# Patient Record
Sex: Female | Born: 1975 | Race: Black or African American | Hispanic: No | Marital: Single | Smoking: Former smoker
Health system: Southern US, Community
[De-identification: ages and names within clinical notes are randomized; demographics above are authoritative.]

## PROBLEM LIST (undated history)

## (undated) ENCOUNTER — Ambulatory Visit

## (undated) DIAGNOSIS — E119 Type 2 diabetes mellitus without complications: Secondary | ICD-10-CM

## (undated) DIAGNOSIS — F3112 Bipolar disorder, current episode manic without psychotic features, moderate: Secondary | ICD-10-CM

## (undated) DIAGNOSIS — E109 Type 1 diabetes mellitus without complications: Secondary | ICD-10-CM

## (undated) DIAGNOSIS — K589 Irritable bowel syndrome without diarrhea: Secondary | ICD-10-CM

## (undated) DIAGNOSIS — F319 Bipolar disorder, unspecified: Secondary | ICD-10-CM

## (undated) DIAGNOSIS — D219 Benign neoplasm of connective and other soft tissue, unspecified: Secondary | ICD-10-CM

## (undated) DIAGNOSIS — IMO0002 Reserved for concepts with insufficient information to code with codable children: Secondary | ICD-10-CM

## (undated) DIAGNOSIS — R87619 Unspecified abnormal cytological findings in specimens from cervix uteri: Secondary | ICD-10-CM

## (undated) DIAGNOSIS — I10 Essential (primary) hypertension: Secondary | ICD-10-CM

## (undated) HISTORY — PX: ESSURE TUBAL LIGATION: SUR464

## (undated) HISTORY — PX: CERVICAL BIOPSY: SHX590

## (undated) HISTORY — PX: CHOLECYSTECTOMY: SHX55

## (undated) HISTORY — PX: HIATAL HERNIA REPAIR: SHX195

## (undated) HISTORY — PX: TUBAL LIGATION: SHX77

---

## 1999-03-17 ENCOUNTER — Emergency Department (HOSPITAL_COMMUNITY): Admission: EM | Admit: 1999-03-17 | Discharge: 1999-03-17 | Payer: Self-pay | Admitting: Emergency Medicine

## 1999-03-25 ENCOUNTER — Emergency Department (HOSPITAL_COMMUNITY): Admission: EM | Admit: 1999-03-25 | Discharge: 1999-03-25 | Payer: Self-pay | Admitting: Emergency Medicine

## 1999-10-31 ENCOUNTER — Encounter: Admission: RE | Admit: 1999-10-31 | Discharge: 1999-10-31 | Payer: Self-pay

## 2000-10-06 ENCOUNTER — Emergency Department (HOSPITAL_COMMUNITY): Admission: EM | Admit: 2000-10-06 | Discharge: 2000-10-07 | Payer: Self-pay | Admitting: Emergency Medicine

## 2002-04-21 ENCOUNTER — Emergency Department (HOSPITAL_COMMUNITY): Admission: EM | Admit: 2002-04-21 | Discharge: 2002-04-21 | Payer: Self-pay | Admitting: *Deleted

## 2002-04-21 ENCOUNTER — Encounter: Payer: Self-pay | Admitting: Emergency Medicine

## 2002-04-27 ENCOUNTER — Inpatient Hospital Stay (HOSPITAL_COMMUNITY): Admission: AD | Admit: 2002-04-27 | Discharge: 2002-04-27 | Payer: Self-pay | Admitting: Obstetrics and Gynecology

## 2002-04-28 ENCOUNTER — Encounter: Payer: Self-pay | Admitting: Obstetrics and Gynecology

## 2003-01-06 ENCOUNTER — Other Ambulatory Visit: Admission: RE | Admit: 2003-01-06 | Discharge: 2003-01-06 | Payer: Self-pay | Admitting: Obstetrics and Gynecology

## 2003-01-07 ENCOUNTER — Other Ambulatory Visit: Admission: RE | Admit: 2003-01-07 | Discharge: 2003-01-07 | Payer: Self-pay | Admitting: Obstetrics & Gynecology

## 2003-07-26 ENCOUNTER — Emergency Department (HOSPITAL_COMMUNITY): Admission: AD | Admit: 2003-07-26 | Discharge: 2003-07-26 | Payer: Self-pay | Admitting: Emergency Medicine

## 2003-08-09 ENCOUNTER — Inpatient Hospital Stay (HOSPITAL_COMMUNITY): Admission: AD | Admit: 2003-08-09 | Discharge: 2003-08-11 | Payer: Self-pay | Admitting: Obstetrics and Gynecology

## 2004-09-27 ENCOUNTER — Inpatient Hospital Stay (HOSPITAL_COMMUNITY): Admission: RE | Admit: 2004-09-27 | Discharge: 2004-10-03 | Payer: Self-pay | Admitting: Psychiatry

## 2004-09-27 ENCOUNTER — Ambulatory Visit: Payer: Self-pay | Admitting: Psychiatry

## 2004-09-28 ENCOUNTER — Encounter: Payer: Self-pay | Admitting: Psychiatry

## 2005-03-09 ENCOUNTER — Emergency Department (HOSPITAL_COMMUNITY): Admission: EM | Admit: 2005-03-09 | Discharge: 2005-03-10 | Payer: Self-pay | Admitting: Emergency Medicine

## 2005-03-11 ENCOUNTER — Emergency Department (HOSPITAL_COMMUNITY): Admission: EM | Admit: 2005-03-11 | Discharge: 2005-03-12 | Payer: Self-pay | Admitting: Emergency Medicine

## 2005-03-13 ENCOUNTER — Other Ambulatory Visit: Admission: RE | Admit: 2005-03-13 | Discharge: 2005-03-13 | Payer: Self-pay | Admitting: Obstetrics and Gynecology

## 2007-01-17 ENCOUNTER — Emergency Department (HOSPITAL_COMMUNITY): Admission: EM | Admit: 2007-01-17 | Discharge: 2007-01-17 | Payer: Self-pay | Admitting: Emergency Medicine

## 2007-01-21 ENCOUNTER — Ambulatory Visit (HOSPITAL_COMMUNITY): Admission: RE | Admit: 2007-01-21 | Discharge: 2007-01-21 | Payer: Self-pay | Admitting: Obstetrics

## 2007-02-04 ENCOUNTER — Ambulatory Visit (HOSPITAL_COMMUNITY): Admission: RE | Admit: 2007-02-04 | Discharge: 2007-02-04 | Payer: Self-pay | Admitting: Obstetrics

## 2007-04-26 ENCOUNTER — Inpatient Hospital Stay (HOSPITAL_COMMUNITY): Admission: AD | Admit: 2007-04-26 | Discharge: 2007-04-26 | Payer: Self-pay | Admitting: Obstetrics

## 2007-06-16 ENCOUNTER — Inpatient Hospital Stay (HOSPITAL_COMMUNITY): Admission: AD | Admit: 2007-06-16 | Discharge: 2007-06-18 | Payer: Self-pay | Admitting: Obstetrics

## 2007-06-16 ENCOUNTER — Encounter: Payer: Self-pay | Admitting: Obstetrics

## 2007-12-31 ENCOUNTER — Emergency Department (HOSPITAL_COMMUNITY): Admission: EM | Admit: 2007-12-31 | Discharge: 2007-12-31 | Payer: Self-pay | Admitting: Family Medicine

## 2008-01-17 ENCOUNTER — Ambulatory Visit: Payer: Self-pay | Admitting: Nurse Practitioner

## 2008-01-17 DIAGNOSIS — J029 Acute pharyngitis, unspecified: Secondary | ICD-10-CM | POA: Insufficient documentation

## 2008-01-17 DIAGNOSIS — M542 Cervicalgia: Secondary | ICD-10-CM | POA: Insufficient documentation

## 2008-01-17 DIAGNOSIS — H6120 Impacted cerumen, unspecified ear: Secondary | ICD-10-CM | POA: Insufficient documentation

## 2008-01-17 LAB — CONVERTED CEMR LAB: Rapid Strep: NEGATIVE

## 2008-02-23 ENCOUNTER — Encounter: Payer: Self-pay | Admitting: Emergency Medicine

## 2008-02-24 ENCOUNTER — Ambulatory Visit: Payer: Self-pay | Admitting: Psychiatry

## 2008-02-24 ENCOUNTER — Inpatient Hospital Stay (HOSPITAL_COMMUNITY): Admission: EM | Admit: 2008-02-24 | Discharge: 2008-03-01 | Payer: Self-pay | Admitting: Psychiatry

## 2008-05-10 ENCOUNTER — Emergency Department (HOSPITAL_COMMUNITY): Admission: EM | Admit: 2008-05-10 | Discharge: 2008-05-11 | Payer: Self-pay | Admitting: Emergency Medicine

## 2008-06-10 ENCOUNTER — Ambulatory Visit: Payer: Self-pay | Admitting: Nurse Practitioner

## 2008-06-10 DIAGNOSIS — K8020 Calculus of gallbladder without cholecystitis without obstruction: Secondary | ICD-10-CM | POA: Insufficient documentation

## 2008-06-11 ENCOUNTER — Ambulatory Visit (HOSPITAL_COMMUNITY): Admission: RE | Admit: 2008-06-11 | Discharge: 2008-06-11 | Payer: Self-pay | Admitting: Internal Medicine

## 2008-06-12 DIAGNOSIS — K819 Cholecystitis, unspecified: Secondary | ICD-10-CM | POA: Insufficient documentation

## 2008-08-14 ENCOUNTER — Encounter (INDEPENDENT_AMBULATORY_CARE_PROVIDER_SITE_OTHER): Payer: Self-pay | Admitting: General Surgery

## 2008-08-14 ENCOUNTER — Ambulatory Visit (HOSPITAL_COMMUNITY): Admission: RE | Admit: 2008-08-14 | Discharge: 2008-08-15 | Payer: Self-pay | Admitting: General Surgery

## 2009-10-19 ENCOUNTER — Encounter (INDEPENDENT_AMBULATORY_CARE_PROVIDER_SITE_OTHER): Payer: Self-pay | Admitting: Family Medicine

## 2010-08-02 NOTE — Letter (Signed)
Summary: PATIENT INFORMATION & HISTORY SHEET  PATIENT INFORMATION & HISTORY SHEET   Imported By: Arta Bruce 01/20/2008 09:36:40  _____________________________________________________________________  External Attachment:    Type:   Image     Comment:   External Document

## 2010-08-02 NOTE — Assessment & Plan Note (Signed)
Summary: NEW - Pharyngitis   Vital Signs:  Patient Profile:   35 Years Old Female LMP:     12/18/2007 Height:     70.25 inches Weight:      279 pounds BMI:     39.89 BSA:     2.41 Temp:     98.2 degrees F oral Pulse rate:   96 / minute Pulse rhythm:   regular Resp:     20 per minute BP sitting:   110 / 60  (right arm) Cuff size:   regular  Pt. in pain?   yes    Location:   neck    Intensity:   4  Vitals Entered By: Levon Hedger (January 17, 2008 4:00 PM)  Menstrual History: LMP (date): 12/18/2007 LMP - Character: normal              Is Patient Diabetic? No  Does patient need assistance? Ambulation Normal Comments pt states she went to chiropractor today.   Last PAP Date 2/08   Chief Complaint:  new establish sore throat painful.  History of Present Illness:  Pt into the office to establish care. Pt has been seeing Dr. Clearance Coots - GYN.   She gave birth in 12/08. PAP up to date. Done at GYN 2 children. s/p tubal ligation  Neck pain - pt was seen in the chiropractor today. pt states that she pulled a muscle in her neck.  Heat therapy and massage done. No trauma.   Pt referred herself.  Sore Throat - itchy, cough, sore at times.  Previous hx of strep throat. -fever +ear ache - drainage  -nasal congestion symptoms recurrent for past 5-6 months. She mainly noticed in the winter months. Recent urgent care visit for similar complaints     Updated Prior Medication List: No Medications Current Allergies (reviewed today): No known allergies   Past Surgical History:    Tubal ligation   Family History:    Family History Diabetes 1st degree relative - paternal grandmother    Family History Thyroid disease - sister    Family History of Prostate CA 1st degree relative <50 - father  Social History:    2 children - natural    married    Current Smoker (5 cigs/wk)    Alcohol use-yes (social)    Drug use-no   Risk Factors:  Tobacco use:  current  Counseled to quit/cut down tobacco use:  yes Drug use:  no Caffeine use:  0 drinks per day Alcohol use:  yes Exercise:  no Seatbelt use:  100 % Sun Exposure:  occasionally  Family History Risk Factors:    Family History of MI in females < 75 years old:  no    Family History of MI in males < 15 years old:  no   Review of Systems  ENT      Complains of decreased hearing, ear discharge, earache, and sore throat.      Denies hoarseness and nasal congestion.  CV      Denies chest pain or discomfort.  Resp      Denies cough.   Physical Exam  General:     alert and overweight-appearing.   Head:     normocephalic.   Eyes:     pupils equal and pupils round.   Ears:     bil cerumen impaction prior to procedure able to visualize TM after irrigation Mouth:     tonsil enlargment +1 pharynx pink and moist.   Lungs:  normal breath sounds.   Heart:     normal rate and regular rhythm.   Abdomen:     soft, non-tender, and normal bowel sounds.   Msk:     up to exam table Neurologic:     alert & oriented X3.   Psych:     Oriented X3.      Impression & Recommendations:  Problem # 1:  PHARYNGITIS, VIRAL (ICD-462) rapid strep negative handout given Her updated medication list for this problem includes:    Ibuprofen 600 Mg Tabs (Ibuprofen) .Marland Kitchen... 1 tablet by mouth two times a day as needed  Orders: Rapid Strep (28413)   Problem # 2:  CERUMEN IMPACTION, BILATERAL (ICD-380.4) bil ears irrigated. tolerated procedure well Orders: Cerumen Impaction Removal (24401)   Complete Medication List: 1)  Ibuprofen 600 Mg Tabs (Ibuprofen) .Marland Kitchen.. 1 tablet by mouth two times a day as needed 2)  Loratadine 10 Mg Tabs (Loratadine) .Marland Kitchen.. 1 tablet by mouth daily as needed for allergies   Patient Instructions: 1)  Follow up as needed   Prescriptions: LORATADINE 10 MG  TABS (LORATADINE) 1 tablet by mouth daily as needed for allergies  #30 x 1   Entered and Authorized by:    Lehman Prom FNP   Signed by:   Lehman Prom FNP on 01/17/2008   Method used:   Print then Give to Patient   RxID:   [id number] IBUPROFEN 600 MG  TABS (IBUPROFEN) 1 tablet by mouth two times a day as needed  #30 x 0   Entered and Authorized by:   Lehman Prom FNP   Signed by:   Lehman Prom FNP on 01/17/2008   Method used:   Print then Give to Patient   RxID:   [id number]  ] Laboratory Results  Date/Time Received: January 17, 2008 5:26 PM   Other Tests  Rapid Strep: negative

## 2010-08-02 NOTE — Letter (Signed)
Summary: [facility]  [facility]   Imported By: Arta Bruce 12/31/2009 14:52:46  _____________________________________________________________________  External Attachment:    Type:   Image     Comment:   External Document

## 2010-10-18 LAB — DIFFERENTIAL
Basophils Absolute: 0 10*3/uL (ref 0.0–0.1)
Basophils Relative: 1 % (ref 0–1)
Eosinophils Absolute: 0.1 10*3/uL (ref 0.0–0.7)
Eosinophils Relative: 2 % (ref 0–5)
Lymphocytes Relative: 33 % (ref 12–46)
Lymphs Abs: 1.5 10*3/uL (ref 0.7–4.0)
Monocytes Absolute: 0.3 10*3/uL (ref 0.1–1.0)
Monocytes Relative: 7 % (ref 3–12)
Neutro Abs: 2.6 10*3/uL (ref 1.7–7.7)
Neutrophils Relative %: 58 % (ref 43–77)

## 2010-10-18 LAB — COMPREHENSIVE METABOLIC PANEL
ALT: 21 U/L (ref 0–35)
AST: 18 U/L (ref 0–37)
Albumin: 3.8 g/dL (ref 3.5–5.2)
Alkaline Phosphatase: 65 U/L (ref 39–117)
BUN: 5 mg/dL — ABNORMAL LOW (ref 6–23)
CO2: 27 mEq/L (ref 19–32)
Calcium: 9.9 mg/dL (ref 8.4–10.5)
Chloride: 107 mEq/L (ref 96–112)
Creatinine, Ser: 0.76 mg/dL (ref 0.4–1.2)
GFR calc non Af Amer: >60 mL/min (ref >60)
Glucose, Bld: 105 mg/dL — ABNORMAL HIGH (ref 70–99)
Potassium: 4.8 mEq/L (ref 3.5–5.1)
Sodium: 141 mEq/L (ref 135–145)
Total Bilirubin: 1.2 mg/dL (ref 0.3–1.2)
Total Protein: 6.5 g/dL (ref 6.0–8.3)

## 2010-10-18 LAB — CBC
HCT: 36.8 % (ref 36.0–46.0)
Hemoglobin: 12.3 g/dL (ref 12.0–15.0)
MCHC: 33.3 g/dL (ref 30.0–36.0)
MCV: 82.3 fL (ref 78.0–100.0)
Platelets: 280 10*3/uL (ref 150–400)
RBC: 4.47 MIL/uL (ref 3.87–5.11)
RDW: 13.4 % (ref 11.5–15.5)
WBC: 4.5 10*3/uL (ref 4.0–10.5)

## 2010-10-18 LAB — URINALYSIS, ROUTINE W REFLEX MICROSCOPIC
Bilirubin Urine: NEGATIVE
Glucose, UA: NEGATIVE mg/dL
Hgb urine dipstick: NEGATIVE
Ketones, ur: 15 mg/dL — AB
Nitrite: NEGATIVE
Protein, ur: NEGATIVE mg/dL
Specific Gravity, Urine: 1.026 (ref 1.005–1.030)
Urobilinogen, UA: 1 mg/dL (ref 0.0–1.0)
pH: 6 (ref 5.0–8.0)

## 2010-10-18 LAB — URINE MICROSCOPIC-ADD ON

## 2010-11-15 NOTE — Op Note (Signed)
NAME:  [name], [name]                  ACCOUNT NO.:  [id number]   MEDICAL RECORD NO.:  [id number]          PATIENT TYPE:  INP   LOCATION:  9148                          FACILITY:  [facility]   PHYSICIAN:  Charles A. Clearance Coots, M.D.DATE OF BIRTH:  04-20-1976   DATE OF PROCEDURE:  06/16/2007  DATE OF DISCHARGE:                               OPERATIVE REPORT   PREOPERATIVE DIAGNOSIS:  Desires sterilization.   POSTOPERATIVE DIAGNOSIS:  Desires sterilization.   PROCEDURE:  Bilateral partial salpingectomy.   SURGEON:  Coral Ceo, M.D.   ANESTHESIA:  Spinal.   ESTIMATED BLOOD LOSS:  Negligible.   COMPLICATIONS:  None.   SPECIMEN:  Approximately 2 cm segments of right and left fallopian tube.   DISPOSITION OF SPECIMEN:  Pathology.   OPERATION:  The patient was brought to operating room and after  satisfactory spinal anesthesia, the abdomen was prepped and draped in  the usual sterile fashion.  A small inferior incision was made with a  scalpel that was deepened down to the fascia with curved Mayo scissors  bluntly.  The fascia was grasped in the midline with Kocher forceps and  was cut transversely with curved Mayo scissors.  The fascial  incision  was extended to left and to right with the curved Mayo scissors.  The  peritoneum was entered bluntly with the right-angle retractors.  The  right fallopian tube was identified and was grasped with Babcock clamp  in the isthmic portion of the tube was followed from the cornual and to  the fimbrial end and grasped with Babcock clamp.  Knuckle of tube  beneath the Babcock clamp in the isthmic portion of tube was suture  ligated through the mesosalpinx with 0 plain catgut.  The section of  tube above the knot was excised with Metzenbaum scissors and submitted  to pathology for evaluation.  There is no active bleeding from the tubal  stumps and they were therefore placed back in the normal anatomic  position.  Same procedure was performed on the  opposite side without  complications.  The abdomen was then closed as follows.  The fascia and  peritoneum was closed as one with a continuous suture of 2-0 Vicryl.  The skin was closed with a  continuous subcuticular suture of 3-0 Monocryl.  Sterile bandage was  applied to the incision closure.  Surgical technician indicated that all  needle, sponge and instrument counts were correct.  The patient  tolerated the procedure well and was transported to recovery in  satisfactory condition.      Charles A. Clearance Coots, M.D.  Electronically Signed     CAH/MEDQ  D:  06/16/2007  T:  06/17/2007  Job:  [id number]

## 2010-11-15 NOTE — Op Note (Signed)
NAME:  [name], [name] NO.:  [id number]   MEDICAL RECORD NO.:  [id number]          PATIENT TYPE:  OIB   LOCATION:  5121                         FACILITY:  [facility]   PHYSICIAN:  Angelia Mould. Derrell Lolling, M.D.DATE OF BIRTH:  07-Jun-1976   DATE OF PROCEDURE:  DATE OF DISCHARGE:                               OPERATIVE REPORT   PREOPERATIVE DIAGNOSES:  Chronic cholecystitis with cholelithiasis.   POSTOPERATIVE DIAGNOSIS:  Chronic cholecystitis with cholelithiasis.   OPERATION PERFORMED:  Laparoscopic cholecystectomy with intraoperative  cholangiogram.   SURGEON:  Angelia Mould. Derrell Lolling, MD   FIRST ASSISTANT:  Ollen Gross. Vernell Morgans, MD   OPERATIVE INDICATIONS:  This is a 35 year old black female who has a 3-  year history of intermittent episodes of postprandial right upper  quadrant pain and fatty food intolerance.  This has been getting worse.  She sometimes has nausea and vomiting.  She was recently evaluated at  the [facility] where a gallbladder ultrasound showed a  small contracting gallbladder filled with stones, but her lab work was  all normal.  She went to [facility] and then was sent to me  in my office.  I counseled her as an outpatient.  Cholecystectomy was  scheduled and she was brought to the operating room electively.   OPERATIVE FINDINGS:  The gallbladder was chronically inflamed, had  moderate adhesions to it.  These adhesions were soft.  The gallbladder  had numerous large and small stones in it.  The wall of the gallbladder  was thin.  The cholangiogram was normal showing normal intrahepatic and  extrahepatic bile ducts, no filling defects, normal to slightly small-  caliber bile ducts, and no obstruction with good flow of contrast into  the duodenum.  She had some filmy chronic adhesions from the dome of the  right lobe of the liver to the diaphragm which were taken down.  She had  some adhesions in the left upper quadrant as well.  The  pelvis looked  normal.  The small bowel and large bowel looked normal.   OPERATIVE TECHNIQUE:  Following the induction of general endotracheal  anesthesia, the patient's abdomen was prepped and draped in a sterile  fashion.  Intravenous antibiotics were given.  The patient was  identified as the correct patient, correct procedure, and a surgical  time-out was held.  Marcaine 0.5% with epinephrine was used as a local  infiltration anesthetic.  A curved transverse incision was made at the  superior rim of the umbilicus.  The fascia was incised in the midline  and the abdominal cavity entered under direct vision.  A 10-mm Hasson  trocar was inserted and secured with a pursestring suture of 0 Vicryl.  Pneumoperitoneum was created.  Video camera was inserted with  visualization and findings as described above.  An 11-mm trocar was  placed in the subxiphoid region and two 5-mm trocars were placed in the  right upper quadrant.  The adhesions between the dome of the liver in  the right hemidiaphragm were taken down sharply with scissors.  The  gallbladder fundus  was identified and elevated.  We identified a very  prominent cystic artery going up onto the wall of the gallbladder and  associated with lymph node.  This was isolated, clamped, divided, and  ligated with multiple metal clips.  We then dissected out the peritoneum  away from the right and left lower parts of the gallbladder until we  identified a posterior branch of the cystic artery.  This was also  isolated, secured with multiple metal clips, and divided.  We then  isolated the cystic duct which was quite long.  We inserted a  cholangiogram catheter into the cystic duct and performed a  cholangiogram using C-arm.  The cholangiogram was normal as described  above.  We removed the cholangiogram catheter, secured the cystic duct  with multiple metal clips and divided it.  We then dissected the  gallbladder from its bed with  electrocautery, placed it in the specimen  bag, and removed it through the umbilical port.  The operative field and  the subphrenic space were then copiously irrigated with saline.  There  was no bleeding.  There was no bile leak whatsoever.  The irrigation  fluid was completely clear.  The trocars were removed under direct  vision.  There was no bleeding from the trocar sites.  The  pneumoperitoneum was released.  The fascia at the umbilicus was closed  with 0 Vicryl sutures.  The skin incisions were closed with subcuticular  sutures of 4-0 Monocryl and Steri-Strips.  Clean bandages were placed,  and the patient taken to recovery room in stable condition.  Estimated  blood loss was about 15-20 mL.  Complications none.  Sponge, needle, and  instrument counts were correct.      Angelia Mould. Derrell Lolling, M.D.  Electronically Signed     HMI/MEDQ  D:  08/14/2008  T:  08/15/2008  Job:  [id number]   cc:   [facility]

## 2010-11-18 NOTE — Discharge Summary (Signed)
NAMESAHALIE, BETH NO.:  [id number]   MEDICAL RECORD NO.:  [id number]          PATIENT TYPE:  IPS   LOCATION:  0405                          FACILITY:  [facility]   PHYSICIAN:  Geoffery Lyons, M.D.      DATE OF BIRTH:  [birth year]   DATE OF ADMISSION:  09/27/2004  DATE OF DISCHARGE:  10/03/2004                                 DISCHARGE SUMMARY   CHIEF COMPLAINT AND PRESENT ILLNESS:  This was the first admission to [facility] for this 35 year old married African-American female  voluntarily admitted. Referred by the patient's primary care practitioner,  the patient's gynecologist, after she presented at his office for routine  followup appointment for elective termination of pregnancy. She was grossly  disorganized, unable to state her name at the time of registration, barely  responsive although fully conscious, looking around, speech disorganized and  not making sense. She admitted to smoking marijuana the night before. The  family stated that they had never seen her like this, and she was referred  for admission.   PAST PSYCHIATRIC HISTORY:  First time at [facility]. No previous  psychiatric care.   ALCOHOL AND DRUG HISTORY:  Admitted to the use of marijuana recently before  this episode.   PAST MEDICAL HISTORY:  Status post elective termination of pregnancy.   MEDICATIONS:  None.   PHYSICAL EXAMINATION:  Physical exam performed, did not show any acute  findings.   LABORATORY DATA:  CBC within normal limits. Blood chemistry:  Potassium 3.4,  glucose 150, SGOT 25, SGPT 37, total bilirubin 1.6, TSH 0.485. Urine  pregnancy test negative. Drug screen positive for marijuana.   MENTAL STATUS EXAM:  Reveals a female who was cooperative and directable but  had a perplexed affect. Speech was __________ , normal tone and pace. Some  response latency.  Appeared to be searching for word, decreasing amount.  Mood was anxious. Thought process was  disorganized. Seemed to be internally  distracted, unable to state her name, the date, and the day or the location.  Very unreliable. Unable to answer all of the questions. She drove herself to  the hospital, so she seemed to have been able to coordinate some actions.   ADMISSION DIAGNOSES:   AXIS I:  1.  Psychotic disorder, not otherwise specified.  2.  Marijuana abuse.   AXIS II:  No diagnosis.   AXIS III:  No diagnosis.   AXIS IV:  Moderate.   AXIS V:  Global Assessment of Functioning upon admission 20/25, highest  Global Assessment of Functioning in the last year 65/70.   COURSE IN THE HOSPITAL:  She was admitted and started in individual and  group psychotherapy. She was given Ativan as needed for anxiety. She was  also given Zyprexa Zydis 5 every 6 hours as needed. Zyprexa was  discontinued, and she was placed on Geodon 40 twice a day. Initially, she  was pretty disorganized, confused. She was placed one on one for safety. She  was very vague in terms of the history, some inconsistencies. She was  somewhat reserved,  somewhat guarded. Long pauses, seemed to be responding to  internal stimuli. By March 31, she was more aware, more cognizant of what  was going on. Endorsed that she was under a lot of stress. Endorsed that she  was trying to get the tax information together; however, overwhelmed as she  was very disorganized. She endorsed that she has not been herself lately the  last few weeks. No underlying paranoia. Endorsed that she felt there was  some people behind this.  Anxious when talking about the taxes. She was able  to disclose her history of moodiness, stability, some promiscuous behaviors.  Seemed to be more stable. There was a session with the mother and sister.  Endorsed feeling tired, worrying. They did show a lot of support. She seemed  to be improving. Affect became brighter, broader. She was more on target,  more in focus. She was able to talk about some of  the issues. So, overall,  her mood improved. Her affect was brighter. There was no evidence of the  thought disorganization that she was experiencing before. We went ahead and  discharged her to outpatient therapy.   DISCHARGE DIAGNOSES:   AXIS I:  Psychotic disorder, not otherwise specified. Rule out bipolar  disorder with psychosis and marijuana abuse.   AXIS II:  No diagnosis.   AXIS III:  No diagnosis   AXIS IV:  Moderate.   AXIS V:  Global Assessment of Functioning upon discharge 50/55.   DISCHARGE MEDICATIONS:  Geodon 80 mg twice a day.   FOLLOW UP:  __________ [facility].      IL/MEDQ  D:  10/25/2004  T:  10/26/2004  Job:  [id number]

## 2010-11-18 NOTE — H&P (Signed)
[name], [name] NO.:  [id number]   MEDICAL RECORD NO.:  [id number]          PATIENT TYPE:  IPS   LOCATION:  0405                          [facility]   PHYSICIAN:  [name], M.D.      DATE OF BIRTH:  1975/10/06   DATE OF ADMISSION:  09/27/2004  DATE OF DISCHARGE:                         PSYCHIATRIC ADMISSION ASSESSMENT   IDENTIFYING INFORMATION:  This is a 35 year old married African-American  female.  This is a voluntary admission.   HISTORY OF PRESENT ILLNESS:  This patient was referred to the hospital by  the patient's primary care practitioner, [name], M.D. the patient's  gynecologist, after she presented at his office today for a routine followup  appointment after an elective termination of pregnancy within the last  couple of weeks.  At the time she presented she was grossly disorganized.  She was unable to state her name at the time of registration, variably  responsive, although she was fully conscious, looking around, speech  disorganized and not making sense.  They had asked her if she had used any  substances and she admitted to having smoked some marijuana the prior night.  They had never seen her like this and referred her for admission.  At the  time of admission she was fully alert, calm, directable but again completely  disorganized.  She admits to hearing some auditory hallucinations.  When  asked if they are giving her instructions to harm herself, she says yes.  Then went asked to specify the instructions she describes commands to  perform ADL activities such as get dressed, go to the shower and such.  She  admits to drinking several shots of alcohol in the evenings and does endorse  smoking some marijuana.  She appears internally stimulated.   PAST PSYCHIATRIC HISTORY:  This is the first known admission to [facility], although the staff state that they have recognized  her we are unable to locate any  prior history in the records, and Dr. Tenny Craw,  who we have contacted, has no prior history of any psychiatric care.   SOCIAL HISTORY:  Patient endorses being married but is unable to give any  other history.  She has no children that her physician is aware of.  Apparently no legal problems.   FAMILY HISTORY:  Not available.   ALCOHOL OR DRUG HISTORY:  As noted above.   MEDICAL HISTORY:  Patient is followed by Dr. Miguel [name].   MEDICAL PROBLEMS:  Includes status post elective termination of pregnancy.   PAST MEDICAL HISTORY:  Remarkable for history of sexually transmitted  diseases.  She has a history of taking Paxil at one point in the past,  denies that she is currently taking it.  She denies seizures or other  surgeries.  She denies other hospitalizations.   MEDICATIONS:  None known.   DRUG ALLERGIES:  She denies.   POSITIVE PHYSICAL FINDINGS:  Because of her psychosis she tolerates only a  limited physical exam.  LUNGS:  Clear to auscultation.  CARDIOVASCULAR:  S1 and S2 are heard, regular  rate and rhythm for 1 full  minute, apical pulse synchronous with radial pulse.  VITAL SIGNS:  At the time of admission she is 5 feet 11 inches tall, weight  262 lbs, blood pressure 138/96, respirations 20.  She is afebrile with  temperature of 98.3.  NEUROLOGIC:  Reveals cranial nerves II-XII are intact.  Positive facial and  motor symmetry are present.  Grip strengths are equal bilaterally.  Her gait  is normal with normal arm swing, no tremor, flushing or nystagmus.  No signs  of substance withdrawal.  Neuro is nonfocal.   DIAGNOSTIC STUDIES:  Labs are currently pending.   MENTAL STATUS EXAM:  This is a fully alert female who is cooperative,  directable with a perplexed affect.  Speech is irrelevant, normal tone,  normal pace.  She does have considerable response latency.  She appeared to  be searching for words.  Decreased in amount.  Mood is euthymic.  Thought  process disorganized.   She does appear to be internally distracted.  Cognitively, she is unable to state her name, the date, the day or the  location, although there is no evidence of overt suicidal or homicidal  thought.  She is an unreliable historian.  Specifically during conversation,  she is asked what doctor's office she was at this morning.  She responded  after a long pause.  She begins to say numbers as if she is giving an  address.  When asked what day it is, she looks around, stammers and is  unable to answer.  It is noted that she did drive herself over here, so was  able to follow directions.   IMPRESSION:   AXIS I:  1.  Psychosis, not otherwise specified, rule out alcohol abuse and      dependence.  2.  Cannabis abuse.   AXIS II:  No diagnosis.   AXIS III:  No diagnosis.   AXIS IV:  Deferred.   AXIS V:  15-20, past year 65 estimated.   PLAN:  Voluntarily admit the patient with every 15 minutes checks in place.  We have admitted her to our intensive care unit and will place her on one-to-  one observation because of her disabled thought processing.  We will obtain  a CT scan of her brain with and without contrast medium.  We have started  her on 1 mg of Ativan p.o. now then q.4h. p.r.n. for agitation.  We are  placing her on __________ q.6h. while awake, and we will give her Zyprexa  Zydis 5 mg q.h.s. and q.6h. runny nose for hallucinations or agitation not  to exceed 20 mg in 24 hours.  Meanwhile we will complete basic labs and get  an acetaminophen and a salicylate level, basic electrolytes and we ill go  from there, and attempt to contact her family.  Estimated length of stay is  5 days.     MAS/MEDQ  D:  09/28/2004  T:  09/28/2004  Job:  [id number]

## 2010-11-18 NOTE — Discharge Summary (Signed)
NAMELENAY, LOVEJOY NO.:  [id number]   MEDICAL RECORD NO.:  [id number]          PATIENT TYPE:  IPS   LOCATION:  0400                          FACILITY:  [facility]   PHYSICIAN:  Anselm Jungling, MD  DATE OF BIRTH:  03-05-1976   DATE OF ADMISSION:  02/24/2008  DATE OF DISCHARGE:  03/01/2008                               DISCHARGE SUMMARY   IDENTIFYING DATA AND REASON FOR ADMISSION:  The patient is a 35 year old  married African American female admitted with symptoms of psychosis.  Please refer to the admission note for further details pertaining to the  symptoms, circumstances, and history that led to her hospitalization.  She was given an initial Axis I diagnosis of psychosis NOS.   MEDICAL AND LABORATORY:  The patient was medically and physically  assessed by the psychiatric nurse practitioner.  She was in good health  without any active or chronic medical problems.  She was treated with  Protonix for symptoms of GERD.   HOSPITAL COURSE:  The patient was admitted to the adult inpatient  psychiatric service.  She presented as a well-nourished, normally-  developed adult female who was initially highly disorganized, agitated,  and required one-to-one staffing.  She stated correctly that she was at  [facility], and stated I flipped out.  However, she made no  delusional statements.  Her disorganized responses were quite latent as  well.  Her affect was blunted.   We endeavor to learn more information about her history and the  circumstances that led to her hospitalization.  She was treated with a  psychotropic regimen of Zyprexa, with Ambien as needed for sleep.   The patient was generally pleasant and cooperative, and she agreed to  become involved in the therapeutic milieu, attending various therapeutic  groups and activities.   The patient reported both visual and auditory hallucinations.  She did  not present with classic signs and symptoms of  psychosis, and as such,  there was some impression that there may have been some degree of  elaboration of her symptoms.   On the seventh hospital day, there was a family session involving the  patient's husband.  At that time, the patient reported that she was  still feeling somewhat confused and anxious, but better than when she  had been admitted.  Husband reported that the patient's mental state had  become altered about 3 days prior to admission, but he had no sense of  what triggered the change.  The patient also indicated that she was  unsure about this because she had been compliant with her medication for  followup care following her past admission.  However, she stated that  she had stopped taking her medication because she had felt better.  Husband encouraged her strongly in the meeting to remain adherent to her  medication regimen.   Aftercare and safety concerns were discussed at length.  The husband  indicated that he felt comfortable with her discharge.  It appeared that  she had benefited much as possible from the inpatient program, and  appeared sincerely committed to  outpatient followup and aftercare.   AFTERCARE:  The patient was to follow up at the [facility] with an  appointment with their intake worker on March 04, 2008.   DISCHARGE MEDICATIONS:  1. Zyprexa Zydis 10 mg nightly.  2. Ambien 10 mg at bedtime only as needed for sleep.  3. Protonix 40 mg daily.   DISCHARGE DIAGNOSES:  AXIS I:  Psychosis NOS.  AXIS II:  Deferred.  AXIS III:  Gastroesophageal reflux disease.  AXIS IV:  Stressors severe.  AXIS V:  GAF on discharge 55.      Anselm Jungling, MD  Electronically Signed     SPB/MEDQ  D:  03/12/2008  T:  03/12/2008  Job:  [id number]

## 2011-04-04 LAB — DIFFERENTIAL
Basophils Absolute: 0
Basophils Relative: 0
Eosinophils Absolute: 0.1
Eosinophils Relative: 1
Lymphocytes Relative: 17
Lymphs Abs: 1.7
Monocytes Absolute: 0.8
Monocytes Relative: 8
Neutro Abs: 7.7
Neutrophils Relative %: 75

## 2011-04-04 LAB — POCT PREGNANCY, URINE

## 2011-04-04 LAB — CBC
HCT: 38.6
Hemoglobin: 12.5
MCHC: 32.4
MCV: 82.7
Platelets: 291
RBC: 4.67
RDW: 13.3
WBC: 10.3

## 2011-04-04 LAB — URINALYSIS, ROUTINE W REFLEX MICROSCOPIC
Bilirubin Urine: NEGATIVE
Glucose, UA: NEGATIVE
Hgb urine dipstick: NEGATIVE
Ketones, ur: NEGATIVE
Nitrite: NEGATIVE
Protein, ur: NEGATIVE
Specific Gravity, Urine: 1.03
Urobilinogen, UA: 1
pH: 6

## 2011-04-04 LAB — COMPREHENSIVE METABOLIC PANEL
ALT: 15
AST: 16
Albumin: 3.9
Alkaline Phosphatase: 66
BUN: 12
CO2: 24
Calcium: 9.6
Chloride: 107
Creatinine, Ser: 0.8
GFR calc non Af Amer: >60
Glucose, Bld: 118 — ABNORMAL HIGH
Potassium: 4.1
Sodium: 138
Total Bilirubin: 0.8
Total Protein: 7.3

## 2011-04-04 LAB — LIPASE, BLOOD: Lipase: 30

## 2011-04-07 LAB — CBC
HCT: 28.6 — ABNORMAL LOW
Hemoglobin: 9.6 — ABNORMAL LOW
MCHC: 33.5
MCV: 84.6
Platelets: 151
RBC: 3.38 — ABNORMAL LOW
RDW: 13.8
WBC: 9.3

## 2011-04-10 LAB — RPR: RPR Ser Ql: NONREACTIVE

## 2011-04-10 LAB — CBC
HCT: 33.7 — ABNORMAL LOW
Hemoglobin: 11.5 — ABNORMAL LOW
MCHC: 34
MCV: 84
Platelets: 199
RBC: 4.01
RDW: 13.9
WBC: 9.8

## 2011-04-12 LAB — WET PREP, GENITAL
Clue Cells Wet Prep HPF POC: NONE SEEN
Trich, Wet Prep: NONE SEEN
Yeast Wet Prep HPF POC: NONE SEEN

## 2011-04-12 LAB — URINALYSIS, ROUTINE W REFLEX MICROSCOPIC
Bilirubin Urine: NEGATIVE
Glucose, UA: NEGATIVE
Hgb urine dipstick: NEGATIVE
Ketones, ur: 15 — AB
Nitrite: NEGATIVE
Protein, ur: NEGATIVE
Specific Gravity, Urine: 1.025
Urobilinogen, UA: 1
pH: 6

## 2011-04-17 LAB — I-STAT 8, (EC8 V) (CONVERTED LAB)
Acid-base deficit: 2
BUN: 6
Bicarbonate: 24
Chloride: 104
Glucose, Bld: 88
HCT: 36
Hemoglobin: 12.2
Operator id: 146091
Potassium: 4.2
Sodium: 135
TCO2: 25
pCO2, Ven: 43.1 — ABNORMAL LOW
pH, Ven: 7.353 — ABNORMAL HIGH

## 2011-04-17 LAB — POCT CARDIAC MARKERS
CKMB, poc: <1 — ABNORMAL LOW
Myoglobin, poc: 110
Operator id: 272551
Troponin i, poc: <0.05

## 2011-04-17 LAB — POCT I-STAT CREATININE
Creatinine, Ser: 0.8
Operator id: 146091

## 2011-04-20 ENCOUNTER — Encounter (HOSPITAL_COMMUNITY): Payer: Self-pay | Admitting: *Deleted

## 2011-04-20 ENCOUNTER — Inpatient Hospital Stay (HOSPITAL_COMMUNITY): Payer: Self-pay

## 2011-04-20 ENCOUNTER — Inpatient Hospital Stay (HOSPITAL_COMMUNITY)
Admission: AD | Admit: 2011-04-20 | Discharge: 2011-04-20 | Disposition: A | Discharge status: Home / Self Care | Payer: Self-pay | Source: Ambulatory Visit | Attending: Family Medicine | Admitting: Family Medicine

## 2011-04-20 DIAGNOSIS — R1032 Left lower quadrant pain: Secondary | ICD-10-CM | POA: Insufficient documentation

## 2011-04-20 DIAGNOSIS — K589 Irritable bowel syndrome without diarrhea: Secondary | ICD-10-CM

## 2011-04-20 DIAGNOSIS — R102 Pelvic and perineal pain unspecified side: Secondary | ICD-10-CM

## 2011-04-20 DIAGNOSIS — R109 Unspecified abdominal pain: Secondary | ICD-10-CM

## 2011-04-20 DIAGNOSIS — N949 Unspecified condition associated with female genital organs and menstrual cycle: Secondary | ICD-10-CM

## 2011-04-20 HISTORY — DX: Benign neoplasm of connective and other soft tissue, unspecified: D21.9

## 2011-04-20 HISTORY — DX: Unspecified abnormal cytological findings in specimens from cervix uteri: R87.619

## 2011-04-20 HISTORY — DX: Reserved for concepts with insufficient information to code with codable children: IMO0002

## 2011-04-20 HISTORY — DX: Bipolar disorder, unspecified: F31.9

## 2011-04-20 LAB — URINALYSIS, ROUTINE W REFLEX MICROSCOPIC
Bilirubin Urine: NEGATIVE
Glucose, UA: NEGATIVE mg/dL
Ketones, ur: NEGATIVE mg/dL
Nitrite: NEGATIVE
Protein, ur: NEGATIVE mg/dL
Specific Gravity, Urine: 1.025 (ref 1.005–1.030)
Urobilinogen, UA: 0.2 mg/dL (ref 0.0–1.0)
pH: 6 (ref 5.0–8.0)

## 2011-04-20 LAB — COMPREHENSIVE METABOLIC PANEL
ALT: 22 U/L (ref 0–35)
AST: 16 U/L (ref 0–37)
Albumin: 3.7 g/dL (ref 3.5–5.2)
Alkaline Phosphatase: 80 U/L (ref 39–117)
BUN: 9 mg/dL (ref 6–23)
CO2: 30 mEq/L (ref 19–32)
Calcium: 9.9 mg/dL (ref 8.4–10.5)
Chloride: 102 mEq/L (ref 96–112)
Creatinine, Ser: 0.8 mg/dL (ref 0.50–1.10)
GFR calc Af Amer: >90 mL/min (ref >90)
GFR calc non Af Amer: >90 mL/min (ref >90)
Glucose, Bld: 108 mg/dL — ABNORMAL HIGH (ref 70–99)
Potassium: 4.4 mEq/L (ref 3.5–5.1)
Sodium: 138 mEq/L (ref 135–145)
Total Bilirubin: 0.7 mg/dL (ref 0.3–1.2)
Total Protein: 7 g/dL (ref 6.0–8.3)

## 2011-04-20 LAB — WET PREP, GENITAL
Clue Cells Wet Prep HPF POC: NONE SEEN
Trich, Wet Prep: NONE SEEN
Yeast Wet Prep HPF POC: NONE SEEN

## 2011-04-20 LAB — URINE MICROSCOPIC-ADD ON

## 2011-04-20 LAB — CBC
HCT: 36.9 % (ref 36.0–46.0)
Hemoglobin: 12.1 g/dL (ref 12.0–15.0)
MCH: 27.1 pg (ref 26.0–34.0)
MCHC: 32.8 g/dL (ref 30.0–36.0)
MCV: 82.6 fL (ref 78.0–100.0)
Platelets: 266 10*3/uL (ref 150–400)
RBC: 4.47 MIL/uL (ref 3.87–5.11)
RDW: 14.2 % (ref 11.5–15.5)
WBC: 6.4 10*3/uL (ref 4.0–10.5)

## 2011-04-20 LAB — PREGNANCY, URINE: Preg Test, Ur: NEGATIVE

## 2011-04-20 MED ORDER — DICYCLOMINE HCL 20 MG PO TABS: 20.0000 mg | ORAL_TABLET | Freq: Four times a day (QID) | ORAL | Status: DC | PRN

## 2011-04-20 MED ORDER — DICYCLOMINE HCL 20 MG PO TABS
20.0000 mg | ORAL_TABLET | ORAL | Status: DC
  Filled 2011-04-20 (×2): qty 1

## 2011-04-20 MED ORDER — DICYCLOMINE HCL 10 MG PO CAPS
20.0000 mg | ORAL_CAPSULE | Freq: Once | ORAL | Status: AC
  Administered 2011-04-20: 20 mg via ORAL
  Filled 2011-04-20: qty 2

## 2011-04-20 MED ORDER — IBUPROFEN 600 MG PO TABS: 600.0000 mg | ORAL_TABLET | Freq: Four times a day (QID) | ORAL | Status: AC | PRN

## 2011-04-20 MED ORDER — KETOROLAC TROMETHAMINE 60 MG/2ML IM SOLN
60.0000 mg | Freq: Once | INTRAMUSCULAR | Status: AC
  Administered 2011-04-20: 60 mg via INTRAMUSCULAR
  Filled 2011-04-20: qty 2

## 2011-04-20 NOTE — ED Provider Notes (Signed)
Chart reviewed and agree with management and plan.  

## 2011-04-20 NOTE — ED Provider Notes (Addendum)
History     No chief complaint on file.  HPI  Pt is not pregnant and presents with abdominal pain for about 2 months. She has cramping type pain continuously.  She had a tubal ligation in 2008 by Dr. Clearance Coots and has not been seen in their office since that time due to insurance and was referred here.  The pain is day and night.  She also has some constipation and also some diarrhea.  The pain is sometimes worse after eating.  Pt has a history of cholecystectomy in ?2010. She has had some nausea from time to time but no vomiting.  She denies vaginal discharge but has some LLQ pain after intercourse-sharp stinging pain.  The pain is not worse when she walks.  Pt's pain is not worse with walking.  Pt denies pain with urination.    Past Medical History  Diagnosis Date  . Abnormal Pap smear   . Fibroids   . Bipolar 1 disorder     Past Surgical History  Procedure Date  . Tubal ligation   . Cervical biopsy     No family history on file.  History  Substance Use Topics  . Smoking status: Former Smoker -- 0.2 packs/day for 0 years  . Smokeless tobacco: Not on file  . Alcohol Use: No    Allergies: Allergies not on file  No prescriptions prior to admission    Review of Systems  Constitutional: Negative for fever and chills.  Gastrointestinal: Positive for nausea, abdominal pain, diarrhea and constipation. Negative for heartburn and vomiting.  Genitourinary: Negative for dysuria, urgency and frequency.   Physical Exam   Blood pressure 132/74, pulse 66, temperature 98.8 F (37.1 C), temperature source Oral, resp. rate 18, height 5\' 11"  (1.803 m), weight 250 lb (113.399 kg), last menstrual period 04/07/2011.  Physical Exam  Vitals reviewed. Constitutional: She is oriented to person, place, and time. She appears well-developed and well-nourished.  HENT:  Head: Normocephalic.  Eyes: Pupils are equal, round, and reactive to light.  Neck: Normal range of motion. Neck supple.    Cardiovascular: Normal rate.   Respiratory: Effort normal.  GI: Soft. She exhibits no distension. There is no tenderness. There is no rebound and no guarding.  Genitourinary:       BUS negative- mod amount of frothy white discharge in vault; cervix clean; uterus and adnexal without palpable enlargement- diffuse tenderness- exam complicated by habitus  Musculoskeletal: Normal range of motion.  Neurological: She is alert and oriented to person, place, and time.  Skin: Skin is warm and dry.  Psychiatric: She has a normal mood and affect.    MAU Course  Procedures CBC,CMET,urinalysis, pelvic ultrasound, wet prep- all within normal limits UPT-neg GC/Chlamydia- pending Toradol 60mg  IM given Bentyl 20 mg PO given   Assessment and Plan  Abdominal pain Suspicious for IBS Will give pt a prescription for Bentyl 20 mg 1-2 tablets QID prn pain Pt information and diet for IBS Pt wants prescription for Ibuprofen 800 mg as well If symptoms worsen, advised pt to follow up with PCP or [facility] ED  Bethanie Bloxom 04/20/2011, 1:05 PM

## 2011-04-21 LAB — GC/CHLAMYDIA PROBE AMP, GENITAL
Chlamydia, DNA Probe: NEGATIVE
GC Probe Amp, Genital: NEGATIVE

## 2012-09-14 ENCOUNTER — Encounter (HOSPITAL_COMMUNITY): Payer: Self-pay | Admitting: Emergency Medicine

## 2012-09-14 DIAGNOSIS — Z79899 Other long term (current) drug therapy: Secondary | ICD-10-CM | POA: Insufficient documentation

## 2012-09-14 DIAGNOSIS — R5381 Other malaise: Secondary | ICD-10-CM | POA: Insufficient documentation

## 2012-09-14 DIAGNOSIS — Z87891 Personal history of nicotine dependence: Secondary | ICD-10-CM | POA: Insufficient documentation

## 2012-09-14 DIAGNOSIS — Z3202 Encounter for pregnancy test, result negative: Secondary | ICD-10-CM | POA: Insufficient documentation

## 2012-09-14 DIAGNOSIS — Z8719 Personal history of other diseases of the digestive system: Secondary | ICD-10-CM | POA: Insufficient documentation

## 2012-09-14 DIAGNOSIS — R109 Unspecified abdominal pain: Secondary | ICD-10-CM | POA: Insufficient documentation

## 2012-09-14 DIAGNOSIS — Z9889 Other specified postprocedural states: Secondary | ICD-10-CM | POA: Insufficient documentation

## 2012-09-14 DIAGNOSIS — Z9089 Acquired absence of other organs: Secondary | ICD-10-CM | POA: Insufficient documentation

## 2012-09-14 DIAGNOSIS — M549 Dorsalgia, unspecified: Secondary | ICD-10-CM | POA: Insufficient documentation

## 2012-09-14 DIAGNOSIS — F319 Bipolar disorder, unspecified: Secondary | ICD-10-CM | POA: Insufficient documentation

## 2012-09-14 DIAGNOSIS — R197 Diarrhea, unspecified: Secondary | ICD-10-CM | POA: Insufficient documentation

## 2012-09-14 DIAGNOSIS — R112 Nausea with vomiting, unspecified: Secondary | ICD-10-CM | POA: Insufficient documentation

## 2012-09-14 DIAGNOSIS — Z9851 Tubal ligation status: Secondary | ICD-10-CM | POA: Insufficient documentation

## 2012-09-14 DIAGNOSIS — B9789 Other viral agents as the cause of diseases classified elsewhere: Secondary | ICD-10-CM | POA: Insufficient documentation

## 2012-09-14 DIAGNOSIS — R509 Fever, unspecified: Secondary | ICD-10-CM | POA: Insufficient documentation

## 2012-09-14 DIAGNOSIS — Z8742 Personal history of other diseases of the female genital tract: Secondary | ICD-10-CM | POA: Insufficient documentation

## 2012-09-14 LAB — CBC WITH DIFFERENTIAL/PLATELET
Basophils Absolute: 0 10*3/uL (ref 0.0–0.1)
Basophils Relative: 0 % (ref 0–1)
Eosinophils Absolute: 0 10*3/uL (ref 0.0–0.7)
Eosinophils Relative: 0 % (ref 0–5)
HCT: 39.9 % (ref 36.0–46.0)
Hemoglobin: 13.9 g/dL (ref 12.0–15.0)
Lymphocytes Relative: 5 % — ABNORMAL LOW (ref 12–46)
Lymphs Abs: 0.6 10*3/uL — ABNORMAL LOW (ref 0.7–4.0)
MCH: 28.3 pg (ref 26.0–34.0)
MCHC: 34.8 g/dL (ref 30.0–36.0)
MCV: 81.1 fL (ref 78.0–100.0)
Monocytes Absolute: 0.4 10*3/uL (ref 0.1–1.0)
Monocytes Relative: 3 % (ref 3–12)
Neutro Abs: 12.1 10*3/uL — ABNORMAL HIGH (ref 1.7–7.7)
Neutrophils Relative %: 92 % — ABNORMAL HIGH (ref 43–77)
Platelets: 290 10*3/uL (ref 150–400)
RBC: 4.92 MIL/uL (ref 3.87–5.11)
RDW: 13.5 % (ref 11.5–15.5)
WBC: 13.2 10*3/uL — ABNORMAL HIGH (ref 4.0–10.5)

## 2012-09-14 MED ORDER — ONDANSETRON 4 MG PO TBDP
ORAL_TABLET | ORAL | Status: AC
  Filled 2012-09-14: qty 1

## 2012-09-14 MED ORDER — ONDANSETRON 4 MG PO TBDP
4.0000 mg | ORAL_TABLET | Freq: Once | ORAL | Status: AC
  Administered 2012-09-14: 4 mg via ORAL

## 2012-09-14 NOTE — ED Notes (Signed)
Pt to ED with c/o lower abd cramping, nausea, vomiting and diarrhea.  Onset this am

## 2012-09-15 ENCOUNTER — Emergency Department (HOSPITAL_COMMUNITY)
Admission: EM | Admit: 2012-09-15 | Discharge: 2012-09-15 | Disposition: A | Discharge status: Home / Self Care | Payer: Self-pay | Attending: Emergency Medicine | Admitting: Emergency Medicine

## 2012-09-15 DIAGNOSIS — B349 Viral infection, unspecified: Secondary | ICD-10-CM

## 2012-09-15 DIAGNOSIS — R112 Nausea with vomiting, unspecified: Secondary | ICD-10-CM

## 2012-09-15 HISTORY — DX: Irritable bowel syndrome, unspecified: K58.9

## 2012-09-15 LAB — URINALYSIS, ROUTINE W REFLEX MICROSCOPIC
Bilirubin Urine: NEGATIVE
Glucose, UA: NEGATIVE mg/dL
Hgb urine dipstick: NEGATIVE
Ketones, ur: 40 mg/dL — AB
Leukocytes, UA: NEGATIVE
Nitrite: NEGATIVE
Protein, ur: NEGATIVE mg/dL
Specific Gravity, Urine: 1.036 — ABNORMAL HIGH (ref 1.005–1.030)
Urobilinogen, UA: 0.2 mg/dL (ref 0.0–1.0)
pH: 6 (ref 5.0–8.0)

## 2012-09-15 LAB — COMPREHENSIVE METABOLIC PANEL
ALT: 31 U/L (ref 0–35)
AST: 20 U/L (ref 0–37)
Albumin: 4.1 g/dL (ref 3.5–5.2)
Alkaline Phosphatase: 84 U/L (ref 39–117)
BUN: 11 mg/dL (ref 6–23)
CO2: 21 mEq/L (ref 19–32)
Calcium: 9.9 mg/dL (ref 8.4–10.5)
Chloride: 102 mEq/L (ref 96–112)
Creatinine, Ser: 0.77 mg/dL (ref 0.50–1.10)
GFR calc Af Amer: >90 mL/min (ref >90)
GFR calc non Af Amer: >90 mL/min (ref >90)
Glucose, Bld: 201 mg/dL — ABNORMAL HIGH (ref 70–99)
Potassium: 5.4 mEq/L — ABNORMAL HIGH (ref 3.5–5.1)
Sodium: 135 mEq/L (ref 135–145)
Total Bilirubin: 0.9 mg/dL (ref 0.3–1.2)
Total Protein: 8.1 g/dL (ref 6.0–8.3)

## 2012-09-15 LAB — POCT PREGNANCY, URINE: Preg Test, Ur: NEGATIVE

## 2012-09-15 MED ORDER — METOCLOPRAMIDE HCL 5 MG/ML IJ SOLN
10.0000 mg | Freq: Once | INTRAMUSCULAR | Status: AC
  Administered 2012-09-15: 10 mg via INTRAVENOUS
  Filled 2012-09-15: qty 2

## 2012-09-15 MED ORDER — SODIUM CHLORIDE 0.9 % IV BOLUS (SEPSIS)
1000.0000 mL | Freq: Once | INTRAVENOUS | Status: AC
  Administered 2012-09-15: 1000 mL via INTRAVENOUS

## 2012-09-15 MED ORDER — DIPHENHYDRAMINE HCL 50 MG/ML IJ SOLN
25.0000 mg | Freq: Once | INTRAMUSCULAR | Status: AC
  Administered 2012-09-15: 06:00:00 via INTRAVENOUS
  Filled 2012-09-15: qty 1

## 2012-09-15 MED ORDER — PROMETHAZINE HCL 25 MG/ML IJ SOLN
25.0000 mg | Freq: Once | INTRAMUSCULAR | Status: AC
  Administered 2012-09-15: 25 mg via INTRAVENOUS
  Filled 2012-09-15 (×2): qty 1

## 2012-09-15 MED ORDER — ONDANSETRON HCL 4 MG/2ML IJ SOLN
4.0000 mg | Freq: Once | INTRAMUSCULAR | Status: AC
  Administered 2012-09-15: 4 mg via INTRAVENOUS

## 2012-09-15 MED ORDER — PROMETHAZINE HCL 25 MG PO TABS: 25.0000 mg | ORAL_TABLET | Freq: Four times a day (QID) | ORAL | Status: DC | PRN

## 2012-09-15 MED ORDER — GI COCKTAIL ~~LOC~~
30.0000 mL | Freq: Once | ORAL | Status: AC
  Administered 2012-09-15: 30 mL via ORAL
  Filled 2012-09-15 (×2): qty 30

## 2012-09-15 MED ORDER — ONDANSETRON HCL 4 MG/2ML IJ SOLN
4.0000 mg | Freq: Once | INTRAMUSCULAR | Status: AC
  Administered 2012-09-15: 4 mg via INTRAVENOUS
  Filled 2012-09-15: qty 2

## 2012-09-15 NOTE — ED Provider Notes (Signed)
History     CSN: [id number]  Arrival date & time 09/14/12  2256   First MD Initiated Contact with Patient 09/15/12 0047      Chief Complaint  Patient presents with  . Abdominal Pain   HPI  History provided by the patient. Patient is a 37 year old female who presents with complaints of acute onset nausea, vomiting diarrhea symptoms. Symptoms first began with feeling slightly nauseous followed by watery diarrhea around 10 AM. Diarrhea is without blood or mucus. Patient later had episodes of vomiting as well followed by abdominal cramping. Patient continued to feel poorly all day with increased fatigue and some subjective fevers and chills. She has not used any medications for her symptoms. She has been able to sip on some fluids but has not had much of an appetite. She denies any known sick contacts but does work with the public at [employer]. She has not traveled recently. Denies any other aggravating or alleviating factors.     Past Medical History  Diagnosis Date  . Abnormal Pap smear   . Fibroids   . Bipolar 1 disorder   . IBS (irritable bowel syndrome)     Past Surgical History  Procedure Laterality Date  . Tubal ligation    . Cervical biopsy    . Cholecystectomy      No family history on file.  History  Substance Use Topics  . Smoking status: Former Smoker -- 0.25 packs/day for 0 years  . Smokeless tobacco: Not on file  . Alcohol Use: No    OB History   Grav Para Term Preterm Abortions TAB SAB Ect Mult Living   7 2 2  5 4  1  2       Review of Systems  Constitutional: Negative for fever, chills and unexpected weight change.  HENT: Negative for neck pain.   Respiratory: Negative for cough.   Cardiovascular: Negative for chest pain.  Gastrointestinal: Positive for nausea, vomiting, abdominal pain and diarrhea.  Genitourinary: Negative for dysuria, frequency, hematuria and flank pain.  Musculoskeletal: Positive for back pain.  Skin: Negative for rash.   Neurological: Negative for weakness and numbness.  All other systems reviewed and are negative.    Allergies  Review of patient's allergies indicates no known allergies.  Home Medications   Current Outpatient Rx  Name  Route  Sig  Dispense  Refill  . ziprasidone (GEODON) 80 MG capsule   Oral   Take 80 mg by mouth daily.           Marland Kitchen zolpidem (AMBIEN) 10 MG tablet   Oral   Take 10 mg by mouth at bedtime.           BP 123/70  Pulse 59  Temp(Src) 96.6 F (35.9 C) (Oral)  Resp 18  SpO2 100%  LMP 09/04/2012  Physical Exam  Nursing note and vitals reviewed. Constitutional: She is oriented to person, place, and time. She appears well-developed and well-nourished. No distress.  HENT:  Head: Normocephalic.  Cardiovascular: Normal rate and regular rhythm.   Pulmonary/Chest: Effort normal and breath sounds normal. No respiratory distress.  Abdominal: Soft. She exhibits no distension. There is no tenderness. There is no rebound and no guarding.  Musculoskeletal: Normal range of motion.  Neurological: She is alert and oriented to person, place, and time.  Skin: Skin is warm and dry. No rash noted.  Psychiatric: She has a normal mood and affect. Her behavior is normal.    ED Course  Procedures  Results for orders placed during the hospital encounter of 09/15/12  CBC WITH DIFFERENTIAL      Result Value Range   WBC 13.2 (*) 4.0 - 10.5 K/uL   RBC 4.92  3.87 - 5.11 MIL/uL   Hemoglobin 13.9  12.0 - 15.0 g/dL   HCT 40.9  81.1 - 91.4 %   MCV 81.1  78.0 - 100.0 fL   MCH 28.3  26.0 - 34.0 pg   MCHC 34.8  30.0 - 36.0 g/dL   RDW 78.2  95.6 - 21.3 %   Platelets 290  150 - 400 K/uL   Neutrophils Relative 92 (*) 43 - 77 %   Neutro Abs 12.1 (*) 1.7 - 7.7 K/uL   Lymphocytes Relative 5 (*) 12 - 46 %   Lymphs Abs 0.6 (*) 0.7 - 4.0 K/uL   Monocytes Relative 3  3 - 12 %   Monocytes Absolute 0.4  0.1 - 1.0 K/uL   Eosinophils Relative 0  0 - 5 %   Eosinophils Absolute 0.0  0.0 -  0.7 K/uL   Basophils Relative 0  0 - 1 %   Basophils Absolute 0.0  0.0 - 0.1 K/uL  COMPREHENSIVE METABOLIC PANEL      Result Value Range   Sodium 135  135 - 145 mEq/L   Potassium 5.4 (*) 3.5 - 5.1 mEq/L   Chloride 102  96 - 112 mEq/L   CO2 21  19 - 32 mEq/L   Glucose, Bld 201 (*) 70 - 99 mg/dL   BUN 11  6 - 23 mg/dL   Creatinine, Ser 0.86  0.50 - 1.10 mg/dL   Calcium 9.9  8.4 - 57.8 mg/dL   Total Protein 8.1  6.0 - 8.3 g/dL   Albumin 4.1  3.5 - 5.2 g/dL   AST 20  0 - 37 U/L   ALT 31  0 - 35 U/L   Alkaline Phosphatase 84  39 - 117 U/L   Total Bilirubin 0.9  0.3 - 1.2 mg/dL   GFR calc non Af Amer >90  >90 mL/min   GFR calc Af Amer >90  >90 mL/min  URINALYSIS, ROUTINE W REFLEX MICROSCOPIC      Result Value Range   Color, Urine YELLOW  YELLOW   APPearance CLOUDY (*) CLEAR   Specific Gravity, Urine 1.036 (*) 1.005 - 1.030   pH 6.0  5.0 - 8.0   Glucose, UA NEGATIVE  NEGATIVE mg/dL   Hgb urine dipstick NEGATIVE  NEGATIVE   Bilirubin Urine NEGATIVE  NEGATIVE   Ketones, ur 40 (*) NEGATIVE mg/dL   Protein, ur NEGATIVE  NEGATIVE mg/dL   Urobilinogen, UA 0.2  0.0 - 1.0 mg/dL   Nitrite NEGATIVE  NEGATIVE   Leukocytes, UA NEGATIVE  NEGATIVE  POCT PREGNANCY, URINE      Result Value Range   Preg Test, Ur NEGATIVE  NEGATIVE        1. Nausea vomiting and diarrhea   2. Viral syndrome       MDM  12:45AM patient seen and evaluated. Patient appears in some discomfort but otherwise nontoxic and in no acute distress. Symptoms are consistent with a viral GI infection.  Patient feeling better after IV fluids and treatment. She is tolerating by mouth fluids. At this time will discharge with symptomatic treatment.    Angus Seller, PA-C 09/15/12 2131

## 2012-09-30 NOTE — ED Provider Notes (Signed)
Medical screening examination/treatment/procedure(s) were performed by non-physician practitioner and as supervising physician I was immediately available for consultation/collaboration.  Brandt Loosen, MD 09/30/12 [phone]

## 2013-07-24 ENCOUNTER — Emergency Department (HOSPITAL_COMMUNITY)
Admission: EM | Admit: 2013-07-24 | Discharge: 2013-07-24 | Disposition: A | Discharge status: Home / Self Care | Payer: BC Managed Care – PPO | Attending: Emergency Medicine | Admitting: Emergency Medicine

## 2013-07-24 ENCOUNTER — Emergency Department (HOSPITAL_COMMUNITY): Payer: BC Managed Care – PPO

## 2013-07-24 ENCOUNTER — Encounter (HOSPITAL_COMMUNITY): Payer: Self-pay | Admitting: Emergency Medicine

## 2013-07-24 DIAGNOSIS — R05 Cough: Secondary | ICD-10-CM

## 2013-07-24 DIAGNOSIS — R062 Wheezing: Secondary | ICD-10-CM | POA: Insufficient documentation

## 2013-07-24 DIAGNOSIS — R059 Cough, unspecified: Secondary | ICD-10-CM | POA: Insufficient documentation

## 2013-07-24 DIAGNOSIS — J3489 Other specified disorders of nose and nasal sinuses: Secondary | ICD-10-CM | POA: Insufficient documentation

## 2013-07-24 DIAGNOSIS — Z87891 Personal history of nicotine dependence: Secondary | ICD-10-CM | POA: Insufficient documentation

## 2013-07-24 DIAGNOSIS — Z79899 Other long term (current) drug therapy: Secondary | ICD-10-CM | POA: Insufficient documentation

## 2013-07-24 DIAGNOSIS — K1370 Unspecified lesions of oral mucosa: Secondary | ICD-10-CM | POA: Insufficient documentation

## 2013-07-24 DIAGNOSIS — Z8742 Personal history of other diseases of the female genital tract: Secondary | ICD-10-CM | POA: Insufficient documentation

## 2013-07-24 MED ORDER — BENZONATATE 100 MG PO CAPS: 100.0000 mg | ORAL_CAPSULE | Freq: Three times a day (TID) | ORAL | Status: DC

## 2013-07-24 MED ORDER — ALBUTEROL SULFATE HFA 108 (90 BASE) MCG/ACT IN AERS
2.0000 | INHALATION_SPRAY | RESPIRATORY_TRACT | Status: DC | PRN
  Administered 2013-07-24: 2 via RESPIRATORY_TRACT
  Filled 2013-07-24: qty 6.7

## 2013-07-24 NOTE — ED Notes (Signed)
aerochamber/spacer given

## 2013-07-24 NOTE — ED Notes (Signed)
Afebrile, audible congestion in chest-with cough. No wheezing noted. Pt reports productive cough, chills, chest congestion x 1 week. Denies fever, NVD. Treatment with OTC meds

## 2013-07-24 NOTE — ED Provider Notes (Signed)
CSN: [id number]     Arrival date & time 07/24/13  1257 History  This chart was scribed for non-physician practitioner, Alecia Lemming, PA-C working with Ephraim Hamburger, MD by Frederich Balding, ED scribe. This patient was seen in room WTR8/WTR8 and the patient's care was started at 2:14 PM.   Chief Complaint  Patient presents with  . Oral Swelling    "white raised spot" in mouth x 8 months  . Cough    productive, green cough  . Chills    denies fever   The history is provided by the patient. No language interpreter was used.   HPI Comments: [name] is a 38 y.o. female who presents to the Emergency Department complaining of productive cough of green sputum and congestion that started 1 week ago. She states she had a fever, diaphoresis, chills and generalized body aches when the symptoms first started but it has resolved now. Pt has taken sudafed with no relief and mucinex with some relief. She states she sometimes hears herself wheeze. Pt states she also has a mouth lesion that she has had for several months and has been seen for in the past with no relief. She does not chew tobacco. Denies chest pain, emesis, sore throat. Denies history of asthma.   Past Medical History  Diagnosis Date  . Abnormal Pap smear   . Fibroids   . Bipolar 1 disorder   . IBS (irritable bowel syndrome)    Past Surgical History  Procedure Laterality Date  . Tubal ligation    . Cervical biopsy    . Cholecystectomy     No family history on file. History  Substance Use Topics  . Smoking status: Former Smoker -- 0.25 packs/day for 0 years  . Smokeless tobacco: Not on file  . Alcohol Use: No   OB History   Grav Para Term Preterm Abortions TAB SAB Ect Mult Living   7 2 2  5 4  1  2      Review of Systems  Constitutional: Negative for fever, chills and diaphoresis.  HENT: Positive for congestion and mouth sores. Negative for sore throat.   Respiratory: Positive for cough and wheezing.   Cardiovascular:  Negative for chest pain.  Gastrointestinal: Negative for vomiting.  Musculoskeletal: Negative for myalgias.    Allergies  Review of patient's allergies indicates no known allergies.  Home Medications   Current Outpatient Rx  Name  Route  Sig  Dispense  Refill  . promethazine (PHENERGAN) 25 MG tablet   Oral   Take 1 tablet (25 mg total) by mouth every 6 (six) hours as needed for nausea.   20 tablet   0   . ziprasidone (GEODON) 80 MG capsule   Oral   Take 80 mg by mouth daily.           Marland Kitchen zolpidem (AMBIEN) 10 MG tablet   Oral   Take 10 mg by mouth at bedtime.          BP 125/69  Pulse 70  Temp(Src) 97.7 F (36.5 C) (Oral)  Resp 20  Wt 253 lb (114.76 kg)  SpO2 99%  Physical Exam  Nursing note and vitals reviewed. Constitutional: She appears well-developed and well-nourished. No distress.  HENT:  Head: Normocephalic and atraumatic.  Right Ear: Tympanic membrane, external ear and ear canal normal.  Left Ear: Tympanic membrane, external ear and ear canal normal.  Nose: Nose normal. No mucosal edema or rhinorrhea.  Mouth/Throat: Uvula is midline, oropharynx  is clear and moist and mucous membranes are normal. Mucous membranes are not dry. No oral lesions. No trismus in the jaw. No uvula swelling. No oropharyngeal exudate, posterior oropharyngeal edema, posterior oropharyngeal erythema or tonsillar abscesses.  62mm raised firm gray nodule on right buccal mucosa.   Eyes: Conjunctivae and EOM are normal. Right eye exhibits no discharge. Left eye exhibits no discharge.  Neck: Normal range of motion. Neck supple. No tracheal deviation present.  Cardiovascular: Normal rate, regular rhythm and normal heart sounds.   Pulmonary/Chest: Effort normal. No respiratory distress. She has no wheezes. She has rhonchi. She has no rales.  Scattered rhonchi in the lungs. Coughing on exam.   Abdominal: Soft. There is no tenderness.  Musculoskeletal: Normal range of motion.  Lymphadenopathy:     She has no cervical adenopathy.  Neurological: She is alert.  Skin: Skin is warm and dry.  Psychiatric: She has a normal mood and affect. Her behavior is normal.    ED Course  Procedures (including critical care time)  DIAGNOSTIC STUDIES: Oxygen Saturation is 99% on RA, normal by my interpretation.    COORDINATION OF CARE: 2:20 PM-Discussed treatment plan which includes continuing mucinex, a cough medication and an inhaler with pt at bedside and pt agreed to plan. Advised pt to follow up with her PCP.  Labs Review Labs Reviewed - No data to display Imaging Review Dg Chest 2 View  07/24/2013   CLINICAL DATA:  Swelling, cough, chills  EXAM: CHEST  2 VIEW  COMPARISON:  02/23/2008  FINDINGS: Cardiomediastinal silhouette is stable. No acute infiltrate or pleural effusion. No pulmonary edema. Bony thorax is unremarkable.  IMPRESSION: No active cardiopulmonary disease.   Electronically Signed   By: Lahoma Crocker M.D.   On: 07/24/2013 13:37    EKG Interpretation   None      2:39 PM Patient seen and examined. Informed of CXR results.   Vital signs reviewed and are as follows: Filed Vitals:   07/24/13 1312  BP: 125/69  Pulse: 70  Temp: 97.7 F (36.5 C)  Resp: 20   Patient counseled on supportive care for viral URI and s/s to return including worsening symptoms, persistent fever, persistent vomiting, or if they have any other concerns.  Urged to see PCP if symptoms persist for more than 3 days. Patient verbalizes understanding and agrees with plan.     MDM   1. Cough   2. Oral mucosal lesion    Bronchitis: Patient with likely viral bronchitis. Post-viral infection, maybe influenza, last week. CXR neg. No concern for PNA given normal x-ray. Antibiotics not indicated. Conservative therapy indicated. No concern for PE.   Oral lesion: low risk for malignancy, appearance suggests mucocele however I would like her to f/u for definitive diagnosis.    I personally performed the  services described in this documentation, which was scribed in my presence. The recorded information has been reviewed and is accurate.   [name], PA-C 07/24/13 1440

## 2013-07-24 NOTE — Discharge Instructions (Signed)
Please read and follow all provided instructions.  Your diagnoses today include:  1. Cough   2. Oral mucosal lesion     Tests performed today include:  Chest x-ray - does not show any pneumonia  Vital signs. See below for your results today.   Medications prescribed:   Albuterol inhaler - medication that opens up your airway  Use inhaler as follows: 1-2 puffs with spacer every 4 hours as needed for wheezing, cough, or shortness of breath.    Tessalon Perles - cough suppressant medication  Take any prescribed medications only as directed.  Home care instructions:  Follow any educational materials contained in this packet.  Follow-up instructions: Please follow-up with your primary care provider in the next 3 days for further evaluation of your symptoms and a recheck if you are not feeling better. If you do not have a primary care doctor -- see below for referral information.   You will also need to see your doctor regarding the lesion in your mouth. This is likely a mucocele, however this should be confirmed by your primary care doctor.   Return instructions:   Please return to the Emergency Department if you experience worsening symptoms.  Please return with worsening wheezing, shortness of breath, or difficulty breathing.  Return with persistent fever above 101F.   Please return if you have any other emergent concerns.  Additional Information:  Your vital signs today were: BP 125/69   Pulse 70   Temp(Src) 97.7 F (36.5 C) (Oral)   Resp 20   Wt 253 lb (114.76 kg)   SpO2 99%   LMP 07/03/2013 If your blood pressure (BP) was elevated above 135/85 this visit, please have this repeated by your doctor within one month. --------------  Emergency Department Resource Guide 1) Find a Doctor and Pay Out of Pocket Although you won't have to find out who is covered by your insurance plan, it is a good idea to ask around and get recommendations. You will then need to call the  office and see if the doctor you have chosen will accept you as a new patient and what types of options they offer for patients who are self-pay. Some doctors offer discounts or will set up payment plans for their patients who do not have insurance, but you will need to ask so you aren't surprised when you get to your appointment.  2) Contact Your Local Health Department Not all health departments have doctors that can see patients for sick visits, but many do, so it is worth a call to see if yours does. If you don't know where your local health department is, you can check in your phone book. The CDC also has a tool to help you locate your state's health department, and many state websites also have listings of all of their local health departments.  3) Find a [facility] If your illness is not likely to be very severe or complicated, you may want to try a walk in clinic. These are popping up all over the country in pharmacies, drugstores, and shopping centers. They're usually staffed by nurse practitioners or physician assistants that have been trained to treat common illnesses and complaints. They're usually fairly quick and inexpensive. However, if you have serious medical issues or chronic medical problems, these are probably not your best option.  No Primary Care Doctor: - Call Health Connect at  [phone] - they can help you locate a primary care doctor that  accepts your insurance, provides  certain services, etc. - Physician Referral Service- [phone]  Chronic Pain Problems: Organization         Address  Phone   Notes  [facility]  [phone] Patients need to be referred by their primary care doctor.   Medication Assistance: Organization         Address  Phone   Notes  [facility] Medication [facility] [address] [place], [place] [address] [phone] --Must be a resident of [facility] -- Must have NO insurance coverage  whatsoever (no Medicaid/ Medicare, etc.) -- The pt. MUST have a primary care doctor that directs their care regularly and follows them in the community   MedAssist  [phone]   [employer]  [phone]    Agencies that provide inexpensive medical care: Organization         Address  Phone   Notes  [place]  [phone]   [facility]    [phone]   [facility] [place], [place] [address] [phone]   [place] [address], [place] [phone]   Planned Parenthood    ([phone]   [facility]    [phone]   [place] and [place] [address], [place] Phone:  [phone], Fax:  [phone] Hours of Operation:  9 am - 6 pm, M-F.  Also accepts Medicaid/Medicare and self-pay.  [facility] for [place] [address], [address], [place] Phone: [phone], Fax: [phone]. Hours of Operation:  8:30 am - 5:30 pm, M-F.  Also accepts Medicaid and self-pay.  [facility] [address], [place] Phone: [phone]   [place], [place], [place] [phone], Ext. [phone] Mondays & Thursdays: 7-9 AM.  First 15 patients are seen on a first come, first serve basis.    [place] Providers:  Organization         Address  Phone   Notes  [facility] [address], [place] [phone] Also accepts self-pay patients.  [facility] [address], [place]  [phone]   [place], [address], [place] [phone]   [facility] [address] [place] [phone]   Lucianne Lei [address], [place]   [phone] Only accepts [place] Access [place] patients after they have their name applied  to their card.   Self-Pay (no insurance) in [facility]:  Organization         Address  Phone   Notes  Sickle Cell Patients, [facility] [place] [phone]   [facility] Urgent Care [place] [phone]   [facility] [place]  [place], [place], [place] [phone]   [facility]/Dr. Osei-Bonsu  [address], [address] [place] or [address], [place] [phone] Phone number for both [place] and [place] locations is the same.  Urgent Medical [facility] [address], [place] [phone]   [facility] [address], [place] or [address] [phone]   [facility] [address]  [address] [place] [phone], phone; [phone], fax Sees patients 1st and 3rd Saturday of every month.  Must not qualify for public or private insurance (i.e. Medicaid, Medicare, [place] Health Choice, Veterans' Benefits)  Household income should be no more than 200% of the poverty level The clinic cannot treat you if you are pregnant or think you are pregnant  Sexually transmitted diseases are not treated at the clinic.    Dental Care: Organization         Address  Phone  Notes  [facility] Department of [facility] [place] [phone] Accepts children up to age 48 who are enrolled in [place] or [place]; pregnant women with a Medicaid card; and children who have applied for Medicaid or [place] Health Choice, but were declined, whose parents can pay a reduced fee at time of service.  [facility] Department of [facility]  [address], [place] [phone] Accepts children up to age 35 who are enrolled in [place] or [place]; pregnant women with a Medicaid card; and children who have applied for Medicaid or   Health Choice, but were declined, whose parents can pay a reduced fee at time of service.  [place] Adult Dental Access PROGRAM  [place] [phone] Patients are seen by appointment only. Walk-ins are not accepted. [place] will see patients 68 years of age and older. Monday - Tuesday (8am-5pm) Most Wednesdays (8:30-5pm) $30 per visit, cash only  [facility] Adult Dental Access PROGRAM  [address], [facility] [phone] Patients are seen by appointment only. Walk-ins are not accepted. [place] will see patients 15 years of age and older. One Wednesday Evening (Monthly: Volunteer Based).  $30 per visit, cash only  [place]  [phone] for adults; Children under age 41, call Graduate Pediatric Dentistry at [phone]. Children aged 40-14, please call [phone] to request a pediatric application.  Dental services are provided in all areas of dental care including fillings, crowns and bridges, complete and partial dentures, implants, gum treatment, root canals, and extractions. Preventive care is also provided. Treatment is provided to both adults and children. Patients are selected via a lottery and there is often a waiting list.   [facility] [address], [place]  [phone] [email or web address]   [facility] [address] [place], [place] [phone], Ext. [phone] Second and Fourth Thursday of each month, opens at 6:30 AM; Clinic ends at 9 AM.  Patients are seen on a first-come first-served basis, and a limited number are seen during each clinic.   [facility]  [address] [place], [place] [phone]   Eligibility Requirements You must have lived in [place], [place], or [place] counties for at least the last three months.   You cannot be eligible for state or federal sponsored [employer], including [employer], [place], or [employer].    You generally cannot be eligible for healthcare insurance through your employer.    How to apply: Eligibility screenings are held every Tuesday and Wednesday afternoon from 1:00 pm until 4:00 pm. You do not need an appointment for the interview!  [facility] [address], [place], [place]   [place]  [place]  [place]  [phone]  Behavioral Health Resources in the Community: Intensive Outpatient Programs Organization         Address  Phone  Notes  [place]. [address], [place], [place] [phone]   [facility] Outpatient [address] [place], [place]   ADS: Alcohol & Drug Svcs [address], [place], [place]   [place] [address],  [place], [place] or [phone]   Substance Abuse Resources Organization         Address  Phone  Notes  Alcohol and Drug Services  [phone]   [place]  [phone]   [facility] [place]   Chinita Pester  [phone]   Residential & Outpatient Substance Abuse Program  [phone]   Psychological Services Organization         Address  Phone  Notes  [facility] [place]  [place]  [phone]   [place] [address] [place] or [phone]    Mobile Crisis Teams Organization         Address  Phone  Notes  [facility], Mobile Crisis Care Unit  [phone]   [facility]  [address] [place], [place]   [place] [address], [place] [phone]    Self-Help/Support Groups Organization         Address  Phone             Notes  [place] [facility] [place] - variety of support groups  [place] Call  for more information  Narcotics Anonymous (NA), [facility]aring [facility] [address], [employer] [place]  2 meetings at this location   Special educational needs teacher         Address  Phone  Notes  [facility] Residential Treatment [place],    [place]  [phone]   [facility]  [address] [place] [id number], [place], [place]   [place], [place] [phone] Admissions: 8am-3pm M-F  Incentives Substance [place] [address]    [place], [place] [phone]   [facility] [address] [place], [place], [place]   [facility] [address]  [place], [place]   [facility] Programs - Intensive Outpatient [address], [place], [place]   [facility] [place].,  [place], [place] [phone] or [phone]   [facility]idential Treatment Services (RTS) [address], [place], [place] Accepts Medicaid  [facility] [place] [address],  [place] [phone] Substance Abuse/Addiction Treatment   [facility] Organization         Address  Phone  Notes  [facility]  [phone]   Domenic Schwab, PhD [address] Arlis Port[place], [place]   [phone] or [phone]   [place], [place] [phone]   [place] [address] [place], [place] [phone] Insurance/Medicaid/sponsorship through [employer] and Families [address] [place]                                    [place], [place] [phone] [place],  [place] [phone]    Dr. Adele Schilder  [phone]   [facility] [place] Dept. 1) [address], [place] 2) [place] 3)  [address], [place]  [phone]  [phone]   [place] [phone] or [phone] (After Hours)

## 2013-07-25 NOTE — ED Provider Notes (Signed)
Medical screening examination/treatment/procedure(s) were performed by non-physician practitioner and as supervising physician I was immediately available for consultation/collaboration.  EKG Interpretation   None         Smriti Barkow T Mars Scheaffer, MD 07/25/13 0731 

## 2014-05-04 ENCOUNTER — Encounter (HOSPITAL_COMMUNITY): Payer: Self-pay | Admitting: Emergency Medicine

## 2015-08-25 ENCOUNTER — Ambulatory Visit: Payer: Self-pay | Admitting: Obstetrics

## 2015-08-25 ENCOUNTER — Ambulatory Visit: Payer: BLUE CROSS/BLUE SHIELD | Admitting: Obstetrics

## 2015-10-11 ENCOUNTER — Ambulatory Visit (INDEPENDENT_AMBULATORY_CARE_PROVIDER_SITE_OTHER): Payer: BLUE CROSS/BLUE SHIELD | Admitting: Obstetrics

## 2015-10-11 ENCOUNTER — Encounter: Payer: Self-pay | Admitting: Obstetrics

## 2015-10-11 VITALS — BP 148/86 | HR 86 | Temp 98.5°F | Wt 266.0 lb

## 2015-10-11 DIAGNOSIS — F3170 Bipolar disorder, currently in remission, most recent episode unspecified: Secondary | ICD-10-CM

## 2015-10-11 DIAGNOSIS — B9689 Other specified bacterial agents as the cause of diseases classified elsewhere: Secondary | ICD-10-CM

## 2015-10-11 DIAGNOSIS — N760 Acute vaginitis: Secondary | ICD-10-CM | POA: Diagnosis not present

## 2015-10-11 DIAGNOSIS — Z8719 Personal history of other diseases of the digestive system: Secondary | ICD-10-CM

## 2015-10-11 DIAGNOSIS — E669 Obesity, unspecified: Secondary | ICD-10-CM

## 2015-10-11 DIAGNOSIS — Z01419 Encounter for gynecological examination (general) (routine) without abnormal findings: Secondary | ICD-10-CM

## 2015-10-11 DIAGNOSIS — F17200 Nicotine dependence, unspecified, uncomplicated: Secondary | ICD-10-CM

## 2015-10-11 DIAGNOSIS — Z0000 Encounter for general adult medical examination without abnormal findings: Secondary | ICD-10-CM

## 2015-10-11 LAB — POCT URINALYSIS DIPSTICK
Bilirubin, UA: NEGATIVE
Blood, UA: NEGATIVE
Glucose, UA: NEGATIVE
Ketones, UA: NEGATIVE
Leukocytes, UA: NEGATIVE
Nitrite, UA: NEGATIVE
Spec Grav, UA: 1.025
Urobilinogen, UA: NEGATIVE
pH, UA: 5

## 2015-10-11 MED ORDER — METRONIDAZOLE 500 MG PO TABS: 500.0000 mg | ORAL_TABLET | Freq: Two times a day (BID) | ORAL | Status: DC

## 2015-10-11 NOTE — Progress Notes (Signed)
Subjective:        [name] is a 40 y.o. female here for a routine exam.  Current complaints: malodorous vaginal discharge and 2 periods over past 2 months.    Personal health questionnaire:  Is patient Ashkenazi Jewish, have a family history of breast and/or ovarian cancer: no Is there a family history of uterine cancer diagnosed at age < 59, gastrointestinal cancer, urinary tract cancer, family member who is a Field seismologist syndrome-associated carrier: no Is the patient overweight and hypertensive, family history of diabetes, personal history of gestational diabetes, preeclampsia or PCOS: no Is patient over 27, have PCOS,  family history of premature CHD under age 33, diabetes, smoke, have hypertension or peripheral artery disease:  no At any time, has a partner hit, kicked or otherwise hurt or frightened you?: no Over the past 2 weeks, have you felt down, depressed or hopeless?: no Over the past 2 weeks, have you felt little interest or pleasure in doing things?:no   Gynecologic History No LMP recorded. Contraception: tubal ligation Last Pap: 2008. Results were: normal Last mammogram: n/a. Results were: n/a  Obstetric History OB History  Gravida Para Term Preterm AB SAB TAB Ectopic Multiple Living  7 2 2  5  4 1  2     # Outcome Date GA Lbr Len/2nd Weight Sex Delivery Anes PTL Lv  7 TAB           6 TAB           5 TAB           4 TAB           3 Ectopic           2 Term           1 Term               Past Medical History  Diagnosis Date  . Abnormal Pap smear   . Fibroids   . Bipolar 1 disorder ([place])   . IBS (irritable bowel syndrome)     Past Surgical History  Procedure Laterality Date  . Tubal ligation    . Cervical biopsy    . Cholecystectomy       Current outpatient prescriptions:  .  benztropine (COGENTIN) 0.5 MG tablet, Take 0.5 mg by mouth at bedtime., Disp: , Rfl:  .  Dextromethorphan-Guaifenesin (MUCINEX DM PO), Take 30 mLs by mouth every 4 (four)  hours. Reported on 10/11/2015, Disp: , Rfl:  .  pseudoephedrine (SUDAFED) 30 MG tablet, Take 60 mg by mouth every 4 (four) hours as needed for congestion. Reported on 10/11/2015, Disp: , Rfl:  .  ziprasidone (GEODON) 80 MG capsule, Take 80 mg by mouth at bedtime. Reported on 10/11/2015, Disp: , Rfl:  .  zolpidem (AMBIEN) 10 MG tablet, Take 10 mg by mouth at bedtime. Reported on 10/11/2015, Disp: , Rfl:  No Known Allergies  Social History  Substance Use Topics  . Smoking status: Former Smoker -- 0.25 packs/day for 0 years  . Smokeless tobacco: Not on file  . Alcohol Use: No    History reviewed. No pertinent family history.    Review of Systems  Constitutional: negative for fatigue and weight loss Respiratory: negative for cough and wheezing Cardiovascular: negative for chest pain, fatigue and palpitations Gastrointestinal: positive for occasional abdominal pain and change in bowel habits Musculoskeletal:negative for myalgias Neurological: negative for gait problems and tremors Behavioral/Psych: positive for Bipolar Disorder Endocrine: negative for temperature intolerance  Genitourinary:positive for abnormal menstrual period and malodorous vaginal discharge over past 2 months Integument/breast: negative for breast lump, breast tenderness, nipple discharge and skin lesion(s)    Objective:       BP 148/86 mmHg  Pulse 86  Temp(Src) 98.5 F (36.9 C)  Wt 266 lb (120.657 kg) General:   alert and no distress  Skin:   no rash or abnormalities  Lungs:   clear to auscultation bilaterally  Heart:   regular rate and rhythm, S1, S2 normal, no murmur, click, rub or gallop  Breasts:   normal without suspicious masses, skin or nipple changes or axillary nodes  Abdomen:  normal findings: no organomegaly, soft, non-tender and no hernia  Pelvis:  External genitalia: normal general appearance Urinary system: urethral meatus normal and bladder without fullness, nontender Vaginal: normal without  tenderness, induration or masses Cervix: normal appearance Adnexa: normal bimanual exam Uterus: anteverted and non-tender, normal size   Lab Review Urine pregnancy test Labs reviewed yes Radiologic studies reviewed no    Assessment:    Healthy female exam.    BV  H/O IBS  H/O Bipolar Disorder   Plan:   Metronidazole Rx for BV Referred to IM for routine health maintenance and management of multiple medical problems.   Education reviewed: calcium supplements, depression evaluation, low fat, low cholesterol diet, safe sex/STD prevention, self breast exams, smoking cessation and weight bearing exercise. Follow up in: 1 year.   No orders of the defined types were placed in this encounter.   Orders Placed This Encounter  Procedures  . POCT urinalysis dipstick

## 2015-10-13 ENCOUNTER — Telehealth: Payer: Self-pay

## 2015-10-13 NOTE — Telephone Encounter (Signed)
tried to call patient regarding pcp appt - [facility] denied and the one Dr, requested did not have appts until Nov. will sch with [place] if ok with patient - phone would not go through

## 2015-10-14 ENCOUNTER — Other Ambulatory Visit: Payer: Self-pay | Admitting: Obstetrics

## 2015-10-14 LAB — NUSWAB VAGINITIS PLUS (VG+)
Atopobium vaginae: HIGH Score — AB
BVAB 2: HIGH Score — AB
Candida albicans, NAA: NEGATIVE
Candida glabrata, NAA: NEGATIVE
Chlamydia trachomatis, NAA: NEGATIVE
Megasphaera 1: HIGH Score — AB
Neisseria gonorrhoeae, NAA: NEGATIVE
Trich vag by NAA: NEGATIVE

## 2015-10-15 LAB — PAP IG AND HPV HIGH-RISK
HPV, high-risk: NEGATIVE
PAP Smear Comment: 0

## 2015-11-16 ENCOUNTER — Ambulatory Visit: Payer: Self-pay | Admitting: Family Medicine

## 2015-12-17 ENCOUNTER — Encounter (HOSPITAL_COMMUNITY): Payer: Self-pay | Admitting: Emergency Medicine

## 2015-12-17 ENCOUNTER — Emergency Department (HOSPITAL_COMMUNITY)
Admission: EM | Admit: 2015-12-17 | Discharge: 2015-12-18 | Disposition: A | Discharge status: Home / Self Care | Payer: BC Managed Care – PPO | Attending: Emergency Medicine | Admitting: Emergency Medicine

## 2015-12-17 ENCOUNTER — Ambulatory Visit (HOSPITAL_COMMUNITY)
Admission: RE | Admit: 2015-12-17 | Discharge: 2015-12-17 | Disposition: A | Discharge status: Home / Self Care | Payer: BC Managed Care – PPO | Attending: Psychiatry | Admitting: Psychiatry

## 2015-12-17 DIAGNOSIS — F312 Bipolar disorder, current episode manic severe with psychotic features: Secondary | ICD-10-CM | POA: Diagnosis not present

## 2015-12-17 DIAGNOSIS — R443 Hallucinations, unspecified: Secondary | ICD-10-CM | POA: Insufficient documentation

## 2015-12-17 DIAGNOSIS — F319 Bipolar disorder, unspecified: Secondary | ICD-10-CM | POA: Insufficient documentation

## 2015-12-17 DIAGNOSIS — Z87891 Personal history of nicotine dependence: Secondary | ICD-10-CM | POA: Insufficient documentation

## 2015-12-17 LAB — COMPREHENSIVE METABOLIC PANEL
ALT: 27 U/L (ref 14–54)
AST: 36 U/L (ref 15–41)
Albumin: 4.6 g/dL (ref 3.5–5.0)
Alkaline Phosphatase: 71 U/L (ref 38–126)
Anion gap: 9 (ref 5–15)
BUN: 11 mg/dL (ref 6–20)
CO2: 23 mmol/L (ref 22–32)
Calcium: 9.5 mg/dL (ref 8.9–10.3)
Chloride: 104 mmol/L (ref 101–111)
Creatinine, Ser: 0.87 mg/dL (ref 0.44–1.00)
GFR calc Af Amer: >60 mL/min (ref >60)
GFR calc non Af Amer: >60 mL/min (ref >60)
Glucose, Bld: 131 mg/dL — ABNORMAL HIGH (ref 65–99)
Potassium: 3.4 mmol/L — ABNORMAL LOW (ref 3.5–5.1)
Sodium: 136 mmol/L (ref 135–145)
Total Bilirubin: 1 mg/dL (ref 0.3–1.2)
Total Protein: 8.3 g/dL — ABNORMAL HIGH (ref 6.5–8.1)

## 2015-12-17 LAB — CBC
HCT: 36.1 % (ref 36.0–46.0)
Hemoglobin: 12 g/dL (ref 12.0–15.0)
MCH: 25.8 pg — ABNORMAL LOW (ref 26.0–34.0)
MCHC: 33.2 g/dL (ref 30.0–36.0)
MCV: 77.6 fL — ABNORMAL LOW (ref 78.0–100.0)
Platelets: 376 10*3/uL (ref 150–400)
RBC: 4.65 MIL/uL (ref 3.87–5.11)
RDW: 16.8 % — ABNORMAL HIGH (ref 11.5–15.5)
WBC: 13.7 10*3/uL — ABNORMAL HIGH (ref 4.0–10.5)

## 2015-12-17 LAB — SALICYLATE LEVEL: Salicylate Lvl: <4 mg/dL (ref 2.8–30.0)

## 2015-12-17 LAB — ETHANOL: Alcohol, Ethyl (B): <5 mg/dL (ref <5)

## 2015-12-17 LAB — ACETAMINOPHEN LEVEL: Acetaminophen (Tylenol), Serum: <10 ug/mL — ABNORMAL LOW (ref 10–30)

## 2015-12-17 MED ORDER — ZIPRASIDONE HCL 20 MG PO CAPS
60.0000 mg | ORAL_CAPSULE | Freq: Every day | ORAL | Status: DC
  Administered 2015-12-17: 60 mg via ORAL
  Filled 2015-12-17: qty 3

## 2015-12-17 MED ORDER — BUSPIRONE HCL 10 MG PO TABS
10.0000 mg | ORAL_TABLET | Freq: Every day | ORAL | Status: DC
  Administered 2015-12-18: 10 mg via ORAL
  Filled 2015-12-17 (×2): qty 1

## 2015-12-17 MED ORDER — BENZTROPINE MESYLATE 1 MG PO TABS
0.5000 mg | ORAL_TABLET | Freq: Every day | ORAL | Status: DC
  Administered 2015-12-17: 0.5 mg via ORAL
  Filled 2015-12-17: qty 1
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
## 2015-12-17 NOTE — ED Notes (Signed)
When pt was dropped off by [facility] employee he stated that her purse was at [facility] and that he would bring it to [facility]. This RN just called to follow up. Mechele Claude [facility] stated that she would follow up and call back

## 2015-12-17 NOTE — ED Notes (Addendum)
Pt from [facility] via [facility] for medical clearance. Pt is to go to [facility] following med clearance. Pt states she woke up this morning and has been having AVH today. Pt states that she does not remember what she did yesterday and pt states she did not take her geodone yesterday. Pt is tearful at time of assessment and has flight of ideas. Pt states she "is worried she is going to be the fall man for her family" and is focused on how "she let the kids down" but is unable to explain either. Pt is cooperative. Pt denies SI, HI. Pt is voluntary at this time

## 2015-12-18 DIAGNOSIS — F312 Bipolar disorder, current episode manic severe with psychotic features: Secondary | ICD-10-CM

## 2015-12-18 LAB — RAPID URINE DRUG SCREEN, HOSP PERFORMED
Amphetamines: NOT DETECTED
Barbiturates: NOT DETECTED
Benzodiazepines: NOT DETECTED
Cocaine: NOT DETECTED
Opiates: NOT DETECTED
Tetrahydrocannabinol: POSITIVE — AB

## 2015-12-18 MED ORDER — ZIPRASIDONE HCL 80 MG PO CAPS: 80.0000 mg | ORAL_CAPSULE | Freq: Every day | ORAL | Status: DC

## 2015-12-18 MED ORDER — ZIPRASIDONE HCL 20 MG PO CAPS
80.0000 mg | ORAL_CAPSULE | Freq: Every day | ORAL | Status: DC
  Filled 2015-12-18: qty 4

## 2015-12-18 MED ORDER — TRIHEXYPHENIDYL HCL 5 MG PO TABS: 5.0000 mg | ORAL_TABLET | Freq: Two times a day (BID) | ORAL | Status: DC

## 2015-12-18 MED ORDER — TRIHEXYPHENIDYL HCL 5 MG PO TABS
5.0000 mg | ORAL_TABLET | Freq: Two times a day (BID) | ORAL | Status: DC
  Administered 2015-12-18: 5 mg via ORAL
  Filled 2015-12-18 (×3): qty 1

## 2015-12-18 MED ORDER — BUSPIRONE HCL 10 MG PO TABS: 10.0000 mg | ORAL_TABLET | Freq: Two times a day (BID) | ORAL | Status: DC

## 2015-12-18 NOTE — ED Notes (Addendum)
Patient stated "she does not feel safe at home and is not ready to be discharged.  She has hallucinations about [facility] and the show is about her.  She states the triggers is stress from unpredictable situations.  Patient didn't leave house because she thought someone was outside looking for her and chasing her.  She was driving to work and could not find her work place".    Patient states if "she has to go come home; she will not be able to tolerate the stress.  She feels like her medications are not stable.  If she goes home, she will start driving and get confused and become  paranoid".    Patient states "she is not at risk for harming herself but she states she feels like someone will come to her apartment today and harm her".   Informed (TSS) at 11:56 am Shanon Brow of the situation, he stated he would talk to Dr. Darleene Cleaver (11:57)

## 2015-12-18 NOTE — ED Notes (Signed)
Pt belongings placed in locker #28.  

## 2015-12-18 NOTE — ED Notes (Signed)
Pt resting on stretcher with eyes closed, pt was easily arousable to verbal stimuli. 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 Pt also states her sister took her cell phone so she is unable to contact anyone. Advised pt she will be able to use the phone this am after 9.

## 2015-12-18 NOTE — ED Notes (Signed)
Patient was A& O x4.  Left ER with her sister.

## 2015-12-18 NOTE — ED Notes (Signed)
Pt transferred to TCU. Report given to Magda Paganini, [place].

## 2015-12-18 NOTE — ED Provider Notes (Signed)
CSN: [id number]     Arrival date & time 12/17/15  2113 History   First MD Initiated Contact with Patient 12/17/15 2158     Chief Complaint  Patient presents with  . Hallucinations  . Medical Clearance     HPI Patient presents for evaluation of hallucinations. She has a history of bipolar disorder. Has had episodes of psychosis in the past. Meds include Cogentin, BuSpar, Geodon. States that she didn't take them last night, however she's been compliant. States she works at [employer]. States that she had trouble finding her way to work yesterday. She felt confused. States she's had outbursts of being crying and tearful. Other times is been "screaming and hollering". Patient is pleasant cooperative here. States she's been hearing voices today telling her that if she "goes out that things are going to happen to her".   Past Medical History  Diagnosis Date  . Abnormal Pap smear   . Fibroids   . Bipolar 1 disorder ([place])   . IBS (irritable bowel syndrome)    Past Surgical History  Procedure Laterality Date  . Tubal ligation    . Cervical biopsy    . Cholecystectomy     No family history on file. Social History  Substance Use Topics  . Smoking status: Former Smoker -- 0.25 packs/day for 0 years  . Smokeless tobacco: None  . Alcohol Use: No   OB History    Gravida Para Term Preterm AB TAB SAB Ectopic Multiple Living   7 2 2  5 4  1  2      Review of Systems  Constitutional: Negative for fever, chills, diaphoresis, appetite change and fatigue.  HENT: Negative for mouth sores, sore throat and trouble swallowing.   Eyes: Negative for visual disturbance.  Respiratory: Negative for cough, chest tightness, shortness of breath and wheezing.   Cardiovascular: Negative for chest pain.  Gastrointestinal: Negative for nausea, vomiting, abdominal pain, diarrhea and abdominal distention.  Endocrine: Negative for polydipsia, polyphagia and polyuria.  Genitourinary: Negative for dysuria,  frequency and hematuria.  Musculoskeletal: Negative for gait problem.  Skin: Negative for color change, pallor and rash.  Neurological: Negative for dizziness, syncope, light-headedness and headaches.  Hematological: Does not bruise/bleed easily.  Psychiatric/Behavioral: Positive for hallucinations and dysphoric mood. Negative for behavioral problems and confusion. The patient is nervous/anxious.       Allergies  Review of patient's allergies indicates no known allergies.  Home Medications   Prior to Admission medications   Medication Sig Start Date End Date Taking? Authorizing Provider  benztropine (COGENTIN) 0.5 MG tablet Take 0.5 mg by mouth at bedtime.   Yes Historical Provider, MD  busPIRone (BUSPAR) 10 MG tablet Take 10 mg by mouth daily.   Yes Historical Provider, MD  ziprasidone (GEODON) 80 MG capsule Take 80 mg by mouth at bedtime. Reported on 10/11/2015   Yes Historical Provider, MD  metroNIDAZOLE (FLAGYL) 500 MG tablet Take 1 tablet (500 mg total) by mouth 2 (two) times daily. Patient not taking: Reported on 12/17/2015 10/11/15   Shelly Bombard, MD   BP 137/94 mmHg  Pulse 99  Temp(Src) 98 F (36.7 C) (Oral)  Resp 18  Ht 5\' 11"  (1.803 m)  Wt 266 lb (120.657 kg)  BMI 37.12 kg/m2  SpO2 98% Physical Exam  Constitutional: She is oriented to person, place, and time. She appears well-developed and well-nourished. No distress.  HENT:  Head: Normocephalic.  Eyes: Conjunctivae are normal. Pupils are equal, round, and reactive to  light. No scleral icterus.  Neck: Normal range of motion. Neck supple. No thyromegaly present.  Cardiovascular: Normal rate and regular rhythm.  Exam reveals no gallop and no friction rub.   No murmur heard. Pulmonary/Chest: Effort normal and breath sounds normal. No respiratory distress. She has no wheezes. She has no rales.  Abdominal: Soft. Bowel sounds are normal. She exhibits no distension. There is no tenderness. There is no rebound.   Musculoskeletal: Normal range of motion.  Neurological: She is alert and oriented to person, place, and time.  Skin: Skin is warm and dry. No rash noted.  Psychiatric: She has a normal mood and affect. Her behavior is normal.    ED Course  Procedures (including critical care time) Labs Review Labs Reviewed  COMPREHENSIVE METABOLIC PANEL - Abnormal; Notable for the following:    Potassium 3.4 (*)    Glucose, Bld 131 (*)    Total Protein 8.3 (*)    All other components within normal limits  ACETAMINOPHEN LEVEL - Abnormal; Notable for the following:    Acetaminophen (Tylenol), Serum <10 (*)    All other components within normal limits  CBC - Abnormal; Notable for the following:    WBC 13.7 (*)    MCV 77.6 (*)    MCH 25.8 (*)    RDW 16.8 (*)    All other components within normal limits  ETHANOL  SALICYLATE LEVEL  URINE RAPID DRUG SCREEN, HOSP PERFORMED  POC URINE PREG, ED    Imaging Review No results found. I have personally reviewed and evaluated these images and lab results as part of my medical decision-making.   EKG Interpretation None      MDM   Final diagnoses:  Hallucinations    Await TTS evaluation.  Medically clear    Tanna Furry, MD 12/18/15 [phone]

## 2015-12-18 NOTE — BHH Suicide Risk Assessment (Signed)
Suicide Risk Assessment  Discharge Assessment   [facility] Discharge Suicide Risk Assessment   Principal Problem: Bipolar affective disorder, current episode manic with psychotic symptoms [facility]) Discharge Diagnoses:  Patient Active Problem List   Diagnosis Date Noted  . Bipolar affective disorder, current episode manic with psychotic symptoms ([place]) [F31.2] 12/18/2015    Priority: High  . CHOLECYSTITIS, UNSPECIFIED [K81.9] 06/12/2008  . BILIARY COLIC [id number]  . CERUMEN IMPACTION, BILATERAL [H61.20] 01/17/2008  . PHARYNGITIS, VIRAL [J02.9] 01/17/2008  . NECK PAIN [M54.2] 01/17/2008    Total Time spent with patient: 45 minutes   Musculoskeletal: Strength & Muscle Tone: within normal limits Gait & Station: normal Patient leans: N/A  Psychiatric Specialty Exam: Physical Exam  Constitutional: She is oriented to person, place, and time. She appears well-developed and well-nourished.  HENT:  Head: Normocephalic.  Neck: Normal range of motion.  Respiratory: Effort normal.  GI: Soft.  Musculoskeletal: Normal range of motion.  Neurological: She is alert and oriented to person, place, and time.  Skin: Skin is warm and dry.  Psychiatric: Her speech is normal and behavior is normal. Judgment and thought content normal. Her mood appears anxious.    Review of Systems  Constitutional: Negative.   HENT: Negative.   Eyes: Negative.   Respiratory: Negative.   Cardiovascular: Negative.   Gastrointestinal: Negative.   Genitourinary: Negative.   Musculoskeletal: Negative.   Skin: Negative.   Neurological: Negative.   Endo/Heme/Allergies: Negative.   Psychiatric/Behavioral: The patient is nervous/anxious.     Blood pressure 127/74, pulse 85, temperature 98.2 F (36.8 C), temperature source Oral, resp. rate 16, height 5\' 11"  (1.803 m), weight 120.657 kg (266 lb), SpO2 100 %.Body mass index is 37.12 kg/(m^2).  General Appearance: Casual  Eye Contact:  Good  Speech:  Normal Rate   Volume:  Normal  Mood:  Anxious  Affect:  Congruent  Thought Process:  Coherent and Descriptions of Associations: Intact  Orientation:  Full (Time, Place, and Person)  Thought Content:  Hallucinations: Auditory  Suicidal Thoughts:  No  Homicidal Thoughts:  No  Memory:  Immediate;   Good Recent;   Good Remote;   Good  Judgement:  Good  Insight:  Good  Psychomotor Activity:  Normal  Concentration:  Concentration: Good and Attention Span: Good  Recall:  Good  Fund of Knowledge:  Good  Language:  Good  Akathisia:  No  Handed:  Right  AIMS (if indicated):     Assets:  Communication Skills Desire for Improvement Housing Leisure Time Physical Health Resilience Social Support Talents/Skills Transportation Vocational/Educational  ADL's:  Intact  Cognition:  WNL  Sleep:       Mental Status Per Nursing Assessment::   On Admission:   hallucinations, anxiety  Demographic Factors:  NA  Loss Factors: NA  Historical Factors: NA  Risk Reduction Factors:   Responsible for children under 27 years of age, Sense of responsibility to family, Employed, Living with another person, especially a relative, Positive social support and Positive therapeutic relationship  Continued Clinical Symptoms:  Anxiety, minimal auditory hallucinations  Cognitive Features That Contribute To Risk:  None    Suicide Risk:  Minimal: No identifiable suicidal ideation.  Patients presenting with no risk factors but with morbid ruminations; may be classified as minimal risk based on the severity of the depressive symptoms    Plan Of Care/Follow-up recommendations:  Activity:  as tolerated Diet:  heart healthy diet  Reta Norgren, NP 12/18/2015, 11:55 AM

## 2015-12-18 NOTE — ED Notes (Signed)
Asked to monitor patient for 2 hours after administering Artane

## 2015-12-18 NOTE — ED Notes (Signed)
Morning tray given to patient

## 2015-12-18 NOTE — ED Notes (Signed)
Patient eating lunch and contacting ride for home

## 2015-12-18 NOTE — ED Notes (Signed)
Bed: WA28 Expected date:  Expected time:  Means of arrival:  Comments: 

## 2015-12-18 NOTE — Consult Note (Signed)
[facility] Consult   Reason for Consult:  Auditory hallucinations and anxiety Referring Physician:  EDP Patient Identification: [name] MRN:  [id number] Principal Diagnosis: Bipolar affective disorder, current episode manic with psychotic symptoms ([place]) Diagnosis:   Patient Active Problem List   Diagnosis Date Noted  . Bipolar affective disorder, current episode manic with psychotic symptoms ([place]) [F31.2] 12/18/2015    Priority: High  . CHOLECYSTITIS, UNSPECIFIED [K81.9] 06/12/2008  . BILIARY COLIC [A41.66] 12/31/1599  . CERUMEN IMPACTION, BILATERAL [H61.20] 01/17/2008  . PHARYNGITIS, VIRAL [J02.9] 01/17/2008  . NECK PAIN [M54.2] 01/17/2008    Total Time spent with patient: 45 minutes  Subjective:   [name] is a 40 y.o. female patient does not warrant admission.  HPI:  40 yo female who presented to the ED with auditory hallucinations and an increase in auditory hallucinations.  She reports she had her Geodon reduced from 80 mg to 60 mg recently and her symptoms returned, hallucinations with anxiety and paranoia.  [name] was having difficulty getting in touch with her regular psychiatrist, Pauline Good.  Patient is clear and coherent and requests medication changed back to 80 mg and discharge.   Denies suicidal/homicidal ideations, minimal auditory hallucinations (non-command), and alcohol/drug abuse.  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  Stable for discharge with Rx, lives with her 50 and 33 yo children.  Past Psychiatric History: bipolar disorder  Risk to Self: Is patient at risk for suicide?: No, but patient needs Medical Clearance Risk to Others:  no Prior Inpatient Therapy:   no Prior Outpatient Therapy:  yes  Past Medical History:  Past Medical History  Diagnosis Date  . Abnormal Pap smear   . Fibroids   . Bipolar 1 disorder ([place])   . IBS (irritable bowel syndrome)     Past Surgical History  Procedure Laterality Date  . Tubal  ligation    . Cervical biopsy    . Cholecystectomy     Family History: No family history on file. Family Psychiatric  History: none Social History:  History  Alcohol Use No     History  Drug Use No    Social History   Social History  . Marital Status: Married    Spouse Name: N/A  . Number of Children: N/A  . Years of Education: N/A   Social History Main Topics  . Smoking status: Former Smoker -- 0.25 packs/day for 0 years  . Smokeless tobacco: None  . Alcohol Use: No  . Drug Use: No  . Sexual Activity: Not Asked   Other Topics Concern  . None   Social History Narrative   Additional Social History:    Allergies:  No Known Allergies  Labs:  Results for orders placed or performed during the hospital encounter of 12/17/15 (from the past 48 hour(s))  Ethanol     Status: None   Collection Time: 12/17/15  9:57 PM  Result Value Ref Range   Alcohol, Ethyl (B) <5 <5 mg/dL    Comment:        LOWEST DETECTABLE LIMIT FOR SERUM ALCOHOL IS 5 mg/dL FOR MEDICAL PURPOSES ONLY   Salicylate level     Status: None   Collection Time: 12/17/15  9:57 PM  Result Value Ref Range   Salicylate Lvl <0.9 2.8 - 30.0 mg/dL  Acetaminophen level     Status: Abnormal   Collection Time: 12/17/15  9:57 PM  Result Value Ref Range   Acetaminophen (Tylenol), Serum <10 (L) 10 -  30 ug/mL    Comment:        THERAPEUTIC CONCENTRATIONS VARY SIGNIFICANTLY. A RANGE OF 10-30 ug/mL MAY BE AN EFFECTIVE CONCENTRATION FOR MANY PATIENTS. HOWEVER, SOME ARE BEST TREATED AT CONCENTRATIONS OUTSIDE THIS RANGE. ACETAMINOPHEN CONCENTRATIONS >150 ug/mL AT 4 HOURS AFTER INGESTION AND >50 ug/mL AT 12 HOURS AFTER INGESTION ARE OFTEN ASSOCIATED WITH TOXIC REACTIONS.   Comprehensive metabolic panel     Status: Abnormal   Collection Time: 12/17/15  9:58 PM  Result Value Ref Range   Sodium 136 135 - 145 mmol/L   Potassium 3.4 (L) 3.5 - 5.1 mmol/L   Chloride 104 101 - 111 mmol/L   CO2 23 22 - 32 mmol/L    Glucose, Bld 131 (H) 65 - 99 mg/dL   BUN 11 6 - 20 mg/dL   Creatinine, Ser 0.87 0.44 - 1.00 mg/dL   Calcium 9.5 8.9 - 10.3 mg/dL   Total Protein 8.3 (H) 6.5 - 8.1 g/dL   Albumin 4.6 3.5 - 5.0 g/dL   AST 36 15 - 41 U/L   ALT 27 14 - 54 U/L   Alkaline Phosphatase 71 38 - 126 U/L   Total Bilirubin 1.0 0.3 - 1.2 mg/dL   GFR calc non Af Amer >60 >60 mL/min   GFR calc Af Amer >60 >60 mL/min    Comment: (NOTE) The eGFR has been calculated using the CKD EPI equation. This calculation has not been validated in all clinical situations. eGFR's persistently <60 mL/min signify possible Chronic Kidney Disease.    Anion gap 9 5 - 15  cbc     Status: Abnormal   Collection Time: 12/17/15  9:58 PM  Result Value Ref Range   WBC 13.7 (H) 4.0 - 10.5 K/uL   RBC 4.65 3.87 - 5.11 MIL/uL   Hemoglobin 12.0 12.0 - 15.0 g/dL   HCT 36.1 36.0 - 46.0 %   MCV 77.6 (L) 78.0 - 100.0 fL   MCH 25.8 (L) 26.0 - 34.0 pg   MCHC 33.2 30.0 - 36.0 g/dL   RDW 16.8 (H) 11.5 - 15.5 %   Platelets 376 150 - 400 K/uL  Rapid urine drug screen (hospital performed)     Status: Abnormal   Collection Time: 12/18/15  1:09 AM  Result Value Ref Range   Opiates NONE DETECTED NONE DETECTED   Cocaine NONE DETECTED NONE DETECTED   Benzodiazepines NONE DETECTED NONE DETECTED   Amphetamines NONE DETECTED NONE DETECTED   Tetrahydrocannabinol POSITIVE (A) NONE DETECTED   Barbiturates NONE DETECTED NONE DETECTED    Comment:        DRUG SCREEN FOR MEDICAL PURPOSES ONLY.  IF CONFIRMATION IS NEEDED FOR ANY PURPOSE, NOTIFY LAB WITHIN 5 DAYS.        LOWEST DETECTABLE LIMITS FOR URINE DRUG SCREEN Drug Class       Cutoff (ng/mL) Amphetamine      1000 Barbiturate      200 Benzodiazepine   132 Tricyclics       440 Opiates          300 Cocaine          300 THC              50     Current Facility-Administered Medications  Medication Dose Route Frequency Provider Last Rate Last Dose  . busPIRone (BUSPAR) tablet 10 mg  10 mg Oral  Daily Tanna Furry, MD   10 mg at 12/18/15 0921  . trihexyphenidyl (ARTANE) tablet 5 mg  5 mg Oral BID WC Andora Krull, MD   5 mg at 12/18/15 1045  . ziprasidone (GEODON) capsule 80 mg  80 mg Oral QHS Corena Pilgrim, MD       Current Outpatient Prescriptions  Medication Sig Dispense Refill  . benztropine (COGENTIN) 0.5 MG tablet Take 0.5 mg by mouth at bedtime.    . busPIRone (BUSPAR) 10 MG tablet Take 10 mg by mouth daily.    . ziprasidone (GEODON) 80 MG capsule Take 80 mg by mouth at bedtime. Reported on 10/11/2015    . metroNIDAZOLE (FLAGYL) 500 MG tablet Take 1 tablet (500 mg total) by mouth 2 (two) times daily. (Patient not taking: Reported on 12/17/2015) 14 tablet 2    Musculoskeletal: Strength & Muscle Tone: within normal limits Gait & Station: normal Patient leans: N/A  Psychiatric Specialty Exam: Physical Exam  Constitutional: She is oriented to person, place, and time. She appears well-developed and well-nourished.  HENT:  Head: Normocephalic.  Neck: Normal range of motion.  Respiratory: Effort normal.  GI: Soft.  Musculoskeletal: Normal range of motion.  Neurological: She is alert and oriented to person, place, and time.  Skin: Skin is warm and dry.  Psychiatric: Her speech is normal and behavior is normal. Judgment and thought content normal. Her mood appears anxious.    Review of Systems  Constitutional: Negative.   HENT: Negative.   Eyes: Negative.   Respiratory: Negative.   Cardiovascular: Negative.   Gastrointestinal: Negative.   Genitourinary: Negative.   Musculoskeletal: Negative.   Skin: Negative.   Neurological: Negative.   Endo/Heme/Allergies: Negative.   Psychiatric/Behavioral: The patient is nervous/anxious.     Blood pressure 127/74, pulse 85, temperature 98.2 F (36.8 C), temperature source Oral, resp. rate 16, height _0  (1.803 m), weight 120.657 kg (266 lb), SpO2 100 %.Body mass index is 37.12 kg/(m^2).  General Appearance: Casual  Eye  Contact:  Good  Speech:  Normal Rate  Volume:  Normal  Mood:  Anxious  Affect:  Congruent  Thought Process:  Coherent and Descriptions of Associations: Intact  Orientation:  Full (Time, Place, and Person)  Thought Content:  Hallucinations: Auditory  Suicidal Thoughts:  No  Homicidal Thoughts:  No  Memory:  Immediate;   Good Recent;   Good Remote;   Good  Judgement:  Good  Insight:  Good  Psychomotor Activity:  Normal  Concentration:  Concentration: Good and Attention Span: Good  Recall:  Good  Fund of Knowledge:  Good  Language:  Good  Akathisia:  No  Handed:  Right  AIMS (if indicated):     Assets:  Communication Skills Desire for Improvement Housing Leisure Time Physical Health Resilience Social Support Talents/Skills Transportation Vocational/Educational  ADL's:  Intact  Cognition:  WNL  Sleep:        Treatment Plan Summary: Daily contact with patient to assess and evaluate symptoms and progress in treatment, Medication management and Plan bipolar affective disorder, mixed, with psychotic features:  -Crisis stabilization -Medication management:  Buspar 10 mg daily increased to 10 mg BID for anxiety, Geodon increased from 60 mg at bedtime to 80 mg for mood stabilization and hallucinations.  Cogentin 0.5 mg at bedtime for EPS discontinued, Artane 5 mg BID started for EPS -Individual counseling -Rx provided.  Disposition: No evidence of imminent risk to self or others at present.    Waylan Boga, NP 12/18/2015 10:58 AM Patient seen face-to-face for psychiatric evaluation, chart reviewed and case discussed with the physician extender and developed treatment plan.  Reviewed the information documented and agree with the treatment plan. Corena Pilgrim, MD

## 2016-01-09 ENCOUNTER — Ambulatory Visit (HOSPITAL_COMMUNITY)
Admission: AD | Admit: 2016-01-09 | Discharge: 2016-01-09 | Disposition: A | Discharge status: Home / Self Care | Payer: BC Managed Care – PPO | Attending: Psychiatry | Admitting: Psychiatry

## 2016-01-09 ENCOUNTER — Emergency Department (HOSPITAL_COMMUNITY)
Admission: EM | Admit: 2016-01-09 | Discharge: 2016-01-10 | Payer: BC Managed Care – PPO | Attending: Emergency Medicine | Admitting: Emergency Medicine

## 2016-01-09 ENCOUNTER — Encounter (HOSPITAL_COMMUNITY): Payer: Self-pay | Admitting: Emergency Medicine

## 2016-01-09 DIAGNOSIS — F312 Bipolar disorder, current episode manic severe with psychotic features: Secondary | ICD-10-CM | POA: Diagnosis not present

## 2016-01-09 DIAGNOSIS — F3110 Bipolar disorder, current episode manic without psychotic features, unspecified: Secondary | ICD-10-CM | POA: Insufficient documentation

## 2016-01-09 DIAGNOSIS — F200 Paranoid schizophrenia: Secondary | ICD-10-CM | POA: Diagnosis present

## 2016-01-09 DIAGNOSIS — R45851 Suicidal ideations: Secondary | ICD-10-CM | POA: Diagnosis not present

## 2016-01-09 DIAGNOSIS — Z79899 Other long term (current) drug therapy: Secondary | ICD-10-CM | POA: Diagnosis not present

## 2016-01-09 DIAGNOSIS — F1290 Cannabis use, unspecified, uncomplicated: Secondary | ICD-10-CM | POA: Diagnosis not present

## 2016-01-09 DIAGNOSIS — Z046 Encounter for general psychiatric examination, requested by authority: Secondary | ICD-10-CM | POA: Diagnosis present

## 2016-01-09 DIAGNOSIS — F17210 Nicotine dependence, cigarettes, uncomplicated: Secondary | ICD-10-CM | POA: Insufficient documentation

## 2016-01-09 DIAGNOSIS — Z62810 Personal history of physical and sexual abuse in childhood: Secondary | ICD-10-CM | POA: Diagnosis not present

## 2016-01-09 LAB — CBC
HCT: 33.6 % — ABNORMAL LOW (ref 36.0–46.0)
Hemoglobin: 11.4 g/dL — ABNORMAL LOW (ref 12.0–15.0)
MCH: 26.3 pg (ref 26.0–34.0)
MCHC: 33.9 g/dL (ref 30.0–36.0)
MCV: 77.6 fL — ABNORMAL LOW (ref 78.0–100.0)
Platelets: 368 10*3/uL (ref 150–400)
RBC: 4.33 MIL/uL (ref 3.87–5.11)
RDW: 15.9 % — ABNORMAL HIGH (ref 11.5–15.5)
WBC: 11.6 10*3/uL — ABNORMAL HIGH (ref 4.0–10.5)

## 2016-01-09 LAB — COMPREHENSIVE METABOLIC PANEL
ALT: 22 U/L (ref 14–54)
AST: 18 U/L (ref 15–41)
Albumin: 4.2 g/dL (ref 3.5–5.0)
Alkaline Phosphatase: 70 U/L (ref 38–126)
Anion gap: 7 (ref 5–15)
BUN: 8 mg/dL (ref 6–20)
CO2: 23 mmol/L (ref 22–32)
Calcium: 9.4 mg/dL (ref 8.9–10.3)
Chloride: 107 mmol/L (ref 101–111)
Creatinine, Ser: 0.75 mg/dL (ref 0.44–1.00)
GFR calc Af Amer: >60 mL/min (ref >60)
GFR calc non Af Amer: >60 mL/min (ref >60)
Glucose, Bld: 147 mg/dL — ABNORMAL HIGH (ref 65–99)
Potassium: 3.4 mmol/L — ABNORMAL LOW (ref 3.5–5.1)
Sodium: 137 mmol/L (ref 135–145)
Total Bilirubin: 0.8 mg/dL (ref 0.3–1.2)
Total Protein: 7.5 g/dL (ref 6.5–8.1)

## 2016-01-09 LAB — RAPID URINE DRUG SCREEN, HOSP PERFORMED
Amphetamines: NOT DETECTED
Barbiturates: NOT DETECTED
Benzodiazepines: NOT DETECTED
Cocaine: NOT DETECTED
Opiates: NOT DETECTED
Tetrahydrocannabinol: POSITIVE — AB

## 2016-01-09 LAB — ACETAMINOPHEN LEVEL: Acetaminophen (Tylenol), Serum: <10 ug/mL — ABNORMAL LOW (ref 10–30)

## 2016-01-09 LAB — SALICYLATE LEVEL: Salicylate Lvl: <4 mg/dL (ref 2.8–30.0)

## 2016-01-09 LAB — ETHANOL: Alcohol, Ethyl (B): <5 mg/dL (ref <5)

## 2016-01-09 MED ORDER — ONDANSETRON HCL 4 MG PO TABS: 4.0000 mg | ORAL_TABLET | Freq: Three times a day (TID) | ORAL | Status: DC | PRN

## 2016-01-09 MED ORDER — IBUPROFEN 200 MG PO TABS: 600.0000 mg | ORAL_TABLET | Freq: Three times a day (TID) | ORAL | Status: DC | PRN

## 2016-01-09 MED ORDER — ACETAMINOPHEN 325 MG PO TABS: 650.0000 mg | ORAL_TABLET | ORAL | Status: DC | PRN

## 2016-01-09 NOTE — ED Notes (Signed)
Pt brought over by [facility] for medical clearance.

## 2016-01-09 NOTE — ED Provider Notes (Signed)
CSN: [id number]     Arrival date & time 01/09/16  2118 History   First MD Initiated Contact with Patient 01/09/16 2130     Chief Complaint  Patient presents with  . Medical Clearance   HPI   [name] is a 40 y.o. female PMH significant for bipolar I disorder BIB [facility] for medical clearance. She is denying any HI, SI, hallucinations to this provider but states "I just want to apologize to all of my family and friends."    Past Medical History  Diagnosis Date  . Abnormal Pap smear   . Fibroids   . Bipolar 1 disorder ([place])   . IBS (irritable bowel syndrome)    Past Surgical History  Procedure Laterality Date  . Tubal ligation    . Cervical biopsy    . Cholecystectomy     No family history on file. Social History  Substance Use Topics  . Smoking status: Current Every Day Smoker -- 0.25 packs/day for 0 years    Types: Cigarettes  . Smokeless tobacco: None  . Alcohol Use: Yes     Comment: occasional   OB History    Gravida Para Term Preterm AB TAB SAB Ectopic Multiple Living   7 2 2  5 4  1  2      Review of Systems  Ten systems are reviewed and are negative for acute change except as noted in the HPI  Allergies  Review of patient's allergies indicates no known allergies.  Home Medications   Prior to Admission medications   Medication Sig Start Date End Date Taking? Authorizing Provider  busPIRone (BUSPAR) 10 MG tablet Take 1 tablet (10 mg total) by mouth 2 (two) times daily. 12/18/15  Yes Patrecia Pour, NP  dicyclomine (BENTYL) 20 MG tablet Take 20 mg by mouth 2 (two) times daily.   Yes Historical Provider, MD  trihexyphenidyl (ARTANE) 5 MG tablet Take 1 tablet (5 mg total) by mouth 2 (two) times daily with a meal. 12/18/15  Yes Patrecia Pour, NP  ziprasidone (GEODON) 80 MG capsule Take 1 capsule (80 mg total) by mouth at bedtime. Reported on 10/11/2015 12/18/15  Yes Patrecia Pour, NP   BP 158/93 mmHg  Pulse 79  Temp(Src) 98.8 F (37.1 C) (Oral)  Resp 18  SpO2  100% Physical Exam  Constitutional: She is oriented to person, place, and time. She appears well-developed and well-nourished. No distress.  HENT:  Head: Normocephalic and atraumatic.  Eyes: Conjunctivae are normal. Right eye exhibits no discharge. Left eye exhibits no discharge. No scleral icterus.  Neck: Normal range of motion.  Cardiovascular: Normal rate.   Pulmonary/Chest: Effort normal and breath sounds normal. No respiratory distress.  Abdominal: Soft. Bowel sounds are normal. She exhibits no distension.  Musculoskeletal: Normal range of motion. She exhibits no edema.  Neurological: She is alert and oriented to person, place, and time. No cranial nerve deficit. Coordination normal.  Skin: Skin is warm and dry. She is not diaphoretic.  Psychiatric:  Depressed appearing, tearful  Nursing note and vitals reviewed.   ED Course  Procedures  Labs Review Labs Reviewed  COMPREHENSIVE METABOLIC PANEL - Abnormal; Notable for the following:    Potassium 3.4 (*)    Glucose, Bld 147 (*)    All other components within normal limits  ACETAMINOPHEN LEVEL - Abnormal; Notable for the following:    Acetaminophen (Tylenol), Serum <10 (*)    All other components within normal limits  CBC - Abnormal; Notable  for the following:    WBC 11.6 (*)    Hemoglobin 11.4 (*)    HCT 33.6 (*)    MCV 77.6 (*)    RDW 15.9 (*)    All other components within normal limits  URINE RAPID DRUG SCREEN, HOSP PERFORMED - Abnormal; Notable for the following:    Tetrahydrocannabinol POSITIVE (*)    All other components within normal limits  ETHANOL  SALICYLATE LEVEL    Imaging Review No results found. I have personally reviewed and evaluated these images and lab results as part of my medical decision-making.   EKG Interpretation None      MDM   Final diagnoses:  Bipolar affective disorder, current episode manic, current episode severity unspecified ([facility])   CMP, ethanol, salicylate, acetaminophen  unremarkable. Nonspecific leukocytosis of 11.6. Urine drug screen positive for THC. Patient medically cleared at this time. TTS consult ordered.    [place], PA-C 01/23/16 2111  Lacretia Leigh, MD 02/01/16 [phone]

## 2016-01-09 NOTE — ED Notes (Signed)
Pt states her family thinks she needs help because of erratic behavior and smoking.  Pt very slow to answer questions with sporadic topic changes.
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
## 2016-01-10 DIAGNOSIS — R45851 Suicidal ideations: Secondary | ICD-10-CM | POA: Diagnosis not present

## 2016-01-10 DIAGNOSIS — F312 Bipolar disorder, current episode manic severe with psychotic features: Secondary | ICD-10-CM

## 2016-01-10 MED ORDER — DICYCLOMINE HCL 20 MG PO TABS
20.0000 mg | ORAL_TABLET | Freq: Two times a day (BID) | ORAL | Status: DC
  Administered 2016-01-10 (×2): 20 mg via ORAL
  Filled 2016-01-10 (×2): qty 1

## 2016-01-10 MED ORDER — BUSPIRONE HCL 10 MG PO TABS
10.0000 mg | ORAL_TABLET | Freq: Two times a day (BID) | ORAL | Status: DC
  Administered 2016-01-10 (×2): 10 mg via ORAL
  Filled 2016-01-10 (×2): qty 1

## 2016-01-10 MED ORDER — TRIHEXYPHENIDYL HCL 5 MG PO TABS
5.0000 mg | ORAL_TABLET | Freq: Two times a day (BID) | ORAL | Status: DC
  Administered 2016-01-10: 5 mg via ORAL
  Filled 2016-01-10 (×2): qty 1

## 2016-01-10 MED ORDER — BUSPIRONE HCL 10 MG PO TABS: 10.0000 mg | ORAL_TABLET | Freq: Three times a day (TID) | ORAL | Status: DC

## 2016-01-10 MED ORDER — ZIPRASIDONE HCL 20 MG PO CAPS
80.0000 mg | ORAL_CAPSULE | Freq: Every day | ORAL | Status: DC
  Administered 2016-01-10: 80 mg via ORAL
  Filled 2016-01-10: qty 4
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
## 2016-01-10 NOTE — BH Assessment (Addendum)
[place] Assessment Progress Note  Per Corena Pilgrim, MD, this pt requires psychiatric hospitalization at this time.  At 12:38 Roderic Palau calls from [facility] to report that pt has been accepted to their facility by Dr Alonna Minium.  Pt is currently under voluntary status, and this writer has spoken to pt and ascertained that she agrees to transfer.  At this time EDP is not available for staffing this decision.  When the time comes report is to be called to [phone].  Pt is to be transported via Stacey Drain, [place] Triage Specialist [phone]    Addendum:  At 13:42 I spoke to EDP Dr Alfonse Spruce, concurs with decision to transfer pt.  Pt's nurse, Diane, has been notified.  [name], [place] Triage Specialist [phone]

## 2016-01-10 NOTE — ED Notes (Signed)
Bed: [facility] Expected date:  Expected time:  Means of arrival:  Comments: RM 11
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
## 2016-01-10 NOTE — ED Notes (Signed)
Please contact [name] (sister)  once patient is sent to [facility]. [phone]

## 2016-01-10 NOTE — ED Notes (Signed)
Pt given graham crackers and peanut butter and juice.  Pt calm and cooperative at this time.

## 2016-01-10 NOTE — ED Notes (Signed)
Pt preferred to go home, but was cooperative and willing to go to [place]. All belongings returned to pt who signed for same. Transported to [place] by [employer].

## 2016-02-21 ENCOUNTER — Ambulatory Visit: Payer: Self-pay | Admitting: Family Medicine

## 2016-08-03 ENCOUNTER — Encounter (HOSPITAL_COMMUNITY): Payer: Self-pay | Admitting: *Deleted

## 2016-08-03 ENCOUNTER — Emergency Department (HOSPITAL_COMMUNITY)
Admission: EM | Admit: 2016-08-03 | Discharge: 2016-08-04 | Disposition: A | Discharge status: Other Acute Inpatient Hospital | Payer: BC Managed Care – PPO | Attending: Emergency Medicine | Admitting: Emergency Medicine

## 2016-08-03 DIAGNOSIS — R4182 Altered mental status, unspecified: Secondary | ICD-10-CM | POA: Insufficient documentation

## 2016-08-03 DIAGNOSIS — Z79899 Other long term (current) drug therapy: Secondary | ICD-10-CM | POA: Diagnosis not present

## 2016-08-03 DIAGNOSIS — F17210 Nicotine dependence, cigarettes, uncomplicated: Secondary | ICD-10-CM | POA: Diagnosis not present

## 2016-08-03 LAB — CBC
HCT: 34.7 % — ABNORMAL LOW (ref 36.0–46.0)
Hemoglobin: 11.2 g/dL — ABNORMAL LOW (ref 12.0–15.0)
MCH: 25.6 pg — ABNORMAL LOW (ref 26.0–34.0)
MCHC: 32.3 g/dL (ref 30.0–36.0)
MCV: 79.4 fL (ref 78.0–100.0)
Platelets: 365 10*3/uL (ref 150–400)
RBC: 4.37 MIL/uL (ref 3.87–5.11)
RDW: 16.3 % — ABNORMAL HIGH (ref 11.5–15.5)
WBC: 9 10*3/uL (ref 4.0–10.5)

## 2016-08-03 LAB — ACETAMINOPHEN LEVEL: Acetaminophen (Tylenol), Serum: <10 ug/mL — ABNORMAL LOW (ref 10–30)

## 2016-08-03 LAB — URINALYSIS, MICROSCOPIC (REFLEX)

## 2016-08-03 LAB — URINALYSIS, ROUTINE W REFLEX MICROSCOPIC
Glucose, UA: NEGATIVE mg/dL
Ketones, ur: >80 mg/dL — AB
Nitrite: POSITIVE — AB
Protein, ur: >300 mg/dL — AB
Specific Gravity, Urine: >1.03 — ABNORMAL HIGH (ref 1.005–1.030)
pH: 6.5 (ref 5.0–8.0)

## 2016-08-03 LAB — COMPREHENSIVE METABOLIC PANEL
ALT: 36 U/L (ref 14–54)
AST: 39 U/L (ref 15–41)
Albumin: 3.9 g/dL (ref 3.5–5.0)
Alkaline Phosphatase: 75 U/L (ref 38–126)
Anion gap: 13 (ref 5–15)
BUN: 10 mg/dL (ref 6–20)
CO2: 23 mmol/L (ref 22–32)
Calcium: 9.4 mg/dL (ref 8.9–10.3)
Chloride: 102 mmol/L (ref 101–111)
Creatinine, Ser: 0.82 mg/dL (ref 0.44–1.00)
GFR calc Af Amer: >60 mL/min (ref >60)
GFR calc non Af Amer: >60 mL/min (ref >60)
Glucose, Bld: 145 mg/dL — ABNORMAL HIGH (ref 65–99)
Potassium: 3.5 mmol/L (ref 3.5–5.1)
Sodium: 138 mmol/L (ref 135–145)
Total Bilirubin: 1.1 mg/dL (ref 0.3–1.2)
Total Protein: 7.2 g/dL (ref 6.5–8.1)

## 2016-08-03 LAB — CBG MONITORING, ED: Glucose-Capillary: 148 mg/dL — ABNORMAL HIGH (ref 65–99)

## 2016-08-03 LAB — PREGNANCY, URINE: Preg Test, Ur: NEGATIVE

## 2016-08-03 LAB — RAPID URINE DRUG SCREEN, HOSP PERFORMED
Amphetamines: NOT DETECTED
Barbiturates: NOT DETECTED
Benzodiazepines: NOT DETECTED
Cocaine: NOT DETECTED
Opiates: NOT DETECTED
Tetrahydrocannabinol: POSITIVE — AB

## 2016-08-03 LAB — ETHANOL: Alcohol, Ethyl (B): <5 mg/dL (ref <5)

## 2016-08-03 LAB — SALICYLATE LEVEL: Salicylate Lvl: <7 mg/dL (ref 2.8–30.0)

## 2016-08-03 MED ORDER — DEXTROSE 5 % IV SOLN
1.0000 g | Freq: Once | INTRAVENOUS | Status: AC
  Administered 2016-08-03: 1 g via INTRAVENOUS
  Filled 2016-08-03: qty 10

## 2016-08-03 MED ORDER — LORAZEPAM 1 MG PO TABS
2.0000 mg | ORAL_TABLET | Freq: Once | ORAL | Status: AC
  Administered 2016-08-03: 2 mg via ORAL
  Filled 2016-08-03: qty 2

## 2016-08-03 MED ORDER — SODIUM CHLORIDE 0.9 % IV BOLUS (SEPSIS)
1000.0000 mL | Freq: Once | INTRAVENOUS | Status: AC
  Administered 2016-08-03: 1000 mL via INTRAVENOUS

## 2016-08-03 MED ORDER — LORAZEPAM 2 MG/ML IJ SOLN: 2.0000 mg | Freq: Once | INTRAMUSCULAR | Status: AC

## 2016-08-03 MED ORDER — ZIPRASIDONE HCL 80 MG PO CAPS
80.0000 mg | ORAL_CAPSULE | Freq: Every day | ORAL | Status: DC
  Administered 2016-08-03: 80 mg via ORAL
  Filled 2016-08-03: qty 1
  Filled 2016-08-03: qty 4

## 2016-08-03 MED ORDER — BENZTROPINE MESYLATE 1 MG PO TABS
0.5000 mg | ORAL_TABLET | Freq: Every day | ORAL | Status: DC
  Administered 2016-08-03: 0.5 mg via ORAL
  Filled 2016-08-03: qty 1

## 2016-08-03 MED ORDER — BUSPIRONE HCL 10 MG PO TABS
10.0000 mg | ORAL_TABLET | Freq: Every day | ORAL | Status: DC
  Administered 2016-08-04: 10 mg via ORAL
  Filled 2016-08-03: qty 1
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
## 2016-08-03 NOTE — ED Notes (Signed)
Pt able to communicate with this RN. Pt observed talking on the phone upon entering the room. Pt states she only has pain when she walks. Pt asked another RN if she was still having her period.

## 2016-08-03 NOTE — ED Notes (Signed)
Pt ambulatory to the bathroom 

## 2016-08-03 NOTE — Consult Note (Signed)
08-03-2016  Pt was brought into [facility] voluntarily in a catatoni state. Medication recommendations were placed in Pt's chart by this provider.   Ativan 2mg  PO or IM, one time dose for catatonia   Restart home meds as indicated by pt's chart:  Cogentin 0.5mg  PO QHS for drug induced extrapyramidal symptoms Geodon 80mg  PO QHS for Bipolar disorder with psychosis Buspar 10mg  PO QD for anxiety   Ethelene Hal MSN, [place]

## 2016-08-03 NOTE — ED Notes (Signed)
Tele-psych at bedside.

## 2016-08-03 NOTE — ED Notes (Signed)
Pt talking on the phone with her sister

## 2016-08-03 NOTE — ED Notes (Signed)
Pt st's she is ready to go;  Advised pt that she needed to stay until all test were resulted.  Pt st's she understands.  Then pt ask if she could go outside for a few mins.  Again encouraged pt to wait for results.  Pt voices understanding.

## 2016-08-03 NOTE — ED Notes (Signed)
Pt ambulatory to bathroom without any problems 

## 2016-08-03 NOTE — ED Triage Notes (Signed)
Patient presents to ed via [facility] patient was at work and co-workers stated she was altered.states this has been going on for several days. Patient is very slow to react verbally. States she has been under stress lately , " states I have let some people down". She is very tearful,

## 2016-08-03 NOTE — ED Notes (Signed)
Pt refused urine a second time

## 2016-08-03 NOTE — ED Provider Notes (Signed)
Emergency Department Provider Note   I have reviewed the triage vital signs and the nursing notes.   HISTORY  Chief Complaint Altered Mental Status   HPI [name] is a 41 y.o. female with PMH of Bipolar disorder presents to the emergency pertinent for evaluation of altered mental status. He arrived by EMS from work where coworkers became concerned when she seemed confused. They noted her to be slow to react verbally and repetitive with her answers. Patient states that she is tired because she walked 2 miles to work and when she got there she realized it wouldn't be there anymore and tried to leave the building. She denies any pain. She denies any drug or alcohol abuse. She denies any thoughts of harming herself or other people. She denies any auditory or visual hallucinations.  The patient's sister states that she has been hospitalized for bipolar disorder in the past. She was maintained on Geodon until recently when she was switched to [place]. She reports not being compliant with this medication. She notes some recent stress of the anniversary of her friend's death. She is seemed slow to respond and confused for the past 24 hours.   The patient reports feeling like she just keeps answering the same question over and over in her head.    Past Medical History:  Diagnosis Date  . Abnormal Pap smear   . Bipolar 1 disorder ([place])   . Fibroids   . IBS (irritable bowel syndrome)     Patient Active Problem List   Diagnosis Date Noted  . Bipolar affective disorder, current episode manic with psychotic symptoms ([place]) 12/18/2015  . CHOLECYSTITIS, UNSPECIFIED 06/12/2008  . BILIARY COLIC [id number]  . CERUMEN IMPACTION, BILATERAL 01/17/2008  . PHARYNGITIS, VIRAL 01/17/2008  . NECK PAIN 01/17/2008    Past Surgical History:  Procedure Laterality Date  . CERVICAL BIOPSY    . CHOLECYSTECTOMY    . TUBAL LIGATION      Current Outpatient Rx  . Order #: [id number] Class: Normal  .  Order #: [id number] Class: Historical Med  . Order #: [id number] Class: Historical Med  . Order #: [id number] Class: Historical Med  . Order #: [id number] Class: Normal  . Order #: [id number] Class: Normal    Allergies Patient has no known allergies.  History reviewed. No pertinent family history.  Social History Social History  Substance Use Topics  . Smoking status: Current Every Day Smoker    Packs/day: 0.25    Years: 0.00    Types: Cigarettes  . Smokeless tobacco: Never Used  . Alcohol use Yes     Comment: occasional    Review of Systems  Constitutional: No fever/chills Eyes: No visual changes. ENT: No sore throat. Cardiovascular: Denies chest pain. Respiratory: Denies shortness of breath. Gastrointestinal: No abdominal pain.  No nausea, no vomiting.  No diarrhea.  No constipation. Genitourinary: Negative for dysuria. Musculoskeletal: Negative for back pain. Skin: Negative for rash. Neurological: Negative for headaches, focal weakness or numbness. Psychiatric:Repetitive thinking. Denies SI/HI and AH/VH  10-point ROS otherwise negative.  ____________________________________________   PHYSICAL EXAM:  VITAL SIGNS: ED Triage Vitals  Enc Vitals Group     BP 08/03/16 1328 145/83     Pulse Rate 08/03/16 1319 100     Resp 08/03/16 1319 18     Temp 08/03/16 1328 98.1 F (36.7 C)     Temp Source 08/03/16 1319 Oral     SpO2 08/03/16 1319 100 %   Constitutional: Awake but muted and slow to respond. Eyes: Conjunctivae  are normal. PERRL.  Head: Atraumatic. Nose: No congestion/rhinnorhea. Mouth/Throat: Mucous membranes are moist.  Oropharynx non-erythematous. Neck: No stridor.  Cardiovascular: Normal rate, regular rhythm. Good peripheral circulation. Grossly normal heart sounds.   Respiratory: Normal respiratory effort.  No retractions. Lungs CTAB. Gastrointestinal: Soft and nontender. No distention.  Musculoskeletal: No lower extremity tenderness nor edema. No gross  deformities of extremities. Neurologic:  Slow speech and language. No gross focal neurologic deficits are appreciated.  Skin:  Skin is warm, dry and intact. No rash noted. Psychiatric: Flat, muted affect. Seems to possibly be responding to internal stimulus.   ____________________________________________   LABS (all labs ordered are listed, but only abnormal results are displayed)  Labs Reviewed  COMPREHENSIVE METABOLIC PANEL - Abnormal; Notable for the following:       Result Value   Glucose, Bld 145 (*)    All other components within normal limits  CBC - Abnormal; Notable for the following:    Hemoglobin 11.2 (*)    HCT 34.7 (*)    MCH 25.6 (*)    RDW 16.3 (*)    All other components within normal limits  ACETAMINOPHEN LEVEL - Abnormal; Notable for the following:    Acetaminophen (Tylenol), Serum <10 (*)    All other components within normal limits  URINALYSIS, ROUTINE W REFLEX MICROSCOPIC - Abnormal; Notable for the following:    Color, Urine ORANGE (*)    APPearance CLOUDY (*)    Specific Gravity, Urine >1.030 (*)    Hgb urine dipstick LARGE (*)    Bilirubin Urine MODERATE (*)    Ketones, ur >80 (*)    Protein, ur >300 (*)    Nitrite POSITIVE (*)    Leukocytes, UA TRACE (*)    All other components within normal limits  RAPID URINE DRUG SCREEN, HOSP PERFORMED - Abnormal; Notable for the following:    Tetrahydrocannabinol POSITIVE (*)    All other components within normal limits  URINALYSIS, MICROSCOPIC (REFLEX) - Abnormal; Notable for the following:    Bacteria, UA MANY (*)    Squamous Epithelial / LPF 6-30 (*)    All other components within normal limits  CBG MONITORING, ED - Abnormal; Notable for the following:    Glucose-Capillary 148 (*)    All other components within normal limits  ETHANOL  SALICYLATE LEVEL  PREGNANCY, URINE   ____________________________________________   PROCEDURES  Procedure(s) performed:    Procedures  None ____________________________________________   INITIAL IMPRESSION / ASSESSMENT AND PLAN / ED COURSE  Pertinent labs & imaging results that were available during my care of the patient were reviewed by me and considered in my medical decision making (see chart for details).  Patient resents to the emergency department for evaluation of confusion. She has a history of bipolar disorder and has not been compliant with her medications. The patient's sister at bedside states that this is similar to prior "psychiatric breaks." She has required hospitalization in the past. No evidence of head trauma. Patient does seem to be responding to some internal stimulus but denies any auditory or visual elucidation's. She denies any homicidal or suicidal ideation. Given the patient's mental status do not feel she is safe to be discharged at this time. She is willing to stay for medical clearing lab work and psychiatry evaluation if no other medical reason for her behavior is found. Patient had a very similar psychiatry presentation in 01/2016 which led to psychiatric hospitalization at that time.    ____________________________________________  FINAL CLINICAL IMPRESSION(S) /  ED DIAGNOSES  Final diagnoses:  Altered mental status, unspecified altered mental status type     MEDICATIONS GIVEN DURING THIS VISIT:  Medications  benztropine (COGENTIN) tablet 0.5 mg (0.5 mg Oral Given 08/03/16 2314)  ziprasidone (GEODON) capsule 80 mg (80 mg Oral Given 08/03/16 2314)  busPIRone (BUSPAR) tablet 10 mg (10 mg Oral Given 08/04/16 0906)  LORazepam (ATIVAN) tablet 2 mg (2 mg Oral Given 08/03/16 1824)    Or  LORazepam (ATIVAN) injection 2 mg ( Intramuscular See Alternative 08/03/16 1824)  sodium chloride 0.9 % bolus 1,000 mL (0 mLs Intravenous Stopped 08/03/16 2030)  cefTRIAXone (ROCEPHIN) 1 g in dextrose 5 % 50 mL IVPB (0 g Intravenous Stopped 08/03/16 1958)  LORazepam (ATIVAN) tablet 2 mg (2 mg Oral Given  08/04/16 0916)     NEW OUTPATIENT MEDICATIONS STARTED DURING THIS VISIT:  None   Note:  This document was prepared using Dragon voice recognition software and may include unintentional dictation errors.  Nanda Quinton, MD Emergency Medicine   Margette Fast, MD 08/04/16 [phone]

## 2016-08-03 NOTE — ED Notes (Signed)
Pt observed talking coherently on the phone, but did not acknowledge me.

## 2016-08-03 NOTE — ED Notes (Signed)
Pt wanded by security and personal belongings removed.

## 2016-08-03 NOTE — ED Notes (Signed)
Upon returning from the restroom to get urinalysis, patient stated that she "forgot to put the pee in the cup." Will try again later.

## 2016-08-04 ENCOUNTER — Encounter (HOSPITAL_COMMUNITY): Payer: Self-pay

## 2016-08-04 ENCOUNTER — Inpatient Hospital Stay (HOSPITAL_COMMUNITY)
Admission: AD | Admit: 2016-08-04 | Discharge: 2016-08-14 | DRG: 885 | Disposition: A | Discharge status: Home / Self Care | Payer: BC Managed Care – PPO | Attending: Psychiatry | Admitting: Psychiatry

## 2016-08-04 DIAGNOSIS — F319 Bipolar disorder, unspecified: Secondary | ICD-10-CM | POA: Diagnosis present

## 2016-08-04 DIAGNOSIS — F312 Bipolar disorder, current episode manic severe with psychotic features: Secondary | ICD-10-CM | POA: Diagnosis not present

## 2016-08-04 DIAGNOSIS — G4700 Insomnia, unspecified: Secondary | ICD-10-CM | POA: Diagnosis not present

## 2016-08-04 DIAGNOSIS — F1220 Cannabis dependence, uncomplicated: Secondary | ICD-10-CM | POA: Diagnosis present

## 2016-08-04 DIAGNOSIS — Z9851 Tubal ligation status: Secondary | ICD-10-CM | POA: Diagnosis not present

## 2016-08-04 DIAGNOSIS — Z7984 Long term (current) use of oral hypoglycemic drugs: Secondary | ICD-10-CM | POA: Diagnosis not present

## 2016-08-04 DIAGNOSIS — F3164 Bipolar disorder, current episode mixed, severe, with psychotic features: Principal | ICD-10-CM | POA: Diagnosis present

## 2016-08-04 DIAGNOSIS — R443 Hallucinations, unspecified: Secondary | ICD-10-CM | POA: Diagnosis not present

## 2016-08-04 DIAGNOSIS — Z833 Family history of diabetes mellitus: Secondary | ICD-10-CM

## 2016-08-04 DIAGNOSIS — E119 Type 2 diabetes mellitus without complications: Secondary | ICD-10-CM | POA: Diagnosis present

## 2016-08-04 DIAGNOSIS — F17210 Nicotine dependence, cigarettes, uncomplicated: Secondary | ICD-10-CM | POA: Diagnosis present

## 2016-08-04 DIAGNOSIS — F1990 Other psychoactive substance use, unspecified, uncomplicated: Secondary | ICD-10-CM | POA: Diagnosis not present

## 2016-08-04 DIAGNOSIS — Z9049 Acquired absence of other specified parts of digestive tract: Secondary | ICD-10-CM | POA: Diagnosis not present

## 2016-08-04 DIAGNOSIS — Z794 Long term (current) use of insulin: Secondary | ICD-10-CM | POA: Diagnosis not present

## 2016-08-04 DIAGNOSIS — Z79899 Other long term (current) drug therapy: Secondary | ICD-10-CM | POA: Diagnosis not present

## 2016-08-04 DIAGNOSIS — R4182 Altered mental status, unspecified: Secondary | ICD-10-CM | POA: Diagnosis not present

## 2016-08-04 DIAGNOSIS — Z9889 Other specified postprocedural states: Secondary | ICD-10-CM | POA: Diagnosis not present

## 2016-08-04 DIAGNOSIS — F1099 Alcohol use, unspecified with unspecified alcohol-induced disorder: Secondary | ICD-10-CM | POA: Diagnosis not present

## 2016-08-04 DIAGNOSIS — Z818 Family history of other mental and behavioral disorders: Secondary | ICD-10-CM | POA: Diagnosis not present

## 2016-08-04 MED ORDER — BENZTROPINE MESYLATE 0.5 MG PO TABS
0.5000 mg | ORAL_TABLET | Freq: Every day | ORAL | Status: DC
  Administered 2016-08-04: 0.5 mg via ORAL
  Filled 2016-08-04 (×4): qty 1

## 2016-08-04 MED ORDER — TRAZODONE HCL 50 MG PO TABS
50.0000 mg | ORAL_TABLET | Freq: Every evening | ORAL | Status: DC | PRN
  Administered 2016-08-04 – 2016-08-06 (×3): 50 mg via ORAL
  Filled 2016-08-04 (×3): qty 1

## 2016-08-04 MED ORDER — ZIPRASIDONE HCL 80 MG PO CAPS
80.0000 mg | ORAL_CAPSULE | Freq: Every day | ORAL | Status: DC
  Administered 2016-08-04: 80 mg via ORAL
  Filled 2016-08-04 (×2): qty 1
  Filled 2016-08-04: qty 2
  Filled 2016-08-04: qty 1

## 2016-08-04 MED ORDER — ACETAMINOPHEN 325 MG PO TABS: 650.0000 mg | ORAL_TABLET | Freq: Four times a day (QID) | ORAL | Status: DC | PRN

## 2016-08-04 MED ORDER — LORAZEPAM 1 MG PO TABS
2.0000 mg | ORAL_TABLET | Freq: Once | ORAL | Status: AC
  Administered 2016-08-04: 2 mg via ORAL
  Filled 2016-08-04: qty 2

## 2016-08-04 MED ORDER — BUSPIRONE HCL 10 MG PO TABS
10.0000 mg | ORAL_TABLET | Freq: Every day | ORAL | Status: DC
  Administered 2016-08-05: 10 mg via ORAL
  Filled 2016-08-04 (×3): qty 1
  Filled 2016-08-04: qty 2

## 2016-08-04 MED ORDER — MAGNESIUM HYDROXIDE 400 MG/5ML PO SUSP: 30.0000 mL | Freq: Every day | ORAL | Status: DC | PRN

## 2016-08-04 MED ORDER — HYDROXYZINE HCL 25 MG PO TABS
25.0000 mg | ORAL_TABLET | Freq: Four times a day (QID) | ORAL | Status: DC | PRN
  Administered 2016-08-05 (×2): 25 mg via ORAL
  Filled 2016-08-04 (×2): qty 1

## 2016-08-04 MED ORDER — ALUM & MAG HYDROXIDE-SIMETH 200-200-20 MG/5ML PO SUSP: 30.0000 mL | ORAL | Status: DC | PRN

## 2016-08-04 NOTE — ED Notes (Signed)
Belongings inventoried. One envelope taken to security and one bag locked in lower cabinet of pt's room. Signature obtained from pt.

## 2016-08-04 NOTE — ED Notes (Signed)
Pt sister called and was very agitated rt this RN not giving pt information over the phone. Family member informed the pt could tell her the plan of care if she chooses. Family member hung up on this RN. Pt called mother and told her the plan of care, states she didn't want to tell her sister.

## 2016-08-04 NOTE — ED Notes (Addendum)
Patient sister called to speak to her, but she was sleeping so her Sister left her name and Number and ask if, Haila could call her back before she leaves or call her when she get to the other place she's going, because she lives out of town, Her sister name is Avon  F.  [phone].

## 2016-08-04 NOTE — ED Notes (Signed)
Called [facility] to transport pt to [facility].  

## 2016-08-04 NOTE — Progress Notes (Signed)
[name] is a 41 year old female being admitted involuntarily to [facility] from [facility].  She presented to the ED via EMS called by her coworkers.  Her coworkers were concerned because she wasn't acting like herself.  She was confused and just walked 2 miles to work because her car had broken down.  Upon arrival to the ED, she was confused and exhibiting thought blocking.  Sister provided information to [facility] assessment stating she had a medication change June 2017 after two family friends had passed away unexpectantly.   She denies SI/HI or A/V hallucinations.  During [facility] admission, she reported that she hears conversations in her head and "I talk back to them when I am alone."  She was very confused, scared and tearful.  She was concerned that she has trouble understanding what people are trying to ask her and stated "why can't I understand?" She reported, since the medication change, she hasn't been taking her Latuda on a regular basis and said that "I was taking it when I felt like I needed it and I was feeling pretty good."  She denies any physical issues and appears to be in no physical distress.  Oriented her to the unit.  Admission paperwork completed and signed.  Belongings searched and secured in locker # 50.  Skin assessment completed and no skin issues noted.  Q 15 minute checks initiated for safety.  We will monitor the progress towards her goals.

## 2016-08-04 NOTE — Tx Team (Signed)
Initial Treatment Plan 08/04/2016 9:02 PM [name] [id number]    PATIENT STRESSORS: Medication change or noncompliance Occupational concerns   PATIENT STRENGTHS: Financial means General fund of knowledge Physical Health Supportive family/friends   PATIENT IDENTIFIED PROBLEMS: Anxiety   Psychosis  "I would like to learn how to be a professional without gossiping about my personal life"  "Stop thinking that I need more things to be happy, I should be happy where I'm at."               DISCHARGE CRITERIA:  Improved stabilization in mood, thinking, and/or behavior Verbal commitment to aftercare and medication compliance  PRELIMINARY DISCHARGE PLAN: Outpatient therapy Medication management  PATIENT/FAMILY INVOLVEMENT: This treatment plan has been presented to and reviewed with the patient, [name].  The patient and family have been given the opportunity to ask questions and make suggestions.,  Windell Moment, RN 08/04/2016, 9:02 PM

## 2016-08-04 NOTE — ED Notes (Signed)
Pt out to nurses station requesting to make a phone call on her cell phone, advised patient she can use our phone at the desk. Pt up to phone and dialing random numbers on telephone, states she is not sure who she is trying to call or what their number is. Pt back to room.

## 2016-08-04 NOTE — ED Notes (Addendum)
Pt out of her room, addressing one of the sitters about something in a very quiet tone. Pt seems to be agitated/somewhat disturbed by noise of people talking at the nurse's station. Pt also asking again about if she would be "going home tomorrow". Seems preoccupied with being able to coordinate what's going on with her separated husband and two children.   Open the blinds on the door and closed room door to help reduce the noise. Also encouraged sitters and other staff currently present to lower voices to allow for better rest.

## 2016-08-04 NOTE — ED Notes (Signed)
Pt pacing the halls, very fidgety and restless. MD [facility] made aware, given order for po ativan.

## 2016-08-04 NOTE — Progress Notes (Signed)
Patient ID: [name], female   DOB: 1975/10/02, 41 y.o.   MRN: [id number]  D: Patient appears anxious on approach tonight. Has some thought blocking when asking her questions. Sometimes has some difficulty trying to give an answer. Does endorse recent depression and she expresses guilt type feeling over some recent incidents saying that she may be the cause of some of the negative things that have happened. She did not divulge what she meant by this comment. She does appear to have some paranoia. Has also been ruminating about rules and needing frequent reassurance here. Tearful one time and she went to room. Had difficulty explaining why she was crying.  A: Staff will monitor on q 15 minute checks, follow treatment plan, and give medications as ordered R: Cooperative on the unit and took PO medication tonight.

## 2016-08-04 NOTE — ED Notes (Signed)
Lunch has been ordered  

## 2016-08-04 NOTE — ED Notes (Signed)
Pt is in stable condition upon d/c and ambulates to police car in custody. Pt to be transferred to [facility].

## 2016-08-04 NOTE — ED Notes (Signed)
Regular breakfast tray ordered.  

## 2016-08-05 ENCOUNTER — Encounter (HOSPITAL_COMMUNITY): Payer: Self-pay

## 2016-08-05 MED ORDER — HALOPERIDOL 5 MG PO TABS
5.0000 mg | ORAL_TABLET | Freq: Four times a day (QID) | ORAL | Status: DC | PRN
  Administered 2016-08-06: 5 mg via ORAL
  Filled 2016-08-05: qty 1

## 2016-08-05 MED ORDER — ARIPIPRAZOLE 10 MG PO TABS
20.0000 mg | ORAL_TABLET | Freq: Every day | ORAL | Status: DC
  Administered 2016-08-06 – 2016-08-07 (×2): 20 mg via ORAL
  Filled 2016-08-05 (×4): qty 2

## 2016-08-05 MED ORDER — BENZTROPINE MESYLATE 0.5 MG PO TABS
0.5000 mg | ORAL_TABLET | Freq: Two times a day (BID) | ORAL | Status: DC | PRN
  Administered 2016-08-06 – 2016-08-07 (×2): 0.5 mg via ORAL
  Filled 2016-08-05 (×2): qty 1

## 2016-08-05 MED ORDER — LORAZEPAM 1 MG PO TABS
1.0000 mg | ORAL_TABLET | Freq: Two times a day (BID) | ORAL | Status: DC
  Administered 2016-08-05: 1 mg via ORAL
  Filled 2016-08-05: qty 1

## 2016-08-05 MED ORDER — ARIPIPRAZOLE 10 MG PO TABS
10.0000 mg | ORAL_TABLET | Freq: Once | ORAL | Status: AC
Start: 2016-08-05 — End: 2016-08-05
  Administered 2016-08-05: 10 mg via ORAL
  Filled 2016-08-05 (×2): qty 1

## 2016-08-05 MED ORDER — TRAZODONE HCL 50 MG PO TABS
50.0000 mg | ORAL_TABLET | Freq: Once | ORAL | Status: AC
  Administered 2016-08-05: 50 mg via ORAL
  Filled 2016-08-05 (×2): qty 1

## 2016-08-05 NOTE — Progress Notes (Signed)
Writer spoke with patient 1:1 and she reported that she has been trying to keep up with everything. When writer explained to her that shift change had taken place and writer would be her nurse for the night she stared at Probation officer as if she was attempting to process what Probation officer was saying. She reports that she needs to write things down so that she can keep up. Writer offered her a journal but she reported that she ha one in the dayroom. Writer informed her of medications available if needed. She reports not sleeping well last night. Writer informed her that she has trazadone available. Support given and safety maintained on unit with 15 min checks.

## 2016-08-05 NOTE — [facility] Group Notes (Addendum)
[facility] Group Notes:  (Clinical Social Work)  08/05/2016  11:15-12:00PM  Summary of Progress/Problems:   Today's process group involved patients discussing their feelings related to being hospitalized, both positive and negative.  These were itemized on the whiteboard, and then the group brainstormed specific ways in which they could seek to either promote the positive feelings or cope with the negative ones when they occur after discharge. The patient expressed herself with less hesitation and confusion than during earlier contacts in the day.  Type of Therapy:  Group Therapy - Process  Participation Level:  Active  Participation Quality:  Attentive  Affect:  Anxious  Cognitive:  Disorganized and Confused  Insight:  Developing/Improving  Engagement in Therapy:  Engaged  Modes of Intervention:  Exploration, Discussion  Selmer Dominion, LCSW 08/05/2016, 12:54 PM

## 2016-08-05 NOTE — Progress Notes (Signed)
[facility] Group Notes:  (Nursing/MHT/Case Management/Adjunct)  Date:  08/05/2016  Time:  9:03 PM  Type of Therapy:  Psychoeducational Skills  Participation Level:  Minimal  Participation Quality:  Attentive  Affect:  Flat  Cognitive:  Appropriate  Insight:  Good  Engagement in Group:  Limited  Modes of Intervention:  Education  Summary of Progress/Problems: The patient was initially slow to respond, but with time she verbalized that she had an "adventurous" day. She did not respond when asked to mention something about her day. She did state that she is presently working on focusing more on herself and that she is "sorting things out". In terms of the theme for the day, her coping skill will be to manage her time better and live "drug free".   Archie Balboa S 08/05/2016, 9:03 PM

## 2016-08-05 NOTE — Progress Notes (Signed)
Patient denies SI, and AVH patient reports feeling confused and anxious.  Patient was noted to be tremulous and slow to respond when asked questions.   Assess patient for safety, continue to monitor as prescribed, engage patient in 1:1 staff talks.   Patient able to contract for safety, Continue to monitor as prescribed

## 2016-08-05 NOTE — [facility] Counselor (Signed)
Clinical Social Work Note  Psychosocial Assessment was attempted with patient, who was too confused and paranoid at that time to provide meaningful information.  CSW assisted her in making a phone to her sister [name], see Suicide Risk Prevention note.  PSA to be tried again 08/06/16.  Selmer Dominion, LCSW 08/05/2016, 1:12 PM

## 2016-08-05 NOTE — [facility] Suicide Risk Assessment (Signed)
[facility] Admission Suicide Risk Assessment   Nursing information obtained from:  Patient Demographic factors:  Living alone Current Mental Status:  NA Loss Factors:  NA Historical Factors:  NA Risk Reduction Factors:  Responsible for children under 39 years of age, Employed  Total Time spent with patient: 30 minutes Principal Problem: Bipolar affective disorder, current episode manic with psychotic symptoms ([place]) Diagnosis:   Patient Active Problem List   Diagnosis Date Noted  . Affective psychosis, bipolar ([place]) [F31.9] 08/04/2016  . Bipolar affective disorder, current episode manic with psychotic symptoms ([place]) [F31.2] 12/18/2015  . CHOLECYSTITIS, UNSPECIFIED [K81.9] 06/12/2008  . BILIARY COLIC [id number]  . CERUMEN IMPACTION, BILATERAL [H61.20] 01/17/2008  . PHARYNGITIS, VIRAL [J02.9] 01/17/2008  . NECK PAIN [M54.2] 01/17/2008   Subjective Data: 41 yo AAF with background history of schizophrenia. She was brought in involuntarily by her family. She has been very disorganized and muddled up. Her medications were recently adjusted. Significant labs shows evidence of UTI. She was positive for THC. At interview, patient is very perplexed and internally distracted. She looks scared and not able to express how she really feels. She agreed to use of Abilify and Ativan.   Continued Clinical Symptoms:  Alcohol Use Disorder Identification Test Final Score (AUDIT): 1 The "Alcohol Use Disorders Identification Test", Guidelines for Use in Primary Care, Second Edition.  World Pharmacologist [facility]). Score between 0-7:  no or low risk or alcohol related problems. Score between 8-15:  moderate risk of alcohol related problems. Score between 16-19:  high risk of alcohol related problems. Score 20 or above:  warrants further diagnostic evaluation for alcohol dependence and treatment.   CLINICAL FACTORS:   Florid psychosis   Musculoskeletal: Strength & Muscle Tone: within normal  limits Gait & Station: odd and slow Patient leans: N/A  Psychiatric Specialty Exam: Physical Exam Unable to assess  ROS unable to assess  Blood pressure (!) 160/81, pulse (!) 106, temperature 97.8 F (36.6 C), resp. rate 18, height 5\' 11"  (1.803 m), weight 132.5 kg (292 lb), last menstrual period 08/03/2016, SpO2 100 %.Body mass index is 40.73 kg/m.  General Appearance: Poor self care, blinking excessively, blank gaze and seems internally distracted  Eye Contact:  Poor  Speech:  Delayed initiation  Volume:  Decreased  Mood:  overwhelmed by her subjective experience  Affect: odd and has a psychotic quality   Thought Process:  Poverty of thought  Orientation:  Unable to assess  Thought Content:  Unable to assess  Suicidal Thoughts:  Unable to assess  Homicidal Thoughts:  Unable to assess  Memory:  Unable to assess  Judgement:  Impaired  Insight:  Fair  Psychomotor Activity:  Psychomotor Retardation  Concentration:  distractible  Recall:  Unable to assess  Fund of Knowledge:  Unable to assess  Language:  Unable to assess  Akathisia:  No  Handed:    AIMS (if indicated):     Assets:  Unable to assess  ADL's:  Impaired  Cognition:  Impaired,  Moderate  Sleep:  Number of Hours: 4      COGNITIVE FEATURES THAT CONTRIBUTE TO RISK:  Loss of executive function    SUICIDE RISK:   Moderate:  Frequent suicidal ideation with limited intensity, and duration, some specificity in terms of plans, no associated intent, good self-control, limited dysphoria/symptomatology, some risk factors present, and identifiable protective factors, including available and accessible social support.  PLAN OF CARE:  Patient is floridly psychotic. Seems to have not responded to [place] and  [place]. Would try her on Abilify. Would hopefully transition to [employer] formulary if she responds.   I certify that inpatient services furnished can reasonably be expected to improve the patient's condition.   [place], MD 08/05/2016, 4:52 PM

## 2016-08-05 NOTE — H&P (Signed)
Psychiatric Admission Assessment Adult  Patient Identification: [name]  MRN:  [id number]  Date of Evaluation:  08/05/2016  Chief Complaint:  bipolar disorder by history most recent mixed with psychotic features  Principal Diagnosis: Bipolar affective disorder, current episode manic with psychotic symptoms ([place])  Diagnosis:   Patient Active Problem List   Diagnosis Date Noted  . Affective psychosis, bipolar ([place]) [F31.9] 08/04/2016  . Bipolar affective disorder, current episode manic with psychotic symptoms ([place]) [F31.2] 12/18/2015  . CHOLECYSTITIS, UNSPECIFIED [K81.9] 06/12/2008  . BILIARY COLIC [R44.31] 54/00/8676  . CERUMEN IMPACTION, BILATERAL [H61.20] 01/17/2008  . PHARYNGITIS, VIRAL [J02.9] 01/17/2008  . NECK PAIN [M54.2] 01/17/2008   History of Present Illness: This is an admission assessment for this 41 year old African-American female. Admitted to the [facility] adult unit from the [facility] with complaints of altered mental status. Patient at this time is a poor historian. Her memory is in bits & pieces. She is trying very hard to process information. She is tearful, slow to respond, stares at people, presents with thought blocking, guilt & appears to be responding to some internal stimuli".  During this assessment, [name] reports, "I went to the [facility] yesterday. The ambulance took me. I went to get some help because I needed help when I get disturbed. A lady from here reviewed my medicines. She told me to wait here. I take Latuda, Geodon. When I'm in a facility, I was doing too much. I was not taking my medicines or eating properly because I was trying to care for my kids. I was not able to keep up with the deadline. Then, I get criticized. I'm here because, I need to change or I have not changed. I need to follow through with everything that needed to be done. I'm here just to do that. I have been in a facility before, I think it is this one. I have not made any  calls at all. I feel like I have lost something. I need to call my husband, sister or mom. My sister's name is [name]. If I call my sister, it could be, [name], [name] & still is [name]".  Associated Signs/Symptoms:  Depression Symptoms: Although, patient denies any symptoms of depression, she presents with a constricted affect, she is tearful as well as confused to some degree.  (Hypo) Manic Symptoms:  Distractibility, Slow to respond to assessment questions,   Anxiety Symptoms:  Excessive worry, seem anxious, uneasy posture  Psychotic Symptoms:  Admits auditory hallucinations, appears to be responding to some internal stimuli.  PTSD Symptoms: None reported  Total Time spent with patient: 1 hour  Past Psychiatric History: Bipolar disorder  Is the patient at risk to self? No.  Has the patient been a risk to self in the past 6 months? No.  Has the patient been a risk to self within the distant past? No.  Is the patient a risk to others? No.  Has the patient been a risk to others in the past 6 months? No.  Has the patient been a risk to others within the distant past? No.   Prior Inpatient Therapy: Yes Prior Outpatient Therapy: Yes, ([facility]).  Alcohol Screening: 1. How often do you have a drink containing alcohol?: Monthly or less 2. How many drinks containing alcohol do you have on a typical day when you are drinking?: 1 or 2 3. How often do you have six or more drinks on one occasion?: Never Preliminary Score: 0 9. Have you  or someone else been injured as a result of your drinking?: No 10. Has a relative or friend or a doctor or another health worker been concerned about your drinking or suggested you cut down?: No Alcohol Use Disorder Identification Test Final Score (AUDIT): 1 Brief Intervention: AUDIT score less than 7 or less-screening does not suggest unhealthy drinking-brief intervention not indicated  Substance Abuse History in the last 12 months:  Yes.  (UDS positive  for THC)  Consequences of Substance Abuse: Medical Consequences:  Liver damage, Possible death by overdose Legal Consequences:  Arrests, jail time, Loss of driving privilege. Family Consequences:  Family discord, divorce and or separation.  Previous Psychotropic Medications: Yes Anette Guarneri, Geodon)  Psychological Evaluations: Yes   Past Medical History:  Past Medical History:  Diagnosis Date  . Abnormal Pap smear   . Bipolar 1 disorder ([place])   . Fibroids   . IBS (irritable bowel syndrome)     Past Surgical History:  Procedure Laterality Date  . CERVICAL BIOPSY    . CHOLECYSTECTOMY    . TUBAL LIGATION     Family History: History reviewed. No pertinent family history.  Family Psychiatric  History: Patient unable to provide this information due to altered mental state.  Tobacco Screening: Have you used any form of tobacco in the last 30 days? (Cigarettes, Smokeless Tobacco, Cigars, and/or Pipes): Yes Tobacco use, Select all that apply: 4 or less cigarettes per day Are you interested in Tobacco Cessation Medications?: No, patient refused Counseled patient on smoking cessation including recognizing danger situations, developing coping skills and basic information about quitting provided: Refused/Declined practical counseling  Social History:  History  Alcohol Use  . Yes    Comment: occasional     History  Drug Use    Comment: CBD oil    Additional Social History: Pain Medications: denies Prescriptions: See mar Over the Counter: none History of alcohol / drug use?: No history of alcohol / drug abuse  Allergies:  No Known Allergies  Lab Results:  Results for orders placed or performed during the hospital encounter of 08/03/16 (from the past 48 hour(s))  Pregnancy, urine     Status: None   Collection Time: 08/03/16  1:47 PM  Result Value Ref Range   Preg Test, Ur NEGATIVE NEGATIVE    Comment:        THE SENSITIVITY OF THIS METHODOLOGY IS >20 mIU/mL.   Urinalysis,  Routine w reflex microscopic     Status: Abnormal   Collection Time: 08/03/16  1:47 PM  Result Value Ref Range   Color, Urine ORANGE (A) YELLOW    Comment: BIOCHEMICALS MAY BE AFFECTED BY COLOR   APPearance CLOUDY (A) CLEAR   Specific Gravity, Urine >1.030 (H) 1.005 - 1.030   pH 6.5 5.0 - 8.0   Glucose, UA NEGATIVE NEGATIVE mg/dL   Hgb urine dipstick LARGE (A) NEGATIVE   Bilirubin Urine MODERATE (A) NEGATIVE   Ketones, ur >80 (A) NEGATIVE mg/dL   Protein, ur >300 (A) NEGATIVE mg/dL   Nitrite POSITIVE (A) NEGATIVE   Leukocytes, UA TRACE (A) NEGATIVE  Urine rapid drug screen (hosp performed)     Status: Abnormal   Collection Time: 08/03/16  1:47 PM  Result Value Ref Range   Opiates NONE DETECTED NONE DETECTED   Cocaine NONE DETECTED NONE DETECTED   Benzodiazepines NONE DETECTED NONE DETECTED   Amphetamines NONE DETECTED NONE DETECTED   Tetrahydrocannabinol POSITIVE (A) NONE DETECTED   Barbiturates NONE DETECTED NONE DETECTED  Comment:        DRUG SCREEN FOR MEDICAL PURPOSES ONLY.  IF CONFIRMATION IS NEEDED FOR ANY PURPOSE, NOTIFY LAB WITHIN 5 DAYS.        LOWEST DETECTABLE LIMITS FOR URINE DRUG SCREEN Drug Class       Cutoff (ng/mL) Amphetamine      1000 Barbiturate      200 Benzodiazepine   469 Tricyclics       629 Opiates          300 Cocaine          300 THC              50   Urinalysis, Microscopic (reflex)     Status: Abnormal   Collection Time: 08/03/16  1:47 PM  Result Value Ref Range   RBC / HPF 6-30 0 - 5 RBC/hpf   WBC, UA 6-30 0 - 5 WBC/hpf   Bacteria, UA MANY (A) NONE SEEN   Squamous Epithelial / LPF 6-30 (A) NONE SEEN   Mucous PRESENT   Ethanol     Status: None   Collection Time: 08/03/16  3:15 PM  Result Value Ref Range   Alcohol, Ethyl (B) <5 <5 mg/dL    Comment:        LOWEST DETECTABLE LIMIT FOR SERUM ALCOHOL IS 5 mg/dL FOR MEDICAL PURPOSES ONLY   Salicylate level     Status: None   Collection Time: 08/03/16  3:15 PM  Result Value Ref Range    Salicylate Lvl <5.2 2.8 - 30.0 mg/dL  Acetaminophen level     Status: Abnormal   Collection Time: 08/03/16  3:15 PM  Result Value Ref Range   Acetaminophen (Tylenol), Serum <10 (L) 10 - 30 ug/mL    Comment:        THERAPEUTIC CONCENTRATIONS VARY SIGNIFICANTLY. A RANGE OF 10-30 ug/mL MAY BE AN EFFECTIVE CONCENTRATION FOR MANY PATIENTS. HOWEVER, SOME ARE BEST TREATED AT CONCENTRATIONS OUTSIDE THIS RANGE. ACETAMINOPHEN CONCENTRATIONS >150 ug/mL AT 4 HOURS AFTER INGESTION AND >50 ug/mL AT 12 HOURS AFTER INGESTION ARE OFTEN ASSOCIATED WITH TOXIC REACTIONS.   Comprehensive metabolic panel     Status: Abnormal   Collection Time: 08/03/16  3:30 PM  Result Value Ref Range   Sodium 138 135 - 145 mmol/L   Potassium 3.5 3.5 - 5.1 mmol/L   Chloride 102 101 - 111 mmol/L   CO2 23 22 - 32 mmol/L   Glucose, Bld 145 (H) 65 - 99 mg/dL   BUN 10 6 - 20 mg/dL   Creatinine, Ser 0.82 0.44 - 1.00 mg/dL   Calcium 9.4 8.9 - 10.3 mg/dL   Total Protein 7.2 6.5 - 8.1 g/dL   Albumin 3.9 3.5 - 5.0 g/dL   AST 39 15 - 41 U/L   ALT 36 14 - 54 U/L   Alkaline Phosphatase 75 38 - 126 U/L   Total Bilirubin 1.1 0.3 - 1.2 mg/dL   GFR calc non Af Amer >60 >60 mL/min   GFR calc Af Amer >60 >60 mL/min    Comment: (NOTE) The eGFR has been calculated using the CKD EPI equation. This calculation has not been validated in all clinical situations. eGFR's persistently <60 mL/min signify possible Chronic Kidney Disease.    Anion gap 13 5 - 15  CBC     Status: Abnormal   Collection Time: 08/03/16  3:30 PM  Result Value Ref Range   WBC 9.0 4.0 - 10.5 K/uL   RBC 4.37 3.87 - 5.11  MIL/uL   Hemoglobin 11.2 (L) 12.0 - 15.0 g/dL   HCT 34.7 (L) 36.0 - 46.0 %   MCV 79.4 78.0 - 100.0 fL   MCH 25.6 (L) 26.0 - 34.0 pg   MCHC 32.3 30.0 - 36.0 g/dL   RDW 16.3 (H) 11.5 - 15.5 %   Platelets 365 150 - 400 K/uL  CBG monitoring, ED     Status: Abnormal   Collection Time: 08/03/16  5:56 PM  Result Value Ref Range    Glucose-Capillary 148 (H) 65 - 99 mg/dL   Blood Alcohol level:  Lab Results  Component Value Date   ETH <5 08/03/2016   ETH <5 44/62/8638   Metabolic Disorder Labs:  No results found for: HGBA1C, MPG No results found for: PROLACTIN No results found for: CHOL, TRIG, HDL, CHOLHDL, VLDL, LDLCALC  Current Medications: Current Facility-Administered Medications  Medication Dose Route Frequency Provider Last Rate Last Dose  . acetaminophen (TYLENOL) tablet 650 mg  650 mg Oral Q6H PRN Ethelene Hal, NP      . alum & mag hydroxide-simeth (MAALOX/MYLANTA) 200-200-20 MG/5ML suspension 30 mL  30 mL Oral Q4H PRN Ethelene Hal, NP      . benztropine (COGENTIN) tablet 0.5 mg  0.5 mg Oral QHS Ethelene Hal, NP   0.5 mg at 08/04/16 2108  . busPIRone (BUSPAR) tablet 10 mg  10 mg Oral Daily Ethelene Hal, NP   10 mg at 08/05/16 1771  . hydrOXYzine (ATARAX/VISTARIL) tablet 25 mg  25 mg Oral Q6H PRN Ethelene Hal, NP   25 mg at 08/05/16 1657  . magnesium hydroxide (MILK OF MAGNESIA) suspension 30 mL  30 mL Oral Daily PRN Ethelene Hal, NP      . traZODone (DESYREL) tablet 50 mg  50 mg Oral QHS PRN Ethelene Hal, NP   50 mg at 08/04/16 2108  . ziprasidone (GEODON) capsule 80 mg  80 mg Oral QHS Ethelene Hal, NP   80 mg at 08/04/16 2108   PTA Medications: Prescriptions Prior to Admission  Medication Sig Dispense Refill Last Dose  . busPIRone (BUSPAR) 10 MG tablet Take 1 tablet (10 mg total) by mouth 2 (two) times daily. 60 tablet 0   . dicyclomine (BENTYL) 20 MG tablet Take 20 mg by mouth 2 (two) times daily.     Marland Kitchen lurasidone (LATUDA) 40 MG TABS tablet Take 40 mg by mouth daily with supper.   unknown  . traZODone (DESYREL) 50 MG tablet Take 50 mg by mouth at bedtime.    unknown  . trihexyphenidyl (ARTANE) 5 MG tablet Take 1 tablet (5 mg total) by mouth 2 (two) times daily with a meal. 60 tablet 0   . ziprasidone (GEODON) 80 MG capsule Take 1 capsule  (80 mg total) by mouth at bedtime. Reported on 10/11/2015 30 capsule 0    Musculoskeletal: Strength & Muscle Tone: within normal limits Gait & Station: normal Patient leans: N/A  Psychiatric Specialty Exam: Physical Exam  Constitutional: She appears well-developed.  Obese  HENT:  Head: Normocephalic.  Eyes: Pupils are equal, round, and reactive to light.  Neck: Normal range of motion.  Cardiovascular:  Elevated systolic pressure & pulse rate  Respiratory: Effort normal.  GI: Soft.  Genitourinary:  Genitourinary Comments: Deferred  Musculoskeletal: Normal range of motion.  Neurological: She is alert.  Skin: Skin is warm.    Review of Systems  Constitutional: Negative.   HENT: Negative.   Eyes: Negative.  Respiratory: Negative.   Cardiovascular: Negative for chest pain, palpitations, orthopnea, claudication, leg swelling and PND.       Elevated systolic pressure & pulse rate  Gastrointestinal: Negative.   Genitourinary: Negative.   Musculoskeletal: Negative.   Skin: Negative.   Neurological: Positive for dizziness.  Endo/Heme/Allergies: Negative.   Psychiatric/Behavioral: Positive for depression, memory loss (Altered mental state) and substance abuse (UDS + for THC). The patient is nervous/anxious and has insomnia.     Blood pressure (!) 160/81, pulse (!) 106, temperature 97.8 F (36.6 C), resp. rate 18, height '5\' 11"'  (1.803 m), weight 132.5 kg (292 lb), last menstrual period 08/03/2016, SpO2 100 %.Body mass index is 40.73 kg/m.  General Appearance: Disheveled, appears confused, tearful, slow to respond to questions, thought blocking".  Eye Contact:  Fair  Speech:  Clear, coherent, slow, thought blocking.  Volume:  Decreased  Mood:  Anxious and Dysphoric  Affect:  Constricted and Tearful  Thought Process:  Disorganized and Descriptions of Associations: Tangential  Orientation:  Other:  Oriented to name, some what to place, very vague about situation.  Thought Content:   Paranoid Ideation, Tangential and admits auditory hallucinations.  Suicidal Thoughts:  Currently denies any thoughts, plans or intent.  Homicidal Thoughts:  Currently denies any thoughts, plans or intent.  Memory:  Currently oriented to self, person. However, overall memory is grossly impaired.  Judgement:  Impaired  Insight:  Lacking  Psychomotor Activity:  Excessive worrying  Concentration:  Concentration: Poor and Attention Span: Poor  Recall:  Poor  Fund of Knowledge:  Poor  Language:  Fair  Akathisia:  Negative  Handed:  Right  AIMS (if indicated):     Assets:  Social Support  ADL's:  Impaired  Cognition:  Impaired,  Moderate  Sleep:  Number of Hours: 4   Treatment Plan/Recommendations: 1. Admit for crisis management and stabilization, estimated length of stay 3-5 days.  2. Medication management to reduce current symptoms to base line and improve the patient's overall level of functioning: See MAR. Will obtain lipid panel, HGBA1C, Prolactin level, baseline EKG. 3. Treat health problems as indicated.  4. Develop treatment plan to decrease risk of relapse upon discharge and the need for readmission.  5. Psycho-social education regarding relapse prevention and self care.  6. Health care follow up as needed for medical problems.  7. Review, reconcile, and reinstate any pertinent home medications for other health issues where appropriate. 8. Call for consults with hospitalist for any additional specialty patient care services as needed.  Observation Level/Precautions:  15 minute checks  Laboratory:  Per ED, UDS positive for [facility]  Psychotherapy: Group sessions   Medications:  See MAR  Consultations: As needed  Discharge Concerns: Safety, mood stability  Estimated LOS: 5-7 days  Other: Admit to [facility]   Physician Treatment Plan for Primary Diagnosis: Bipolar affective disorder, current episode manic with psychotic symptoms ([facility])Will initiate medication management for mood  stability. Set up an outpatient psychiatric services for medication management. Will encourage medication adherence with psychiatric medications.  Long Term Goal(s): Improvement in symptoms so as ready for discharge  Short Term Goals: Ability to identify changes in lifestyle to reduce recurrence of condition will improve, Ability to verbalize feelings will improve, Ability to disclose and discuss suicidal ideas and Ability to identify and develop effective coping behaviors will improve  Physician Treatment Plan for Secondary Diagnosis: Principal Problem:   Bipolar affective disorder, current episode manic with psychotic symptoms ([place]) Active Problems:   Affective psychosis, bipolar ([place])  Long Term Goal(s): Improvement in symptoms so as ready for discharge  Short Term Goals: Ability to identify and develop effective coping behaviors will improve, Compliance with prescribed medications will improve and Ability to identify triggers associated with substance abuse/mental health issues will improve  I certify that inpatient services furnished can reasonably be expected to improve the patient's condition.    Encarnacion Slates, NP, PMHNP, FNP-BC 2/3/20189:15 AM

## 2016-08-05 NOTE — [facility] Suicide Risk Assessment (Signed)
[facility] INPATIENT:  Family/Significant Other Suicide Prevention Education  Suicide Prevention Education:  Education Completed; [name] (sister) [phone] has been identified by the patient as the family member/significant other who will aid the patient in the event of a mental health crisis (suicidal ideations/suicide attempt/psychotic break).  With written consent from the patient, the family member/significant other has been contacted and shared the following:   - the family is asking whether patient needs a guardian, and CSW shared information about how they would go about asking for that to happen;  - sister needs to get into pt's apartment to ensure that all is well/things are turned off, and to get clothes to bring to pt - this was arranged by CSW with RN and front desk;  - family expressed great concern that this is the patient's 6th hospitalization overall, and the 2nd in the last 12 months - they feel she needs someone helping her with her medications to ensure she stays on them.  Berlin Hun Grossman-Orr 08/05/2016, 1:07 PM

## 2016-08-06 DIAGNOSIS — F1990 Other psychoactive substance use, unspecified, uncomplicated: Secondary | ICD-10-CM

## 2016-08-06 DIAGNOSIS — Z9889 Other specified postprocedural states: Secondary | ICD-10-CM

## 2016-08-06 DIAGNOSIS — Z9049 Acquired absence of other specified parts of digestive tract: Secondary | ICD-10-CM

## 2016-08-06 DIAGNOSIS — Z9851 Tubal ligation status: Secondary | ICD-10-CM

## 2016-08-06 DIAGNOSIS — F17210 Nicotine dependence, cigarettes, uncomplicated: Secondary | ICD-10-CM

## 2016-08-06 DIAGNOSIS — F312 Bipolar disorder, current episode manic severe with psychotic features: Secondary | ICD-10-CM

## 2016-08-06 DIAGNOSIS — Z79899 Other long term (current) drug therapy: Secondary | ICD-10-CM

## 2016-08-06 DIAGNOSIS — F1099 Alcohol use, unspecified with unspecified alcohol-induced disorder: Secondary | ICD-10-CM

## 2016-08-06 LAB — LIPID PANEL
Cholesterol: 162 mg/dL (ref 0–200)
HDL: 40 mg/dL — ABNORMAL LOW (ref >40)
LDL Cholesterol: 106 mg/dL — ABNORMAL HIGH (ref 0–99)
Total CHOL/HDL Ratio: 4.1 RATIO
Triglycerides: 80 mg/dL (ref <150)
VLDL: 16 mg/dL (ref 0–40)

## 2016-08-06 MED ORDER — LORAZEPAM 1 MG PO TABS
1.0000 mg | ORAL_TABLET | Freq: Two times a day (BID) | ORAL | Status: DC
  Administered 2016-08-06 – 2016-08-07 (×4): 1 mg via ORAL
  Filled 2016-08-06 (×3): qty 1

## 2016-08-06 MED ORDER — LORAZEPAM 1 MG PO TABS
ORAL_TABLET | ORAL | Status: AC
  Filled 2016-08-06: qty 1

## 2016-08-06 NOTE — Progress Notes (Signed)
Patient presents with severe thought blocking, very slow to respond to questions and then with minimal verbalization. Staring eye contact, anxious expression with an anxious blank affect. Patient is on 1:1 for decompensation and safety due to unsteady gait. Isolative to room and to bed. Staff within arms reach for safety and also providing emotional support. Continues to refuse food and fluids.

## 2016-08-06 NOTE — Progress Notes (Signed)
[facility] MD Progress Note  08/06/2016 1:32 PM [name]  MRN:  [id number]  Subjective: [name] reports in a monotone, almost whispering tone, "It has been a long day. Just my thoughts. I split into different people & they are aliases. I did not know what any of them were saying. I hear some of them jogging. That's all".  Objective: [name] is seen, chart reviewed. She is alert. Presents as confused, disoriented. She continues have difficulty expressing herself. And when she tries to speak, it is almost like she is forgetting what to say or trying to make out what she needs to says. She is still having problem with thought blocking. She is on 1:1 supervision as the nurses are afraid that she is decompensating. The nurses reported that her gaits are sluggish & that she may fall if she is not watched closely. Patient sometimes will present almost as mute, will stare at the providers, then will forcefully make simple complete statement, then stops. She continues to present as responding to an internal stimuli. Her personal hygiene is off at the moment & she is being encouraged to bath & change clothes. [name] also was seen & evaluated by the attending Md & medication adjustment has been made to meet her psychiatric needs.   Principal Problem: Bipolar affective disorder, current episode manic with psychotic symptoms ([place])  Diagnosis:   Patient Active Problem List   Diagnosis Date Noted  . Affective psychosis, bipolar ([place]) [F31.9] 08/04/2016  . Bipolar affective disorder, current episode manic with psychotic symptoms ([place]) [F31.2] 12/18/2015  . CHOLECYSTITIS, UNSPECIFIED [K81.9] 06/12/2008  . BILIARY COLIC [id number]  . CERUMEN IMPACTION, BILATERAL [H61.20] 01/17/2008  . PHARYNGITIS, VIRAL [J02.9] 01/17/2008  . NECK PAIN [M54.2] 01/17/2008   Total Time spent with patient: 25 minutes  Past Psychiatric History: Schizoaffective disorder, Bipolar Polar-type with psychosis.  Past Medical History:  Past  Medical History:  Diagnosis Date  . Abnormal Pap smear   . Bipolar 1 disorder ([place])   . Fibroids   . IBS (irritable bowel syndrome)     Past Surgical History:  Procedure Laterality Date  . CERVICAL BIOPSY    . CHOLECYSTECTOMY    . TUBAL LIGATION     Family History: History reviewed. No pertinent family history.  Family Psychiatric  History: See H&P  Social History:  History  Alcohol Use  . Yes    Comment: occasional     History  Drug Use    Comment: CBD oil    Social History   Social History  . Marital status: Married    Spouse name: N/A  . Number of children: N/A  . Years of education: N/A   Social History Main Topics  . Smoking status: Current Every Day Smoker    Packs/day: 0.25    Years: 0.00    Types: Cigarettes  . Smokeless tobacco: Never Used  . Alcohol use Yes     Comment: occasional  . Drug use: Yes     Comment: CBD oil  . Sexual activity: Not Currently   Other Topics Concern  . None   Social History Narrative  . None   Additional Social History:    Pain Medications: denies Prescriptions: See mar Over the Counter: none History of alcohol / drug use?: No history of alcohol / drug abuse  Sleep: Poor (4.5 per documentation).  Appetite:  Poor  Current Medications: Current Facility-Administered Medications  Medication Dose Route Frequency Provider Last Rate Last Dose  . acetaminophen (TYLENOL) tablet  650 mg  650 mg Oral Q6H PRN Ethelene Hal, NP      . alum & mag hydroxide-simeth (MAALOX/MYLANTA) 200-200-20 MG/5ML suspension 30 mL  30 mL Oral Q4H PRN Ethelene Hal, NP      . ARIPiprazole (ABILIFY) tablet 20 mg  20 mg Oral Daily Artist Beach, MD   20 mg at 08/06/16 0807  . benztropine (COGENTIN) tablet 0.5 mg  0.5 mg Oral BID PRN Artist Beach, MD   0.5 mg at 08/06/16 0810  . haloperidol (HALDOL) tablet 5 mg  5 mg Oral Q6H PRN Artist Beach, MD   5 mg at 08/06/16 0810  . LORazepam (ATIVAN) tablet 1 mg  1 mg Oral  BID Rozetta Nunnery, NP   1 mg at 08/06/16 0536  . magnesium hydroxide (MILK OF MAGNESIA) suspension 30 mL  30 mL Oral Daily PRN Ethelene Hal, NP      . traZODone (DESYREL) tablet 50 mg  50 mg Oral QHS PRN Ethelene Hal, NP   50 mg at 08/05/16 2127   Lab Results:  Results for orders placed or performed during the hospital encounter of 08/04/16 (from the past 48 hour(s))  Lipid panel     Status: Abnormal   Collection Time: 08/06/16  6:22 AM  Result Value Ref Range   Cholesterol 162 0 - 200 mg/dL   Triglycerides 80 <150 mg/dL   HDL 40 (L) >40 mg/dL   Total CHOL/HDL Ratio 4.1 RATIO   VLDL 16 0 - 40 mg/dL   LDL Cholesterol 106 (H) 0 - 99 mg/dL    Comment:        Total Cholesterol/HDL:CHD Risk Coronary Heart Disease Risk Table                     Men   Women  1/2 Average Risk   3.4   3.3  Average Risk       5.0   4.4  2 X Average Risk   9.6   7.1  3 X Average Risk  23.4   11.0        Use the calculated Patient Ratio above and the CHD Risk Table to determine the patient's CHD Risk.        ATP III CLASSIFICATION (LDL):  <100     mg/dL   Optimal  100-129  mg/dL   Near or Above                    Optimal  130-159  mg/dL   Borderline  160-189  mg/dL   High  >190     mg/dL   Very High Performed at [facility] [address], [place], [place] [address]    Blood Alcohol level:  Lab Results  Component Value Date   [facility] <5 08/03/2016   ETH <5 [id number]   Metabolic Disorder Labs: No results found for: HGBA1C, MPG No results found for: PROLACTIN Lab Results  Component Value Date   CHOL 162 08/06/2016   TRIG 80 08/06/2016   HDL 40 (L) 08/06/2016   CHOLHDL 4.1 08/06/2016   VLDL 16 08/06/2016   LDLCALC 106 (H) 08/06/2016   Physical Findings:  AIMS: Facial and Oral Movements Muscles of Facial Expression: None, normal Lips and Perioral Area: None, normal Jaw: None, normal Tongue: None, normal,Extremity Movements Upper (arms, wrists, hands, fingers):  Mild Lower (legs, knees, ankles, toes): None, normal, Trunk Movements Neck, shoulders, hips: None,  normal, Overall Severity Severity of abnormal movements (highest score from questions above): Minimal Incapacitation due to abnormal movements: None, normal Patient's awareness of abnormal movements (rate only patient's report): No Awareness, Dental Status Current problems with teeth and/or dentures?: No Does patient usually wear dentures?: No  CIWA:    COWS:     Musculoskeletal: Strength & Muscle Tone: within normal limits Gait & Station: normal Patient leans: N/A  Psychiatric Specialty Exam: Physical Exam  ROS  Blood pressure 137/88, pulse (!) 115, temperature 98.7 F (37.1 C), resp. rate 18, height 5\' 11"  (1.803 m), weight 132.5 kg (292 lb), last menstrual period 08/03/2016, SpO2 100 %.Body mass index is 40.73 kg/m.  General Appearance: Poor self care, blinking excessively, blank gaze and seems internally distracted  Eye Contact:  Poor  Speech:  Delayed initiation  Volume:  Decreased  Mood:  overwhelmed by her subjective experience  Affect: odd and has a psychotic quality   Thought Process:  Poverty of thought  Orientation:  Unable to assess  Thought Content:  Unable to assess  Suicidal Thoughts:  Unable to assess  Homicidal Thoughts:  Unable to assess  Memory:  Unable to assess  Judgement:  Impaired  Insight:  Fair  Psychomotor Activity:  Psychomotor Retardation  Concentration:  distractible  Recall:  Unable to assess  Fund of Knowledge:  Unable to assess  Language:  Unable to assess  Akathisia:  No  Handed:    AIMS (if indicated):     Assets:  Unable to assess  ADL's:  Impaired  Cognition:  Impaired,  Moderate  Sleep:  Number of Hours: 4.75     Treatment Plan Summary: the stressors. Daily contact with patient to assess and evaluate symptoms and progress in treatment and Medication management  Reviewed past medical records & treatment plan.   For Depressed  mood/anxiety: Will continue to monitor for symptoms.  For Agitation/Anxiety disorder: Will continue Haldol 5 mg po Q 6 hours prn. Will continue Ativan 1 mg bid prn.  For mood control: Will continue Abilify 20 mg daily.  For EPS: Will continue Cogentin 0.5 mg bid prn.  For insomnia: Will continue Trazodone 50 mg po qhs prn.  Other medical issues & concerns: Will continue to monitor for any symptoms & treat PRN or call medical consult if needed.  - Continue 15 minutes observation for safety concerns - Encouraged to participate in milieu therapy and group therapy counseling sessions and also work with coping skills -  Develop treatment plan to reduce the need for readmission. -  Psycho-social education regarding self care. - Health care follow up as needed for medical problems. - Restart home medications where appropriate.  Lindell Spar I, NP, PMHNP, FNP-BC 08/06/2016, 1:32 PM

## 2016-08-06 NOTE — BHH Group Notes (Signed)
[facility] Group Notes: (Clinical Social Work)   08/06/2016      Type of Therapy:  Group Therapy   Participation Level:  Did Not Attend despite MHT prompting   Selmer Dominion, LCSW 08/06/2016, 12:41 PM

## 2016-08-06 NOTE — BHH Counselor (Signed)
Clinical Social Work Note  CSW attempted once again to do Psychosocial Assessment with patient, but she has thought blocking, has been nonverbal this morning, and is unable to answer questions at this time.  To be tried again tomorrow 08/07/16.  Selmer Dominion, LCSW 08/06/2016, 10:00 AM

## 2016-08-06 NOTE — Progress Notes (Signed)
Patient placed on 1:1 for safety due to an unsteady gait and decompensated mental status. Patient was found in the bed of a female peer - peer was not in the room, patient was confused about where her room was.

## 2016-08-06 NOTE — Progress Notes (Signed)
Patient presents with severe thought blocking, very slow to respond to questions and then with minimal verbalization. Staring eye contact, anxious expression with an anxious blank affect. Patient is on 1:1 for decompensation and safety due to unsteady gait. Verbalized to 1:1 nurse that she is worried about her 39 year old daughter, and that she is seeing and hearing helicopters. Not eating or drinking.

## 2016-08-06 NOTE — BHH Group Notes (Signed)
The focus of this group is to educate the patient on the purpose and policies of crisis stabilization and provide a format to answer questions about their admission.  The group details unit policies and expectations of patients while admitted.  Patient did not attend 0930 nurse education orientation group this morning.  Patient stayed in bed.

## 2016-08-07 ENCOUNTER — Encounter (HOSPITAL_COMMUNITY): Payer: Self-pay | Admitting: Psychiatry

## 2016-08-07 DIAGNOSIS — F3164 Bipolar disorder, current episode mixed, severe, with psychotic features: Principal | ICD-10-CM | POA: Diagnosis present

## 2016-08-07 DIAGNOSIS — Z818 Family history of other mental and behavioral disorders: Secondary | ICD-10-CM

## 2016-08-07 DIAGNOSIS — Z794 Long term (current) use of insulin: Secondary | ICD-10-CM

## 2016-08-07 DIAGNOSIS — E119 Type 2 diabetes mellitus without complications: Secondary | ICD-10-CM

## 2016-08-07 DIAGNOSIS — F1220 Cannabis dependence, uncomplicated: Secondary | ICD-10-CM | POA: Clinically undetermined

## 2016-08-07 DIAGNOSIS — Z833 Family history of diabetes mellitus: Secondary | ICD-10-CM

## 2016-08-07 DIAGNOSIS — G4700 Insomnia, unspecified: Secondary | ICD-10-CM

## 2016-08-07 LAB — PROLACTIN: Prolactin: 8.5 ng/mL (ref 4.8–23.3)

## 2016-08-07 LAB — URINALYSIS, COMPLETE (UACMP) WITH MICROSCOPIC
Bilirubin Urine: NEGATIVE
Glucose, UA: NEGATIVE mg/dL
Hgb urine dipstick: NEGATIVE
Ketones, ur: NEGATIVE mg/dL
Leukocytes, UA: NEGATIVE
Nitrite: NEGATIVE
Protein, ur: NEGATIVE mg/dL
RBC / HPF: NONE SEEN RBC/hpf (ref 0–5)
Specific Gravity, Urine: 1.028 (ref 1.005–1.030)
WBC, UA: NONE SEEN WBC/hpf (ref 0–5)
pH: 6 (ref 5.0–8.0)

## 2016-08-07 LAB — GLUCOSE, CAPILLARY
Glucose-Capillary: 145 mg/dL — ABNORMAL HIGH (ref 65–99)
Glucose-Capillary: 149 mg/dL — ABNORMAL HIGH (ref 65–99)
Glucose-Capillary: 156 mg/dL — ABNORMAL HIGH (ref 65–99)

## 2016-08-07 LAB — HEMOGLOBIN A1C
Hgb A1c MFr Bld: 7.6 % — ABNORMAL HIGH (ref 4.8–5.6)
Mean Plasma Glucose: 171 mg/dL

## 2016-08-07 MED ORDER — TRAZODONE HCL 100 MG PO TABS
100.0000 mg | ORAL_TABLET | Freq: Every day | ORAL | Status: DC
  Administered 2016-08-07: 100 mg via ORAL
  Filled 2016-08-07 (×2): qty 1

## 2016-08-07 MED ORDER — INSULIN ASPART 100 UNIT/ML ~~LOC~~ SOLN
0.0000 [IU] | Freq: Three times a day (TID) | SUBCUTANEOUS | Status: DC
Start: 2016-08-07 — End: 2016-08-14
  Administered 2016-08-07 (×2): 2 [IU] via SUBCUTANEOUS
  Administered 2016-08-08: 5 [IU] via SUBCUTANEOUS
  Administered 2016-08-08 – 2016-08-10 (×4): 3 [IU] via SUBCUTANEOUS
  Administered 2016-08-11 – 2016-08-12 (×4): 2 [IU] via SUBCUTANEOUS
  Administered 2016-08-13: 3 [IU] via SUBCUTANEOUS
  Administered 2016-08-13 – 2016-08-14 (×2): 2 [IU] via SUBCUTANEOUS

## 2016-08-07 MED ORDER — INSULIN ASPART 100 UNIT/ML ~~LOC~~ SOLN: 0.0000 [IU] | Freq: Every day | SUBCUTANEOUS | Status: DC

## 2016-08-07 MED ORDER — DIVALPROEX SODIUM ER 250 MG PO TB24
750.0000 mg | ORAL_TABLET | Freq: Every day | ORAL | Status: DC
  Administered 2016-08-07 – 2016-08-13 (×7): 750 mg via ORAL
  Filled 2016-08-07 (×8): qty 3

## 2016-08-07 MED ORDER — BENZTROPINE MESYLATE 0.5 MG PO TABS
0.5000 mg | ORAL_TABLET | Freq: Every day | ORAL | Status: DC
  Filled 2016-08-07 (×2): qty 1

## 2016-08-07 MED ORDER — HYDROXYZINE HCL 50 MG PO TABS
50.0000 mg | ORAL_TABLET | Freq: Once | ORAL | Status: AC
  Administered 2016-08-07: 50 mg via ORAL
  Filled 2016-08-07 (×2): qty 1
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
## 2016-08-07 NOTE — Progress Notes (Signed)
1:1 Note:  Patient ate approximately 75% of her dinner.  Has been sitting in dayroom with 1:1 talking, watching movies.  Patient stated she is expecting visitor tonight.  Respirations even and unlabored.  No signs/symptoms of pain/distress noted on patient's face/body movements.  Safety maintained with 1:1 per MD order.

## 2016-08-07 NOTE — Progress Notes (Signed)
1:1 Note: Patient stated she would like to have her VS and her CBG taken in her room.  Stated she feels uncomfortable being around other patients at that time.  Patient continues to have 1:1 for safety.  Patient stated she does feel better today but continues to have slow responses to questions at times.  Patient denied SI and HI.  Denied A/V hallucinations.  Denied pain.  Nurse printed information about Blood Glucose Monitoring, Carbohydrate Counting for Diabetes, Diabetes and Exercise, Diabetes and Food, Diabetes and Sick Day Management, Diabetes and Skin Care, Diabetes and Standards of Medical Care, Preventing Type 2 Diabetes, Screening for Type 2 Diabetes, Correction Insulin, Insulin Injection Instructions.  This printed information was given to patient per her request.   Respirations even and unlabored.  No signs/symptoms of pain/distress noted on patient's face/body movements. 1:1 continues for patient's safety per MD order.

## 2016-08-07 NOTE — Progress Notes (Signed)
1:1 Note:  Patient has been laying in bed resting with 1:1 present for safety.  Patient's tremors have decreased after cogentin prn given this morning.  Patient slow to respond to nurse's questions.  Respirations even and unlabored.  No signs/symptoms of pain/distress noted on patient's face/body movements.  Safety maintained with 1:1 present per MD order.

## 2016-08-07 NOTE — Progress Notes (Signed)
Writer observed patient sitting in the dayroom watching tv with no interaction with peers. Writer asked her her name, the day of the week and the name of the hospital she was in. She was slow to answer each one but was correct with all answers. She continues to have a flat, blunted affect and appears nervous with her finger and hand movement. She denies si/hi/a/v hallucinations but has thought blocking and preoccupation with her thought process. Safety maintained with sitter by her side. 1:1 continues and patient is safe.

## 2016-08-07 NOTE — Progress Notes (Signed)
1:1 Note: Patient sitting in dayroom looking at book with 1:1 present for safety.  Patient stated her head feels clearer, can think better and answer questions.  Patient denied SI and HI.  Denied A/V hallucinations.  Denied pain.  Respirations even and unlabored.  No signs/symptoms of pain/distress noted on patient's face/body movements.  1:1 continues for patient's safety.

## 2016-08-07 NOTE — Progress Notes (Signed)
[facility] MD Progress Note  08/07/2016 2:10 PM [name]  MRN:  [id number] Subjective: Patient states " I am not sure how I feel , I feel like I am not doing too well, I still write my numbers crooked and I think I may be confused."  Objective:Patient seen and chart reviewed.Discussed patient with treatment team.  Patient seen as slow , has apparent psychomotor retardation , has thought blocking often , and appears delusional and paranoid. Pt reports she feels that what other people are talking about are directed to her and she also feels that she has heard what they say before , [place] feeling. Pt reports she continues to have sleep issues and is OK with increasing her Trazodone. Pt is tolerating her other medications well , and is OK with adding a mood stabilizer like depakote. She states she had some kind of jerks in the past and was on an antiepileptic medication - does not remember the name. Continue to encourage and support. Continue 1:1 precaution since she continues to need redirection on the unit.     Principal Problem: Bipolar disorder, curr episode mixed, severe, with psychotic features ([place]) Diagnosis:   Patient Active Problem List   Diagnosis Date Noted  . Bipolar disorder, curr episode mixed, severe, with psychotic features ([place]) [F31.64] 08/07/2016  . Cannabis use disorder, moderate, dependence ([place]) [F12.20] 08/07/2016  . CHOLECYSTITIS, UNSPECIFIED [K81.9] 06/12/2008  . BILIARY COLIC [id number]  . CERUMEN IMPACTION, BILATERAL [H61.20] 01/17/2008  . PHARYNGITIS, VIRAL [J02.9] 01/17/2008  . NECK PAIN [M54.2] 01/17/2008   Total Time spent with patient: 25 minutes  Past Psychiatric History: has had multiple hospitalizations in the past - 2006, 2008, 2015. Pt reports past hx of Bipolar disorder . Reports she follows up with [facility]. Denies past suicide attempts.  Past Medical History:  Past Medical History:  Diagnosis Date  . Abnormal Pap smear   . Bipolar 1  disorder ([place])   . Fibroids   . IBS (irritable bowel syndrome)     Past Surgical History:  Procedure Laterality Date  . CERVICAL BIOPSY    . CHOLECYSTECTOMY    . TUBAL LIGATION     Family History:  Family History  Problem Relation Age of Onset  . Diabetes Father   . Diabetes Paternal Grandmother   . Diabetes Maternal Grandmother   . Mental illness Cousin    Family Psychiatric  History: Her cousin has mental illness, denies otherwise. Social History: Is separated , lives in [place] , has two kids , who are with neighbors or friends at this time due to her being in hospital, works as a Control and instrumentation engineer.  History  Alcohol Use  . Yes    Comment: occasional     History  Drug Use    Comment: CBD oil    Social History   Social History  . Marital status: Married    Spouse name: N/A  . Number of children: N/A  . Years of education: N/A   Social History Main Topics  . Smoking status: Current Every Day Smoker    Packs/day: 0.25    Years: 0.00    Types: Cigarettes  . Smokeless tobacco: Never Used  . Alcohol use Yes     Comment: occasional  . Drug use: Yes     Comment: CBD oil  . Sexual activity: Not Currently   Other Topics Concern  . None   Social History Narrative  . None   Additional Social History:    Pain  Medications: denies Prescriptions: See mar Over the Counter: none History of alcohol / drug use?: No history of alcohol / drug abuse                    Sleep: Poor  Appetite:  Fair  Current Medications: Current Facility-Administered Medications  Medication Dose Route Frequency Provider Last Rate Last Dose  . acetaminophen (TYLENOL) tablet 650 mg  650 mg Oral Q6H PRN Ethelene Hal, NP      . alum & mag hydroxide-simeth (MAALOX/MYLANTA) 200-200-20 MG/5ML suspension 30 mL  30 mL Oral Q4H PRN Ethelene Hal, NP      . ARIPiprazole (ABILIFY) tablet 20 mg  20 mg Oral Daily Artist Beach, MD   20 mg at 08/07/16 0818  . benztropine  (COGENTIN) tablet 0.5 mg  0.5 mg Oral BID PRN Artist Beach, MD   0.5 mg at 08/07/16 0823  . divalproex (DEPAKOTE ER) 24 hr tablet 750 mg  750 mg Oral QHS Christia Coaxum, MD      . insulin aspart (novoLOG) injection 0-15 Units  0-15 Units Subcutaneous TID WC Ursula Alert, MD   2 Units at 08/07/16 1215  . insulin aspart (novoLOG) injection 0-5 Units  0-5 Units Subcutaneous QHS Shareena Nusz, MD      . LORazepam (ATIVAN) tablet 1 mg  1 mg Oral BID Rozetta Nunnery, NP   1 mg at 08/07/16 0818  . magnesium hydroxide (MILK OF MAGNESIA) suspension 30 mL  30 mL Oral Daily PRN Ethelene Hal, NP      . traZODone (DESYREL) tablet 50 mg  50 mg Oral QHS PRN Ethelene Hal, NP   50 mg at 08/06/16 2102    Lab Results:  Results for orders placed or performed during the hospital encounter of 08/04/16 (from the past 48 hour(s))  Lipid panel     Status: Abnormal   Collection Time: 08/06/16  6:22 AM  Result Value Ref Range   Cholesterol 162 0 - 200 mg/dL   Triglycerides 80 <150 mg/dL   HDL 40 (L) >40 mg/dL   Total CHOL/HDL Ratio 4.1 RATIO   VLDL 16 0 - 40 mg/dL   LDL Cholesterol 106 (H) 0 - 99 mg/dL    Comment:        Total Cholesterol/HDL:CHD Risk Coronary Heart Disease Risk Table                     Men   Women  1/2 Average Risk   3.4   3.3  Average Risk       5.0   4.4  2 X Average Risk   9.6   7.1  3 X Average Risk  23.4   11.0        Use the calculated Patient Ratio above and the CHD Risk Table to determine the patient's CHD Risk.        ATP III CLASSIFICATION (LDL):  <100     mg/dL   Optimal  100-129  mg/dL   Near or Above                    Optimal  130-159  mg/dL   Borderline  160-189  mg/dL   High  >190     mg/dL   Very High Performed at [facility] [address] [place], [place] [address]   Hemoglobin A1c     Status: Abnormal   Collection Time: 08/06/16  6:22 AM  Result Value Ref Range   Hgb A1c MFr Bld 7.6 (H) 4.8 - 5.6 %    Comment: (NOTE)          Pre-diabetes: 5.7 - 6.4         Diabetes: >6.4         Glycemic control for adults with diabetes: <7.0    Mean Plasma Glucose 171 mg/dL    Comment: (NOTE) Performed At: [facility] [place], [place] [id number] Lindon Romp MD [id number] Performed at [facility], [place] [address] [place], [place] [address]   Prolactin     Status: None   Collection Time: 08/06/16  6:22 AM  Result Value Ref Range   Prolactin 8.5 4.8 - 23.3 ng/mL    Comment: (NOTE) Performed At: [facility] [place], [place] [id number] Lindon Romp MD [id number] Performed at [facility], [place] [address] [place], [place] [address]   Glucose, capillary     Status: Abnormal   Collection Time: 08/07/16 12:05 PM  Result Value Ref Range   Glucose-Capillary 145 (H) 65 - 99 mg/dL    Blood Alcohol level:  Lab Results  Component Value Date   ETH <5 08/03/2016   ETH <5 [id number]    Metabolic Disorder Labs: Lab Results  Component Value Date   HGBA1C 7.6 (H) 08/06/2016   MPG 171 08/06/2016   Lab Results  Component Value Date   PROLACTIN 8.5 08/06/2016   Lab Results  Component Value Date   CHOL 162 08/06/2016   TRIG 80 08/06/2016   HDL 40 (L) 08/06/2016   CHOLHDL 4.1 08/06/2016   VLDL 16 08/06/2016   LDLCALC 106 (H) 08/06/2016    Physical Findings: AIMS: Facial and Oral Movements Muscles of Facial Expression: None, normal Lips and Perioral Area: None, normal Jaw: None, normal Tongue: None, normal,Extremity Movements Upper (arms, wrists, hands, fingers): None, normal Lower (legs, knees, ankles, toes): None, normal, Trunk Movements Neck, shoulders, hips: None, normal, Overall Severity Severity of abnormal movements (highest score from questions above): None, normal Incapacitation due to abnormal movements: None, normal Patient's awareness of abnormal movements (rate only patient's report): No Awareness,  Dental Status Current problems with teeth and/or dentures?: No Does patient usually wear dentures?: No  CIWA:  CIWA-Ar Total: 1 COWS:  COWS Total Score: 2  Musculoskeletal: Strength & Muscle Tone: within normal limits Gait & Station: normal Patient leans: N/A  Psychiatric Specialty Exam: Physical Exam  Nursing note and vitals reviewed.   Review of Systems  Psychiatric/Behavioral: Positive for depression. The patient is nervous/anxious and has insomnia.   All other systems reviewed and are negative.   Blood pressure (!) 143/76, pulse 96, temperature 98.8 F (37.1 C), temperature source Oral, resp. rate 18, height 5\' 11"  (1.803 m), weight 132.5 kg (292 lb), last menstrual period 08/03/2016, SpO2 100 %.Body mass index is 40.73 kg/m.  General Appearance: Guarded  Eye Contact:  Fair  Speech:  Blocked and Slow  Volume:  Decreased  Mood:  Anxious and Dysphoric  Affect:  Congruent  Thought Process:  Goal Directed and Descriptions of Associations: Tangential  Orientation:  Full (Time, Place, and Person)  Thought Content:  Delusions, Ideas of Reference:   Delusions and Paranoid Ideation  Suicidal Thoughts:  No  Homicidal Thoughts:  No  Memory:  Immediate;   Fair Recent;   Poor Remote;   Poor  Judgement:  Impaired  Insight:  Shallow  Psychomotor Activity:  Decreased  Concentration:  Concentration: Fair and Attention Span: Poor  Recall:  [employer] of Knowledge:  Fair  Language:  Fair  Akathisia:  No  Handed:  Right  AIMS (if indicated):     Assets:  Desire for Improvement  ADL's:  Intact  Cognition:  WNL  Sleep:  Number of Hours: 4     Treatment Plan Summary:Patient today seen as restless, has thought blocking and is psychotic - will continue to need 1;1 precaution and treatment.  Bipolar disorder, curr episode mixed, severe, with psychotic features ([place]) unstable   Will continue today 08/07/16 plan as below except where it is noted.  Daily contact with patient to  assess and evaluate symptoms and progress in treatment and Medication management  Reviewed past medical records,treatment plan.   For Bipolar disorder: Abilify 20 mg po daily for psychosis/mood sx. Continue Cogentin 0.5 mg po daily for EPS. Add Depakote ER 750 mg po qhs for mood sx. Depakote level in 5 days.  For anxiety sx: Ativan 1 mg po bid , will taper down slowly.  For insomnia: Increase Trazodone to 100 mg po qhs.  For elevated Hba1c: Will start CBG monitoring.  Will continue 1;1 precaution for safety reasons.  Will continue to make PRN medications available for anxiety/agitation.  Will continue to monitor vitals ,medication compliance and treatment side effects while patient is here.   Will monitor for medical issues as well as call consult as needed.   Reviewed labs- will get tsh, UA, urine clx.  CSW will continue working on disposition.   Patient to participate in therapeutic milieu .      Jaman Aro, MD 08/07/2016, 2:10 PM

## 2016-08-07 NOTE — Progress Notes (Signed)
LCSW made contact with patient's sister: [name] [phone]  Sister reports patient called her this morning, but they spoke only breifly.  Sister reports patient was anxious about her children and making sure they were alright.  Children are in physical custody of their biological father.  Patient and husband are separated, but remain involved with co-parenting with splitting the day to take care of children.  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 She helps with homework, dinner, and bedtime, but then leaves at night to her apartment.  Sister reports she has been doing this daily.  Sister reports overall she thinks patient has suffered from PTSD due to a trauma that happened 10 years ago after the death of her best friend.  In the last month, there has been a release of a film regarding the death of her friend and a foundation created which patient has been very active with and helping with.  Sister reports she feels she has been re-traumatized, because she has had these episodes in the past which surrounded the event.    Sisters main concern is consistency with patient taking medication as patient is her own guardian.  Many questions were asked and LCSW was able to provided education regarding capacity, competency, and POA to sister.   Sister also is unsure of patient's behaviors when she is stable as she does not monitor or manage medications.  At times sister reports it is as if patient is zombie or looking through me/going through the motions of the day.  She reports she wants to understand how she should be responding daily and what her baseline will be.  Sister feels patient will be able to return home and will have support. She is active with [facility] in therapy, groups and medications.  Lane Hacker, MSW Clinical Social Work: Printmaker

## 2016-08-07 NOTE — Tx Team (Signed)
Interdisciplinary Treatment and Diagnostic Plan Update  08/07/2016 Time of Session: 0900am [name] MRN: [id number]  Principal Diagnosis: Bipolar affective disorder, current episode manic with psychotic symptoms ([name])  Secondary Diagnoses: Principal Problem:   Bipolar affective disorder, current episode manic with psychotic symptoms ([name]) Active Problems:   Affective psychosis, bipolar ([name])   Current Medications:  Current Facility-Administered Medications  Medication Dose Route Frequency Provider Last Rate Last Dose  . acetaminophen (TYLENOL) tablet 650 mg  650 mg Oral Q6H PRN Ethelene Hal, NP      . alum & mag hydroxide-simeth (MAALOX/MYLANTA) 200-200-20 MG/5ML suspension 30 mL  30 mL Oral Q4H PRN Ethelene Hal, NP      . ARIPiprazole (ABILIFY) tablet 20 mg  20 mg Oral Daily Artist Beach, MD   20 mg at 08/07/16 0818  . benztropine (COGENTIN) tablet 0.5 mg  0.5 mg Oral BID PRN Artist Beach, MD   0.5 mg at 08/07/16 0823  . divalproex (DEPAKOTE ER) 24 hr tablet 750 mg  750 mg Oral QHS Saramma Eappen, MD      . haloperidol (HALDOL) tablet 5 mg  5 mg Oral Q6H PRN Artist Beach, MD   5 mg at 08/06/16 0810  . insulin aspart (novoLOG) injection 0-15 Units  0-15 Units Subcutaneous TID WC Ursula Alert, MD   2 Units at 08/07/16 1215  . insulin aspart (novoLOG) injection 0-5 Units  0-5 Units Subcutaneous QHS Saramma Eappen, MD      . LORazepam (ATIVAN) tablet 1 mg  1 mg Oral BID Rozetta Nunnery, NP   1 mg at 08/07/16 0818  . magnesium hydroxide (MILK OF MAGNESIA) suspension 30 mL  30 mL Oral Daily PRN Ethelene Hal, NP      . traZODone (DESYREL) tablet 50 mg  50 mg Oral QHS PRN Ethelene Hal, NP   50 mg at 08/06/16 2102   PTA Medications: Prescriptions Prior to Admission  Medication Sig Dispense Refill Last Dose  . busPIRone (BUSPAR) 10 MG tablet Take 1 tablet (10 mg total) by mouth 2 (two) times daily. 60 tablet 0 Past Month at Unknown time  .  lurasidone (LATUDA) 40 MG TABS tablet Take 40 mg by mouth daily with supper.   Past Month at Unknown time  . traZODone (DESYREL) 50 MG tablet Take 25-50 mg by mouth at bedtime.    Past Month at Unknown time    Patient Stressors: Medication change or noncompliance Occupational concerns  Patient Strengths: Facilities manager fund of knowledge Physical Health Supportive family/friends  Treatment Modalities: Medication Management, Group therapy, Case management,  1 to 1 session with clinician, Psychoeducation, Recreational therapy.   Physician Treatment Plan for Primary Diagnosis: Bipolar affective disorder, current episode manic with psychotic symptoms ([name]) Long Term Goal(s): Improvement in symptoms so as ready for discharge Improvement in symptoms so as ready for discharge   Short Term Goals: Ability to identify changes in lifestyle to reduce recurrence of condition will improve Ability to verbalize feelings will improve Ability to disclose and discuss suicidal ideas Ability to identify and develop effective coping behaviors will improve Ability to identify and develop effective coping behaviors will improve Compliance with prescribed medications will improve Ability to identify triggers associated with substance abuse/mental health issues will improve  Medication Management: Evaluate patient's response, side effects, and tolerance of medication regimen.  Therapeutic Interventions: 1 to 1 sessions, Unit Group sessions and Medication administration.  Evaluation of Outcomes: Progressing  Physician Treatment Plan for  Secondary Diagnosis: Principal Problem:   Bipolar affective disorder, current episode manic with psychotic symptoms ([name]) Active Problems:   Affective psychosis, bipolar ([name])  Long Term Goal(s): Improvement in symptoms so as ready for discharge Improvement in symptoms so as ready for discharge   Short Term Goals: Ability to identify changes in lifestyle to  reduce recurrence of condition will improve Ability to verbalize feelings will improve Ability to disclose and discuss suicidal ideas Ability to identify and develop effective coping behaviors will improve Ability to identify and develop effective coping behaviors will improve Compliance with prescribed medications will improve Ability to identify triggers associated with substance abuse/mental health issues will improve     Medication Management: Evaluate patient's response, side effects, and tolerance of medication regimen.  Therapeutic Interventions: 1 to 1 sessions, Unit Group sessions and Medication administration.  Evaluation of Outcomes: Progressing   RN Treatment Plan for Primary Diagnosis: Bipolar affective disorder, current episode manic with psychotic symptoms ([name]) Long Term Goal(s): Knowledge of disease and therapeutic regimen to maintain health will improve  Short Term Goals: Ability to remain free from injury will improve and Ability to participate in decision making will improve  Medication Management: RN will administer medications as ordered by provider, will assess and evaluate patient's response and provide education to patient for prescribed medication. RN will report any adverse and/or side effects to prescribing provider.  Therapeutic Interventions: 1 on 1 counseling sessions, Psychoeducation, Medication administration, Evaluate responses to treatment, Monitor vital signs and CBGs as ordered, Perform/monitor CIWA, COWS, AIMS and Fall Risk screenings as ordered, Perform wound care treatments as ordered.  Evaluation of Outcomes: Progressing   LCSW Treatment Plan for Primary Diagnosis: Bipolar affective disorder, current episode manic with psychotic symptoms ([name]) Long Term Goal(s): Safe transition to appropriate next level of care at discharge, Engage patient in therapeutic group addressing interpersonal concerns.  Short Term Goals: Engage patient in aftercare  planning with referrals and resources and Increase social support  Therapeutic Interventions: Assess for all discharge needs, 1 to 1 time with Social worker, Explore available resources and support systems, Assess for adequacy in community support network, Educate family and significant other(s) on suicide prevention, Complete Psychosocial Assessment, Interpersonal group therapy.  Evaluation of Outcomes: Progressing   Progress in Treatment: Attending groups: Yes. Participating in groups: No. Taking medication as prescribed: Yes. Toleration medication: Yes. Family/Significant other contact made: Yes, individual(s) contacted:  sister Patient understands diagnosis: No. Discussing patient identified problems/goals with staff: No. Medical problems stabilized or resolved: Yes. Denies suicidal/homicidal ideation: Yes. Issues/concerns per patient self-inventory: No. Other: patient remains on a 1:1 due to catatonic behavior  New problem(s) identified: none currently  New Short Term/Long Term Goal(s): NA  Discharge Plan or Barriers:   Patient active with [facility]. Will address next appointment and set up medication appointment. Patient lives alone, will discuss with family if this is safe for patient to return  Reason for Continuation of Hospitalization: Anxiety Depression Hallucinations Medication stabilization  Estimated Length of Stay:3-5 days  Attendees: Patient:  [name] 08/07/2016 12:57 PM  Physician:  Dr. Shea Evans, MD 08/07/2016 12:57 PM  Nursing: Rise Paganini, RN 08/07/2016 12:57 PM  RN Care Manager: Anderson Malta, RN CM 08/07/2016 12:57 PM  Social Worker:  Evaristo Bury 08/07/2016 12:57 PM  Recreational Therapist: Laretta Bolster, RT 08/07/2016 12:57 PM  Other:  08/07/2016 12:57 PM  Other:  08/07/2016 12:57 PM  Other: 08/07/2016 12:57 PM    Scribe for Treatment Team: Lilly Cove, LCSW 08/07/2016 12:57 PM

## 2016-08-07 NOTE — Plan of Care (Signed)
Problem: Education: Goal: Utilization of techniques to improve thought processes will improve Outcome: Progressing Nurse discussed depression/anxiety/coping skills with patient.    

## 2016-08-07 NOTE — Progress Notes (Signed)
Recreation Therapy Notes  Date: 08/07/16 Time: 1000 Location: 500 Hall Dayroom  Group Topic: Anger Management  Goal Area(s) Addresses:  Patient will identify triggers for anger.  Patient will identify physical reaction to anger.   Patient will identify benefit of using coping skills when angry.  Behavioral Response: Engaged  Intervention: Thermometer worksheet  Activity: Anger Thermometer.  Patients were to rank situations that get anger from 1-10 (1 being calm, 10 being the angriest).  Patients were to also give a small description of the situation.  Once the patients identified the situations that get them angry, they were to come up with coping skills to handle those situations.  Education: Anger Management, Discharge Planning   Education Outcome: Acknowledges education/In group clarification offered/Needs additional education.   Clinical Observations/Feedback: Pt stated she sometimes shows anger by yelling or screaming.  Pt explained that unpredictable situations, anxiousness and  conflict with others makes her angry.  Some of the coping skills pt identified are bowling, singing and try to talk to someone.  Pt expressed using her coping skills will help her be positive because "it helps you get through life easier."   Victorino Sparrow, LRT/CTRS       Ria Comment, Shaquita [place] A 08/07/2016 1:32 PM

## 2016-08-07 NOTE — [facility] Group Notes (Addendum)
[facility] LCSW Group Therapy  08/07/2016 3:28 PM  Type of Therapy:  Group Therapy  Participation Level:  None  Participation Quality:  Attentive and Drowsy  Affect:  Flat and Lethargic  Cognitive:  Alert  Insight:  Improving  Engagement in Therapy:  Limited and None  Modes of Intervention:  Discussion, Exploration and Problem-solving  Summary of Progress/Problems:  Group consisted of the topic: Overcoming obstacles.  Patients were engaged in a processing discussion regarding personal obstacles and how one is able to get over obstacles in the past and on a day to day basis.  [name] remained on a 1:1 throughout group and attempted to participate by giving and accurate definition regarding an obstacle.  She remains confused at times and would fall asleep, but when called upon to engage she did attempt and try.  She was able to define one obstacle and her goal to overcome it was to be with her kids and continue to help others.  [name] 08/07/2016, 3:28 PM

## 2016-08-07 NOTE — Progress Notes (Signed)
1:1 Note: UA obtained per MD order.  EKG completed and given to MD for review.  Patient has been on phone several times during morning.  Patient has been pleasant, cooperative but confused and slow to answer questions.  Respirations even and unlabored.  No signs/symptoms of pain/distress noted on patient's face/body movements.  1:1 continues for patient's safety per MD order.

## 2016-08-07 NOTE — Progress Notes (Signed)
1:1 Note: D:  Patient's self inventory sheet, patient has poor sleep, sleep medication not helpful.   Fair appetite, low energy level, poor concentration.  Denied SI.  Denied physical problems.  Has experienced blurred vision in past 24 hrs.   A:  Medications administered per MD orders.   Patient has denied SI and HI while talking to nurse, contracts for safety.  Denied A/V hallucinations.  Denied pain.   R:  Emotional support and encouragement given patient.   Safety maintained with 1:1 per MD order. Patient has been sitting in dayroom with 1:1 present.  Patient ate 100% of her lunch.

## 2016-08-07 NOTE — Progress Notes (Addendum)
1:1 Note: Patient in her room with 1:1 present for safety.  Patient slow to answer questions.  Patient is concerned about another patient and wants to talk to this patient.  Respirations even and unlabored.  No signs/symptoms of pain/distress noted on patient's face/body movements.  1:1 continues for patient safety per MD order.

## 2016-08-08 LAB — GLUCOSE, CAPILLARY
Glucose-Capillary: 169 mg/dL — ABNORMAL HIGH (ref 65–99)
Glucose-Capillary: 168 mg/dL — ABNORMAL HIGH (ref 65–99)
Glucose-Capillary: 176 mg/dL — ABNORMAL HIGH (ref 65–99)

## 2016-08-08 LAB — TSH: TSH: 0.575 u[IU]/mL (ref 0.350–4.500)

## 2016-08-08 MED ORDER — TRAZODONE HCL 100 MG PO TABS: 100.0000 mg | ORAL_TABLET | Freq: Every evening | ORAL | Status: DC | PRN

## 2016-08-08 MED ORDER — ZOLPIDEM TARTRATE 5 MG PO TABS: 5.0000 mg | ORAL_TABLET | Freq: Every day | ORAL | Status: DC

## 2016-08-08 MED ORDER — DOXEPIN HCL 10 MG PO CAPS: 20.0000 mg | ORAL_CAPSULE | Freq: Every day | ORAL | Status: DC

## 2016-08-08 MED ORDER — ZIPRASIDONE MESYLATE 20 MG IM SOLR: 10.0000 mg | Freq: Three times a day (TID) | INTRAMUSCULAR | Status: DC | PRN

## 2016-08-08 MED ORDER — LORAZEPAM 2 MG/ML IJ SOLN: 1.0000 mg | Freq: Three times a day (TID) | INTRAMUSCULAR | Status: DC | PRN

## 2016-08-08 MED ORDER — LORAZEPAM 1 MG PO TABS
1.0000 mg | ORAL_TABLET | Freq: Three times a day (TID) | ORAL | Status: DC | PRN
  Administered 2016-08-08: 1 mg via ORAL
  Filled 2016-08-08: qty 1

## 2016-08-08 MED ORDER — ZOLPIDEM TARTRATE 5 MG PO TABS
5.0000 mg | ORAL_TABLET | Freq: Every day | ORAL | Status: DC
  Administered 2016-08-08 – 2016-08-09 (×2): 5 mg via ORAL
  Filled 2016-08-08 (×2): qty 1

## 2016-08-08 MED ORDER — BENZTROPINE MESYLATE 0.5 MG PO TABS
0.5000 mg | ORAL_TABLET | ORAL | Status: DC
  Administered 2016-08-08 – 2016-08-14 (×12): 0.5 mg via ORAL
  Filled 2016-08-08 (×15): qty 1

## 2016-08-08 MED ORDER — ARIPIPRAZOLE 5 MG PO TABS
5.0000 mg | ORAL_TABLET | Freq: Once | ORAL | Status: AC
  Administered 2016-08-08: 5 mg via ORAL
  Filled 2016-08-08 (×2): qty 1

## 2016-08-08 MED ORDER — LORAZEPAM 0.5 MG PO TABS
0.5000 mg | ORAL_TABLET | Freq: Every day | ORAL | Status: DC
  Administered 2016-08-08 – 2016-08-13 (×6): 0.5 mg via ORAL
  Filled 2016-08-08 (×6): qty 1

## 2016-08-08 MED ORDER — RISPERIDONE 1 MG PO TBDP
1.0000 mg | ORAL_TABLET | Freq: Once | ORAL | Status: AC | PRN
  Administered 2016-08-08: 1 mg via ORAL
  Filled 2016-08-08: qty 1

## 2016-08-08 MED ORDER — ZIPRASIDONE HCL 20 MG PO CAPS
20.0000 mg | ORAL_CAPSULE | Freq: Three times a day (TID) | ORAL | Status: DC | PRN
  Administered 2016-08-08: 20 mg via ORAL
  Filled 2016-08-08: qty 1

## 2016-08-08 MED ORDER — HALOPERIDOL 5 MG PO TABS
5.0000 mg | ORAL_TABLET | ORAL | Status: DC
  Administered 2016-08-08 – 2016-08-14 (×12): 5 mg via ORAL
  Filled 2016-08-08 (×16): qty 1

## 2016-08-08 MED ORDER — ARIPIPRAZOLE 5 MG PO TABS: 5.0000 mg | ORAL_TABLET | Freq: Every day | ORAL | Status: DC

## 2016-08-08 MED ORDER — LORAZEPAM 0.5 MG PO TABS
0.5000 mg | ORAL_TABLET | ORAL | Status: DC
  Administered 2016-08-08 – 2016-08-10 (×5): 0.5 mg via ORAL
  Filled 2016-08-08 (×5): qty 1

## 2016-08-08 NOTE — Progress Notes (Signed)
Patient remains on 1:1 due to paranoia and slow to respond and confusion.  Patient is unable to respond to questions asked by staff.  1:1 in close proximity to patient and patient's environment is secure.

## 2016-08-08 NOTE — Progress Notes (Signed)
Patient ID: [name], female   DOB: 1976/05/17, 41 y.o.   MRN: [id number] D: Client visible on the unit, seen on the phone, but no interactions with peers noticed. Client reports periods of confusion during the day "I thought they was having a party so many people on the hall" "then I thought I was at work" "things just didn't seem real" A: Writer provided emotional support reporting that it was night shift and client would be sleeping tonight and thereby creating a quieter atmosphere. Staff will monitor 1:1 for safety. R: Client is safe on the unit, did not attend group.

## 2016-08-08 NOTE — [facility] Group Notes (Signed)
Pt did not attend wrap up group meeting this evening.  

## 2016-08-08 NOTE — Progress Notes (Signed)
Recreation Therapy Notes  INPATIENT RECREATION THERAPY ASSESSMENT  Patient Details Name: [name] MRN: [id number] DOB: [birth year] Today's Date: 08/08/2016  Patient Stressors: Family, Other (Comment) (Children having trouble in school; car broke down; hold down blended family)  Coping Skills:   Isolate, Arguments, Substance Abuse, Exercise, Art/Dance, Talking, Music  Personal Challenges: Anger, Communication, Concentration, Decision-Making, Problem-Solving, Relationships, Self-Esteem/Confidence, Technical sales engineer  Leisure Interests (2+):  Social - Friends, Medical laboratory scientific officer - Theme park manager Resources:  Yes  Community Resources:  Other (Comment) ([place] alley; skating rink; water park)  Current Use: Yes  Patient Strengths:  Organized ; plan events  Patient Identified Areas of Improvement:  Impatient; addictive attitude (easy to persuade)  Current Recreation Participation:  [phone]  Patient Goal for Hospitalization:  "Get out with a positive outlook"  [place] of Residence:  [place] of Residence:  [place]  Current [place] (including self-harm):  No  Current [place]:  No  Consent to Intern Participation: N/A  Victorino Sparrow, LRT/CTRS  Victorino Sparrow A 08/08/2016, 3:02 PM

## 2016-08-08 NOTE — Progress Notes (Signed)
1:1 Note: Pt at this time is in bed resting with eyes closed. Pt does not look to be in any distress at this time. 1:1 staff is present in room with Pt at this time. 1:1 monitoring continues for Pt's safety. 15-minute safety checks also continues at this time. 

## 2016-08-08 NOTE — Progress Notes (Signed)
1:1 Note   Pt at this time is in bed resting with eyes closed. Pt does not look to be in any distress at this time. Earlier Pt was preoccupied about going home. Pt continues to be confused and paranoid. 1:1 staff is present in room with Pt at this time. 1:1 monitoring continues for Pt's safety. 15-minute safety checks also continues at this time.

## 2016-08-08 NOTE — Progress Notes (Signed)
1:1 Note   Pt at this time is in room. Pt continue to be paranoid and almost in a catatonic state-Pt find it difficult to express herself; find it difficult to get her words out. 1:1 staff is present in room with Pt. 1:1 monitoring continues for Pt's safety. 15-minute safety checks also continues at this time.

## 2016-08-08 NOTE — Progress Notes (Signed)
Recreation Therapy Notes  Date: 08/08/16 Time: 1000 Location: 500 Hall Dayroom  Group Topic: Self-Expression  Goal Area(s) Addresses:  Patient will identify positive ways to express themselves. Patient will verbalize benefit of self-expression.  Intervention: Architect paper, scissors, glue sticks, magazines  Activity: Collage.  Patients were given construction paper, scissors, glue sticks and magazines.  Patients were to find pictures and words to describe things they like and gives a scene of who they are as a person.  Education: Self-Expression, Dentist.   Education Outcome: Acknowledges education/In group clarification offered/Needs additional education  Clinical Observations/Feedback: Pt did not attend group.   Victorino Sparrow, LRT/CTRS     Ria Comment, Jessalynn Mccowan A 08/08/2016 1:20 PM

## 2016-08-08 NOTE — Progress Notes (Signed)
[facility] MD Progress Note  08/08/2016 12:49 PM Shonie Gimenez  MRN:  [id number] Subjective: Patient states " Where, where , where, where.'      Objective:Patient seen and chart reviewed.Discussed patient with treatment team.  Patient seen today as staring in to space, is seen as holding her head with both her hands and stating " I am going crazy." Pt unable to respond appropriately to questions asked, rather seen as having severe thought blocking . Pt also seen as very slow , anxious , has psychomotor retardation often and has ? Catatonic sx. Pt per collateral obtained per CSW from sister ( please see EHR) - this episode was triggered by a trauma that happened 10 yrs ago , her friend was killed and recently a film and foundation was created and patient was involved in this which led to her current decompensation.  Pt per RN did not sleep last night, appears guarded, lost , confused at times. Pt continues to need 1;1 precaution for safety reasons.     Principal Problem: Bipolar disorder, curr episode mixed, severe, with psychotic features ([place]) Diagnosis:   Patient Active Problem List   Diagnosis Date Noted  . Bipolar disorder, curr episode mixed, severe, with psychotic features ([place]) [F31.64] 08/07/2016  . Cannabis use disorder, moderate, dependence ([place]) [F12.20] 08/07/2016  . CHOLECYSTITIS, UNSPECIFIED [K81.9] 06/12/2008  . BILIARY COLIC [id number]  . CERUMEN IMPACTION, BILATERAL [H61.20] 01/17/2008  . PHARYNGITIS, VIRAL [J02.9] 01/17/2008  . NECK PAIN [M54.2] 01/17/2008   Total Time spent with patient: 25 minutes  Past Psychiatric History: has had multiple hospitalizations in the past - 2006, 2008, 2015. Pt reports past hx of Bipolar disorder . Reports she follows up with [facility]. Denies past suicide attempts.  Past Medical History:  Past Medical History:  Diagnosis Date  . Abnormal Pap smear   . Bipolar 1 disorder ([place])   . Fibroids   . IBS (irritable bowel syndrome)     Past Surgical History:  Procedure Laterality Date  . CERVICAL BIOPSY    . CHOLECYSTECTOMY    . TUBAL LIGATION     Family History:  Family History  Problem Relation Age of Onset  . Diabetes Father   . Diabetes Paternal Grandmother   . Diabetes Maternal Grandmother   . Mental illness Cousin    Family Psychiatric  History: Her cousin has mental illness, denies otherwise. Social History: Is separated , lives in [place] , has two kids , who are with neighbors or friends at this time due to her being in hospital, works as a Control and instrumentation engineer.  History  Alcohol Use  . Yes    Comment: occasional     History  Drug Use    Comment: CBD oil    Social History   Social History  . Marital status: Married    Spouse name: N/A  . Number of children: N/A  . Years of education: N/A   Social History Main Topics  . Smoking status: Current Every Day Smoker    Packs/day: 0.25    Years: 0.00    Types: Cigarettes  . Smokeless tobacco: Never Used  . Alcohol use Yes     Comment: occasional  . Drug use: Yes     Comment: CBD oil  . Sexual activity: Not Currently   Other Topics Concern  . None   Social History Narrative  . None   Additional Social History:    Pain Medications: denies Prescriptions: See mar Over the Counter: none History of  alcohol / drug use?: No history of alcohol / drug abuse                    Sleep: Poor  Appetite:  Fair  Current Medications: Current Facility-Administered Medications  Medication Dose Route Frequency Provider Last Rate Last Dose  . acetaminophen (TYLENOL) tablet 650 mg  650 mg Oral Q6H PRN Ethelene Hal, NP      . alum & mag hydroxide-simeth (MAALOX/MYLANTA) 200-200-20 MG/5ML suspension 30 mL  30 mL Oral Q4H PRN Ethelene Hal, NP      . benztropine (COGENTIN) tablet 0.5 mg  0.5 mg Oral BH-qamhs Lyncoln Ledgerwood, MD      . divalproex (DEPAKOTE ER) 24 hr tablet 750 mg  750 mg Oral QHS Albirda Shiel, MD   750 mg at 08/07/16  2129  . haloperidol (HALDOL) tablet 5 mg  5 mg Oral BH-qamhs Aharon Carriere, MD      . insulin aspart (novoLOG) injection 0-15 Units  0-15 Units Subcutaneous TID WC Ursula Alert, MD   3 Units at 08/08/16 0646  . insulin aspart (novoLOG) injection 0-5 Units  0-5 Units Subcutaneous QHS Saleha Kalp, MD      . LORazepam (ATIVAN) tablet 1 mg  1 mg Oral Q8H PRN Ursula Alert, MD   1 mg at 08/08/16 1121   Or  . LORazepam (ATIVAN) injection 1 mg  1 mg Intramuscular Q8H PRN Verita Kuroda, MD      . LORazepam (ATIVAN) tablet 0.5 mg  0.5 mg Oral BH-q8a2p Kalena Mander, MD      . LORazepam (ATIVAN) tablet 0.5 mg  0.5 mg Oral QHS Garrette Caine, MD      . magnesium hydroxide (MILK OF MAGNESIA) suspension 30 mL  30 mL Oral Daily PRN Ethelene Hal, NP      . traZODone (DESYREL) tablet 100 mg  100 mg Oral QHS PRN Ursula Alert, MD      . ziprasidone (GEODON) capsule 20 mg  20 mg Oral TID PRN Ursula Alert, MD   20 mg at 08/08/16 1121   Or  . ziprasidone (GEODON) injection 10 mg  10 mg Intramuscular TID PRN Ursula Alert, MD      . zolpidem (AMBIEN) tablet 5 mg  5 mg Oral QHS Ursula Alert, MD        Lab Results:  Results for orders placed or performed during the hospital encounter of 08/04/16 (from the past 48 hour(s))  Urinalysis, Complete w Microscopic     Status: Abnormal   Collection Time: 08/07/16  6:00 AM  Result Value Ref Range   Color, Urine YELLOW YELLOW   APPearance TURBID (A) CLEAR   Specific Gravity, Urine 1.028 1.005 - 1.030   pH 6.0 5.0 - 8.0   Glucose, UA NEGATIVE NEGATIVE mg/dL   Hgb urine dipstick NEGATIVE NEGATIVE   Bilirubin Urine NEGATIVE NEGATIVE   Ketones, ur NEGATIVE NEGATIVE mg/dL   Protein, ur NEGATIVE NEGATIVE mg/dL   Nitrite NEGATIVE NEGATIVE   Leukocytes, UA NEGATIVE NEGATIVE   RBC / HPF NONE SEEN 0 - 5 RBC/hpf   WBC, UA NONE SEEN 0 - 5 WBC/hpf   Bacteria, UA MANY (A) NONE SEEN   Squamous Epithelial / LPF 0-5 (A) NONE SEEN   Mucous PRESENT      Comment: Performed at [facility], [place] [address] [place], [place] [address]  Glucose, capillary     Status: Abnormal   Collection Time: 08/07/16 12:05 PM  Result  Value Ref Range   Glucose-Capillary 145 (H) 65 - 99 mg/dL  Glucose, capillary     Status: Abnormal   Collection Time: 08/07/16  4:50 PM  Result Value Ref Range   Glucose-Capillary 149 (H) 65 - 99 mg/dL   Comment 1 Notify RN    Comment 2 Document in Chart   Glucose, capillary     Status: Abnormal   Collection Time: 08/07/16  8:40 PM  Result Value Ref Range   Glucose-Capillary 156 (H) 65 - 99 mg/dL  Glucose, capillary     Status: Abnormal   Collection Time: 08/08/16  5:55 AM  Result Value Ref Range   Glucose-Capillary 169 (H) 65 - 99 mg/dL  TSH     Status: None   Collection Time: 08/08/16  6:23 AM  Result Value Ref Range   TSH 0.575 0.350 - 4.500 uIU/mL    Comment: Performed by a 3rd Generation assay with a functional sensitivity of <=0.01 uIU/mL. Performed at [facility], [place] [address] [place], [place] [address]     Blood Alcohol level:  Lab Results  Component Value Date   [facility] <5 08/03/2016   ETH <5 [id number]    Metabolic Disorder Labs: Lab Results  Component Value Date   HGBA1C 7.6 (H) 08/06/2016   MPG 171 08/06/2016   Lab Results  Component Value Date   PROLACTIN 8.5 08/06/2016   Lab Results  Component Value Date   CHOL 162 08/06/2016   TRIG 80 08/06/2016   HDL 40 (L) 08/06/2016   CHOLHDL 4.1 08/06/2016   VLDL 16 08/06/2016   LDLCALC 106 (H) 08/06/2016    Physical Findings: AIMS: Facial and Oral Movements Muscles of Facial Expression: None, normal Lips and Perioral Area: None, normal Jaw: None, normal Tongue: None, normal,Extremity Movements Upper (arms, wrists, hands, fingers): None, normal Lower (legs, knees, ankles, toes): None, normal, Trunk Movements Neck, shoulders, hips: None, normal, Overall Severity Severity of abnormal movements (highest  score from questions above): None, normal Incapacitation due to abnormal movements: None, normal Patient's awareness of abnormal movements (rate only patient's report): No Awareness, Dental Status Current problems with teeth and/or dentures?: No Does patient usually wear dentures?: No  CIWA:  CIWA-Ar Total: 1 COWS:  COWS Total Score: 2  Musculoskeletal: Strength & Muscle Tone: within normal limits Gait & Station: normal Patient leans: N/A  Psychiatric Specialty Exam: Physical Exam  Nursing note and vitals reviewed.   Review of Systems  Unable to perform ROS: Mental acuity    Blood pressure (!) 143/76, pulse 96, temperature 98.8 F (37.1 C), temperature source Oral, resp. rate 18, height 5\' 11"  (1.803 m), weight 132.5 kg (292 lb), last menstrual period 08/03/2016, SpO2 100 %.Body mass index is 40.73 kg/m.  General Appearance: Guarded  Eye Contact:  Poor  Speech:  Blocked and Slow  Volume:  Decreased  Mood:  Anxious and Dysphoric  Affect:  Congruent  Thought Process:  Irrelevant and Descriptions of Associations: Loose  Orientation:  Other:  to self  Thought Content:  Delusions, Ideas of Reference:   Paranoia Delusions and Paranoid Ideation  Suicidal Thoughts:  Did not express any - has difficulty communicating , has thought blocking  Homicidal Thoughts:  did not express any  Memory:  Immediate;   UTA Recent;   UTA Remote;   UTA  Judgement:  Impaired  Insight:  Shallow  Psychomotor Activity:  Decreased  Concentration:  Concentration: Poor and Attention Span: Poor  Recall:  Poor  Fund of Knowledge:  Poor  Language:  Poor  Akathisia:  No  Handed:  Right  AIMS (if indicated):     Assets:  Desire for Improvement  ADL's:  Intact  Cognition:  Impaired,  Mild  Sleep:  Number of Hours: 4.75     Treatment Plan Summary:Patient today seen as anxious , has thought blocking , has sleep issues , unable to participate in evaluation, continues to need 1:1 precaution for safety  reasons.    Bipolar disorder, curr episode mixed, severe, with psychotic features ([place]) unstable   Will continue today 08/08/16 plan as below except where it is noted.  Daily contact with patient to assess and evaluate symptoms and progress in treatment and Medication management  Reviewed past medical records,treatment plan.   For Bipolar disorder: Will discontinue Abilify for lack of efficacy- taper it down to 5 mg today and discontinue. Will add Haldol 5 mg po bid for psychosis. Continue Cogentin 0.5 mg po bid  for EPS. Add Depakote ER 750 mg po qhs for mood sx. Depakote level in 5 days.  For anxiety sx: Will change Ativan to 0.5 mg po tid , will taper down slowly.  For insomnia: Will add Ambien 5 mg po qhs. Trazodone  100 mg po qhs prn.  For elevated Hba1c:  CBG monitoring.  Will continue 1;1 precaution for safety reasons.  Will continue to make PRN medications available for anxiety/agitation.  Will continue to monitor vitals ,medication compliance and treatment side effects while patient is here.   Will monitor for medical issues as well as call consult as needed.   Reviewed labs- tsh- wnl , UA- wnl , pending urine clx.  CSW will continue working on disposition.   Patient to participate in therapeutic milieu .      Jahnya Trindade, MD 08/08/2016, 12:49 PM

## 2016-08-09 LAB — URINE CULTURE
Culture: <10000 — AB
Special Requests: NORMAL

## 2016-08-09 LAB — GLUCOSE, CAPILLARY
Glucose-Capillary: 161 mg/dL — ABNORMAL HIGH (ref 65–99)
Glucose-Capillary: 186 mg/dL — ABNORMAL HIGH (ref 65–99)
Glucose-Capillary: 115 mg/dL — ABNORMAL HIGH (ref 65–99)
Glucose-Capillary: 138 mg/dL — ABNORMAL HIGH (ref 65–99)

## 2016-08-09 MED ORDER — METFORMIN HCL 500 MG PO TABS
500.0000 mg | ORAL_TABLET | Freq: Two times a day (BID) | ORAL | Status: DC
  Administered 2016-08-09 – 2016-08-14 (×10): 500 mg via ORAL
  Filled 2016-08-09 (×13): qty 1

## 2016-08-09 NOTE — [facility] Group Notes (Signed)
[place] LCSW Group Therapy  08/09/2016 2:27 PM   Type of Therapy:  Group Therapy   Participation Level:  Engaged  Participation Quality:  Attentive  Affect:  Appropriate   Cognitive:  Alert   Insight:  Engaged  Engagement in Therapy:  Improving   Modes of Intervention:  Education, Exploration, Socialization   Summary of Progress/Problems: [name] was engaged throughout and stayed entire time.   Shanon Brow from the [place] was here to tell his story of recovery, inform patients about [facility] and play his guitar.   [name] 08/09/2016 2:27 PM

## 2016-08-09 NOTE — Plan of Care (Signed)
Problem: Education: Goal: Ability to state activities that reduce stress will improve Outcome: Progressing Nurse discussed depression/anxiety/coping skills with patient.    

## 2016-08-09 NOTE — Progress Notes (Signed)
1:1 Note   [id number] Patient attended afternoon group and presently talking on phone to friend.  Respirations even and unlabored.  No signs/symptoms of pain/distress noted on patient's face/body movements.  1:1 continues for patient's safety per MD order.

## 2016-08-09 NOTE — Progress Notes (Signed)
[facility] MD Progress Note  08/09/2016 11:01 AM [name]  MRN:  [id number] Subjective: Patient states " I was so confused , first I thought all schools were shut down and I wanted to know where my children where, then I felt that the TV was talking to me. I feel better now.'      Objective:Patient seen and chart reviewed.Discussed patient with treatment team.  Pt today seen as delayed , continues to have thought blocking , is slow to respond, and has psychomotor retardation. Pt today was able to communicate better about what she felt . Pt reports her irrelevant thoughts are better today and she is thinking better than yesterday. Pt continues to have a restricted affect, staring often , but is reactive on and off. Pt has been tolerating Haldol , denies ADRs. Pt has been making use of PRN geodon, ativan for anxiety/agitation. Pt continues to be on 1:1 precaution for safety reasons/confusion.        Principal Problem: Bipolar disorder, curr episode mixed, severe, with psychotic features ([place]) Diagnosis:   Patient Active Problem List   Diagnosis Date Noted  . Bipolar disorder, curr episode mixed, severe, with psychotic features ([place]) [F31.64] 08/07/2016  . Cannabis use disorder, moderate, dependence ([place]) [F12.20] 08/07/2016  . CHOLECYSTITIS, UNSPECIFIED [K81.9] 06/12/2008  . BILIARY COLIC [id number]  . CERUMEN IMPACTION, BILATERAL [H61.20] 01/17/2008  . PHARYNGITIS, VIRAL [J02.9] 01/17/2008  . NECK PAIN [M54.2] 01/17/2008   Total Time spent with patient: 25 minutes  Past Psychiatric History: has had multiple hospitalizations in the past - 2006, 2008, 2015. Pt reports past hx of Bipolar disorder . Reports she follows up with [facility]. Denies past suicide attempts.  Past Medical History:  Past Medical History:  Diagnosis Date  . Abnormal Pap smear   . Bipolar 1 disorder ([place])   . Fibroids   . IBS (irritable bowel syndrome)     Past Surgical History:  Procedure Laterality  Date  . CERVICAL BIOPSY    . CHOLECYSTECTOMY    . TUBAL LIGATION     Family History:  Family History  Problem Relation Age of Onset  . Diabetes Father   . Diabetes Paternal Grandmother   . Diabetes Maternal Grandmother   . Mental illness Cousin    Family Psychiatric  History: Her cousin has mental illness, denies otherwise. Social History: Is separated , lives in [place] , has two kids , who are with neighbors or friends at this time due to her being in hospital, works as a Control and instrumentation engineer.  History  Alcohol Use  . Yes    Comment: occasional     History  Drug Use    Comment: CBD oil    Social History   Social History  . Marital status: Married    Spouse name: N/A  . Number of children: N/A  . Years of education: N/A   Social History Main Topics  . Smoking status: Current Every Day Smoker    Packs/day: 0.25    Years: 0.00    Types: Cigarettes  . Smokeless tobacco: Never Used  . Alcohol use Yes     Comment: occasional  . Drug use: Yes     Comment: CBD oil  . Sexual activity: Not Currently   Other Topics Concern  . None   Social History Narrative  . None   Additional Social History:    Pain Medications: denies Prescriptions: See mar Over the Counter: none History of alcohol / drug use?: No history of  alcohol / drug abuse                    Sleep: Fair  Appetite:  Fair  Current Medications: Current Facility-Administered Medications  Medication Dose Route Frequency Provider Last Rate Last Dose  . acetaminophen (TYLENOL) tablet 650 mg  650 mg Oral Q6H PRN Ethelene Hal, NP      . alum & mag hydroxide-simeth (MAALOX/MYLANTA) 200-200-20 MG/5ML suspension 30 mL  30 mL Oral Q4H PRN Ethelene Hal, NP      . benztropine (COGENTIN) tablet 0.5 mg  0.5 mg Oral BH-qamhs Farheen Pfahler, MD   0.5 mg at 08/09/16 0735  . divalproex (DEPAKOTE ER) 24 hr tablet 750 mg  750 mg Oral QHS Zakkary Thibault, MD   750 mg at 08/08/16 2121  . haloperidol  (HALDOL) tablet 5 mg  5 mg Oral BH-qamhs Flay Ghosh, MD   5 mg at 08/09/16 0736  . insulin aspart (novoLOG) injection 0-15 Units  0-15 Units Subcutaneous TID WC Ursula Alert, MD   3 Units at 08/09/16 0618  . insulin aspart (novoLOG) injection 0-5 Units  0-5 Units Subcutaneous QHS Manaal Mandala, MD      . LORazepam (ATIVAN) tablet 1 mg  1 mg Oral Q8H PRN Ursula Alert, MD   1 mg at 08/08/16 1121   Or  . LORazepam (ATIVAN) injection 1 mg  1 mg Intramuscular Q8H PRN Zeplin Aleshire, MD      . LORazepam (ATIVAN) tablet 0.5 mg  0.5 mg Oral BH-[id number] Ursula Alert, MD   0.5 mg at 08/09/16 0736  . LORazepam (ATIVAN) tablet 0.5 mg  0.5 mg Oral QHS Ursula Alert, MD   0.5 mg at 08/08/16 2122  . magnesium hydroxide (MILK OF MAGNESIA) suspension 30 mL  30 mL Oral Daily PRN Ethelene Hal, NP      . traZODone (DESYREL) tablet 100 mg  100 mg Oral QHS PRN Ursula Alert, MD      . ziprasidone (GEODON) capsule 20 mg  20 mg Oral TID PRN Ursula Alert, MD   20 mg at 08/08/16 1121   Or  . ziprasidone (GEODON) injection 10 mg  10 mg Intramuscular TID PRN Ursula Alert, MD      . zolpidem (AMBIEN) tablet 5 mg  5 mg Oral QHS Ursula Alert, MD   5 mg at 08/08/16 2122    Lab Results:  Results for orders placed or performed during the hospital encounter of 08/04/16 (from the past 48 hour(s))  Glucose, capillary     Status: Abnormal   Collection Time: 08/07/16 12:05 PM  Result Value Ref Range   Glucose-Capillary 145 (H) 65 - 99 mg/dL  Glucose, capillary     Status: Abnormal   Collection Time: 08/07/16  4:50 PM  Result Value Ref Range   Glucose-Capillary 149 (H) 65 - 99 mg/dL   Comment 1 Notify RN    Comment 2 Document in Chart   Glucose, capillary     Status: Abnormal   Collection Time: 08/07/16  8:40 PM  Result Value Ref Range   Glucose-Capillary 156 (H) 65 - 99 mg/dL  Glucose, capillary     Status: Abnormal   Collection Time: 08/08/16  5:55 AM  Result Value Ref Range   Glucose-Capillary  169 (H) 65 - 99 mg/dL  TSH     Status: None   Collection Time: 08/08/16  6:23 AM  Result Value Ref Range   TSH 0.575 0.350 - 4.500 uIU/mL  Comment: Performed by a 3rd Generation assay with a functional sensitivity of <=0.01 uIU/mL. Performed at [facility], [facility] [address], [place], [place] [address]   Glucose, capillary     Status: Abnormal   Collection Time: 08/08/16  5:31 PM  Result Value Ref Range   Glucose-Capillary 168 (H) 65 - 99 mg/dL  Glucose, capillary     Status: Abnormal   Collection Time: 08/08/16  8:30 PM  Result Value Ref Range   Glucose-Capillary 176 (H) 65 - 99 mg/dL   Comment 1 Notify RN   Glucose, capillary     Status: Abnormal   Collection Time: 08/09/16  5:58 AM  Result Value Ref Range   Glucose-Capillary 161 (H) 65 - 99 mg/dL    Blood Alcohol level:  Lab Results  Component Value Date   ETH <5 08/03/2016   ETH <5 [id number]    Metabolic Disorder Labs: Lab Results  Component Value Date   HGBA1C 7.6 (H) 08/06/2016   MPG 171 08/06/2016   Lab Results  Component Value Date   PROLACTIN 8.5 08/06/2016   Lab Results  Component Value Date   CHOL 162 08/06/2016   TRIG 80 08/06/2016   HDL 40 (L) 08/06/2016   CHOLHDL 4.1 08/06/2016   VLDL 16 08/06/2016   LDLCALC 106 (H) 08/06/2016    Physical Findings: AIMS: Facial and Oral Movements Muscles of Facial Expression: None, normal Lips and Perioral Area: None, normal Jaw: None, normal Tongue: None, normal,Extremity Movements Upper (arms, wrists, hands, fingers): None, normal Lower (legs, knees, ankles, toes): None, normal, Trunk Movements Neck, shoulders, hips: None, normal, Overall Severity Severity of abnormal movements (highest score from questions above): None, normal Incapacitation due to abnormal movements: None, normal Patient's awareness of abnormal movements (rate only patient's report): No Awareness, Dental Status Current problems with teeth and/or dentures?: No Does  patient usually wear dentures?: No  CIWA:  CIWA-Ar Total: 1 COWS:  COWS Total Score: 2  Musculoskeletal: Strength & Muscle Tone: within normal limits Gait & Station: normal Patient leans: N/A  Psychiatric Specialty Exam: Physical Exam  Nursing note and vitals reviewed.   Review of Systems  Psychiatric/Behavioral: Positive for hallucinations. The patient is nervous/anxious.   All other systems reviewed and are negative.   Blood pressure 133/83, pulse 97, temperature 98.5 F (36.9 C), temperature source Oral, resp. rate 16, height 5\' 11"  (1.803 m), weight 132.5 kg (292 lb), last menstrual period 08/03/2016, SpO2 100 %.Body mass index is 40.73 kg/m.  General Appearance: Guarded  Eye Contact:  Fair  Speech:  Blocked and Slow  Volume:  Decreased  Mood:  Anxious and Dysphoric  Affect:  Congruent  Thought Process:  Irrelevant and Descriptions of Associations: Circumstantial  Orientation:  Other:  self, time, situation  Thought Content:  Delusions, Ideas of Reference:   Paranoia Delusions and Paranoid Ideation  Suicidal Thoughts:  No  Homicidal Thoughts:  No  Memory:  Immediate;   Fair Recent;   Fair Remote;   Fair  Judgement:  Impaired  Insight:  Shallow  Psychomotor Activity:  Decreased  Concentration:  Concentration: Poor and Attention Span: Poor  Recall:  [employer] of Knowledge:  Fair  Language:  Fair  Akathisia:  No  Handed:  Right  AIMS (if indicated):     Assets:  Desire for Improvement  ADL's:  Intact  Cognition:  WNL  Sleep:  Number of Hours: 5.75     Treatment Plan Summary: Patient today seen as making progress, is  able to better communicate , although has continued thought blocking and delusions. Will continue 1:1 precaution for safety reasons.   Bipolar disorder, curr episode mixed, severe, with psychotic features ([place]) unstable Will continue today 08/09/16 plan as below except where it is noted.  Daily contact with patient to assess and evaluate  symptoms and progress in treatment and Medication management Reviewed past medical records,treatment plan.   For Bipolar disorder: Will continue Haldol 5 mg po bid for psychosis. Continue Cogentin 0.5 mg po bid  for EPS. Add Depakote ER 750 mg po qhs for mood sx. Depakote level on 08/11/16.  For anxiety sx: Will change Ativan to 0.5 mg po tid , will taper down slowly.  For insomnia: Will continue Ambien 5 mg po qhs. Trazodone  100 mg po qhs prn.  For elevated Hba1c:  CBG monitoring.  Will continue 1;1 precaution for safety reasons.  Will continue to make PRN medications available for anxiety/agitation.  Will continue to monitor vitals ,medication compliance and treatment side effects while patient is here.   Will monitor for medical issues as well as call consult as needed.   Reviewed labs- tsh- wnl , UA- wnl ,  urine clx - wnl.  CSW will continue working on disposition.   Patient to participate in therapeutic milieu .      Blimy Napoleon, MD 08/09/2016, 11:01 AM

## 2016-08-09 NOTE — Progress Notes (Signed)
1:1  Note 0800 Patient sitting on side of her bed with 1:1 present for safety.  Patient denied SI.  Stated she called her landlord and was told that her apartment is safe, no problems.  Patient stated she is concerned about her children and work.  That she needs to be discharged to take care of things for herself and her family.  Patient stated she does not have tremors.   Patient slept 5.75 hours last night.  Respirations even and unlabored.  No signs/symptoms of pain/distress noted on patient's face/body movements.  1:1 Note 0915 Patient sitting in dayroom with 1:1 present.  Patient denied SI and HI.  Denied A/V hallucinations.  Patient stated she feels very tired today.  Encouraged patient to rest in her room.   Patient was given diet ginger ale.  Respirations even and unlabored.  No signs/symptoms of pain/distress noted on patient's face/body movements.  1:1 continues per MD order for safety

## 2016-08-09 NOTE — Progress Notes (Signed)
1:1 Note 1720 Patient's CBG was 115, no insulin required per MD order.  Patient did take the metformin 500 mg po that was ordered by MD.  Patient stated "I'm am feeling so much better."  Patient will discuss medications with MD tomorrow.  Respirations even and unlabored.  No signs/symptoms of pain/distress noted on patient's face/body movements.  1:1 continues for safety.

## 2016-08-09 NOTE — Progress Notes (Signed)
1:1 Note: 1900 Patient sitting in her room with visitor and 1:1 present for safety.  Respirations even and unlabored.  No signs/symptoms of pain/distress noted on patient's face/body movements.  Safety maintained with 1:1 per MD order.

## 2016-08-09 NOTE — Progress Notes (Signed)
1:1 Note   [id number] Patient sitting in dayroom with 1:1 present for safety.  Patient denied SI and HI.  Denied A/V hallucinations.  Denied pain.  Patient ate approximately 50% of her lunch.  Respirations even and unlabored.  No signs/symptoms of pain/distress noted on patient's face/body movements.  1:1 continues for safety per MD order.

## 2016-08-09 NOTE — Progress Notes (Addendum)
1:1 Note 1400 Patient attended group with 1:1 present for safety.  Respirations even and unlabored.  No signs/symptoms of pain/distress noted on patient's face/body movements. 1:1 continues for patient's safety.  Patient's self inventory sheet, patient has fair sleep, sleep medication helpful.  Fair appetite, normal energy level, poor concentration.  Denied depression.  Rated hopeless 2, anxiety 5.  Withdrawals, agitation, runny nose, irritability.  Denied SI, then checked sometimes.  Physical problems, lightheaded, blurred vision.  Denied pain.  Goal is return home by learning how to cope with real life and occasional hardships.  Plans to follow directions, make plans to return home.  Plans to get blood sugar under control and make transportation arrangements.

## 2016-08-09 NOTE — Progress Notes (Signed)
Recreation Therapy Notes   Date: 08/09/16 Time: 1000 Location: 500 Hall Dayroom  Group Topic: Wellness  Goal Area(s) Addresses:  Patient will define components of whole wellness. Patient will verbalize benefit of whole wellness.  Behavioral Response: Engaged  Intervention:  [place], chairs  Activity: Seated volleyball.  Patients were broken up into two teams.  Patients were seated across the room from each other.  Patients scored points by getting the ball past the other team.  The first team to reach 20 points was the winner.   Education: Wellness, Dentist.   Education Outcome: Acknowledges education/In group clarification offered/Needs additional education.   Clinical Observations/Feedback:  Pt was flat but engaged during group.  Pt appeared to enjoy the activity.  Pt stated the activity "helped me forget about my problems."    Victorino Sparrow, LRT/CTRS      Victorino Sparrow A 08/09/2016 11:32 AM

## 2016-08-09 NOTE — Progress Notes (Signed)
[facility] Group Notes:  (Nursing/MHT/Case Management/Adjunct)  Date:  08/09/2016  Time:  8:57 PM  Type of Therapy:  Psychoeducational Skills  Participation Level:  Active  Participation Quality:  Attentive  Affect:  Blunted  Cognitive:  Appropriate  Insight:  Good  Engagement in Group:  Improving  Modes of Intervention:  Education  Summary of Progress/Problems: Patient shared with the group that she had a good day overall since she "felt more positive". In addition, she also mentioned that she worked on her coping skills and trying to deal with stress. Finally, she verbalized that she had a good visit with one of her coworkers. She did not expect to have any visitors from work and stated that she was appreciative that this person took the time to see her. The patient's goal for tomorrow is to eat properly so as to improve her blood sugar. She would also like to work on relaxing more often and worrying less.   Archie Balboa S 08/09/2016, 8:57 PM

## 2016-08-10 DIAGNOSIS — E119 Type 2 diabetes mellitus without complications: Secondary | ICD-10-CM

## 2016-08-10 LAB — GLUCOSE, CAPILLARY
Glucose-Capillary: 155 mg/dL — ABNORMAL HIGH (ref 65–99)
Glucose-Capillary: 115 mg/dL — ABNORMAL HIGH (ref 65–99)
Glucose-Capillary: 131 mg/dL — ABNORMAL HIGH (ref 65–99)
Glucose-Capillary: 154 mg/dL — ABNORMAL HIGH (ref 65–99)

## 2016-08-10 MED ORDER — ZOLPIDEM TARTRATE 10 MG PO TABS
10.0000 mg | ORAL_TABLET | Freq: Every day | ORAL | Status: DC
  Administered 2016-08-10 – 2016-08-13 (×4): 10 mg via ORAL
  Filled 2016-08-10 (×4): qty 1

## 2016-08-10 MED ORDER — LORAZEPAM 0.5 MG PO TABS
0.5000 mg | ORAL_TABLET | Freq: Every day | ORAL | Status: DC
  Administered 2016-08-11 – 2016-08-13 (×3): 0.5 mg via ORAL
  Filled 2016-08-10 (×3): qty 1

## 2016-08-10 NOTE — Progress Notes (Signed)
  D: Pt was in the bathroom, but came out while the writer was in the room. Pt asked the writerif she was going to have to continue getting injections, since being started on the "pill".  Writer explained pt is on a sliding scale, that may or may not change after discharge.  Pt has no other questions or concerns.    A:  Support and encouragement was offered. 15 min checks continued for safety.  R: Pt remains safe.

## 2016-08-10 NOTE — [facility] Group Notes (Signed)
Type of Therapy: Process Group Therapy  Participation Level:  Active  Participation Quality:  Appropriate  Affect:  Flat  Cognitive:  Oriented  Insight:  Improving  Engagement in Group:  Limited  Engagement in Therapy:  Limited  Modes of Intervention:  Activity, Clarification, Education, Problem-solving and Support  Summary of Progress/Problems: [name] was engaged throughout and stayed entire time. [name] stated that she was semi balanced today because her emotions were high due to not seeing her children for a couple of days, but she stated that she was alright. [name] stated that when she feels unbalanced she likes to just relax and think about the things that make her feel unbalanced.   Today's group addressed balance in life. Patients participated in the discussion about when their life was in balance and out of balance and how this feels. Patients discussed ways to get back in balance and short term goals they can work on to get where they want to be.

## 2016-08-10 NOTE — Progress Notes (Signed)
Inpatient Diabetes Program Recommendations  AACE/ADA: New Consensus Statement on Inpatient Glycemic Control (2015)  Target Ranges:  Prepandial:   less than 140 mg/dL      Peak postprandial:   less than 180 mg/dL (1-2 hours)      Critically ill patients:  140 - 180 mg/dL   Results for [name], [name] (MRN [id number]) as of 08/10/2016 09:03  Ref. Range 08/09/2016 05:58 08/09/2016 11:37 08/09/2016 17:06 08/09/2016 20:35 08/10/2016 05:51  Glucose-Capillary Latest Ref Range: 65 - 99 mg/dL 161 (H) 186 (H) 115 (H) 138 (H) 155 (H)   Results for [name], [name] (MRN [id number]) as of 08/10/2016 09:03  Ref. Range 08/06/2016 06:22  Hemoglobin A1C Latest Ref Range: 4.8 - 5.6 % 7.6 (H)    Admit with: Bipolar disorder, curr episode mixed, severe, with psychotic features   Current Insulin Orders: Novolog Moderate Correction Scale/ SSI (0-15 units) TID AC + HS      Metformin 500 mg BID      MD- Note patient does not have a previous history of DM.  Does have a strong family history of DM per H&P.  Unsure if any of her home Pyschiatric Medications could be causing issues with elevated glucose?  Note Metformin 500 mg BID was started last evening with supper.  Agree with addition of Metformin.    Would not discharge patient home on insulin.  Metformin will likely be able to well control CBGs at home for now.       --Will follow patient during hospitalization--  Wyn Quaker RN, MSN, CDE Diabetes Coordinator Inpatient Glycemic Control Team Team Pager: [phone] (8a-5p)

## 2016-08-10 NOTE — Progress Notes (Signed)
   D:  Pt stated, "yesterday I couldn't focus because of all the stuff in my mind". Stated, she felt like the bldg was on fire, and that she saw smoke in the halls and room. Stated, "I was waiting for them to evacuate [place], so I couldn't think about what they were asking me", referring to hosp staff. Today was a good day. Stated, she had "swimming and light headedness earlier".  Pt has no other questions or concerns.    A:  Support and encouragement was offered. 15 min checks continued for safety.  R: Pt remains safe.

## 2016-08-10 NOTE — Progress Notes (Signed)
[facility] 1:1 Observation Documentation  For the first (8) hours following discontinuation of 1:1 precautions, a progress note entry by nursing staff should be documented at least every 2 hours, reflecting the patient's behavior, condition, mood, and conversation.  Use the progress notes for additional entries.  Time 1:1 discontinued:  0900   Patient's Behavior:  Patient is calm and resting.  Patient has not required any additional support or redirection    Patient's Condition:  Appropriate to situation   Patient's Conversation:  Patient denies SI, HI and AVH.  Patient is able to contract for safety.   Clarita Crane 08/10/2016, 11:04 AM

## 2016-08-10 NOTE — Progress Notes (Signed)
D : D:Pt laying in bed resting with eyes closed. Respirations even and unlabored. No distress noted. A: Monitor every 1:1  minutes for safety. R: Pt remains safe.

## 2016-08-10 NOTE — Plan of Care (Signed)
Problem: Health Behavior/Discharge Planning: Goal: Compliance with prescribed medication regimen will improve Outcome: Progressing Client compliance with prescribed medication regimen will improve AEB requesting scheduled medications and taking them without incident.

## 2016-08-10 NOTE — Progress Notes (Signed)
Recreation Therapy Notes  Date: 08/10/16 Time: 0950 Location: 500 Hall Dayroom  Group Topic: Coping Skills  Goal Area(s) Addresses:  Pt will be able to identify situations that require using coping skills. Pt will be able to identify the benefits of positive coping skills. Pt will be able to identify the benefits of using coping skills post d/c.  Behavioral Response: Engaged  Intervention: Building surveyor, pencils  Activity: Building surveyor.  Patients were given a worksheet with a blank spider web on it.  Patients were to imagine themselves being in the middle of the web.  Patients were to then write the things and situations within the web that have gotten them "stuck".  Once patients identified their struggles, they were to identify and write coping skills on the outside of the web the could use to deal with the situations they listed.   Education: Radiographer, therapeutic, Dentist.   Education Outcome: Acknowledges understanding/In group clarification offered/Needs additional education.   Clinical Observations/Feedback:  Pt stated coping skills are important because they help to keep you from "getting into trouble/going to jail, keep you from being in bad relationships, won't do drugs, they help you maintain and deal with unpredictable situations."  Pt went on to explain that she was here for not doing the right things, not taking her medications and for her car breaking down.  Pt identified her coping skills as stress management, singing, money management, taking a hot bath and taking medications.  Pt explained using her coping skills will help her keep a job and take care of her kids.  Pt became tearful when discussing the things that brought her to the hospital.   Victorino Sparrow, LRT/CTRS     Victorino Sparrow A 08/10/2016 11:49 AM

## 2016-08-10 NOTE — Progress Notes (Signed)
Patient ID: [name], female   DOB: 10/22/1975, 41 y.o.   MRN: [id number] D: Client is visible on the unit, visits with daughter and best friend. Client also had a friend come to get keys to car so he can get it started, client reports she wants it to be working in time for discharge. Client reports of her day "well I had a crying spell than I got that under control, went to group, ate lunch and made some good choices so my BS won't be elevated, then I called mechanic to see about my car, trying to be proactive" Client thought process seemed clearer today, although still slow. A: Writer provided emotional support, medications reviewed and administered as ordered. Staff will monitor q30min for safety. Client is safe on the unit.

## 2016-08-10 NOTE — Progress Notes (Signed)
[facility] 1:1 Observation Documentation  For the first (8) hours following discontinuation of 1:1 precautions, a progress note entry by nursing staff should be documented at least every 2 hours, reflecting the patient's behavior, condition, mood, and conversation.  Use the progress notes for additional entries.  Time 1:1 discontinued:  0900  Patient's Behavior:  Patient is walking calmly back onto the unit from lunch  Patient's Condition:  Patient is calm and cooperative and reporting no needs at this time  Patient's Conversation:  Patient is talking about what she ate at this time  Raphael Gibney 08/10/2016, 1:24 PM

## 2016-08-10 NOTE — Progress Notes (Signed)
[place] [facility] 1:1 Observation Documentation  For the first (8) hours following discontinuation of 1:1 precautions, a progress note entry by nursing staff should be documented at least every 2 hours, reflecting the patient's behavior, condition, mood, and conversation.  Use the progress notes for additional entries.  Time 1:1 discontinued:  0900  Patient's Behavior:  Patient is sitting in the dayroon  Patient's Condition:  Patient expression is flat and blunted  Patient's Conversation:  Patient is engaging with peers talking about home life and talking about her daughter and how much she misses her  Raphael Gibney 08/10/2016, 6:19 PM

## 2016-08-10 NOTE — Progress Notes (Signed)
[facility] MD Progress Note  08/10/2016 2:37 PM [name]  MRN:  [id number] Subjective: Patient states " I feel like my thoughts are more clearer. I was having a lot of thoughts that did not make sense before.'   Objective:Patient seen and chart reviewed.Discussed patient with treatment team.  Pt continues to have psychomotor retardation , but is making progress. Pt appears to respond to questions better , thought blocking seems to be improving. Pt reports sleep issues last night, will readjust her medications. Pt tolerating haldol , denies ADRs. Pt to be taken off the 1:1 precaution , will continue to monitor.      Principal Problem: Bipolar disorder, curr episode mixed, severe, with psychotic features ([place]) Diagnosis:   Patient Active Problem List   Diagnosis Date Noted  . Diabetes mellitus ([place]) [E11.9] 08/10/2016  . Bipolar disorder, curr episode mixed, severe, with psychotic features ([place]) [F31.64] 08/07/2016  . Cannabis use disorder, moderate, dependence ([place]) [F12.20] 08/07/2016  . CHOLECYSTITIS, UNSPECIFIED [K81.9] 06/12/2008  . BILIARY COLIC [id number]  . CERUMEN IMPACTION, BILATERAL [H61.20] 01/17/2008  . PHARYNGITIS, VIRAL [J02.9] 01/17/2008  . NECK PAIN [M54.2] 01/17/2008   Total Time spent with patient: 25 minutes  Past Psychiatric History: has had multiple hospitalizations in the past - 2006, 2008, 2015. Pt reports past hx of Bipolar disorder . Reports she follows up with [facility]. Denies past suicide attempts.  Past Medical History:  Past Medical History:  Diagnosis Date  . Abnormal Pap smear   . Bipolar 1 disorder ([place])   . Fibroids   . IBS (irritable bowel syndrome)     Past Surgical History:  Procedure Laterality Date  . CERVICAL BIOPSY    . CHOLECYSTECTOMY    . TUBAL LIGATION     Family History:  Family History  Problem Relation Age of Onset  . Diabetes Father   . Diabetes Paternal Grandmother   . Diabetes Maternal Grandmother   . Mental  illness Cousin    Family Psychiatric  History: Her cousin has mental illness, denies otherwise. Social History: Is separated , lives in [place] , has two kids , who are with neighbors or friends at this time due to her being in hospital, works as a Control and instrumentation engineer.  History  Alcohol Use  . Yes    Comment: occasional     History  Drug Use    Comment: CBD oil    Social History   Social History  . Marital status: Married    Spouse name: N/A  . Number of children: N/A  . Years of education: N/A   Social History Main Topics  . Smoking status: Current Every Day Smoker    Packs/day: 0.25    Years: 0.00    Types: Cigarettes  . Smokeless tobacco: Never Used  . Alcohol use Yes     Comment: occasional  . Drug use: Yes     Comment: CBD oil  . Sexual activity: Not Currently   Other Topics Concern  . None   Social History Narrative  . None   Additional Social History:    Pain Medications: denies Prescriptions: See mar Over the Counter: none History of alcohol / drug use?: No history of alcohol / drug abuse                    Sleep: restless  Appetite:  Fair  Current Medications: Current Facility-Administered Medications  Medication Dose Route Frequency Provider Last Rate Last Dose  . acetaminophen (TYLENOL) tablet 650  mg  650 mg Oral Q6H PRN Ethelene Hal, NP      . alum & mag hydroxide-simeth (MAALOX/MYLANTA) 200-200-20 MG/5ML suspension 30 mL  30 mL Oral Q4H PRN Ethelene Hal, NP      . benztropine (COGENTIN) tablet 0.5 mg  0.5 mg Oral BH-qamhs Darly Massi, MD   0.5 mg at 08/10/16 0756  . divalproex (DEPAKOTE ER) 24 hr tablet 750 mg  750 mg Oral QHS Yukie Bergeron, MD   750 mg at 08/09/16 2137  . haloperidol (HALDOL) tablet 5 mg  5 mg Oral BH-qamhs Carmaleta Youngers, MD   5 mg at 08/10/16 0756  . insulin aspart (novoLOG) injection 0-15 Units  0-15 Units Subcutaneous TID WC Ursula Alert, MD   3 Units at 08/10/16 [id number]  . insulin aspart (novoLOG)  injection 0-5 Units  0-5 Units Subcutaneous QHS Royanne Warshaw, MD      . LORazepam (ATIVAN) tablet 1 mg  1 mg Oral Q8H PRN Ursula Alert, MD   1 mg at 08/08/16 1121   Or  . LORazepam (ATIVAN) injection 1 mg  1 mg Intramuscular Q8H PRN Ursula Alert, MD      . LORazepam (ATIVAN) tablet 0.5 mg  0.5 mg Oral QHS Ursula Alert, MD   0.5 mg at 08/09/16 2137  . [START ON 08/11/2016] LORazepam (ATIVAN) tablet 0.5 mg  0.5 mg Oral QPC lunch Jenya Putz, MD      . magnesium hydroxide (MILK OF MAGNESIA) suspension 30 mL  30 mL Oral Daily PRN Ethelene Hal, NP      . metFORMIN (GLUCOPHAGE) tablet 500 mg  500 mg Oral BID WC Kerrie Buffalo, NP   500 mg at 08/10/16 0756  . traZODone (DESYREL) tablet 100 mg  100 mg Oral QHS PRN Ursula Alert, MD      . ziprasidone (GEODON) capsule 20 mg  20 mg Oral TID PRN Ursula Alert, MD   20 mg at 08/08/16 1121   Or  . ziprasidone (GEODON) injection 10 mg  10 mg Intramuscular TID PRN Ursula Alert, MD      . zolpidem (AMBIEN) tablet 10 mg  10 mg Oral QHS Ursula Alert, MD        Lab Results:  Results for orders placed or performed during the hospital encounter of 08/04/16 (from the past 48 hour(s))  Glucose, capillary     Status: Abnormal   Collection Time: 08/08/16  5:31 PM  Result Value Ref Range   Glucose-Capillary 168 (H) 65 - 99 mg/dL  Glucose, capillary     Status: Abnormal   Collection Time: 08/08/16  8:30 PM  Result Value Ref Range   Glucose-Capillary 176 (H) 65 - 99 mg/dL   Comment 1 Notify RN   Glucose, capillary     Status: Abnormal   Collection Time: 08/09/16  5:58 AM  Result Value Ref Range   Glucose-Capillary 161 (H) 65 - 99 mg/dL  Glucose, capillary     Status: Abnormal   Collection Time: 08/09/16 11:37 AM  Result Value Ref Range   Glucose-Capillary 186 (H) 65 - 99 mg/dL  Glucose, capillary     Status: Abnormal   Collection Time: 08/09/16  5:06 PM  Result Value Ref Range   Glucose-Capillary 115 (H) 65 - 99 mg/dL  Glucose,  capillary     Status: Abnormal   Collection Time: 08/09/16  8:35 PM  Result Value Ref Range   Glucose-Capillary 138 (H) 65 - 99 mg/dL  Glucose, capillary  Status: Abnormal   Collection Time: 08/10/16  5:51 AM  Result Value Ref Range   Glucose-Capillary 155 (H) 65 - 99 mg/dL   Comment 1 Notify RN   Glucose, capillary     Status: Abnormal   Collection Time: 08/10/16 12:04 PM  Result Value Ref Range   Glucose-Capillary 115 (H) 65 - 99 mg/dL    Blood Alcohol level:  Lab Results  Component Value Date   ETH <5 08/03/2016   ETH <5 [id number]    Metabolic Disorder Labs: Lab Results  Component Value Date   HGBA1C 7.6 (H) 08/06/2016   MPG 171 08/06/2016   Lab Results  Component Value Date   PROLACTIN 8.5 08/06/2016   Lab Results  Component Value Date   CHOL 162 08/06/2016   TRIG 80 08/06/2016   HDL 40 (L) 08/06/2016   CHOLHDL 4.1 08/06/2016   VLDL 16 08/06/2016   LDLCALC 106 (H) 08/06/2016    Physical Findings: AIMS: Facial and Oral Movements Muscles of Facial Expression: None, normal Lips and Perioral Area: None, normal Jaw: None, normal Tongue: None, normal,Extremity Movements Upper (arms, wrists, hands, fingers): None, normal Lower (legs, knees, ankles, toes): None, normal, Trunk Movements Neck, shoulders, hips: None, normal, Overall Severity Severity of abnormal movements (highest score from questions above): None, normal Incapacitation due to abnormal movements: None, normal Patient's awareness of abnormal movements (rate only patient's report): No Awareness, Dental Status Current problems with teeth and/or dentures?: No Does patient usually wear dentures?: No  CIWA:  CIWA-Ar Total: 1 COWS:  COWS Total Score: 2  Musculoskeletal: Strength & Muscle Tone: within normal limits Gait & Station: normal Patient leans: N/A  Psychiatric Specialty Exam: Physical Exam  Nursing note and vitals reviewed.   Review of Systems  Psychiatric/Behavioral: Positive for  hallucinations. The patient is nervous/anxious.   All other systems reviewed and are negative.   Blood pressure 127/73, pulse 92, temperature 98.4 F (36.9 C), temperature source Oral, resp. rate 16, height 5\' 11"  (1.803 m), weight 132.5 kg (292 lb), last menstrual period 08/03/2016, SpO2 100 %.Body mass index is 40.73 kg/m.  General Appearance: Guarded  Eye Contact:  Fair  Speech:  Blocked and Slow improving  Volume:  Decreased  Mood:  Anxious and Dysphoric, improving  Affect:  Congruent  Thought Process:  Goal Directed and Descriptions of Associations: Circumstantial  Orientation:  Full (Time, Place, and Person)  Thought Content:  Delusions and Paranoid Ideation, improving  Suicidal Thoughts:  No  Homicidal Thoughts:  No  Memory:  Immediate;   Fair Recent;   Fair Remote;   Fair  Judgement:  Fair  Insight:  Present  Psychomotor Activity:  Decreased  Concentration:  Concentration: Fair and Attention Span: Fair  Recall:  [employer] of Knowledge:  Fair  Language:  Fair  Akathisia:  No  Handed:  Right  AIMS (if indicated):     Assets:  Desire for Improvement  ADL's:  Intact  Cognition:  WNL  Sleep:  Number of Hours: 5     Treatment Plan Summary: Patient today is seen as depressed, with thought blocking , however is making progress , does not appear to be confused , sleep is still restless, will continue to make readjustments with medications.   Bipolar disorder, curr episode mixed, severe, with psychotic features ([place]) unstable Will continue today 08/10/16 plan as below except where it is noted.  Daily contact with patient to assess and evaluate symptoms and progress in treatment and Medication management Reviewed past  medical records,treatment plan.   For Bipolar disorder: Will continue Haldol 5 mg po bid for psychosis. Continue Cogentin 0.5 mg po bid  for EPS. Add Depakote ER 750 mg po qhs for mood sx. Depakote level on 08/11/16.  For anxiety sx: Will change Ativan  to 0.5 mg po bid , will taper down slowly.  For insomnia: Will increase Ambien to  10 mg po qhs. Trazodone  100 mg po qhs prn.  For DM ( new diagnosis) Added Metformin 500 mg po bid. Diabetic consult placed - pls see consult notes. Dietician consult for new diagnosis DM.  CBG monitoring. Provided patient with education about her diagnosis and treatment.  Will discontinue 1;1 precautions.  Will continue to make PRN medications available for anxiety/agitation.  Will continue to monitor vitals ,medication compliance and treatment side effects while patient is here.   Will monitor for medical issues as well as call consult as needed.   Reviewed labs- tsh- wnl , UA- wnl ,  urine clx - wnl.  CSW will continue working on disposition.   Patient to participate in therapeutic milieu .      Adyson Vanburen, MD 08/10/2016, 2:37 PM

## 2016-08-10 NOTE — Progress Notes (Signed)
[place] [facility] 1:1 Observation Documentation  For the first (8) hours following discontinuation of 1:1 precautions, a progress note entry by nursing staff should be documented at least every 2 hours, reflecting the patient's behavior, condition, mood, and conversation.  Use the progress notes for additional entries.  Time 1:1 discontinued:  0900  Patient's Behavior:  Patient is calm sitting in the day room.  Patient denies Si, HI and AVH.   Patient's Condition:  Patient is able to contract for safety.   Patient's Conversation:  Patient has no thought blocking and is able to stated that she feels ok just sad and misses her children.   Clarita Crane 08/10/2016, 9:06 AM

## 2016-08-10 NOTE — Progress Notes (Signed)
[place] [facility] 1:1 Observation Documentation  For the first (8) hours following discontinuation of 1:1 precautions, a progress note entry by nursing staff should be documented at least every 2 hours, reflecting the patient's behavior, condition, mood, and conversation.  Use the progress notes for additional entries.  Time 1:1 discontinued:  0900  Patient's Behavior:  Patient is resting in the bed  Patient's Condition:  No acute distress at this time  Patient's Conversation:  Unable to assess because the patient is resting  Marilynne Halsted M 08/10/2016, 3:53 PM

## 2016-08-11 LAB — GLUCOSE, CAPILLARY
Glucose-Capillary: 139 mg/dL — ABNORMAL HIGH (ref 65–99)
Glucose-Capillary: 137 mg/dL — ABNORMAL HIGH (ref 65–99)
Glucose-Capillary: 122 mg/dL — ABNORMAL HIGH (ref 65–99)
Glucose-Capillary: 116 mg/dL — ABNORMAL HIGH (ref 65–99)

## 2016-08-11 LAB — VALPROIC ACID LEVEL: Valproic Acid Lvl: 53 ug/mL (ref 50.0–100.0)

## 2016-08-11 NOTE — Progress Notes (Signed)
Recreation Therapy Notes  Date: 08/11/16 Time: 1000 Location: 500 Hall Dayroom  Group Topic: Communication, Team Building, Problem Solving  Goal Area(s) Addresses:  Patient will effectively work with peer towards shared goal.  Patient will identify skill used to make activity successful.  Patient will identify how skills used during activity can be used to reach post d/c goals.   Behavioral Response: Engaged  Intervention: STEM Activity   Activity: Metallurgist. In teams, patients were asked to build the tallest freestanding tower possible out of 15 pipe cleaners. Systematically resources were removed, for example patient ability to use both hands and patient ability to verbally communicate.    Education:Social Skills, Discharge Planning.   Education Outcome: Acknowledges education/In group clarification offered/Needs additional education.   Clinical Observations/Feedback: Pt stated her group used communication to complete the activity.  Pt expressed using communication with her support system will allow her to "be honest, communicate and take each person's feeling into account."  Pt also stated that using these skills moving forward would give her "better things to do but have to work it out, help keep things in order/scheduled and keep everybody safe."   Victorino Sparrow, LRT/CTRS       Victorino Sparrow A 08/11/2016 12:47 PM

## 2016-08-11 NOTE — Progress Notes (Signed)
Patient ID: [name], female   DOB: Apr 11, 1976, 41 y.o.   MRN: [id number] D: Client visible on the unit, but preoccupied with involuntary status, reports "been flip flopping, when I read this going back and forth about this and I know they explained it to me but I keep looking at it" Client also reports missing her children. Client looked at the date on the involuntary status and wanted to know if she would have to be hospitalized until 2/13. A: Writer provided emotional support, noting that client would be reevaluated and status changed to voluntary before discharge. Client reports she wants family (mom or sister to be a part of discharge planning. Client encouraged to pass this information on to physician and SW. Medications reviewed, administered as ordered. Staff will monitor q70min for safety. R: Client is safe on the unit, attended group.

## 2016-08-11 NOTE — Progress Notes (Signed)
[facility] Psychiatric MD Progress Note  08/11/2016 12:04 PM [name]  MRN:  [id number]    Subjective: Patient states " I am confused, I don't want to go to court." (pointing at [facility] paper). Patient is requesting to be discharged home to her husband.  Objective:[name] is awake, alert and oriented* 3. Seen standing in the hall.  Patient presents with a flat affect and thought blocking throughout this assessment. Patient is appears to be delayed and slow with responses. Patient is ruminative  with discharge and going to court.  She denies suicidal or homicidal ideation during this assessment.  Patient reports she is medication compliant and states she is tolerating medications well. States her depression 2/10.  Reports fair appetite and states she "tossed and turned" patient reports" that's normal for me" resting well.  Support, encouragement and reassurance was provided.   Principal Problem: Bipolar disorder, curr episode mixed, severe, with psychotic features ([place]) Diagnosis:   Patient Active Problem List   Diagnosis Date Noted  . Diabetes mellitus ([place]) [E11.9] 08/10/2016  . Bipolar disorder, curr episode mixed, severe, with psychotic features ([place]) [F31.64] 08/07/2016  . Cannabis use disorder, moderate, dependence ([place]) [F12.20] 08/07/2016  . CHOLECYSTITIS, UNSPECIFIED [K81.9] 06/12/2008  . BILIARY COLIC [id number]  . CERUMEN IMPACTION, BILATERAL [H61.20] 01/17/2008  . PHARYNGITIS, VIRAL [J02.9] 01/17/2008  . NECK PAIN [M54.2] 01/17/2008   Total Time spent with patient: 25 minutes  Past Psychiatric History: has had multiple hospitalizations in the past - 2006, 2008, 2015. Pt reports past hx of Bipolar disorder . Reports she follows up with [facility]. Denies past suicide attempts.  Past Medical History:  Past Medical History:  Diagnosis Date  . Abnormal Pap smear   . Bipolar 1 disorder ([place])   . Fibroids   . IBS (irritable bowel syndrome)     Past Surgical History:  Procedure  Laterality Date  . CERVICAL BIOPSY    . CHOLECYSTECTOMY    . TUBAL LIGATION     Family History:  Family History  Problem Relation Age of Onset  . Diabetes Father   . Diabetes Paternal Grandmother   . Diabetes Maternal Grandmother   . Mental illness Cousin    Family Psychiatric  History: Her cousin has mental illness, denies otherwise. Social History: Is separated , lives in [place] , has two kids , who are with neighbors or friends at this time due to her being in hospital, works as a Control and instrumentation engineer.  History  Alcohol Use  . Yes    Comment: occasional     History  Drug Use    Comment: CBD oil    Social History   Social History  . Marital status: Married    Spouse name: N/A  . Number of children: N/A  . Years of education: N/A   Social History Main Topics  . Smoking status: Current Every Day Smoker    Packs/day: 0.25    Years: 0.00    Types: Cigarettes  . Smokeless tobacco: Never Used  . Alcohol use Yes     Comment: occasional  . Drug use: Yes     Comment: CBD oil  . Sexual activity: Not Currently   Other Topics Concern  . None   Social History Narrative  . None   Additional Social History:    Pain Medications: denies Prescriptions: See mar Over the Counter: none History of alcohol / drug use?: No history of alcohol / drug abuse  Sleep: Fair  Appetite:  Fair  Current Medications: Current Facility-Administered Medications  Medication Dose Route Frequency Provider Last Rate Last Dose  . acetaminophen (TYLENOL) tablet 650 mg  650 mg Oral Q6H PRN Ethelene Hal, NP      . alum & mag hydroxide-simeth (MAALOX/MYLANTA) 200-200-20 MG/5ML suspension 30 mL  30 mL Oral Q4H PRN Ethelene Hal, NP      . benztropine (COGENTIN) tablet 0.5 mg  0.5 mg Oral BH-qamhs Saramma Eappen, MD   0.5 mg at 08/11/16 0756  . divalproex (DEPAKOTE ER) 24 hr tablet 750 mg  750 mg Oral QHS Saramma Eappen, MD   750 mg at 08/10/16 2139  .  haloperidol (HALDOL) tablet 5 mg  5 mg Oral BH-qamhs Saramma Eappen, MD   5 mg at 08/11/16 0756  . insulin aspart (novoLOG) injection 0-15 Units  0-15 Units Subcutaneous TID WC Ursula Alert, MD   2 Units at 08/11/16 [id number]  . insulin aspart (novoLOG) injection 0-5 Units  0-5 Units Subcutaneous QHS Saramma Eappen, MD      . LORazepam (ATIVAN) tablet 1 mg  1 mg Oral Q8H PRN Ursula Alert, MD   1 mg at 08/08/16 1121   Or  . LORazepam (ATIVAN) injection 1 mg  1 mg Intramuscular Q8H PRN Ursula Alert, MD      . LORazepam (ATIVAN) tablet 0.5 mg  0.5 mg Oral QHS Ursula Alert, MD   0.5 mg at 08/10/16 2139  . LORazepam (ATIVAN) tablet 0.5 mg  0.5 mg Oral QPC lunch Saramma Eappen, MD      . magnesium hydroxide (MILK OF MAGNESIA) suspension 30 mL  30 mL Oral Daily PRN Ethelene Hal, NP      . metFORMIN (GLUCOPHAGE) tablet 500 mg  500 mg Oral BID WC Kerrie Buffalo, NP   500 mg at 08/11/16 0756  . traZODone (DESYREL) tablet 100 mg  100 mg Oral QHS PRN Ursula Alert, MD      . ziprasidone (GEODON) capsule 20 mg  20 mg Oral TID PRN Ursula Alert, MD   20 mg at 08/08/16 1121   Or  . ziprasidone (GEODON) injection 10 mg  10 mg Intramuscular TID PRN Ursula Alert, MD      . zolpidem (AMBIEN) tablet 10 mg  10 mg Oral QHS Ursula Alert, MD   10 mg at 08/10/16 2139    Lab Results:  Results for orders placed or performed during the hospital encounter of 08/04/16 (from the past 48 hour(s))  Glucose, capillary     Status: Abnormal   Collection Time: 08/09/16  5:06 PM  Result Value Ref Range   Glucose-Capillary 115 (H) 65 - 99 mg/dL  Glucose, capillary     Status: Abnormal   Collection Time: 08/09/16  8:35 PM  Result Value Ref Range   Glucose-Capillary 138 (H) 65 - 99 mg/dL  Glucose, capillary     Status: Abnormal   Collection Time: 08/10/16  5:51 AM  Result Value Ref Range   Glucose-Capillary 155 (H) 65 - 99 mg/dL   Comment 1 Notify RN   Glucose, capillary     Status: Abnormal   Collection  Time: 08/10/16 12:04 PM  Result Value Ref Range   Glucose-Capillary 115 (H) 65 - 99 mg/dL  Glucose, capillary     Status: Abnormal   Collection Time: 08/10/16  5:10 PM  Result Value Ref Range   Glucose-Capillary 131 (H) 65 - 99 mg/dL   Comment 1 Notify RN  Comment 2 Document in Chart   Glucose, capillary     Status: Abnormal   Collection Time: 08/10/16  8:37 PM  Result Value Ref Range   Glucose-Capillary 154 (H) 65 - 99 mg/dL  Glucose, capillary     Status: Abnormal   Collection Time: 08/11/16  6:07 AM  Result Value Ref Range   Glucose-Capillary 139 (H) 65 - 99 mg/dL  Valproic acid level     Status: None   Collection Time: 08/11/16  6:11 AM  Result Value Ref Range   Valproic Acid Lvl 53 50.0 - 100.0 ug/mL    Comment: Performed at [facility], [place] [address] [place], [place] [address]  Glucose, capillary     Status: Abnormal   Collection Time: 08/11/16 12:03 PM  Result Value Ref Range   Glucose-Capillary 137 (H) 65 - 99 mg/dL   Comment 1 Notify RN    Comment 2 Document in Chart     Blood Alcohol level:  Lab Results  Component Value Date   ETH <5 08/03/2016   ETH <5 [id number]    Metabolic Disorder Labs: Lab Results  Component Value Date   HGBA1C 7.6 (H) 08/06/2016   MPG 171 08/06/2016   Lab Results  Component Value Date   PROLACTIN 8.5 08/06/2016   Lab Results  Component Value Date   CHOL 162 08/06/2016   TRIG 80 08/06/2016   HDL 40 (L) 08/06/2016   CHOLHDL 4.1 08/06/2016   VLDL 16 08/06/2016   LDLCALC 106 (H) 08/06/2016    Physical Findings: AIMS: Facial and Oral Movements Muscles of Facial Expression: None, normal Lips and Perioral Area: None, normal Jaw: None, normal Tongue: None, normal,Extremity Movements Upper (arms, wrists, hands, fingers): None, normal Lower (legs, knees, ankles, toes): None, normal, Trunk Movements Neck, shoulders, hips: None, normal, Overall Severity Severity of abnormal movements (highest score from  questions above): None, normal Incapacitation due to abnormal movements: None, normal Patient's awareness of abnormal movements (rate only patient's report): No Awareness, Dental Status Current problems with teeth and/or dentures?: No Does patient usually wear dentures?: No  CIWA:  CIWA-Ar Total: 1 COWS:  COWS Total Score: 2  Musculoskeletal: Strength & Muscle Tone: within normal limits Gait & Station: normal Patient leans: N/A  Psychiatric Specialty Exam: Physical Exam  Nursing note and vitals reviewed. Constitutional: She is oriented to person, place, and time. She appears well-developed.  Cardiovascular: Normal rate.   Neurological: She is oriented to person, place, and time.  Skin: Skin is warm and dry.    Review of Systems  Psychiatric/Behavioral: Positive for depression (mild) and hallucinations. The patient is nervous/anxious.   All other systems reviewed and are negative.   Blood pressure 139/81, pulse 93, temperature 98.5 F (36.9 C), resp. rate 20, height 5\' 11"  (1.803 m), weight 132.5 kg (292 lb), last menstrual period 08/03/2016, SpO2 100 %.Body mass index is 40.73 kg/m.  General Appearance: Casual and Guarded  Eye Contact:  Fair  Speech:  Blocked and Slow  Volume:  Normal  Mood:  Anxious, Depressed and Dysphoric  Affect:  Congruent  Thought Process:  Irrelevant and Descriptions of Associations: Circumstantial  Orientation:  Full (Time, Place, and Person)  Thought Content:  Delusions, Paranoid Ideation and Rumination  Suicidal Thoughts:  No  Homicidal Thoughts:  No  Memory:  Immediate;   Fair Recent;   Fair Remote;   Fair  Judgement:  Impaired  Insight:  Shallow  Psychomotor Activity:  Decreased- improving per staffing notes  Concentration:  Concentration: Fair and Attention Span: Fair  Recall:  [employer] of Knowledge:  Fair  Language:  Fair  Akathisia:  No  Handed:  Right  AIMS (if indicated):     Assets:  Communication Skills Desire for  Improvement Intimacy Social Support  ADL's:  Intact  Cognition:  WNL  Sleep:  Number of Hours: 6    I agree with current treatment plan on 08/11/2016, Patient seen face-to-face for psychiatric evaluation follow-up, chart reviewed. Reviewed the information documented and agree with the treatment plan.  Treatment Plan Summary:   Bipolar disorder, curr episode mixed, severe, with psychotic features ([place]) unstable  Will continue today 08/11/16 plan as below except where it is noted.  Daily contact with patient to assess and evaluate symptoms and progress in treatment and Medication management  For Bipolar disorder: Will continue Haldol 5 mg po bid for psychosis. Continue Cogentin 0.5 mg po bid  for EPS. Continue  Depakote ER 750 mg po qhs for mood sx.  Depakote level on 08/11/16. Results =53 WNL   For anxiety sx: Will change Ativan to 0.5 mg po tid , will taper down slowly.  For insomnia: Will continue Ambien 5 mg po qhs.  Continue Trazodone  100 mg po qhs prn.  For elevated Hba1c:  CBG monitoring.  Will continue to make PRN medications available for anxiety/agitation. Will continue to monitor vitals ,medication compliance and treatment side effects while patient is here.  Will monitor for medical issues as well as call consult as needed.   Reviewed labs- tsh- wnl , UA- wnl ,  urine clx - wnl.  CSW will continue working on disposition.   Patient to participate in therapeutic milieu .   Derrill Center, NP 08/11/2016, 12:04 PM

## 2016-08-11 NOTE — Progress Notes (Signed)
NUTRITION NOTE  Consult for DM education. Pt with new dx of DM this admission with HgbA1c: 7.5%. From review of notes, pt is appropriate for education at this time but RD unable to travel to [facility] today to provide education. Will ensure that education is provided to pt prior to d/c.    Jarome Matin, MS, RD, LDN, [facility] Inpatient Clinical Dietitian Pager # [phone] After hours/weekend pager # [phone]

## 2016-08-11 NOTE — Progress Notes (Signed)
Adult Psychoeducational Group Note  Date:  08/11/2016 Time:  9:01 PM  Group Topic/Focus:  Wrap-Up Group:   The focus of this group is to help patients review their daily goal of treatment and discuss progress on daily workbooks.  Participation Level:  Active  Participation Quality:  Appropriate  Affect:  Appropriate  Cognitive:  Appropriate  Insight: Appropriate  Engagement in Group:  Engaged  Modes of Intervention:  Discussion  Additional Comments:  The patient expressed that she went to groups.The patient also said that she rates today a 8.  Nash Shearer 08/11/2016, 9:01 PM

## 2016-08-12 DIAGNOSIS — R443 Hallucinations, unspecified: Secondary | ICD-10-CM

## 2016-08-12 LAB — GLUCOSE, CAPILLARY
Glucose-Capillary: 127 mg/dL — ABNORMAL HIGH (ref 65–99)
Glucose-Capillary: 103 mg/dL — ABNORMAL HIGH (ref 65–99)
Glucose-Capillary: 117 mg/dL — ABNORMAL HIGH (ref 65–99)
Glucose-Capillary: 178 mg/dL — ABNORMAL HIGH (ref 65–99)

## 2016-08-12 NOTE — BHH Counselor (Signed)
Adult Psychoeducational Group Note  Date:  08/12/2016 Time:  8:15 PM  Group Topic/Focus:  Wrap-Up Group:   The focus of this group is to help patients review their daily goal of treatment and discuss progress on daily workbooks.  Participation Level:  Active  Participation Quality:  Appropriate  Affect:  Appropriate  Cognitive:  Alert  Insight: Appropriate  Engagement in Group:  Engaged  Modes of Intervention:  Discussion and Education  Additional Comments:  Patient reported working on identifying and using coping skills to remain calm throughout the day.  Leroy Sea N 08/12/2016, 8:56 PM

## 2016-08-12 NOTE — Progress Notes (Signed)
Patient ID: [name], female   DOB: 1976-04-12, 41 y.o.   MRN: [id number]   D: Pt has been very anxious today on the unit, she was very concerned about her discharge. Pt reported that she wanted to speak to a SW regarding her concerns, because she did not want there to be miss communication. Pt did meet with the SW to address her concerns. Pt reported that her deepression was a 1, her hopelessness was a 0, and her anxiety was a 2. Pt reported that her goal for today was to work on Radiographer, therapeutic and organization. Pt reported being negative SI/HI, no AH/VH noted. A: 15 min checks continued for patient safety. R: Pt safety maintained.

## 2016-08-12 NOTE — Progress Notes (Signed)
D: Pt denies SI/HI/AVH. Pt is pleasant and cooperative. Pt goal for today is to work on plans for discharge. A: Pt was offered support and encouragement. Pt was given scheduled medications. Pt was encourage to attend groups. Q 15 minute checks were done for safety.  R:Pt attends groups and interacts well with peers and staff. Pt is taking medication. Pt has no complaints.Pt receptive to treatment and safety maintained on unit.

## 2016-08-12 NOTE — BHH Group Notes (Signed)
[place] Group Notes:  (Nursing/MHT/Case Management/Adjunct)  Date:  08/12/2016  Time:  12:04 PM  Type of Therapy:  Psychoeducational Skills  Participation Level:  Active  Participation Quality:  Appropriate  Affect:  Appropriate  Cognitive:  Appropriate  Insight:  Appropriate  Engagement in Group:  Engaged  Modes of Intervention:  Discussion  Summary of Progress/Problems: Pt did attend self inventory group, pt reported that she was negative SI/HI, no AH/VH noted. Pt rated her depression as a 0, and her helplessness/hopelessness as a 0.     Pt reported no issues or concerns.   Benancio Deeds Shanta 08/12/2016, 12:04 PM

## 2016-08-12 NOTE — Progress Notes (Signed)
[facility] MD Progress Note  08/12/2016 10:46 AM [name]  MRN:  [id number] Subjective:  Sometime is still confused.  But am sleeping better.  Objective; Patient seen and chart reviewed.  Patient is slowly improving.  However she still at times confused and difficult to organize her thinking.  She sleeping better.  She denies any irritability, anger or any severe mood swing but remains isolated, withdrawn and limited to participate in group. She continued to endorse hallucination and difficulty trusting people. Her appetite is fair.  She has no tremors or shakes.  Principal Problem: Bipolar disorder, curr episode mixed, severe, with psychotic features ([place]) Diagnosis:   Patient Active Problem List   Diagnosis Date Noted  . Diabetes mellitus ([place]) [E11.9] 08/10/2016  . Bipolar disorder, curr episode mixed, severe, with psychotic features ([place]) [F31.64] 08/07/2016  . Cannabis use disorder, moderate, dependence ([place]) [F12.20] 08/07/2016  . CHOLECYSTITIS, UNSPECIFIED [K81.9] 06/12/2008  . BILIARY COLIC [id number]  . CERUMEN IMPACTION, BILATERAL [H61.20] 01/17/2008  . PHARYNGITIS, VIRAL [J02.9] 01/17/2008  . NECK PAIN [M54.2] 01/17/2008   Total Time spent with patient: 20 minutes  Past Psychiatric History: Reviewed  Past Medical History:  Past Medical History:  Diagnosis Date  . Abnormal Pap smear   . Bipolar 1 disorder ([place])   . Fibroids   . IBS (irritable bowel syndrome)     Past Surgical History:  Procedure Laterality Date  . CERVICAL BIOPSY    . CHOLECYSTECTOMY    . TUBAL LIGATION     Family History:  Family History  Problem Relation Age of Onset  . Diabetes Father   . Diabetes Paternal Grandmother   . Diabetes Maternal Grandmother   . Mental illness Cousin    Family Psychiatric  History: .reviewed. Social History:  History  Alcohol Use  . Yes    Comment: occasional     History  Drug Use    Comment: CBD oil    Social History   Social History  .  Marital status: Married    Spouse name: N/A  . Number of children: N/A  . Years of education: N/A   Social History Main Topics  . Smoking status: Current Every Day Smoker    Packs/day: 0.25    Years: 0.00    Types: Cigarettes  . Smokeless tobacco: Never Used  . Alcohol use Yes     Comment: occasional  . Drug use: Yes     Comment: CBD oil  . Sexual activity: Not Currently   Other Topics Concern  . None   Social History Narrative  . None   Additional Social History:    Pain Medications: denies Prescriptions: See mar Over the Counter: none History of alcohol / drug use?: No history of alcohol / drug abuse                    Sleep: Fair  Appetite:  Fair  Current Medications: Current Facility-Administered Medications  Medication Dose Route Frequency Provider Last Rate Last Dose  . acetaminophen (TYLENOL) tablet 650 mg  650 mg Oral Q6H PRN Ethelene Hal, NP      . alum & mag hydroxide-simeth (MAALOX/MYLANTA) 200-200-20 MG/5ML suspension 30 mL  30 mL Oral Q4H PRN Ethelene Hal, NP      . benztropine (COGENTIN) tablet 0.5 mg  0.5 mg Oral BH-qamhs Saramma Eappen, MD   0.5 mg at 08/12/16 0754  . divalproex (DEPAKOTE ER) 24 hr tablet 750 mg  750 mg Oral QHS Saramma  Eappen, MD   750 mg at 08/11/16 2117  . haloperidol (HALDOL) tablet 5 mg  5 mg Oral BH-qamhs Saramma Eappen, MD   5 mg at 08/12/16 0754  . insulin aspart (novoLOG) injection 0-15 Units  0-15 Units Subcutaneous TID WC Ursula Alert, MD   2 Units at 08/12/16 0618  . insulin aspart (novoLOG) injection 0-5 Units  0-5 Units Subcutaneous QHS Saramma Eappen, MD      . LORazepam (ATIVAN) tablet 1 mg  1 mg Oral Q8H PRN Ursula Alert, MD   1 mg at 08/08/16 1121   Or  . LORazepam (ATIVAN) injection 1 mg  1 mg Intramuscular Q8H PRN Ursula Alert, MD      . LORazepam (ATIVAN) tablet 0.5 mg  0.5 mg Oral QHS Saramma Eappen, MD   0.5 mg at 08/11/16 2117  . LORazepam (ATIVAN) tablet 0.5 mg  0.5 mg Oral QPC  lunch Saramma Eappen, MD   0.5 mg at 08/11/16 1425  . magnesium hydroxide (MILK OF MAGNESIA) suspension 30 mL  30 mL Oral Daily PRN Ethelene Hal, NP      . metFORMIN (GLUCOPHAGE) tablet 500 mg  500 mg Oral BID WC Kerrie Buffalo, NP   500 mg at 08/12/16 0754  . traZODone (DESYREL) tablet 100 mg  100 mg Oral QHS PRN Ursula Alert, MD      . ziprasidone (GEODON) capsule 20 mg  20 mg Oral TID PRN Ursula Alert, MD   20 mg at 08/08/16 1121   Or  . ziprasidone (GEODON) injection 10 mg  10 mg Intramuscular TID PRN Ursula Alert, MD      . zolpidem (AMBIEN) tablet 10 mg  10 mg Oral QHS Ursula Alert, MD   10 mg at 08/11/16 2117    Lab Results:  Results for orders placed or performed during the hospital encounter of 08/04/16 (from the past 48 hour(s))  Glucose, capillary     Status: Abnormal   Collection Time: 08/10/16 12:04 PM  Result Value Ref Range   Glucose-Capillary 115 (H) 65 - 99 mg/dL  Glucose, capillary     Status: Abnormal   Collection Time: 08/10/16  5:10 PM  Result Value Ref Range   Glucose-Capillary 131 (H) 65 - 99 mg/dL   Comment 1 Notify RN    Comment 2 Document in Chart   Glucose, capillary     Status: Abnormal   Collection Time: 08/10/16  8:37 PM  Result Value Ref Range   Glucose-Capillary 154 (H) 65 - 99 mg/dL  Glucose, capillary     Status: Abnormal   Collection Time: 08/11/16  6:07 AM  Result Value Ref Range   Glucose-Capillary 139 (H) 65 - 99 mg/dL  Valproic acid level     Status: None   Collection Time: 08/11/16  6:11 AM  Result Value Ref Range   Valproic Acid Lvl 53 50.0 - 100.0 ug/mL    Comment: Performed at [facility], [place] [address] [place], [place] [address]  Glucose, capillary     Status: Abnormal   Collection Time: 08/11/16 12:03 PM  Result Value Ref Range   Glucose-Capillary 137 (H) 65 - 99 mg/dL   Comment 1 Notify RN    Comment 2 Document in Chart   Glucose, capillary     Status: Abnormal   Collection Time: 08/11/16   4:52 PM  Result Value Ref Range   Glucose-Capillary 122 (H) 65 - 99 mg/dL   Comment 1 Notify RN    Comment  2 Document in Chart   Glucose, capillary     Status: Abnormal   Collection Time: 08/11/16  8:51 PM  Result Value Ref Range   Glucose-Capillary 116 (H) 65 - 99 mg/dL  Glucose, capillary     Status: Abnormal   Collection Time: 08/12/16  5:42 AM  Result Value Ref Range   Glucose-Capillary 127 (H) 65 - 99 mg/dL    Blood Alcohol level:  Lab Results  Component Value Date   ETH <5 08/03/2016   ETH <5 [id number]    Metabolic Disorder Labs: Lab Results  Component Value Date   HGBA1C 7.6 (H) 08/06/2016   MPG 171 08/06/2016   Lab Results  Component Value Date   PROLACTIN 8.5 08/06/2016   Lab Results  Component Value Date   CHOL 162 08/06/2016   TRIG 80 08/06/2016   HDL 40 (L) 08/06/2016   CHOLHDL 4.1 08/06/2016   VLDL 16 08/06/2016   LDLCALC 106 (H) 08/06/2016    Physical Findings: AIMS: Facial and Oral Movements Muscles of Facial Expression: None, normal Lips and Perioral Area: None, normal Jaw: None, normal Tongue: None, normal,Extremity Movements Upper (arms, wrists, hands, fingers): None, normal Lower (legs, knees, ankles, toes): None, normal, Trunk Movements Neck, shoulders, hips: None, normal, Overall Severity Severity of abnormal movements (highest score from questions above): None, normal Incapacitation due to abnormal movements: None, normal Patient's awareness of abnormal movements (rate only patient's report): No Awareness, Dental Status Current problems with teeth and/or dentures?: No Does patient usually wear dentures?: No  CIWA:  CIWA-Ar Total: 1 COWS:  COWS Total Score: 2  Musculoskeletal: Strength & Muscle Tone: within normal limits Gait & Station: normal Patient leans: N/A  Psychiatric Specialty Exam: Physical Exam  Review of Systems  Psychiatric/Behavioral: Positive for hallucinations.    Blood pressure 125/73, pulse 88, temperature  97.8 F (36.6 C), resp. rate 20, height 5\' 11"  (1.803 m), weight 132.5 kg (292 lb), last menstrual period 08/03/2016, SpO2 100 %.Body mass index is 40.73 kg/m.  General Appearance: Guarded  Eye Contact:  Fair  Speech:  Slow  Volume:  Decreased  Mood:  Anxious and Depressed  Affect:  Flat  Thought Process:  Descriptions of Associations: Circumstantial  Orientation:  Full (Time, Place, and Person)  Thought Content:  Hallucinations: Auditory, Paranoid Ideation and Rumination  Suicidal Thoughts:  No  Homicidal Thoughts:  No  Memory:  Immediate;   Fair Recent;   Fair Remote;   Fair  Judgement:  Fair  Insight:  Fair  Psychomotor Activity:  Decreased  Concentration:  Concentration: Fair and Attention Span: Fair  Recall:  [employer] of Knowledge:  Fair  Language:  Good  Akathisia:  No  Handed:  Right  AIMS (if indicated):     Assets:  Desire for Improvement Housing  ADL's:  Intact  Cognition:  WNL  Sleep:  Number of Hours: 6     Treatment Plan Summary: patient continued to exhibit symptoms of paranoia and having hallucination but slowly and gradually improving.  Continue Haldol 5 mg twice a day to help psychosis, Continue Cogentin 0.5 mg twice a day to help EPS, Continue Depakote 750 mg at bedtime.  Last Depakote level 53. Patient has no tremors or shakes.  Continue Ativan 0.5 mg and trazodone and Ambien for insomnia. Encouraged to parts of it in group milieu therapy. Social worker to continue on disposition.   Dominico Rod T., MD 08/12/2016, 10:46 AM

## 2016-08-12 NOTE — [facility] Group Notes (Signed)
Adult Therapy Group Note (Clinical Social Work)  Date:  08/12/2016 Time: 11:15AM-12:00PM  Group Topic/Focus: Healthy Coping Techniques  The focus of this group was to help patients identify healthy coping techniques they use in their lives at home, as well as healthy coping skills they can use during this admission.  Each person explained their techniques to the rest of the group and told how it helps them.  At the end of the group, a 5-minute guided meditation was done, and patient reactions were overwhelming positive, as they stated they felt "relaxed," "like a natural high," "wonderful."  They expressed an interest in using meditation at discharge.  Participation Level:  Active  Participation Quality:  Appropriate, Attentive and Sharing  Affect:  Blunted  Cognitive:  Appropriate  Insight: Improving  Engagement in Group:  Engaged  Modes of Intervention:  Activity, Discussion and Exploration  Additional Comments:  Pt stated she likes to study various topics such as internet marketing, black history, any type of history and likes to write in order to handle issues in her life.  She also uses music.  She reacted particularly well to the meditation.  Berlin Hun Grossman-Orr 08/12/2016, 12:24 PM

## 2016-08-13 LAB — GLUCOSE, CAPILLARY
Glucose-Capillary: 157 mg/dL — ABNORMAL HIGH (ref 65–99)
Glucose-Capillary: 100 mg/dL — ABNORMAL HIGH (ref 65–99)
Glucose-Capillary: 150 mg/dL — ABNORMAL HIGH (ref 65–99)
Glucose-Capillary: 120 mg/dL — ABNORMAL HIGH (ref 65–99)

## 2016-08-13 NOTE — [facility] Group Notes (Signed)
[facility] Group Notes:  (Clinical Social Work)  08/13/2016  11:00AM-12:00PM  Summary of Progress/Problems:  The main focus of today's process group was to listen to a variety of genres of music and to identify that different types of music provoke different responses.  The patient then was able to identify personally what was soothing for them, as well as energizing, as well as how patient can personally use this knowledge in sleep habits, with depression, and with other symptoms.  The patient expressed at the beginning of group the overall feeling of low anxiety about going home, and at the end of group said her anxiety was gone.  Type of Therapy:  Music Therapy   Participation Level:  Active  Participation Quality:  Attentive and Sharing  Affect:  Blunted  Cognitive:  Oriented  Insight:  Improving  Engagement in Therapy:  Engaged  Modes of Intervention:   Activity, Exploration  Selmer Dominion, LCSW 08/13/2016

## 2016-08-13 NOTE — Progress Notes (Signed)
D: Pt denies SI/HI/AVH. Pt is pleasant and cooperative. Pt continues to have hard time processing information, but appears to be a lot faster than when she came in.   A: Pt was offered support and encouragement. Pt was given scheduled medications. Pt was encourage to attend groups. Q 15 minute checks were done for safety.   R:Pt attends groups and interacts well with peers and staff. Pt is taking medication. Pt has no complaints.Pt receptive to treatment and safety maintained on unit.

## 2016-08-13 NOTE — Progress Notes (Signed)
[facility] MD Progress Note  08/13/2016 1:31 PM [name]  MRN:  [id number] Subjective:  I'm feeling better today.  I slept last night well.  Objective; Patient seen and chart reviewed.  Patient continues to improve slowly and gradually.  She admitted her thinking is getting more clear and organized.  Though sometimes she still have thought blocking and confused but overall she seemed much improvement.  She is going to groups and try to participate .  Sometimes she still feels isolated, withdrawn and very anxious.  She admitted missing HER-2 children who are 8 and 65 years old.  Patient denies any tremors shakes or any EPS.  She admitted having hallucination but they're less intense and less frequent.  Principal Problem: Bipolar disorder, curr episode mixed, severe, with psychotic features ([place]) Diagnosis:   Patient Active Problem List   Diagnosis Date Noted  . Diabetes mellitus ([place]) [E11.9] 08/10/2016  . Bipolar disorder, curr episode mixed, severe, with psychotic features ([place]) [F31.64] 08/07/2016  . Cannabis use disorder, moderate, dependence ([place]) [F12.20] 08/07/2016  . CHOLECYSTITIS, UNSPECIFIED [K81.9] 06/12/2008  . BILIARY COLIC [J24.26] 83/41/9622  . CERUMEN IMPACTION, BILATERAL [H61.20] 01/17/2008  . PHARYNGITIS, VIRAL [J02.9] 01/17/2008  . NECK PAIN [M54.2] 01/17/2008   Total Time spent with patient: 20 minutes  Past Psychiatric History: Reviewed  Past Medical History:  Past Medical History:  Diagnosis Date  . Abnormal Pap smear   . Bipolar 1 disorder ([place])   . Fibroids   . IBS (irritable bowel syndrome)     Past Surgical History:  Procedure Laterality Date  . CERVICAL BIOPSY    . CHOLECYSTECTOMY    . TUBAL LIGATION     Family History:  Family History  Problem Relation Age of Onset  . Diabetes Father   . Diabetes Paternal Grandmother   . Diabetes Maternal Grandmother   . Mental illness Cousin    Family Psychiatric  History: .reviewed. Social History:  History   Alcohol Use  . Yes    Comment: occasional     History  Drug Use    Comment: CBD oil    Social History   Social History  . Marital status: Married    Spouse name: N/A  . Number of children: N/A  . Years of education: N/A   Social History Main Topics  . Smoking status: Current Every Day Smoker    Packs/day: 0.25    Years: 0.00    Types: Cigarettes  . Smokeless tobacco: Never Used  . Alcohol use Yes     Comment: occasional  . Drug use: Yes     Comment: CBD oil  . Sexual activity: Not Currently   Other Topics Concern  . None   Social History Narrative  . None   Additional Social History:    Pain Medications: denies Prescriptions: See mar Over the Counter: none History of alcohol / drug use?: No history of alcohol / drug abuse                    Sleep: Fair  Appetite:  Fair  Current Medications: Current Facility-Administered Medications  Medication Dose Route Frequency Provider Last Rate Last Dose  . acetaminophen (TYLENOL) tablet 650 mg  650 mg Oral Q6H PRN Ethelene Hal, NP      . alum & mag hydroxide-simeth (MAALOX/MYLANTA) 200-200-20 MG/5ML suspension 30 mL  30 mL Oral Q4H PRN Ethelene Hal, NP      . benztropine (COGENTIN) tablet 0.5 mg  0.5 mg  Oral BH-qamhs Ursula Alert, MD   0.5 mg at 08/13/16 0830  . divalproex (DEPAKOTE ER) 24 hr tablet 750 mg  750 mg Oral QHS Saramma Eappen, MD   750 mg at 08/12/16 2112  . haloperidol (HALDOL) tablet 5 mg  5 mg Oral BH-qamhs Saramma Eappen, MD   5 mg at 08/13/16 0830  . insulin aspart (novoLOG) injection 0-15 Units  0-15 Units Subcutaneous TID WC Ursula Alert, MD   3 Units at 08/13/16 0717  . insulin aspart (novoLOG) injection 0-5 Units  0-5 Units Subcutaneous QHS Saramma Eappen, MD      . LORazepam (ATIVAN) tablet 1 mg  1 mg Oral Q8H PRN Ursula Alert, MD   1 mg at 08/08/16 1121   Or  . LORazepam (ATIVAN) injection 1 mg  1 mg Intramuscular Q8H PRN Ursula Alert, MD      . LORazepam  (ATIVAN) tablet 0.5 mg  0.5 mg Oral QHS Saramma Eappen, MD   0.5 mg at 08/12/16 2112  . LORazepam (ATIVAN) tablet 0.5 mg  0.5 mg Oral QPC lunch Ursula Alert, MD   0.5 mg at 08/13/16 1302  . magnesium hydroxide (MILK OF MAGNESIA) suspension 30 mL  30 mL Oral Daily PRN Ethelene Hal, NP      . metFORMIN (GLUCOPHAGE) tablet 500 mg  500 mg Oral BID WC Kerrie Buffalo, NP   500 mg at 08/13/16 0830  . traZODone (DESYREL) tablet 100 mg  100 mg Oral QHS PRN Ursula Alert, MD      . ziprasidone (GEODON) capsule 20 mg  20 mg Oral TID PRN Ursula Alert, MD   20 mg at 08/08/16 1121   Or  . ziprasidone (GEODON) injection 10 mg  10 mg Intramuscular TID PRN Ursula Alert, MD      . zolpidem (AMBIEN) tablet 10 mg  10 mg Oral QHS Ursula Alert, MD   10 mg at 08/12/16 2112    Lab Results:  Results for orders placed or performed during the hospital encounter of 08/04/16 (from the past 48 hour(s))  Glucose, capillary     Status: Abnormal   Collection Time: 08/11/16  4:52 PM  Result Value Ref Range   Glucose-Capillary 122 (H) 65 - 99 mg/dL   Comment 1 Notify RN    Comment 2 Document in Chart   Glucose, capillary     Status: Abnormal   Collection Time: 08/11/16  8:51 PM  Result Value Ref Range   Glucose-Capillary 116 (H) 65 - 99 mg/dL  Glucose, capillary     Status: Abnormal   Collection Time: 08/12/16  5:42 AM  Result Value Ref Range   Glucose-Capillary 127 (H) 65 - 99 mg/dL  Glucose, capillary     Status: Abnormal   Collection Time: 08/12/16 11:50 AM  Result Value Ref Range   Glucose-Capillary 103 (H) 65 - 99 mg/dL   Comment 1 Notify RN    Comment 2 Document in Chart   Glucose, capillary     Status: Abnormal   Collection Time: 08/12/16  5:03 PM  Result Value Ref Range   Glucose-Capillary 117 (H) 65 - 99 mg/dL  Glucose, capillary     Status: Abnormal   Collection Time: 08/12/16  8:34 PM  Result Value Ref Range   Glucose-Capillary 178 (H) 65 - 99 mg/dL  Glucose, capillary     Status:  Abnormal   Collection Time: 08/13/16  6:30 AM  Result Value Ref Range   Glucose-Capillary 157 (H) 65 - 99 mg/dL  Glucose, capillary     Status: Abnormal   Collection Time: 08/13/16 11:34 AM  Result Value Ref Range   Glucose-Capillary 100 (H) 65 - 99 mg/dL    Blood Alcohol level:  Lab Results  Component Value Date   ETH <5 08/03/2016   ETH <5 11/94/1740    Metabolic Disorder Labs: Lab Results  Component Value Date   HGBA1C 7.6 (H) 08/06/2016   MPG 171 08/06/2016   Lab Results  Component Value Date   PROLACTIN 8.5 08/06/2016   Lab Results  Component Value Date   CHOL 162 08/06/2016   TRIG 80 08/06/2016   HDL 40 (L) 08/06/2016   CHOLHDL 4.1 08/06/2016   VLDL 16 08/06/2016   LDLCALC 106 (H) 08/06/2016    Physical Findings: AIMS: Facial and Oral Movements Muscles of Facial Expression: None, normal Lips and Perioral Area: None, normal Jaw: None, normal Tongue: None, normal,Extremity Movements Upper (arms, wrists, hands, fingers): None, normal Lower (legs, knees, ankles, toes): None, normal, Trunk Movements Neck, shoulders, hips: None, normal, Overall Severity Severity of abnormal movements (highest score from questions above): None, normal Incapacitation due to abnormal movements: None, normal Patient's awareness of abnormal movements (rate only patient's report): No Awareness, Dental Status Current problems with teeth and/or dentures?: No Does patient usually wear dentures?: No  CIWA:  CIWA-Ar Total: 1 COWS:  COWS Total Score: 2  Musculoskeletal: Strength & Muscle Tone: within normal limits Gait & Station: normal Patient leans: N/A  Psychiatric Specialty Exam: Physical Exam  Review of Systems  Constitutional: Negative.   Skin: Negative.   Neurological: Negative for tremors.  Psychiatric/Behavioral: Positive for hallucinations. The patient is nervous/anxious.     Blood pressure 129/77, pulse 94, temperature 98.2 F (36.8 C), temperature source Oral, resp.  rate 20, height _0  (1.803 m), weight 132.5 kg (292 lb), last menstrual period 08/03/2016, SpO2 100 %.Body mass index is 40.73 kg/m.  General Appearance: Casual  Eye Contact:  Fair  Speech:  Slow  Volume:  Decreased  Mood:  Anxious  Affect:  Constricted  Thought Process:  Linear  Orientation:  Full (Time, Place, and Person)  Thought Content:  Hallucinations: Auditory, Paranoid Ideation and Rumination at times thought blocking   Suicidal Thoughts:  No  Homicidal Thoughts:  No  Memory:  Immediate;   Fair Recent;   Fair Remote;   Fair  Judgement:  Fair  Insight:  Fair  Psychomotor Activity:  Decreased  Concentration:  Concentration: Fair and Attention Span: Fair  Recall:  [employer] of Knowledge:  Fair  Language:  Good  Akathisia:  No  Handed:  Right  AIMS (if indicated):     Assets:  Desire for Improvement Housing  ADL's:  Intact  Cognition:  WNL  Sleep:  Number of Hours: 6.75     Treatment Plan Summary: patient continued to exhibit symptoms of paranoia and having hallucination but slowly and gradually improving.  Patient shows improvement from the past.  She has no side effects.  We will continue Haldol 5 mg twice a day to help psychosis, Continue Cogentin 0.5 mg twice a day to help EPS, Continue Depakote 750 mg at bedtime.  Last Depakote level 53. Patient has no tremors or shakes.  Continue Ativan 0.5 mg and trazodone and Ambien for insomnia. Encouraged to parts of it in group milieu therapy. Social worker to continue on disposition.   Kristopher Attwood T., MD 08/13/2016, 1:31 PM

## 2016-08-13 NOTE — Progress Notes (Signed)
DAR NOTE: Pt present with flat affect and depressed mood in the unit. Pt has been isolating herself and has been bed most of the time. Pt denies physical pain, took all her meds as scheduled. As per self inventory, pt had a good night sleep, good appetite, normal energy, and good concentration. Pt rate depression at 0, hopeless ness at 0, and anxiety at 1. Pt's safety ensured with 15 minute and environmental checks. Pt currently denies SI/HI and A/V hallucinations. Pt verbally agrees to seek staff if SI/HI or A/VH occurs and to consult with staff before acting on these thoughts. Will continue POC.

## 2016-08-14 LAB — GLUCOSE, CAPILLARY: Glucose-Capillary: 135 mg/dL — ABNORMAL HIGH (ref 65–99)

## 2016-08-14 MED ORDER — HALOPERIDOL DECANOATE 100 MG/ML IM SOLN: 100.0000 mg | INTRAMUSCULAR | 0 refills | Status: DC

## 2016-08-14 MED ORDER — HALOPERIDOL 5 MG PO TABS: 5.0000 mg | ORAL_TABLET | ORAL | 0 refills | Status: DC

## 2016-08-14 MED ORDER — ZOLPIDEM TARTRATE 10 MG PO TABS: 10.0000 mg | ORAL_TABLET | Freq: Every day | ORAL | 0 refills | Status: DC

## 2016-08-14 MED ORDER — HALOPERIDOL DECANOATE 100 MG/ML IM SOLN
100.0000 mg | INTRAMUSCULAR | Status: DC
  Administered 2016-08-14: 100 mg via INTRAMUSCULAR
  Filled 2016-08-14: qty 1

## 2016-08-14 MED ORDER — DIVALPROEX SODIUM ER 250 MG PO TB24: 750.0000 mg | ORAL_TABLET | Freq: Every day | ORAL | 0 refills | Status: DC

## 2016-08-14 MED ORDER — TRAZODONE HCL 100 MG PO TABS: 100.0000 mg | ORAL_TABLET | Freq: Every evening | ORAL | 0 refills | Status: DC | PRN

## 2016-08-14 MED ORDER — BENZTROPINE MESYLATE 0.5 MG PO TABS: 0.5000 mg | ORAL_TABLET | ORAL | 0 refills | Status: DC

## 2016-08-14 MED ORDER — METFORMIN HCL 500 MG PO TABS: 500.0000 mg | ORAL_TABLET | Freq: Two times a day (BID) | ORAL | 0 refills | Status: DC

## 2016-08-14 NOTE — [facility] Suicide Risk Assessment (Signed)
[facility] Discharge Suicide Risk Assessment   Principal Problem: Bipolar disorder, curr episode mixed, severe, with psychotic features [facility]) Discharge Diagnoses:  Patient Active Problem List   Diagnosis Date Noted  . Diabetes mellitus ([place]) [E11.9] 08/10/2016  . Bipolar disorder, curr episode mixed, severe, with psychotic features ([name]) [F31.64] 08/07/2016  . Cannabis use disorder, moderate, dependence ([place]) [F12.20] 08/07/2016  . CHOLECYSTITIS, UNSPECIFIED [K81.9] 06/12/2008  . BILIARY COLIC [id number]  . CERUMEN IMPACTION, BILATERAL [H61.20] 01/17/2008  . PHARYNGITIS, VIRAL [J02.9] 01/17/2008  . NECK PAIN [M54.2] 01/17/2008    Total Time spent with patient: 30 minutes  Musculoskeletal: Strength & Muscle Tone: within normal limits Gait & Station: normal Patient leans: N/A  Psychiatric Specialty Exam: Review of Systems  Psychiatric/Behavioral: Positive for substance abuse. Negative for depression. The patient is not nervous/anxious.   All other systems reviewed and are negative.   Blood pressure 119/63, pulse 94, temperature 97.8 F (36.6 C), temperature source Oral, resp. rate 16, height 5\' 11"  (1.803 m), weight 132.5 kg (292 lb), last menstrual period 08/03/2016, SpO2 100 %.Body mass index is 40.73 kg/m.  General Appearance: Casual  Eye Contact::  Fair  Speech:  Clear and Coherent409  Volume:  Normal  Mood:  Euthymic  Affect:  Appropriate  Thought Process:  Goal Directed and Descriptions of Associations: Intact  Orientation:  Full (Time, Place, and Person)  Thought Content:  Logical  Suicidal Thoughts:  No  Homicidal Thoughts:  No  Memory:  Immediate;   Fair Recent;   Fair Remote;   Fair  Judgement:  Fair  Insight:  Fair  Psychomotor Activity:  Normal  Concentration:  Fair  Recall:  [employer] of Knowledge:Fair  Language: Fair  Akathisia:  No  Handed:  Right  AIMS (if indicated):   0  Assets:  Desire for Improvement  Sleep:  Number of Hours: 6.75   Cognition: WNL  ADL's:  Intact   Mental Status Per Nursing Assessment::   On Admission:  NA  Demographic Factors:  NA  Loss Factors: NA  Historical Factors: Impulsivity  Risk Reduction Factors:   Positive social support and Positive therapeutic relationship  Continued Clinical Symptoms:  Previous Psychiatric Diagnoses and Treatments Medical Diagnoses and Treatments/Surgeries  Cognitive Features That Contribute To Risk:  None    Suicide Risk:  Minimal: No identifiable suicidal ideation.  Patients presenting with no risk factors but with morbid ruminations; may be classified as minimal risk based on the severity of the depressive symptoms  Follow-up Information    [facility]. Go to.   Specialty:  Behavioral Health Why:  Appointment is on August 16, 2016 at 11:40am with Dr. Clinton Sawyer. Contact information: [place] [address] [phone]           Plan Of Care/Follow-up recommendations:  Activity:  no restrictions Diet:  carb modified, diabetic diet Tests:  Follow up on your Hba1c Other:  Haldol decanoate 100 mg IM - first dose - 08/14/16 - repeat q30 days  Rosalva Neary, MD 08/14/2016, 9:22 AM

## 2016-08-14 NOTE — Plan of Care (Signed)
Problem: [facility] Participation in Recreation Therapeutic Interventions Goal: STG-Patient will identify at least five coping skills for ** STG: Coping Skills - Patient will be able to identify at least 5 coping skills for stress by conclusion of recreation therapy tx  Outcome: Completed/Met Date Met: 08/14/16 Pt was able to identify coping skills at the completion of coping skills recreation therapy session.  Victorino Sparrow, LRT/CTRS

## 2016-08-14 NOTE — Discharge Summary (Signed)
Physician Discharge Summary Note  Patient:  [name] is an 41 y.o., female MRN:  [id number] DOB:  October 27, 1975 Patient phone:  [phone] (home)  Patient address:   [address] [place] [address],  Total Time spent with patient: 30 minutes  Date of Admission:  08/04/2016 Date of Discharge: 08/14/2016  Reason for Admission:    Principal Problem: Bipolar disorder, curr episode mixed, severe, with psychotic features [facility]) Discharge Diagnoses: Patient Active Problem List   Diagnosis Date Noted  . Diabetes mellitus ([place]) [E11.9] 08/10/2016  . Bipolar disorder, curr episode mixed, severe, with psychotic features ([place]) [F31.64] 08/07/2016  . Cannabis use disorder, moderate, dependence ([place]) [F12.20] 08/07/2016  . CHOLECYSTITIS, UNSPECIFIED [K81.9] 06/12/2008  . BILIARY COLIC [id number]  . CERUMEN IMPACTION, BILATERAL [H61.20] 01/17/2008  . PHARYNGITIS, VIRAL [J02.9] 01/17/2008  . NECK PAIN [M54.2] 01/17/2008    Past Psychiatric History: see HPI  Past Medical History:  Past Medical History:  Diagnosis Date  . Abnormal Pap smear   . Bipolar 1 disorder ([place])   . Fibroids   . IBS (irritable bowel syndrome)     Past Surgical History:  Procedure Laterality Date  . CERVICAL BIOPSY    . CHOLECYSTECTOMY    . TUBAL LIGATION     Family History:  Family History  Problem Relation Age of Onset  . Diabetes Father   . Diabetes Paternal Grandmother   . Diabetes Maternal Grandmother   . Mental illness Cousin    Family Psychiatric  History: see HPI Social History:  History  Alcohol Use  . Yes    Comment: occasional     History  Drug Use    Comment: CBD oil    Social History   Social History  . Marital status: Married    Spouse name: N/A  . Number of children: N/A  . Years of education: N/A   Social History Main Topics  . Smoking status: Current Every Day Smoker    Packs/day: 0.25    Years: 0.00    Types: Cigarettes  . Smokeless tobacco: Never  Used  . Alcohol use Yes     Comment: occasional  . Drug use: Yes     Comment: CBD oil  . Sexual activity: Not Currently   Other Topics Concern  . None   Social History Narrative  . None    Hospital Course:  [name], 41 year old African-American female.  Admitted to the [facility] adult unit from the [facility] with complaints of altered mental status. Patient  is a poor historian. Her memory is in bits & pieces.  She is tearful, slow to respond, stares at people, presents with thought blocking, guilt & appears to be responding to some internal stimuli."    [name] was admitted for Bipolar disorder, curr episode mixed, severe, with psychotic features ([place]) and crisis management.  Patient was treated with medications with their indications listed below in detail under Medication List.  Medical problems were identified and treated as needed.  Home medications were restarted as appropriate.  Improvement was monitored by observation and Alvis Lemmings [name] daily report of symptom reduction.  Emotional and mental status was monitored by daily self inventory reports completed by [name] and clinical staff.  Patient reported continued improvement, denied any new concerns.  Patient had been compliant on medications and denied side effects.  Support and encouragement was provided.    Patient encouraged to attend groups to help with recognizing triggers of emotional crises  and de-stabilizations.  Patient encouraged to attend group to help identify the positive things in life that would help in dealing with feelings of loss, depression and unhealthy or abusive tendencies.         [name] was evaluated by the treatment team for stability and plans for continued recovery upon discharge.  Patient was offered further treatment options upon discharge including Residential, Intensive Outpatient and Outpatient treatment. Patient will follow up with agency listed below for medication management and  counseling.  Encouraged patient to maintain satisfactory support network and home environment.  Advised to adhere to medication compliance and outpatient treatment follow up.  Prescriptions provided.       [name] motivation was an integral factor for scheduling further treatment.  Employment, transportation, bed availability, health status, family support, and any pending legal issues were also considered during patient's hospital stay.  Upon completion of this admission the patient was both mentally and medically stable for discharge denying suicidal/homicidal ideation, auditory/visual/tactile hallucinations, delusional thoughts and paranoia.      Physical Findings: AIMS: Facial and Oral Movements Muscles of Facial Expression: None, normal Lips and Perioral Area: None, normal Jaw: None, normal Tongue: None, normal,Extremity Movements Upper (arms, wrists, hands, fingers): None, normal Lower (legs, knees, ankles, toes): None, normal, Trunk Movements Neck, shoulders, hips: None, normal, Overall Severity Severity of abnormal movements (highest score from questions above): None, normal Incapacitation due to abnormal movements: None, normal Patient's awareness of abnormal movements (rate only patient's report): No Awareness, Dental Status Current problems with teeth and/or dentures?: No Does patient usually wear dentures?: No  CIWA:  CIWA-Ar Total: 1 COWS:  COWS Total Score: 2  Musculoskeletal: Strength & Muscle Tone: within normal limits Gait & Station: normal Patient leans: N/A  Psychiatric Specialty Exam:  See MD SRA Physical Exam  ROS  Blood pressure 119/63, pulse 94, temperature 97.8 F (36.6 C), temperature source Oral, resp. rate 16, height 5\' 11"  (1.803 m), weight 132.5 kg (292 lb), last menstrual period 08/03/2016, SpO2 100 %.Body mass index is 40.73 kg/m.   Have you used any form of tobacco in the last 30 days? (Cigarettes, Smokeless Tobacco, Cigars, and/or Pipes): Yes   Has this patient used any form of tobacco in the last 30 days? (Cigarettes, Smokeless Tobacco, Cigars, and/or Pipes) Yes, N/A  Blood Alcohol level:  Lab Results  Component Value Date   ETH <5 08/03/2016   ETH <5 [id number]    Metabolic Disorder Labs:  Lab Results  Component Value Date   HGBA1C 7.6 (H) 08/06/2016   MPG 171 08/06/2016   Lab Results  Component Value Date   PROLACTIN 8.5 08/06/2016   Lab Results  Component Value Date   CHOL 162 08/06/2016   TRIG 80 08/06/2016   HDL 40 (L) 08/06/2016   CHOLHDL 4.1 08/06/2016   VLDL 16 08/06/2016   LDLCALC 106 (H) 08/06/2016    See Psychiatric Specialty Exam and Suicide Risk Assessment completed by Attending Physician prior to discharge.  Discharge destination:  Home  Is patient on multiple antipsychotic therapies at discharge:  No   Has Patient had three or more failed trials of antipsychotic monotherapy by history:  No  Recommended Plan for Multiple Antipsychotic Therapies: NA   Allergies as of 08/14/2016   No Known Allergies     Medication List    STOP taking these medications   busPIRone 10 MG tablet Commonly known as:  BUSPAR   lurasidone 40 MG Tabs tablet Commonly known as:  LATUDA  TAKE these medications     Indication  benztropine 0.5 MG tablet Commonly known as:  COGENTIN Take 1 tablet (0.5 mg total) by mouth 2 (two) times daily in the am and at bedtime..  Indication:  Extrapyramidal Reaction caused by Medications   divalproex 250 MG 24 hr tablet Commonly known as:  DEPAKOTE ER Take 3 tablets (750 mg total) by mouth at bedtime.  Indication:  mood stabilization   haloperidol 5 MG tablet Commonly known as:  HALDOL Take 1 tablet (5 mg total) by mouth 2 (two) times daily in the am and at bedtime..  Indication:  mood stabilization   haloperidol decanoate 100 MG/ML injection Commonly known as:  HALDOL DECANOATE Inject 1 mL (100 mg total) into the muscle every 30 (thirty) days. Next dose due  09/11/2016  Indication:  mood stabilization   metFORMIN 500 MG tablet Commonly known as:  GLUCOPHAGE Take 1 tablet (500 mg total) by mouth 2 (two) times daily with a meal.  Indication:  Type 2 Diabetes   traZODone 100 MG tablet Commonly known as:  DESYREL Take 1 tablet (100 mg total) by mouth at bedtime as needed for sleep. What changed:  medication strength  how much to take  when to take this  reasons to take this  Indication:  Major Depressive Disorder   zolpidem 10 MG tablet Commonly known as:  AMBIEN Take 1 tablet (10 mg total) by mouth at bedtime.  Indication:  Trouble Sleeping      Follow-up Information    [facility]. Go to.   Specialty:  Behavioral Health Why:  Appointment is on August 16, 2016 at 11:40am with Dr. Clinton Sawyer. Contact information: [place] [address] [phone]           Follow-up recommendations:  Activity:  as tola Diet:  s tol  Comments:  1.  Take all your medications as prescribed.   2.  Report any adverse side effects to outpatient provider. 3.  Patient instructed to not use alcohol or illegal drugs while on prescription medicines. 4.  In the event of worsening symptoms, instructed patient to call 911, the crisis hotline or go to nearest emergency room for evaluation of symptoms.  Signed: Janett Labella, NP [facility] 08/14/2016, 2:20 PM

## 2016-08-14 NOTE — Progress Notes (Signed)
  [facility] Adult Case Management Discharge Plan :  Will you be returning to the same living situation after discharge:  Yes,  Pt returning home with family At discharge, do you have transportation home?: Yes,  Pt family to pick up Do you have the ability to pay for your medications: Yes,  Pt provided with prescriptions  Release of information consent forms completed and in the chart;  Patient's signature needed at discharge.  Patient to Follow up at: Follow-up Information    [facility]. Go to.   Specialty:  Behavioral Health Why:  Appointment is on August 16, 2016 at 11:40am with Dr. Clinton Sawyer. Contact information: [place] [address] [phone]           Next level of care provider has access to [place] and Suicide Prevention discussed: Yes,  with family; see SPE note  Have you used any form of tobacco in the last 30 days? (Cigarettes, Smokeless Tobacco, Cigars, and/or Pipes): Yes  Has patient been referred to the Quitline?: Patient refused referral  Patient has been referred for addiction treatment: Yes  Gladstone Lighter 08/14/2016, 5:12 PM

## 2016-08-14 NOTE — Progress Notes (Signed)
Patient discharged to lobby. Patient was stable and appreciative at that time. All papers and prescriptions were given and valuables returned. Verbal understanding expressed. Denies SI/HI and A/VH. Patient given opportunity to express concerns and ask questions.  

## 2016-08-14 NOTE — Progress Notes (Signed)
Recreation Therapy Notes  Date: 08/14/16 Time: 1000 Location: 500 Hall Dayroom  Group Topic: Self-Esteem  Goal Area(s) Addresses:  Patient will identify positive ways to increase self-esteem. Patient will verbalize benefit of increased self-esteem.  Behavioral Response: Engaged  Intervention: Empty picture frame worksheet, markers, colored pencils  Activity: Mirror, Mirror.  Patients were given a worksheet with an empty picture frame on it.  Patients were to draw a picture and/or [place] words to describe what they think of themselves.  Education:  Self-Esteem, Dentist.   Education Outcome: Acknowledges education/In group clarification offered/Needs additional education  Clinical Observations/Feedback: Pt drew a picture of a girl and stated she was family oriented, kind, sweet, fun, approachable and conversationalist.  Pt stated one unique thing about her was her ability to do power point presentations.  Pt expressed that she is not always confident in herself and she deals with it by practicing and succeeding at things.  Pt also stated "positive thinking gives you energy."   Victorino Sparrow, LRT/CTRS       Victorino Sparrow A 08/14/2016 11:34 AM

## 2017-02-19 ENCOUNTER — Ambulatory Visit: Payer: Self-pay | Admitting: Obstetrics

## 2017-02-22 ENCOUNTER — Emergency Department (HOSPITAL_COMMUNITY)
Admission: EM | Admit: 2017-02-22 | Discharge: 2017-02-22 | Disposition: A | Discharge status: Home / Self Care | Payer: BC Managed Care – PPO | Attending: Emergency Medicine | Admitting: Emergency Medicine

## 2017-02-22 DIAGNOSIS — B9689 Other specified bacterial agents as the cause of diseases classified elsewhere: Secondary | ICD-10-CM | POA: Diagnosis not present

## 2017-02-22 DIAGNOSIS — E119 Type 2 diabetes mellitus without complications: Secondary | ICD-10-CM | POA: Insufficient documentation

## 2017-02-22 DIAGNOSIS — N760 Acute vaginitis: Secondary | ICD-10-CM | POA: Insufficient documentation

## 2017-02-22 DIAGNOSIS — F17210 Nicotine dependence, cigarettes, uncomplicated: Secondary | ICD-10-CM | POA: Diagnosis not present

## 2017-02-22 DIAGNOSIS — R1030 Lower abdominal pain, unspecified: Secondary | ICD-10-CM | POA: Diagnosis present

## 2017-02-22 DIAGNOSIS — Z7984 Long term (current) use of oral hypoglycemic drugs: Secondary | ICD-10-CM | POA: Diagnosis not present

## 2017-02-22 DIAGNOSIS — Z79899 Other long term (current) drug therapy: Secondary | ICD-10-CM | POA: Diagnosis not present

## 2017-02-22 LAB — URINALYSIS, ROUTINE W REFLEX MICROSCOPIC
Bilirubin Urine: NEGATIVE
Glucose, UA: NEGATIVE mg/dL
Hgb urine dipstick: NEGATIVE
Ketones, ur: 20 mg/dL — AB
Nitrite: NEGATIVE
Protein, ur: 100 mg/dL — AB
Specific Gravity, Urine: 1.027 (ref 1.005–1.030)
pH: 5 (ref 5.0–8.0)

## 2017-02-22 LAB — CBC
HCT: 34.4 % — ABNORMAL LOW (ref 36.0–46.0)
Hemoglobin: 11.3 g/dL — ABNORMAL LOW (ref 12.0–15.0)
MCH: 25.9 pg — ABNORMAL LOW (ref 26.0–34.0)
MCHC: 32.8 g/dL (ref 30.0–36.0)
MCV: 78.9 fL (ref 78.0–100.0)
Platelets: 340 10*3/uL (ref 150–400)
RBC: 4.36 MIL/uL (ref 3.87–5.11)
RDW: 16.7 % — ABNORMAL HIGH (ref 11.5–15.5)
WBC: 10.5 10*3/uL (ref 4.0–10.5)

## 2017-02-22 LAB — COMPREHENSIVE METABOLIC PANEL
ALT: 32 U/L (ref 14–54)
AST: 27 U/L (ref 15–41)
Albumin: 4.2 g/dL (ref 3.5–5.0)
Alkaline Phosphatase: 77 U/L (ref 38–126)
Anion gap: 9 (ref 5–15)
BUN: 8 mg/dL (ref 6–20)
CO2: 24 mmol/L (ref 22–32)
Calcium: 9.7 mg/dL (ref 8.9–10.3)
Chloride: 107 mmol/L (ref 101–111)
Creatinine, Ser: 0.69 mg/dL (ref 0.44–1.00)
GFR calc Af Amer: >60 mL/min (ref >60)
GFR calc non Af Amer: >60 mL/min (ref >60)
Glucose, Bld: 117 mg/dL — ABNORMAL HIGH (ref 65–99)
Potassium: 3.9 mmol/L (ref 3.5–5.1)
Sodium: 140 mmol/L (ref 135–145)
Total Bilirubin: 0.5 mg/dL (ref 0.3–1.2)
Total Protein: 8.2 g/dL — ABNORMAL HIGH (ref 6.5–8.1)

## 2017-02-22 LAB — WET PREP, GENITAL
Sperm: NONE SEEN
Trich, Wet Prep: NONE SEEN
Yeast Wet Prep HPF POC: NONE SEEN

## 2017-02-22 LAB — AMMONIA: Ammonia: <9 umol/L — ABNORMAL LOW (ref 9–35)

## 2017-02-22 LAB — TSH: TSH: 0.586 u[IU]/mL (ref 0.350–4.500)

## 2017-02-22 LAB — I-STAT BETA HCG BLOOD, ED (MC, WL, AP ONLY): I-stat hCG, quantitative: <5 m[IU]/mL (ref <5)

## 2017-02-22 LAB — VALPROIC ACID LEVEL: Valproic Acid Lvl: 50 ug/mL (ref 50.0–100.0)

## 2017-02-22 LAB — PREGNANCY, URINE: Preg Test, Ur: NEGATIVE

## 2017-02-22 LAB — LIPASE, BLOOD: Lipase: 26 U/L (ref 11–51)

## 2017-02-22 MED ORDER — METRONIDAZOLE 500 MG PO TABS: 500.0000 mg | ORAL_TABLET | Freq: Two times a day (BID) | ORAL | 0 refills | Status: DC

## 2017-02-22 MED ORDER — SODIUM CHLORIDE 0.9 % IV BOLUS (SEPSIS)
1000.0000 mL | Freq: Once | INTRAVENOUS | Status: AC
  Administered 2017-02-22: 1000 mL via INTRAVENOUS

## 2017-02-22 NOTE — Discharge Instructions (Signed)
Please read attached information regarding your condition. Take Flagyl twice daily for 7 days. Continue home medications as previously prescribed. Follow up with PCP and [facility] for medication adjustments. Return to ED for worsening pain, trouble breathing, trouble swallowing, loss of consciousness, head injuries.

## 2017-02-22 NOTE — ED Triage Notes (Signed)
Pt states that she has had abdominal pain x 2 months that she has been treating at home w/o relief. Alert and oriented

## 2017-02-22 NOTE — ED Provider Notes (Signed)
[facility] DEPT Provider Note   CSN: [id number] Arrival date & time: 02/22/17  1058     History   Chief Complaint Chief Complaint  Patient presents with  . Abdominal Pain  . Altered Mental Status    HPI [name] is a 41 y.o. female.  HPI  Patient, with a past medical history of IBS, bipolar 1 disorder, presents to ED for evaluation of 2 week history of lower abdominal pain and altered mental status. She states that the abdominal pain is associated with diarrhea and vaginal discharge. Diarrhea has not improved with the use of antidiarrheals. She denies any urinary symptoms. She also reports increased anxiety and jitteriness for the past few days. She also states that she has accidentally taken 3 tablets of 250 mg Depakote this morning. She is scheduled to take 3 tablets every night but accidentally took 3 extra once this morning.    Past Medical History:  Diagnosis Date  . Abnormal Pap smear   . Bipolar 1 disorder ([place])   . Fibroids   . IBS (irritable bowel syndrome)     Patient Active Problem List   Diagnosis Date Noted  . Diabetes mellitus ([place]) 08/10/2016  . Bipolar disorder, curr episode mixed, severe, with psychotic features ([place]) 08/07/2016  . Cannabis use disorder, moderate, dependence ([place]) 08/07/2016  . CHOLECYSTITIS, UNSPECIFIED 06/12/2008  . BILIARY COLIC 61/44/3154  . CERUMEN IMPACTION, BILATERAL 01/17/2008  . PHARYNGITIS, VIRAL 01/17/2008  . NECK PAIN 01/17/2008    Past Surgical History:  Procedure Laterality Date  . CERVICAL BIOPSY    . CHOLECYSTECTOMY    . TUBAL LIGATION      OB History    Gravida Para Term Preterm AB Living   7 2 2   5 2    SAB TAB Ectopic Multiple Live Births     4 1           Home Medications    Prior to Admission medications   Medication Sig Start Date End Date Taking? Authorizing Provider  benztropine (COGENTIN) 0.5 MG tablet Take 1 tablet (0.5 mg total) by mouth 2 (two) times daily in the am and at bedtime..  08/14/16  Yes Kerrie Buffalo, NP  divalproex (DEPAKOTE ER) 250 MG 24 hr tablet Take 3 tablets (750 mg total) by mouth at bedtime. 08/14/16  Yes Kerrie Buffalo, NP  haloperidol (HALDOL) 5 MG tablet Take 1 tablet (5 mg total) by mouth 2 (two) times daily in the am and at bedtime.. 08/14/16  Yes Kerrie Buffalo, NP  ibuprofen (ADVIL,MOTRIN) 200 MG tablet Take 400 mg by mouth every 6 (six) hours as needed for mild pain.   Yes [provider]  loperamide (IMODIUM A-D) 2 MG tablet Take 2 mg by mouth 4 (four) times daily as needed for diarrhea or loose stools.   Yes [provider]  metFORMIN (GLUCOPHAGE) 500 MG tablet Take 1 tablet (500 mg total) by mouth 2 (two) times daily with a meal. 08/14/16  Yes Kerrie Buffalo, NP  traZODone (DESYREL) 100 MG tablet Take 1 tablet (100 mg total) by mouth at bedtime as needed for sleep. 08/14/16  Yes Kerrie Buffalo, NP  haloperidol decanoate (HALDOL DECANOATE) 100 MG/ML injection Inject 1 mL (100 mg total) into the muscle every 30 (thirty) days. Next dose due 09/11/2016 Patient not taking: Reported on 02/22/2017 08/14/16   Kerrie Buffalo, NP  metroNIDAZOLE (FLAGYL) 500 MG tablet Take 1 tablet (500 mg total) by mouth 2 (two) times daily. 02/22/17   Delia Heady,  PA-C    Family History Family History  Problem Relation Age of Onset  . Diabetes Father   . Diabetes Paternal Grandmother   . Diabetes Maternal Grandmother   . Mental illness Cousin     Social History Social History  Substance Use Topics  . Smoking status: Current Every Day Smoker    Packs/day: 0.25    Years: 0.00    Types: Cigarettes  . Smokeless tobacco: Never Used  . Alcohol use Yes     Comment: occasional     Allergies   Patient has no known allergies.   Review of Systems Review of Systems  Constitutional: Negative for appetite change, chills and fever.  HENT: Positive for rhinorrhea. Negative for ear pain, sneezing and sore throat.   Eyes: Negative for photophobia and  visual disturbance.  Respiratory: Positive for cough. Negative for chest tightness, shortness of breath and wheezing.   Cardiovascular: Negative for chest pain and palpitations.  Gastrointestinal: Positive for abdominal pain, diarrhea and nausea. Negative for blood in stool, constipation and vomiting.  Genitourinary: Positive for vaginal discharge. Negative for dysuria, hematuria and urgency.  Musculoskeletal: Negative for myalgias.  Skin: Negative for rash.  Neurological: Positive for dizziness and tremors. Negative for weakness and light-headedness.  Psychiatric/Behavioral: The patient is nervous/anxious.      Physical Exam Updated Vital Signs BP (!) 144/86 (BP Location: Left Arm)   Pulse 85   Temp 98.1 F (36.7 C) (Oral)   Resp 18   LMP  (LMP Unknown)   SpO2 100%   Physical Exam  Constitutional: She is oriented to person, place, and time. She appears well-developed and well-nourished. No distress.  Nontoxic appearing and in no acute distress. Able to answer questions but seems delayed.  HENT:  Head: Normocephalic and atraumatic.  Nose: Nose normal.  Eyes: Pupils are equal, round, and reactive to light. Conjunctivae and EOM are normal. Right eye exhibits no discharge. Left eye exhibits no discharge. No scleral icterus.  Neck: Normal range of motion. Neck supple.  Cardiovascular: Normal rate, regular rhythm, normal heart sounds and intact distal pulses.  Exam reveals no gallop and no friction rub.   No murmur heard. Pulmonary/Chest: Effort normal and breath sounds normal. No respiratory distress.  Abdominal: Soft. Bowel sounds are normal. She exhibits no distension. There is tenderness (Lower abdominal and suprapubic). There is no guarding.  Genitourinary: Cervix exhibits no motion tenderness and no friability. Right adnexum displays no tenderness. Left adnexum displays no tenderness. Vaginal discharge found.  Genitourinary Comments: Chaperone present throughout entirety of pelvic  exam.  Pelvic exam: normal external genitalia without evidence of trauma. VULVA: normal appearing vulva with no masses, tenderness or lesion. VAGINA: white vaginal discharge; normal appearing vagina with normal color, no lesions. CERVIX: normal appearing cervix without lesions, cervical motion tenderness absent, cervical os closed with out purulent discharge; No vaginal discharge. Wet prep and DNA probe for chlamydia and GC obtained.   ADNEXA: normal adnexa in size, nontender and no masses UTERUS: uterus is normal size, shape, consistency and nontender.    Musculoskeletal: Normal range of motion. She exhibits no edema.  Neurological: She is alert and oriented to person, place, and time. No cranial nerve deficit or sensory deficit. She exhibits normal muscle tone. Coordination normal.  Alert and oriented to person, place, time and situation. Pupils reactive. No facial asymmetry noted. Cranial nerves appear grossly intact. Sensation intact to light touch on face, BUE and BLE. Strength 5/5 in BUE and BLE. Normal patellar reflexes bilaterally.  Skin: Skin is warm and dry. No rash noted.  Psychiatric: She has a normal mood and affect.  Nursing note and vitals reviewed.    ED Treatments / Results  Labs (all labs ordered are listed, but only abnormal results are displayed) Labs Reviewed  WET PREP, GENITAL - Abnormal; Notable for the following:       Result Value   Clue Cells Wet Prep HPF POC PRESENT (*)    WBC, Wet Prep HPF POC MANY (*)    All other components within normal limits  COMPREHENSIVE METABOLIC PANEL - Abnormal; Notable for the following:    Glucose, Bld 117 (*)    Total Protein 8.2 (*)    All other components within normal limits  CBC - Abnormal; Notable for the following:    Hemoglobin 11.3 (*)    HCT 34.4 (*)    MCH 25.9 (*)    RDW 16.7 (*)    All other components within normal limits  URINALYSIS, ROUTINE W REFLEX MICROSCOPIC - Abnormal; Notable for the following:     APPearance HAZY (*)    Ketones, ur 20 (*)    Protein, ur 100 (*)    Leukocytes, UA SMALL (*)    Bacteria, UA FEW (*)    Squamous Epithelial / LPF 6-30 (*)    All other components within normal limits  AMMONIA - Abnormal; Notable for the following:    Ammonia <9 (*)    All other components within normal limits  LIPASE, BLOOD  PREGNANCY, URINE  VALPROIC ACID LEVEL  TSH  HIV ANTIBODY (ROUTINE TESTING)  RPR  CBG MONITORING, ED  I-STAT BETA HCG BLOOD, ED ([facility], [facility], [facility] ONLY)  GC/CHLAMYDIA PROBE AMP ([place]) NOT AT [facility]    EKG  EKG Interpretation  Date/Time:  Thursday February 22 2017 14:47:15 EDT Ventricular Rate:  79 PR Interval:    QRS Duration: 87 QT Interval:  384 QTC Calculation: 441 R Axis:   1 Text Interpretation:  Sinus rhythm No STEMI.  Confirmed by Nanda Quinton [phone]) on 02/22/2017 3:20:51 PM       Radiology No results found.  Procedures Procedures (including critical care time)  Medications Ordered in ED Medications  sodium chloride 0.9 % bolus 1,000 mL (1,000 mLs Intravenous New Bag/Given 02/22/17 1516)     Initial Impression / Assessment and Plan / ED Course  I have reviewed the triage vital signs and the nursing notes.  Pertinent labs & imaging results that were available during my care of the patient were reviewed by me and considered in my medical decision making (see chart for details).     Patient presents to ED for evaluation of abdominal pain and altered mental status. My initial evaluation patient states that she accidentally took 3 extra tablets of her Depakote this morning, which would make her at 1500 milligrams in 12 hours. However when being evaluated by Dr. Laverta Baltimore she states that she has been noncompliant with her medications for the past few weeks. She reports lower abdominal pain but no significant tenderness to palpation on my exam. There are no focal deficits on neurological examination she is alert and oriented 4. However she does have  some delay in responding. Vital signs are stable. She does complain of vaginal discharge.  Contacted poison control who states that we obtain a CMP, Depakote level. CMP unremarkable. Depakote level is therapeutic at 50. HCG negative. CBC unremarkable. Lipase unremarkable. Wet prep showed clue cells. Ammonia level normal. I have low suspicion for  surgical abdomen being the cause of her abdominal pain considering her history of IBS. No need for further imaging of head at this time considering no focal deficits and altered mental status more likely to be psychiatric related due to noncompliance with medication. Encouraged her to follow up with monitor for further evaluation medication changes. We will give her Flagyl for BV. Patient appears stable for discharge at this time. Strict return precautions given.     Final diagnoses:  Bacterial vaginosis    New Prescriptions New Prescriptions   METRONIDAZOLE (FLAGYL) 500 MG TABLET    Take 1 tablet (500 mg total) by mouth 2 (two) times daily.     Delia Heady, PA-C 02/22/17 1635    Margette Fast, MD 02/22/17 [phone]

## 2017-02-23 LAB — RPR: RPR Ser Ql: NONREACTIVE

## 2017-02-23 LAB — GC/CHLAMYDIA PROBE AMP (~~LOC~~) NOT AT ARMC
Chlamydia: NEGATIVE
Neisseria Gonorrhea: NEGATIVE

## 2017-02-23 LAB — HIV ANTIBODY (ROUTINE TESTING W REFLEX): HIV Screen 4th Generation wRfx: NONREACTIVE

## 2017-02-24 ENCOUNTER — Encounter (HOSPITAL_COMMUNITY): Payer: Self-pay | Admitting: Emergency Medicine

## 2017-02-24 ENCOUNTER — Emergency Department (HOSPITAL_COMMUNITY)
Admission: EM | Admit: 2017-02-24 | Discharge: 2017-02-25 | Disposition: A | Discharge status: Other Acute Inpatient Hospital | Payer: BC Managed Care – PPO | Attending: Emergency Medicine | Admitting: Emergency Medicine

## 2017-02-24 ENCOUNTER — Ambulatory Visit (HOSPITAL_COMMUNITY)
Admission: RE | Admit: 2017-02-24 | Discharge: 2017-02-24 | Disposition: A | Discharge status: Home / Self Care | Payer: BC Managed Care – PPO | Source: Home / Self Care | Attending: Psychiatry | Admitting: Psychiatry

## 2017-02-24 DIAGNOSIS — Z008 Encounter for other general examination: Secondary | ICD-10-CM

## 2017-02-24 DIAGNOSIS — Z79899 Other long term (current) drug therapy: Secondary | ICD-10-CM | POA: Insufficient documentation

## 2017-02-24 DIAGNOSIS — Z7984 Long term (current) use of oral hypoglycemic drugs: Secondary | ICD-10-CM | POA: Insufficient documentation

## 2017-02-24 DIAGNOSIS — E119 Type 2 diabetes mellitus without complications: Secondary | ICD-10-CM | POA: Insufficient documentation

## 2017-02-24 DIAGNOSIS — F17210 Nicotine dependence, cigarettes, uncomplicated: Secondary | ICD-10-CM | POA: Insufficient documentation

## 2017-02-24 DIAGNOSIS — F315 Bipolar disorder, current episode depressed, severe, with psychotic features: Secondary | ICD-10-CM

## 2017-02-24 DIAGNOSIS — F3164 Bipolar disorder, current episode mixed, severe, with psychotic features: Secondary | ICD-10-CM | POA: Diagnosis present

## 2017-02-24 DIAGNOSIS — Z048 Encounter for examination and observation for other specified reasons: Secondary | ICD-10-CM | POA: Diagnosis present

## 2017-02-24 NOTE — H&P (Signed)
Behavioral Health Medical Screening Exam  [name] is an 41 y.o. female.  Total Time spent with patient: 15 minutes  Psychiatric Specialty Exam: Physical Exam  Constitutional: She appears well-developed and well-nourished. No distress.  HENT:  Head: Normocephalic and atraumatic.  Right Ear: External ear normal.  Left Ear: External ear normal.  Eyes: Conjunctivae are normal. Right eye exhibits no discharge. Left eye exhibits no discharge. No scleral icterus.  Cardiovascular: Normal rate.   Respiratory: Effort normal. No respiratory distress.  Musculoskeletal: Normal range of motion.  Neurological: She is alert. She is disoriented.  Skin: Skin is warm and dry. She is not diaphoretic.  Psychiatric: Her mood appears anxious. Her affect is blunt. Her speech is delayed. She expresses impulsivity and inappropriate judgment. She exhibits a depressed mood. She expresses no homicidal and no suicidal ideation.    Review of Systems  Constitutional: Negative.   Respiratory: Negative for cough, shortness of breath and wheezing.   Cardiovascular: Negative for chest pain.  Gastrointestinal: Positive for abdominal pain and diarrhea.  Neurological: Negative for dizziness, loss of consciousness and headaches.  Psychiatric/Behavioral: Positive for depression and hallucinations. Negative for memory loss, substance abuse and suicidal ideas. The patient is nervous/anxious and has insomnia.   All other systems reviewed and are negative.   Blood pressure (!) 152/95, pulse 79, temperature 98.4 F (36.9 C), temperature source Oral, resp. rate 18, SpO2 99 %.There is no height or weight on file to calculate BMI.  General Appearance: Casual and Fairly Groomed  Eye Contact:  Fair  Speech:  Blocked and Slow  Volume:  Normal  Mood:  Anxious, Depressed, Dysphoric, Hopeless, Irritable and Worthless  Affect:  Blunt and Tearful  Thought Process:  Disorganized  Orientation:  Other:  Disoriented to time and  situation  Thought Content:  Hallucinations: Auditory Visual  Suicidal Thoughts:  No  Homicidal Thoughts:  No  Memory:  Immediate;   Fair Recent;   Fair Remote;   Poor  Judgement:  Impaired  Insight:  Lacking  Psychomotor Activity:  Decreased  Concentration: Concentration: Poor and Attention Span: Poor  Recall:  Poor  Fund of Knowledge:Poor  Language: Fair  Akathisia:  No  Handed:  Right  AIMS (if indicated):     Assets:  Communication Skills Desire for Improvement Financial Resources/Insurance Housing Leisure Time Physical Health Social Support Transportation  Sleep:       Musculoskeletal: Strength & Muscle Tone: within normal limits Gait & Station: normal   Blood pressure (!) 152/95, pulse 79, temperature 98.4 F (36.9 C), temperature source Oral, resp. rate 18, SpO2 99 %.  Recommendations:  Based on my evaluation the patient appears to have an emergency medical condition for which I recommend the patient be transferred to the emergency department for further evaluation.  Rozetta Nunnery, NP 02/24/2017, 11:19 PM
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
## 2017-02-24 NOTE — ED Triage Notes (Signed)
Pt transferred from [facility]. Per [facility] they have no available bed. Pt is flat affect and unable to answer question.

## 2017-02-25 ENCOUNTER — Inpatient Hospital Stay (HOSPITAL_COMMUNITY)
Admission: AD | Admit: 2017-02-25 | Discharge: 2017-03-05 | DRG: 885 | Disposition: A | Discharge status: Home / Self Care | Payer: BC Managed Care – PPO | Source: Intra-hospital | Attending: Psychiatry | Admitting: Psychiatry

## 2017-02-25 ENCOUNTER — Encounter (HOSPITAL_COMMUNITY): Payer: Self-pay | Admitting: *Deleted

## 2017-02-25 DIAGNOSIS — D259 Leiomyoma of uterus, unspecified: Secondary | ICD-10-CM | POA: Diagnosis present

## 2017-02-25 DIAGNOSIS — K589 Irritable bowel syndrome without diarrhea: Secondary | ICD-10-CM | POA: Diagnosis present

## 2017-02-25 DIAGNOSIS — Z791 Long term (current) use of non-steroidal anti-inflammatories (NSAID): Secondary | ICD-10-CM

## 2017-02-25 DIAGNOSIS — Z833 Family history of diabetes mellitus: Secondary | ICD-10-CM | POA: Diagnosis not present

## 2017-02-25 DIAGNOSIS — F1910 Other psychoactive substance abuse, uncomplicated: Secondary | ICD-10-CM | POA: Diagnosis not present

## 2017-02-25 DIAGNOSIS — F4310 Post-traumatic stress disorder, unspecified: Secondary | ICD-10-CM | POA: Diagnosis present

## 2017-02-25 DIAGNOSIS — Z79899 Other long term (current) drug therapy: Secondary | ICD-10-CM | POA: Diagnosis not present

## 2017-02-25 DIAGNOSIS — R413 Other amnesia: Secondary | ICD-10-CM | POA: Diagnosis not present

## 2017-02-25 DIAGNOSIS — G4700 Insomnia, unspecified: Secondary | ICD-10-CM | POA: Diagnosis present

## 2017-02-25 DIAGNOSIS — F314 Bipolar disorder, current episode depressed, severe, without psychotic features: Secondary | ICD-10-CM | POA: Diagnosis not present

## 2017-02-25 DIAGNOSIS — Z6841 Body Mass Index (BMI) 40.0 and over, adult: Secondary | ICD-10-CM | POA: Diagnosis not present

## 2017-02-25 DIAGNOSIS — E876 Hypokalemia: Secondary | ICD-10-CM | POA: Diagnosis present

## 2017-02-25 DIAGNOSIS — Z9851 Tubal ligation status: Secondary | ICD-10-CM

## 2017-02-25 DIAGNOSIS — Z818 Family history of other mental and behavioral disorders: Secondary | ICD-10-CM | POA: Diagnosis not present

## 2017-02-25 DIAGNOSIS — E611 Iron deficiency: Secondary | ICD-10-CM | POA: Diagnosis present

## 2017-02-25 DIAGNOSIS — F17210 Nicotine dependence, cigarettes, uncomplicated: Secondary | ICD-10-CM | POA: Diagnosis present

## 2017-02-25 DIAGNOSIS — E119 Type 2 diabetes mellitus without complications: Secondary | ICD-10-CM | POA: Diagnosis present

## 2017-02-25 DIAGNOSIS — N390 Urinary tract infection, site not specified: Secondary | ICD-10-CM | POA: Diagnosis present

## 2017-02-25 DIAGNOSIS — F1220 Cannabis dependence, uncomplicated: Secondary | ICD-10-CM | POA: Diagnosis present

## 2017-02-25 DIAGNOSIS — F29 Unspecified psychosis not due to a substance or known physiological condition: Secondary | ICD-10-CM | POA: Diagnosis present

## 2017-02-25 DIAGNOSIS — Z630 Problems in relationship with spouse or partner: Secondary | ICD-10-CM | POA: Diagnosis not present

## 2017-02-25 DIAGNOSIS — Z9049 Acquired absence of other specified parts of digestive tract: Secondary | ICD-10-CM

## 2017-02-25 DIAGNOSIS — F3164 Bipolar disorder, current episode mixed, severe, with psychotic features: Secondary | ICD-10-CM | POA: Diagnosis present

## 2017-02-25 DIAGNOSIS — Z7984 Long term (current) use of oral hypoglycemic drugs: Secondary | ICD-10-CM

## 2017-02-25 DIAGNOSIS — F419 Anxiety disorder, unspecified: Secondary | ICD-10-CM | POA: Diagnosis not present

## 2017-02-25 DIAGNOSIS — R4182 Altered mental status, unspecified: Secondary | ICD-10-CM | POA: Diagnosis not present

## 2017-02-25 DIAGNOSIS — F39 Unspecified mood [affective] disorder: Secondary | ICD-10-CM | POA: Diagnosis not present

## 2017-02-25 DIAGNOSIS — R450 Nervousness: Secondary | ICD-10-CM | POA: Diagnosis not present

## 2017-02-25 LAB — COMPREHENSIVE METABOLIC PANEL
ALT: 26 U/L (ref 14–54)
AST: 19 U/L (ref 15–41)
Albumin: 3.7 g/dL (ref 3.5–5.0)
Alkaline Phosphatase: 60 U/L (ref 38–126)
Anion gap: 11 (ref 5–15)
BUN: 9 mg/dL (ref 6–20)
CO2: 23 mmol/L (ref 22–32)
Calcium: 9.1 mg/dL (ref 8.9–10.3)
Chloride: 106 mmol/L (ref 101–111)
Creatinine, Ser: 0.65 mg/dL (ref 0.44–1.00)
GFR calc Af Amer: >60 mL/min (ref >60)
GFR calc non Af Amer: >60 mL/min (ref >60)
Glucose, Bld: 119 mg/dL — ABNORMAL HIGH (ref 65–99)
Potassium: 3.1 mmol/L — ABNORMAL LOW (ref 3.5–5.1)
Sodium: 140 mmol/L (ref 135–145)
Total Bilirubin: 0.4 mg/dL (ref 0.3–1.2)
Total Protein: 7.3 g/dL (ref 6.5–8.1)

## 2017-02-25 LAB — CBC
HCT: 32.8 % — ABNORMAL LOW (ref 36.0–46.0)
Hemoglobin: 10.7 g/dL — ABNORMAL LOW (ref 12.0–15.0)
MCH: 25.9 pg — ABNORMAL LOW (ref 26.0–34.0)
MCHC: 32.6 g/dL (ref 30.0–36.0)
MCV: 79.4 fL (ref 78.0–100.0)
Platelets: 331 10*3/uL (ref 150–400)
RBC: 4.13 MIL/uL (ref 3.87–5.11)
RDW: 16.9 % — ABNORMAL HIGH (ref 11.5–15.5)
WBC: 10.8 10*3/uL — ABNORMAL HIGH (ref 4.0–10.5)

## 2017-02-25 LAB — URINALYSIS, ROUTINE W REFLEX MICROSCOPIC
Bacteria, UA: NONE SEEN
Bilirubin Urine: NEGATIVE
Glucose, UA: NEGATIVE mg/dL
Hgb urine dipstick: NEGATIVE
Ketones, ur: 20 mg/dL — AB
Nitrite: NEGATIVE
Protein, ur: 30 mg/dL — AB
Specific Gravity, Urine: 1.025 (ref 1.005–1.030)
pH: 6 (ref 5.0–8.0)

## 2017-02-25 LAB — RAPID URINE DRUG SCREEN, HOSP PERFORMED
Amphetamines: NOT DETECTED
Barbiturates: NOT DETECTED
Benzodiazepines: NOT DETECTED
Cocaine: NOT DETECTED
Opiates: NOT DETECTED

## 2017-02-25 LAB — ETHANOL: Alcohol, Ethyl (B): <5 mg/dL (ref <5)

## 2017-02-25 LAB — ACETAMINOPHEN LEVEL: Acetaminophen (Tylenol), Serum: <10 ug/mL — ABNORMAL LOW (ref 10–30)

## 2017-02-25 LAB — VALPROIC ACID LEVEL: Valproic Acid Lvl: 80 ug/mL (ref 50.0–100.0)

## 2017-02-25 LAB — SALICYLATE LEVEL: Salicylate Lvl: <7 mg/dL (ref 2.8–30.0)

## 2017-02-25 LAB — AMMONIA: Ammonia: 32 umol/L (ref 9–35)

## 2017-02-25 LAB — CBG MONITORING, ED: Glucose-Capillary: 152 mg/dL — ABNORMAL HIGH (ref 65–99)

## 2017-02-25 MED ORDER — LORAZEPAM 1 MG PO TABS
1.0000 mg | ORAL_TABLET | Freq: Two times a day (BID) | ORAL | Status: DC
  Administered 2017-02-25: 1 mg via ORAL
  Filled 2017-02-25: qty 1

## 2017-02-25 MED ORDER — TRIHEXYPHENIDYL HCL 5 MG PO TABS
5.0000 mg | ORAL_TABLET | Freq: Two times a day (BID) | ORAL | Status: DC
  Administered 2017-02-26 (×2): 5 mg via ORAL
  Filled 2017-02-25 (×5): qty 1

## 2017-02-25 MED ORDER — METRONIDAZOLE 500 MG PO TABS
500.0000 mg | ORAL_TABLET | Freq: Two times a day (BID) | ORAL | Status: DC
  Administered 2017-02-25: 500 mg via ORAL
  Filled 2017-02-25: qty 1

## 2017-02-25 MED ORDER — DIVALPROEX SODIUM ER 500 MG PO TB24
500.0000 mg | ORAL_TABLET | Freq: Two times a day (BID) | ORAL | Status: DC
  Administered 2017-02-25 – 2017-02-27 (×4): 500 mg via ORAL
  Filled 2017-02-25 (×10): qty 1

## 2017-02-25 MED ORDER — METFORMIN HCL 500 MG PO TABS
500.0000 mg | ORAL_TABLET | Freq: Two times a day (BID) | ORAL | Status: DC
  Administered 2017-02-25: 500 mg via ORAL
  Filled 2017-02-25: qty 1

## 2017-02-25 MED ORDER — DIVALPROEX SODIUM ER 500 MG PO TB24: 750.0000 mg | ORAL_TABLET | Freq: Every day | ORAL | Status: DC

## 2017-02-25 MED ORDER — ACETAMINOPHEN 325 MG PO TABS
650.0000 mg | ORAL_TABLET | Freq: Four times a day (QID) | ORAL | Status: DC | PRN
  Administered 2017-02-26: 650 mg via ORAL
  Filled 2017-02-25: qty 2

## 2017-02-25 MED ORDER — ALUM & MAG HYDROXIDE-SIMETH 200-200-20 MG/5ML PO SUSP: 30.0000 mL | ORAL | Status: DC | PRN

## 2017-02-25 MED ORDER — RISPERIDONE 1 MG PO TABS
1.0000 mg | ORAL_TABLET | Freq: Two times a day (BID) | ORAL | Status: DC
  Administered 2017-02-25 – 2017-02-28 (×6): 1 mg via ORAL
  Filled 2017-02-25 (×11): qty 1

## 2017-02-25 MED ORDER — METFORMIN HCL 500 MG PO TABS
500.0000 mg | ORAL_TABLET | Freq: Two times a day (BID) | ORAL | Status: DC
  Administered 2017-02-26 – 2017-03-05 (×15): 500 mg via ORAL
  Filled 2017-02-25 (×17): qty 1

## 2017-02-25 MED ORDER — TRAZODONE HCL 100 MG PO TABS: 100.0000 mg | ORAL_TABLET | Freq: Every evening | ORAL | Status: DC | PRN

## 2017-02-25 MED ORDER — METRONIDAZOLE 500 MG PO TABS: 500.0000 mg | ORAL_TABLET | Freq: Two times a day (BID) | ORAL | Status: DC

## 2017-02-25 MED ORDER — METRONIDAZOLE 500 MG PO TABS
500.0000 mg | ORAL_TABLET | Freq: Two times a day (BID) | ORAL | Status: AC
  Administered 2017-02-25 – 2017-03-01 (×9): 500 mg via ORAL
  Filled 2017-02-25 (×6): qty 1
  Filled 2017-02-25 (×3): qty 2
  Filled 2017-02-25 (×3): qty 1

## 2017-02-25 MED ORDER — LORAZEPAM 1 MG PO TABS
1.0000 mg | ORAL_TABLET | Freq: Two times a day (BID) | ORAL | Status: DC
  Administered 2017-02-25 – 2017-02-26 (×3): 1 mg via ORAL
  Filled 2017-02-25 (×4): qty 1

## 2017-02-25 MED ORDER — TRAZODONE HCL 100 MG PO TABS
100.0000 mg | ORAL_TABLET | Freq: Every evening | ORAL | Status: DC | PRN
  Administered 2017-02-25: 100 mg via ORAL
  Filled 2017-02-25: qty 1

## 2017-02-25 MED ORDER — RISPERIDONE 1 MG PO TABS
1.0000 mg | ORAL_TABLET | Freq: Two times a day (BID) | ORAL | Status: DC
  Administered 2017-02-25: 1 mg via ORAL
  Filled 2017-02-25: qty 1

## 2017-02-25 MED ORDER — MAGNESIUM HYDROXIDE 400 MG/5ML PO SUSP: 30.0000 mL | Freq: Every day | ORAL | Status: DC | PRN

## 2017-02-25 MED ORDER — TRIHEXYPHENIDYL HCL 5 MG PO TABS
5.0000 mg | ORAL_TABLET | Freq: Two times a day (BID) | ORAL | Status: DC
  Administered 2017-02-25: 5 mg via ORAL
  Filled 2017-02-25: qty 1

## 2017-02-25 MED ORDER — DIVALPROEX SODIUM ER 500 MG PO TB24
500.0000 mg | ORAL_TABLET | Freq: Two times a day (BID) | ORAL | Status: DC
  Administered 2017-02-25: 500 mg via ORAL
  Filled 2017-02-25: qty 1
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
## 2017-02-25 NOTE — Consult Note (Addendum)
[facility] Face-to-Face Psychiatry Consult   Reason for Consult:  Suicidal ideations with psychosis Referring Physician:  EDP Patient Identification: [name] MRN:  [id number] Principal Diagnosis: Bipolar disorder, curr episode mixed, severe, with psychotic features [facility]) Diagnosis:   Patient Active Problem List   Diagnosis Date Noted  . Bipolar disorder, curr episode mixed, severe, with psychotic features ([place]) [F31.64] 08/07/2016    Priority: High  . Diabetes mellitus ([place]) [E11.9] 08/10/2016  . Cannabis use disorder, moderate, dependence ([place]) [F12.20] 08/07/2016  . CHOLECYSTITIS, UNSPECIFIED [K81.9] 06/12/2008  . BILIARY COLIC [R48.54] 62/70/3500  . CERUMEN IMPACTION, BILATERAL [H61.20] 01/17/2008  . PHARYNGITIS, VIRAL [J02.9] 01/17/2008  . NECK PAIN [M54.2] 01/17/2008    Total Time spent with patient: 45 minutes  Subjective:   [name] is a 41 y.o. female patient admitted with psychosis.  HPI:  41 yo female who presented to the ED with severe depression with psychosis.  She is going through divorce and stressed with the custody and finances.  Responding to internal stimuli, auditory and visual hallucinations.  She has been taking her medications but with all of the stress, she has decompensated.    Past Psychiatric History: bipolar affective disorder, cannabis use  Risk to Self: Is patient at risk for suicide?: No, but patient needs Medical Clearance Risk to Others:   Prior Inpatient Therapy:   Prior Outpatient Therapy:    Past Medical History:  Past Medical History:  Diagnosis Date  . Abnormal Pap smear   . Bipolar 1 disorder ([place])   . Fibroids   . IBS (irritable bowel syndrome)     Past Surgical History:  Procedure Laterality Date  . CERVICAL BIOPSY    . CHOLECYSTECTOMY    . TUBAL LIGATION     Family History:  Family History  Problem Relation Age of Onset  . Diabetes Father   . Diabetes Paternal Grandmother   . Diabetes Maternal Grandmother   . Mental  illness Cousin    Family Psychiatric  History: unknown Social History:  History  Alcohol Use  . Yes    Comment: occasional     History  Drug Use    Comment: CBD oil    Social History   Social History  . Marital status: Married    Spouse name: N/A  . Number of children: N/A  . Years of education: N/A   Social History Main Topics  . Smoking status: Current Every Day Smoker    Packs/day: 0.25    Years: 0.00    Types: Cigarettes  . Smokeless tobacco: Never Used  . Alcohol use Yes     Comment: occasional  . Drug use: Yes     Comment: CBD oil  . Sexual activity: Not Currently   Other Topics Concern  . None   Social History Narrative  . None   Additional Social History:    Allergies:  No Known Allergies  Labs:  Results for orders placed or performed during the hospital encounter of 02/24/17 (from the past 48 hour(s))  Comprehensive metabolic panel     Status: Abnormal   Collection Time: 02/24/17 11:52 PM  Result Value Ref Range   Sodium 140 135 - 145 mmol/L   Potassium 3.1 (L) 3.5 - 5.1 mmol/L   Chloride 106 101 - 111 mmol/L   CO2 23 22 - 32 mmol/L   Glucose, Bld 119 (H) 65 - 99 mg/dL   BUN 9 6 - 20 mg/dL   Creatinine, Ser 0.65 0.44 - 1.00 mg/dL  Calcium 9.1 8.9 - 10.3 mg/dL   Total Protein 7.3 6.5 - 8.1 g/dL   Albumin 3.7 3.5 - 5.0 g/dL   AST 19 15 - 41 U/L   ALT 26 14 - 54 U/L   Alkaline Phosphatase 60 38 - 126 U/L   Total Bilirubin 0.4 0.3 - 1.2 mg/dL   GFR calc non Af Amer >60 >60 mL/min   GFR calc Af Amer >60 >60 mL/min    Comment: (NOTE) The eGFR has been calculated using the CKD EPI equation. This calculation has not been validated in all clinical situations. eGFR's persistently <60 mL/min signify possible Chronic Kidney Disease.    Anion gap 11 5 - 15  Ethanol     Status: None   Collection Time: 02/24/17 11:52 PM  Result Value Ref Range   Alcohol, Ethyl (B) <5 <5 mg/dL    Comment:        LOWEST DETECTABLE LIMIT FOR SERUM ALCOHOL IS 5  mg/dL FOR MEDICAL PURPOSES ONLY   Salicylate level     Status: None   Collection Time: 02/24/17 11:52 PM  Result Value Ref Range   Salicylate Lvl <9.1 2.8 - 30.0 mg/dL  Acetaminophen level     Status: Abnormal   Collection Time: 02/24/17 11:52 PM  Result Value Ref Range   Acetaminophen (Tylenol), Serum <10 (L) 10 - 30 ug/mL    Comment:        THERAPEUTIC CONCENTRATIONS VARY SIGNIFICANTLY. A RANGE OF 10-30 ug/mL MAY BE AN EFFECTIVE CONCENTRATION FOR MANY PATIENTS. HOWEVER, SOME ARE BEST TREATED AT CONCENTRATIONS OUTSIDE THIS RANGE. ACETAMINOPHEN CONCENTRATIONS >150 ug/mL AT 4 HOURS AFTER INGESTION AND >50 ug/mL AT 12 HOURS AFTER INGESTION ARE OFTEN ASSOCIATED WITH TOXIC REACTIONS.   cbc     Status: Abnormal   Collection Time: 02/24/17 11:52 PM  Result Value Ref Range   WBC 10.8 (H) 4.0 - 10.5 K/uL   RBC 4.13 3.87 - 5.11 MIL/uL   Hemoglobin 10.7 (L) 12.0 - 15.0 g/dL   HCT 32.8 (L) 36.0 - 46.0 %   MCV 79.4 78.0 - 100.0 fL   MCH 25.9 (L) 26.0 - 34.0 pg   MCHC 32.6 30.0 - 36.0 g/dL   RDW 16.9 (H) 11.5 - 15.5 %   Platelets 331 150 - 400 K/uL  Ammonia     Status: None   Collection Time: 02/24/17 11:58 PM  Result Value Ref Range   Ammonia 32 9 - 35 umol/L  Valproic acid level     Status: None   Collection Time: 02/24/17 11:58 PM  Result Value Ref Range   Valproic Acid Lvl 80 50.0 - 100.0 ug/mL  Rapid urine drug screen (hospital performed)     Status: Abnormal   Collection Time: 02/25/17 12:37 AM  Result Value Ref Range   Opiates NONE DETECTED NONE DETECTED   Cocaine NONE DETECTED NONE DETECTED   Benzodiazepines NONE DETECTED NONE DETECTED   Amphetamines NONE DETECTED NONE DETECTED   Tetrahydrocannabinol RESULTS UNAVAILABLE DUE TO INTERFERING SUBSTANCE (A) NONE DETECTED   Barbiturates NONE DETECTED NONE DETECTED    Comment:        DRUG SCREEN FOR MEDICAL PURPOSES ONLY.  IF CONFIRMATION IS NEEDED FOR ANY PURPOSE, NOTIFY LAB WITHIN 5 DAYS.        LOWEST DETECTABLE  LIMITS FOR URINE DRUG SCREEN Drug Class       Cutoff (ng/mL) Amphetamine      1000 Barbiturate      200 Benzodiazepine   478 Tricyclics  300 Opiates          300 Cocaine          300 THC              50   Urinalysis, Routine w reflex microscopic     Status: Abnormal   Collection Time: 02/25/17 12:37 AM  Result Value Ref Range   Color, Urine YELLOW YELLOW   APPearance CLEAR CLEAR   Specific Gravity, Urine 1.025 1.005 - 1.030   pH 6.0 5.0 - 8.0   Glucose, UA NEGATIVE NEGATIVE mg/dL   Hgb urine dipstick NEGATIVE NEGATIVE   Bilirubin Urine NEGATIVE NEGATIVE   Ketones, ur 20 (A) NEGATIVE mg/dL   Protein, ur 30 (A) NEGATIVE mg/dL   Nitrite NEGATIVE NEGATIVE   Leukocytes, UA TRACE (A) NEGATIVE   RBC / HPF 0-5 0 - 5 RBC/hpf   WBC, UA 6-30 0 - 5 WBC/hpf   Bacteria, UA NONE SEEN NONE SEEN   Squamous Epithelial / LPF 6-30 (A) NONE SEEN   Mucus PRESENT     Current Facility-Administered Medications  Medication Dose Route Frequency Provider Last Rate Last Dose  . divalproex (DEPAKOTE ER) 24 hr tablet 500 mg  500 mg Oral BID Darleene Cleaver, Mathilde Mcwherter, MD   500 mg at 02/25/17 1405  . LORazepam (ATIVAN) tablet 1 mg  1 mg Oral BID Darleene Cleaver, Jailani Hogans, MD   1 mg at 02/25/17 1405  . metFORMIN (GLUCOPHAGE) tablet 500 mg  500 mg Oral BID WC Reda Gettis, MD      . metroNIDAZOLE (FLAGYL) tablet 500 mg  500 mg Oral BID Darleene Cleaver, Margan Elias, MD   500 mg at 02/25/17 1405  . risperiDONE (RISPERDAL) tablet 1 mg  1 mg Oral BID Darleene Cleaver, Darrly Loberg, MD   1 mg at 02/25/17 1405  . traZODone (DESYREL) tablet 100 mg  100 mg Oral QHS PRN Deziree Mokry, MD      . trihexyphenidyl (ARTANE) tablet 5 mg  5 mg Oral BID WC Jonita Hirota, MD       Current Outpatient Prescriptions  Medication Sig Dispense Refill  . benztropine (COGENTIN) 0.5 MG tablet Take 1 tablet (0.5 mg total) by mouth 2 (two) times daily in the am and at bedtime.. 60 tablet 0  . divalproex (DEPAKOTE ER) 250 MG 24 hr tablet Take 3 tablets (750 mg  total) by mouth at bedtime. 90 tablet 0  . haloperidol (HALDOL) 5 MG tablet Take 1 tablet (5 mg total) by mouth 2 (two) times daily in the am and at bedtime.. 60 tablet 0  . ibuprofen (ADVIL,MOTRIN) 200 MG tablet Take 400 mg by mouth every 6 (six) hours as needed for mild pain.    Marland Kitchen loperamide (IMODIUM A-D) 2 MG tablet Take 2 mg by mouth 4 (four) times daily as needed for diarrhea or loose stools.    . metFORMIN (GLUCOPHAGE) 500 MG tablet Take 1 tablet (500 mg total) by mouth 2 (two) times daily with a meal. 60 tablet 0  . metroNIDAZOLE (FLAGYL) 500 MG tablet Take 1 tablet (500 mg total) by mouth 2 (two) times daily. 14 tablet 0  . traZODone (DESYREL) 100 MG tablet Take 1 tablet (100 mg total) by mouth at bedtime as needed for sleep. 30 tablet 0  . haloperidol decanoate (HALDOL DECANOATE) 100 MG/ML injection Inject 1 mL (100 mg total) into the muscle every 30 (thirty) days. Next dose due 09/11/2016 (Patient not taking: Reported on 02/22/2017) 1 mL 0    Musculoskeletal: Strength & Muscle Tone:  within normal limits Gait & Station: normal Patient leans: N/A  Psychiatric Specialty Exam: Physical Exam  Constitutional: She is oriented to person, place, and time. She appears well-developed and well-nourished.  HENT:  Head: Normocephalic.  Neck: Normal range of motion.  Respiratory: Effort normal.  Musculoskeletal: Normal range of motion.  Neurological: She is alert and oriented to person, place, and time.  Psychiatric: Her speech is normal. Judgment normal. She is actively hallucinating. Cognition and memory are impaired. She exhibits a depressed mood. She expresses suicidal ideation. She expresses suicidal plans.    Review of Systems  Psychiatric/Behavioral: Positive for depression, hallucinations and suicidal ideas.  All other systems reviewed and are negative.   Blood pressure (!) 155/82, pulse 74, temperature 99 F (37.2 C), temperature source Oral, resp. rate 18, SpO2 97 %.There is no  height or weight on file to calculate BMI.  General Appearance: Casual  Eye Contact:  Fair  Speech:  Normal Rate  Volume:  Normal  Mood:  Depressed  Affect:  Congruent  Thought Process:  Coherent and Descriptions of Associations: Intact  Orientation:  Full (Time, Place, and Person)  Thought Content:  Hallucinations: Auditory Visual and Rumination  Suicidal Thoughts:  Yes.  with intent/plan  Homicidal Thoughts:  No  Memory:  Immediate;   Fair Recent;   Fair Remote;   Fair  Judgement:  Fair  Insight:  Fair  Psychomotor Activity:  Normal  Concentration:  Concentration: Fair and Attention Span: Fair  Recall:  [employer] of Knowledge:  Fair  Language:  Good  Akathisia:  No  Handed:  Right  AIMS (if indicated):     Assets:  Housing Leisure Time Physical Health Resilience Social Support  ADL's:  Intact  Cognition:  Impaired,  Mild  Sleep:        Treatment Plan Summary: Daily contact with patient to assess and evaluate symptoms and progress in treatment, Medication management and Plan bipolar affective disorder, depressed, severe with psychosis:  -Crisis stabilization -Medication management:  Started Depakote 500 mg BID for mood stabilization, Ativan 1 mg BID for anxiety and catatonic state, Rispderal 1 mg BID for psychosis, Trazodone 100 mg at bedtime PRN sleep, and ARtane 5 mg BID with meals for EPS -Individual counseling  Disposition: Recommend psychiatric Inpatient admission when medically cleared.  Waylan Boga, NP 02/25/2017 3:09 PM  Patient seen face-to-face for psychiatric evaluation, chart reviewed and case discussed with the physician extender and developed treatment plan. Reviewed the information documented and agree with the treatment plan. Corena Pilgrim, MD

## 2017-02-25 NOTE — ED Provider Notes (Addendum)
[place] [facility] DEPT Provider Note   CSN: [id number] Arrival date & time: 02/24/17  2324     History   Chief Complaint No chief complaint on file.  Level 5 caveat   HPI [name] is a 41 y.o. female with h/o IBS and bipolar disorder presents to ED from [facility] for medical clearance. Pt is inadequate historian.  She has flat affect, delayed speech and thought but AxO to self, place, time and president.  Denies SI and HI. Cannot respond to AVHs. HPI obtained from chart and conversation with [facility] NP Lindon Romp.   Briefly, pt presented to ED two days ago for abdominal pain, diarrhea, vaginal discharge and altered mental status. At that time ED work up was reassuring but was noted to have delayed speech and medical non compliance with psych medications. She was discharged with f/u with [facility] for evaluation.  She was evaluated 2 hrs PTA by Lindon Romp (psychiatry) and sent to ED for medical clearance. Spoke to [place] NP who stated pt needs medical clearance and likely inpatient psych care.   HPI   Past Medical History:  Diagnosis Date  . Abnormal Pap smear   . Bipolar 1 disorder ([place])   . Fibroids   . IBS (irritable bowel syndrome)     Patient Active Problem List   Diagnosis Date Noted  . Diabetes mellitus ([place]) 08/10/2016  . Bipolar disorder, curr episode mixed, severe, with psychotic features ([place]) 08/07/2016  . Cannabis use disorder, moderate, dependence ([place]) 08/07/2016  . CHOLECYSTITIS, UNSPECIFIED 06/12/2008  . BILIARY COLIC 32/99/2426  . CERUMEN IMPACTION, BILATERAL 01/17/2008  . PHARYNGITIS, VIRAL 01/17/2008  . NECK PAIN 01/17/2008    Past Surgical History:  Procedure Laterality Date  . CERVICAL BIOPSY    . CHOLECYSTECTOMY    . TUBAL LIGATION      OB History    Gravida Para Term Preterm AB Living   7 2 2   5 2    SAB TAB Ectopic Multiple Live Births     4 1           Home Medications    Prior to Admission medications   Medication Sig Start Date End  Date Taking? Authorizing Provider  benztropine (COGENTIN) 0.5 MG tablet Take 1 tablet (0.5 mg total) by mouth 2 (two) times daily in the am and at bedtime.. 08/14/16  Yes Kerrie Buffalo, NP  divalproex (DEPAKOTE ER) 250 MG 24 hr tablet Take 3 tablets (750 mg total) by mouth at bedtime. 08/14/16  Yes Kerrie Buffalo, NP  haloperidol (HALDOL) 5 MG tablet Take 1 tablet (5 mg total) by mouth 2 (two) times daily in the am and at bedtime.. 08/14/16  Yes Kerrie Buffalo, NP  ibuprofen (ADVIL,MOTRIN) 200 MG tablet Take 400 mg by mouth every 6 (six) hours as needed for mild pain.   Yes [provider]  loperamide (IMODIUM A-D) 2 MG tablet Take 2 mg by mouth 4 (four) times daily as needed for diarrhea or loose stools.   Yes [provider]  metFORMIN (GLUCOPHAGE) 500 MG tablet Take 1 tablet (500 mg total) by mouth 2 (two) times daily with a meal. 08/14/16  Yes Kerrie Buffalo, NP  metroNIDAZOLE (FLAGYL) 500 MG tablet Take 1 tablet (500 mg total) by mouth 2 (two) times daily. 02/22/17  Yes Khatri, Hina, PA-C  traZODone (DESYREL) 100 MG tablet Take 1 tablet (100 mg total) by mouth at bedtime as needed for sleep. 08/14/16  Yes Kerrie Buffalo, NP  haloperidol decanoate (  HALDOL DECANOATE) 100 MG/ML injection Inject 1 mL (100 mg total) into the muscle every 30 (thirty) days. Next dose due 09/11/2016 Patient not taking: Reported on 02/22/2017 08/14/16   Kerrie Buffalo, NP    Family History Family History  Problem Relation Age of Onset  . Diabetes Father   . Diabetes Paternal Grandmother   . Diabetes Maternal Grandmother   . Mental illness Cousin     Social History Social History  Substance Use Topics  . Smoking status: Current Every Day Smoker    Packs/day: 0.25    Years: 0.00    Types: Cigarettes  . Smokeless tobacco: Never Used  . Alcohol use Yes     Comment: occasional     Allergies   Patient has no known allergies.   Review of Systems Review of Systems  Unable to perform ROS:  Mental status change     Physical Exam Updated Vital Signs BP 133/75 (BP Location: Right Arm)   Pulse 78   Temp 98 F (36.7 C) (Oral)   Resp 16   LMP  (LMP Unknown)   SpO2 98%   Physical Exam  Constitutional: She is oriented to person, place, and time. She appears well-developed and well-nourished. No distress.  HENT:  Head: Normocephalic and atraumatic.  Nose: Nose normal.  Mouth/Throat: Oropharynx is clear and moist. No oropharyngeal exudate.  No lip smacking   Eyes: Pupils are equal, round, and reactive to light. Conjunctivae and EOM are normal.  PERRL and EOMs intact   Neck: Normal range of motion. Neck supple. No JVD present.  Cardiovascular: Normal rate, regular rhythm, normal heart sounds and intact distal pulses.   No murmur heard. Pulmonary/Chest: Effort normal and breath sounds normal. No respiratory distress. She has no wheezes. She has no rales. She exhibits no tenderness.  Abdominal: Soft. Bowel sounds are normal. She exhibits no distension and no mass. There is no tenderness. There is no rebound and no guarding.  Musculoskeletal: Normal range of motion. She exhibits no deformity.  Slight bilateral hand tremor  Lymphadenopathy:    She has no cervical adenopathy.  Neurological: She is alert and oriented to person, place, and time. No sensory deficit.  No cogwheel rigidity or slowed movements Normal tone to UE and LE  Skin: Skin is warm and dry. Capillary refill takes less than 2 seconds.  Psychiatric: Her affect is blunt. Her speech is delayed. She is withdrawn. Cognition and memory are normal.  Nursing note and vitals reviewed.    ED Treatments / Results  Labs (all labs ordered are listed, but only abnormal results are displayed) Labs Reviewed  COMPREHENSIVE METABOLIC PANEL - Abnormal; Notable for the following:       Result Value   Potassium 3.1 (*)    Glucose, Bld 119 (*)    All other components within normal limits  ACETAMINOPHEN LEVEL - Abnormal;  Notable for the following:    Acetaminophen (Tylenol), Serum <10 (*)    All other components within normal limits  CBC - Abnormal; Notable for the following:    WBC 10.8 (*)    Hemoglobin 10.7 (*)    HCT 32.8 (*)    MCH 25.9 (*)    RDW 16.9 (*)    All other components within normal limits  RAPID URINE DRUG SCREEN, HOSP PERFORMED - Abnormal; Notable for the following:    Tetrahydrocannabinol RESULTS UNAVAILABLE DUE TO INTERFERING SUBSTANCE (*)    All other components within normal limits  URINALYSIS, ROUTINE W REFLEX MICROSCOPIC -  Abnormal; Notable for the following:    Ketones, ur 20 (*)    Protein, ur 30 (*)    Leukocytes, UA TRACE (*)    Squamous Epithelial / LPF 6-30 (*)    All other components within normal limits  ETHANOL  SALICYLATE LEVEL  AMMONIA  VALPROIC ACID LEVEL    EKG  EKG Interpretation None       Radiology No results found.  Procedures Procedures (including critical care time)  Medications Ordered in ED Medications - No data to display   Initial Impression / Assessment and Plan / ED Course  I have reviewed the triage vital signs and the nursing notes.  Pertinent labs & imaging results that were available during my care of the patient were reviewed by me and considered in my medical decision making (see chart for details).    Pt sent from [facility] after being evaluated by Lindon Romp NP who recommends inpatient psych. Screening labs WNL. Valproic acid level within appropriate range. Pt has documented anti psychotics on her med list however it was noted she has been non compliant with psych meds.  She is unable to provide further information on med compliance. She does has slight bilateral hand tremor but no lip smacking, hypertension, hyperthermia, increased muscle tone, pill rolling tremor. Will consult TTS and psych.   Final Clinical Impressions(s) / ED Diagnoses   Final diagnoses:  Medical clearance for psychiatric admission    New  Prescriptions New Prescriptions   No medications on file     Arlean Hopping 02/25/17 [place], April, MD 02/25/17 0429    Kinnie Feil, PA-C 02/25/17 1572    Randal Buba, April, MD 02/25/17 [phone]

## 2017-02-25 NOTE — Progress Notes (Signed)
The patient arrived late for group since she had just been admitted to the hallway. Patient stated that she was pleased with the fact that she ate "banana pudding", but did not elaborate any additional details about her day. She states that she would like to work on "learning to cope" while she is in the hospital.

## 2017-02-25 NOTE — Tx Team (Signed)
Initial Treatment Plan 02/25/2017 7:38 PM Deaven Barron NIO:[id number]    PATIENT STRESSORS: Marital or family conflict Traumatic event   PATIENT STRENGTHS: Ability for insight Average or above average intelligence Capable of independent living Communication skills Motivation for treatment/growth Physical Health Supportive family/friends   PATIENT IDENTIFIED PROBLEMS: Ineffective Coping  "Be more clear on how far I can push my brain"  "Accomplish goals with a clear mind"                 DISCHARGE CRITERIA:  Ability to meet basic life and health needs Improved stabilization in mood, thinking, and/or behavior Motivation to continue treatment in a less acute level of care Need for constant or close observation no longer present  PRELIMINARY DISCHARGE PLAN: Outpatient therapy Return to previous living arrangement Return to previous work or school arrangements  PATIENT/FAMILY INVOLVEMENT: This treatment plan has been presented to and reviewed with the patient, Delories Moumouni.  The patient and family have been given the opportunity to ask questions and make suggestions.  Dustin Flock, RN 02/25/2017, 7:38 PM

## 2017-02-25 NOTE — ED Notes (Signed)
Patient discharged to [facility] via [facility] @1805 .

## 2017-02-25 NOTE — Progress Notes (Signed)
Pt was sleeping at shift change.  Pt wakes up and attends group the last few min with little to offer.  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  Pt sts she feels confused at times and disoriented.  Pt sts she feels better being here.  Pt is flat, depressed and slow.  Pt denies SI HI and AVH.  Pt denies pain or discomfort.  Pt is med compliant and cooperative.   Pt receives snack and returns to her room to sleep. Pt remains safe on unit with 15 min room checks.

## 2017-02-25 NOTE — ED Notes (Signed)
Report called to Costella Hatcher, RN at [facility].  Patient will be transferred to room 506 bed 2 after 6 pm today via [facility]

## 2017-02-26 DIAGNOSIS — F17210 Nicotine dependence, cigarettes, uncomplicated: Secondary | ICD-10-CM

## 2017-02-26 DIAGNOSIS — R413 Other amnesia: Secondary | ICD-10-CM

## 2017-02-26 DIAGNOSIS — F314 Bipolar disorder, current episode depressed, severe, without psychotic features: Secondary | ICD-10-CM

## 2017-02-26 DIAGNOSIS — R4182 Altered mental status, unspecified: Secondary | ICD-10-CM

## 2017-02-26 DIAGNOSIS — G4700 Insomnia, unspecified: Secondary | ICD-10-CM

## 2017-02-26 DIAGNOSIS — F1910 Other psychoactive substance abuse, uncomplicated: Secondary | ICD-10-CM

## 2017-02-26 DIAGNOSIS — Z630 Problems in relationship with spouse or partner: Secondary | ICD-10-CM

## 2017-02-26 MED ORDER — POTASSIUM CHLORIDE CRYS ER 20 MEQ PO TBCR
20.0000 meq | EXTENDED_RELEASE_TABLET | Freq: Two times a day (BID) | ORAL | Status: AC
  Administered 2017-02-26 – 2017-02-27 (×4): 20 meq via ORAL
  Filled 2017-02-26 (×5): qty 1

## 2017-02-26 MED ORDER — LORAZEPAM 0.5 MG PO TABS
0.5000 mg | ORAL_TABLET | Freq: Four times a day (QID) | ORAL | Status: DC | PRN
  Administered 2017-03-01 – 2017-03-05 (×3): 0.5 mg via ORAL
  Filled 2017-02-26 (×4): qty 1

## 2017-02-26 MED ORDER — TRAZODONE HCL 50 MG PO TABS
50.0000 mg | ORAL_TABLET | Freq: Every evening | ORAL | Status: DC | PRN
  Administered 2017-02-26 – 2017-02-27 (×2): 50 mg via ORAL
  Filled 2017-02-26: qty 1

## 2017-02-26 NOTE — H&P (Signed)
Psychiatric Admission Assessment Adult  Patient Identification: [name]  MRN:  [id number]  Date of Evaluation:  02/26/2017  Chief Complaint:  Bipolar with psychotic features  Principal Diagnosis: Bipolar affective disorder, depressed, severe ([place])  Diagnosis:   Patient Active Problem List   Diagnosis Date Noted  . Bipolar affective disorder, depressed, severe ([place]) [F31.4] 02/25/2017  . Diabetes mellitus ([place]) [E11.9] 08/10/2016  . Bipolar disorder, curr episode mixed, severe, with psychotic features ([place]) [F31.64] 08/07/2016  . Cannabis use disorder, moderate, dependence ([place]) [F12.20] 08/07/2016  . CHOLECYSTITIS, UNSPECIFIED [K81.9] 06/12/2008  . BILIARY COLIC [I94.85] 46/27/0350  . CERUMEN IMPACTION, BILATERAL [H61.20] 01/17/2008  . PHARYNGITIS, VIRAL [J02.9] 01/17/2008  . NECK PAIN [M54.2] 01/17/2008   ID:Ritu 41 year old female, separated, 2 children (13 and 9). The children are with her sister ( 41), and one older sister. Her mother is alive and lives in [place], and they have a good relationship. 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 She has 2 degrees a bachelors in business development and a masters in education" I didn't finish that one."    CC:I was driving my cousin from the mall and they were coming to meet him. I was shaking when he got in the car, and he made me come in and get helped. I couldn't drive the car and he had to drive the car because I was driving at 09[facility]. I was trying to do [facility] for outpatient services, and I used to get an injection. I was doing well until I had a trigger. My sister and my daughter were arguing and it triggered me and reminded me of my dad arguing. It was traumatizing to me.    History of Present Illness: [name] is an 41 y.o. separated female who presents to [facility] accompanied by her cousin, who did not participate in assessment. Pt's medical record indicates Pt has a  history of bipolar disorder with psychotic symptoms, altered mental status and thought blocking. Pt states her family encouraged her to come to [facility]. Pt says she is scheduled to receive a "haldol shot" in two days. Pt stares, has difficulty answering questions and is a poor historian. She says she feels "judged" when she is alone in her home. She says "I second guess myself" and acknowledges that she feels confused. She says she cannot concentration because "there are too many distractions." When asked if she was experiencing auditory or visual hallucinations Pt appeared to not understand the question despite it being rephrased in different ways. Pt denies current suicidal ideation. She denies thoughts of wanting to harm others. She states she has not slept well for several days. Pt reports she drinks alcohol occasionally and smokes marijuana 3-4 times per week. She denies other substance use.  Pt says she lives with her two children, ages 77 and 68. She says she is going through a divorce. 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 Pt reports she has been psychiatrically hospitalized at [facility] and [facility] but Pt cannot remember dates.  Pt is dressed in shorts and a t-shirt. She is alert, oriented to person, place and situation but not date. Speech is of slow rate. Pt has normal motor behavior. Eye contact is staring. Pt's mood is depressed and anxious; affect is blunted. Thought process is tangential and Pt appears to be experiencing thought blocking. Pt's insight is limited and judgment is impaired. Pt was generally cooperative throughout assessment.  She states she would prefer outpatient treatment.  Associated Signs/Symptoms:  Depression Symptoms: depressed mood, insomnia, psychomotor retardation, fatigue, feelings of worthlessness/guilt, difficulty concentrating, hopelessness, on the verge of crying all the time. If I see an old student I would cry.   (Hypo) Manic Symptoms:   Distractibility,  Anxiety Symptoms:  Excessive Worry, Panic Symptoms,  Psychotic Symptoms:  Admits to having vivid dreams. 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 Reminded her of her dad when he used to argue with mom.   Total Time spent with patient: 1 hour  Past Psychiatric History: Bipolar disorder,   Is the patient at risk to self? No.  Has the patient been a risk to self in the past 6 months? No.  Has the patient been a risk to self within the distant past? No.  Is the patient a risk to others? No.  Has the patient been a risk to others in the past 6 months? No.  Has the patient been a risk to others within the distant past? No.   Prior Inpatient Therapy: Yes 2 previous admissions at [facility], [place] x1, [facility] in [place] x 1 Prior Outpatient Therapy: Yes was receiving services at [facility], havent been seen in a while. She was seeing Pieter Partridge, NP at [facility]. No therapist.   Alcohol Screening: 1. How often do you have a drink containing alcohol?: 2 to 4 times a month 2. How many drinks containing alcohol do you have on a typical day when you are drinking?: 1 or 2 3. How often do you have six or more drinks on one occasion?: Never Preliminary Score: 0 9. Have you or someone else been injured as a result of your drinking?: No 10. Has a relative or friend or a doctor or another health worker been concerned about your drinking or suggested you cut down?: No Alcohol Use Disorder Identification Test Final Score (AUDIT): 2 Brief Intervention: AUDIT score less than 7 or less-screening does not suggest unhealthy drinking-brief intervention not indicated  Substance Abuse History in the last 12 months:  Yes.  (UDS negative, THC results are unavailable. )  Consequences of Substance Abuse: Medical Consequences:  Liver damage, Possible death by overdose Legal Consequences:   Arrests, jail time, Loss of driving privilege. Family Consequences:  Family discord, divorce and or separation.  Previous Psychotropic Medications: Yes (Latuda, Geodon Haldol, ABilify, Depakote, Trazodone, ZYprexa, Wellbutrin,   Psychological Evaluations: Yes   Past Medical History:  Past Medical History:  Diagnosis Date  . Abnormal Pap smear   . Bipolar 1 disorder ([place])   . Fibroids   . IBS (irritable bowel syndrome)     Past Surgical History:  Procedure Laterality Date  . CERVICAL BIOPSY    . CHOLECYSTECTOMY    . TUBAL LIGATION     Family History:  Family History  Problem Relation Age of Onset  . Diabetes Father   . Diabetes Paternal Grandmother   . Diabetes Maternal Grandmother   . Mental illness Cousin     Family Psychiatric  History: Per patient she denies.   Tobacco Screening: Have you used any form of tobacco in the last 30 days? (Cigarettes, Smokeless Tobacco, Cigars, and/or Pipes): No  Social History:  History  Alcohol Use  . Yes    Comment: occasional     History  Drug Use    Comment: CBD oil  Additional Social History:  reports that she has been smoking Cigarettes.  She has been smoking about 0.25 packs per day for the past 0.00 years. She has never used smokeless tobacco. She reports that she drinks alcohol. She reports that she uses drugs.    Allergies:  No Known Allergies  Lab Results:  Results for orders placed or performed during the hospital encounter of 02/24/17 (from the past 48 hour(s))  Comprehensive metabolic panel     Status: Abnormal   Collection Time: 02/24/17 11:52 PM  Result Value Ref Range   Sodium 140 135 - 145 mmol/L   Potassium 3.1 (L) 3.5 - 5.1 mmol/L   Chloride 106 101 - 111 mmol/L   CO2 23 22 - 32 mmol/L   Glucose, Bld 119 (H) 65 - 99 mg/dL   BUN 9 6 - 20 mg/dL   Creatinine, Ser 0.65 0.44 - 1.00 mg/dL   Calcium 9.1 8.9 - 10.3 mg/dL   Total Protein 7.3 6.5 - 8.1 g/dL   Albumin 3.7 3.5 - 5.0 g/dL   AST 19 15 - 41 U/L    ALT 26 14 - 54 U/L   Alkaline Phosphatase 60 38 - 126 U/L   Total Bilirubin 0.4 0.3 - 1.2 mg/dL   GFR calc non Af Amer >60 >60 mL/min   GFR calc Af Amer >60 >60 mL/min    Comment: (NOTE) The eGFR has been calculated using the CKD EPI equation. This calculation has not been validated in all clinical situations. eGFR's persistently <60 mL/min signify possible Chronic Kidney Disease.    Anion gap 11 5 - 15  Ethanol     Status: None   Collection Time: 02/24/17 11:52 PM  Result Value Ref Range   Alcohol, Ethyl (B) <5 <5 mg/dL    Comment:        LOWEST DETECTABLE LIMIT FOR SERUM ALCOHOL IS 5 mg/dL FOR MEDICAL PURPOSES ONLY   Salicylate level     Status: None   Collection Time: 02/24/17 11:52 PM  Result Value Ref Range   Salicylate Lvl <5.9 2.8 - 30.0 mg/dL  Acetaminophen level     Status: Abnormal   Collection Time: 02/24/17 11:52 PM  Result Value Ref Range   Acetaminophen (Tylenol), Serum <10 (L) 10 - 30 ug/mL    Comment:        THERAPEUTIC CONCENTRATIONS VARY SIGNIFICANTLY. A RANGE OF 10-30 ug/mL MAY BE AN EFFECTIVE CONCENTRATION FOR MANY PATIENTS. HOWEVER, SOME ARE BEST TREATED AT CONCENTRATIONS OUTSIDE THIS RANGE. ACETAMINOPHEN CONCENTRATIONS >150 ug/mL AT 4 HOURS AFTER INGESTION AND >50 ug/mL AT 12 HOURS AFTER INGESTION ARE OFTEN ASSOCIATED WITH TOXIC REACTIONS.   cbc     Status: Abnormal   Collection Time: 02/24/17 11:52 PM  Result Value Ref Range   WBC 10.8 (H) 4.0 - 10.5 K/uL   RBC 4.13 3.87 - 5.11 MIL/uL   Hemoglobin 10.7 (L) 12.0 - 15.0 g/dL   HCT 32.8 (L) 36.0 - 46.0 %   MCV 79.4 78.0 - 100.0 fL   MCH 25.9 (L) 26.0 - 34.0 pg   MCHC 32.6 30.0 - 36.0 g/dL   RDW 16.9 (H) 11.5 - 15.5 %   Platelets 331 150 - 400 K/uL  Ammonia     Status: None   Collection Time: 02/24/17 11:58 PM  Result Value Ref Range   Ammonia 32 9 - 35 umol/L  Valproic acid level     Status: None   Collection Time: 02/24/17 11:58 PM  Result Value Ref  Range   Valproic Acid Lvl 80 50.0  - 100.0 ug/mL  Rapid urine drug screen (hospital performed)     Status: Abnormal   Collection Time: 02/25/17 12:37 AM  Result Value Ref Range   Opiates NONE DETECTED NONE DETECTED   Cocaine NONE DETECTED NONE DETECTED   Benzodiazepines NONE DETECTED NONE DETECTED   Amphetamines NONE DETECTED NONE DETECTED   Tetrahydrocannabinol RESULTS UNAVAILABLE DUE TO INTERFERING SUBSTANCE (A) NONE DETECTED   Barbiturates NONE DETECTED NONE DETECTED    Comment:        DRUG SCREEN FOR MEDICAL PURPOSES ONLY.  IF CONFIRMATION IS NEEDED FOR ANY PURPOSE, NOTIFY LAB WITHIN 5 DAYS.        LOWEST DETECTABLE LIMITS FOR URINE DRUG SCREEN Drug Class       Cutoff (ng/mL) Amphetamine      1000 Barbiturate      200 Benzodiazepine   657 Tricyclics       846 Opiates          300 Cocaine          300 THC              50   Urinalysis, Routine w reflex microscopic     Status: Abnormal   Collection Time: 02/25/17 12:37 AM  Result Value Ref Range   Color, Urine YELLOW YELLOW   APPearance CLEAR CLEAR   Specific Gravity, Urine 1.025 1.005 - 1.030   pH 6.0 5.0 - 8.0   Glucose, UA NEGATIVE NEGATIVE mg/dL   Hgb urine dipstick NEGATIVE NEGATIVE   Bilirubin Urine NEGATIVE NEGATIVE   Ketones, ur 20 (A) NEGATIVE mg/dL   Protein, ur 30 (A) NEGATIVE mg/dL   Nitrite NEGATIVE NEGATIVE   Leukocytes, UA TRACE (A) NEGATIVE   RBC / HPF 0-5 0 - 5 RBC/hpf   WBC, UA 6-30 0 - 5 WBC/hpf   Bacteria, UA NONE SEEN NONE SEEN   Squamous Epithelial / LPF 6-30 (A) NONE SEEN   Mucus PRESENT   CBG monitoring, ED     Status: Abnormal   Collection Time: 02/25/17  5:20 PM  Result Value Ref Range   Glucose-Capillary 152 (H) 65 - 99 mg/dL   Blood Alcohol level:  Lab Results  Component Value Date   ETH <5 02/24/2017   ETH <5 96/29/5284   Metabolic Disorder Labs:  Lab Results  Component Value Date   HGBA1C 7.6 (H) 08/06/2016   MPG 171 08/06/2016   Lab Results  Component Value Date   PROLACTIN 8.5 08/06/2016   Lab Results   Component Value Date   CHOL 162 08/06/2016   TRIG 80 08/06/2016   HDL 40 (L) 08/06/2016   CHOLHDL 4.1 08/06/2016   VLDL 16 08/06/2016   LDLCALC 106 (H) 08/06/2016    Current Medications: Current Facility-Administered Medications  Medication Dose Route Frequency Provider Last Rate Last Dose  . acetaminophen (TYLENOL) tablet 650 mg  650 mg Oral Q6H PRN Patrecia Pour, NP      . alum & mag hydroxide-simeth (MAALOX/MYLANTA) 200-200-20 MG/5ML suspension 30 mL  30 mL Oral Q4H PRN Patrecia Pour, NP      . divalproex (DEPAKOTE ER) 24 hr tablet 500 mg  500 mg Oral BID Patrecia Pour, NP   500 mg at 02/25/17 2024  . LORazepam (ATIVAN) tablet 1 mg  1 mg Oral BID Patrecia Pour, NP   1 mg at 02/25/17 2024  . magnesium hydroxide (MILK OF MAGNESIA) suspension 30 mL  30  mL Oral Daily PRN Patrecia Pour, NP      . metFORMIN (GLUCOPHAGE) tablet 500 mg  500 mg Oral BID WC Patrecia Pour, NP      . metroNIDAZOLE (FLAGYL) tablet 500 mg  500 mg Oral BID Patrecia Pour, NP   500 mg at 02/25/17 2041  . risperiDONE (RISPERDAL) tablet 1 mg  1 mg Oral BID Patrecia Pour, NP   1 mg at 02/25/17 2024  . traZODone (DESYREL) tablet 100 mg  100 mg Oral QHS PRN Patrecia Pour, NP   100 mg at 02/25/17 2142  . trihexyphenidyl (ARTANE) tablet 5 mg  5 mg Oral BID WC Patrecia Pour, NP       PTA Medications: Prescriptions Prior to Admission  Medication Sig Dispense Refill Last Dose  . benztropine (COGENTIN) 0.5 MG tablet Take 1 tablet (0.5 mg total) by mouth 2 (two) times daily in the am and at bedtime.. 60 tablet 0 02/24/2017 at 9am  . divalproex (DEPAKOTE ER) 250 MG 24 hr tablet Take 3 tablets (750 mg total) by mouth at bedtime. 90 tablet 0 02/24/2017 at 9am  . haloperidol (HALDOL) 5 MG tablet Take 1 tablet (5 mg total) by mouth 2 (two) times daily in the am and at bedtime.. 60 tablet 0 02/24/2017 at 9am  . haloperidol decanoate (HALDOL DECANOATE) 100 MG/ML injection Inject 1 mL (100 mg total) into the muscle  every 30 (thirty) days. Next dose due 09/11/2016 (Patient not taking: Reported on 02/22/2017) 1 mL 0 Not Taking at Unknown time  . ibuprofen (ADVIL,MOTRIN) 200 MG tablet Take 400 mg by mouth every 6 (six) hours as needed for mild pain.   02/24/2017 at Unknown time  . loperamide (IMODIUM A-D) 2 MG tablet Take 2 mg by mouth 4 (four) times daily as needed for diarrhea or loose stools.   Past Week at Unknown time  . metFORMIN (GLUCOPHAGE) 500 MG tablet Take 1 tablet (500 mg total) by mouth 2 (two) times daily with a meal. 60 tablet 0 02/24/2017 at 9am  . metroNIDAZOLE (FLAGYL) 500 MG tablet Take 1 tablet (500 mg total) by mouth 2 (two) times daily. 14 tablet 0 02/24/2017 at Unknown time  . traZODone (DESYREL) 100 MG tablet Take 1 tablet (100 mg total) by mouth at bedtime as needed for sleep. 30 tablet 0 02/23/2017 at Unknown time   Musculoskeletal: Strength & Muscle Tone: within normal limits Gait & Station: normal Patient leans: N/A  Psychiatric Specialty Exam: Physical Exam  Nursing note and vitals reviewed. Constitutional: She is oriented to person, place, and time. She appears well-developed.  Obese  HENT:  Head: Normocephalic.  Eyes: Pupils are equal, round, and reactive to light.  Neck: Normal range of motion.  Cardiovascular:  Elevated systolic pressure & pulse rate  Respiratory: Effort normal.  GI: Soft.  Genitourinary:  Genitourinary Comments: Deferred  Musculoskeletal: Normal range of motion.  Neurological: She is alert and oriented to person, place, and time.  Skin: Skin is warm.    Review of Systems  Constitutional: Negative.   HENT: Negative.   Eyes: Negative.   Respiratory: Negative.   Cardiovascular: Negative for chest pain, palpitations, orthopnea, claudication, leg swelling and PND.       Elevated systolic pressure & pulse rate  Gastrointestinal: Negative.   Genitourinary: Negative.   Musculoskeletal: Negative.   Skin: Negative.   Neurological: Positive for dizziness.   Endo/Heme/Allergies: Negative.   Psychiatric/Behavioral: Positive for depression, memory loss (Altered mental state)  and substance abuse (dibble and dabble). The patient is nervous/anxious and has insomnia.     Blood pressure 140/84, pulse 92, temperature 98.4 F (36.9 C), temperature source Oral, resp. rate 18, height _0  (1.803 m), weight 135.2 kg (298 lb).Body mass index is 41.56 kg/m.  General Appearance:  appears confused, tearful, slow to respond to questions, thought blocking".  Eye Contact:  Minimal  Speech:  Clear, coherent, slow, thought blocking.  Volume:  Normal  Mood:  Anxious and Dysphoric  Affect:  Congruent, Constricted and Tearful  Thought Process:  Coherent and Descriptions of Associations: Tangential  Orientation:  Full (Time, Place, and Person)  Thought Content:  Paranoid Ideation, Rumination, Tangential and admits vivid dreams  Suicidal Thoughts:  Currently denies any thoughts, plans or intent.  Homicidal Thoughts:  Currently denies any thoughts, plans or intent.  Memory:  Currently oriented to self, person. However, overall memory is grossly impaired.  Judgement:  Impaired  Insight:  Lacking  Psychomotor Activity:  Excessive worrying  Concentration:  Concentration: Poor and Attention Span: Poor  Recall:  Poor  Fund of Knowledge:  Poor  Language:  Fair  Akathisia:  Negative  Handed:  Right  AIMS (if indicated):     Assets:  Social Support  ADL's:  Impaired  Cognition:  Impaired,  Moderate  Sleep:  Number of Hours: 4.75   Treatment Plan/Recommendations: 1. Admit for crisis management and stabilization, estimated length of stay 3-5 days.  2. Medication management to reduce current symptoms to base line and improve the patient's overall level of functioning: See MAR. Will obtain lipid panel, HGBA1C, Prolactin level, baseline EKG normal (qtc 441). Depakote level obtained on admission is 80 (WNL). Potassium is low at 3.1, will replace K-dur 68mq po BID x 2  days. Repeat BMP.  Started Depakote 500 mg BID for mood stabilization, Ativan 1 mg BID for anxiety and catatonic state, Rispderal 1 mg BID for psychosis, Trazodone 100 mg at bedtime PRN sleep, and ARtane 5 mg BID with meals for EPS. 3. Treat health problems as indicated.  4. Develop treatment plan to decrease risk of relapse upon discharge and the need for readmission.  5. Psycho-social education regarding relapse prevention and self care.  6. Health care follow up as needed for medical problems.  7. Review, reconcile, and reinstate any pertinent home medications for other health issues where appropriate. 8. Call for consults with hospitalist for any additional specialty patient care services as needed.  Observation Level/Precautions:  15 minute checks  Laboratory:  Labs reviewed at the ED have been assessed and reviewed. Will order additional labs.   Psychotherapy: Group sessions   Medications:  See MAR  Consultations: As needed  Discharge Concerns: Safety, mood stability  Estimated LOS: 5-7 days  Other: Admit to [facility]   Physician Treatment Plan for Primary Diagnosis: Bipolar affective disorder, depressed, severe (HCC)Will initiate medication management for mood stability. Set up an outpatient psychiatric services for medication management. Will encourage medication adherence with psychiatric medications.  Long Term Goal(s): Improvement in symptoms so as ready for discharge  Short Term Goals: Ability to identify changes in lifestyle to reduce recurrence of condition will improve, Ability to verbalize feelings will improve, Ability to disclose and discuss suicidal ideas and Ability to identify and develop effective coping behaviors will improve  Physician Treatment Plan for Secondary Diagnosis: Principal Problem:   Bipolar affective disorder, current episode manic with psychotic symptoms ([place] Active Problems:   Affective psychosis, bipolar ([place]  Long Term Goal(s): Improvement  in  symptoms so as ready for discharge  Short Term Goals: Ability to identify and develop effective coping behaviors will improve, Compliance with prescribed medications will improve and Ability to identify triggers associated with substance abuse/mental health issues will improve  I certify that inpatient services furnished can reasonably be expected to improve the patient's condition.    Nanci Pina, FNP, 8/27/20187:48 AM   I have reviewed case with NP and have met with patient Agree with NP Assessment  41 year old female . She is separated , has two children ( 13,9) , she is employed as a Optometrist . She has history of Bipolar Disorder , and chart notes indicate history of psychotic symptoms. She had a prior psychiatric admission to [facility] on February 2018 for similar symptoms as current-at the time was diagnosed with Bipolar Disorder Mixed, with psychotic features. She was discharged on Depakote ER, Haldol PO, Haldol Decanoate IM monthly, Trazodone  She presented to hospital accompanied by cousin due to depression, psychotic symptoms in the context of severe psychosocial stressors, including divorce, custody and financial concerns . Notes indicate that patient presented as poor historian, appearing confused, and responding to internal stimuli. At this time patient is partially improved, and compared with presenting description she is presenting better organized and better able to communicate. She states " I am not really sure what happened, I know I seemed  confused ". States she had a panic attack while driving, which prompted her cousin to bring her to the hospital. She does state she has been feeling depressed recently , and endorses some anhedonia and some passive SI, although denies any plan or intention of suicide. She denies hallucinations, but states she has been having " vivid dreams,so that when I wake up I feel it really happened". She states she has been taking prescribed  medications , except  " shot " ( haldol decanoate)  that she has not been taking for months . She denies any drug or alcohol abuse   Dx- Bipolar Disorder Depressed, with Psychotic Features   Plan- inpatient admission- continue Depakote ER 500 gmrs BID, Risperidone 1 mgr BID, Trazodone 50 mgrs QHS PRN , D/C Artane as no current EPS and to minimize potential side effects , change Ativan to 0.5 mgrs Q 6 hours PRN for anxiety as needed
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
## 2017-02-26 NOTE — Progress Notes (Signed)
Recreation Therapy Notes  Date: 02/26/17 Time: 1015 Location: 500 Hall Dayroom  Group Topic: Life Goals, Goal Setting  Goal Area(s) Addresses:  Patient will be able to identify at least 3 life goals.  Patient will be able to identify benefit of investing in life goals.  Patient will be able to identify benefit of setting life goals.   Behavioral Response:  Engaged  Intervention: Goal sheet, pencils  Activity: Life Goals.  Patients were given a worksheet that identified six areas (family, friends, spirituality, work/school, body and mental health).  Patients were to identify the things they do well, what they needed to improve and set a goal to make the improvement.  Patients would then pick their top three categories to share with the group.  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  As it pertains to work, pt stated she does well with self discipline to follow rules and deadlines, needs to improve being loyal and set a goal of never stop learning.  Pt expressed she does well with talking through things when it comes to mental health, needs to improve setting up systems to remedy problems and set a goal of seeing how long she can live mental illness by following the proper procedures.   Victorino Sparrow, LRT/CTRS         Victorino Sparrow A 02/26/2017 12:18 PM

## 2017-02-26 NOTE — Plan of Care (Signed)
Problem: Safety: Goal: Periods of time without injury will increase Outcome: Progressing Pt denies SI/HI/AVH at this time, remains a low fall risk.

## 2017-02-26 NOTE — BHH Group Notes (Signed)
[facility] Association Group Therapy  02/26/2017 , 2:07 PM    Type of Therapy:  Mental Health Association Presentation  Participation Level:  Active  Participation Quality:  Attentive  Affect:  Blunted  Cognitive:  Oriented  Insight:  Limited  Engagement in Therapy:  Engaged  Modes of Intervention:  Discussion, Education and Socialization  Summary of Progress/Problems:  [name]  from [place] came to present her recovery story, encourage group  members to share something about their story, and present information about the MHA.  Stayed the entire time, engaged throughout.  Shared her coping skill at the end, and thanked the speaker for coming.  [name] 02/26/2017 , 2:07 PM

## 2017-02-26 NOTE — Progress Notes (Signed)
Pt requested staff to go into locker to retreive wallet for family members (daugher and sister) to bring home. Security was present. See belonging sheet for details.

## 2017-02-26 NOTE — Progress Notes (Signed)
Adult Psychoeducational Group Note  Date:  02/26/2017 Time:  9:28 PM  Group Topic/Focus:  Wrap-Up Group:   The focus of this group is to help patients review their daily goal of treatment and discuss progress on daily workbooks.  Participation Level:  Active  Participation Quality:  Appropriate  Affect:  Appropriate  Cognitive:  Appropriate  Insight: Appropriate  Engagement in Group:  Engaged  Modes of Intervention:  Discussion  Additional Comments:  The patient expressed that she  rates today a 10.The patient also said that she atended goals group.  Nash Shearer 02/26/2017, 9:28 PM

## 2017-02-26 NOTE — Progress Notes (Signed)
Patient ID: Charlene Detter, female   DOB: 1975-08-01, 41 y.o.   MRN: [id number] D) Pt has been wide eyed or blank in affect. Pt has been thought blocking and is slow to respond. Pt is cooperative on approach. Positive for unit activities with minimal prompting. Pt has been out in the milieu throughout this shift. Sinclair did c/o feeling "groggy" this a.m. possibly due to taking meds in the morning as she says she takes home meds at [facility]. Pt has been compliant with medications. Appetite good. A) level 3 obs for safety, support and encouragement provided. Med ed reinforced. Prompts as needed. R) Cooperative.
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
## 2017-02-26 NOTE — Progress Notes (Addendum)
  DATA ACTION RESPONSE  Objective- Pt. is visible in the dayroom, seen eating a snack.Presents with an animated/anxious affect and mood. Appropriate with interaction.C/o of insomnia this evening.  Subjective- Denies having any SI/HI/AVH/Pain at this time.Pt. states "I'm doing well, I had a good visit with my family". Is cooperative and remain safe and pleasant on the unit.  1:1 interaction in private to establish rapport. Encouragement, education, & support given from staff.  PRN Trazodone requested and will re-eval accordingly.   Safety maintained with Q 15 checks. Continue with POC.

## 2017-02-27 DIAGNOSIS — E876 Hypokalemia: Secondary | ICD-10-CM

## 2017-02-27 DIAGNOSIS — R450 Nervousness: Secondary | ICD-10-CM

## 2017-02-27 DIAGNOSIS — F419 Anxiety disorder, unspecified: Secondary | ICD-10-CM

## 2017-02-27 DIAGNOSIS — F39 Unspecified mood [affective] disorder: Secondary | ICD-10-CM

## 2017-02-27 DIAGNOSIS — Z818 Family history of other mental and behavioral disorders: Secondary | ICD-10-CM

## 2017-02-27 DIAGNOSIS — F3164 Bipolar disorder, current episode mixed, severe, with psychotic features: Principal | ICD-10-CM

## 2017-02-27 LAB — GLUCOSE, CAPILLARY: Glucose-Capillary: 120 mg/dL — ABNORMAL HIGH (ref 65–99)

## 2017-02-27 LAB — HEMOGLOBIN A1C
Hgb A1c MFr Bld: 7.5 % — ABNORMAL HIGH (ref 4.8–5.6)
Mean Plasma Glucose: 168.55 mg/dL

## 2017-02-27 LAB — TSH: TSH: 1.944 u[IU]/mL (ref 0.350–4.500)

## 2017-02-27 MED ORDER — DIVALPROEX SODIUM 500 MG PO DR TAB
500.0000 mg | DELAYED_RELEASE_TABLET | Freq: Two times a day (BID) | ORAL | Status: DC
  Administered 2017-02-27 – 2017-03-05 (×12): 500 mg via ORAL
  Filled 2017-02-27 (×14): qty 1

## 2017-02-27 MED ORDER — OLANZAPINE 10 MG IM SOLR: 5.0000 mg | Freq: Three times a day (TID) | INTRAMUSCULAR | Status: DC | PRN

## 2017-02-27 MED ORDER — OLANZAPINE 5 MG PO TBDP
5.0000 mg | ORAL_TABLET | Freq: Three times a day (TID) | ORAL | Status: DC | PRN
  Administered 2017-02-27: 5 mg via ORAL
  Filled 2017-02-27: qty 1

## 2017-02-27 NOTE — BHH Group Notes (Signed)
LCSW Group Therapy Note  02/27/2017 1:15pm  Type of Therapy and Topic:  Group Therapy: Avoiding Self-Sabotaging and Enabling Behaviors  Participation Level:  Active   Description of Group:   In this group, patients will learn how to identify obstacles, self-sabotaging and enabling behaviors, as well as: what are they, why do we do them and what needs these behaviors meet. Discuss unhealthy relationships and how to have positive healthy boundaries with those that sabotage and enable. Explore aspects of self-sabotage and enabling in yourself and how to limit these self-destructive behaviors in everyday life.   Therapeutic Goals: 1. Patient will identify one obstacle that relates to self-sabotage and enabling behaviors 2. Patient will identify one personal self-sabotaging or enabling behavior they did prior to admission 3. Patient will state a plan to change the above identified behavior 4. Patient will demonstrate ability to communicate their needs through discussion and/or role play.   Summary of Patient Progress:  "I stopped taking medication due to circumstances beyond my control.  Is that self-sabotoge?"  Admitted that she was doing better on meds than she is now, and that there is a lesson to be learned here. Went on to identify positive self talk as a way of staying positive, as well as finding commonalities with others in chat rooms.  Shades of paranoia continue to be evident in her speech.  Therapeutic Modalities:   Cognitive Behavioral Therapy Person-Centered Therapy Motivational Interviewing   Trish Mage,  02/27/2017 2:37 PM

## 2017-02-27 NOTE — Progress Notes (Signed)
Recreation Therapy Notes  Date: 02/27/17 Time: 1000 Location: 500 Hall Dayroom  Group Topic: Self-Esteem  Goal Area(s) Addresses:  Patient will successfully identify positive attributes about themselves.  Patient will successfully identify benefit of improved self-esteem.   Behavioral Response: Minimal  Intervention: Markers, colored pencils, blank crest  Activity: Crest of Arms.  Patients were given a blank crest divided into four sections.  Patients were to highlight things that were important to them or that had special meaning to them such as biggest accomplishment, proudest moment, favorite activity, something I'm good at, etc.  Education:  Self-Esteem, Dentist.   Education Outcome: Acknowledges education/In group clarification offered/Needs additional education  Clinical Observations/Feedback: Pt sat quietly and filled out her sheet.  Pt left early with doctor and did not return.    Victorino Sparrow, LRT/CTRS         Ria Comment, Javian Nudd A 02/27/2017 11:20 AM

## 2017-02-27 NOTE — Progress Notes (Signed)
[facility] MD Progress Note  02/27/2017 12:36 PM [name]  MRN:  [id number] Subjective:  Patient states " I am anxious and excited."  Objective:Patient seen and chart reviewed.Discussed patient with treatment team. Pt today seen as delayed , has pressured tangential speech , is tangential . Per RN , she is compliant with medications, continues to need support. Pt denies ADRs to medications.      Principal Problem: Bipolar disorder, curr episode mixed, severe, with psychotic features ([place]) Diagnosis:   Patient Active Problem List   Diagnosis Date Noted  . Diabetes mellitus ([place]) [E11.9] 08/10/2016  . Bipolar disorder, curr episode mixed, severe, with psychotic features ([place]) [F31.64] 08/07/2016  . Cannabis use disorder, moderate, dependence ([place]) [F12.20] 08/07/2016  . CHOLECYSTITIS, UNSPECIFIED [K81.9] 06/12/2008  . BILIARY COLIC [M19.62] 22/97/9892  . CERUMEN IMPACTION, BILATERAL [H61.20] 01/17/2008  . PHARYNGITIS, VIRAL [J02.9] 01/17/2008  . NECK PAIN [M54.2] 01/17/2008   Total Time spent with patient: 25 minutes  Past Psychiatric History: Please see H&P.   Past Medical History:  Past Medical History:  Diagnosis Date  . Abnormal Pap smear   . Bipolar 1 disorder ([place])   . Fibroids   . IBS (irritable bowel syndrome)     Past Surgical History:  Procedure Laterality Date  . CERVICAL BIOPSY    . CHOLECYSTECTOMY    . TUBAL LIGATION     Family History:  Family History  Problem Relation Age of Onset  . Diabetes Father   . Diabetes Paternal Grandmother   . Diabetes Maternal Grandmother   . Mental illness Cousin    Family Psychiatric  History: Please see H&P.  Social History: Please see H&P.  History  Alcohol Use  . Yes    Comment: occasional     History  Drug Use    Comment: CBD oil    Social History   Social History  . Marital status: Married    Spouse name: N/A  . Number of children: N/A  . Years of education: N/A   Social History Main Topics  .  Smoking status: Current Every Day Smoker    Packs/day: 0.25    Years: 0.00    Types: Cigarettes  . Smokeless tobacco: Never Used  . Alcohol use Yes     Comment: occasional  . Drug use: Yes     Comment: CBD oil  . Sexual activity: Not Currently   Other Topics Concern  . None   Social History Narrative  . None   Additional Social History:                         Sleep: Fair  Appetite:  Fair  Current Medications: Current Facility-Administered Medications  Medication Dose Route Frequency Provider Last Rate Last Dose  . acetaminophen (TYLENOL) tablet 650 mg  650 mg Oral Q6H PRN Patrecia Pour, NP   650 mg at 02/26/17 0851  . alum & mag hydroxide-simeth (MAALOX/MYLANTA) 200-200-20 MG/5ML suspension 30 mL  30 mL Oral Q4H PRN Patrecia Pour, NP      . divalproex (DEPAKOTE) DR tablet 500 mg  500 mg Oral Q12H Baneza Bartoszek, MD      . LORazepam (ATIVAN) tablet 0.5 mg  0.5 mg Oral Q6H PRN Cobos, Fernando A, MD      . magnesium hydroxide (MILK OF MAGNESIA) suspension 30 mL  30 mL Oral Daily PRN Patrecia Pour, NP      . metFORMIN (GLUCOPHAGE) tablet 500 mg  500 mg Oral BID WC Patrecia Pour, NP   500 mg at 02/27/17 0810  . metroNIDAZOLE (FLAGYL) tablet 500 mg  500 mg Oral BID Patrecia Pour, NP   500 mg at 02/27/17 0810  . OLANZapine zydis (ZYPREXA) disintegrating tablet 5 mg  5 mg Oral TID PRN Ursula Alert, MD       Or  . OLANZapine (ZYPREXA) injection 5 mg  5 mg Intramuscular TID PRN Xander Jutras, MD      . potassium chloride SA (K-DUR,KLOR-CON) CR tablet 20 mEq  20 mEq Oral BID Nanci Pina, FNP   20 mEq at 02/27/17 0810  . risperiDONE (RISPERDAL) tablet 1 mg  1 mg Oral BID Patrecia Pour, NP   1 mg at 02/27/17 0810  . traZODone (DESYREL) tablet 50 mg  50 mg Oral QHS PRN Cobos, Myer Peer, MD   50 mg at 02/26/17 2234    Lab Results:  Results for orders placed or performed during the hospital encounter of 02/25/17 (from the past 48 hour(s))  TSH      Status: None   Collection Time: 02/27/17  6:25 AM  Result Value Ref Range   TSH 1.944 0.350 - 4.500 uIU/mL    Comment: Performed by a 3rd Generation assay with a functional sensitivity of <=0.01 uIU/mL. Performed at [facility], [place] [address], [place], [place] [address]   Hemoglobin A1c     Status: Abnormal   Collection Time: 02/27/17  6:25 AM  Result Value Ref Range   Hgb A1c MFr Bld 7.5 (H) 4.8 - 5.6 %    Comment: (NOTE) Pre diabetes:          5.7%-6.4% Diabetes:              >6.4% Glycemic control for   <7.0% adults with diabetes    Mean Plasma Glucose 168.55 mg/dL    Comment: Performed at [facility] [address] [place], [place] [address]    Blood Alcohol level:  Lab Results  Component Value Date   [facility] <5 02/24/2017   ETH <5 08/67/6195    Metabolic Disorder Labs: Lab Results  Component Value Date   HGBA1C 7.5 (H) 02/27/2017   MPG 168.55 02/27/2017   MPG 171 08/06/2016   Lab Results  Component Value Date   PROLACTIN 8.5 08/06/2016   Lab Results  Component Value Date   CHOL 162 08/06/2016   TRIG 80 08/06/2016   HDL 40 (L) 08/06/2016   CHOLHDL 4.1 08/06/2016   VLDL 16 08/06/2016   LDLCALC 106 (H) 08/06/2016    Physical Findings: AIMS: Facial and Oral Movements Muscles of Facial Expression: None, normal Lips and Perioral Area: None, normal Jaw: None, normal Tongue: None, normal,Extremity Movements Upper (arms, wrists, hands, fingers): None, normal Lower (legs, knees, ankles, toes): None, normal, Trunk Movements Neck, shoulders, hips: None, normal, Overall Severity Severity of abnormal movements (highest score from questions above): None, normal Incapacitation due to abnormal movements: None, normal Patient's awareness of abnormal movements (rate only patient's report): No Awareness, Dental Status Current problems with teeth and/or dentures?: No Does patient usually wear dentures?: No  CIWA:    COWS:      Musculoskeletal: Strength & Muscle Tone: within normal limits Gait & Station: normal Patient leans: N/A  Psychiatric Specialty Exam: Physical Exam  Nursing note and vitals reviewed.   Review of Systems  Psychiatric/Behavioral: Positive for depression. The patient is nervous/anxious.   All other systems reviewed and  are negative.   Blood pressure 136/89, pulse (!) 109, temperature 98.8 F (37.1 C), temperature source Oral, resp. rate 20, height 5\' 11"  (1.803 m), weight 135.2 kg (298 lb).Body mass index is 41.56 kg/m.  General Appearance: Guarded  Eye Contact:  Fair  Speech:  Pressured  Volume:  Decreased  Mood:  Anxious and Euphoric  Affect:  Constricted  Thought Process:  Disorganized, Irrelevant and Descriptions of Associations: Tangential  Orientation:  Full (Time, Place, and Person)  Thought Content:  Delusions, Paranoid Ideation, Rumination and Tangential  Suicidal Thoughts:  No  Homicidal Thoughts:  No  Memory:  Immediate;   Fair Recent;   Fair Remote;   Fair  Judgement:  Impaired  Insight:  Shallow  Psychomotor Activity:  Normal  Concentration:  Concentration: Fair and Attention Span: Fair  Recall:  [employer] of Knowledge:  Fair  Language:  Fair  Akathisia:  No  Handed:  Right  AIMS (if indicated):     Assets:  Desire for Improvement  ADL's:  Intact  Cognition:  WNL  Sleep:  Number of Hours: 6.25     Treatment Plan Summary:Patient with bipolar do , presented as psychotic , mixed features , reports euphoria as well as anxiety , has delayed thought process , and at times pressured speech. Continue medications and observe on the unit. Daily contact with patient to assess and evaluate symptoms and progress in treatment, Medication management and Plan see below  Depakote DR 500 mg po bid for mood sx. Depakote level in 4 days. Risperidone 1 mg po bid for psychosis/mood sx. Trazodone 50 mg po qhs prn for sleep. Kdur 20 meq po bid for hypokalemia. Continue  home medications where indicated. Continue Flagyl as scheduled per [facility] for UTI. Repeat BMP for K+ - low , will also get CBC repeat since Hb- low , could get iron panel, metabolic panel if not already done. Reviewed EKG - qtc - wnl. CSW will continue to work on disposition.   Desyre Calma, MD 02/27/2017, 12:36 PM

## 2017-02-27 NOTE — Progress Notes (Signed)
Patient attended group and said that her day was a 7.  Patient said that she was confused during the first part of the day, but she is doing much better now.

## 2017-02-27 NOTE — Tx Team (Signed)
Interdisciplinary Treatment and Diagnostic Plan Update  02/27/2017 Time of Session: 8:21 AM  Nohemy Koop MRN: [id number]  Principal Diagnosis: Bipolar affective disorder, depressed, severe ([place])  Secondary Diagnoses: Principal Problem:   Bipolar affective disorder, depressed, severe ([place])   Current Medications:  Current Facility-Administered Medications  Medication Dose Route Frequency Provider Last Rate Last Dose  . acetaminophen (TYLENOL) tablet 650 mg  650 mg Oral Q6H PRN Patrecia Pour, NP   650 mg at 02/26/17 0851  . alum & mag hydroxide-simeth (MAALOX/MYLANTA) 200-200-20 MG/5ML suspension 30 mL  30 mL Oral Q4H PRN Patrecia Pour, NP      . divalproex (DEPAKOTE ER) 24 hr tablet 500 mg  500 mg Oral BID Patrecia Pour, NP   500 mg at 02/27/17 0258  . LORazepam (ATIVAN) tablet 0.5 mg  0.5 mg Oral Q6H PRN Cobos, Fernando A, MD      . magnesium hydroxide (MILK OF MAGNESIA) suspension 30 mL  30 mL Oral Daily PRN Patrecia Pour, NP      . metFORMIN (GLUCOPHAGE) tablet 500 mg  500 mg Oral BID WC Patrecia Pour, NP   500 mg at 02/27/17 0810  . metroNIDAZOLE (FLAGYL) tablet 500 mg  500 mg Oral BID Patrecia Pour, NP   500 mg at 02/27/17 0810  . potassium chloride SA (K-DUR,KLOR-CON) CR tablet 20 mEq  20 mEq Oral BID Nanci Pina, FNP   20 mEq at 02/27/17 0810  . risperiDONE (RISPERDAL) tablet 1 mg  1 mg Oral BID Patrecia Pour, NP   1 mg at 02/27/17 0810  . traZODone (DESYREL) tablet 50 mg  50 mg Oral QHS PRN Cobos, Myer Peer, MD   50 mg at 02/26/17 2234    PTA Medications: Prescriptions Prior to Admission  Medication Sig Dispense Refill Last Dose  . benztropine (COGENTIN) 0.5 MG tablet Take 1 tablet (0.5 mg total) by mouth 2 (two) times daily in the am and at bedtime.. 60 tablet 0 02/24/2017 at 9am  . divalproex (DEPAKOTE ER) 250 MG 24 hr tablet Take 3 tablets (750 mg total) by mouth at bedtime. 90 tablet 0 02/24/2017 at 9am  . haloperidol (HALDOL) 5 MG tablet Take 1 tablet (5  mg total) by mouth 2 (two) times daily in the am and at bedtime.. 60 tablet 0 02/24/2017 at 9am  . haloperidol decanoate (HALDOL DECANOATE) 100 MG/ML injection Inject 1 mL (100 mg total) into the muscle every 30 (thirty) days. Next dose due 09/11/2016 (Patient not taking: Reported on 02/22/2017) 1 mL 0 Not Taking at Unknown time  . ibuprofen (ADVIL,MOTRIN) 200 MG tablet Take 400 mg by mouth every 6 (six) hours as needed for mild pain.   02/24/2017 at Unknown time  . loperamide (IMODIUM A-D) 2 MG tablet Take 2 mg by mouth 4 (four) times daily as needed for diarrhea or loose stools.   Past Week at Unknown time  . metFORMIN (GLUCOPHAGE) 500 MG tablet Take 1 tablet (500 mg total) by mouth 2 (two) times daily with a meal. 60 tablet 0 02/24/2017 at 9am  . metroNIDAZOLE (FLAGYL) 500 MG tablet Take 1 tablet (500 mg total) by mouth 2 (two) times daily. 14 tablet 0 02/24/2017 at Unknown time  . traZODone (DESYREL) 100 MG tablet Take 1 tablet (100 mg total) by mouth at bedtime as needed for sleep. 30 tablet 0 02/23/2017 at Unknown time    Patient Stressors: Marital or family conflict Traumatic event  Patient Strengths: Ability for  insight Average or above average intelligence Capable of independent living Communication skills Motivation for treatment/growth Physical Health Supportive family/friends  Treatment Modalities: Medication Management, Group therapy, Case management,  1 to 1 session with clinician, Psychoeducation, Recreational therapy.   Physician Treatment Plan for Primary Diagnosis: Bipolar affective disorder, depressed, severe ([place]) Long Term Goal(s): Improvement in symptoms so as ready for discharge  Short Term Goals: Ability to identify changes in lifestyle to reduce recurrence of condition will improve Ability to verbalize feelings will improve Ability to disclose and discuss suicidal ideas Ability to identify and develop effective coping behaviors will improve Ability to identify and  develop effective coping behaviors will improve Compliance with prescribed medications will improve Ability to identify triggers associated with substance abuse/mental health issues will improve  Medication Management: Evaluate patient's response, side effects, and tolerance of medication regimen.  Therapeutic Interventions: 1 to 1 sessions, Unit Group sessions and Medication administration.  Evaluation of Outcomes: Progressing  Physician Treatment Plan for Secondary Diagnosis: Principal Problem:   Bipolar affective disorder, depressed, severe ([place])   Long Term Goal(s): Improvement in symptoms so as ready for discharge  Short Term Goals: Ability to identify changes in lifestyle to reduce recurrence of condition will improve Ability to verbalize feelings will improve Ability to disclose and discuss suicidal ideas Ability to identify and develop effective coping behaviors will improve Ability to identify and develop effective coping behaviors will improve Compliance with prescribed medications will improve Ability to identify triggers associated with substance abuse/mental health issues will improve  Medication Management: Evaluate patient's response, side effects, and tolerance of medication regimen.  Therapeutic Interventions: 1 to 1 sessions, Unit Group sessions and Medication administration.  Evaluation of Outcomes: Progressing   RN Treatment Plan for Primary Diagnosis: Bipolar affective disorder, depressed, severe ([place]) Long Term Goal(s): Knowledge of disease and therapeutic regimen to maintain health will improve  Short Term Goals: Ability to identify and develop effective coping behaviors will improve and Compliance with prescribed medications will improve  Medication Management: RN will administer medications as ordered by provider, will assess and evaluate patient's response and provide education to patient for prescribed medication. RN will report any adverse and/or side  effects to prescribing provider.  Therapeutic Interventions: 1 on 1 counseling sessions, Psychoeducation, Medication administration, Evaluate responses to treatment, Monitor vital signs and CBGs as ordered, Perform/monitor CIWA, COWS, AIMS and Fall Risk screenings as ordered, Perform wound care treatments as ordered.  Evaluation of Outcomes: Progressing   LCSW Treatment Plan for Primary Diagnosis: Bipolar affective disorder, depressed, severe ([place]) Long Term Goal(s): Safe transition to appropriate next level of care at discharge, Engage patient in therapeutic group addressing interpersonal concerns.  Short Term Goals: Engage patient in aftercare planning with referrals and resources  Therapeutic Interventions: Assess for all discharge needs, 1 to 1 time with Social worker, Explore available resources and support systems, Assess for adequacy in community support network, Educate family and significant other(s) on suicide prevention, Complete Psychosocial Assessment, Interpersonal group therapy.  Evaluation of Outcomes: Met  Return home, follow up outpt clinic   Progress in Treatment: Attending groups: Yes Participating in groups: Yes Taking medication as prescribed: Yes Toleration medication: Yes, no side effects reported at this time Family/Significant other contact made:  Patient understands diagnosis: Yes AEB Discussing patient identified problems/goals with staff: Yes Medical problems stabilized or resolved: Yes Denies suicidal/homicidal ideation: Yes Issues/concerns per patient self-inventory: None Other: N/A  New problem(s) identified: None identified at this time.   New Short Term/Long Term Goal(s):"Self  discipline to take my meds when prescribed. Can I get them all prescribed at bedtime so I can remember more easily?"  Discharge Plan or Barriers:   Reason for Continuation of Hospitalization: Disorganization Thought blocking Medication stabilization   Estimated Length  of Stay: 03/02/17  Attendees: Patient: 02/27/2017  8:21 AM  Physician: Ursula Alert, MD 02/27/2017  8:21 AM  Nursing: Sena Hitch, RN 02/27/2017  8:21 AM  RN Care Manager: Lars Pinks, RN 02/27/2017  8:21 AM  Social Worker: Ripley Fraise 02/27/2017  8:21 AM  Recreational Therapist: Winfield Cunas 02/27/2017  8:21 AM  Other: Norberto Sorenson 02/27/2017  8:21 AM  Other:  02/27/2017  8:21 AM    Scribe for Treatment Team:  Roque Lias LCSW 02/27/2017 8:21 AM

## 2017-02-27 NOTE — Progress Notes (Signed)
DAR NOTE: Patient presents with anxious affect and depressed mood. Pt appeared more confused to day than yesterday, when asked a question pt took her time before answering and appeared not to understand. Denies pain, auditory and visual hallucinations.  Rates depression at 7, hopelessness at 0, and anxiety at 5.  Maintained on routine safety checks.  Medications given as prescribed.  Support and encouragement offered as needed.  Attended group and participated.  States goal for today is " following the routine but don't get discouraged when I mess up."  Will continue to monitor.

## 2017-02-27 NOTE — Progress Notes (Signed)
Recreation Therapy Notes  INPATIENT RECREATION THERAPY ASSESSMENT  Patient Details Name: [name] MRN: [id number] DOB: March 05, 1976 Today's Date: 02/27/2017  Patient Stressors: Family, Other (Comment) ("Me", "It's my fault I'm here")  Pt stated she was here because she was acting anxious and making bad decisions.  Coping Skills:   Arguments, Avoidance, Exercise, Art/Dance, Talking, Music, Sports  Personal Challenges: Anger, Decision-Making, Problem-Solving, Relationships, Self-Esteem/Confidence  Leisure Interests (2+):  Social - Friends, Individual - Phone, Individual - Other (Comment) Lacinda Axon)  Awareness of Community Resources:  Yes  Community Resources:   (Shopping center)  Current Use: Yes  Patient Strengths:  People person, help people  Patient Identified Areas of Improvement:  Stop making bad decisions; getting frustrated when little things go wrong  Current Recreation Participation:  3 x a week  Patient Goal for Hospitalization:  "Get better and be back at work"  [place] of Residence:  [place] of Residence:  [place]  Current SI (including self-harm):  No  Current HI:  No  Consent to Intern Participation: N/A   Victorino Sparrow, LRT/CTRS  Victorino Sparrow A 02/27/2017, 2:57 PM

## 2017-02-28 LAB — PROLACTIN: Prolactin: 112.8 ng/mL — ABNORMAL HIGH (ref 4.8–23.3)

## 2017-02-28 LAB — CBC WITH DIFFERENTIAL/PLATELET
Basophils Absolute: 0 10*3/uL (ref 0.0–0.1)
Basophils Relative: 0 %
Eosinophils Absolute: 0.2 10*3/uL (ref 0.0–0.7)
Eosinophils Relative: 2 %
HCT: 33.8 % — ABNORMAL LOW (ref 36.0–46.0)
Hemoglobin: 11.1 g/dL — ABNORMAL LOW (ref 12.0–15.0)
Lymphocytes Relative: 22 %
Lymphs Abs: 2 10*3/uL (ref 0.7–4.0)
MCH: 25.9 pg — ABNORMAL LOW (ref 26.0–34.0)
MCHC: 32.8 g/dL (ref 30.0–36.0)
MCV: 78.8 fL (ref 78.0–100.0)
Monocytes Absolute: 0.9 10*3/uL (ref 0.1–1.0)
Monocytes Relative: 9 %
Neutro Abs: 6 10*3/uL (ref 1.7–7.7)
Neutrophils Relative %: 67 %
Platelets: 339 10*3/uL (ref 150–400)
RBC: 4.29 MIL/uL (ref 3.87–5.11)
RDW: 16.8 % — ABNORMAL HIGH (ref 11.5–15.5)
WBC: 9.1 10*3/uL (ref 4.0–10.5)

## 2017-02-28 LAB — LIPID PANEL
Cholesterol: 148 mg/dL (ref 0–200)
HDL: 36 mg/dL — ABNORMAL LOW (ref >40)
LDL Cholesterol: 89 mg/dL (ref 0–99)
Total CHOL/HDL Ratio: 4.1 RATIO
Triglycerides: 116 mg/dL (ref <150)
VLDL: 23 mg/dL (ref 0–40)

## 2017-02-28 LAB — BASIC METABOLIC PANEL
Anion gap: 10 (ref 5–15)
BUN: 12 mg/dL (ref 6–20)
CO2: 25 mmol/L (ref 22–32)
Calcium: 9.3 mg/dL (ref 8.9–10.3)
Chloride: 103 mmol/L (ref 101–111)
Creatinine, Ser: 0.74 mg/dL (ref 0.44–1.00)
GFR calc Af Amer: >60 mL/min (ref >60)
GFR calc non Af Amer: >60 mL/min (ref >60)
Glucose, Bld: 148 mg/dL — ABNORMAL HIGH (ref 65–99)
Potassium: 4.4 mmol/L (ref 3.5–5.1)
Sodium: 138 mmol/L (ref 135–145)

## 2017-02-28 LAB — IRON AND TIBC
Iron: 37 ug/dL (ref 28–170)
Saturation Ratios: 8 % — ABNORMAL LOW (ref 10.4–31.8)
TIBC: 441 ug/dL (ref 250–450)
UIBC: 404 ug/dL

## 2017-02-28 LAB — FERRITIN: Ferritin: 8 ng/mL — ABNORMAL LOW (ref 11–307)

## 2017-02-28 LAB — GLUCOSE, CAPILLARY: Glucose-Capillary: 142 mg/dL — ABNORMAL HIGH (ref 65–99)

## 2017-02-28 MED ORDER — RISPERIDONE 1 MG PO TABS
1.0000 mg | ORAL_TABLET | Freq: Every day | ORAL | Status: DC
  Filled 2017-02-28: qty 1

## 2017-02-28 MED ORDER — RISPERIDONE 1 MG PO TABS
1.5000 mg | ORAL_TABLET | Freq: Every day | ORAL | Status: DC
  Filled 2017-02-28: qty 1

## 2017-02-28 MED ORDER — BUPROPION HCL 75 MG PO TABS
75.0000 mg | ORAL_TABLET | Freq: Every morning | ORAL | Status: DC
  Administered 2017-02-28 – 2017-03-02 (×3): 75 mg via ORAL
  Filled 2017-02-28 (×4): qty 1

## 2017-02-28 MED ORDER — ARIPIPRAZOLE 10 MG PO TABS
10.0000 mg | ORAL_TABLET | Freq: Every day | ORAL | Status: DC
  Administered 2017-02-28 – 2017-03-04 (×5): 10 mg via ORAL
  Filled 2017-02-28 (×7): qty 1

## 2017-02-28 MED ORDER — BENZTROPINE MESYLATE 0.5 MG PO TABS
0.5000 mg | ORAL_TABLET | Freq: Every day | ORAL | Status: DC
  Administered 2017-02-28 – 2017-03-04 (×5): 0.5 mg via ORAL
  Filled 2017-02-28 (×6): qty 1

## 2017-02-28 MED ORDER — TRAZODONE HCL 150 MG PO TABS
150.0000 mg | ORAL_TABLET | Freq: Every day | ORAL | Status: DC
Start: 2017-02-28 — End: 2017-03-05
  Administered 2017-02-28 – 2017-03-04 (×5): 150 mg via ORAL
  Filled 2017-02-28 (×6): qty 1

## 2017-02-28 MED ORDER — FERROUS SULFATE 325 (65 FE) MG PO TABS
325.0000 mg | ORAL_TABLET | Freq: Every day | ORAL | Status: DC
  Administered 2017-03-01 – 2017-03-05 (×5): 325 mg via ORAL
  Filled 2017-02-28 (×6): qty 1

## 2017-02-28 NOTE — [facility] Suicide Risk Assessment (Signed)
[name] INPATIENT:  Family/Significant Other Suicide Prevention Education  Suicide Prevention Education:  Education Completed; No one has been identified by the patient as the family member/significant other with whom the patient will be residing, and identified as the person(s) who will aid the patient in the event of a mental health crisis (suicidal ideations/suicide attempt).  With written consent from the patient, the family member/significant other has been provided the following suicide prevention education, prior to the and/or following the discharge of the patient.  The suicide prevention education provided includes the following:  Suicide risk factors  Suicide prevention and interventions  National Suicide Hotline telephone number  [facility] assessment telephone number  [facility] Emergency Assistance [place] and/or Residential Mobile Crisis Unit telephone number  Request made of family/significant other to:  Remove weapons (e.g., guns, rifles, knives), all items previously/currently identified as safety concern.    Remove drugs/medications (over-the-counter, prescriptions, illicit drugs), all items previously/currently identified as a safety concern.  The family member/significant other verbalizes understanding of the suicide prevention education information provided.  The family member/significant other agrees to remove the items of safety concern listed above. The patient did not endorse SI at the time of admission, nor did the patient c/o SI during the stay here.  SPE not required.   [place] 02/28/2017, 4:24 PM

## 2017-02-28 NOTE — Progress Notes (Signed)
[facility] MD Progress Note  02/28/2017 3:04 PM [name]  MRN:  [id number] Subjective: Pt states " My sock was wet and I did not want to spread germs from one window to another .'   Objective:Patient seen and chart reviewed.Discussed patient with treatment team.  Pt today seen as irrelevant, delusional. Pt continues to be depressed , with psychomotor retardation, thought blocking. Pt also with sleep issues , per staff got up and took a shower at 4 am and had difficulty going back to sleep. Pt continues to need a lot of support.      Principal Problem: Bipolar disorder, curr episode mixed, severe, with psychotic features ([place]) Diagnosis:   Patient Active Problem List   Diagnosis Date Noted  . Diabetes mellitus ([place]) [E11.9] 08/10/2016  . Bipolar disorder, curr episode mixed, severe, with psychotic features ([place]) [F31.64] 08/07/2016  . Cannabis use disorder, moderate, dependence ([place]) [F12.20] 08/07/2016  . CHOLECYSTITIS, UNSPECIFIED [K81.9] 06/12/2008  . BILIARY COLIC [W97.98] 92/05/9416  . CERUMEN IMPACTION, BILATERAL [H61.20] 01/17/2008  . PHARYNGITIS, VIRAL [J02.9] 01/17/2008  . NECK PAIN [M54.2] 01/17/2008   Total Time spent with patient: 25 minutes  Past Psychiatric History: Please see H&P.   Past Medical History:  Past Medical History:  Diagnosis Date  . Abnormal Pap smear   . Bipolar 1 disorder ([place])   . Fibroids   . IBS (irritable bowel syndrome)     Past Surgical History:  Procedure Laterality Date  . CERVICAL BIOPSY    . CHOLECYSTECTOMY    . TUBAL LIGATION     Family History:  Family History  Problem Relation Age of Onset  . Diabetes Father   . Diabetes Paternal Grandmother   . Diabetes Maternal Grandmother   . Mental illness Cousin    Family Psychiatric  History: Please see H&P.  Social History: Please see H&P.  History  Alcohol Use  . Yes    Comment: occasional     History  Drug Use    Comment: CBD oil    Social History   Social History   . Marital status: Married    Spouse name: N/A  . Number of children: N/A  . Years of education: N/A   Social History Main Topics  . Smoking status: Current Every Day Smoker    Packs/day: 0.25    Years: 0.00    Types: Cigarettes  . Smokeless tobacco: Never Used  . Alcohol use Yes     Comment: occasional  . Drug use: Yes     Comment: CBD oil  . Sexual activity: Not Currently   Other Topics Concern  . None   Social History Narrative  . None   Additional Social History:                         Sleep: Poor  Appetite:  Fair  Current Medications: Current Facility-Administered Medications  Medication Dose Route Frequency Provider Last Rate Last Dose  . acetaminophen (TYLENOL) tablet 650 mg  650 mg Oral Q6H PRN Patrecia Pour, NP   650 mg at 02/26/17 0851  . alum & mag hydroxide-simeth (MAALOX/MYLANTA) 200-200-20 MG/5ML suspension 30 mL  30 mL Oral Q4H PRN Patrecia Pour, NP      . ARIPiprazole (ABILIFY) tablet 10 mg  10 mg Oral QHS Daffney Greenly, MD      . benztropine (COGENTIN) tablet 0.5 mg  0.5 mg Oral QHS Marcellina Jonsson, MD      . buPROPion (  WELLBUTRIN) tablet 75 mg  75 mg Oral q morning - 10a , , MD      . divalproex (DEPAKOTE) DR tablet 500 mg  500 mg Oral Q12H , , MD   500 mg at 02/28/17 0817  . [START ON 03/01/2017] ferrous sulfate tablet 325 mg  325 mg Oral Q breakfast , , MD      . LORazepam (ATIVAN) tablet 0.5 mg  0.5 mg Oral Q6H PRN Cobos, Fernando A, MD      . magnesium hydroxide (MILK OF MAGNESIA) suspension 30 mL  30 mL Oral Daily PRN Patrecia Pour, NP      . metFORMIN (GLUCOPHAGE) tablet 500 mg  500 mg Oral BID WC Patrecia Pour, NP   500 mg at 02/28/17 0817  . metroNIDAZOLE (FLAGYL) tablet 500 mg  500 mg Oral BID Patrecia Pour, NP   500 mg at 02/28/17 0817  . OLANZapine zydis (ZYPREXA) disintegrating tablet 5 mg  5 mg Oral TID PRN Ursula Alert, MD   5 mg at 02/27/17 1338   Or  . OLANZapine (ZYPREXA)  injection 5 mg  5 mg Intramuscular TID PRN Ursula Alert, MD      . traZODone (DESYREL) tablet 150 mg  150 mg Oral QHS , Ria Clock, MD        Lab Results:  Results for orders placed or performed during the hospital encounter of 02/25/17 (from the past 48 hour(s))  TSH     Status: None   Collection Time: 02/27/17  6:25 AM  Result Value Ref Range   TSH 1.944 0.350 - 4.500 uIU/mL    Comment: Performed by a 3rd Generation assay with a functional sensitivity of <=0.01 uIU/mL. Performed at [facility], [place] [address], [place], [place] [address]   Prolactin     Status: Abnormal   Collection Time: 02/27/17  6:25 AM  Result Value Ref Range   Prolactin 112.8 (H) 4.8 - 23.3 ng/mL    Comment: (NOTE) Performed At: [facility] [place], [place] [id number] Lindon Romp MD RW:[id number] Performed at [facility], [place] [address] [place], [place] [address]   Hemoglobin A1c     Status: Abnormal   Collection Time: 02/27/17  6:25 AM  Result Value Ref Range   Hgb A1c MFr Bld 7.5 (H) 4.8 - 5.6 %    Comment: (NOTE) Pre diabetes:          5.7%-6.4% Diabetes:              >6.4% Glycemic control for   <7.0% adults with diabetes    Mean Plasma Glucose 168.55 mg/dL    Comment: Performed at [place] [address] [place], [place] [address]  Glucose, capillary     Status: Abnormal   Collection Time: 02/27/17  5:04 PM  Result Value Ref Range   Glucose-Capillary 120 (H) 65 - 99 mg/dL   Comment 1 Notify RN    Comment 2 Document in Chart   Glucose, capillary     Status: Abnormal   Collection Time: 02/28/17  6:03 AM  Result Value Ref Range   Glucose-Capillary 142 (H) 65 - 99 mg/dL  CBC with Differential/Platelet     Status: Abnormal   Collection Time: 02/28/17  6:19 AM  Result Value Ref Range   WBC 9.1 4.0 - 10.5 K/uL   RBC 4.29 3.87 - 5.11 MIL/uL   Hemoglobin 11.1 (L) 12.0 - 15.0 g/dL  HCT 33.8 (L) 36.0 - 46.0 %    MCV 78.8 78.0 - 100.0 fL   MCH 25.9 (L) 26.0 - 34.0 pg   MCHC 32.8 30.0 - 36.0 g/dL   RDW 16.8 (H) 11.5 - 15.5 %   Platelets 339 150 - 400 K/uL   Neutrophils Relative % 67 %   Neutro Abs 6.0 1.7 - 7.7 K/uL   Lymphocytes Relative 22 %   Lymphs Abs 2.0 0.7 - 4.0 K/uL   Monocytes Relative 9 %   Monocytes Absolute 0.9 0.1 - 1.0 K/uL   Eosinophils Relative 2 %   Eosinophils Absolute 0.2 0.0 - 0.7 K/uL   Basophils Relative 0 %   Basophils Absolute 0.0 0.0 - 0.1 K/uL    Comment: Performed at [facility], [place] [address], [place], [place] [address]  Lipid panel     Status: Abnormal   Collection Time: 02/28/17  6:19 AM  Result Value Ref Range   Cholesterol 148 0 - 200 mg/dL   Triglycerides 116 <150 mg/dL   HDL 36 (L) >40 mg/dL   Total CHOL/HDL Ratio 4.1 RATIO   VLDL 23 0 - 40 mg/dL   LDL Cholesterol 89 0 - 99 mg/dL    Comment:        Total Cholesterol/HDL:CHD Risk Coronary Heart Disease Risk Table                     Men   Women  1/2 Average Risk   3.4   3.3  Average Risk       5.0   4.4  2 X Average Risk   9.6   7.1  3 X Average Risk  23.4   11.0        Use the calculated Patient Ratio above and the CHD Risk Table to determine the patient's CHD Risk.        ATP III CLASSIFICATION (LDL):  <100     mg/dL   Optimal  100-129  mg/dL   Near or Above                    Optimal  130-159  mg/dL   Borderline  160-189  mg/dL   High  >190     mg/dL   Very High Performed at [place] [address], [place],  [address]   Basic metabolic panel     Status: Abnormal   Collection Time: 02/28/17  6:19 AM  Result Value Ref Range   Sodium 138 135 - 145 mmol/L   Potassium 4.4 3.5 - 5.1 mmol/L   Chloride 103 101 - 111 mmol/L   CO2 25 22 - 32 mmol/L   Glucose, Bld 148 (H) 65 - 99 mg/dL   BUN 12 6 - 20 mg/dL   Creatinine, Ser 0.74 0.44 - 1.00 mg/dL   Calcium 9.3 8.9 - 10.3 mg/dL   GFR calc non Af Amer >60 >60 mL/min   GFR calc Af Amer >60 >60 mL/min     Comment: (NOTE) The eGFR has been calculated using the CKD EPI equation. This calculation has not been validated in all clinical situations. eGFR's persistently <60 mL/min signify possible Chronic Kidney Disease.    Anion gap 10 5 - 15    Comment: Performed at [facility], [place] [address], [place], [place] [address]  Iron and TIBC     Status: Abnormal   Collection Time: 02/28/17  6:19 AM  Result  Value Ref Range   Iron 37 28 - 170 ug/dL   TIBC 441 250 - 450 ug/dL   Saturation Ratios 8 (L) 10.4 - 31.8 %   UIBC 404 ug/dL    Comment: Performed at [facility] [address] [place], [place] [address]  Ferritin     Status: Abnormal   Collection Time: 02/28/17  6:19 AM  Result Value Ref Range   Ferritin 8 (L) 11 - 307 ng/mL    Comment: Performed at [facility], [place] [address] [place], [place] [address]    Blood Alcohol level:  Lab Results  Component Value Date   [facility] <5 02/24/2017   ETH <5 04/08/1218    Metabolic Disorder Labs: Lab Results  Component Value Date   HGBA1C 7.5 (H) 02/27/2017   MPG 168.55 02/27/2017   MPG 171 08/06/2016   Lab Results  Component Value Date   PROLACTIN 112.8 (H) 02/27/2017   PROLACTIN 8.5 08/06/2016   Lab Results  Component Value Date   CHOL 148 02/28/2017   TRIG 116 02/28/2017   HDL 36 (L) 02/28/2017   CHOLHDL 4.1 02/28/2017   VLDL 23 02/28/2017   LDLCALC 89 02/28/2017   LDLCALC 106 (H) 08/06/2016    Physical Findings: AIMS: Facial and Oral Movements Muscles of Facial Expression: None, normal Lips and Perioral Area: None, normal Jaw: None, normal Tongue: None, normal,Extremity Movements Upper (arms, wrists, hands, fingers): None, normal Lower (legs, knees, ankles, toes): None, normal, Trunk Movements Neck, shoulders, hips: None, normal, Overall Severity Severity of abnormal movements (highest score from questions above): None, normal Incapacitation due to abnormal movements: None,  normal Patient's awareness of abnormal movements (rate only patient's report): No Awareness, Dental Status Current problems with teeth and/or dentures?: No Does patient usually wear dentures?: No  CIWA:    COWS:     Musculoskeletal: Strength & Muscle Tone: within normal limits Gait & Station: normal Patient leans: N/A  Psychiatric Specialty Exam: Physical Exam  Nursing note and vitals reviewed.   Review of Systems  Psychiatric/Behavioral: Positive for depression. The patient is nervous/anxious and has insomnia.   All other systems reviewed and are negative.   Blood pressure (!) 157/94, pulse (!) 105, temperature 98.6 F (37 C), temperature source Oral, resp. rate 20, height 5' 11" (1.803 m), weight 135.2 kg (298 lb).Body mass index is 41.56 kg/m.  General Appearance: Guarded  Eye Contact:  Fair  Speech:  Slow  Volume:  Decreased  Mood:  Anxious and Euphoric  Affect:  Depressed  Thought Process:  Disorganized, Irrelevant and Descriptions of Associations: Tangential  Orientation:  Full (Time, Place, and Person)  Thought Content:  Delusions, Paranoid Ideation, Rumination and Tangential  Suicidal Thoughts:  No  Homicidal Thoughts:  No  Memory:  Immediate;   Fair Recent;   Fair Remote;   Fair  Judgement:  Impaired  Insight:  Shallow  Psychomotor Activity:  Normal  Concentration:  Concentration: Fair and Attention Span: Fair  Recall:  [employer] of Knowledge:  Fair  Language:  Fair  Akathisia:  No  Handed:  Right  AIMS (if indicated):     Assets:  Desire for Improvement  ADL's:  Intact  Cognition:  WNL  Sleep:  Number of Hours: 5.75   Will continue today 02/28/17  plan as below except where it is noted.   Treatment Plan Summary:Patient with bipolar do , presented as delusional, has psychomotor retardation, continues to have thought blocking , as well as  has sleep issues.  Will continue to make medication readjustments.  Daily contact with patient to assess and  evaluate symptoms and progress in treatment, Medication management and Plan see below  Resumed Depakote DR 500 mg po bid for mood sx. Depakote level on 03/03/2017 - Saturday Discontinued Risperidone - her PL is elevated. Will start Abilify 10 mg po qhs for augmenting the depakote as well as for mood sx. Increase Trazodone to 150 mg po qhs for sleep. Add Wellbutrin 75 mg po daily for affective sx. Continue home medications where indicated. Continue Flagyl as scheduled per [facility] for UTI. Reviewed Iron panel - will replace with Feso4 325 mg po daily with breakfast. PL level elevated - see above change - will monitor closely. Reviewed EKG - qtc - wnl. CSW will continue to work on disposition.   ,, MD 02/28/2017, 3:04 PM

## 2017-02-28 NOTE — Progress Notes (Addendum)
Recreation Therapy Notes  Date: 2/29/18 Time: 1000 Location: 500 Hall Dayroom  Group Topic: Wellness  Goal Area(s) Addresses:  Patient will define components of whole wellness. Patient will verbalize benefit of whole wellness.  Behavioral Response: Minimal  Intervention:  2 Decks of cards  Activity: Deck of Chance.  LRT had two decks of cards.  From one deck, LRT gave each patient two cards.  From the other deck, LRT would pull a card and whatever the card was the patient with the matching number had to do the exercise that corresponded with that number.  Education: Wellness, Dentist.   Education Outcome: Acknowledges education/In group clarification offered/Needs additional education.   Clinical Observations/Feedback: Pt moved very slow and seemed confused.  Pt did some of the exercises but eventually observed for the remainder of group.  Pt expressed cooking is a part of wellness.   Victorino Sparrow, LRT/CTRS         Ria Comment, Khristina Janota A 02/28/2017 1:00 PM

## 2017-02-28 NOTE — Progress Notes (Signed)
D: During the assessment pt informed the writer that she was "learning a lot". Stated, "if I get back into the swing of things I'll start doing better out side of here". Stated, "I'm grieving for no reason when everything is ok". When asked what she was grieving pt stated, "deaths problems with my ex husband". Stated she needs to think, "how are you doing. What are you doing> Stated, "I need to stop and focus". Feels she may be "self sabotage".  Pt was interactive and engaged in conversation. However, apprx 1 ot 1.5 hrs later, pt approached the writer and was cautious. Pt's eyes were wide and facial expression appeared bewildered. Pt has no questions or concerns.    A:  Support and encouragement was offered. 15 min checks continued for safety.  R: Pt remains safe.

## 2017-02-28 NOTE — Progress Notes (Signed)
Patient did not attend group.

## 2017-02-28 NOTE — Progress Notes (Signed)
Patient has been isolative to her room this shift.  Patient has been noted to be psychomotor retarded and thought blocking.  Patient had minimal conversation with staff but was compliant with medications.  Assess patient for safety, offer medications as prescribed, engage patient in 1:1 staff talks.   Continue to monitor as planned, Patient able to contract for safety.

## 2017-02-28 NOTE — [facility] Group Notes (Signed)
LCSW Group Therapy Note   02/28/2017 1:15pm   Type of Therapy and Topic:  Group Therapy:  Overcoming Obstacles   Participation Level:  None   Description of Group:    In this group patients will be encouraged to explore what they see as obstacles to their own wellness and recovery. They will be guided to discuss their thoughts, feelings, and behaviors related to these obstacles. The group will process together ways to cope with barriers, with attention given to specific choices patients can make. Each patient will be challenged to identify changes they are motivated to make in order to overcome their obstacles. This group will be process-oriented, with patients participating in exploration of their own experiences as well as giving and receiving support and challenge from other group members.   Therapeutic Goals: 1. Patient will identify personal and current obstacles as they relate to admission. 2. Patient will identify barriers that currently interfere with their wellness or overcoming obstacles.  3. Patient will identify feelings, thought process and behaviors related to these barriers. 4. Patient will identify two changes they are willing to make to overcome these obstacles:      Summary of Patient Progress   Thought blocking, disorganized.  Difficulty deciding whether to sit or stand, put up her hand and then took it down again.  Eventually decided to leave, but had a hard time making it out of the room.  Had to call staff to help.   Therapeutic Modalities:   Cognitive Behavioral Therapy Solution Focused Therapy Motivational Interviewing Relapse Prevention Therapy  Trish Mage, LCSW 02/28/2017 3:57 PM

## 2017-03-01 ENCOUNTER — Ambulatory Visit: Payer: Self-pay | Admitting: Obstetrics

## 2017-03-01 LAB — GLUCOSE, CAPILLARY
Glucose-Capillary: 143 mg/dL — ABNORMAL HIGH (ref 65–99)
Glucose-Capillary: 144 mg/dL — ABNORMAL HIGH (ref 65–99)

## 2017-03-01 LAB — PROLACTIN: Prolactin: 105.5 ng/mL — ABNORMAL HIGH (ref 4.8–23.3)

## 2017-03-01 MED ORDER — LOPERAMIDE HCL 2 MG PO CAPS
2.0000 mg | ORAL_CAPSULE | ORAL | Status: AC | PRN
  Administered 2017-03-01: 4 mg via ORAL
  Administered 2017-03-02: 2 mg via ORAL
  Filled 2017-03-01: qty 1
  Filled 2017-03-01: qty 2

## 2017-03-01 NOTE — BHH Group Notes (Signed)
LCSW Group Therapy 03/01/2017 1:15pm  Type of Therapy and Topic:  Group Therapy:  Change and Accountability  Participation Level:  Minimal  Description of Group In this group, patients discussed power and accountability for change.  The group identified the challenges related to accountability and the difficulty of accepting the outcomes of negative behaviors.  Patients were encouraged to openly discuss a challenge/change they could take responsibility for.  Patients discussed the use of "change talk" and positive thinking as ways to support achievement of personal goals.  The group discussed ways to give support and empowerment to peers.  Therapeutic Goals: 1. Patients will state the relationship between personal power and accountability in the change process 2. Patients will identify the positive and negative consequences of a personal choice they have made 3. Patients will identify one challenge/choice they will take responsibility for making 4. Patients will discuss the role of "change talk" and the impact of positive thinking as it supports successful personal change 5. Patients will verbalize support and affirmation of change efforts in peers  Summary of Patient Progress:  Continues to present with thought blocking and psychomotor retardation, but was able to stay the entire time and contribute today.  Identified her mother and grandmother as roles models/supports in her life "who have taught me about faith, and I know are praying for me now."  Also able to identify strengths of intelligence, and the ability to bounce back for as building blocks for going forward.  Therapeutic Modalities Solution Focused Brief Therapy Motivational Interviewing Cognitive Behavioral Therapy  Trish Mage, [place] 03/01/2017 2:44 PM
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
## 2017-03-01 NOTE — Progress Notes (Signed)
Patient ID: [name], female   DOB: 09/29/1975, 41 y.o.   MRN: [id number]  Pt currently presents with a flat affect and appropriate behavior. Her responses  and movements are slow, pt seen responding to internal stimuli.  Pt reports to Probation officer that their goal is to "go to groups and talk more." Pt states "my sister visited tonight, it went really well." Pt reports good sleep with current medication regimen. Pt endorses diarrhea, requests antidiarrheal medication. Pt was started on Metformin recently.   Pt provided with medications per providers orders. Pt's labs and vitals were monitored throughout the night. Pt given a 1:1 about emotional and mental status. Pt supported and encouraged to express concerns and questions. Pt educated on medications. Provider notified of patients concerns, see MAR.   Pt's safety ensured with 15 minute and environmental checks. Pt currently denies SI/HI and A/V hallucinations (see above). Pt verbally agrees to seek staff if SI/HI or A/VH occurs and to consult with staff before acting on any harmful thoughts. Pt experienced relief with antidiarrheal.  Will continue POC.

## 2017-03-01 NOTE — Progress Notes (Signed)
Recreation Therapy Notes  Date: 03/01/17 Time: 1000 Location: 500 Hall Dayroom  Group Topic: Communication, Team Building, Problem Solving  Goal Area(s) Addresses:  Patient will effectively work with peer towards shared goal.  Patient will identify skill used to make activity successful.  Patient will identify how skills used during activity can be used to reach post d/c goals.   Behavioral Response: Minimal  Intervention: STEM Activity   Activity: [employer]. Patients were provided the following materials: 5 drinking straws, 5 rubber bands, 5 paper clips, 2 index cards, 2 drinking cups, and 2 toilet paper rolls. Using the provided materials patients were asked to build a launching mechanisms to launch a ping pong ball approximately 12 feet. Patients were divided into teams of 3-5.   Education: Education officer, community, Dentist.   Education Outcome: Acknowledges education/In group clarification offered/Needs additional education.   Clinical Observations/Feedback: Pt tried to engage with peers and come up with a concept.  Pt seemed to have a hard time understanding the rules and focusing on the activity.  Pt left but eventually came back.  Pt stated they were able to complete the activity by "taking medication".   Victorino Sparrow, LRT/CTRS         Victorino Sparrow A 03/01/2017 12:13 PM

## 2017-03-01 NOTE — Tx Team (Signed)
Interdisciplinary Treatment and Diagnostic Plan Update  03/01/2017 Time of Session: 10:36 AM  [name] MRN: [id number]  Principal Diagnosis: Bipolar disorder, curr episode mixed, severe, with psychotic features ([place])  Secondary Diagnoses: Principal Problem:   Bipolar disorder, curr episode mixed, severe, with psychotic features ([place]) Active Problems:   Cannabis use disorder, moderate, dependence ([place])   Diabetes mellitus ([place])   Current Medications:  Current Facility-Administered Medications  Medication Dose Route Frequency Provider Last Rate Last Dose  . acetaminophen (TYLENOL) tablet 650 mg  650 mg Oral Q6H PRN Patrecia Pour, NP   650 mg at 02/26/17 0851  . alum & mag hydroxide-simeth (MAALOX/MYLANTA) 200-200-20 MG/5ML suspension 30 mL  30 mL Oral Q4H PRN Patrecia Pour, NP      . ARIPiprazole (ABILIFY) tablet 10 mg  10 mg Oral QHS Ursula Alert, MD   10 mg at 02/28/17 2019  . benztropine (COGENTIN) tablet 0.5 mg  0.5 mg Oral QHS Eappen, Saramma, MD   0.5 mg at 02/28/17 2020  . buPROPion [facility]) tablet 75 mg  75 mg Oral q morning - 10a Ursula Alert, MD   75 mg at 03/01/17 0821  . divalproex (DEPAKOTE) DR tablet 500 mg  500 mg Oral Q12H Eappen, Saramma, MD   500 mg at 03/01/17 0819  . ferrous sulfate tablet 325 mg  325 mg Oral Q breakfast Ursula Alert, MD   325 mg at 03/01/17 0819  . LORazepam (ATIVAN) tablet 0.5 mg  0.5 mg Oral Q6H PRN Cobos, Myer Peer, MD   0.5 mg at 03/01/17 0134  . magnesium hydroxide (MILK OF MAGNESIA) suspension 30 mL  30 mL Oral Daily PRN Patrecia Pour, NP      . metFORMIN (GLUCOPHAGE) tablet 500 mg  500 mg Oral BID WC Patrecia Pour, NP   500 mg at 03/01/17 8242  . metroNIDAZOLE (FLAGYL) tablet 500 mg  500 mg Oral BID Patrecia Pour, NP   500 mg at 03/01/17 3536  . OLANZapine zydis (ZYPREXA) disintegrating tablet 5 mg  5 mg Oral TID PRN Ursula Alert, MD   5 mg at 02/27/17 1338   Or  . OLANZapine (ZYPREXA) injection 5 mg  5 mg  Intramuscular TID PRN Ursula Alert, MD      . traZODone (DESYREL) tablet 150 mg  150 mg Oral QHS Ursula Alert, MD   150 mg at 02/28/17 2022    PTA Medications: Prescriptions Prior to Admission  Medication Sig Dispense Refill Last Dose  . benztropine (COGENTIN) 0.5 MG tablet Take 1 tablet (0.5 mg total) by mouth 2 (two) times daily in the am and at bedtime.. 60 tablet 0 02/24/2017 at 9am  . divalproex (DEPAKOTE ER) 250 MG 24 hr tablet Take 3 tablets (750 mg total) by mouth at bedtime. 90 tablet 0 02/24/2017 at 9am  . haloperidol (HALDOL) 5 MG tablet Take 1 tablet (5 mg total) by mouth 2 (two) times daily in the am and at bedtime.. 60 tablet 0 02/24/2017 at 9am  . haloperidol decanoate (HALDOL DECANOATE) 100 MG/ML injection Inject 1 mL (100 mg total) into the muscle every 30 (thirty) days. Next dose due 09/11/2016 (Patient not taking: Reported on 02/22/2017) 1 mL 0 Not Taking at Unknown time  . ibuprofen (ADVIL,MOTRIN) 200 MG tablet Take 400 mg by mouth every 6 (six) hours as needed for mild pain.   02/24/2017 at Unknown time  . loperamide (IMODIUM A-D) 2 MG tablet Take 2 mg by mouth 4 (four) times daily  as needed for diarrhea or loose stools.   Past Week at Unknown time  . metFORMIN (GLUCOPHAGE) 500 MG tablet Take 1 tablet (500 mg total) by mouth 2 (two) times daily with a meal. 60 tablet 0 02/24/2017 at 9am  . metroNIDAZOLE (FLAGYL) 500 MG tablet Take 1 tablet (500 mg total) by mouth 2 (two) times daily. 14 tablet 0 02/24/2017 at Unknown time  . traZODone (DESYREL) 100 MG tablet Take 1 tablet (100 mg total) by mouth at bedtime as needed for sleep. 30 tablet 0 02/23/2017 at Unknown time    Patient Stressors: Marital or family conflict Traumatic event  Patient Strengths: Ability for insight Average or above average intelligence Capable of independent living Communication skills Motivation for treatment/growth Physical Health Supportive family/friends  Treatment Modalities: Medication  Management, Group therapy, Case management,  1 to 1 session with clinician, Psychoeducation, Recreational therapy.   Physician Treatment Plan for Primary Diagnosis: Bipolar disorder, curr episode mixed, severe, with psychotic features ([place]) Long Term Goal(s): Improvement in symptoms so as ready for discharge  Short Term Goals: Ability to identify changes in lifestyle to reduce recurrence of condition will improve Ability to verbalize feelings will improve Ability to disclose and discuss suicidal ideas Ability to identify and develop effective coping behaviors will improve Ability to identify and develop effective coping behaviors will improve Compliance with prescribed medications will improve Ability to identify triggers associated with substance abuse/mental health issues will improve  Medication Management: Evaluate patient's response, side effects, and tolerance of medication regimen.  Therapeutic Interventions: 1 to 1 sessions, Unit Group sessions and Medication administration.  Evaluation of Outcomes: Progressing   8/30: Patient with bipolar do , presented as delusional, has psychomotor retardation, continues to have thought blocking , as well as has sleep issues.  Will continue to make medication readjustments.  Resumed Depakote DR 500 mg po bid for mood sx. Depakote level on 03/03/2017 - Saturday Discontinued Risperidone - her PL is elevated. Will start Abilify 10 mg po qhs for augmenting the depakote as well as for mood sx. Increase Trazodone to 150 mg po qhs for sleep. Add Wellbutrin 75 mg po daily for affective sx.  Physician Treatment Plan for Secondary Diagnosis: Principal Problem:   Bipolar disorder, curr episode mixed, severe, with psychotic features ([place]) Active Problems:   Cannabis use disorder, moderate, dependence ([place])   Diabetes mellitus ([place])   Long Term Goal(s): Improvement in symptoms so as ready for discharge  Short Term Goals: Ability to identify  changes in lifestyle to reduce recurrence of condition will improve Ability to verbalize feelings will improve Ability to disclose and discuss suicidal ideas Ability to identify and develop effective coping behaviors will improve Ability to identify and develop effective coping behaviors will improve Compliance with prescribed medications will improve Ability to identify triggers associated with substance abuse/mental health issues will improve  Medication Management: Evaluate patient's response, side effects, and tolerance of medication regimen.  Therapeutic Interventions: 1 to 1 sessions, Unit Group sessions and Medication administration.  Evaluation of Outcomes: Progressing   RN Treatment Plan for Primary Diagnosis: Bipolar disorder, curr episode mixed, severe, with psychotic features ([place]) Long Term Goal(s): Knowledge of disease and therapeutic regimen to maintain health will improve  Short Term Goals: Ability to identify and develop effective coping behaviors will improve and Compliance with prescribed medications will improve  Medication Management: RN will administer medications as ordered by provider, will assess and evaluate patient's response and provide education to patient for prescribed medication. RN will  report any adverse and/or side effects to prescribing provider.  Therapeutic Interventions: 1 on 1 counseling sessions, Psychoeducation, Medication administration, Evaluate responses to treatment, Monitor vital signs and CBGs as ordered, Perform/monitor CIWA, COWS, AIMS and Fall Risk screenings as ordered, Perform wound care treatments as ordered.  Evaluation of Outcomes: Progressing    Recreational Therapy Treatment Plan for Primary Diagnosis: Bipolar disorder, curr episode mixed, severe, with psychotic features ([place]) Long Term Goal(s): Patient will participate in recreation therapy treatment in at least 2 group sessions without prompting from LRT  Short Term Goals:  Patient will be able to identify at least 5 coping skills for admitting diagnosis by conclusion of recreation therapy treatment  Treatment Modalities: Group and Pet Therapy  Therapeutic Interventions: Psychoeducation  Evaluation of Outcomes: Progressing   LCSW Treatment Plan for Primary Diagnosis: Bipolar disorder, curr episode mixed, severe, with psychotic features ([place]) Long Term Goal(s): Safe transition to appropriate next level of care at discharge, Engage patient in therapeutic group addressing interpersonal concerns.  Short Term Goals: Engage patient in aftercare planning with referrals and resources  Therapeutic Interventions: Assess for all discharge needs, 1 to 1 time with Social worker, Explore available resources and support systems, Assess for adequacy in community support network, Educate family and significant other(s) on suicide prevention, Complete Psychosocial Assessment, Interpersonal group therapy.  Evaluation of Outcomes: Met  Return home, follow up outpt clinic   Progress in Treatment: Attending groups: Yes Participating in groups: Yes Taking medication as prescribed: Yes Toleration medication: Yes, no side effects reported at this time Family/Significant other contact made: No Patient understands diagnosis: No Limited insgiht Discussing patient identified problems/goals with staff: Yes Medical problems stabilized or resolved: Yes Denies suicidal/homicidal ideation: Yes Issues/concerns per patient self-inventory: None Other: N/A  New problem(s) identified: None identified at this time.   New Short Term/Long Term Goal(s):"Self discipline to take my meds when prescribed. Can I get them all prescribed at bedtime so I can remember more easily?"  Discharge Plan or Barriers:   Reason for Continuation of Hospitalization: Disorganization Thought blocking Medication stabilization   Estimated Length of Stay: 9/4  Attendees: Patient: 03/01/2017  10:36 AM   Physician: Ursula Alert, MD 03/01/2017  10:36 AM  Nursing: Sena Hitch, RN 03/01/2017  10:36 AM  RN Care Manager: Lars Pinks, RN 03/01/2017  10:36 AM  Social Worker: Ripley Fraise 03/01/2017  10:36 AM  Recreational Therapist: Victorino Sparrow, LRT/CTRS 03/01/2017  10:36 AM  Other: Norberto Sorenson 03/01/2017  10:36 AM  Other:  03/01/2017  10:36 AM    Scribe for Treatment Team:  Roque Lias LCSW 03/01/2017 10:36 AM

## 2017-03-01 NOTE — Progress Notes (Signed)
D: Pt was less engaging and exhibited more psychomotor retardation than the previous days. It took several minutes of talking to pt before she decided to walk down the hall with the Probation officer. Writer pointed back to her room, and softly said "my family needs me". Writer informed pt that her family was at home, and encouraged to take her meds so she could back to her family. Pt was reluctant to take her meds, but did. Pt has no questions or concerns.    A:  Support and encouragement was offered. 15 min checks continued for safety.  R: Pt remains safe.

## 2017-03-01 NOTE — Progress Notes (Signed)
Patient attended group and said that her day was a 9.  Patient goal for tomorrow is to discuss her discharge plan.

## 2017-03-01 NOTE — Progress Notes (Signed)
Nursing Note: 0700-1900  D:  Pt presents with depressed mood and blank affect, thought blocking is evident, pt is slow to respond to questions.  Noted slight improvement in processing information and speech throughout shift. Pt verbalizes concern whether her family knows that she is here, reassured that they have been here to visit her this week.  A:  Encouraged to verbalize needs and concerns, active listening and support provided.  Continued Q 15 minute safety checks.  Observed active participation in group settings.  R:  Pt. is calm and cooperative, appears confused and lost at times in unit, redirects easily.  Denies A/V hallucinations and is able to verbally contract for safety.

## 2017-03-02 LAB — GLUCOSE, CAPILLARY
Glucose-Capillary: 134 mg/dL — ABNORMAL HIGH (ref 65–99)
Glucose-Capillary: 180 mg/dL — ABNORMAL HIGH (ref 65–99)

## 2017-03-02 MED ORDER — BUPROPION HCL 100 MG PO TABS
100.0000 mg | ORAL_TABLET | Freq: Every day | ORAL | Status: DC
  Administered 2017-03-03 – 2017-03-05 (×3): 100 mg via ORAL
  Filled 2017-03-02 (×4): qty 1

## 2017-03-02 NOTE — Progress Notes (Signed)
Recreation Therapy Notes  Date: 03/02/17 Time: 1000 Location: 500 Hall Dayroom  Group Topic: Leisure Education  Goal Area(s) Addresses:  Patient will identify positive leisure activities.  Patient will identify one positive benefit of participation in leisure activities.   Behavioral Response: Engaged  Intervention: Chairs, small beach ball  Activity: Keep It Chartered certified accountant.  Patients were arranged in a circle.  Patients were to pass the ball back and forth to each other.  Patients could bounce the ball off of the floor but the ball could not come to a complete stop.  LRT would count the number of hits the group was able to get before the ball stopped.  It the ball stopped moving at any point, LRT would start the count over.  Education:  Leisure Education, Dentist  Education Outcome: Acknowledges education/In group clarification offered/Needs additional education  Clinical Observations/Feedback: Pt was much more brighter and was responding quicker.  Pt did smile a few times and was seen talking to her peers on occasion.     Victorino Sparrow, LRT/CTRS         Victorino Sparrow A 03/02/2017 11:37 AM

## 2017-03-02 NOTE — B[facility] Group Notes (Signed)
[place] LCSW Group Therapy  03/02/2017  1:05 PM  Type of Therapy:  Group therapy  Participation Level:  Active  Participation Quality:  Attentive  Affect:  Flat  Cognitive:  Oriented  Insight:  Limited  Engagement in Therapy:  Limited  Modes of Intervention:  Discussion, Socialization  Summary of Progress/Problems:  Chaplain was here to lead a group on themes of hope and courage.  "I reached out to my sister to mend that bridge.  It wasn't easy, but it paid off.  The things I do for myself are eating right, reading my devotional everyday, aroma therapy in the evening, doing what I can do to take care of my kids."  Reluctantly agreed that taking meds is a part of self care.  Roque Lias B 03/02/2017 1:29 PM

## 2017-03-02 NOTE — Progress Notes (Signed)
Nursing Note: 0700-1900  D:  Pt presents with depressed mood and blank/flat affect. She is concerned about leaving, " I don't know if I told anyone, but I would like to go home."  She reports depression 2/10, hopelessness 0/10 and anxiety 3/10.  "My goal is to work on a discharge goal.  Like knowing what I am taking and talking more." Pt is more verbal today and able to respond a little faster.  A:  Encouraged to verbalize needs and concerns, active listening and support provided.  Continued Q 15 minute safety checks.  Observed active participation in group settings.  R:  Pt. denies A/V hallucinations and is able to verbally contract for safety.

## 2017-03-02 NOTE — Progress Notes (Signed)
[facility] MD Progress Note  03/02/2017 12:04 PM [name]  MRN:  [id number]   Subjective: Patient stated that "I'm feeling much better, less depressed and less anxious and they're trying to interact with other people and socialize and feels my medication is working now and I don't want take any changes at this time".  Objective: Patient seen and chart reviewed.Discussed patient with treatment team.  Patient appeared on [place] way, calm, cooperative, decreased psychomotor activity, thought block and slow response. Patient continued to be depressed, decreased socialization and reportedly started feeling better and trying to participate in milieu therapy and group counseling sessions. Patient stated she has been sleeping okay, but keep waking up in the middle of the nightand has no problem with appetite and taking care of herself. Patient has been compliant with her medication without adverse affects. Patient denies current symptoms of mania, hallucinations, delusions and paranoia. Patient denies current suicidal/homicidal ideation and contracts for safety while in the hospital.   Principal Problem: Bipolar disorder, curr episode mixed, severe, with psychotic features ([place]) Diagnosis:   Patient Active Problem List   Diagnosis Date Noted  . Diabetes mellitus ([place]) [E11.9] 08/10/2016  . Bipolar disorder, curr episode mixed, severe, with psychotic features ([place]) [F31.64] 08/07/2016  . Cannabis use disorder, moderate, dependence ([place]) [F12.20] 08/07/2016  . CHOLECYSTITIS, UNSPECIFIED [K81.9] 06/12/2008  . BILIARY COLIC [P50.93] 26/71/2458  . CERUMEN IMPACTION, BILATERAL [H61.20] 01/17/2008  . PHARYNGITIS, VIRAL [J02.9] 01/17/2008  . NECK PAIN [M54.2] 01/17/2008   Total Time spent with patient: 25 minutes  Past Psychiatric History: Please see H&P.   Past Medical History:  Past Medical History:  Diagnosis Date  . Abnormal Pap smear   . Bipolar 1 disorder ([place])   . Fibroids   . IBS (irritable  bowel syndrome)     Past Surgical History:  Procedure Laterality Date  . CERVICAL BIOPSY    . CHOLECYSTECTOMY    . TUBAL LIGATION     Family History:  Family History  Problem Relation Age of Onset  . Diabetes Father   . Diabetes Paternal Grandmother   . Diabetes Maternal Grandmother   . Mental illness Cousin    Family Psychiatric  History: Please see H&P.  Social History: Please see H&P.  History  Alcohol Use  . Yes    Comment: occasional     History  Drug Use    Comment: CBD oil    Social History   Social History  . Marital status: Married    Spouse name: N/A  . Number of children: N/A  . Years of education: N/A   Social History Main Topics  . Smoking status: Current Every Day Smoker    Packs/day: 0.25    Years: 0.00    Types: Cigarettes  . Smokeless tobacco: Never Used  . Alcohol use Yes     Comment: occasional  . Drug use: Yes     Comment: CBD oil  . Sexual activity: Not Currently   Other Topics Concern  . None   Social History Narrative  . None   Additional Social History:    Sleep: Poor  Appetite:  Fair  Current Medications: Current Facility-Administered Medications  Medication Dose Route Frequency Provider Last Rate Last Dose  . acetaminophen (TYLENOL) tablet 650 mg  650 mg Oral Q6H PRN Patrecia Pour, NP   650 mg at 02/26/17 0851  . alum & mag hydroxide-simeth (MAALOX/MYLANTA) 200-200-20 MG/5ML suspension 30 mL  30 mL Oral Q4H PRN Patrecia Pour, NP      .  ARIPiprazole (ABILIFY) tablet 10 mg  10 mg Oral QHS Ursula Alert, MD   10 mg at 03/01/17 2113  . benztropine (COGENTIN) tablet 0.5 mg  0.5 mg Oral QHS Eappen, Saramma, MD   0.5 mg at 03/01/17 2113  . buPROPion [facility]) tablet 75 mg  75 mg Oral q morning - 10a Ursula Alert, MD   75 mg at 03/02/17 0803  . divalproex (DEPAKOTE) DR tablet 500 mg  500 mg Oral Q12H Eappen, Saramma, MD   500 mg at 03/02/17 0803  . ferrous sulfate tablet 325 mg  325 mg Oral Q breakfast Ursula Alert, MD   325 mg at 03/02/17 0803  . loperamide (IMODIUM) capsule 2-4 mg  2-4 mg Oral PRN Lindon Romp A, NP   4 mg at 03/01/17 2113  . LORazepam (ATIVAN) tablet 0.5 mg  0.5 mg Oral Q6H PRN Cobos, Myer Peer, MD   0.5 mg at 03/01/17 0134  . magnesium hydroxide (MILK OF MAGNESIA) suspension 30 mL  30 mL Oral Daily PRN Patrecia Pour, NP      . metFORMIN (GLUCOPHAGE) tablet 500 mg  500 mg Oral BID WC Patrecia Pour, NP   500 mg at 03/02/17 0804  . OLANZapine zydis (ZYPREXA) disintegrating tablet 5 mg  5 mg Oral TID PRN Ursula Alert, MD   5 mg at 02/27/17 1338   Or  . OLANZapine (ZYPREXA) injection 5 mg  5 mg Intramuscular TID PRN Ursula Alert, MD      . traZODone (DESYREL) tablet 150 mg  150 mg Oral QHS Ursula Alert, MD   150 mg at 03/01/17 2113    Lab Results:  Results for orders placed or performed during the hospital encounter of 02/25/17 (from the past 48 hour(s))  Glucose, capillary     Status: Abnormal   Collection Time: 03/01/17  5:29 AM  Result Value Ref Range   Glucose-Capillary 143 (H) 65 - 99 mg/dL  Glucose, capillary     Status: Abnormal   Collection Time: 03/01/17  5:08 PM  Result Value Ref Range   Glucose-Capillary 144 (H) 65 - 99 mg/dL  Glucose, capillary     Status: Abnormal   Collection Time: 03/02/17  6:13 AM  Result Value Ref Range   Glucose-Capillary 134 (H) 65 - 99 mg/dL    Blood Alcohol level:  Lab Results  Component Value Date   ETH <5 02/24/2017   ETH <5 24/40/1027    Metabolic Disorder Labs: Lab Results  Component Value Date   HGBA1C 7.5 (H) 02/27/2017   MPG 168.55 02/27/2017   MPG 171 08/06/2016   Lab Results  Component Value Date   PROLACTIN 105.5 (H) 02/28/2017   PROLACTIN 112.8 (H) 02/27/2017   Lab Results  Component Value Date   CHOL 148 02/28/2017   TRIG 116 02/28/2017   HDL 36 (L) 02/28/2017   CHOLHDL 4.1 02/28/2017   VLDL 23 02/28/2017   LDLCALC 89 02/28/2017   LDLCALC 106 (H) 08/06/2016    Physical  Findings: AIMS: Facial and Oral Movements Muscles of Facial Expression: None, normal Lips and Perioral Area: None, normal Jaw: None, normal Tongue: None, normal,Extremity Movements Upper (arms, wrists, hands, fingers): None, normal Lower (legs, knees, ankles, toes): None, normal, Trunk Movements Neck, shoulders, hips: None, normal, Overall Severity Severity of abnormal movements (highest score from questions above): None, normal Incapacitation due to abnormal movements: None, normal Patient's awareness of abnormal movements (rate only patient's report): No Awareness, Dental Status Current problems with teeth and/or  dentures?: No Does patient usually wear dentures?: No  CIWA:    COWS:     Musculoskeletal: Strength & Muscle Tone: within normal limits Gait & Station: normal Patient leans: N/A  Psychiatric Specialty Exam: Physical Exam  Nursing note and vitals reviewed.   Review of Systems  Psychiatric/Behavioral: Positive for depression. The patient is nervous/anxious and has insomnia.   All other systems reviewed and are negative.   Blood pressure 126/80, pulse (!) 102, temperature 98.8 F (37.1 C), temperature source Oral, resp. rate 16, height 5\' 11"  (1.803 m), weight 135.2 kg (298 lb).Body mass index is 41.56 kg/m.  General Appearance: Guarded, slowly improving  Eye Contact:  Fair  Speech:  Slow, delayed responses  Volume:  Decreased, soft  Mood:  Anxious and Depressed  Affect:  Depressed  Thought Process:  Disorganized, Irrelevant and Descriptions of Associations: Tangential  Orientation:  Full (Time, Place, and Person)  Thought Content:  Delusions, Paranoid Ideation, Rumination, Tangential and thought blocking.  Suicidal Thoughts:  No  Homicidal Thoughts:  No  Memory:  Immediate;   Fair Recent;   Fair Remote;   Fair  Judgement:  Impaired  Insight:  Shallow  Psychomotor Activity:  Normal  Concentration:  Concentration: Fair and Attention Span: Fair  Recall:  [employer] of Knowledge:  Fair  Language:  Fair  Akathisia:  No  Handed:  Right  AIMS (if indicated):     Assets:  Desire for Improvement  ADL's:  Intact  Cognition:  WNL  Sleep:  Number of Hours: 5.75   Will continue today 03/02/17  plan as below except where it is noted.   Treatment Plan Summary:Patient with bipolar do , presented as delusional, has psychomotor retardation, continues to have thought blocking , as well as has sleep issues.  Will continue to make medication readjustments.  Daily contact with patient to assess and evaluate symptoms and progress in treatment, Medication management and Plan see below  Continue Depakote DR 500 mg po bid for mood sx. Depakote level on 03/03/2017 - Saturday Continue  Abilify 10 mg po qhs for augmenting the depakote as well as for mood sx. Continue Trazodone to 150 mg po qhs for sleep. Increase Wellbutrin 100 mg po daily for affective sx.  Continue home medications where indicated. Continue Flagyl as scheduled per [facility] for UTI. Reviewed Iron panel - will replace with Feso4 325 mg po daily with breakfast. PL level elevated - see above change - will monitor closely. Reviewed EKG - qtc - wnl. CSW will continue to work on disposition.   Ambrose Finland, MD 03/02/2017, 12:04 PM

## 2017-03-02 NOTE — Progress Notes (Signed)
[facility] Group Notes:  (Nursing/MHT/Case Management/Adjunct)  Date:  03/02/2017  Time:  9:05 PM  Type of Therapy:  Psychoeducational Skills  Participation Level:  Active  Participation Quality:  Attentive  Affect:  Appropriate  Cognitive:  Appropriate  Insight:  Good  Engagement in Group:  Developing/Improving  Modes of Intervention:  Education  Summary of Progress/Problems: The patient verbalized in group that she went outside for fresh air and that she was able to journal as well. In addition, she mentioned that she had a good conversation on the telephone with a friend of hers. As for the theme of the day, she intends to try to inspire others as a coping skill.   Angelita Harnack S 03/02/2017, 9:05 PM

## 2017-03-02 NOTE — Progress Notes (Signed)
Patient ID: [name], female   DOB: [birth year], 41 y.o.   MRN: [id number]  Pt currently presents with a flat affect that brightens with interaction and cooperative behavior. Pt reports to writer that their goal is to "leave soon." Pt states "I have been writing in my journal about my motivations and goals." Identifies "Be inspired, To inspire" and one of her favorite motivational quotes. Pt reports good sleep with current medication regimen. Pt responses are still slow but improving.   Pt provided with medications per providers orders. Pt's labs and vitals were monitored throughout the night. Pt given a 1:1 about emotional and mental status. Pt supported and encouraged to express concerns and questions. Pt educated on medications and assertiveness techniques.   Pt's safety ensured with 15 minute and environmental checks. Pt currently denies SI/HI and A/V hallucinations. Pt verbally agrees to seek staff if SI/HI or A/VH occurs and to consult with staff before acting on any harmful thoughts. Will continue POC.

## 2017-03-03 DIAGNOSIS — F1220 Cannabis dependence, uncomplicated: Secondary | ICD-10-CM

## 2017-03-03 LAB — GLUCOSE, CAPILLARY
Glucose-Capillary: 144 mg/dL — ABNORMAL HIGH (ref 65–99)
Glucose-Capillary: 157 mg/dL — ABNORMAL HIGH (ref 65–99)

## 2017-03-03 LAB — VALPROIC ACID LEVEL: Valproic Acid Lvl: 73 ug/mL (ref 50.0–100.0)

## 2017-03-03 NOTE — [facility] Group Notes (Signed)
[facility] Group Notes:  (Nursing/MHT/Case Management/Adjunct)  Date:  03/03/2017  Time:  2:05 PM  Type of Therapy:  Nurse Education  Participation Level:  Active  Participation Quality:  Appropriate and Attentive  Affect:  Appropriate  Cognitive:  Alert and Oriented  Insight:  Appropriate  Engagement in Group:  Engaged  Modes of Intervention:  Discussion and Education  Summary of Progress/Problems: Coping Skills Group: Patient stated that her best coping skill is journaling,  and that a positive attribute she has is that she is smart. Royston Cowper Amarrion Pastorino 03/03/2017, 2:05 PM

## 2017-03-03 NOTE — Progress Notes (Signed)
Present in milieu, interactive with peers, staff and nursing students. Good participation in groups, discussed coping skill of journaling and also listed several positive qualities about herself when encouraged to do so. 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

## 2017-03-03 NOTE — Progress Notes (Signed)
D: Pt was in the dayroom upon initial approach.  Pt presents with preoccupied affect and mood.  Describes her day as "pretty good" and reports goal was "to use my coping skills, help with my discharge plan, participate in groups."  Reports positive coping skills of "coloring, reading the bible" and writing in her journal.  Pt denies SI/HI, denies hallucinations, denies pain.  Pt has been visible in milieu interacting with peers and staff appropriately.  Pt attended evening group.    A: Introduced self to pt.  Actively listened to pt and offered support and encouragement. Medications administered per order.  Q15 minute safety checks maintained.  R: Pt is safe on the unit.  Pt is compliant with medications.  Pt verbally contracts for safety.  Will continue to monitor and assess.

## 2017-03-03 NOTE — Progress Notes (Signed)
[facility] MD Progress Note  03/03/2017 4:10 PM [name]  MRN:  [id number]   Subjective: [name] reports, "I'm doing okay. I'm not as positive as I used to be. I feel like I made the choices being here"..  Objective: Patient seen and chart reviewed. Discussed patient with treatment team. Patient appeared on [address], calm, cooperative, decreased psychomotor activity, some improvement on the thought blocking problems, still slow response, able to carry on a reasonable conversation today during this assessment. Patient reports improved depression, improved socialization, she stated feeling better and trying to participate in the milieu therapy and group counseling sessions. Patient states she has been sleeping okay, has no problem with appetite and taking care of herself. Patient has been compliant with her medication without adverse affects. Patient denies current symptoms of mania, hallucinations, delusions and paranoia. Patient denies current suicidal/homicidal ideation and contracts for safety while in the hospital.   Principal Problem: Bipolar disorder, curr episode mixed, severe, with psychotic features ([place]) Diagnosis:   Patient Active Problem List   Diagnosis Date Noted  . Diabetes mellitus ([place]) [E11.9] 08/10/2016  . Bipolar disorder, curr episode mixed, severe, with psychotic features ([place]) [F31.64] 08/07/2016  . Cannabis use disorder, moderate, dependence ([place]) [F12.20] 08/07/2016  . CHOLECYSTITIS, UNSPECIFIED [K81.9] 06/12/2008  . BILIARY COLIC [C16.60] 63/07/6008  . CERUMEN IMPACTION, BILATERAL [H61.20] 01/17/2008  . PHARYNGITIS, VIRAL [J02.9] 01/17/2008  . NECK PAIN [M54.2] 01/17/2008   Total Time spent with patient: 15 minutes  Past Psychiatric History: Please see H&P.  Past Medical History:  Past Medical History:  Diagnosis Date  . Abnormal Pap smear   . Bipolar 1 disorder ([place])   . Fibroids   . IBS (irritable bowel syndrome)     Past Surgical History:  Procedure Laterality  Date  . CERVICAL BIOPSY    . CHOLECYSTECTOMY    . TUBAL LIGATION     Family History:  Family History  Problem Relation Age of Onset  . Diabetes Father   . Diabetes Paternal Grandmother   . Diabetes Maternal Grandmother   . Mental illness Cousin    Family Psychiatric  History: Please see H&P.  Social History: Please see H&P.  History  Alcohol Use  . Yes    Comment: occasional     History  Drug Use    Comment: CBD oil    Social History   Social History  . Marital status: Married    Spouse name: N/A  . Number of children: N/A  . Years of education: N/A   Social History Main Topics  . Smoking status: Current Every Day Smoker    Packs/day: 0.25    Years: 0.00    Types: Cigarettes  . Smokeless tobacco: Never Used  . Alcohol use Yes     Comment: occasional  . Drug use: Yes     Comment: CBD oil  . Sexual activity: Not Currently   Other Topics Concern  . None   Social History Narrative  . None   Additional Social History:    Sleep: Fair  Appetite:  Fair  Current Medications: Current Facility-Administered Medications  Medication Dose Route Frequency Provider Last Rate Last Dose  . acetaminophen (TYLENOL) tablet 650 mg  650 mg Oral Q6H PRN Patrecia Pour, NP   650 mg at 02/26/17 0851  . alum & mag hydroxide-simeth (MAALOX/MYLANTA) 200-200-20 MG/5ML suspension 30 mL  30 mL Oral Q4H PRN Patrecia Pour, NP      . ARIPiprazole (ABILIFY) tablet 10 mg  10 mg Oral QHS Ursula Alert, MD   10 mg at 03/02/17 2113  . benztropine (COGENTIN) tablet 0.5 mg  0.5 mg Oral QHS Eappen, Saramma, MD   0.5 mg at 03/02/17 2113  . buPROPion [facility]) tablet 100 mg  100 mg Oral Q breakfast Ambrose Finland, MD   100 mg at 03/03/17 0800  . divalproex (DEPAKOTE) DR tablet 500 mg  500 mg Oral Q12H Eappen, Saramma, MD   500 mg at 03/03/17 0759  . ferrous sulfate tablet 325 mg  325 mg Oral Q breakfast Ursula Alert, MD   325 mg at 03/03/17 0759  . loperamide (IMODIUM)  capsule 2-4 mg  2-4 mg Oral PRN Lindon Romp A, NP   2 mg at 03/02/17 2006  . LORazepam (ATIVAN) tablet 0.5 mg  0.5 mg Oral Q6H PRN Cobos, Myer Peer, MD   0.5 mg at 03/02/17 2307  . magnesium hydroxide (MILK OF MAGNESIA) suspension 30 mL  30 mL Oral Daily PRN Patrecia Pour, NP      . metFORMIN (GLUCOPHAGE) tablet 500 mg  500 mg Oral BID WC Patrecia Pour, NP   500 mg at 03/03/17 0800  . OLANZapine zydis (ZYPREXA) disintegrating tablet 5 mg  5 mg Oral TID PRN Ursula Alert, MD   5 mg at 02/27/17 1338   Or  . OLANZapine (ZYPREXA) injection 5 mg  5 mg Intramuscular TID PRN Ursula Alert, MD      . traZODone (DESYREL) tablet 150 mg  150 mg Oral QHS Eappen, Ria Clock, MD   150 mg at 03/02/17 2113   Lab Results:  Results for orders placed or performed during the hospital encounter of 02/25/17 (from the past 48 hour(s))  Glucose, capillary     Status: Abnormal   Collection Time: 03/01/17  5:08 PM  Result Value Ref Range   Glucose-Capillary 144 (H) 65 - 99 mg/dL  Glucose, capillary     Status: Abnormal   Collection Time: 03/02/17  6:13 AM  Result Value Ref Range   Glucose-Capillary 134 (H) 65 - 99 mg/dL  Glucose, capillary     Status: Abnormal   Collection Time: 03/02/17  4:57 PM  Result Value Ref Range   Glucose-Capillary 180 (H) 65 - 99 mg/dL  Glucose, capillary     Status: Abnormal   Collection Time: 03/03/17  6:21 AM  Result Value Ref Range   Glucose-Capillary 144 (H) 65 - 99 mg/dL  Valproic acid level     Status: None   Collection Time: 03/03/17  6:40 AM  Result Value Ref Range   Valproic Acid Lvl 73 50.0 - 100.0 ug/mL    Comment: Performed at [facility], [place] [address] [place], [place] [address]   Blood Alcohol level:  Lab Results  Component Value Date   [facility] <5 02/24/2017   ETH <5 95/63/8756   Metabolic Disorder Labs: Lab Results  Component Value Date   HGBA1C 7.5 (H) 02/27/2017   MPG 168.55 02/27/2017   MPG 171 08/06/2016   Lab Results   Component Value Date   PROLACTIN 105.5 (H) 02/28/2017   PROLACTIN 112.8 (H) 02/27/2017   Lab Results  Component Value Date   CHOL 148 02/28/2017   TRIG 116 02/28/2017   HDL 36 (L) 02/28/2017   CHOLHDL 4.1 02/28/2017   VLDL 23 02/28/2017   LDLCALC 89 02/28/2017   LDLCALC 106 (H) 08/06/2016   Physical Findings: AIMS: Facial and Oral Movements Muscles of Facial Expression: None, normal Lips and Perioral  Area: None, normal Jaw: None, normal Tongue: None, normal,Extremity Movements Upper (arms, wrists, hands, fingers): None, normal Lower (legs, knees, ankles, toes): None, normal, Trunk Movements Neck, shoulders, hips: None, normal, Overall Severity Severity of abnormal movements (highest score from questions above): None, normal Incapacitation due to abnormal movements: None, normal Patient's awareness of abnormal movements (rate only patient's report): No Awareness, Dental Status Current problems with teeth and/or dentures?: No Does patient usually wear dentures?: No  CIWA:    COWS:     Musculoskeletal: Strength & Muscle Tone: within normal limits Gait & Station: normal Patient leans: N/A  Psychiatric Specialty Exam: Physical Exam  Nursing note and vitals reviewed.   Review of Systems  Psychiatric/Behavioral: Positive for depression. The patient is nervous/anxious and has insomnia.   All other systems reviewed and are negative.   Blood pressure 128/64, pulse (!) 108, temperature 97.6 F (36.4 C), temperature source Oral, resp. rate 18, height 5\' 11"  (1.803 m), weight 135.2 kg (298 lb).Body mass index is 41.56 kg/m.  General Appearance: Guarded, slowly improving  Eye Contact:  Fair  Speech:  Slow, delayed responses  Volume:  Decreased, soft  Mood:  Anxious and Depressed  Affect:  Depressed  Thought Process:  Disorganized, Irrelevant and Descriptions of Associations: Tangential  Orientation:  Full (Time, Place, and Person)  Thought Content:  Delusions, Paranoid  Ideation, Rumination, Tangential and thought blocking.  Suicidal Thoughts:  No  Homicidal Thoughts:  No  Memory:  Immediate;   Fair Recent;   Fair Remote;   Fair  Judgement:  Impaired  Insight:  Shallow  Psychomotor Activity:  Normal  Concentration:  Concentration: Fair and Attention Span: Fair  Recall:  [employer] of Knowledge:  Fair  Language:  Fair  Akathisia:  No  Handed:  Right  AIMS (if indicated):     Assets:  Desire for Improvement  ADL's:  Intact  Cognition:  WNL  Sleep:  Number of Hours: 6.25   Will continue today 03/03/17  plan as below except where it is noted.  Treatment Plan Summary: Patient with bipolar do, presented as delusional, has psychomotor retardation, continues to have thought blocking, some improvement noted, as well as has sleep issues.  Will continue to make medication readjustments.  Daily contact with patient to assess and evaluate symptoms and progress in treatment, Medication management and Plan see below  -Continue Depakote DR 500 mg po bid for mood sx. Depakote level on 03/03/2017 - Saturday, result reviewed, 73 within therapeutic range. -Continue  Abilify 10 mg po qhs for augmenting the depakote as well as for mood sx. Continue Trazodone 150 mg po qhs for sleep. Continue Wellbutrin 100 mg po daily for affective sx.  Continue home medications where indicated. Continue Flagyl as scheduled per [facility] for UTI. Reviewed Iron panel - continue  Feso4 325 mg po daily with breakfast. PL level elevated - see above change - will monitor closely. Reviewed EKG - qtc - wnl. CSW will continue to work on disposition.  Encarnacion Slates, NP, 40, FNP-BC. 03/03/2017, 4:10 PMPatient ID: [name], female   DOB: December 25, 1975, 41 y.o.   MRN: [id number]

## 2017-03-03 NOTE — [facility] Group Notes (Signed)
Adult Psychoeducational Group Note  Date:  03/03/2017 Time:  8:00pm Group Topic/Focus:  Wrap-Up Group:   The focus of this group is to help patients review their daily goal of treatment and discuss progress on daily workbooks.  Participation Level:  Active  Participation Quality:  Appropriate  Affect:  Appropriate  Cognitive:  Alert and Appropriate  Insight: Appropriate  Engagement in Group:  Engaged  Modes of Intervention:  Discussion  Additional Comments:  Pt was attentive and appropriate during tonights group discussion. Pt stated that he was able to work on coping skills and the [facility] ladies in group.   Theodoro Grist D 03/03/2017, 9:38 PM

## 2017-03-03 NOTE — [facility] Group Notes (Signed)
  [facility] LCSW Group Therapy Note  Date/Time:  03/03/2017 11:15AM-12:00PM  Type of Therapy and Topic:  Group Therapy:  Feelings About Hospitalization  Participation Level:  Active   Description of Group This process group involved patients discussing their feelings related to being hospitalized, as well as the benefits they see to being in the hospital.  These feelings and benefits were itemized.  The group then brainstormed specific ways in which they could seek those same benefits when they discharge and return home.  Therapeutic Goals 1. Patient will identify and describe positive and negative feelings related to hospitalization 2. Patient will verbalize benefits of hospitalization to themselves personally 3. Patients will brainstorm together ways they can obtain similar benefits in the outpatient setting, identify barriers to wellness and possible solutions  Summary of Patient Progress:  The patient expressed her primary feelings about being hospitalized are that she feels positive and uplifted, especially because she really feels she has commonalities with other group members.  She was very interactive and encouraging to others throughout group.  Therapeutic Modalities Cognitive Behavioral Therapy Motivational Interviewing    Selmer Dominion, LCSW 03/03/2017, 1:05 PM  ;l

## 2017-03-03 NOTE — Plan of Care (Signed)
Problem: Activity: Goal: Sleeping patterns will improve Outcome: Progressing Reports fair sleep although the 15 minute checks make it difficult for her  Problem: Education: Goal: Knowledge of [facility] Education information/materials will improve Outcome: Completed/Met Date Met: 03/03/17 Patient knowledgeable about [facility] information and materials.  Problem: Health Behavior/Discharge Planning: Goal: Identification of resources available to assist in meeting health care needs will improve Outcome: Progressing Patient is involved with [facility] as outpatient and intends to return there this week.  Problem: Safety: Goal: Periods of time without injury will increase Outcome: Completed/Met Date Met: 03/03/17 No self injurious behavior or other injury danger present.  Problem: Activity: Goal: Will identify at least one activity in which they can participate Outcome: Progressing Patient identified journaling today as an activity that is a good coping skill for her.  Problem: Coping: Goal: Ability to identify and develop effective coping behavior will improve Outcome: Progressing Patient explored coping skills and behavior for preventing future hospitalization.  Goal: Ability to interact with others will improve Outcome: Progressing Present and interactive with peers and staff, not isolating.  Goal: Participation in decision-making will improve Outcome: Progressing Psychomotor and cognitive slowing improving. 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

## 2017-03-03 NOTE — Plan of Care (Signed)
Problem: Activity: Goal: Sleeping patterns will improve Outcome: Progressing Pt slept 6.25 hours last night according to flowsheet.

## 2017-03-03 NOTE — Plan of Care (Signed)
Problem: Coping: Goal: Ability to identify and develop effective coping behavior will improve Outcome: Progressing Pt journals to express her thoughts and manage her negative thoughts

## 2017-03-04 LAB — GLUCOSE, CAPILLARY
Glucose-Capillary: 134 mg/dL — ABNORMAL HIGH (ref 65–99)
Glucose-Capillary: 158 mg/dL — ABNORMAL HIGH (ref 65–99)

## 2017-03-04 NOTE — Progress Notes (Signed)
D: Pt was in the dayroom upon initial approach.  Pt presents with preoccupied affect and mood.  She continues to have some thought blocking.  Describes her day as "good, a little rocky but I made it through."  Pt reports she has been "reflecting on what I've been doing instead of being so anxious about being here too long."  She reports she "sang, talked to a supportive person, and read the bible."  Pt denies SI/HI, denies hallucinations, denies pain.  Pt has been visible in milieu interacting with peers and staff appropriately.  Pt attended evening group.    A: Introduced self to pt.  Actively listened to pt and offered support and encouragement. Medications administered per order.  Q15 minute safety checks maintained.  R: Pt is safe on the unit.  Pt is compliant with medications.  Pt verbally contracts for safety.  Will continue to monitor and assess.

## 2017-03-04 NOTE — Progress Notes (Signed)
[facility] MD Progress Note  03/04/2017 2:15 PM Zeniyah Peaster  MRN:  [id number]   Subjective: Ceaira reports, "I'm doing fine today. It is just that everybody else is thinking otherwise. I don't want to talk too much but I feel like I'm doing better to start talking about discharge. I talked to a friend of mine yesterday evening. I was able to call & talk to my family. Today is just a regular day. I have taken my medicines, my vital signs were checked, I went to breakfast & I'm attending groups. That's about it so far".  Objective: Patient seen and chart reviewed. Discussed patient with treatment team. Patient is visible on the unit. Presents calm, cooperative, no psychomotor activity today. She is showing more improvement. Her thought blocking episodes are mild today with improved response to questions. She is able to carry on a reasonable conversation today during this assessment. Patient reports improved depression, improved socialization, she stated feeling better and trying to participate in the milieu therapy and group counseling sessions. Patient states she has been sleeping okay, has no problem with appetite and taking care of herself. Patient has been compliant with her medication without adverse affects. Patient denies current symptoms of mania, hallucinations, delusions and paranoia. Patient denies current suicidal/homicidal ideation and contracts for safety while in the hospital. She is now thinking about discussing discharge. Good eye contact. Affect is positively reactive. Her thought process is clear & meaninful.  Principal Problem: Bipolar disorder, curr episode mixed, severe, with psychotic features ([place]) Diagnosis:   Patient Active Problem List   Diagnosis Date Noted  . Diabetes mellitus ([place]) [E11.9] 08/10/2016  . Bipolar disorder, curr episode mixed, severe, with psychotic features ([place]) [F31.64] 08/07/2016  . Cannabis use disorder, moderate, dependence ([place]) [F12.20] 08/07/2016  .  CHOLECYSTITIS, UNSPECIFIED [K81.9] 06/12/2008  . BILIARY COLIC [I09.73] 53/29/9242  . CERUMEN IMPACTION, BILATERAL [H61.20] 01/17/2008  . PHARYNGITIS, VIRAL [J02.9] 01/17/2008  . NECK PAIN [M54.2] 01/17/2008   Total Time spent with patient: 15 minutes  Past Psychiatric History: Please see H&P.  Past Medical History:  Past Medical History:  Diagnosis Date  . Abnormal Pap smear   . Bipolar 1 disorder ([place])   . Fibroids   . IBS (irritable bowel syndrome)     Past Surgical History:  Procedure Laterality Date  . CERVICAL BIOPSY    . CHOLECYSTECTOMY    . TUBAL LIGATION     Family History:  Family History  Problem Relation Age of Onset  . Diabetes Father   . Diabetes Paternal Grandmother   . Diabetes Maternal Grandmother   . Mental illness Cousin    Family Psychiatric  History: Please see H&P.  Social History: Please see H&P.  History  Alcohol Use  . Yes    Comment: occasional     History  Drug Use    Comment: CBD oil    Social History   Social History  . Marital status: Married    Spouse name: N/A  . Number of children: N/A  . Years of education: N/A   Social History Main Topics  . Smoking status: Current Every Day Smoker    Packs/day: 0.25    Years: 0.00    Types: Cigarettes  . Smokeless tobacco: Never Used  . Alcohol use Yes     Comment: occasional  . Drug use: Yes     Comment: CBD oil  . Sexual activity: Not Currently   Other Topics Concern  . None   Social History Narrative  .  None   Additional Social History:    Sleep: Good  Appetite:  Fair  Current Medications: Current Facility-Administered Medications  Medication Dose Route Frequency Provider Last Rate Last Dose  . acetaminophen (TYLENOL) tablet 650 mg  650 mg Oral Q6H PRN Patrecia Pour, NP   650 mg at 02/26/17 0851  . alum & mag hydroxide-simeth (MAALOX/MYLANTA) 200-200-20 MG/5ML suspension 30 mL  30 mL Oral Q4H PRN Patrecia Pour, NP      . ARIPiprazole (ABILIFY) tablet 10 mg  10  mg Oral QHS Ursula Alert, MD   10 mg at 03/03/17 2121  . benztropine (COGENTIN) tablet 0.5 mg  0.5 mg Oral QHS Eappen, Saramma, MD   0.5 mg at 03/03/17 2121  . buPROPion [facility]) tablet 100 mg  100 mg Oral Q breakfast Ambrose Finland, MD   100 mg at 03/04/17 0910  . divalproex (DEPAKOTE) DR tablet 500 mg  500 mg Oral Q12H Eappen, Saramma, MD   500 mg at 03/04/17 0910  . ferrous sulfate tablet 325 mg  325 mg Oral Q breakfast Ursula Alert, MD   325 mg at 03/04/17 0910  . loperamide (IMODIUM) capsule 2-4 mg  2-4 mg Oral PRN Lindon Romp A, NP   2 mg at 03/02/17 2006  . LORazepam (ATIVAN) tablet 0.5 mg  0.5 mg Oral Q6H PRN Cobos, Myer Peer, MD   0.5 mg at 03/02/17 2307  . magnesium hydroxide (MILK OF MAGNESIA) suspension 30 mL  30 mL Oral Daily PRN Patrecia Pour, NP      . metFORMIN (GLUCOPHAGE) tablet 500 mg  500 mg Oral BID WC Patrecia Pour, NP   500 mg at 03/04/17 0909  . OLANZapine zydis (ZYPREXA) disintegrating tablet 5 mg  5 mg Oral TID PRN Ursula Alert, MD   5 mg at 02/27/17 1338   Or  . OLANZapine (ZYPREXA) injection 5 mg  5 mg Intramuscular TID PRN Ursula Alert, MD      . traZODone (DESYREL) tablet 150 mg  150 mg Oral QHS Eappen, Ria Clock, MD   150 mg at 03/03/17 2121   Lab Results:  Results for orders placed or performed during the hospital encounter of 02/25/17 (from the past 48 hour(s))  Glucose, capillary     Status: Abnormal   Collection Time: 03/02/17  4:57 PM  Result Value Ref Range   Glucose-Capillary 180 (H) 65 - 99 mg/dL  Glucose, capillary     Status: Abnormal   Collection Time: 03/03/17  6:21 AM  Result Value Ref Range   Glucose-Capillary 144 (H) 65 - 99 mg/dL  Valproic acid level     Status: None   Collection Time: 03/03/17  6:40 AM  Result Value Ref Range   Valproic Acid Lvl 73 50.0 - 100.0 ug/mL    Comment: Performed at [facility], [place] [address], [place], [place] [address]  Glucose, capillary     Status: Abnormal    Collection Time: 03/03/17  4:17 PM  Result Value Ref Range   Glucose-Capillary 157 (H) 65 - 99 mg/dL  Glucose, capillary     Status: Abnormal   Collection Time: 03/04/17  6:11 AM  Result Value Ref Range   Glucose-Capillary 134 (H) 65 - 99 mg/dL   Blood Alcohol level:  Lab Results  Component Value Date   ETH <5 02/24/2017   ETH <5 13/01/6577   Metabolic Disorder Labs: Lab Results  Component Value Date   HGBA1C 7.5 (H) 02/27/2017   MPG 168.55  02/27/2017   MPG 171 08/06/2016   Lab Results  Component Value Date   PROLACTIN 105.5 (H) 02/28/2017   PROLACTIN 112.8 (H) 02/27/2017   Lab Results  Component Value Date   CHOL 148 02/28/2017   TRIG 116 02/28/2017   HDL 36 (L) 02/28/2017   CHOLHDL 4.1 02/28/2017   VLDL 23 02/28/2017   LDLCALC 89 02/28/2017   LDLCALC 106 (H) 08/06/2016   Physical Findings: AIMS: Facial and Oral Movements Muscles of Facial Expression: None, normal Lips and Perioral Area: None, normal Jaw: None, normal Tongue: None, normal,Extremity Movements Upper (arms, wrists, hands, fingers): None, normal Lower (legs, knees, ankles, toes): None, normal, Trunk Movements Neck, shoulders, hips: None, normal, Overall Severity Severity of abnormal movements (highest score from questions above): None, normal Incapacitation due to abnormal movements: None, normal Patient's awareness of abnormal movements (rate only patient's report): No Awareness, Dental Status Current problems with teeth and/or dentures?: No Does patient usually wear dentures?: No  CIWA:    COWS:     Musculoskeletal: Strength & Muscle Tone: within normal limits Gait & Station: normal Patient leans: N/A  Psychiatric Specialty Exam: Physical Exam  Nursing note and vitals reviewed.   Review of Systems  Psychiatric/Behavioral: Positive for depression. The patient is nervous/anxious and has insomnia.   All other systems reviewed and are negative.   Blood pressure 124/74, pulse 99,  temperature 98.7 F (37.1 C), temperature source Oral, resp. rate 20, height 5\' 11"  (1.803 m), weight 135.2 kg (298 lb).Body mass index is 41.56 kg/m.  General Appearance: Guarded, slowly improving  Eye Contact:  Fair  Speech:  Slow, improved responses  Volume:  Decreased, soft  Mood:  Improving  Affect:  Reactive  Thought Process:  Coherent, Goal Directed, Linear and Descriptions of Associations: Intact  Orientation:  Full (Time, Place, and Person)  Thought Content:  Mild thought blocking, ruminations, denies hallucination, delusions or paranoia  Suicidal Thoughts:  Denies  Homicidal Thoughts:  Denies  Memory:  Immediate;   Good Recent;   Fair Remote;   Fair  Judgement:  Fair  Insight:  Fair  Psychomotor Activity:  Normal  Concentration:  Concentration: Fair and Attention Span: Fair  Recall:  [employer] of Knowledge:  Fair  Language:  Fair  Akathisia:  No  Handed:  Right  AIMS (if indicated):     Assets:  Desire for Improvement  ADL's:  Intact  Cognition:  WNL  Sleep:  Number of Hours: 6.75   Will continue today 03/04/17  plan as below except where it is noted.  Treatment Plan Summary: Patient with bipolar do, presented as delusional, has psychomotor retardation, continues to have thought blocking, some improvement noted, as well as has sleep issues.  Will continue to make medication readjustments.  Daily contact with patient to assess and evaluate symptoms and progress in treatment, Medication management and Plan see below  -Continue Depakote DR 500 mg po bid for mood sx. Depakote level on 03/03/2017 - Saturday, result reviewed, 73 within therapeutic range. -Continue  Abilify 10 mg po qhs for augmenting the depakote as well as for mood sx. Continue Trazodone 150 mg po qhs for sleep. Continue Wellbutrin 100 mg po daily for affective sx.  Continue home medications where indicated. Continue Flagyl as scheduled per [facility] for UTI. Reviewed Iron panel - continue  Feso4 325 mg  po daily with breakfast. PL level elevated - see above change - will monitor closely. Reviewed EKG - qtc - wnl. CSW will continue to  work on disposition. Piscopo wants to discuss discharge, she feels ready to go home)  Encarnacion Slates, NP, PMHNP, FNP-BC. 03/04/2017, 2:15 PMPatient ID: [name], female   DOB: 09-20-1975, 41 y.o.   MRN: [id number]

## 2017-03-04 NOTE — Progress Notes (Signed)
Patient attended group ane said that her day was a 8.  Her coping skills were reading and socializing.

## 2017-03-04 NOTE — Progress Notes (Signed)
D: Patient's self inventory sheet: patient has good sleep, without sleep medication.good  Appetite, normal energy level, good concentration. Rated depression 0/10, hopeless 0/10, anxiety 2/10. SI/HI/AVH: Denies all. Physical complaints are denied. Goal is "discharge plan, coping skills, healthy eating.. Plans to work on "OI will stay on schedule, eat right, take meds".   A: Medications administered, assessed medication knowledge and education given on medication regimen.  Emotional support and encouragement given patient. R: Denies SI and HI , contracts for safety. Safety maintained with 15 minute checks.

## 2017-03-04 NOTE — BHH Group Notes (Signed)
[facility] LCSW Group Therapy Note  Date/Time:  03/04/2017  11:00AM-12:00PM  Type of Therapy and Topic:  Group Therapy:  Music and Mood  Participation Level:  Active   Description of Group: In this process group, members listened to a variety of genres of music and identified that different types of music evoke different responses.  Patients were encouraged to identify music that was soothing for them and music that was energizing for them.  Patients discussed how this knowledge can help with wellness and recovery in various ways including managing depression and anxiety as well as encouraging healthy sleep habits.    Therapeutic Goals: 1. Patients will explore the impact of different varieties of music on mood 2. Patients will verbalize the thoughts they have when listening to different types of music 3. Patients will identify music that is soothing to them as well as music that is energizing to them 4. Patients will discuss how to use this knowledge to assist in maintaining wellness and recovery 5. Patients will explore the use of music as a coping skill  Summary of Patient Progress:  At the beginning of group, patient expressed she felt "fine" and wanted to stay calm.  At the end of group when she sat through all of the music with very little change in affect, she stated she continued to feel calm.  Therapeutic Modalities: Solution Focused Brief Therapy Motivational Interviewing Activity   Selmer Dominion, LCSW 03/04/2017 2:28 PM

## 2017-03-05 LAB — GLUCOSE, CAPILLARY: Glucose-Capillary: 143 mg/dL — ABNORMAL HIGH (ref 65–99)

## 2017-03-05 MED ORDER — DIVALPROEX SODIUM 500 MG PO DR TAB: 500.0000 mg | DELAYED_RELEASE_TABLET | Freq: Two times a day (BID) | ORAL | 0 refills | Status: DC

## 2017-03-05 MED ORDER — BENZTROPINE MESYLATE 0.5 MG PO TABS: 0.5000 mg | ORAL_TABLET | Freq: Every day | ORAL | 0 refills | Status: DC

## 2017-03-05 MED ORDER — METFORMIN HCL 500 MG PO TABS: 500.0000 mg | ORAL_TABLET | Freq: Two times a day (BID) | ORAL | 0 refills | Status: DC

## 2017-03-05 MED ORDER — TRAZODONE HCL 150 MG PO TABS: 150.0000 mg | ORAL_TABLET | Freq: Every day | ORAL | 0 refills | Status: DC

## 2017-03-05 MED ORDER — ARIPIPRAZOLE ER 400 MG IM SRER
400.0000 mg | INTRAMUSCULAR | Status: DC
  Administered 2017-03-05: 400 mg via INTRAMUSCULAR

## 2017-03-05 MED ORDER — BUPROPION HCL 100 MG PO TABS: 100.0000 mg | ORAL_TABLET | Freq: Every day | ORAL | 0 refills | Status: DC

## 2017-03-05 MED ORDER — ARIPIPRAZOLE 10 MG PO TABS: 10.0000 mg | ORAL_TABLET | Freq: Every day | ORAL | 0 refills | Status: DC

## 2017-03-05 MED ORDER — FERROUS SULFATE 325 (65 FE) MG PO TABS: 325.0000 mg | ORAL_TABLET | Freq: Every day | ORAL | 0 refills | Status: DC

## 2017-03-05 MED ORDER — ARIPIPRAZOLE ER 400 MG IM SRER: 400.0000 mg | INTRAMUSCULAR | 0 refills | Status: DC

## 2017-03-05 NOTE — Progress Notes (Signed)
Recreation Therapy Notes  INPATIENT RECREATION TR PLAN  Patient Details Name: [name] MRN: [id number] DOB: [birth year] Today's Date: 03/05/2017  Rec Therapy Plan Is patient appropriate for Therapeutic Recreation?: Yes Treatment times per week: about 3 days Estimated Length of Stay: 5-7 days TR Treatment/Interventions: Group participation (Comment)  Discharge Criteria Pt will be discharged from therapy if:: Discharged Treatment plan/goals/alternatives discussed and agreed upon by:: Patient/family  Discharge Summary Short term goals set: Patient will be able to identify at least 5 coping skills for anxiety by conclusion of recreation therapy tx  Short term goals met: Complete Progress toward goals comments: Groups attended Which groups?: Self-esteem, Coping skills, Leisure education, Goal setting, Communication, Wellness Reason goals not met: None Therapeutic equipment acquired: N/A Reason patient discharged from therapy: Discharge from hospital Pt/family agrees with progress & goals achieved: Yes Date patient discharged from therapy: 03/05/17    Victorino Sparrow, LRT/CTRS   Ria Comment, [place] 03/05/2017, 12:41 PM

## 2017-03-05 NOTE — BHH Suicide Risk Assessment (Addendum)
[facility] Discharge Suicide Risk Assessment   Principal Problem: Bipolar disorder, curr episode mixed, severe, with psychotic features [facility]) Discharge Diagnoses:  Patient Active Problem List   Diagnosis Date Noted  . Diabetes mellitus ([place]) [E11.9] 08/10/2016  . Bipolar disorder, curr episode mixed, severe, with psychotic features ([place]) [F31.64] 08/07/2016  . Cannabis use disorder, moderate, dependence ([place]) [F12.20] 08/07/2016  . CHOLECYSTITIS, UNSPECIFIED [K81.9] 06/12/2008  . BILIARY COLIC [E49.75] 30/10/1100  . CERUMEN IMPACTION, BILATERAL [H61.20] 01/17/2008  . PHARYNGITIS, VIRAL [J02.9] 01/17/2008  . NECK PAIN [M54.2] 01/17/2008    Total Time spent with patient: 30 minutes  Musculoskeletal: Strength & Muscle Tone: within normal limits Gait & Station: normal Patient leans: N/A  Psychiatric Specialty Exam: Review of Systems  Psychiatric/Behavioral: Negative for depression and suicidal ideas.  All other systems reviewed and are negative.   Blood pressure 127/71, pulse 89, temperature 99 F (37.2 C), resp. rate 18, height 5\' 11"  (1.803 m), weight 135.2 kg (298 lb).Body mass index is 41.56 kg/m.  General Appearance: Casual  Eye Contact::  Fair  Speech:  Clear and Coherent409  Volume:  Normal  Mood:  Euthymic  Affect:  Appropriate  Thought Process:  Goal Directed and Descriptions of Associations: Intact  Orientation:  Full (Time, Place, and Person)  Thought Content:  Logical  Suicidal Thoughts:  No  Homicidal Thoughts:  No  Memory:  Immediate;   Fair Recent;   Fair Remote;   Fair  Judgement:  Fair  Insight:  Fair  Psychomotor Activity:  Normal  Concentration:  Fair  Recall:  [employer] of Knowledge:Fair  Language: Fair  Akathisia:  No  Handed:  Right  AIMS (if indicated):   0  Assets:  Communication Skills Desire for Improvement  Sleep:  Number of Hours: 6  Cognition: WNL  ADL's:  Intact   Mental Status Per Nursing Assessment::   On Admission:   NA  Demographic Factors:  NA  Loss Factors: NA  Historical Factors: Impulsivity  Risk Reduction Factors:   Positive social support and Positive therapeutic relationship  Continued Clinical Symptoms:  Previous Psychiatric Diagnoses and Treatments  Cognitive Features That Contribute To Risk:  None    Suicide Risk:  Minimal: No identifiable suicidal ideation.  Patients presenting with no risk factors but with morbid ruminations; may be classified as minimal risk based on the severity of the depressive symptoms  Follow-up Information    [facility] Follow up on 03/08/2017.   Specialty:  Behavioral Health Why:  Thursday at 11:45AM for a noon appointment with Vara Guardian information: [place] [address] [phone]           Plan Of Care/Follow-up recommendations:  Activity:  no restrictions Diet:  carb modified Tests:  as needed Other:  follow up with aftercare  Sequoya Hogsett, MD 03/05/2017, 9:59 AM

## 2017-03-05 NOTE — Progress Notes (Signed)
  [facility] Adult Case Management Discharge Plan :  Will you be returning to the same living situation after discharge:  Yes,  home At discharge, do you have transportation home?: Yes,  family member or bus Do you have the ability to pay for your medications: Yes,  private insurance  Release of information consent forms completed and submitted to medical records by CSW.  Patient to Follow up at: Follow-up Information    [facility] Follow up on 03/08/2017.   Specialty:  Behavioral Health Why:  Thursday at 11:45AM for a noon appointment with Vara Guardian information: [place] [address] [phone]           Next level of care provider has access to [place] and Suicide Prevention discussed: Yes,  SPE completed with pt; pt declined to consent to family contact. SPI pamphlet and mobile Crisis information provided.  Have you used any form of tobacco in the last 30 days? (Cigarettes, Smokeless Tobacco, Cigars, and/or Pipes): No  Has patient been referred to the Quitline?: N/A patient is not a smoker  Patient has been referred for addiction treatment: Yes  [employer], LCSW 03/05/2017, 9:39 AM

## 2017-03-05 NOTE — Progress Notes (Signed)
Recreation Therapy Notes  Date: 03/05/17 Time: 1000 Location: 500 Hall Dayroom  Group Topic: Coping Skills  Goal Area(s) Addresses:  Patient will be able to identify positive coping skills. Patient will be able to identify benefits of using positive coping skills post d/c.  Behavioral Response: Engaged  Intervention: Environmental consultant, pencils  Activity: Building surveyor.  Patients were given a blank Building surveyor.  Patients were to imagine themselves being stuck inside the spider web.  Patients were to then label the things that have kept them "stuck" within the web.  Once patient identified those things, they were to come up with at least three coping skills for the situations they identified.  Education: Radiographer, therapeutic, Dentist.   Education Outcome: Acknowledges understanding/In group clarification offered/Needs additional education.   Clinical Observations/Feedback: Pt stated she has been going through a divorce and hanging around the wrong people. Pt acknowledged she needs to stay positive, deal with things while sober and be accountable for her actions.  Pt stated using coping skills such as praying and meditating will help her "tackle the road blocks and deal with my problems one by one".  Pt also acknowledged she needs to compromise.    Victorino Sparrow, LRT/CTRS        Victorino Sparrow A 03/05/2017 11:24 AM

## 2017-03-05 NOTE — Progress Notes (Signed)
Pt discharged home with her cousin. Pt was ambulatory, stable and appreciative at that time. All papers and prescriptions were given and valuables returned. Verbal understanding expressed. Denies SI/HI and A/VH. Pt given opportunity to express concerns and ask questions.

## 2017-03-05 NOTE — Plan of Care (Signed)
Problem: [facility] Participation in Recreation Therapeutic Interventions Goal: STG-Patient will identify at least five coping skills for ** STG: Coping Skills - Patient will be able to identify at least 5 coping skills for anxiety by conclusion of recreation therapy tx  Outcome: Completed/Met Date Met: 03/05/17 Pt was able to identify coping skills at completion of coping skills and goals recreation therapy sessions.  Victorino Sparrow, LRT/CTRS

## 2017-03-05 NOTE — Discharge Summary (Signed)
Physician Discharge Summary Note  Patient:  [name] is an 41 y.o., female MRN:  [id number] DOB:  1976-01-23 Patient phone:  [phone] (home)  Patient address:   [address] [place] [address],  Total Time spent with patient: 20 minutes  Date of Admission:  02/25/2017 Date of Discharge: 03/05/17   Reason for Admission:  Increasing psychosis  Principal Problem: Bipolar disorder, curr episode mixed, severe, with psychotic features [facility]) Discharge Diagnoses: Patient Active Problem List   Diagnosis Date Noted  . Diabetes mellitus ([place]) [E11.9] 08/10/2016  . Bipolar disorder, curr episode mixed, severe, with psychotic features ([place]) [F31.64] 08/07/2016  . Cannabis use disorder, moderate, dependence ([place]) [F12.20] 08/07/2016  . CHOLECYSTITIS, UNSPECIFIED [K81.9] 06/12/2008  . BILIARY COLIC [V40.08] 67/61/9509  . CERUMEN IMPACTION, BILATERAL [H61.20] 01/17/2008  . PHARYNGITIS, VIRAL [J02.9] 01/17/2008  . NECK PAIN [M54.2] 01/17/2008    Past Psychiatric History: Bipolar disorder  Past Medical History:  Past Medical History:  Diagnosis Date  . Abnormal Pap smear   . Bipolar 1 disorder ([place])   . Fibroids   . IBS (irritable bowel syndrome)     Past Surgical History:  Procedure Laterality Date  . CERVICAL BIOPSY    . CHOLECYSTECTOMY    . TUBAL LIGATION     Family History:  Family History  Problem Relation Age of Onset  . Diabetes Father   . Diabetes Paternal Grandmother   . Diabetes Maternal Grandmother   . Mental illness Cousin    Family Psychiatric  History: Denies Social History:  History  Alcohol Use  . Yes    Comment: occasional     History  Drug Use    Comment: CBD oil    Social History   Social History  . Marital status: Married    Spouse name: N/A  . Number of children: N/A  . Years of education: N/A   Social History Main Topics  . Smoking status: Current Every Day Smoker    Packs/day: 0.25    Years: 0.00    Types:  Cigarettes  . Smokeless tobacco: Never Used  . Alcohol use Yes     Comment: occasional  . Drug use: Yes     Comment: CBD oil  . Sexual activity: Not Currently   Other Topics Concern  . None   Social History Narrative  . None    Hospital Course:   41 y.o.separated femalewho presents to [facility] accompanied by her cousin, who did not participate in assessment. Pt's medical record indicates Pt has a history of bipolar disorder with psychotic symptoms, altered mental status and thought blocking. Pt states her family encouraged her to come to [facility]. Pt says she is scheduled to receive a "haldol shot" in two days. Pt stares, has difficulty answering questions and is a poor historian. She says she feels "judged" when she is alone in her home. She says "I second guess myself" and acknowledges that she feels confused. She says she cannot concentration because "there are too many distractions." When asked if she was experiencing auditory or visual hallucinations Pt appeared to not understand the question despite it being rephrased in different ways. Pt denies current suicidal ideation. She denies thoughts of wanting to harm others. She states she has not slept well for several days. Pt reports she drinks alcohol occasionally and smokes marijuana 3-4 times per week. She denies other substance use. Pt says she lives with her two children, ages 42 and 73. She says she  is going through a divorce. 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 Pt reports she has been psychiatrically hospitalized at [facility] and [place] but Pt cannot remember dates. Pt is dressed in shorts and a t-shirt. She isalert, oriented to person, place and situation but not date. Speech is of slow rate. Pt hasnormal motor behavior. Eye contact is staring. Pt's mood is depressed and anxious;affect is blunted. Thought process is tangential and Pt appears to be experiencing thought blocking. Pt's insight is limited and  judgment is impaired. Pt was generally cooperative throughout assessment. She states she would prefer outpatient treatment. Patient stayed at [facility] for 8 days and was started on Risperidone and continued the  Depakote. The Haldol was stopped. Patient continued to have thought blocking, psychomotor retardation, and paranoid delusions. Patient did not respond appropriately to Risperidone and the patient was started on Abilify 10 mg PO Daily and was given the Abilify Maintena 400 mg IM injection on 03/05/17 before discharge. Patient continued to stabilize with Depakote continued and Wellbutrin started as well. Patient has follow up appointments arranged and was provided medication prescriptions. Patient denies any SI/HI/AVH upon discharge. Patient is scheduled to follow up at [facility].  Physical Findings: AIMS: Facial and Oral Movements Muscles of Facial Expression: None, normal Lips and Perioral Area: None, normal Jaw: None, normal Tongue: None, normal,Extremity Movements Upper (arms, wrists, hands, fingers): None, normal Lower (legs, knees, ankles, toes): None, normal, Trunk Movements Neck, shoulders, hips: None, normal, Overall Severity Severity of abnormal movements (highest score from questions above): None, normal Incapacitation due to abnormal movements: None, normal Patient's awareness of abnormal movements (rate only patient's report): No Awareness, Dental Status Current problems with teeth and/or dentures?: No Does patient usually wear dentures?: No  CIWA:    COWS:     Musculoskeletal: Strength & Muscle Tone: within normal limits Gait & Station: normal Patient leans: N/A  Psychiatric Specialty Exam: Physical Exam  Nursing note and vitals reviewed. Constitutional: She is oriented to person, place, and time. She appears well-developed and well-nourished.  Cardiovascular: Normal rate.   Respiratory: Effort normal.  Musculoskeletal: Normal range of motion.  Neurological: She is alert  and oriented to person, place, and time.  Skin: Skin is warm.    Review of Systems  Constitutional: Negative.   HENT: Negative.   Eyes: Negative.   Respiratory: Negative.   Cardiovascular: Negative.   Gastrointestinal: Negative.   Genitourinary: Negative.   Musculoskeletal: Negative.   Skin: Negative.   Neurological: Negative.   Endo/Heme/Allergies: Negative.     Blood pressure 127/71, pulse 89, temperature 99 F (37.2 C), resp. rate 18, height 5\' 11"  (1.803 m), weight 135.2 kg (298 lb).Body mass index is 41.56 kg/m.  General Appearance: Casual  Eye Contact:  Good  Speech:  Clear and Coherent and Normal Rate  Volume:  Normal  Mood:  Euthymic  Affect:  Appropriate  Thought Process:  Goal Directed and Descriptions of Associations: Intact  Orientation:  Full (Time, Place, and Person)  Thought Content:  WDL  Suicidal Thoughts:  No  Homicidal Thoughts:  No  Memory:  Immediate;   Good Recent;   Good Remote;   Good  Judgement:  Fair  Insight:  Fair  Psychomotor Activity:  Normal  Concentration:  Concentration: Fair and Attention Span: Fair  Recall:  [employer] of Knowledge:  Fair  Language:  Fair  Akathisia:  No  Handed:  Right  AIMS (if indicated):     Assets:  Communication Skills  Desire for Improvement  ADL's:  Intact  Cognition:  WNL  Sleep:  Number of Hours: 6     Have you used any form of tobacco in the last 30 days? (Cigarettes, Smokeless Tobacco, Cigars, and/or Pipes): No  Has this patient used any form of tobacco in the last 30 days? (Cigarettes, Smokeless Tobacco, Cigars, and/or Pipes) Yes, No  Blood Alcohol level:  Lab Results  Component Value Date   ETH <5 02/24/2017   ETH <5 56/38/7564    Metabolic Disorder Labs:  Lab Results  Component Value Date   HGBA1C 7.5 (H) 02/27/2017   MPG 168.55 02/27/2017   MPG 171 08/06/2016   Lab Results  Component Value Date   PROLACTIN 105.5 (H) 02/28/2017   PROLACTIN 112.8 (H) 02/27/2017   Lab Results   Component Value Date   CHOL 148 02/28/2017   TRIG 116 02/28/2017   HDL 36 (L) 02/28/2017   CHOLHDL 4.1 02/28/2017   VLDL 23 02/28/2017   LDLCALC 89 02/28/2017   LDLCALC 106 (H) 08/06/2016    See Psychiatric Specialty Exam and Suicide Risk Assessment completed by Attending Physician prior to discharge.  Discharge destination:  Home  Is patient on multiple antipsychotic therapies at discharge:  No   Has Patient had three or more failed trials of antipsychotic monotherapy by history:  No  Recommended Plan for Multiple Antipsychotic Therapies: NA   Allergies as of 03/05/2017   No Known Allergies     Medication List    STOP taking these medications   divalproex 250 MG 24 hr tablet Commonly known as:  DEPAKOTE ER Replaced by:  divalproex 500 MG DR tablet   haloperidol 5 MG tablet Commonly known as:  HALDOL   haloperidol decanoate 100 MG/ML injection Commonly known as:  HALDOL DECANOATE   ibuprofen 200 MG tablet Commonly known as:  ADVIL,MOTRIN   loperamide 2 MG tablet Commonly known as:  IMODIUM A-D   metroNIDAZOLE 500 MG tablet Commonly known as:  FLAGYL     TAKE these medications     Indication  ARIPiprazole 10 MG tablet Commonly known as:  ABILIFY Take 1 tablet (10 mg total) by mouth at bedtime. For mood control  Indication:  mood stability   ARIPiprazole ER 400 MG Srer Inject 400 mg into the muscle every 30 (thirty) days. For mood control next injection due 04/02/17  Indication:  mood stability   benztropine 0.5 MG tablet Commonly known as:  COGENTIN Take 1 tablet (0.5 mg total) by mouth at bedtime. For side effects to medications What changed:  when to take this  additional instructions  Indication:  Extrapyramidal Reaction caused by Medications   buPROPion 100 MG tablet Commonly known as:  WELLBUTRIN Take 1 tablet (100 mg total) by mouth daily with breakfast. For mood control  Indication:  Major Depressive Disorder   divalproex 500 MG DR  tablet Commonly known as:  DEPAKOTE Take 1 tablet (500 mg total) by mouth every 12 (twelve) hours. For mood control Replaces:  divalproex 250 MG 24 hr tablet  Indication:  mood stability   ferrous sulfate 325 (65 FE) MG tablet Take 1 tablet (325 mg total) by mouth daily with breakfast. For iron level  Indication:  Iron Deficiency   metFORMIN 500 MG tablet Commonly known as:  GLUCOPHAGE Take 1 tablet (500 mg total) by mouth 2 (two) times daily with a meal. For diabetes What changed:  additional instructions  Indication:  Type 2 Diabetes   traZODone 150 MG tablet  Commonly known as:  DESYREL Take 1 tablet (150 mg total) by mouth at bedtime. For mood control and sleep What changed:  medication strength  how much to take  when to take this  reasons to take this  additional instructions  Indication:  [place], Major Depressive Disorder      Follow-up Information    [facility] Follow up on 03/08/2017.   Specialty:  Behavioral Health Why:  Thursday at 11:45AM for a noon appointment with Vara Guardian information: [place] [address] [phone]           Follow-up recommendations:  Continue activity as tolerated. Continue diet as recommended by your PCP. Ensure to keep all appointments with outpatient providers.  Comments:  Patient is instructed prior to discharge to: Take all medications as prescribed by his/her mental healthcare provider. Report any adverse effects and or reactions from the medicines to his/her outpatient provider promptly. Patient has been instructed & cautioned: To not engage in alcohol and or illegal drug use while on prescription medicines. In the event of worsening symptoms, patient is instructed to call the crisis hotline, 911 and or go to the nearest ED for appropriate evaluation and treatment of symptoms. To follow-up with his/her primary care provider for your other medical issues, concerns and or health care needs.     Signed: Lowry Ram Chancie Lampert, FNP 03/05/2017, 10:22 AM

## 2017-03-08 ENCOUNTER — Telehealth: Payer: Self-pay

## 2017-03-08 NOTE — Telephone Encounter (Signed)
Pt wanted lab results from ED visit. Labs reviewed

## 2017-04-05 ENCOUNTER — Telehealth: Payer: Self-pay

## 2017-04-05 NOTE — Telephone Encounter (Signed)
Returned call and patient states that she was taking flagyl for bv and missed a few pills and is still having discharge and now thinks that she has a yeast infection too, pt wants rx sent, routed to provider.

## 2017-04-06 ENCOUNTER — Telehealth: Payer: Self-pay

## 2017-04-06 ENCOUNTER — Other Ambulatory Visit: Payer: Self-pay | Admitting: Obstetrics

## 2017-04-06 DIAGNOSIS — B3731 Acute candidiasis of vulva and vagina: Secondary | ICD-10-CM

## 2017-04-06 DIAGNOSIS — B373 Candidiasis of vulva and vagina: Secondary | ICD-10-CM

## 2017-04-06 MED ORDER — FLUCONAZOLE 150 MG PO TABS: 150.0000 mg | ORAL_TABLET | Freq: Once | ORAL | 0 refills | Status: AC

## 2017-04-06 MED ORDER — FLUCONAZOLE 150 MG PO TABS: 150.0000 mg | ORAL_TABLET | Freq: Once | ORAL | 0 refills | Status: DC

## 2017-04-06 NOTE — Telephone Encounter (Signed)
Called to advise of rx sent by provider, no answer, left vm

## 2017-04-09 ENCOUNTER — Other Ambulatory Visit: Payer: Self-pay

## 2017-04-09 DIAGNOSIS — N760 Acute vaginitis: Principal | ICD-10-CM

## 2017-04-09 DIAGNOSIS — B9689 Other specified bacterial agents as the cause of diseases classified elsewhere: Secondary | ICD-10-CM

## 2017-04-09 MED ORDER — METRONIDAZOLE 500 MG PO TABS: 500.0000 mg | ORAL_TABLET | Freq: Two times a day (BID) | ORAL | 0 refills | Status: AC

## 2017-04-09 NOTE — Progress Notes (Signed)
Pt states that she did not finish that treatment of flagyl because she was admitted to behavioral health. States that she does not think she was given this while she was there. She complains of continuing to have discharge with odor. [facility]

## 2017-12-25 ENCOUNTER — Telehealth: Payer: Self-pay

## 2017-12-25 MED ORDER — FLUCONAZOLE 150 MG PO TABS: 150.0000 mg | ORAL_TABLET | Freq: Once | ORAL | 0 refills | Status: AC

## 2017-12-25 NOTE — Telephone Encounter (Signed)
Patient called requesting rx for yeast. Complains of vaginal itching, burning and thick white discharge. Patient stated she would call back to schedule annual.

## 2017-12-27 ENCOUNTER — Ambulatory Visit (INDEPENDENT_AMBULATORY_CARE_PROVIDER_SITE_OTHER): Payer: BC Managed Care – PPO | Admitting: Certified Nurse Midwife

## 2017-12-27 ENCOUNTER — Encounter: Payer: Self-pay | Admitting: Certified Nurse Midwife

## 2017-12-27 VITALS — BP 147/86 | HR 98 | Ht 71.0 in | Wt 303.6 lb

## 2017-12-27 DIAGNOSIS — B3731 Acute candidiasis of vulva and vagina: Secondary | ICD-10-CM

## 2017-12-27 DIAGNOSIS — B9689 Other specified bacterial agents as the cause of diseases classified elsewhere: Secondary | ICD-10-CM

## 2017-12-27 DIAGNOSIS — B372 Candidiasis of skin and nail: Secondary | ICD-10-CM

## 2017-12-27 DIAGNOSIS — N760 Acute vaginitis: Secondary | ICD-10-CM

## 2017-12-27 DIAGNOSIS — B373 Candidiasis of vulva and vagina: Secondary | ICD-10-CM

## 2017-12-27 MED ORDER — NYSTATIN 100000 UNIT/GM EX POWD: Freq: Two times a day (BID) | CUTANEOUS | 2 refills | Status: DC

## 2017-12-27 MED ORDER — TINIDAZOLE 500 MG PO TABS: 2.0000 g | ORAL_TABLET | Freq: Every day | ORAL | 0 refills | Status: DC

## 2017-12-27 MED ORDER — FLUCONAZOLE 200 MG PO TABS: 200.0000 mg | ORAL_TABLET | Freq: Once | ORAL | 2 refills | Status: AC

## 2017-12-27 MED ORDER — TERCONAZOLE 0.8 % VA CREA: 1.0000 | TOPICAL_CREAM | Freq: Every day | VAGINAL | 0 refills | Status: DC

## 2017-12-27 NOTE — Progress Notes (Signed)
Patient ID: [name], female   DOB: March 23, 1976, 42 y.o.   MRN: [id number]  Chief Complaint  Patient presents with  . Vaginal Discharge    HPI [name] is a 42 y.o. female.  Reports frequent yeast infections with under breast tissue skin irritation and vaginal irritation.  Has not been compliant with DM2 diet.  Is followed for DM2 by her PCP.  10/11/14 was her last annual exam with Dr. Jodi [name].  Diet, exercise and probiotics discussed.  Reports doing a vinegar bath three nights ago to help with the yeast/itching.  Denies any new sexual partners.  Reports being at the beach two weeks ago and not doing well with her diet: drank beer and the yeast started around then.      HPI  Past Medical History:  Diagnosis Date  . Abnormal Pap smear   . Bipolar 1 disorder ([place])   . Fibroids   . IBS (irritable bowel syndrome)     Past Surgical History:  Procedure Laterality Date  . CERVICAL BIOPSY    . CHOLECYSTECTOMY    . TUBAL LIGATION      Family History  Problem Relation Age of Onset  . Diabetes Father   . Diabetes Paternal Grandmother   . Diabetes Maternal Grandmother   . Mental illness Cousin     Social History Social History   Tobacco Use  . Smoking status: Current Every Day Smoker    Packs/day: 0.25    Years: 0.00    Pack years: 0.00    Types: Cigarettes  . Smokeless tobacco: Never Used  Substance Use Topics  . Alcohol use: Yes    Comment: occasional  . Drug use: Yes    Comment: CBD oil    No Known Allergies  Current Outpatient Medications  Medication Sig Dispense Refill  . ARIPiprazole (ABILIFY) 10 MG tablet Take 1 tablet (10 mg total) by mouth at bedtime. For mood control 14 tablet 0  . ARIPiprazole ER 400 MG SRER Inject 400 mg into the muscle every 30 (thirty) days. For mood control next injection due 04/02/17 1 each 0  . benztropine (COGENTIN) 0.5 MG tablet Take 1 tablet (0.5 mg total) by mouth at bedtime. For side effects to medications 30 tablet 0  . buPROPion  (WELLBUTRIN) 100 MG tablet Take 1 tablet (100 mg total) by mouth daily with breakfast. For mood control 30 tablet 0  . divalproex (DEPAKOTE) 500 MG DR tablet Take 1 tablet (500 mg total) by mouth every 12 (twelve) hours. For mood control 60 tablet 0  . ferrous sulfate 325 (65 FE) MG tablet Take 1 tablet (325 mg total) by mouth daily with breakfast. For iron level 30 tablet 0  . fluconazole (DIFLUCAN) 200 MG tablet Take 1 tablet (200 mg total) by mouth once for 1 dose. Repeat dose in 48-72 hours. 3 tablet 2  . metFORMIN (GLUCOPHAGE) 500 MG tablet Take 1 tablet (500 mg total) by mouth 2 (two) times daily with a meal. For diabetes 60 tablet 0  . nystatin (MYCOSTATIN/NYSTOP) powder Apply topically 2 (two) times daily. 60 g 2  . terconazole (TERAZOL 3) 0.8 % vaginal cream Place 1 applicator vaginally at bedtime. 20 g 0  . tinidazole (TINDAMAX) 500 MG tablet Take 4 tablets (2,000 mg total) by mouth daily with breakfast. 12 tablet 0  . traZODone (DESYREL) 150 MG tablet Take 1 tablet (150 mg total) by mouth at bedtime. For mood control and sleep 30 tablet 0   No current facility-administered  medications for this visit.     Review of Systems Review of Systems Constitutional: negative for fatigue and weight loss Respiratory: negative for cough and wheezing Cardiovascular: negative for chest pain, fatigue and palpitations Gastrointestinal: negative for abdominal pain and change in bowel habits Genitourinary:+vaginal diacharge Integument/breast: negative for nipple discharge, + under breast skin irriation Musculoskeletal:negative for myalgias Neurological: negative for gait problems and tremors Behavioral/Psych: negative for abusive relationship, depression Endocrine: negative for temperature intolerance      Blood pressure (!) 147/86, pulse 98, height 5\' 11"  (1.803 m), weight (!) 303 lb 9.6 oz (137.7 kg), last menstrual period 12/14/2017.  Physical Exam Physical Exam General:   alert  Skin:   no  rash or abnormalities  Lungs:   clear to auscultation bilaterally  Heart:   regular rate and rhythm, S1, S2 normal, no murmur, click, rub or gallop  Breasts:   normal without suspicious masses, skin or nipple changes or axillary nodes  Abdomen:  normal findings: no organomegaly, soft, non-tender and no hernia  Pelvis:  External genitalia: normal general appearance Urinary system: urethral meatus normal and bladder without fullness, nontender Vaginal: normal without tenderness, induration or masses, + gray, thick white chunky malodorous discharge noted.  Cervix: deferred  Adnexa: deferred Uterus: deferred    50% of 15 min visit spent on counseling and coordination of care.   Data Reviewed Previous medical records, labs, meds  Assessment     1. Yeast infection of the vagina    - fluconazole (DIFLUCAN) 200 MG tablet; Take 1 tablet (200 mg total) by mouth once for 1 dose. Repeat dose in 48-72 hours.  Dispense: 3 tablet; Refill: 2 - terconazole (TERAZOL 3) 0.8 % vaginal cream; Place 1 applicator vaginally at bedtime.  Dispense: 20 g; Refill: 0  2. Yeast infection of the skin    - nystatin (MYCOSTATIN/NYSTOP) powder; Apply topically 2 (two) times daily.  Dispense: 60 g; Refill: 2  3. BV (bacterial vaginosis)    - tinidazole (TINDAMAX) 500 MG tablet; Take 4 tablets (2,000 mg total) by mouth daily with breakfast.  Dispense: 12 tablet; Refill: 0     Plan    Verbal Rx Called to [facility] for Boric Acid vaginal supp. 600 mg nightly for 7 nights.   Meds ordered this encounter  Medications  . tinidazole (TINDAMAX) 500 MG tablet    Sig: Take 4 tablets (2,000 mg total) by mouth daily with breakfast.    Dispense:  12 tablet    Refill:  0  . nystatin (MYCOSTATIN/NYSTOP) powder    Sig: Apply topically 2 (two) times daily.    Dispense:  60 g    Refill:  2  . fluconazole (DIFLUCAN) 200 MG tablet    Sig: Take 1 tablet (200 mg total) by mouth once for 1 dose. Repeat dose in 48-72  hours.    Dispense:  3 tablet    Refill:  2  . terconazole (TERAZOL 3) 0.8 % vaginal cream    Sig: Place 1 applicator vaginally at bedtime.    Dispense:  20 g    Refill:  0    Possible management options include:OTC probiotics discussed, diet modification Follow up with annual exam.

## 2017-12-27 NOTE — Progress Notes (Signed)
Gyn presents for yellowish, itchy discharge, burning and lower abdominal pain 9/10 x 2 weeks.  She denies fever, chills, NV and odor.

## 2017-12-28 NOTE — Addendum Note (Signed)
Addended by: Maryruth Eve on: 12/28/2017 12:06 PM   Modules accepted: Orders

## 2018-01-01 LAB — CERVICOVAGINAL ANCILLARY ONLY
Bacterial vaginitis: POSITIVE — AB
Candida vaginitis: NEGATIVE

## 2018-01-08 ENCOUNTER — Other Ambulatory Visit: Payer: Self-pay | Admitting: Certified Nurse Midwife

## 2018-02-04 ENCOUNTER — Telehealth: Payer: Self-pay

## 2018-02-04 DIAGNOSIS — B379 Candidiasis, unspecified: Secondary | ICD-10-CM

## 2018-02-04 MED ORDER — FLUCONAZOLE 150 MG PO TABS: 150.0000 mg | ORAL_TABLET | Freq: Once | ORAL | 0 refills | Status: AC

## 2018-02-04 NOTE — Telephone Encounter (Signed)
Pt called requesting rx for diflucan. She states that she is having vaginal itching, and thick white discharge. Rx sent to the pharmacy.

## 2018-05-17 ENCOUNTER — Encounter (HOSPITAL_COMMUNITY): Payer: Self-pay

## 2018-05-17 ENCOUNTER — Other Ambulatory Visit: Payer: Self-pay

## 2018-05-17 ENCOUNTER — Emergency Department (EMERGENCY_DEPARTMENT_HOSPITAL)
Admission: EM | Admit: 2018-05-17 | Discharge: 2018-05-18 | Disposition: A | Discharge status: Home / Self Care | Payer: BC Managed Care – PPO | Source: Home / Self Care | Attending: Emergency Medicine | Admitting: Emergency Medicine

## 2018-05-17 ENCOUNTER — Emergency Department (HOSPITAL_COMMUNITY): Payer: BC Managed Care – PPO

## 2018-05-17 DIAGNOSIS — R4182 Altered mental status, unspecified: Secondary | ICD-10-CM | POA: Insufficient documentation

## 2018-05-17 DIAGNOSIS — Z008 Encounter for other general examination: Secondary | ICD-10-CM | POA: Insufficient documentation

## 2018-05-17 DIAGNOSIS — Z79899 Other long term (current) drug therapy: Secondary | ICD-10-CM

## 2018-05-17 DIAGNOSIS — D259 Leiomyoma of uterus, unspecified: Secondary | ICD-10-CM | POA: Diagnosis present

## 2018-05-17 DIAGNOSIS — Z7984 Long term (current) use of oral hypoglycemic drugs: Secondary | ICD-10-CM

## 2018-05-17 DIAGNOSIS — E119 Type 2 diabetes mellitus without complications: Secondary | ICD-10-CM | POA: Insufficient documentation

## 2018-05-17 DIAGNOSIS — F3164 Bipolar disorder, current episode mixed, severe, with psychotic features: Secondary | ICD-10-CM

## 2018-05-17 DIAGNOSIS — F17210 Nicotine dependence, cigarettes, uncomplicated: Secondary | ICD-10-CM | POA: Diagnosis present

## 2018-05-17 DIAGNOSIS — F250 Schizoaffective disorder, bipolar type: Secondary | ICD-10-CM | POA: Diagnosis not present

## 2018-05-17 DIAGNOSIS — Z9851 Tubal ligation status: Secondary | ICD-10-CM

## 2018-05-17 DIAGNOSIS — Z9049 Acquired absence of other specified parts of digestive tract: Secondary | ICD-10-CM

## 2018-05-17 DIAGNOSIS — Z9114 Patient's other noncompliance with medication regimen: Secondary | ICD-10-CM

## 2018-05-17 DIAGNOSIS — Z833 Family history of diabetes mellitus: Secondary | ICD-10-CM

## 2018-05-17 DIAGNOSIS — K589 Irritable bowel syndrome without diarrhea: Secondary | ICD-10-CM | POA: Diagnosis present

## 2018-05-17 HISTORY — DX: Type 2 diabetes mellitus without complications: E11.9

## 2018-05-17 LAB — BLOOD GAS, VENOUS
Acid-base deficit: 2.5 mmol/L — ABNORMAL HIGH (ref 0.0–2.0)
Bicarbonate: 21.1 mmol/L (ref 20.0–28.0)
O2 Saturation: 56.8 %
Patient temperature: 98.6
pCO2, Ven: 34.1 mmHg — ABNORMAL LOW (ref 44.0–60.0)
pH, Ven: 7.408 (ref 7.250–7.430)
pO2, Ven: 33.4 mmHg (ref 32.0–45.0)

## 2018-05-17 LAB — URINALYSIS, ROUTINE W REFLEX MICROSCOPIC
Bilirubin Urine: NEGATIVE
Glucose, UA: >=500 mg/dL — AB
Hgb urine dipstick: NEGATIVE
Ketones, ur: 80 mg/dL — AB
Leukocytes, UA: NEGATIVE
Nitrite: NEGATIVE
Protein, ur: 30 mg/dL — AB
Specific Gravity, Urine: 1.021 (ref 1.005–1.030)
pH: 6 (ref 5.0–8.0)

## 2018-05-17 LAB — COMPREHENSIVE METABOLIC PANEL
ALT: 41 U/L (ref 0–44)
AST: 29 U/L (ref 15–41)
Albumin: 3.6 g/dL (ref 3.5–5.0)
Alkaline Phosphatase: 76 U/L (ref 38–126)
Anion gap: 14 (ref 5–15)
BUN: 9 mg/dL (ref 6–20)
CO2: 19 mmol/L — ABNORMAL LOW (ref 22–32)
Calcium: 8.7 mg/dL — ABNORMAL LOW (ref 8.9–10.3)
Chloride: 107 mmol/L (ref 98–111)
Creatinine, Ser: 0.76 mg/dL (ref 0.44–1.00)
GFR calc Af Amer: >60 mL/min (ref >60)
GFR calc non Af Amer: >60 mL/min (ref >60)
Glucose, Bld: 300 mg/dL — ABNORMAL HIGH (ref 70–99)
Potassium: 3 mmol/L — ABNORMAL LOW (ref 3.5–5.1)
Sodium: 140 mmol/L (ref 135–145)
Total Bilirubin: 1.5 mg/dL — ABNORMAL HIGH (ref 0.3–1.2)
Total Protein: 6.7 g/dL (ref 6.5–8.1)

## 2018-05-17 LAB — CBC WITH DIFFERENTIAL/PLATELET
Abs Immature Granulocytes: 0.03 10*3/uL (ref 0.00–0.07)
Basophils Absolute: 0.1 10*3/uL (ref 0.0–0.1)
Basophils Relative: 1 %
Eosinophils Absolute: 0.1 10*3/uL (ref 0.0–0.5)
Eosinophils Relative: 1 %
HCT: 36.1 % (ref 36.0–46.0)
Hemoglobin: 11.1 g/dL — ABNORMAL LOW (ref 12.0–15.0)
Immature Granulocytes: 0 %
Lymphocytes Relative: 13 %
Lymphs Abs: 1.3 10*3/uL (ref 0.7–4.0)
MCH: 24.9 pg — ABNORMAL LOW (ref 26.0–34.0)
MCHC: 30.7 g/dL (ref 30.0–36.0)
MCV: 80.9 fL (ref 80.0–100.0)
Monocytes Absolute: 0.5 10*3/uL (ref 0.1–1.0)
Monocytes Relative: 5 %
Neutro Abs: 8 10*3/uL — ABNORMAL HIGH (ref 1.7–7.7)
Neutrophils Relative %: 80 %
Platelets: 266 10*3/uL (ref 150–400)
RBC: 4.46 MIL/uL (ref 3.87–5.11)
RDW: 16.6 % — ABNORMAL HIGH (ref 11.5–15.5)
WBC: 9.9 10*3/uL (ref 4.0–10.5)
nRBC: 0 % (ref 0.0–0.2)

## 2018-05-17 LAB — PREGNANCY, URINE: Preg Test, Ur: NEGATIVE

## 2018-05-17 LAB — RAPID URINE DRUG SCREEN, HOSP PERFORMED
Amphetamines: NOT DETECTED
Barbiturates: NOT DETECTED
Benzodiazepines: NOT DETECTED
Cocaine: NOT DETECTED
Opiates: NOT DETECTED
Tetrahydrocannabinol: POSITIVE — AB

## 2018-05-17 LAB — TSH: TSH: 0.744 u[IU]/mL (ref 0.350–4.500)

## 2018-05-17 LAB — I-STAT CG4 LACTIC ACID, ED
Lactic Acid, Venous: 1.18 mmol/L (ref 0.5–1.9)
Lactic Acid, Venous: 1.06 mmol/L (ref 0.5–1.9)

## 2018-05-17 LAB — SALICYLATE LEVEL: Salicylate Lvl: <7 mg/dL (ref 2.8–30.0)

## 2018-05-17 LAB — ACETAMINOPHEN LEVEL: Acetaminophen (Tylenol), Serum: <10 ug/mL — ABNORMAL LOW (ref 10–30)

## 2018-05-17 LAB — MAGNESIUM: Magnesium: 1.7 mg/dL (ref 1.7–2.4)

## 2018-05-17 LAB — CBG MONITORING, ED
Glucose-Capillary: 318 mg/dL — ABNORMAL HIGH (ref 70–99)
Glucose-Capillary: 226 mg/dL — ABNORMAL HIGH (ref 70–99)
Glucose-Capillary: 180 mg/dL — ABNORMAL HIGH (ref 70–99)

## 2018-05-17 MED ORDER — LORAZEPAM 1 MG PO TABS: 1.0000 mg | ORAL_TABLET | Freq: Four times a day (QID) | ORAL | Status: DC

## 2018-05-17 MED ORDER — INSULIN ASPART 100 UNIT/ML ~~LOC~~ SOLN
8.0000 [IU] | Freq: Once | SUBCUTANEOUS | Status: AC
  Administered 2018-05-17: 8 [IU] via INTRAVENOUS
  Filled 2018-05-17: qty 1

## 2018-05-17 MED ORDER — DIVALPROEX SODIUM 500 MG PO DR TAB
500.0000 mg | DELAYED_RELEASE_TABLET | Freq: Two times a day (BID) | ORAL | Status: DC
  Administered 2018-05-17 – 2018-05-18 (×2): 500 mg via ORAL
  Filled 2018-05-17 (×2): qty 1

## 2018-05-17 MED ORDER — METFORMIN HCL 500 MG PO TABS
500.0000 mg | ORAL_TABLET | Freq: Two times a day (BID) | ORAL | Status: DC
  Administered 2018-05-17 – 2018-05-18 (×2): 500 mg via ORAL
  Filled 2018-05-17 (×2): qty 1

## 2018-05-17 MED ORDER — BUPROPION HCL 100 MG PO TABS
100.0000 mg | ORAL_TABLET | Freq: Every day | ORAL | Status: DC
  Administered 2018-05-17 – 2018-05-18 (×2): 100 mg via ORAL
  Filled 2018-05-17 (×2): qty 1

## 2018-05-17 MED ORDER — POTASSIUM CHLORIDE 10 MEQ/100ML IV SOLN
10.0000 meq | INTRAVENOUS | Status: AC
  Administered 2018-05-17 (×3): 10 meq via INTRAVENOUS
  Filled 2018-05-17 (×3): qty 100

## 2018-05-17 MED ORDER — LORAZEPAM 2 MG/ML IJ SOLN
1.0000 mg | Freq: Four times a day (QID) | INTRAMUSCULAR | Status: DC
  Administered 2018-05-17: 1 mg via INTRAMUSCULAR
  Filled 2018-05-17: qty 1

## 2018-05-17 MED ORDER — ARIPIPRAZOLE 10 MG PO TABS
10.0000 mg | ORAL_TABLET | Freq: Every day | ORAL | Status: DC
  Administered 2018-05-17: 10 mg via ORAL
  Filled 2018-05-17: qty 1

## 2018-05-17 MED ORDER — TRAZODONE HCL 100 MG PO TABS
100.0000 mg | ORAL_TABLET | Freq: Every day | ORAL | Status: DC
  Administered 2018-05-17: 100 mg via ORAL
  Filled 2018-05-17: qty 1

## 2018-05-17 NOTE — ED Notes (Signed)
Bed: TK24 Expected date:  Expected time:  Means of arrival:  Comments: EMS 42yo AMS

## 2018-05-17 NOTE — ED Notes (Signed)
Bed: WA27 Expected date:  Expected time:  Means of arrival:  Comments: 

## 2018-05-17 NOTE — ED Notes (Signed)
Patient transported to CT 

## 2018-05-17 NOTE — ED Triage Notes (Signed)
Her young daughter phoned EMS d/t pt. "real sleepy". She arrives very drowsy in appearance and in no distress. She does react with resistive motions with all extremities when assessed/undressed. Tachypnea with mild htn, otherwise v.s. WDL. CBG here 318.

## 2018-05-17 NOTE — BH Assessment (Signed)
Assessment Note  [name] is an 42 y.o. female that presents this date voluntary from home. Patient's daughter contacted EMS after patient started acting bizarre within the last 24 hours. Patient renders limited information due to current altered mental state. Patient is speaking incoherenty using word salad as this writer attempts to assess. This writer attempts to redirect unsuccessfully. Patient is unable to participate in the assessment. Information to complete assessment was obtained from admission notes and prior history. Per notes,"Patient's daughter called EMS for altered mental state earlier today. Patient seems catatonic on admission. Patient is just staring and will not respond to any questions. Patient not following commands. Unclear when symptoms began. Per history review patient was last seen at [facility] in 2018 presenting at that time with similar symptoms. Per that note, patient has a history of bipolar disorder with psychotic symptoms, altered mental status and thought blocking. Patient had been receiving services per history from [facility] who assist with medication management although patient has not received services from that provider since 2018. Case was staffed with Romilda Garret FNP who recommended a inpatient admission to assist with stabilization. .      Diagnosis: F31.5 Bipolar 1 disorder, Current or most recent episode, With psychotic features  Past Medical History:  Past Medical History:  Diagnosis Date  . Abnormal Pap smear   . Bipolar 1 disorder ([place])   . Diabetes mellitus without complication ([place])   . Fibroids   . IBS (irritable bowel syndrome)     Past Surgical History:  Procedure Laterality Date  . CERVICAL BIOPSY    . CHOLECYSTECTOMY    . TUBAL LIGATION      Family History:  Family History  Problem Relation Age of Onset  . Diabetes Father   . Diabetes Paternal Grandmother   . Diabetes Maternal Grandmother   . Mental illness Cousin     Social History:  reports  that she has been smoking cigarettes. She has been smoking about 0.25 packs per day for the past 0.00 years. She has never used smokeless tobacco. She reports that she drinks alcohol. She reports that she has current or past drug history.  Additional Social History:  Alcohol / Drug Use Pain Medications: See MAR Prescriptions: See MAR Over the Counter: See MAR History of alcohol / drug use?: Yes Longest period of sobriety (when/how long): Unknown Negative Consequences of Use: (UTA) Withdrawal Symptoms: (UTA) Substance #1 Name of Substance 1: Alcohol 1 - Age of First Use: 27 1 - Amount (size/oz): Per hx UTA this date 1 - Frequency: Per hx  1 - Duration: Per hx 1 - Last Use / Amount: Per hx BAL pending  CIWA: CIWA-Ar BP: 135/72 Pulse Rate: 75 COWS:    Allergies: No Known Allergies  Home Medications:  (Not in a hospital admission)  OB/GYN Status:  No LMP recorded.  General Assessment Data Assessment unable to be completed: Yes Reason for not completing assessment: Pt in CT Location of Assessment: [facility] TTS Assessment: In system Is this a Tele or Face-to-Face Assessment?: Face-to-Face Is this an Initial Assessment or a Re-assessment for this encounter?: Initial Assessment Patient Accompanied by:: (NA) Language Other than English: No Living Arrangements: (Child) What gender do you identify as?: Female Marital status: Single Maiden name: Moumouni Pregnancy Status: No Living Arrangements: Children Can pt return to current living arrangement?: Yes Admission Status: Voluntary Is patient capable of signing voluntary admission?: Yes Referral Source: Self/Family/Friend Insurance type: Insurance risk surveyor Exam ([place]) Medical Exam  completed: Yes  Crisis Care Plan Living Arrangements: Children Legal Guardian: (NA) Name of Psychiatrist: [facility](Per hx) Name of Therapist: [facility]  Education Status Is patient currently in school?: No Is the patient employed,  unemployed or receiving disability?: Unemployed  Risk to self with the past 6 months Suicidal Ideation: No Has patient been a risk to self within the past 6 months prior to admission? : No Suicidal Intent: No Has patient had any suicidal intent within the past 6 months prior to admission? : No Is patient at risk for suicide?: No Suicidal Plan?: No Has patient had any suicidal plan within the past 6 months prior to admission? : No Access to Means: No What has been your use of drugs/alcohol within the last 12 months?: NA Previous Attempts/Gestures: No How many times?: 0 Other Self Harm Risks: (NA) Triggers for Past Attempts: Unknown Intentional Self Injurious Behavior: None Family Suicide History: No Recent stressful life event(s): (UNK) Persecutory voices/beliefs?: Pincus Badder) Depression: (UTA) Depression Symptoms: (UTA) Substance abuse history and/or treatment for substance abuse?: (UTA) Suicide prevention information given to non-admitted patients: Not applicable  Risk to Others within the past 6 months Homicidal Ideation: No Does patient have any lifetime risk of violence toward others beyond the six months prior to admission? : No Thoughts of Harm to Others: No Current Homicidal Intent: No Current Homicidal Plan: No Access to Homicidal Means: No Identified Victim: NA History of harm to others?: No Assessment of Violence: None Noted Violent Behavior Description: NA Does patient have access to weapons?: No Criminal Charges Pending?: No Does patient have a court date: No Is patient on probation?: No  Psychosis Hallucinations: None noted Delusions: None noted  Mental Status Report Appearance/Hygiene: In scrubs Eye Contact: Poor Motor Activity: Freedom of movement Speech: Word salad Level of Consciousness: Drowsy Mood: Anxious Affect: Constricted Anxiety Level: Moderate Thought Processes: Unable to Assess Judgement: Unable to Assess Orientation: Unable to  assess Obsessive Compulsive Thoughts/Behaviors: Unable to Assess  Cognitive Functioning Concentration: Unable to Assess Memory: Unable to Assess Is patient IDD: No Insight: Unable to Assess Impulse Control: Unable to Assess Appetite: (UTA) Have you had any weight changes? : (UTA) Sleep: (UTA) Total Hours of Sleep: (UTA) Vegetative Symptoms: None  ADLScreening [facility] Assessment Services) Patient's cognitive ability adequate to safely complete daily activities?: Yes Patient able to express need for assistance with ADLs?: Yes Independently performs ADLs?: Yes (appropriate for developmental age)  Prior Inpatient Therapy Prior Inpatient Therapy: Yes Prior Therapy Dates: 2018 Prior Therapy Facilty/Provider(s): [facility] Reason for Treatment: MH issues  Prior Outpatient Therapy Prior Outpatient Therapy: Yes Prior Therapy Dates: 2018(Per notes) Prior Therapy Facilty/Provider(s): [facility] Reason for Treatment: Med mang Does patient have an ACCT team?: No Does patient have Intensive In-House Services?  : No Does patient have [facility] services? : Yes Does patient have P4CC services?: No  ADL Screening (condition at time of admission) Patient's cognitive ability adequate to safely complete daily activities?: Yes Is the patient deaf or have difficulty hearing?: No Does the patient have difficulty seeing, even when wearing glasses/contacts?: No Does the patient have difficulty concentrating, remembering, or making decisions?: Yes Patient able to express need for assistance with ADLs?: Yes Does the patient have difficulty dressing or bathing?: No Independently performs ADLs?: Yes (appropriate for developmental age) Does the patient have difficulty walking or climbing stairs?: No Weakness of Legs: None Weakness of Arms/Hands: None  Home Assistive Devices/Equipment Home Assistive Devices/Equipment: None  Therapy Consults (therapy consults require a physician order) PT  Evaluation Needed: No OT Evalulation Needed: No SLP Evaluation Needed: No Abuse/Neglect Assessment (Assessment to be complete while patient is alone) Physical Abuse: Denies Verbal Abuse: Denies Sexual Abuse: Denies Exploitation of patient/patient's resources: Denies Self-Neglect: Denies Values / Beliefs Cultural Requests During Hospitalization: None Spiritual Requests During Hospitalization: None Consults Spiritual Care Consult Needed: No Social Work Consult Needed: No Regulatory affairs officer (For Healthcare) Does Patient Have a Medical Advance Directive?: No Would patient like information on creating a medical advance directive?: No - Patient declined          Disposition:  Case was staffed with Romilda Garret FNP who recommended a inpatient admission to assist with stabilization. .      Disposition Initial Assessment Completed for this Encounter: Yes Disposition of Patient: Admit Type of inpatient treatment program: Adult Patient refused recommended treatment: No Mode of transportation if patient is discharged?: Tomasita Crumble)  On Site Evaluation by:   Reviewed with Physician:    Mamie Nick 05/17/2018 4:02 PM

## 2018-05-17 NOTE — BH Assessment (Signed)
[facility] Assessment Progress Note  Case was staffed with Parks FNP who recommended a inpatient admission to assist with stabilization.     

## 2018-05-17 NOTE — ED Notes (Signed)
Phlebotomy called for lab draw for pt. IV would not pull back

## 2018-05-17 NOTE — ED Provider Notes (Signed)
[facility] DEPT Provider Note   CSN: [id number] Arrival date & time: 05/17/18  0903     History   Chief Complaint Chief Complaint  Patient presents with  . Altered Mental Status    HPI [name] is a 42 y.o. female.  HPI   30yF with decreased responsiveness. Apparently her daughter called EMS for this reason earlier today. Pt seems catatonic. She will look at me when I talk to her but just stares. Not answering questions or following commands. Unclear when symptoms began. No reported trauma or ingestion.   Past Medical History:  Diagnosis Date  . Abnormal Pap smear   . Bipolar 1 disorder ([place])   . Diabetes mellitus without complication ([place])   . Fibroids   . IBS (irritable bowel syndrome)     Patient Active Problem List   Diagnosis Date Noted  . Diabetes mellitus ([place]) 08/10/2016  . Bipolar disorder, curr episode mixed, severe, with psychotic features ([place]) 08/07/2016  . Cannabis use disorder, moderate, dependence ([place]) 08/07/2016  . CHOLECYSTITIS, UNSPECIFIED 06/12/2008  . BILIARY COLIC 51/88/4166  . CERUMEN IMPACTION, BILATERAL 01/17/2008  . PHARYNGITIS, VIRAL 01/17/2008  . NECK PAIN 01/17/2008    Past Surgical History:  Procedure Laterality Date  . CERVICAL BIOPSY    . CHOLECYSTECTOMY    . TUBAL LIGATION       OB History    Gravida  7   Para  2   Term  2   Preterm      AB  5   Living  2     SAB      TAB  4   Ectopic  1   Multiple      Live Births               Home Medications    Prior to Admission medications   Medication Sig Start Date End Date Taking? Authorizing Provider  ARIPiprazole (ABILIFY) 10 MG tablet Take 1 tablet (10 mg total) by mouth at bedtime. For mood control 03/05/17   Money, Lowry Ram, FNP  ARIPiprazole ER 400 MG SRER Inject 400 mg into the muscle every 30 (thirty) days. For mood control next injection due 04/02/17 03/05/17   Money, Lowry Ram, FNP  benztropine (COGENTIN) 0.5 MG  tablet Take 1 tablet (0.5 mg total) by mouth at bedtime. For side effects to medications 03/05/17   Money, Lowry Ram, FNP  buPROPion [facility]) 100 MG tablet Take 1 tablet (100 mg total) by mouth daily with breakfast. For mood control 03/06/17   Money, Lowry Ram, FNP  divalproex (DEPAKOTE) 500 MG DR tablet Take 1 tablet (500 mg total) by mouth every 12 (twelve) hours. For mood control 03/05/17   Money, Lowry Ram, FNP  ferrous sulfate 325 (65 FE) MG tablet Take 1 tablet (325 mg total) by mouth daily with breakfast. For iron level 03/06/17   Money, Lowry Ram, FNP  metFORMIN (GLUCOPHAGE) 500 MG tablet Take 1 tablet (500 mg total) by mouth 2 (two) times daily with a meal. For diabetes 03/05/17   Money, Lowry Ram, FNP  nystatin (MYCOSTATIN/NYSTOP) powder Apply topically 2 (two) times daily. 12/27/17   Kandis Cocking A, CNM  terconazole (TERAZOL 3) 0.8 % vaginal cream Place 1 applicator vaginally at bedtime. 12/27/17   Morene Crocker, CNM  tinidazole (TINDAMAX) 500 MG tablet Take 4 tablets (2,000 mg total) by mouth daily with breakfast. 12/27/17   Kandis Cocking A, CNM  traZODone (DESYREL) 150 MG tablet Take  1 tablet (150 mg total) by mouth at bedtime. For mood control and sleep 03/05/17   Money, Lowry Ram, FNP    Family History Family History  Problem Relation Age of Onset  . Diabetes Father   . Diabetes Paternal Grandmother   . Diabetes Maternal Grandmother   . Mental illness Cousin     Social History Social History   Tobacco Use  . Smoking status: Current Every Day Smoker    Packs/day: 0.25    Years: 0.00    Pack years: 0.00    Types: Cigarettes  . Smokeless tobacco: Never Used  Substance Use Topics  . Alcohol use: Yes    Comment: occasional  . Drug use: Yes    Comment: CBD oil     Allergies   Patient has no known allergies.   Review of Systems Review of Systems  Level 5 caveat because pt is nonverbal.   Physical Exam Updated Vital Signs BP (!) 162/79 (BP Location: Right Arm)    Pulse 84   Temp 98.7 F (37.1 C) (Rectal)   Resp (!) 30   SpO2 99%   Physical Exam  Constitutional: She appears well-developed and well-nourished. No distress.  Laying in bed. Appears somewhat tired but not distressed.   HENT:  Head: Normocephalic and atraumatic.  Eyes: Conjunctivae are normal. Right eye exhibits no discharge. Left eye exhibits no discharge.  Pupils 75mm, symmetric, reactive  Neck: Neck supple.  Cardiovascular: Normal rate, regular rhythm and normal heart sounds. Exam reveals no gallop and no friction rub.  No murmur heard. Pulmonary/Chest: Effort normal and breath sounds normal. No respiratory distress.  Abdominal: Soft. She exhibits no distension. There is no tenderness.  Musculoskeletal: She exhibits no edema or tenderness.  Skin: Skin is warm and dry.  Will make eye contact when spoken to. Not responding to my questions or following commands. Does move all extremities. Muscle tone seems normal. Normal reflexes.   Nursing note and vitals reviewed.    ED Treatments / Results  Labs (all labs ordered are listed, but only abnormal results are displayed) Labs Reviewed  CBC WITH DIFFERENTIAL/PLATELET - Abnormal; Notable for the following components:      Result Value   Hemoglobin 11.1 (*)    MCH 24.9 (*)    RDW 16.6 (*)    Neutro Abs 8.0 (*)    All other components within normal limits  COMPREHENSIVE METABOLIC PANEL - Abnormal; Notable for the following components:   Potassium 3.0 (*)    CO2 19 (*)    Glucose, Bld 300 (*)    Calcium 8.7 (*)    Total Bilirubin 1.5 (*)    All other components within normal limits  RAPID URINE DRUG SCREEN, HOSP PERFORMED - Abnormal; Notable for the following components:   Tetrahydrocannabinol POSITIVE (*)    All other components within normal limits  URINALYSIS, ROUTINE W REFLEX MICROSCOPIC - Abnormal; Notable for the following components:   Glucose, UA >=500 (*)    Ketones, ur 80 (*)    Protein, ur 30 (*)    Bacteria, UA  RARE (*)    All other components within normal limits  ACETAMINOPHEN LEVEL - Abnormal; Notable for the following components:   Acetaminophen (Tylenol), Serum <10 (*)    All other components within normal limits  BLOOD GAS, VENOUS - Abnormal; Notable for the following components:   pCO2, Ven 34.1 (*)    Acid-base deficit 2.5 (*)    All other components within normal limits  CBG MONITORING, ED - Abnormal; Notable for the following components:   Glucose-Capillary 318 (*)    All other components within normal limits  CBG MONITORING, ED - Abnormal; Notable for the following components:   Glucose-Capillary 226 (*)    All other components within normal limits  MAGNESIUM  SALICYLATE LEVEL  PREGNANCY, URINE  I-STAT CG4 LACTIC ACID, ED  I-STAT CG4 LACTIC ACID, ED  CBG MONITORING, ED    EKG EKG Interpretation  Date/Time:  Friday May 17 2018 09:18:42 EST Ventricular Rate:  75 PR Interval:    QRS Duration: 93 QT Interval:  390 QTC Calculation: 436 R Axis:   16 Text Interpretation:  Sinus rhythm Confirmed by Virgel Manifold [phone]) on 05/17/2018 10:53:32 AM   Radiology Ct Head Wo Contrast  Result Date: 05/17/2018 CLINICAL DATA:  Contrast-none Her young daughter phoned EMS d/t pt. "real sleepy". She arrives very drowsy in appearance and in no distress. She does react with resistive motions with all extremities when assessed/undressed. Tachypnea with mild hypertension. EXAM: CT HEAD WITHOUT CONTRAST TECHNIQUE: Contiguous axial images were obtained from the base of the skull through the vertex without intravenous contrast. COMPARISON:  02/23/2008 FINDINGS: Brain: No evidence of acute infarction, hemorrhage, hydrocephalus, extra-axial collection or mass lesion/mass effect. Vascular: No hyperdense vessel or unexpected calcification. Skull: Normal. Negative for fracture or focal lesion. Sinuses/Orbits: No acute finding. Other: There is cerumen within the bilateral external auditory canals.  IMPRESSION: No evidence for acute intracranial abnormality. Electronically Signed   By: Nolon Nations M.D.   On: 05/17/2018 11:20    Procedures Procedures (including critical care time)  Medications Ordered in ED Medications - No data to display   Initial Impression / Assessment and Plan / ED Course  I have reviewed the triage vital signs and the nursing notes.  Pertinent labs & imaging results that were available during my care of the patient were reviewed by me and considered in my medical decision making (see chart for details).     42yF with odd behavior. Hyperglycemic with mild decrease in bicarb. Ketones on UA. No increased anion gap though. VBG fine. Given IVF and some insulin with improvement. K supplemented. Pt subsequently speaking but nonsensical. Talking about her son and a bottle? She is afebrile. HD stable. Head CT fine. She has a psychiatric history and I suspect this is the underlying issue. TTs evaluation.    Final Clinical Impressions(s) / ED Diagnoses   Final diagnoses:  Altered mental status, unspecified altered mental status type    ED Discharge Orders    None       Virgel Manifold, MD 05/17/18 [phone]

## 2018-05-18 ENCOUNTER — Other Ambulatory Visit: Payer: Self-pay

## 2018-05-18 ENCOUNTER — Inpatient Hospital Stay (HOSPITAL_COMMUNITY)
Admission: AD | Admit: 2018-05-18 | Discharge: 2018-05-21 | DRG: 885 | Disposition: A | Discharge status: Home / Self Care | Payer: BC Managed Care – PPO | Attending: Psychiatry | Admitting: Psychiatry

## 2018-05-18 ENCOUNTER — Encounter (HOSPITAL_COMMUNITY): Payer: Self-pay

## 2018-05-18 DIAGNOSIS — F3164 Bipolar disorder, current episode mixed, severe, with psychotic features: Secondary | ICD-10-CM

## 2018-05-18 DIAGNOSIS — F250 Schizoaffective disorder, bipolar type: Secondary | ICD-10-CM | POA: Diagnosis present

## 2018-05-18 DIAGNOSIS — F17210 Nicotine dependence, cigarettes, uncomplicated: Secondary | ICD-10-CM | POA: Diagnosis present

## 2018-05-18 DIAGNOSIS — D259 Leiomyoma of uterus, unspecified: Secondary | ICD-10-CM | POA: Diagnosis present

## 2018-05-18 DIAGNOSIS — K589 Irritable bowel syndrome without diarrhea: Secondary | ICD-10-CM | POA: Diagnosis present

## 2018-05-18 DIAGNOSIS — Z9114 Patient's other noncompliance with medication regimen: Secondary | ICD-10-CM | POA: Diagnosis not present

## 2018-05-18 DIAGNOSIS — Z79899 Other long term (current) drug therapy: Secondary | ICD-10-CM | POA: Diagnosis not present

## 2018-05-18 DIAGNOSIS — F319 Bipolar disorder, unspecified: Secondary | ICD-10-CM | POA: Diagnosis present

## 2018-05-18 DIAGNOSIS — Z7984 Long term (current) use of oral hypoglycemic drugs: Secondary | ICD-10-CM | POA: Diagnosis not present

## 2018-05-18 DIAGNOSIS — Z833 Family history of diabetes mellitus: Secondary | ICD-10-CM | POA: Diagnosis not present

## 2018-05-18 DIAGNOSIS — E119 Type 2 diabetes mellitus without complications: Secondary | ICD-10-CM | POA: Diagnosis present

## 2018-05-18 DIAGNOSIS — Z9049 Acquired absence of other specified parts of digestive tract: Secondary | ICD-10-CM | POA: Diagnosis not present

## 2018-05-18 DIAGNOSIS — Z9851 Tubal ligation status: Secondary | ICD-10-CM | POA: Diagnosis not present

## 2018-05-18 LAB — PROLACTIN: Prolactin: 11.7 ng/mL (ref 4.8–23.3)

## 2018-05-18 LAB — CBG MONITORING, ED
Glucose-Capillary: 203 mg/dL — ABNORMAL HIGH (ref 70–99)
Glucose-Capillary: 232 mg/dL — ABNORMAL HIGH (ref 70–99)

## 2018-05-18 MED ORDER — TRAZODONE HCL 100 MG PO TABS
100.0000 mg | ORAL_TABLET | Freq: Every day | ORAL | Status: DC
  Administered 2018-05-18 – 2018-05-20 (×3): 100 mg via ORAL
  Filled 2018-05-18 (×7): qty 1

## 2018-05-18 MED ORDER — LORAZEPAM 1 MG PO TABS: 1.0000 mg | ORAL_TABLET | Freq: Four times a day (QID) | ORAL | Status: DC | PRN

## 2018-05-18 MED ORDER — LORAZEPAM 2 MG/ML IJ SOLN: 1.0000 mg | Freq: Four times a day (QID) | INTRAMUSCULAR | Status: DC | PRN

## 2018-05-18 MED ORDER — ALUM & MAG HYDROXIDE-SIMETH 200-200-20 MG/5ML PO SUSP: 30.0000 mL | ORAL | Status: DC | PRN

## 2018-05-18 MED ORDER — BUPROPION HCL 100 MG PO TABS
100.0000 mg | ORAL_TABLET | Freq: Every day | ORAL | Status: DC
  Administered 2018-05-19 – 2018-05-21 (×3): 100 mg via ORAL
  Filled 2018-05-18 (×8): qty 1

## 2018-05-18 MED ORDER — ARIPIPRAZOLE 10 MG PO TABS
10.0000 mg | ORAL_TABLET | Freq: Every day | ORAL | Status: DC
  Administered 2018-05-18 – 2018-05-20 (×3): 10 mg via ORAL
  Filled 2018-05-18 (×7): qty 1

## 2018-05-18 MED ORDER — HYDROXYZINE HCL 25 MG PO TABS
25.0000 mg | ORAL_TABLET | Freq: Three times a day (TID) | ORAL | Status: DC
  Administered 2018-05-18: 25 mg via ORAL
  Filled 2018-05-18: qty 1

## 2018-05-18 MED ORDER — DIVALPROEX SODIUM 500 MG PO DR TAB
DELAYED_RELEASE_TABLET | ORAL | Status: AC
  Filled 2018-05-18: qty 1

## 2018-05-18 MED ORDER — DIVALPROEX SODIUM 500 MG PO DR TAB
500.0000 mg | DELAYED_RELEASE_TABLET | Freq: Two times a day (BID) | ORAL | Status: DC
  Administered 2018-05-18 – 2018-05-21 (×6): 500 mg via ORAL
  Filled 2018-05-18 (×12): qty 1

## 2018-05-18 MED ORDER — MAGNESIUM HYDROXIDE 400 MG/5ML PO SUSP: 30.0000 mL | Freq: Every day | ORAL | Status: DC | PRN

## 2018-05-18 MED ORDER — ACETAMINOPHEN 325 MG PO TABS: 650.0000 mg | ORAL_TABLET | Freq: Four times a day (QID) | ORAL | Status: DC | PRN

## 2018-05-18 MED ORDER — METFORMIN HCL 500 MG PO TABS
500.0000 mg | ORAL_TABLET | Freq: Two times a day (BID) | ORAL | Status: DC
  Administered 2018-05-19 – 2018-05-21 (×5): 500 mg via ORAL
  Filled 2018-05-18 (×13): qty 1

## 2018-05-18 NOTE — Tx Team (Signed)
Initial Treatment Plan 05/18/2018 5:48 PM [name] OVZ:[id number]    PATIENT STRESSORS: Financial difficulties Legal issue Occupational concerns   PATIENT STRENGTHS: Average or above average intelligence Communication skills Motivation for treatment/growth   PATIENT IDENTIFIED PROBLEMS: "Stop worrying and confuse"  "TO be less stressful"  Anxiety  Depression               DISCHARGE CRITERIA:  Adequate post-discharge living arrangements Motivation to continue treatment in a less acute level of care  PRELIMINARY DISCHARGE PLAN: Outpatient therapy Return to previous living arrangement  PATIENT/FAMILY INVOLVEMENT: This treatment plan has been presented to and reviewed with the patient, [name], and/or family member.  The patient and family have been given the opportunity to ask questions and make suggestions.  Vela Prose, RN 05/18/2018, 5:48 PM

## 2018-05-18 NOTE — ED Notes (Signed)
Bed: [facility]40 Expected date:  Expected time:  Means of arrival:  Comments: 

## 2018-05-18 NOTE — Progress Notes (Signed)
Writer spoke with patient 1:1 and informed her of her medications scheduled. She was compliant with her meds and appears preoccupied. She shows signs of thought blocking but is pleasant when she does respond. Support given and safety maintained on unit with 15 min checks.

## 2018-05-18 NOTE — BH Assessment (Signed)
Admission Note: Patient is a 42 year old female admitted to the unit for symptoms of anxiety, depression and auditory hallucination.  Patient presents with flat affect and depressed mood.  Appears very sad/depressed with thought blocking noted during assessment.  Admission plan of care reviewed and consent signed.  Skin assessment and personal belongings completed.  Skin is dry and intact.  No contraband found.  States she is here to get help for her confusion, anxiety and depression.  Patient is oriented to the unit, staff and room.  Routine safety checks initiated every 15 minutes.  Patient is safe on the unit.

## 2018-05-18 NOTE — Progress Notes (Addendum)
Patient has been offered a bed at [facility].   Bed: 505-2   RN Call for [place] Social Worker [phone]

## 2018-05-18 NOTE — Progress Notes (Addendum)
Pt A & O to self, place and events leading to admission. Pt presents with a flat affect, depressed mood and tearful on assessment. Appears to be thought blocking with delayed response and mild confusion as well.  Denies SI, HI, VH and pain at this time. Endorsed +AH "the voices are telling me that they are going to cut my son up and kill him; I'm always looking out the window when my kids get off the bus because I don't want them to get hurt". Reports medication noncompliance since April 2019 "I took it on and off". Emotional support and encouragement offered to pt. Q 15 minutes safety checks maintained without self harm gestures or outburst to note at this time.

## 2018-05-18 NOTE — Consult Note (Addendum)
[facility] Face-to-Face Psychiatry Consult   Reason for Consult:  Psychosis  Referring Physician:  EDP Patient Identification: [name] MRN:  [id number] Principal Diagnosis: Bipolar disorder, curr episode mixed, severe, with psychotic features [facility]) Diagnosis:   Patient Active Problem List   Diagnosis Date Noted  . Bipolar disorder, curr episode mixed, severe, with psychotic features ([place]) [F31.64] 08/07/2016    Priority: High  . Diabetes mellitus ([place]) [E11.9] 08/10/2016  . Cannabis use disorder, moderate, dependence ([place]) [F12.20] 08/07/2016  . CHOLECYSTITIS, UNSPECIFIED [K81.9] 06/12/2008  . BILIARY COLIC [Q98.26] 41/58/3094  . CERUMEN IMPACTION, BILATERAL [H61.20] 01/17/2008  . PHARYNGITIS, VIRAL [J02.9] 01/17/2008  . NECK PAIN [M54.2] 01/17/2008    Total Time spent with patient: 45 minutes  Subjective:   [name] is a 42 y.o. female patient admitted with psychosis.    HPI:  42 yo female who was IVC'd by her daughter for bizarre behaviors.  On assessment, she reports she stopped taking her medications last April, "took it on and off." She started having altered mental status a couple of days ago which worsened to mild catatonia.  Medications help her and stopping them made her symptoms worse.  Today, she reported stressors with her children and visitation issues with her exhusband that compounded her symptoms.  Thought blocking on assessment with tangential thought process.  Calm and cooperative, denies suicidal/homicidal ideations and substance abuse.    Past Psychiatric History: bipolar disorder  Risk to Self: Suicidal Ideation: No Suicidal Intent: No Is patient at risk for suicide?: No Suicidal Plan?: No Access to Means: No What has been your use of drugs/alcohol within the last 12 months?: NA How many times?: 0 Other Self Harm Risks: (NA) Triggers for Past Attempts: Unknown Intentional Self Injurious Behavior: None Risk to Others: Homicidal Ideation: No Thoughts of  Harm to Others: No Current Homicidal Intent: No Current Homicidal Plan: No Access to Homicidal Means: No Identified Victim: NA History of harm to others?: No Assessment of Violence: None Noted Violent Behavior Description: NA Does patient have access to weapons?: No Criminal Charges Pending?: No Does patient have a court date: No Prior Inpatient Therapy: Prior Inpatient Therapy: Yes Prior Therapy Dates: 2018 Prior Therapy Facilty/Provider(s): [place], [place] Reason for Treatment: MH issues Prior Outpatient Therapy: Prior Outpatient Therapy: Yes Prior Therapy Dates: 2018(Per notes) Prior Therapy Facilty/Provider(s): [facility] Reason for Treatment: Med mang Does patient have an ACCT team?: No Does patient have Intensive In-House Services?  : No Does patient have [facility] services? : Yes Does patient have P4CC services?: No  Past Medical History:  Past Medical History:  Diagnosis Date  . Abnormal Pap smear   . Bipolar 1 disorder ([place])   . Diabetes mellitus without complication ([place])   . Fibroids   . IBS (irritable bowel syndrome)     Past Surgical History:  Procedure Laterality Date  . CERVICAL BIOPSY    . CHOLECYSTECTOMY    . TUBAL LIGATION     Family History:  Family History  Problem Relation Age of Onset  . Diabetes Father   . Diabetes Paternal Grandmother   . Diabetes Maternal Grandmother   . Mental illness Cousin    Family Psychiatric  History: cousin with mental illness Social History:  Social History   Substance and Sexual Activity  Alcohol Use Yes   Comment: occasional     Social History   Substance and Sexual Activity  Drug Use Yes   Comment: CBD oil    Social History   Socioeconomic History  .  Marital status: Married    Spouse name: Not on file  . Number of children: Not on file  . Years of education: Not on file  . Highest education level: Not on file  Occupational History  . Not on file  Social Needs  . Financial resource strain: Not on  file  . Food insecurity:    Worry: Not on file    Inability: Not on file  . Transportation needs:    Medical: Not on file    Non-medical: Not on file  Tobacco Use  . Smoking status: Current Every Day Smoker    Packs/day: 0.25    Years: 0.00    Pack years: 0.00    Types: Cigarettes  . Smokeless tobacco: Never Used  Substance and Sexual Activity  . Alcohol use: Yes    Comment: occasional  . Drug use: Yes    Comment: CBD oil  . Sexual activity: Not Currently  Lifestyle  . Physical activity:    Days per week: Not on file    Minutes per session: Not on file  . Stress: Not on file  Relationships  . Social connections:    Talks on phone: Not on file    Gets together: Not on file    Attends religious service: Not on file    Active member of club or organization: Not on file    Attends meetings of clubs or organizations: Not on file    Relationship status: Not on file  Other Topics Concern  . Not on file  Social History Narrative  . Not on file   Additional Social History:    Allergies:  No Known Allergies  Labs:  Results for orders placed or performed during the hospital encounter of 05/17/18 (from the past 48 hour(s))  CBG monitoring, ED     Status: Abnormal   Collection Time: 05/17/18  9:06 AM  Result Value Ref Range   Glucose-Capillary 318 (H) 70 - 99 mg/dL  CBC with Differential     Status: Abnormal   Collection Time: 05/17/18  9:48 AM  Result Value Ref Range   WBC 9.9 4.0 - 10.5 K/uL   RBC 4.46 3.87 - 5.11 MIL/uL   Hemoglobin 11.1 (L) 12.0 - 15.0 g/dL   HCT 36.1 36.0 - 46.0 %   MCV 80.9 80.0 - 100.0 fL   MCH 24.9 (L) 26.0 - 34.0 pg   MCHC 30.7 30.0 - 36.0 g/dL   RDW 16.6 (H) 11.5 - 15.5 %   Platelets 266 150 - 400 K/uL   nRBC 0.0 0.0 - 0.2 %   Neutrophils Relative % 80 %   Neutro Abs 8.0 (H) 1.7 - 7.7 K/uL   Lymphocytes Relative 13 %   Lymphs Abs 1.3 0.7 - 4.0 K/uL   Monocytes Relative 5 %   Monocytes Absolute 0.5 0.1 - 1.0 K/uL   Eosinophils Relative  1 %   Eosinophils Absolute 0.1 0.0 - 0.5 K/uL   Basophils Relative 1 %   Basophils Absolute 0.1 0.0 - 0.1 K/uL   Immature Granulocytes 0 %   Abs Immature Granulocytes 0.03 0.00 - 0.07 K/uL    Comment: Performed at [facility], [place] [address] [place], [place] [address]  Comprehensive metabolic panel     Status: Abnormal   Collection Time: 05/17/18  9:48 AM  Result Value Ref Range   Sodium 140 135 - 145 mmol/L   Potassium 3.0 (L) 3.5 - 5.1 mmol/L   Chloride 107 98 -  111 mmol/L   CO2 19 (L) 22 - 32 mmol/L   Glucose, Bld 300 (H) 70 - 99 mg/dL   BUN 9 6 - 20 mg/dL   Creatinine, Ser 0.76 0.44 - 1.00 mg/dL   Calcium 8.7 (L) 8.9 - 10.3 mg/dL   Total Protein 6.7 6.5 - 8.1 g/dL   Albumin 3.6 3.5 - 5.0 g/dL   AST 29 15 - 41 U/L   ALT 41 0 - 44 U/L   Alkaline Phosphatase 76 38 - 126 U/L   Total Bilirubin 1.5 (H) 0.3 - 1.2 mg/dL   GFR calc non Af Amer >60 >60 mL/min   GFR calc Af Amer >60 >60 mL/min    Comment: (NOTE) The eGFR has been calculated using the CKD EPI equation. This calculation has not been validated in all clinical situations. eGFR's persistently <60 mL/min signify possible Chronic Kidney Disease.    Anion gap 14 5 - 15    Comment: Performed at [facility], [place] [address] [place], [place] [address]  Magnesium     Status: None   Collection Time: 05/17/18  9:48 AM  Result Value Ref Range   Magnesium 1.7 1.7 - 2.4 mg/dL    Comment: Performed at [facility], [place] [address] [place], [place] [address]  Acetaminophen level     Status: Abnormal   Collection Time: 05/17/18  9:48 AM  Result Value Ref Range   Acetaminophen (Tylenol), Serum <10 (L) 10 - 30 ug/mL    Comment: (NOTE) Therapeutic concentrations vary significantly. A range of 10-30 ug/mL  may be an effective concentration for many patients. However, some  are best treated at concentrations outside of this range. Acetaminophen concentrations >150 ug/mL at 4  hours after ingestion  and >50 ug/mL at 12 hours after ingestion are often associated with  toxic reactions. Performed at [facility], [place] [address] [place], [place] [address]   Salicylate level     Status: None   Collection Time: 05/17/18  9:48 AM  Result Value Ref Range   Salicylate Lvl <7.3 2.8 - 30.0 mg/dL    Comment: Performed at [facility], [place] [address], [place], [place] [address]  I-Stat CG4 Lactic Acid, ED     Status: None   Collection Time: 05/17/18  9:51 AM  Result Value Ref Range   Lactic Acid, Venous 1.18 0.5 - 1.9 mmol/L  Rapid urine drug screen (hospital performed)     Status: Abnormal   Collection Time: 05/17/18 10:22 AM  Result Value Ref Range   Opiates NONE DETECTED NONE DETECTED   Cocaine NONE DETECTED NONE DETECTED   Benzodiazepines NONE DETECTED NONE DETECTED   Amphetamines NONE DETECTED NONE DETECTED   Tetrahydrocannabinol POSITIVE (A) NONE DETECTED   Barbiturates NONE DETECTED NONE DETECTED    Comment: (NOTE) DRUG SCREEN FOR MEDICAL PURPOSES ONLY.  IF CONFIRMATION IS NEEDED FOR ANY PURPOSE, NOTIFY LAB WITHIN 5 DAYS. LOWEST DETECTABLE LIMITS FOR URINE DRUG SCREEN Drug Class                     Cutoff (ng/mL) Amphetamine and metabolites    1000 Barbiturate and metabolites    200 Benzodiazepine                 242 Tricyclics and metabolites     300 Opiates and metabolites        300 Cocaine and metabolites        300 THC  50 Performed at [facility], [place] [address] [place], [place] [address]   Urinalysis, Routine w reflex microscopic     Status: Abnormal   Collection Time: 05/17/18 10:22 AM  Result Value Ref Range   Color, Urine YELLOW YELLOW   APPearance CLEAR CLEAR   Specific Gravity, Urine 1.021 1.005 - 1.030   pH 6.0 5.0 - 8.0   Glucose, UA >=500 (A) NEGATIVE mg/dL   Hgb urine dipstick NEGATIVE NEGATIVE   Bilirubin Urine NEGATIVE NEGATIVE   Ketones, ur  80 (A) NEGATIVE mg/dL   Protein, ur 30 (A) NEGATIVE mg/dL   Nitrite NEGATIVE NEGATIVE   Leukocytes, UA NEGATIVE NEGATIVE   RBC / HPF 0-5 0 - 5 RBC/hpf   WBC, UA 6-10 0 - 5 WBC/hpf   Bacteria, UA RARE (A) NONE SEEN   Squamous Epithelial / LPF 0-5 0 - 5   Mucus PRESENT     Comment: Performed at [facility], [place] [address], [place], [place] [address]  Pregnancy, urine     Status: None   Collection Time: 05/17/18 10:25 AM  Result Value Ref Range   Preg Test, Ur NEGATIVE NEGATIVE    Comment:        THE SENSITIVITY OF THIS METHODOLOGY IS >20 mIU/mL. Performed at [facility], [place] [address], [place], [place] [address]   Blood gas, venous     Status: Abnormal   Collection Time: 05/17/18 11:24 AM  Result Value Ref Range   pH, Ven 7.408 7.250 - 7.430   pCO2, Ven 34.1 (L) 44.0 - 60.0 mmHg   pO2, Ven 33.4 32.0 - 45.0 mmHg   Bicarbonate 21.1 20.0 - 28.0 mmol/L   Acid-base deficit 2.5 (H) 0.0 - 2.0 mmol/L   O2 Saturation 56.8 %   Patient temperature 98.6    Collection site VEIN    Drawn by COLLECTED BY NURSE    Sample type VENOUS     Comment: Performed at [place] [address], [place], [place] [address]  I-Stat CG4 Lactic Acid, ED     Status: None   Collection Time: 05/17/18 11:38 AM  Result Value Ref Range   Lactic Acid, Venous 1.06 0.5 - 1.9 mmol/L  POC CBG, ED     Status: Abnormal   Collection Time: 05/17/18 12:58 PM  Result Value Ref Range   Glucose-Capillary 226 (H) 70 - 99 mg/dL  TSH     Status: None   Collection Time: 05/17/18  5:24 PM  Result Value Ref Range   TSH 0.744 0.350 - 4.500 uIU/mL    Comment: Performed by a 3rd Generation assay with a functional sensitivity of <=0.01 uIU/mL. Performed at [facility], [place] [address], [place], [place] [address]   Prolactin     Status: None   Collection Time: 05/17/18  5:24 PM  Result Value Ref Range   Prolactin 11.7 4.8 - 23.3 ng/mL    Comment:  (NOTE) Performed At: [facility] [address] [place], [place] [id number] Rush Farmer MD IR:[id number]   POC CBG, ED     Status: Abnormal   Collection Time: 05/17/18  5:37 PM  Result Value Ref Range   Glucose-Capillary 180 (H) 70 - 99 mg/dL  POC CBG, ED     Status: Abnormal   Collection Time: 05/18/18  5:44 AM  Result Value Ref Range   Glucose-Capillary 203 (H) 70 - 99 mg/dL  POC CBG, ED     Status:  Abnormal   Collection Time: 05/18/18  8:17 AM  Result Value Ref Range   Glucose-Capillary 232 (H) 70 - 99 mg/dL    Current Facility-Administered Medications  Medication Dose Route Frequency Provider Last Rate Last Dose  . ARIPiprazole (ABILIFY) tablet 10 mg  10 mg Oral QHS Virgel Manifold, MD   10 mg at 05/17/18 2253  . buPROPion [facility]) tablet 100 mg  100 mg Oral Q breakfast Virgel Manifold, MD   100 mg at 05/18/18 0836  . divalproex (DEPAKOTE) DR tablet 500 mg  500 mg Oral Q12H Virgel Manifold, MD   500 mg at 05/18/18 0900  . metFORMIN (GLUCOPHAGE) tablet 500 mg  500 mg Oral BID WC Virgel Manifold, MD   500 mg at 05/18/18 0836  . traZODone (DESYREL) tablet 100 mg  100 mg Oral QHS Ethelene Hal, NP   100 mg at 05/17/18 2254   Current Outpatient Medications  Medication Sig Dispense Refill  . ARIPiprazole (ABILIFY) 10 MG tablet Take 1 tablet (10 mg total) by mouth at bedtime. For mood control (Patient not taking: Reported on 05/18/2018) 14 tablet 0  . ARIPiprazole ER 400 MG SRER Inject 400 mg into the muscle every 30 (thirty) days. For mood control next injection due 04/02/17 (Patient not taking: Reported on 05/18/2018) 1 each 0  . benztropine (COGENTIN) 0.5 MG tablet Take 1 tablet (0.5 mg total) by mouth at bedtime. For side effects to medications (Patient not taking: Reported on 05/18/2018) 30 tablet 0  . buPROPion (WELLBUTRIN) 100 MG tablet Take 1 tablet (100 mg total) by mouth daily with breakfast. For mood control (Patient not taking: Reported on 05/18/2018) 30  tablet 0  . divalproex (DEPAKOTE) 500 MG DR tablet Take 1 tablet (500 mg total) by mouth every 12 (twelve) hours. For mood control (Patient not taking: Reported on 05/18/2018) 60 tablet 0  . ferrous sulfate 325 (65 FE) MG tablet Take 1 tablet (325 mg total) by mouth daily with breakfast. For iron level (Patient not taking: Reported on 05/18/2018) 30 tablet 0  . metFORMIN (GLUCOPHAGE) 500 MG tablet Take 1 tablet (500 mg total) by mouth 2 (two) times daily with a meal. For diabetes (Patient not taking: Reported on 05/18/2018) 60 tablet 0  . nystatin (MYCOSTATIN/NYSTOP) powder Apply topically 2 (two) times daily. (Patient not taking: Reported on 05/18/2018) 60 g 2  . terconazole (TERAZOL 3) 0.8 % vaginal cream Place 1 applicator vaginally at bedtime. (Patient not taking: Reported on 05/18/2018) 20 g 0  . tinidazole (TINDAMAX) 500 MG tablet Take 4 tablets (2,000 mg total) by mouth daily with breakfast. (Patient not taking: Reported on 05/18/2018) 12 tablet 0  . traZODone (DESYREL) 150 MG tablet Take 1 tablet (150 mg total) by mouth at bedtime. For mood control and sleep (Patient not taking: Reported on 05/18/2018) 30 tablet 0    Musculoskeletal: Strength & Muscle Tone: within normal limits Gait & Station: normal Patient leans: N/A  Psychiatric Specialty Exam: Physical Exam  Nursing note and vitals reviewed. Constitutional: She is oriented to person, place, and time. She appears well-developed and well-nourished.  HENT:  Head: Normocephalic.  Neck: Normal range of motion.  Respiratory: Effort normal.  Musculoskeletal: Normal range of motion.  Neurological: She is alert and oriented to person, place, and time.  Psychiatric: Judgment normal. Her affect is blunt. Her speech is tangential. She is slowed. Thought content is paranoid. Cognition and memory are impaired. She exhibits a depressed mood.    Review of Systems  Psychiatric/Behavioral:  Positive for depression.  All other systems reviewed  and are negative.   Blood pressure 124/80, pulse 76, temperature 98.4 F (36.9 C), temperature source Oral, resp. rate 18, SpO2 100 %.There is no height or weight on file to calculate BMI.  General Appearance: Casual  Eye Contact:  Fair  Speech:  Slow  Volume:  Decreased  Mood:  Depressed  Affect:  Blunt  Thought Process:  Descriptions of Associations: Tangential  Orientation:  Full (Time, Place, and Person)  Thought Content:  Paranoid Ideation and Rumination  Suicidal Thoughts:  No  Homicidal Thoughts:  No  Memory:  Immediate;   Fair Recent;   Fair Remote;   Fair  Judgement:  Impaired  Insight:  Fair  Psychomotor Activity:  Decreased  Concentration:  Concentration: Fair and Attention Span: Fair  Recall:  [employer] of Knowledge:  Fair  Language:  Fair  Akathisia:  No  Handed:  Right  AIMS (if indicated):     Assets:  Housing Leisure Time Physical Health Resilience Social Support  ADL's:  Intact  Cognition:  Impaired,  Mild  Sleep:        Treatment Plan Summary: Daily contact with patient to assess and evaluate symptoms and progress in treatment, Medication management and Plan schizoaffective disorder, bipolar type:  -Started Depakote 500 mg BID for mood stabilization -Started Abilify 10 mg daily for mood stabilization -Started Cogentin 0.5 mg daily to prevent EPS  Depression: -Started Wellbutrin 100 mg daily for depression  Insomnia: -Started Trazodone 100 mg at bedtime for sleep  -Seek inpatient hospitalization  Disposition: Recommend psychiatric Inpatient admission when medically cleared.  Waylan Boga, NP 05/18/2018 10:26 AM  Patient seen face-to-face for psychiatric evaluation, chart reviewed and case discussed with the physician extender and developed treatment plan. Reviewed the information documented and agree with the treatment plan. Corena Pilgrim, MD

## 2018-05-18 NOTE — Progress Notes (Signed)
Adult Psychoeducational Group Note  Date:  05/18/2018 Time:  11:31 PM  Group Topic/Focus:  Wrap-Up Group:   The focus of this group is to help patients review their daily goal of treatment and discuss progress on daily workbooks.  Participation Level:  Active  Participation Quality:  Appropriate  Affect:  Appropriate  Cognitive:  Appropriate  Insight: Appropriate  Engagement in Group:  Engaged  Modes of Intervention:  Discussion  Additional Comments:  Pt is a new admission. Pt stated her goal for today was to focus on her treatment plan and interact with her new peers. Pt rated her over all day a 4 out of 10.  Candy Sledge 05/18/2018, 11:31 PM

## 2018-05-19 DIAGNOSIS — F3164 Bipolar disorder, current episode mixed, severe, with psychotic features: Principal | ICD-10-CM

## 2018-05-19 LAB — GLUCOSE, CAPILLARY
Glucose-Capillary: 293 mg/dL — ABNORMAL HIGH (ref 70–99)
Glucose-Capillary: 252 mg/dL — ABNORMAL HIGH (ref 70–99)

## 2018-05-19 MED ORDER — LORAZEPAM 1 MG PO TABS
1.0000 mg | ORAL_TABLET | Freq: Three times a day (TID) | ORAL | Status: AC
  Administered 2018-05-19 – 2018-05-21 (×6): 1 mg via ORAL
  Filled 2018-05-19 (×6): qty 1

## 2018-05-19 NOTE — B[facility] Group Notes (Signed)
[facility] LCSW Group Therapy Note  Date/Time:  05/19/2018  11:00AM-12:00PM  Type of Therapy and Topic:  Group Therapy:  Music and Mood  Participation Level:  Active   Description of Group: In this process group, members listened to a variety of genres of music and identified that different types of music evoke different responses.  Patients were encouraged to identify music that was soothing for them and music that was energizing for them.  Patients discussed how this knowledge can help with wellness and recovery in various ways including managing depression and anxiety as well as encouraging healthy sleep habits.    Therapeutic Goals: 1. Patients will explore the impact of different varieties of music on mood 2. Patients will verbalize the thoughts they have when listening to different types of music 3. Patients will identify music that is soothing to them as well as music that is energizing to them 4. Patients will discuss how to use this knowledge to assist in maintaining wellness and recovery 5. Patients will explore the use of music as a coping skill  Summary of Patient Progress:  At the beginning of group, patient expressed that she felt "uneasy" and she did participate through active listening even though she did not comment much.  At the end of group she said the music "helped to relax me and inspired me."  Therapeutic Modalities: Solution Focused Brief Therapy Activity   Selmer Dominion, LCSW

## 2018-05-19 NOTE — Progress Notes (Addendum)
Patient ID: [name], female   DOB: [birth year], 42 y.o.   MRN: [id number]  D. Pt presents with a flat affect and guarded behavior. Pt still presents as thought blocking. Adheres to medication regimen. Reports increased anxiety today and concerns about taking medications properly at discharge. Seen pacing the hallway often.   A. Labs and vitals monitored. Pt given and educated on medications. Pt supported emotionally and encouraged to express concerns and ask questions. Worked with patient on anxiety relieving techniques, patient was able to write a list of goals for time at the hospital and at discharge. Pt also reports listening to music as a coping skill for anxiety.   R. Pt remains safe with 15 minute checks.Pt currently denies SI/HI and AVH and agrees to contact staff before acting on any harmful thoughts. Pt reports she was able to effectively use coping skills and states "thank you. I feel better now." Will continue POC.

## 2018-05-19 NOTE — Progress Notes (Signed)
Adult Psychoeducational Group Note  Date:  05/19/2018 Time:  9:15 PM  Group Topic/Focus:  Wrap-Up Group:   The focus of this group is to help patients review their daily goal of treatment and discuss progress on daily workbooks.  Participation Level:  Active  Participation Quality:  Appropriate  Affect:  Appropriate  Cognitive:  Appropriate  Insight: Appropriate  Engagement in Group:  Engaged  Modes of Intervention:  Socialization and Support  Additional Comments:  Patient attended and participated in group tonight. She reports that here day was a 8. The morning went slow. She stayed to herself and talked to herself. She spoke to her mother today and her nurse today.  Salley Scarlet [facility] 05/19/2018, 9:15 PM

## 2018-05-19 NOTE — H&P (Signed)
Psychiatric Admission Assessment Adult  Patient Identification: [name] MRN:  [id number] Date of Evaluation:  05/19/2018 Chief Complaint:  Bipolar disorder,current episode mixed with psychotic features Principal Diagnosis: Catatonic features, exacerbation of bipolar disorder Diagnosis: Bipolar mixed with psychosis/catatonia Patient Active Problem List   Diagnosis Date Noted  . Schizoaffective disorder, bipolar type ([place]) [F25.0] 05/18/2018  . Diabetes mellitus ([place]) [E11.9] 08/10/2016  . Bipolar disorder, curr episode mixed, severe, with psychotic features ([place]) [F31.64] 08/07/2016  . Cannabis use disorder, moderate, dependence ([place]) [F12.20] 08/07/2016  . CHOLECYSTITIS, UNSPECIFIED [K81.9] 06/12/2008  . BILIARY COLIC [G25.42] 70/62/3762  . CERUMEN IMPACTION, BILATERAL [H61.20] 01/17/2008  . PHARYNGITIS, VIRAL [J02.9] 01/17/2008  . NECK PAIN [M54.2] 01/17/2008   History of Present Illness:   This is a repeat admission for [name] She is 42 years of age and apparently discontinued her bipolar medications in April, which apparently included aripiprazole and Depakote. The patient was noted to be in a disorganized state and catatonic features were noted in the emergency department. When I interview her she states she saw something on her table that may have been a hallucination describing it as a bag she was unaware had previously been there she states she then passed out.  Therefore she is somewhat of an unreliable historian she is anxious she is not fully oriented she is cooperative though and attempting to answer questions but again is consumed with some degree of anxiety.  She denies current auditory visual loose Nations or thoughts of harming herself or others she states she knows where she is but will not name the facility states she has been here before, states she is not on medication cannot name previous medications.  Cannabis is once again found in her urine drug screen  this is a chronic issue according to the chart Daughter had called EMS due to these mental status changes apparently patient had been somewhat stable off her medications for period of time  Associated Signs/Symptoms: Depression Symptoms:  psychomotor agitation, psychomotor retardation, (Hypo) Manic Symptoms:  labile Anxiety Symptoms:  Generally anxious Psychotic Symptoms:  Hallucinations: Visual PTSD Symptoms: Negative Total Time spent with patient: 30 minutes  Past Psychiatric History: Extensive and gleaned from the chart  Is the patient at risk to self? Yes.    Has the patient been a risk to self in the past 6 months? Yes.    Has the patient been a risk to self within the distant past? Yes.    Is the patient a risk to others? No.  Has the patient been a risk to others in the past 6 months? No.  Has the patient been a risk to others within the distant past? No.   Prior Inpatient Therapy:   Prior Outpatient Therapy:    Alcohol Screening: 1. How often do you have a drink containing alcohol?: Never 2. How many drinks containing alcohol do you have on a typical day when you are drinking?: 1 or 2 3. How often do you have six or more drinks on one occasion?: Never AUDIT-C Score: 0 4. How often during the last year have you found that you were not able to stop drinking once you had started?: Never 5. How often during the last year have you failed to do what was normally expected from you becasue of drinking?: Never 6. How often during the last year have you needed a first drink in the morning to get yourself going after a heavy drinking session?: Never 7. How often during the last  year have you had a feeling of guilt of remorse after drinking?: Never 8. How often during the last year have you been unable to remember what happened the night before because you had been drinking?: Never 9. Have you or someone else been injured as a result of your drinking?: No 10. Has a relative or friend  or a doctor or another health worker been concerned about your drinking or suggested you cut down?: No Alcohol Use Disorder Identification Test Final Score (AUDIT): 0 Substance Abuse History in the last 12 months:  Yes.   Consequences of Substance Abuse: Negative Previous Psychotropic Medications: Yes  Psychological Evaluations: No  Past Medical History:  Past Medical History:  Diagnosis Date  . Abnormal Pap smear   . Bipolar 1 disorder ([place])   . Diabetes mellitus without complication ([place])   . Fibroids   . IBS (irritable bowel syndrome)     Past Surgical History:  Procedure Laterality Date  . CERVICAL BIOPSY    . CHOLECYSTECTOMY    . TUBAL LIGATION     Family History:  Family History  Problem Relation Age of Onset  . Diabetes Father   . Diabetes Paternal Grandmother   . Diabetes Maternal Grandmother   . Mental illness Cousin    Family Psychiatric  History: No new data shared Tobacco Screening:   Social History:  Social History   Substance and Sexual Activity  Alcohol Use Yes   Comment: occasional     Social History   Substance and Sexual Activity  Drug Use Yes   Comment: CBD oil    Allergies:  No Known Allergies Lab Results:  Results for orders placed or performed during the hospital encounter of 05/18/18 (from the past 48 hour(s))  Glucose, capillary     Status: Abnormal   Collection Time: 05/19/18  7:33 AM  Result Value Ref Range   Glucose-Capillary 293 (H) 70 - 99 mg/dL    Blood Alcohol level:  Lab Results  Component Value Date   ETH <5 02/24/2017   ETH <5 02/63/7858    Metabolic Disorder Labs:  Lab Results  Component Value Date   HGBA1C 7.5 (H) 02/27/2017   MPG 168.55 02/27/2017   MPG 171 08/06/2016   Lab Results  Component Value Date   PROLACTIN 11.7 05/17/2018   PROLACTIN 105.5 (H) 02/28/2017   Lab Results  Component Value Date   CHOL 148 02/28/2017   TRIG 116 02/28/2017   HDL 36 (L) 02/28/2017   CHOLHDL 4.1 02/28/2017   VLDL 23  02/28/2017   LDLCALC 89 02/28/2017   LDLCALC 106 (H) 08/06/2016    Current Medications: Current Facility-Administered Medications  Medication Dose Route Frequency Provider Last Rate Last Dose  . acetaminophen (TYLENOL) tablet 650 mg  650 mg Oral Q6H PRN Patrecia Pour, NP      . alum & mag hydroxide-simeth (MAALOX/MYLANTA) 200-200-20 MG/5ML suspension 30 mL  30 mL Oral Q4H PRN Patrecia Pour, NP      . ARIPiprazole (ABILIFY) tablet 10 mg  10 mg Oral QHS Patrecia Pour, NP   10 mg at 05/18/18 2139  . buPROPion [facility]) tablet 100 mg  100 mg Oral Q breakfast Patrecia Pour, NP   100 mg at 05/19/18 0757  . divalproex (DEPAKOTE) DR tablet 500 mg  500 mg Oral Q12H Patrecia Pour, NP   500 mg at 05/19/18 0758  . LORazepam (ATIVAN) tablet 1 mg  1 mg Oral TID Johnn Hai, MD      .  magnesium hydroxide (MILK OF MAGNESIA) suspension 30 mL  30 mL Oral Daily PRN Patrecia Pour, NP      . metFORMIN (GLUCOPHAGE) tablet 500 mg  500 mg Oral BID WC Patrecia Pour, NP   500 mg at 05/19/18 0757  . traZODone (DESYREL) tablet 100 mg  100 mg Oral QHS Patrecia Pour, NP   100 mg at 05/18/18 2139   PTA Medications: Medications Prior to Admission  Medication Sig Dispense Refill Last Dose  . ARIPiprazole (ABILIFY) 10 MG tablet Take 1 tablet (10 mg total) by mouth at bedtime. For mood control (Patient not taking: Reported on 05/18/2018) 14 tablet 0 Not Taking at Unknown time  . ARIPiprazole ER 400 MG SRER Inject 400 mg into the muscle every 30 (thirty) days. For mood control next injection due 04/02/17 (Patient not taking: Reported on 05/18/2018) 1 each 0 Not Taking at Unknown time  . benztropine (COGENTIN) 0.5 MG tablet Take 1 tablet (0.5 mg total) by mouth at bedtime. For side effects to medications (Patient not taking: Reported on 05/18/2018) 30 tablet 0 Not Taking at Unknown time  . buPROPion (WELLBUTRIN) 100 MG tablet Take 1 tablet (100 mg total) by mouth daily with breakfast. For mood control (Patient  not taking: Reported on 05/18/2018) 30 tablet 0 Not Taking at Unknown time  . divalproex (DEPAKOTE) 500 MG DR tablet Take 1 tablet (500 mg total) by mouth every 12 (twelve) hours. For mood control (Patient not taking: Reported on 05/18/2018) 60 tablet 0 Not Taking at Unknown time  . ferrous sulfate 325 (65 FE) MG tablet Take 1 tablet (325 mg total) by mouth daily with breakfast. For iron level (Patient not taking: Reported on 05/18/2018) 30 tablet 0 Not Taking at Unknown time  . metFORMIN (GLUCOPHAGE) 500 MG tablet Take 1 tablet (500 mg total) by mouth 2 (two) times daily with a meal. For diabetes (Patient not taking: Reported on 05/18/2018) 60 tablet 0 Not Taking at Unknown time  . nystatin (MYCOSTATIN/NYSTOP) powder Apply topically 2 (two) times daily. (Patient not taking: Reported on 05/18/2018) 60 g 2 Not Taking at Unknown time  . terconazole (TERAZOL 3) 0.8 % vaginal cream Place 1 applicator vaginally at bedtime. (Patient not taking: Reported on 05/18/2018) 20 g 0 Not Taking at Unknown time  . tinidazole (TINDAMAX) 500 MG tablet Take 4 tablets (2,000 mg total) by mouth daily with breakfast. (Patient not taking: Reported on 05/18/2018) 12 tablet 0 Not Taking at Unknown time  . traZODone (DESYREL) 150 MG tablet Take 1 tablet (150 mg total) by mouth at bedtime. For mood control and sleep (Patient not taking: Reported on 05/18/2018) 30 tablet 0 Not Taking at Unknown time    Musculoskeletal: Strength & Muscle Tone: within normal limits Gait & Station: normal Patient leans: N/A  Psychiatric Specialty Exam: Physical Exam  ROS  Blood pressure 130/86, pulse 87, temperature 98.4 F (36.9 C), temperature source Oral, resp. rate 20, height 5\' 11"  (1.803 m), weight 117.9 kg, SpO2 100 %.Body mass index is 36.26 kg/m.  General Appearance: Casual  Eye Contact:  Fair  Speech:  Pressured  Volume:  Decreased  Mood:  Anxious  Affect:  Constricted  Thought Process:  Disorganized  Orientation:  Full  (Time, Place, and Person)  Thought Content:  Hallucinations: Visual  Suicidal Thoughts:  No  Homicidal Thoughts:  No  Memory:  Immediate;   Poor  Judgement:  Impaired  Insight:  Fair  Psychomotor Activity:  Decreased  Concentration:  Concentration:  Poor  Recall:  Poor  Fund of Knowledge:  Poor  Language:  Fair  Akathisia:  NA  Handed:  Right  AIMS (if indicated):     Assets:  Desire for Improvement  ADL's:  Intact  Cognition:  Impaired,  Moderate  Sleep:  Number of Hours: 6.25    Treatment Plan Summary: Daily contact with patient to assess and evaluate symptoms and progress in treatment and Medication management  Observation Level/Precautions:  15 minute checks  Laboratory:  UDS  Psychotherapy:    Medications:    Consultations:    Discharge Concerns:    Estimated LOS:  Other:     Physician Treatment Plan for Primary Diagnosis: <principal problem not specified> Long Term Goal(s): Improvement in symptoms so as ready for discharge  Short Term Goals: Ability to identify changes in lifestyle to reduce recurrence of condition will improve  Physician Treatment Plan for Secondary Diagnosis: Active Problems:   Schizoaffective disorder, bipolar type ([place])  Long Term Goal(s): Improvement in symptoms so as ready for discharge  Short Term Goals: Ability to identify changes in lifestyle to reduce recurrence of condition will improve   In summary, 42 year old patient noncompliant with medications since April leading to exacerbation and underlying bipolar type condition with psychosis/catatonia/complicated by chronic cannabis abuse versus dependence.  Medication orders are appropriate will add lorazepam as that generally helps with catatonia/decrease if too sedating  I certify that inpatient services furnished can reasonably be expected to improve the patient's condition.    Johnn Hai, MD 11/17/20199:00 AM
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
## 2018-05-20 NOTE — Progress Notes (Signed)
Recreation Therapy Notes  Date: 11.18.19 Time: 1000 Location: 500 Hall Dayroom  Group Topic: Coping Skills  Goal Area(s) Addresses:  Patient will be able to identify positive coping skills. Patient will be able to identify benefits of using coping skills post d/c.  Behavioral Response: Engaged  Intervention: Worksheet  Activity: Mind map.  LRT and patients filled in the first 8 boxes together (anxiety, depression, stress, loneliness, anger, deceitfulness, finances and change).  Patients were to fill in the remaining boxes individually with coping skills for each issue presented before reconvening as a group.  LRT would then write on the coping skills on the board with their respective issue.  Education: Radiographer, therapeutic, Dentist.   Education Outcome: Acknowledges understanding/In group clarification offered/Needs additional education.   Clinical Observations/Feedback:  Pt was quiet but engaged.  Pt identified some of her coping skills as spend time with family/friends, talk to someone, walk away, say less/move away from the person, budget and don't impulsive shop     Tod Abrahamsen Ria Comment, LRT/CTRS     Victorino Sparrow A 05/20/2018 12:27 PM

## 2018-05-20 NOTE — Progress Notes (Addendum)
Writer spoke with patient 1:1 and she reports that she is ready to go home now because the holidays are coming up. 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 She appears much clearer and did not notice any thought blocking. She has been up in the dayroom interacting with peers. Support given and safety maintained on unit with 15 min checks.

## 2018-05-20 NOTE — Progress Notes (Signed)
Patient ID: [name], female   DOB: [birth year], 42 y.o.   MRN: [id number]   Pt currently presents with a blunted affect and cooperative behavior. Reports ongoing anxiety. Pt states goal is to "focus on schedule and medications." Pt reports that she has been having ongoing passive SI today.   Pt provided with medications per providers orders. Pt's labs and vitals were monitored during the shift. Pt given a 1:1 about emotional and mental status. Pt supported and encouraged to express concerns and questions. Pt educated on medications.  Pt's safety ensured with 15 minute and environmental checks. Pt currently denies SI/HI and A/V hallucinations. Pt verbally agrees to seek staff if HI or A/VH occurs and to consult with staff before acting on any harmful thoughts. Will continue POC.

## 2018-05-20 NOTE — [facility] Group Notes (Cosign Needed)
Adult Psychoeducational Group Note  Date:  05/20/2018 Time:  9:05 AM  Group Topic/Focus:  Goals Group:   The focus of this group is to help patients establish daily goals to achieve during treatment and discuss how the patient can incorporate goal setting into their daily lives to aide in recovery.  Participation Level:  Active  Participation Quality:  Appropriate  Affect:  Appropriate  Cognitive:  Appropriate  Insight: Appropriate  Engagement in Group:  Engaged  Modes of Intervention:  Education  Additional Comments:  Pt attended group and talked about the goal she wanted to work on.  Paris Lore Pilar Corrales 05/20/2018, 9:05 AM

## 2018-05-20 NOTE — [facility] Suicide Risk Assessment (Signed)
[facility] INPATIENT:  Family/Significant Other Suicide Prevention Education  Suicide Prevention Education:  Education Completed; Rikki Spearing, Mother, [phone], has been identified by the patient as the family member/significant other with whom the patient will be residing, and identified as the person(s) who will aid the patient in the event of a mental health crisis (suicidal ideations/suicide attempt).  With written consent from the patient, the family member/significant other has been provided the following suicide prevention education, prior to the and/or following the discharge of the patient.  The suicide prevention education provided includes the following:  Suicide risk factors  Suicide prevention and interventions  National Suicide Hotline telephone number  [facility] assessment telephone number  [facility] Emergency Assistance [place] and/or Residential Mobile Crisis Unit telephone number  Request made of family/significant other to:  Remove weapons (e.g., guns, rifles, knives), all items previously/currently identified as safety concern.  [facility] is not aware of any guns that pt has access to.    Remove drugs/medications (over-the-counter, prescriptions, illicit drugs), all items previously/currently identified as a safety concern.  The family member/significant other verbalizes understanding of the suicide prevention education information provided.  The family member/significant other agrees to remove the items of safety concern listed above.  Mother lives in [place], [place], but does talk to pt daily and will continue to do so moving forward.   Joanne Chars 05/20/2018, 3:59 PM

## 2018-05-20 NOTE — Progress Notes (Signed)
Adult Psychoeducational Group Note  Date:  05/20/2018 Time:  8:33 PM  Group Topic/Focus:  Wrap-Up Group:   The focus of this group is to help patients review their daily goal of treatment and discuss progress on daily workbooks.  Participation Level:  Active  Participation Quality:  Appropriate  Affect:  Appropriate  Cognitive:  Appropriate  Insight: Appropriate  Engagement in Group:  Engaged  Modes of Intervention:  Discussion  Additional Comments: The patient expressed that she rates today a 10.The patient also said that she attended groups and reached her goals.  Nash Shearer 05/20/2018, 8:33 PM

## 2018-05-20 NOTE — Progress Notes (Signed)
Patient ID: [name], female   DOB: 02-Aug-1975, 42 y.o.   MRN: [id number]  Pt reports Ativan schedule may be contributing to her lethargy. Pt states "I like the way I feel but I have to be able to function when I go home." Pt requests to take Ativan at 0800, 1700 and bedtime.

## 2018-05-20 NOTE — Progress Notes (Signed)
Pt was observed in the dayroom, seem eating a snack. Pt appears animated in affect and mood. Pt denies SI/HI/AVH/Pain at this time. Pt states she hopes to be discharge early tomorrow due to arrange transportation with family. Pt was cooperative and pleasant in the milieu. No new c/o's. Support and encouragement provided. Will continue with POC.

## 2018-05-20 NOTE — Progress Notes (Signed)
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 Schizoaffective disorder, bipolar type ([place]) [F25.0] 05/18/2018  . Diabetes mellitus ([place]) [E11.9] 08/10/2016  . Bipolar disorder, curr episode mixed, severe, with psychotic features ([place]) [F31.64] 08/07/2016  . Cannabis use disorder, moderate, dependence ([place]) [F12.20] 08/07/2016  . CHOLECYSTITIS, UNSPECIFIED [K81.9] 06/12/2008  . BILIARY COLIC [S97.02] 63/78/5885  . CERUMEN IMPACTION, BILATERAL [H61.20] 01/17/2008  . PHARYNGITIS, VIRAL [J02.9] 01/17/2008  . NECK PAIN [M54.2] 01/17/2008   Total Time spent with patient: 20 minutes  Past Medical History:  Past Medical History:  Diagnosis Date  . Abnormal Pap smear   . Bipolar 1 disorder ([place])   . Diabetes mellitus without complication ([place])   . Fibroids   . IBS (irritable bowel syndrome)     Past Surgical History:  Procedure Laterality Date  . CERVICAL BIOPSY    . CHOLECYSTECTOMY    . TUBAL LIGATION     Family History:  Family History  Problem Relation Age of Onset  . Diabetes Father   . Diabetes Paternal Grandmother   . Diabetes Maternal Grandmother   . Mental illness Cousin    Social History:  Social History   Substance and Sexual Activity  Alcohol Use Yes   Comment: occasional     Social History   Substance and Sexual Activity  Drug Use Yes   Comment: CBD oil    Social History   Socioeconomic History  . Marital status: Married    Spouse  name: Not on file  . Number of children: Not on file  . Years of education: Not on file  . Highest education level: Not on file  Occupational History  . Not on file  Social Needs  . Financial resource strain: Not on file  . Food insecurity:    Worry: Not on file    Inability: Not on file  . Transportation needs:    Medical: Not on file    Non-medical: Not on file  Tobacco Use  . Smoking status: Current Every Day Smoker    Packs/day: 0.25    Years: 0.00    Pack years: 0.00    Types: Cigarettes  . Smokeless tobacco: Never Used  Substance and Sexual Activity  . Alcohol use: Yes    Comment: occasional  . Drug use: Yes    Comment: CBD oil  . Sexual activity: Not Currently  Lifestyle  . Physical activity:    Days per week: Not on file    Minutes per session: Not on file  . Stress: Not on file  Relationships  . Social connections:    Talks on phone: Not on file    Gets together: Not on file    Attends religious service: Not on file    Active member of club or organization: Not on file    Attends meetings of clubs or organizations: Not on file    Relationship status: Not on file  Other Topics Concern  . Not on file  Social History Narrative  . Not on file   Additional  Social History:                         Sleep: Good  Appetite:  Good  Current Medications: Current Facility-Administered Medications  Medication Dose Route Frequency Provider Last Rate Last Dose  . acetaminophen (TYLENOL) tablet 650 mg  650 mg Oral Q6H PRN Patrecia Pour, NP      . alum & mag hydroxide-simeth (MAALOX/MYLANTA) 200-200-20 MG/5ML suspension 30 mL  30 mL Oral Q4H PRN Patrecia Pour, NP      . ARIPiprazole (ABILIFY) tablet 10 mg  10 mg Oral QHS Patrecia Pour, NP   10 mg at 05/19/18 2248  . buPROPion [facility]) tablet 100 mg  100 mg Oral Q breakfast Patrecia Pour, NP   100 mg at 05/20/18 0804  . divalproex (DEPAKOTE) DR tablet 500 mg  500 mg Oral Q12H Patrecia Pour, NP    500 mg at 05/20/18 0804  . LORazepam (ATIVAN) tablet 1 mg  1 mg Oral TID Johnn Hai, MD   1 mg at 05/20/18 0804  . magnesium hydroxide (MILK OF MAGNESIA) suspension 30 mL  30 mL Oral Daily PRN Patrecia Pour, NP      . metFORMIN (GLUCOPHAGE) tablet 500 mg  500 mg Oral BID WC Patrecia Pour, NP   500 mg at 05/20/18 0804  . traZODone (DESYREL) tablet 100 mg  100 mg Oral QHS Patrecia Pour, NP   100 mg at 05/19/18 2248    Lab Results:  Results for orders placed or performed during the hospital encounter of 05/18/18 (from the past 48 hour(s))  Glucose, capillary     Status: Abnormal   Collection Time: 05/19/18  7:33 AM  Result Value Ref Range   Glucose-Capillary 293 (H) 70 - 99 mg/dL  Glucose, capillary     Status: Abnormal   Collection Time: 05/19/18  2:32 PM  Result Value Ref Range   Glucose-Capillary 252 (H) 70 - 99 mg/dL    Blood Alcohol level:  Lab Results  Component Value Date   ETH <5 02/24/2017   ETH <5 02/40/9735    Metabolic Disorder Labs: Lab Results  Component Value Date   HGBA1C 7.5 (H) 02/27/2017   MPG 168.55 02/27/2017   MPG 171 08/06/2016   Lab Results  Component Value Date   PROLACTIN 11.7 05/17/2018   PROLACTIN 105.5 (H) 02/28/2017   Lab Results  Component Value Date   CHOL 148 02/28/2017   TRIG 116 02/28/2017   HDL 36 (L) 02/28/2017   CHOLHDL 4.1 02/28/2017   VLDL 23 02/28/2017   LDLCALC 89 02/28/2017   LDLCALC 106 (H) 08/06/2016    Physical Findings: AIMS:  , ,  ,  ,    CIWA:    COWS:     Musculoskeletal: Strength & Muscle Tone: within normal limits Gait & Station: normal Patient leans: N/A  Psychiatric Specialty Exam: Physical Exam  ROS  Blood pressure 135/84, pulse (!) 102, temperature 98.8 F (37.1 C), temperature source Oral, resp. rate 20, height 5\' 11"  (1.803 m), weight 117.9 kg, SpO2 100 %.Body mass index is 36.26 kg/m.  General Appearance: Casual  Eye Contact:  Good  Speech:  Clear and Coherent  Volume:  Normal  Mood:   Euthymic  Affect:  Appropriate  Thought Process:  Coherent  Orientation:  Full (Time, Place, and Person)  Thought Content:  Logical  Suicidal Thoughts:  No  Homicidal Thoughts:  No  Memory:  Immediate;   Good  Judgement:  Good  Insight:  Good  Psychomotor Activity:  Normal  Concentration:  Concentration: Good  Recall:  Good  Fund of Knowledge:  Good  Language:  Good  Akathisia:  Negative  Handed:  Right  AIMS (if indicated):     Assets:  Communication Skills  ADL's:  Intact  Cognition:  WNL  Sleep:  Number of Hours: 5.75   Probable discharge tomorrow  Treatment Plan Summary: Daily contact with patient to assess and evaluate symptoms and progress in treatment and Medication management  Isahi Godwin, MD 05/20/2018, 9:48 AM

## 2018-05-20 NOTE — Tx Team (Signed)
Interdisciplinary Treatment and Diagnostic Plan Update  05/20/2018 Time of Session: 7628 [name] MRN: [id number]  Principal Diagnosis: <principal problem not specified>  Secondary Diagnoses: Active Problems:   Schizoaffective disorder, bipolar type (HCC)   Current Medications:  Current Facility-Administered Medications  Medication Dose Route Frequency Provider Last Rate Last Dose  . acetaminophen (TYLENOL) tablet 650 mg  650 mg Oral Q6H PRN Patrecia Pour, NP      . alum & mag hydroxide-simeth (MAALOX/MYLANTA) 200-200-20 MG/5ML suspension 30 mL  30 mL Oral Q4H PRN Patrecia Pour, NP      . ARIPiprazole (ABILIFY) tablet 10 mg  10 mg Oral QHS Patrecia Pour, NP   10 mg at 05/19/18 2248  . buPROPion [facility]) tablet 100 mg  100 mg Oral Q breakfast Patrecia Pour, NP   100 mg at 05/20/18 0804  . divalproex (DEPAKOTE) DR tablet 500 mg  500 mg Oral Q12H Patrecia Pour, NP   500 mg at 05/20/18 0804  . LORazepam (ATIVAN) tablet 1 mg  1 mg Oral TID Johnn Hai, MD   1 mg at 05/20/18 1115  . magnesium hydroxide (MILK OF MAGNESIA) suspension 30 mL  30 mL Oral Daily PRN Patrecia Pour, NP      . metFORMIN (GLUCOPHAGE) tablet 500 mg  500 mg Oral BID WC Patrecia Pour, NP   500 mg at 05/20/18 0804  . traZODone (DESYREL) tablet 100 mg  100 mg Oral QHS Patrecia Pour, NP   100 mg at 05/19/18 2248   PTA Medications: Medications Prior to Admission  Medication Sig Dispense Refill Last Dose  . ARIPiprazole (ABILIFY) 10 MG tablet Take 1 tablet (10 mg total) by mouth at bedtime. For mood control (Patient not taking: Reported on 05/18/2018) 14 tablet 0 Not Taking at Unknown time  . ARIPiprazole ER 400 MG SRER Inject 400 mg into the muscle every 30 (thirty) days. For mood control next injection due 04/02/17 (Patient not taking: Reported on 05/18/2018) 1 each 0 Not Taking at Unknown time  . benztropine (COGENTIN) 0.5 MG tablet Take 1 tablet (0.5 mg total) by mouth at bedtime. For side effects to  medications (Patient not taking: Reported on 05/18/2018) 30 tablet 0 Not Taking at Unknown time  . buPROPion (WELLBUTRIN) 100 MG tablet Take 1 tablet (100 mg total) by mouth daily with breakfast. For mood control (Patient not taking: Reported on 05/18/2018) 30 tablet 0 Not Taking at Unknown time  . divalproex (DEPAKOTE) 500 MG DR tablet Take 1 tablet (500 mg total) by mouth every 12 (twelve) hours. For mood control (Patient not taking: Reported on 05/18/2018) 60 tablet 0 Not Taking at Unknown time  . ferrous sulfate 325 (65 FE) MG tablet Take 1 tablet (325 mg total) by mouth daily with breakfast. For iron level (Patient not taking: Reported on 05/18/2018) 30 tablet 0 Not Taking at Unknown time  . metFORMIN (GLUCOPHAGE) 500 MG tablet Take 1 tablet (500 mg total) by mouth 2 (two) times daily with a meal. For diabetes (Patient not taking: Reported on 05/18/2018) 60 tablet 0 Not Taking at Unknown time  . nystatin (MYCOSTATIN/NYSTOP) powder Apply topically 2 (two) times daily. (Patient not taking: Reported on 05/18/2018) 60 g 2 Not Taking at Unknown time  . terconazole (TERAZOL 3) 0.8 % vaginal cream Place 1 applicator vaginally at bedtime. (Patient not taking: Reported on 05/18/2018) 20 g 0 Not Taking at Unknown time  . tinidazole (TINDAMAX) 500 MG tablet Take 4 tablets (2,000  mg total) by mouth daily with breakfast. (Patient not taking: Reported on 05/18/2018) 12 tablet 0 Not Taking at Unknown time  . traZODone (DESYREL) 150 MG tablet Take 1 tablet (150 mg total) by mouth at bedtime. For mood control and sleep (Patient not taking: Reported on 05/18/2018) 30 tablet 0 Not Taking at Unknown time    Patient Stressors: Financial difficulties Legal issue Occupational concerns  Patient Strengths: Average or above average intelligence Communication skills Motivation for treatment/growth  Treatment Modalities: Medication Management, Group therapy, Case management,  1 to 1 session with clinician,  Psychoeducation, Recreational therapy.   Physician Treatment Plan for Primary Diagnosis: <principal problem not specified> Long Term Goal(s): Improvement in symptoms so as ready for discharge Improvement in symptoms so as ready for discharge   Short Term Goals: Ability to identify changes in lifestyle to reduce recurrence of condition will improve Ability to identify changes in lifestyle to reduce recurrence of condition will improve  Medication Management: Evaluate patient's response, side effects, and tolerance of medication regimen.  Therapeutic Interventions: 1 to 1 sessions, Unit Group sessions and Medication administration.  Evaluation of Outcomes: Progressing  Physician Treatment Plan for Secondary Diagnosis: Active Problems:   Schizoaffective disorder, bipolar type ([place])  Long Term Goal(s): Improvement in symptoms so as ready for discharge Improvement in symptoms so as ready for discharge   Short Term Goals: Ability to identify changes in lifestyle to reduce recurrence of condition will improve Ability to identify changes in lifestyle to reduce recurrence of condition will improve     Medication Management: Evaluate patient's response, side effects, and tolerance of medication regimen.  Therapeutic Interventions: 1 to 1 sessions, Unit Group sessions and Medication administration.  Evaluation of Outcomes: Progressing   RN Treatment Plan for Primary Diagnosis: <principal problem not specified> Long Term Goal(s): Knowledge of disease and therapeutic regimen to maintain health will improve  Short Term Goals: Ability to identify and develop effective coping behaviors will improve and Compliance with prescribed medications will improve  Medication Management: RN will administer medications as ordered by provider, will assess and evaluate patient's response and provide education to patient for prescribed medication. RN will report any adverse and/or side effects to prescribing  provider.  Therapeutic Interventions: 1 on 1 counseling sessions, Psychoeducation, Medication administration, Evaluate responses to treatment, Monitor vital signs and CBGs as ordered, Perform/monitor CIWA, COWS, AIMS and Fall Risk screenings as ordered, Perform wound care treatments as ordered.  Evaluation of Outcomes: Progressing   LCSW Treatment Plan for Primary Diagnosis: <principal problem not specified> Long Term Goal(s): Safe transition to appropriate next level of care at discharge, Engage patient in therapeutic group addressing interpersonal concerns.  Short Term Goals: Engage patient in aftercare planning with referrals and resources, Increase social support and Increase skills for wellness and recovery  Therapeutic Interventions: Assess for all discharge needs, 1 to 1 time with Social worker, Explore available resources and support systems, Assess for adequacy in community support network, Educate family and significant other(s) on suicide prevention, Complete Psychosocial Assessment, Interpersonal group therapy.  Evaluation of Outcomes: Progressing   Progress in Treatment: Attending groups: No. Participating in groups: No. Taking medication as prescribed: Yes. Toleration medication: Yes. Family/Significant other contact made: No, will contact:  mother Patient understands diagnosis: Yes. Discussing patient identified problems/goals with staff: Yes. Medical problems stabilized or resolved: Yes. Denies suicidal/homicidal ideation: Yes. Issues/concerns per patient self-inventory: No. Other: none  New problem(s) identified: No, Describe:  none  New Short Term/Long Term Goal(s):  Patient Goals:  Discharge Plan or Barriers:   Reason for Continuation of Hospitalization: Medication stabilization Other; describe bizarre behavior, catatonia  Estimated Length of Stay: 2-4 days.  Attendees: Patient: 05/20/2018   Physician: Dr Jake Samples, MD 05/20/2018   Nursing:  05/20/2018    RN Care Manager: 05/20/2018   Social Worker: Lurline Idol, LCSW 05/20/2018   Recreational Therapist:  05/20/2018   Other:    Other:  05/20/2018   Other: 05/20/2018        Scribe for Treatment Team: Joanne Chars, [place] 05/20/2018 3:04 PM

## 2018-05-20 NOTE — Progress Notes (Signed)
Recreation Therapy Notes  INPATIENT RECREATION THERAPY ASSESSMENT  Patient Details Name: [name] MRN: [id number] DOB: Mar 09, 1976 Today's Date: 05/20/2018       Information Obtained From: Patient  Able to Participate in Assessment/Interview: Yes  Patient Presentation: Alert  Reason for Admission (Per Patient): Other (Comments)(Pt stated she was neglecting herself)  Patient Stressors: Other ([name])(Pt stated trying to provide for her kids and keeping her car running)  Coping Skills:   Isolation, Journal, Sports, Arguments, Music, Exercise, Meditate, Deep Breathing, Substance Abuse, Impulsivity, Art, Prayer, Avoidance, Read, Hot Bath/Shower  Leisure Interests (2+):  Social - Friends, Social - Family, Individual - Other ([name])([place])  Frequency of Recreation/Participation: Other ([name])(Social with family- Daily; Hang with friends - Weekly)  Awareness of Community Resources:  Yes  Community Resources:  Gym, Other ([name])([employer])  Current Use: (Uses [employer] not the gym)  If no, Barriers?:    Expressed Interest in [place]: No  [place] of Residence:  [place]  Patient Main Form of Transportation: Car  Patient Strengths:  Diplomatic Services operational officer  Patient Identified Areas of Improvement:  Chiropodist to make more money; Tend to kids better  Patient Goal for Hospitalization:  "Get back on track with medication and learn to manage medicine"  Current SI (including self-harm):  No  Current HI:  No  Current AVH: No  Staff Intervention Plan: Group Attendance, Collaborate with Interdisciplinary Treatment Team  Consent to Intern Participation: N/[name]    Victorino Sparrow, LRT/CTRS  [name], [name] 05/20/2018, 1:56 PM

## 2018-05-21 LAB — GLUCOSE, CAPILLARY: Glucose-Capillary: 193 mg/dL — ABNORMAL HIGH (ref 70–99)

## 2018-05-21 MED ORDER — DIVALPROEX SODIUM 500 MG PO DR TAB: 500.0000 mg | DELAYED_RELEASE_TABLET | Freq: Two times a day (BID) | ORAL | 3 refills | Status: DC

## 2018-05-21 MED ORDER — BUPROPION HCL 100 MG PO TABS: 100.0000 mg | ORAL_TABLET | Freq: Every day | ORAL | 2 refills | Status: DC

## 2018-05-21 MED ORDER — ARIPIPRAZOLE ER 400 MG IM SRER: 400.0000 mg | INTRAMUSCULAR | 5 refills | Status: DC

## 2018-05-21 MED ORDER — ARIPIPRAZOLE ER 400 MG IM SRER
400.0000 mg | Freq: Once | INTRAMUSCULAR | Status: AC
  Administered 2018-05-21: 400 mg via INTRAMUSCULAR

## 2018-05-21 MED ORDER — ARIPIPRAZOLE 10 MG PO TABS: 10.0000 mg | ORAL_TABLET | Freq: Every day | ORAL | 2 refills | Status: DC

## 2018-05-21 MED ORDER — LORAZEPAM 1 MG PO TABS: 1.0000 mg | ORAL_TABLET | Freq: Two times a day (BID) | ORAL | 0 refills | Status: AC

## 2018-05-21 MED ORDER — BUPROPION HCL ER (XL) 150 MG PO TB24: 150.0000 mg | ORAL_TABLET | Freq: Every day | ORAL | Status: DC

## 2018-05-21 NOTE — Discharge Summary (Signed)
Physician Discharge Summary Note  Patient:  [name] is an 42 y.o., female  MRN:  [id number]  DOB:  03-Dec-1975  Patient phone:  [phone] (home)   Patient address:   [address] [place] [address],   Total Time spent with patient: Greater than 30 minutes  Date of Admission:  05/18/2018  Date of Discharge: 05/21/18  Reason for Admission: Disorganized state & catatonic features    Principal Problem: Bipolar disorder, curr episode mixed, severe, with psychotic features [facility])  Discharge Diagnoses: Patient Active Problem List   Diagnosis Date Noted  . Bipolar disorder, curr episode mixed, severe, with psychotic features ([place]) [F31.64] 08/07/2016    Priority: High  . Schizoaffective disorder, bipolar type ([place]) [F25.0] 05/18/2018  . Diabetes mellitus ([place]) [E11.9] 08/10/2016  . Cannabis use disorder, moderate, dependence ([place]) [F12.20] 08/07/2016  . CHOLECYSTITIS, UNSPECIFIED [K81.9] 06/12/2008  . BILIARY COLIC [J62.83] 15/17/6160  . CERUMEN IMPACTION, BILATERAL [H61.20] 01/17/2008  . PHARYNGITIS, VIRAL [J02.9] 01/17/2008  . NECK PAIN [M54.2] 01/17/2008   Past Psychiatric History: Bipolar disorder  Past Medical History:  Past Medical History:  Diagnosis Date  . Abnormal Pap smear   . Bipolar 1 disorder ([place])   . Diabetes mellitus without complication ([place])   . Fibroids   . IBS (irritable bowel syndrome)     Past Surgical History:  Procedure Laterality Date  . CERVICAL BIOPSY    . CHOLECYSTECTOMY    . TUBAL LIGATION     Family History:  Family History  Problem Relation Age of Onset  . Diabetes Father   . Diabetes Paternal Grandmother   . Diabetes Maternal Grandmother   . Mental illness Cousin    Family Psychiatric  History: See H&P  Social History:  Social History   Substance and Sexual Activity  Alcohol Use Yes   Comment: occasional     Social History   Substance and Sexual Activity  Drug Use Yes   Comment: CBD oil    Social  History   Socioeconomic History  . Marital status: Married    Spouse name: Not on file  . Number of children: Not on file  . Years of education: Not on file  . Highest education level: Not on file  Occupational History  . Not on file  Social Needs  . Financial resource strain: Not on file  . Food insecurity:    Worry: Not on file    Inability: Not on file  . Transportation needs:    Medical: Not on file    Non-medical: Not on file  Tobacco Use  . Smoking status: Current Every Day Smoker    Packs/day: 0.25    Years: 0.00    Pack years: 0.00    Types: Cigarettes  . Smokeless tobacco: Never Used  Substance and Sexual Activity  . Alcohol use: Yes    Comment: occasional  . Drug use: Yes    Comment: CBD oil  . Sexual activity: Not Currently  Lifestyle  . Physical activity:    Days per week: Not on file    Minutes per session: Not on file  . Stress: Not on file  Relationships  . Social connections:    Talks on phone: Not on file    Gets together: Not on file    Attends religious service: Not on file    Active member of club or organization: Not on file    Attends meetings of clubs or organizations: Not on file    Relationship  status: Not on file  Other Topics Concern  . Not on file  Social History Narrative  . Not on file   Hospital Course: (Per Md's admission evaluation): This is a repeat admission for Ms.[name]. She is 42 years of age and apparently discontinued her bipolar medications in April, which apparently included aripiprazole and Depakote. The patient was noted to be in a disorganized state and catatonic features were noted in the emergency department. When I interview her she states she saw something on her table that may have been a hallucination describing it as a bag she was unaware had previously been there she states she then passed out.  Therefore she is somewhat of an unreliable historian she is anxious she is not fully oriented she is cooperative  though and attempting to answer questions but again is consumed with some degree of anxiety.  She denies current auditory visual loose Nations or thoughts of harming herself or others she states she knows where she is but will not name the facility states she has been here before, states she is not on medication cannot name previous medications. Cannabis is once again found in her urine drug screen this is a chronic issue according to the chart Daughter had called EMS due to these mental status changes apparently patient had been somewhat stable off her medications for period of time.  After the above admission assessment, [name] was started on the medication regimen for her presenting symptoms. She received & was discharged on Wellbutrin 100 mg for depression, Ativan 1 mg prn for anxiety, Abilify 10 mg po daily for mood control, Abilify Maintena 400 mg Q 28 days for mood control, Depakote 500 mg for mood stabilization & Trazodone 150 mg for insomnia. She was enrolled & participated in the group counseling sessions being offered & held on this unit. She learned coping skills. She also received/discharged on other medications for the other medical issues presented. She tolerated her treatment regimen without any adverse effects or reactions reported.   [name] is seen today by the attending psychiatrist for discharge. She says she has normal anxiety about going home. She is not overwhelmed by this. She is looking forward to working on her mental health issues. Not expressing any delusions today. No hallucinations. Feels in control of herself. No fantasy about suicide. No suicidal thoughts. Looking forward to getting back to life. No thoughts of violence. Does not feel depressed. No evidence of mania.   The nursing staff reports that patient has been appropriate on the unit. Patient has been interacting well with peers. No behavioral issues. Patient has not voiced any suicidal thoughts. Patient has not been observed  to be internally stimulated or preoccupied. Patient has been adherent with treatment recommendations. Patient has been tolerating her medications well. No reported adverse effects or reactions.    Patient was discussed at the treatment team meeting this morning. The team members feel that patient is back to her baseline level of function. Team agrees with plan to discharge patient today to continue mental health health care on an outpatient basis as noted below. She left [facility] with all personal belongings in no apparent distress.  Physical Findings: AIMS:  , ,  ,  ,    CIWA:    COWS:     Musculoskeletal: Strength & Muscle Tone: within normal limits Gait & Station: normal Patient leans: N/A  Psychiatric Specialty Exam: Physical Exam  Nursing note and vitals reviewed. Constitutional: She is oriented to person, place, and time.  She appears well-developed and well-nourished.  HENT:  Head: Normocephalic.  Eyes: Pupils are equal, round, and reactive to light.  Cardiovascular: Normal rate.  Respiratory: Effort normal.  GI: Soft.  Musculoskeletal: Normal range of motion.  Neurological: She is alert and oriented to person, place, and time.  Skin: Skin is warm.    Review of Systems  Constitutional: Negative.   HENT: Negative.   Eyes: Negative.   Respiratory: Negative.   Cardiovascular: Negative.   Gastrointestinal: Negative.   Genitourinary: Negative.   Musculoskeletal: Negative.   Skin: Negative.   Neurological: Negative.   Endo/Heme/Allergies: Negative.   Psychiatric/Behavioral: Positive for depression (Stable), hallucinations (Hx. psychosis) and substance abuse (Hx, THC use disorder). Negative for memory loss and suicidal ideas. The patient has insomnia (Stable ). The patient is not nervous/anxious (Stable).     Blood pressure (!) 116/93, pulse 94, temperature 98.5 F (36.9 C), temperature source Oral, resp. rate 20, height 5\' 11"  (1.803 m), weight 117.9 kg, SpO2 100 %.Body mass  index is 36.26 kg/m.  See Md's discharge SRA   Has this patient used any form of tobacco in the last 30 days? (Cigarettes, Smokeless Tobacco, Cigars, and/or Pipes): No  Blood Alcohol level:  Lab Results  Component Value Date   ETH <5 02/24/2017   ETH <5 28/76/8115   Metabolic Disorder Labs:  Lab Results  Component Value Date   HGBA1C 7.5 (H) 02/27/2017   MPG 168.55 02/27/2017   MPG 171 08/06/2016   Lab Results  Component Value Date   PROLACTIN 11.7 05/17/2018   PROLACTIN 105.5 (H) 02/28/2017   Lab Results  Component Value Date   CHOL 148 02/28/2017   TRIG 116 02/28/2017   HDL 36 (L) 02/28/2017   CHOLHDL 4.1 02/28/2017   VLDL 23 02/28/2017   LDLCALC 89 02/28/2017   LDLCALC 106 (H) 08/06/2016    See Psychiatric Specialty Exam and Suicide Risk Assessment completed by Attending Physician prior to discharge.  Discharge destination:  Home  Is patient on multiple antipsychotic therapies at discharge:  No   Has Patient had three or more failed trials of antipsychotic monotherapy by history:  No  Recommended Plan for Multiple Antipsychotic Therapies: NA  Allergies as of 05/21/2018   No Known Allergies     Medication List    STOP taking these medications   benztropine 0.5 MG tablet Commonly known as:  COGENTIN   nystatin powder Commonly known as:  MYCOSTATIN/NYSTOP   tinidazole 500 MG tablet Commonly known as:  TINDAMAX     TAKE these medications     Indication  ARIPiprazole 10 MG tablet Commonly known as:  ABILIFY Take 1 tablet (10 mg total) by mouth daily. For mood control What changed:  when to take this  Indication:  mood stability   ARIPiprazole ER 400 MG Srer injection Commonly known as:  ABILIFY MAINTENA Inject 2 mLs (400 mg total) into the muscle every 28 (twenty-eight) days. For mood control next injection due 04/02/17 What changed:  when to take this  Indication:  mood stability   buPROPion 100 MG tablet Commonly known as:  WELLBUTRIN Take  1 tablet (100 mg total) by mouth daily with breakfast. What changed:  additional instructions  Indication:  Major Depressive Disorder   divalproex 500 MG DR tablet Commonly known as:  DEPAKOTE Take 1 tablet (500 mg total) by mouth 2 (two) times daily. For mood control What changed:  when to take this  Indication:  mood stability   ferrous sulfate 325 (  65 FE) MG tablet Take 1 tablet (325 mg total) by mouth daily with breakfast. For iron level  Indication:  Iron Deficiency   LORazepam 1 MG tablet Commonly known as:  ATIVAN Take 1 tablet (1 mg total) by mouth 2 (two) times daily for 10 days.  Indication:  Anxiousness associated with Depression   metFORMIN 500 MG tablet Commonly known as:  GLUCOPHAGE Take 1 tablet (500 mg total) by mouth 2 (two) times daily with a meal. For diabetes  Indication:  Type 2 Diabetes   terconazole 0.8 % vaginal cream Commonly known as:  TERAZOL 3 Place 1 applicator vaginally at bedtime.  Indication:  Vagina and Vulva Infection due to Candida Species Fungus   traZODone 150 MG tablet Commonly known as:  DESYREL Take 1 tablet (150 mg total) by mouth at bedtime. For mood control and sleep  Indication:  Trouble Sleeping, Major Depressive Disorder      Follow-up Information    [facility]. Go on 05/28/2018.   Why:  Pleaase attend your hospital follow up appointment on Tuesday, 05/28/18 at 9:30a.  Contact information: [address] [place] [address] [phone]          Follow-up recommendations: Activity:  As tolerated Diet: As recommended by your primary care doctor. Keep all scheduled follow-up appointments as recommended.   Comments:  Patient is instructed prior to discharge to: Take all medications as prescribed by his/her mental healthcare provider. Report any adverse effects and or reactions from the medicines to his/her outpatient provider promptly. Patient has been instructed & cautioned: To not engage in alcohol and or illegal drug  use while on prescription medicines. In the event of worsening symptoms, patient is instructed to call the crisis hotline, 911 and or go to the nearest ED for appropriate evaluation and treatment of symptoms. To follow-up with his/her primary care provider for your other medical issues, concerns and or health care needs.   Signed: Lindell Spar, NP 05/21/2018, 12:35 PM

## 2018-05-21 NOTE — Progress Notes (Signed)
Recreation Therapy Notes  INPATIENT RECREATION TR PLAN  Patient Details Name: [name] MRN: [id number] DOB: 06/18/1976 Today's Date: 05/21/2018  Rec Therapy Plan Is patient appropriate for Therapeutic Recreation?: Yes Treatment times per week: about 3 days Estimated Length of Stay: 5-7 days TR Treatment/Interventions: Group participation (Comment)  Discharge Criteria Pt will be discharged from therapy if:: Discharged Treatment plan/goals/alternatives discussed and agreed upon by:: Patient/family  Discharge Summary Short term goals set: See patient care plan Short term goals met: Complete Progress toward goals comments: Groups attended Which groups?: Wellness, Coping skills Reason goals not met: None Therapeutic equipment acquired: N/A Reason patient discharged from therapy: Discharge from hospital Pt/family agrees with progress & goals achieved: Yes Date patient discharged from therapy: 05/21/18      , LRT/CTRS  ,  A 05/21/2018, 11:08 AM  

## 2018-05-21 NOTE — Progress Notes (Signed)
  [facility] Adult Case Management Discharge Plan :  Will you be returning to the same living situation after discharge:  Yes,  own home At discharge, do you have transportation home?: Yes,  friend Do you have the ability to pay for your medications: Yes,  BCBS  Release of information consent forms completed and in the chart;  Patient's signature needed at discharge.  Patient to Follow up at: Follow-up Information    [facility]. Go on 05/28/2018.   Why:  Pleaase attend your hospital follow up appointment on Tuesday, 05/28/18 at 9:30a.  Contact information: [address] [place] [address] [phone]           Next level of care provider has access to [place] and Suicide Prevention discussed: Yes,  with mother     Has patient been referred to the Quitline?: Patient refused referral  Patient has been referred for addiction treatment: Yes  Joanne Chars, [place] 05/21/2018, 9:41 AM

## 2018-05-21 NOTE — Progress Notes (Signed)
D: Pt alert and oriented. Pt denies SI/HI, AVH, and pain at this time. Pt reports they will be able to keep them self safe when they return home.   A: Pt received discharge and medication education/information.   R: Pt verbalized understanding of of discharge and medication education/information.   Pt escorted to front lobby where sister was awaiting to pickup pt.  On self inventory pt reports having slept "good" r/t receiving sleep medication that pt found helpful. Pt reported having a "good" appetite, "normal" energy level, and "fair" concentration. Pt rated depression 2/10, hopelessness 0/10, and anxiety 1/10. Pt report pain in stomach and leg rated at 2/10, however did not verbally report pain at the medication window when asked. Pt goal: learn medication routine and make a better life for children. Accomplish by making scheduled appointments to get children what they need.

## 2018-05-21 NOTE — [facility] Suicide Risk Assessment (Signed)
[facility] Discharge Suicide Risk Assessment   Principal Problem: Exacerbation of underlying bipolar type condition Discharge Diagnoses: Active Problems:   Schizoaffective disorder, bipolar type ([place])   Total Time spent with patient: 30 minutes  Musculoskeletal: Strength & Muscle Tone: within normal limits Gait & Station: normal Patient leans: N/A  Psychiatric Specialty Exam: ROS  Blood pressure (!) 116/93, pulse 94, temperature 98.5 F (36.9 C), temperature source Oral, resp. rate 20, height 5\' 11"  (1.803 m), weight 117.9 kg, SpO2 100 %.Body mass index is 36.26 kg/m.  General Appearance: Casual  Eye Contact::  Good  Speech:  Clear and Coherent409  Volume:  Normal  Mood:  Euthymic  Affect:  Congruent  Thought Process:  Coherent  Orientation:  Full (Time, Place, and Person)  Thought Content:  Logical  Suicidal Thoughts:  No  Homicidal Thoughts:  No  Memory:  Immediate;   Good  Judgement:  Good  Insight:  Good  Psychomotor Activity:  Normal  Concentration:  Good  Recall:  Good  Fund of Knowledge:Good  Language: Good  Akathisia:  Negative  Handed:  Right  AIMS (if indicated):     Assets:  Communication Skills  Sleep:  Number of Hours: 6.25  Cognition: WNL  ADL's:  Intact   Mental Status Per Nursing Assessment::   On Admission:  NA  Demographic Factors:  Unemployed  Loss Factors: Decrease in vocational status  Historical Factors: NA  Risk Reduction Factors:   Employed, Positive therapeutic relationship and Positive coping skills or problem solving skills  Continued Clinical Symptoms:  Bipolar Disorder:   Mixed State  Cognitive Features That Contribute To Risk:  None    Suicide Risk:  Minimal: No identifiable suicidal ideation.  Patients presenting with no risk factors but with morbid ruminations; may be classified as minimal risk based on the severity of the depressive symptoms  Follow-up Information    [facility]. Go on 05/28/2018.   Why:  Pleaase attend  your hospital follow up appointment on Tuesday, 05/28/18 at 9:30a.  Contact information: [address] [place] [address] [phone]           Plan Of Care/Follow-up recommendations:  Activity:  full  Oley Lahaie, MD 05/21/2018, 7:23 AM

## 2018-05-21 NOTE — Progress Notes (Signed)
Recreation Therapy Notes  Date: 11.19.19 Time: 1000 Location: [address] Dayroom  Group Topic: Wellness  Goal Area(s) Addresses:  Patient will define components of whole wellness. Patient will verbalize benefit of whole wellness.  Behavioral Response: Engaged  Intervention: Music, Exercise  Activity: Exercise.  LRT lead group in a series of stretches.  Each patient was then given the opportunity to lead group in an exercise of their choice. Group was to complete at least 30 minutes of exercise.  Patients had to the ability to take breaks when they needed to and get water when needed.  Education: Wellness, Dentist.   Education Outcome: Acknowledges education/In group clarification offered/Needs additional education.   Clinical Observations/Feedback: Pt was bright and engaged with peers.  Pt lead group in two exercises.  Pt completed all the exercises that were presented in group.  Pt was pleasant and active throughout activity.     Victorino Sparrow, LRT/CTRS     Ria Comment, Barb Shear A 05/21/2018 11:09 AM

## 2018-05-21 NOTE — Plan of Care (Signed)
Pt was able to identify coping skills for depression at completion of recreation therapy group sessions.    Victorino Sparrow, LRT/CTRS

## 2019-01-09 ENCOUNTER — Emergency Department (HOSPITAL_COMMUNITY)
Admission: EM | Admit: 2019-01-09 | Discharge: 2019-01-09 | Disposition: A | Discharge status: Home / Self Care | Payer: Medicaid Other | Attending: Emergency Medicine | Admitting: Emergency Medicine

## 2019-01-09 ENCOUNTER — Encounter (HOSPITAL_COMMUNITY): Payer: Self-pay

## 2019-01-09 ENCOUNTER — Other Ambulatory Visit: Payer: Self-pay

## 2019-01-09 DIAGNOSIS — Z5321 Procedure and treatment not carried out due to patient leaving prior to being seen by health care provider: Secondary | ICD-10-CM | POA: Insufficient documentation

## 2019-01-09 DIAGNOSIS — K625 Hemorrhage of anus and rectum: Secondary | ICD-10-CM | POA: Insufficient documentation

## 2019-01-09 HISTORY — DX: Essential (primary) hypertension: I10

## 2019-01-09 NOTE — ED Triage Notes (Signed)
Pt arrived stating that she has had bright red bleeding in her stools that started around 3pm this afternoon. Denies any use of blood thinners. Pt also reports having a yeast infection but is not taking any medication for.

## 2019-01-21 ENCOUNTER — Encounter (HOSPITAL_COMMUNITY): Payer: Self-pay | Admitting: *Deleted

## 2019-01-21 ENCOUNTER — Telehealth: Payer: Self-pay

## 2019-01-21 ENCOUNTER — Other Ambulatory Visit: Payer: Self-pay

## 2019-01-21 ENCOUNTER — Emergency Department (HOSPITAL_COMMUNITY)
Admission: EM | Admit: 2019-01-21 | Discharge: 2019-01-21 | Disposition: A | Discharge status: Home / Self Care | Payer: Medicaid Other | Attending: Emergency Medicine | Admitting: Emergency Medicine

## 2019-01-21 DIAGNOSIS — A599 Trichomoniasis, unspecified: Secondary | ICD-10-CM

## 2019-01-21 DIAGNOSIS — Z79899 Other long term (current) drug therapy: Secondary | ICD-10-CM | POA: Diagnosis not present

## 2019-01-21 DIAGNOSIS — F17210 Nicotine dependence, cigarettes, uncomplicated: Secondary | ICD-10-CM | POA: Diagnosis not present

## 2019-01-21 DIAGNOSIS — I10 Essential (primary) hypertension: Secondary | ICD-10-CM | POA: Insufficient documentation

## 2019-01-21 DIAGNOSIS — R739 Hyperglycemia, unspecified: Secondary | ICD-10-CM

## 2019-01-21 DIAGNOSIS — A5901 Trichomonal vulvovaginitis: Secondary | ICD-10-CM | POA: Diagnosis not present

## 2019-01-21 DIAGNOSIS — E1165 Type 2 diabetes mellitus with hyperglycemia: Secondary | ICD-10-CM | POA: Diagnosis not present

## 2019-01-21 DIAGNOSIS — Z7984 Long term (current) use of oral hypoglycemic drugs: Secondary | ICD-10-CM | POA: Diagnosis not present

## 2019-01-21 DIAGNOSIS — N898 Other specified noninflammatory disorders of vagina: Secondary | ICD-10-CM

## 2019-01-21 LAB — URINALYSIS, ROUTINE W REFLEX MICROSCOPIC
Bilirubin Urine: NEGATIVE
Glucose, UA: >=500 mg/dL — AB
Ketones, ur: 20 mg/dL — AB
Nitrite: NEGATIVE
Protein, ur: 30 mg/dL — AB
Specific Gravity, Urine: 1.036 — ABNORMAL HIGH (ref 1.005–1.030)
pH: 6 (ref 5.0–8.0)

## 2019-01-21 LAB — BASIC METABOLIC PANEL
Anion gap: 15 (ref 5–15)
BUN: 6 mg/dL (ref 6–20)
CO2: 20 mmol/L — ABNORMAL LOW (ref 22–32)
Calcium: 9.8 mg/dL (ref 8.9–10.3)
Chloride: 96 mmol/L — ABNORMAL LOW (ref 98–111)
Creatinine, Ser: 0.83 mg/dL (ref 0.44–1.00)
GFR calc Af Amer: >60 mL/min (ref >60)
GFR calc non Af Amer: >60 mL/min (ref >60)
Glucose, Bld: 435 mg/dL — ABNORMAL HIGH (ref 70–99)
Potassium: 3.9 mmol/L (ref 3.5–5.1)
Sodium: 131 mmol/L — ABNORMAL LOW (ref 135–145)

## 2019-01-21 LAB — WET PREP, GENITAL
Clue Cells Wet Prep HPF POC: NONE SEEN
Sperm: NONE SEEN
Yeast Wet Prep HPF POC: NONE SEEN

## 2019-01-21 LAB — PREGNANCY, URINE: Preg Test, Ur: NEGATIVE

## 2019-01-21 MED ORDER — ONDANSETRON 4 MG PO TBDP
4.0000 mg | ORAL_TABLET | Freq: Once | ORAL | Status: AC
  Administered 2019-01-21: 4 mg via ORAL
  Filled 2019-01-21: qty 1

## 2019-01-21 MED ORDER — LIDOCAINE HCL (PF) 1 % IJ SOLN
INTRAMUSCULAR | Status: AC
  Administered 2019-01-21: 5 mL
  Filled 2019-01-21: qty 5

## 2019-01-21 MED ORDER — METFORMIN HCL 500 MG PO TABS: 500.0000 mg | ORAL_TABLET | Freq: Two times a day (BID) | ORAL | 0 refills | Status: DC

## 2019-01-21 MED ORDER — CEFTRIAXONE SODIUM 250 MG IJ SOLR
250.0000 mg | Freq: Once | INTRAMUSCULAR | Status: AC
  Administered 2019-01-21: 250 mg via INTRAMUSCULAR
  Filled 2019-01-21: qty 250

## 2019-01-21 MED ORDER — METRONIDAZOLE 500 MG PO TABS
2000.0000 mg | ORAL_TABLET | Freq: Once | ORAL | Status: AC
  Administered 2019-01-21: 2000 mg via ORAL
  Filled 2019-01-21: qty 4

## 2019-01-21 MED ORDER — AZITHROMYCIN 250 MG PO TABS
1000.0000 mg | ORAL_TABLET | Freq: Once | ORAL | Status: AC
  Administered 2019-01-21: 1000 mg via ORAL
  Filled 2019-01-21: qty 4

## 2019-01-21 MED ORDER — SODIUM CHLORIDE 0.9 % IV BOLUS: 1000.0000 mL | Freq: Once | INTRAVENOUS | Status: DC

## 2019-01-21 NOTE — Discharge Instructions (Signed)
You were treated for gonorrhea, chlamydia, and trichomonas today.  Your partner needs to get tested and treated as well.  Please start your metformin, 500 mg twice a day.  After 1 week, increase to 1000 mg in the morning and 500 mg at night.  By this time, we should hopefully have you and with primary care to be able to titrate this medication further.  Use a condom with every sexual encounter Follow up with your doctor OBGYN in regards to today's visit.   We are scheduling you an appointment for [facility] community health and wellness.  We will try to reach out to you this afternoon for a phone call. Please return to the ER for worsening symptoms, high fevers or persistent vomiting.  You have been tested for HIV, syphilis, chlamydia and gonorrhea. These results will be available in approximately 3 days. You will be notified if they are positive.   It is very important to practice safe sex and use condoms when sexually active. If your results are positive you need to notify all sexual partners so they can be treated as well. The website https://dean.info/ can be used to send anonymous text messages or emails to alert sexual contacts.   SEEK IMMEDIATE MEDICAL CARE IF:  You develop an oral temperature above 101 F  not controlled by medications or lasting more than 2 days.  You develop an increase in pain.  You develop vaginal bleeding and it is not time for your period.  You develop painful intercourse.  Please return for any nausea or vomiting, feeling generally unwell, dizziness or lightheadedness.

## 2019-01-21 NOTE — Progress Notes (Signed)
[facility] ED TOC CM -referral no PCP/no Insurance  Contacted [facility] and appt scheduled for 02/14/2019 at 9:30 am. Left pt a HIPAA compliant message requesting a call back. Jonnie Finner RN CCM Case Mgmt phone [phone]

## 2019-01-21 NOTE — ED Notes (Signed)
Patient verbalizes understanding of discharge instructions . Opportunity for questions and answers were provided . Armband removed by staff ,Pt discharged from ED. W/C  offered at D/C  and Declined W/C at D/C and was escorted to lobby by RN.  

## 2019-01-21 NOTE — Telephone Encounter (Signed)
Returned call, pt stated that she was at the hospital.

## 2019-01-21 NOTE — ED Triage Notes (Signed)
Pt reports vag DC for 4 weeks that is yellow and causes b urning. Pt has used OTC for yeast infection  With out relief.

## 2019-01-21 NOTE — ED Provider Notes (Signed)
[facility] EMERGENCY DEPARTMENT Provider Note   CSN: [id number] Arrival date & time: 01/21/19  1103     History   Chief Complaint Chief Complaint  Patient presents with  . Vaginal Discharge    HPI [name] is a 43 y.o. female.     HPI   Patient is a 43 yo female with a PMH of T2 DM, bipolar 1 disorder, IBS, abnormal pap smear presenting for vaginal irritation and vaginal discharge.  Patient reports that this is been going on for approximately a month.  She reports that she has a history of recurrent yeast infections due to her diabetes.  She reports she thinks that this is a yeast infection.  She denies any pelvic pain or cramping.  Denies nausea, vomiting or abdominal pain.  She denies any dysuria, urgency, or frequency but does report that urine is irritating to the vaginal region.  Denies any vaginal bleeding.  Patient reports that she is sexually active with one female partner, monogamous for the past 6 years.  She denies concerned about STI.  Patient reports that she stopped taking her metformin approximately year ago due to concerns that it was harming her.  She has not had a PCP in approximately this amount of time.  Past Medical History:  Diagnosis Date  . Abnormal Pap smear   . Bipolar 1 disorder ([place])   . Diabetes mellitus without complication ([place])   . Fibroids   . Hypertension   . IBS (irritable bowel syndrome)     Patient Active Problem List   Diagnosis Date Noted  . Schizoaffective disorder, bipolar type ([place]) 05/18/2018  . Diabetes mellitus ([place]) 08/10/2016  . Bipolar disorder, curr episode mixed, severe, with psychotic features ([place]) 08/07/2016  . Cannabis use disorder, moderate, dependence ([place]) 08/07/2016  . CHOLECYSTITIS, UNSPECIFIED 06/12/2008  . BILIARY COLIC 24/23/5361  . CERUMEN IMPACTION, BILATERAL 01/17/2008  . PHARYNGITIS, VIRAL 01/17/2008  . NECK PAIN 01/17/2008    Past Surgical History:  Procedure Laterality Date  .  CERVICAL BIOPSY    . CHOLECYSTECTOMY    . TUBAL LIGATION       OB History    Gravida  7   Para  2   Term  2   Preterm      AB  5   Living  2     SAB      TAB  4   Ectopic  1   Multiple      Live Births               Home Medications    Prior to Admission medications   Medication Sig Start Date End Date Taking? Authorizing Provider  ARIPiprazole (ABILIFY) 10 MG tablet Take 1 tablet (10 mg total) by mouth daily. For mood control 05/21/18   Johnn Hai, MD  ARIPiprazole ER (ABILIFY MAINTENA) 400 MG SRER injection Inject 2 mLs (400 mg total) into the muscle every 28 (twenty-eight) days. For mood control next injection due 04/02/17 05/21/18   Johnn Hai, MD  buPROPion [facility]) 100 MG tablet Take 1 tablet (100 mg total) by mouth daily with breakfast. 05/21/18   Johnn Hai, MD  divalproex (DEPAKOTE) 500 MG DR tablet Take 1 tablet (500 mg total) by mouth 2 (two) times daily. For mood control 05/21/18   Johnn Hai, MD  ferrous sulfate 325 (65 FE) MG tablet Take 1 tablet (325 mg total) by mouth daily with breakfast. For iron level Patient not taking: Reported on  05/18/2018 03/06/17   Money, Lowry Ram, FNP  metFORMIN (GLUCOPHAGE) 500 MG tablet Take 1 tablet (500 mg total) by mouth 2 (two) times daily with a meal. For diabetes Patient not taking: Reported on 05/18/2018 03/05/17   Money, Lowry Ram, FNP  terconazole (TERAZOL 3) 0.8 % vaginal cream Place 1 applicator vaginally at bedtime. Patient not taking: Reported on 05/18/2018 12/27/17   Morene Crocker, CNM  traZODone (DESYREL) 150 MG tablet Take 1 tablet (150 mg total) by mouth at bedtime. For mood control and sleep Patient not taking: Reported on 05/18/2018 03/05/17   Money, Lowry Ram, FNP    Family History Family History  Problem Relation Age of Onset  . Diabetes Father   . Diabetes Paternal Grandmother   . Diabetes Maternal Grandmother   . Mental illness Cousin     Social History Social History   Tobacco  Use  . Smoking status: Current Every Day Smoker    Packs/day: 0.25    Years: 0.00    Pack years: 0.00    Types: Cigarettes  . Smokeless tobacco: Never Used  Substance Use Topics  . Alcohol use: Yes    Comment: occasional  . Drug use: Yes    Comment: CBD oil     Allergies   Patient has no known allergies.   Review of Systems Review of Systems  Constitutional: Negative for chills and fever.  HENT: Negative for congestion and sore throat.   Eyes: Negative for visual disturbance.  Respiratory: Negative for cough, chest tightness and shortness of breath.   Cardiovascular: Negative for chest pain.  Gastrointestinal: Negative for abdominal pain, constipation, diarrhea, nausea and vomiting.  Genitourinary: Positive for vaginal discharge and vaginal pain. Negative for dysuria, flank pain and pelvic pain.  Musculoskeletal: Negative for back pain and myalgias.  Skin: Negative for rash.  Neurological: Negative for dizziness, syncope and light-headedness.     Physical Exam Updated Vital Signs Ht 5\' 11"  (1.803 m)   LMP 12/16/2018   BMI 36.26 kg/m   Physical Exam Vitals signs and nursing note reviewed.  Constitutional:      General: She is not in acute distress.    Appearance: She is well-developed.  HENT:     Head: Normocephalic and atraumatic.     Mouth/Throat:     Mouth: Mucous membranes are moist.  Eyes:     Conjunctiva/sclera: Conjunctivae normal.     Pupils: Pupils are equal, round, and reactive to light.  Neck:     Musculoskeletal: Normal range of motion and neck supple.  Cardiovascular:     Rate and Rhythm: Normal rate and regular rhythm.     Heart sounds: S1 normal and S2 normal. No murmur.  Pulmonary:     Effort: Pulmonary effort is normal.     Breath sounds: Normal breath sounds. No wheezing or rales.  Abdominal:     General: There is no distension.     Palpations: Abdomen is soft.     Tenderness: There is no abdominal tenderness. There is no guarding.   Genitourinary:    Comments: Pelvic examination performed with nurse tech chaperone present.  There is excoriation and erythema of the labia majora and minora.  Vaginal tissue erythematous.  Cervix erythematous but nonfriable.  There is copious amount of yellow and thin discharge within the vaginal vault. Musculoskeletal: Normal range of motion.        General: No deformity.  Skin:    General: Skin is warm and dry.  Findings: No erythema or rash.  Neurological:     Mental Status: She is alert.     Comments: Cranial nerves grossly intact. Patient moves extremities symmetrically and with good coordination.  Psychiatric:        Behavior: Behavior normal.        Thought Content: Thought content normal.        Judgment: Judgment normal.      ED Treatments / Results  Labs (all labs ordered are listed, but only abnormal results are displayed) Labs Reviewed  WET PREP, GENITAL - Abnormal; Notable for the following components:      Result Value   Trich, Wet Prep PRESENT (*)    WBC, Wet Prep HPF POC MANY (*)    All other components within normal limits  URINALYSIS, ROUTINE W REFLEX MICROSCOPIC - Abnormal; Notable for the following components:   APPearance CLOUDY (*)    Specific Gravity, Urine 1.036 (*)    Glucose, UA >=500 (*)    Hgb urine dipstick SMALL (*)    Ketones, ur 20 (*)    Protein, ur 30 (*)    Leukocytes,Ua LARGE (*)    All other components within normal limits  BASIC METABOLIC PANEL - Abnormal; Notable for the following components:   Sodium 131 (*)    Chloride 96 (*)    CO2 20 (*)    Glucose, Bld 435 (*)    All other components within normal limits  PREGNANCY, URINE  RPR  HIV ANTIBODY (ROUTINE TESTING W REFLEX)  GC/CHLAMYDIA PROBE AMP ([place]) NOT AT [facility]    EKG None  Radiology No results found.  Procedures Procedures (including critical care time)  Medications Ordered in ED Medications  sodium chloride 0.9 % bolus 1,000 mL (1,000 mLs Intravenous  Not Given 01/21/19 1430)  cefTRIAXone (ROCEPHIN) injection 250 mg (250 mg Intramuscular Given 01/21/19 1432)  azithromycin (ZITHROMAX) tablet 1,000 mg (1,000 mg Oral Given 01/21/19 1432)  metroNIDAZOLE (FLAGYL) tablet 2,000 mg (2,000 mg Oral Given 01/21/19 1432)  ondansetron (ZOFRAN-ODT) disintegrating tablet 4 mg (4 mg Oral Given 01/21/19 1432)  lidocaine (PF) (XYLOCAINE) 1 % injection (5 mLs  Given 01/21/19 1433)     Initial Impression / Assessment and Plan / ED Course  I have reviewed the triage vital signs and the nursing notes.  Pertinent labs & imaging results that were available during my care of the patient were reviewed by me and considered in my medical decision making (see chart for details).  Clinical Course as of Jan 20 1437  Tue Jan 21, 2019  1340 Will hydrate with IV fluids.   Glucose(!): 435 [AM]  1340 No anion gap.  Anion gap: 15 [AM]  1435 Patient reports she cannot stay any longer for IV fluids.  She reports that she has to leave and take her daughter to work.  She is not in DKA today.  Discussed return precautions for signs and symptoms of significantly elevated blood sugar and DKA.  Will prescribe metformin.   [AM]    Clinical Course User Index [AM] Albesa Seen, PA-C       This is a well-appearing 43 year old female with past medical history of type 2 diabetes mellitus, bipolar disorder presenting for vaginal discharge and vaginal irritation.  Examination consistent with STI versus yeast infection.  We will also check a BMP to restart patient on her metformin given hyperglycemia here in emergency department.  Will also give IV fluids to lower the sugar.  Pregnancy test negative. Wet  prep demonstrating many WBCs and trichomonas.  No yeast.Lab work demonstrating urinalysis with large leukocytes and greater than 500Glucose.  She does have some proteinuria.  BMP showing glucose of 435 and sodium of 131 likely reactive to hyperglycemia.  No anion gap.  Recommended to  patient that she receive IV fluids for hydration and lowering glucose however she reports she cannot stay any longer because she needs to pick up her daughter.  Renal function normal so restarted patient on metformin.  She does not have any signs or symptoms of DKA today however I did give her return precautions for any nausea, vomiting, generalized malaise, polyuria, polydipsia, weakness or lightheadedness suggestive of DKA.  Case management consulted and she obtained an appointment for the patient in follow-up within the next week to get her diabetes further managed.  Patient was treated for gonorrhea, chlamydia, trichomonas here in the emergency department.  She was instructed that she will receive a call for any positive results in 2 to 3 days.  She was instructed that partners need to be notified and tested/treated .Patient is in understanding and agrees with the plan of care.  Final Clinical Impressions(s) / ED Diagnoses   Final diagnoses:  Trichomonas infection  Vaginal discharge  Hyperglycemia    ED Discharge Orders         Ordered    metFORMIN (GLUCOPHAGE) 500 MG tablet  2 times daily with meals     01/21/19 1443           Albesa Seen, PA-C 01/21/19 1643    Lajean Saver, MD 01/21/19 1656

## 2019-01-22 LAB — HIV ANTIBODY (ROUTINE TESTING W REFLEX): HIV Screen 4th Generation wRfx: NONREACTIVE

## 2019-01-22 LAB — GC/CHLAMYDIA PROBE AMP (~~LOC~~) NOT AT ARMC
Chlamydia: NEGATIVE
Neisseria Gonorrhea: NEGATIVE

## 2019-01-22 LAB — RPR: RPR Ser Ql: NONREACTIVE

## 2019-02-14 ENCOUNTER — Inpatient Hospital Stay: Payer: Self-pay | Admitting: Family Medicine

## 2019-02-27 ENCOUNTER — Other Ambulatory Visit: Payer: Self-pay

## 2019-02-27 ENCOUNTER — Encounter (HOSPITAL_COMMUNITY): Payer: Self-pay | Admitting: Emergency Medicine

## 2019-02-27 ENCOUNTER — Emergency Department (HOSPITAL_COMMUNITY)
Admission: EM | Admit: 2019-02-27 | Discharge: 2019-02-27 | Disposition: A | Discharge status: Home / Self Care | Payer: Medicaid Other | Attending: Emergency Medicine | Admitting: Emergency Medicine

## 2019-02-27 ENCOUNTER — Emergency Department (HOSPITAL_COMMUNITY): Payer: Medicaid Other

## 2019-02-27 DIAGNOSIS — R197 Diarrhea, unspecified: Secondary | ICD-10-CM | POA: Insufficient documentation

## 2019-02-27 DIAGNOSIS — Z7984 Long term (current) use of oral hypoglycemic drugs: Secondary | ICD-10-CM | POA: Diagnosis not present

## 2019-02-27 DIAGNOSIS — Z79899 Other long term (current) drug therapy: Secondary | ICD-10-CM | POA: Diagnosis not present

## 2019-02-27 DIAGNOSIS — R1084 Generalized abdominal pain: Secondary | ICD-10-CM | POA: Insufficient documentation

## 2019-02-27 DIAGNOSIS — F17210 Nicotine dependence, cigarettes, uncomplicated: Secondary | ICD-10-CM | POA: Diagnosis not present

## 2019-02-27 DIAGNOSIS — I10 Essential (primary) hypertension: Secondary | ICD-10-CM | POA: Diagnosis not present

## 2019-02-27 DIAGNOSIS — R112 Nausea with vomiting, unspecified: Secondary | ICD-10-CM | POA: Diagnosis not present

## 2019-02-27 DIAGNOSIS — E119 Type 2 diabetes mellitus without complications: Secondary | ICD-10-CM | POA: Insufficient documentation

## 2019-02-27 LAB — COMPREHENSIVE METABOLIC PANEL
ALT: 14 U/L (ref 0–44)
AST: 21 U/L (ref 15–41)
Albumin: 4.1 g/dL (ref 3.5–5.0)
Alkaline Phosphatase: 78 U/L (ref 38–126)
Anion gap: 12 (ref 5–15)
BUN: 9 mg/dL (ref 6–20)
CO2: 21 mmol/L — ABNORMAL LOW (ref 22–32)
Calcium: 9.6 mg/dL (ref 8.9–10.3)
Chloride: 104 mmol/L (ref 98–111)
Creatinine, Ser: 0.79 mg/dL (ref 0.44–1.00)
GFR calc Af Amer: >60 mL/min (ref >60)
GFR calc non Af Amer: >60 mL/min (ref >60)
Glucose, Bld: 154 mg/dL — ABNORMAL HIGH (ref 70–99)
Potassium: 4 mmol/L (ref 3.5–5.1)
Sodium: 137 mmol/L (ref 135–145)
Total Bilirubin: 0.8 mg/dL (ref 0.3–1.2)
Total Protein: 8.5 g/dL — ABNORMAL HIGH (ref 6.5–8.1)

## 2019-02-27 LAB — CBC
HCT: 44.5 % (ref 36.0–46.0)
Hemoglobin: 13.7 g/dL (ref 12.0–15.0)
MCH: 25.6 pg — ABNORMAL LOW (ref 26.0–34.0)
MCHC: 30.8 g/dL (ref 30.0–36.0)
MCV: 83 fL (ref 80.0–100.0)
Platelets: 280 10*3/uL (ref 150–400)
RBC: 5.36 MIL/uL — ABNORMAL HIGH (ref 3.87–5.11)
RDW: 15.5 % (ref 11.5–15.5)
WBC: 11.9 10*3/uL — ABNORMAL HIGH (ref 4.0–10.5)
nRBC: 0 % (ref 0.0–0.2)

## 2019-02-27 LAB — LIPASE, BLOOD: Lipase: 33 U/L (ref 11–51)

## 2019-02-27 LAB — I-STAT BETA HCG BLOOD, ED (MC, WL, AP ONLY): I-stat hCG, quantitative: <5 m[IU]/mL (ref <5)

## 2019-02-27 MED ORDER — ONDANSETRON 4 MG PO TBDP: 4.0000 mg | ORAL_TABLET | Freq: Three times a day (TID) | ORAL | 0 refills | Status: DC | PRN

## 2019-02-27 MED ORDER — ONDANSETRON 4 MG PO TBDP: 4.0000 mg | ORAL_TABLET | Freq: Once | ORAL | Status: DC | PRN

## 2019-02-27 MED ORDER — SODIUM CHLORIDE 0.9 % IV BOLUS
1000.0000 mL | Freq: Once | INTRAVENOUS | Status: AC
  Administered 2019-02-27: 1000 mL via INTRAVENOUS

## 2019-02-27 MED ORDER — SODIUM CHLORIDE (PF) 0.9 % IJ SOLN
INTRAMUSCULAR | Status: AC
  Filled 2019-02-27: qty 50

## 2019-02-27 MED ORDER — IOHEXOL 300 MG/ML  SOLN
100.0000 mL | Freq: Once | INTRAMUSCULAR | Status: AC | PRN
  Administered 2019-02-27: 100 mL via INTRAVENOUS

## 2019-02-27 MED ORDER — SODIUM CHLORIDE 0.9% FLUSH: 3.0000 mL | Freq: Once | INTRAVENOUS | Status: DC

## 2019-02-27 MED ORDER — ONDANSETRON HCL 4 MG/2ML IJ SOLN
4.0000 mg | Freq: Once | INTRAMUSCULAR | Status: AC
  Administered 2019-02-27: 4 mg via INTRAVENOUS
  Filled 2019-02-27: qty 2

## 2019-02-27 MED ORDER — METOCLOPRAMIDE HCL 5 MG/ML IJ SOLN
10.0000 mg | Freq: Once | INTRAMUSCULAR | Status: AC
  Administered 2019-02-27: 10 mg via INTRAVENOUS
  Filled 2019-02-27: qty 2

## 2019-02-27 NOTE — ED Notes (Signed)
Pt d/c home per MD order. Discharge summary reviewed. Pt off unit via WC. Pt does not have shoes.  Sandals and hospital scrubs provided to be able to ride bus.

## 2019-02-27 NOTE — Discharge Instructions (Addendum)
You were evaluated in the Emergency Department and after careful evaluation, we did not find any emergent condition requiring admission or further testing in the hospital.  Your exam/testing today was overall reassuring.  Please use the nausea medicine at home as needed.  Please return to the Emergency Department if you experience any worsening of your condition.  We encourage you to follow up with a primary care provider.  Thank you for allowing [place] to be a part of your care.

## 2019-02-27 NOTE — ED Notes (Signed)
Patient transported to CT 

## 2019-02-27 NOTE — ED Provider Notes (Signed)
  Provider Note MRN:  [id number]  Arrival date & time: 02/27/19    ED Course and Medical Decision Making  Assumed care from Dr. Waverly Ferrari at shift change.  N/V in the setting of ETOH, has been belligerent.  CT is pending.  10 AM update: Work-up is unrevealing, CT scan is without acute findings.  Upon my repeat evaluation patient is ambulating in the room, appears well, normal vital signs, appropriate for discharge with symptomatic management at home.  Final Clinical Impressions(s) / ED Diagnoses     ICD-10-CM   1. Generalized abdominal pain  R10.84   2. Nausea vomiting and diarrhea  R11.2    R19.7     ED Discharge Orders         Ordered    ondansetron (ZOFRAN ODT) 4 MG disintegrating tablet  Every 8 hours PRN     02/27/19 1008            Discharge Instructions     You were evaluated in the Emergency Department and after careful evaluation, we did not find any emergent condition requiring admission or further testing in the hospital.  Your exam/testing today was overall reassuring.  Please use the nausea medicine at home as needed.  Please return to the Emergency Department if you experience any worsening of your condition.  We encourage you to follow up with a primary care provider.  Thank you for allowing [place] to be a part of your care.      Barth Kirks. [place]onia Small, [place] [email or web address]    Maudie Flakes, MD 02/27/19 1010

## 2019-02-27 NOTE — ED Provider Notes (Signed)
[facility] DEPT Provider Note   CSN: [id number] Arrival date & time: 02/27/19  0118     History   Chief Complaint Chief Complaint  Patient presents with   Emesis    HPI [name] is a 43 y.o. female.     Patient presents to the emergency department for evaluation of nausea, vomiting and diarrhea.  Patient reports that symptoms began yesterday.  She is experiencing diffuse abdominal distention and pain.  She has not had any fever or upper respiratory symptoms.     Past Medical History:  Diagnosis Date   Abnormal Pap smear    Bipolar 1 disorder (H[facility])    Diabetes mellitus without complication ([facility])    Fibroids    Hypertension    IBS (irritable bowel syndrome)     Patient Active Problem List   Diagnosis Date Noted   Schizoaffective disorder, bipolar type ([place]) 05/18/2018   Diabetes mellitus ([place]) 08/10/2016   Bipolar disorder, curr episode mixed, severe, with psychotic features ([place]) 08/07/2016   Cannabis use disorder, moderate, dependence ([place]) 08/07/2016   CHOLECYSTITIS, UNSPECIFIED [id number]   BILIARY COLIC [id number]   CERUMEN IMPACTION, BILATERAL 01/17/2008   PHARYNGITIS, VIRAL 01/17/2008   NECK PAIN 01/17/2008    Past Surgical History:  Procedure Laterality Date   CERVICAL BIOPSY     CHOLECYSTECTOMY     TUBAL LIGATION       OB History    Gravida  7   Para  2   Term  2   Preterm      AB  5   Living  2     SAB      TAB  4   Ectopic  1   Multiple      Live Births               Home Medications    Prior to Admission medications   Medication Sig Start Date End Date Taking? Authorizing Provider  ARIPiprazole (ABILIFY) 10 MG tablet Take 1 tablet (10 mg total) by mouth daily. For mood control 05/21/18   Johnn Hai, MD  ARIPiprazole ER (ABILIFY MAINTENA) 400 MG SRER injection Inject 2 mLs (400 mg total) into the muscle every 28 (twenty-eight) days. For mood control next injection  due 04/02/17 05/21/18   Johnn Hai, MD  buPROPion [facility]) 100 MG tablet Take 1 tablet (100 mg total) by mouth daily with breakfast. 05/21/18   Johnn Hai, MD  divalproex (DEPAKOTE) 500 MG DR tablet Take 1 tablet (500 mg total) by mouth 2 (two) times daily. For mood control 05/21/18   Johnn Hai, MD  ferrous sulfate 325 (65 FE) MG tablet Take 1 tablet (325 mg total) by mouth daily with breakfast. For iron level Patient not taking: Reported on 05/18/2018 03/06/17   Money, Lowry Ram, FNP  metFORMIN (GLUCOPHAGE) 500 MG tablet Take 1 tablet (500 mg total) by mouth 2 (two) times daily with a meal. For diabetes 01/21/19   Langston Masker B, PA-C  terconazole (TERAZOL 3) 0.8 % vaginal cream Place 1 applicator vaginally at bedtime. Patient not taking: Reported on 05/18/2018 12/27/17   Morene Crocker, CNM  traZODone (DESYREL) 150 MG tablet Take 1 tablet (150 mg total) by mouth at bedtime. For mood control and sleep Patient not taking: Reported on 05/18/2018 03/05/17   Money, Lowry Ram, FNP    Family History Family History  Problem Relation Age of Onset   Diabetes Father    Diabetes Paternal Grandmother  Diabetes Maternal Grandmother    Mental illness Cousin     Social History Social History   Tobacco Use   Smoking status: Current Every Day Smoker    Packs/day: 0.25    Years: 0.00    Pack years: 0.00    Types: Cigarettes   Smokeless tobacco: Never Used  Substance Use Topics   Alcohol use: Yes    Comment: occasional   Drug use: Yes    Comment: CBD oil     Allergies   Patient has no known allergies.   Review of Systems Review of Systems  Gastrointestinal: Positive for abdominal pain, diarrhea, nausea and vomiting.  All other systems reviewed and are negative.    Physical Exam Updated Vital Signs BP (!) 168/100 (BP Location: Right Arm)    Pulse 66    Temp 98.2 F (36.8 C) (Oral)    Resp 18    Ht 5\' 10"  (1.778 m)    Wt 90.7 kg    SpO2 100%    BMI 28.70 kg/m    Physical Exam Vitals signs and nursing note reviewed.  Constitutional:      General: She is not in acute distress.    Appearance: Normal appearance. She is well-developed.  HENT:     Head: Normocephalic and atraumatic.     Right Ear: Hearing normal.     Left Ear: Hearing normal.     Nose: Nose normal.  Eyes:     Conjunctiva/sclera: Conjunctivae normal.     Pupils: Pupils are equal, round, and reactive to light.  Neck:     Musculoskeletal: Normal range of motion and neck supple.  Cardiovascular:     Rate and Rhythm: Regular rhythm.     Heart sounds: S1 normal and S2 normal. No murmur. No friction rub. No gallop.   Pulmonary:     Effort: Pulmonary effort is normal. No respiratory distress.     Breath sounds: Normal breath sounds.  Chest:     Chest wall: No tenderness.  Abdominal:     General: Bowel sounds are normal. There is distension.     Palpations: Abdomen is soft.     Tenderness: There is abdominal tenderness. There is no guarding or rebound. Negative signs include Murphy's sign and McBurney's sign.     Hernia: No hernia is present.  Musculoskeletal: Normal range of motion.  Skin:    General: Skin is warm and dry.     Findings: No rash.  Neurological:     Mental Status: She is alert and oriented to person, place, and time.     GCS: GCS eye subscore is 4. GCS verbal subscore is 5. GCS motor subscore is 6.     Cranial Nerves: No cranial nerve deficit.     Sensory: No sensory deficit.     Coordination: Coordination normal.  Psychiatric:        Speech: Speech normal.        Behavior: Behavior normal.        Thought Content: Thought content normal.      ED Treatments / Results  Labs (all labs ordered are listed, but only abnormal results are displayed) Labs Reviewed  COMPREHENSIVE METABOLIC PANEL - Abnormal; Notable for the following components:      Result Value   CO2 21 (*)    Glucose, Bld 154 (*)    Total Protein 8.5 (*)    All other components within  normal limits  CBC - Abnormal; Notable for the following components:  WBC 11.9 (*)    RBC 5.36 (*)    MCH 25.6 (*)    All other components within normal limits  LIPASE, BLOOD  URINALYSIS, ROUTINE W REFLEX MICROSCOPIC  I-STAT BETA HCG BLOOD, ED ([facility], [facility], [facility] ONLY)    EKG None  Radiology No results found.  Procedures Procedures (including critical care time)  Medications Ordered in ED Medications  sodium chloride flush (NS) 0.9 % injection 3 mL (has no administration in time range)  ondansetron (ZOFRAN-ODT) disintegrating tablet 4 mg (has no administration in time range)  sodium chloride 0.9 % bolus 1,000 mL (1,000 mLs Intravenous New Bag/Given 02/27/19 0747)  ondansetron (ZOFRAN) injection 4 mg (4 mg Intravenous Given 02/27/19 0748)  metoCLOPramide (REGLAN) injection 10 mg (10 mg Intravenous Given 02/27/19 0748)     Initial Impression / Assessment and Plan / ED Course  I have reviewed the triage vital signs and the nursing notes.  Pertinent labs & imaging results that were available during my care of the patient were reviewed by me and considered in my medical decision making (see chart for details).        Patient presents to the emergency department for evaluation of acute onset nausea, vomiting, diarrhea with abdominal discomfort.  Examination revealed some mild distention and diffuse tenderness.  No obvious signs of acute surgical process on exam and lab work was fairly unrevealing.  Will undergo CT abdomen and pelvis to further evaluate.  Will sign out to oncoming ER physician to follow-up on CT scan.  If negative anticipate discharge.  Final Clinical Impressions(s) / ED Diagnoses   Final diagnoses:  Generalized abdominal pain  Nausea vomiting and diarrhea    ED Discharge Orders    None       Orpah Greek, MD 02/27/19 [phone]

## 2019-02-27 NOTE — ED Notes (Signed)
Pt laying on floor in lobby and urinated on herself

## 2019-02-27 NOTE — ED Triage Notes (Signed)
Patient came in by [facility]. Patient is complaining of nausea and vomiting after drinking wine tonight. Patient is uncooperative.

## 2019-03-21 ENCOUNTER — Ambulatory Visit
Admission: EM | Admit: 2019-03-21 | Discharge: 2019-03-21 | Disposition: A | Discharge status: Home / Self Care | Payer: Medicaid Other | Attending: Physician Assistant | Admitting: Physician Assistant

## 2019-03-21 ENCOUNTER — Other Ambulatory Visit: Payer: Self-pay

## 2019-03-21 DIAGNOSIS — Z760 Encounter for issue of repeat prescription: Secondary | ICD-10-CM

## 2019-03-21 LAB — POCT FASTING CBG KUC MANUAL ENTRY: POCT Glucose (KUC): 536 mg/dL — AB (ref 70–99)

## 2019-03-21 MED ORDER — METFORMIN HCL 500 MG PO TABS: 500.0000 mg | ORAL_TABLET | Freq: Two times a day (BID) | ORAL | 0 refills | Status: DC

## 2019-03-21 NOTE — ED Provider Notes (Signed)
[facility]    CSN: [id number] Arrival date & time: 03/21/19  1252      History   Chief Complaint Chief Complaint  Patient presents with  . Medication Refill    HPI [name] is a 43 y.o. female.   43 year old female comes in for medication refill.  She has a history of diabetes, unsure if it is controlled.  She states she takes metformin 500 mg twice a day, and was prescribed by the ED.  She has been trying to follow-up with primary care, but has not been able to get an appointment.  She ran out of medication last week.  She denies abdominal pain, nausea, vomiting, diarrhea.  Denies urinary frequency, urgency, dysuria.  Denies polydipsia.  Denies weakness, dizziness, syncope.  She states she has glucose monitoring at home, but has not been checking her blood sugar.     Past Medical History:  Diagnosis Date  . Abnormal Pap smear   . Bipolar 1 disorder ([place])   . Diabetes mellitus without complication ([place])   . Fibroids   . Hypertension   . IBS (irritable bowel syndrome)     Patient Active Problem List   Diagnosis Date Noted  . Schizoaffective disorder, bipolar type ([place]) 05/18/2018  . Diabetes mellitus ([place]) 08/10/2016  . Bipolar disorder, curr episode mixed, severe, with psychotic features ([place]) 08/07/2016  . Cannabis use disorder, moderate, dependence ([place]) 08/07/2016  . CHOLECYSTITIS, UNSPECIFIED 06/12/2008  . BILIARY COLIC 99991111  . CERUMEN IMPACTION, BILATERAL 01/17/2008  . PHARYNGITIS, VIRAL 01/17/2008  . NECK PAIN 01/17/2008    Past Surgical History:  Procedure Laterality Date  . CERVICAL BIOPSY    . CHOLECYSTECTOMY    . TUBAL LIGATION      OB History    Gravida  7   Para  2   Term  2   Preterm      AB  5   Living  2     SAB      TAB  4   Ectopic  1   Multiple      Live Births               Home Medications    Prior to Admission medications   Medication Sig Start Date End Date Taking? Authorizing Provider   ARIPiprazole (ABILIFY) 10 MG tablet Take 1 tablet (10 mg total) by mouth daily. For mood control 05/21/18   Johnn Hai, MD  ARIPiprazole ER (ABILIFY MAINTENA) 400 MG SRER injection Inject 2 mLs (400 mg total) into the muscle every 28 (twenty-eight) days. For mood control next injection due 04/02/17 05/21/18   Johnn Hai, MD  divalproex (DEPAKOTE) 500 MG DR tablet Take 1 tablet (500 mg total) by mouth 2 (two) times daily. For mood control 05/21/18   Johnn Hai, MD  ferrous sulfate 325 (65 FE) MG tablet Take 1 tablet (325 mg total) by mouth daily with breakfast. For iron level Patient not taking: Reported on 05/18/2018 03/06/17   Money, Lowry Ram, FNP  metFORMIN (GLUCOPHAGE) 500 MG tablet Take 1 tablet (500 mg total) by mouth 2 (two) times daily with a meal. For diabetes 03/21/19 04/20/19  Ok Edwards, PA-C  ondansetron (ZOFRAN ODT) 4 MG disintegrating tablet Take 1 tablet (4 mg total) by mouth every 8 (eight) hours as needed for nausea or vomiting. 02/27/19   Maudie Flakes, MD  terconazole (TERAZOL 3) 0.8 % vaginal cream Place 1 applicator vaginally at bedtime. Patient not taking: Reported  on 05/18/2018 12/27/17   Morene Crocker, CNM  traZODone (DESYREL) 150 MG tablet Take 1 tablet (150 mg total) by mouth at bedtime. For mood control and sleep Patient not taking: Reported on 05/18/2018 03/05/17   Money, Lowry Ram, FNP    Family History Family History  Problem Relation Age of Onset  . Diabetes Father   . Diabetes Paternal Grandmother   . Diabetes Maternal Grandmother   . Mental illness Cousin     Social History Social History   Tobacco Use  . Smoking status: Current Every Day Smoker    Packs/day: 0.25    Years: 0.00    Pack years: 0.00    Types: Cigarettes  . Smokeless tobacco: Never Used  Substance Use Topics  . Alcohol use: Yes    Comment: occasional  . Drug use: Yes    Comment: CBD oil     Allergies   Patient has no known allergies.   Review of Systems Review of Systems   Reason unable to perform ROS: See HPI as above.     Physical Exam Triage Vital Signs ED Triage Vitals [03/21/19 1318]  Enc Vitals Group     BP 139/90     Pulse Rate 89     Resp 20     Temp 98.7 F (37.1 C)     Temp Source Oral     SpO2 98 %     Weight      Height      Head Circumference      Peak Flow      Pain Score 0     Pain Loc      Pain Edu?      Excl. in [place]?    No data found.  Updated Vital Signs BP 139/90 (BP Location: Left Arm)   Pulse 89   Temp 98.7 F (37.1 C) (Oral)   Resp 20   LMP 02/20/2019   SpO2 98%   Physical Exam Constitutional:      General: She is not in acute distress.    Appearance: Normal appearance. She is well-developed. She is not ill-appearing, toxic-appearing or diaphoretic.  HENT:     Head: Normocephalic and atraumatic.     Mouth/Throat:     Mouth: Mucous membranes are moist.  Eyes:     Conjunctiva/sclera: Conjunctivae normal.     Pupils: Pupils are equal, round, and reactive to light.  Neck:     Musculoskeletal: Normal range of motion and neck supple.  Pulmonary:     Effort: Pulmonary effort is normal. No respiratory distress.  Skin:    General: Skin is warm and dry.     Capillary Refill: Capillary refill takes less than 2 seconds.  Neurological:     Mental Status: She is alert and oriented to person, place, and time.    UC Treatments / Results  Labs (all labs ordered are listed, but only abnormal results are displayed) Labs Reviewed  POCT FASTING CBG [place] - Abnormal; Notable for the following components:      Result Value   POCT Glucose ([facility]) 536 (*)    All other components within normal limits    Lab Results  Component Value Date   HGBA1C 7.5 (H) 02/27/2017     EKG   Radiology No results found.  Procedures Procedures (including critical care time)  Medications Ordered in UC Medications - No data to display  Initial Impression / Assessment and Plan / UC Course  I have  reviewed the triage  vital signs and the nursing notes.  Pertinent labs & imaging results that were available during my care of the patient were reviewed by me and considered in my medical decision making (see chart for details).    Chart review indicates patient had appointment with [place] [facility] on 02/14/2019, however, patient was not aware of this appointment.  Scheduled patient for primary care at [facility] 04/08/2019.  Will refill metformin for 1 month.  Patient hyperglycemic this visit, but asymptomatic.  She had cereal prior to arrival.  Will have patient restart metformin, discussed decreasing carbohydrates, soda intake.  Discussed keeping a log of CBG twice a day.  Return precautions given.  Otherwise follow-up with PCP as scheduled for further evaluation and management needed.   does notFinal Clinical Impressions(s) / UC Diagnoses   Final diagnoses:  Medication refill   ED Prescriptions    Medication Sig Dispense Auth. Provider   metFORMIN (GLUCOPHAGE) 500 MG tablet  (Status: Discontinued) Take 1 tablet (500 mg total) by mouth 2 (two) times daily with a meal. For diabetes 60 tablet Ura Yingling V, PA-C   metFORMIN (GLUCOPHAGE) 500 MG tablet Take 1 tablet (500 mg total) by mouth 2 (two) times daily with a meal. For diabetes 60 tablet Ok Edwards, PA-C     PDMP not reviewed this encounter.   Ok Edwards, PA-C 03/21/19 1359

## 2019-03-21 NOTE — ED Triage Notes (Signed)
Pt requesting a refill on her metformin 500mg  BID. States unable to see her PCP and told to come here

## 2019-03-21 NOTE — Discharge Instructions (Signed)
Restart metformin as directed. Check blood glucose 2 times a day and document for PCP. Follow up as scheduled.

## 2019-04-07 ENCOUNTER — Telehealth: Payer: Self-pay

## 2019-04-07 NOTE — Telephone Encounter (Signed)
Called patient to do their pre-visit COVID screening.  Call went to voicemail. Unable to do prescreening.  

## 2019-04-08 ENCOUNTER — Other Ambulatory Visit: Payer: Self-pay

## 2019-04-08 ENCOUNTER — Encounter: Payer: Self-pay | Admitting: Internal Medicine

## 2019-04-08 ENCOUNTER — Ambulatory Visit (INDEPENDENT_AMBULATORY_CARE_PROVIDER_SITE_OTHER): Payer: Medicaid Other | Admitting: Internal Medicine

## 2019-04-08 VITALS — BP 136/83 | HR 82 | Temp 97.2°F | Resp 17 | Ht 72.0 in | Wt 289.8 lb

## 2019-04-08 DIAGNOSIS — E1142 Type 2 diabetes mellitus with diabetic polyneuropathy: Secondary | ICD-10-CM | POA: Diagnosis not present

## 2019-04-08 DIAGNOSIS — F17210 Nicotine dependence, cigarettes, uncomplicated: Secondary | ICD-10-CM

## 2019-04-08 DIAGNOSIS — Z720 Tobacco use: Secondary | ICD-10-CM | POA: Insufficient documentation

## 2019-04-08 DIAGNOSIS — I10 Essential (primary) hypertension: Secondary | ICD-10-CM | POA: Diagnosis not present

## 2019-04-08 DIAGNOSIS — Z2821 Immunization not carried out because of patient refusal: Secondary | ICD-10-CM

## 2019-04-08 DIAGNOSIS — Z6839 Body mass index (BMI) 39.0-39.9, adult: Secondary | ICD-10-CM

## 2019-04-08 DIAGNOSIS — K1370 Unspecified lesions of oral mucosa: Secondary | ICD-10-CM

## 2019-04-08 DIAGNOSIS — F17200 Nicotine dependence, unspecified, uncomplicated: Secondary | ICD-10-CM

## 2019-04-08 LAB — GLUCOSE, POCT (MANUAL RESULT ENTRY): POC Glucose: 364 mg/dl — AB (ref 70–99)

## 2019-04-08 MED ORDER — LISINOPRIL 5 MG PO TABS: 5.0000 mg | ORAL_TABLET | Freq: Every day | ORAL | 4 refills | Status: DC

## 2019-04-08 MED ORDER — NICOTINE 10 MG IN INHA: 1.0000 | RESPIRATORY_TRACT | 1 refills | Status: DC | PRN

## 2019-04-08 MED ORDER — METFORMIN HCL 1000 MG PO TABS: 1000.0000 mg | ORAL_TABLET | Freq: Two times a day (BID) | ORAL | 4 refills | Status: DC

## 2019-04-08 NOTE — Progress Notes (Signed)
Patient is not fasting. Had coffee with cream. CBG is 364.  Doesn't have a meter to check blood sugars. Denies nausea, vomiting & polydipsia.  Has c/o numbness/tingling in her feet & polyuria.  Declines flu shot.

## 2019-04-08 NOTE — Patient Instructions (Signed)
Increase metformin to 1000 mg twice a day. I have referred you for your diabetic eye exam. I have referred you to see a nutritionist for dietary counseling.  Start lisinopril 5 mg daily for blood pressure.  I have sent prescription to the pharmacy for the Nicotrol inhaler to help with smoking cessation.   Diabetes Mellitus and Standards of Medical Care Managing diabetes (diabetes mellitus) can be complicated. Your diabetes treatment may be managed by a team of health care providers, including:  A physician who specializes in diabetes (endocrinologist).  A nurse practitioner or physician assistant.  Nurses.  A diet and nutrition specialist (registered dietitian).  A certified diabetes educator (CDE).  An exercise specialist.  A pharmacist.  An eye doctor.  A foot specialist (podiatrist).  A dentist.  A primary care provider.  A mental health provider. Your health care providers follow guidelines to help you get the best quality of care. The following schedule is a general guideline for your diabetes management plan. Your health care providers may give you more specific instructions. Physical exams Upon being diagnosed with diabetes mellitus, and each year after that, your health care provider will ask about your medical and family history. He or she will also do a physical exam. Your exam may include:  Measuring your height, weight, and body mass index (BMI).  Checking your blood pressure. This will be done at every routine medical visit. Your target blood pressure may vary depending on your medical conditions, 43, and other factors.  Thyroid gland exam.  Skin exam.  Screening for damage to your nerves (peripheral neuropathy). This may include checking the pulse in your legs and feet and checking the level of sensation in your hands and feet.  A complete foot exam to inspect the structure and skin of your feet, including checking for cuts, bruises, redness,  blisters, sores, or other problems.  Screening for blood vessel (vascular) problems, which may include checking the pulse in your legs and feet and checking your temperature. Blood tests Depending on your treatment plan and your personal needs, you may have the following tests done:  HbA1c (hemoglobin A1c). This test provides information about blood sugar (glucose) control over the previous 2-3 months. It is used to adjust your treatment plan, if needed. This test will be done: ? At least 2 times a year, if you are meeting your treatment goals. ? 4 times a year, if you are not meeting your treatment goals or if treatment goals have changed.  Lipid testing, including total, LDL, and HDL cholesterol and triglyceride levels. ? The goal for LDL is less than 100 mg/dL (5.5 mmol/L). If you are at high risk for complications, the goal is less than 70 mg/dL (3.9 mmol/L). ? The goal for HDL is 40 mg/dL (2.2 mmol/L) or higher for men and 50 mg/dL (2.8 mmol/L) or higher for women. An HDL cholesterol of 60 mg/dL (3.3 mmol/L) or higher gives some protection against heart disease. ? The goal for triglycerides is less than 150 mg/dL (8.3 mmol/L).  Liver function tests.  Kidney function tests.  Thyroid function tests. Dental and eye exams  Visit your dentist two times a year.  If you have type 1 diabetes, your health care provider may recommend an eye exam 3-5 years after you are diagnosed, and then once a year after your first exam. ? For children with type 1 diabetes, a health care provider may recommend an eye exam when your child is age 41 or older  and has had diabetes for 3-5 years. After the first exam, your child should get an eye exam once a year.  If you have type 2 diabetes, your health care provider may recommend an eye exam as soon as you are diagnosed, and then once a year after your first exam. Immunizations   The yearly flu (influenza) vaccine is recommended for everyone 6 months or  older who has diabetes.  The pneumonia (pneumococcal) vaccine is recommended for everyone 41 years or older who has diabetes. If you are 41 or older, you may get the pneumonia vaccine as a series of two separate shots.  The hepatitis B vaccine is recommended for adults shortly after being diagnosed with diabetes.  Adults and children with diabetes should receive all other vaccines according to age-specific recommendations from the Centers for Disease Control and Prevention (CDC). Mental and emotional health Screening for symptoms of eating disorders, anxiety, and depression is recommended at the time of diagnosis and afterward as needed. If your screening shows that you have symptoms (positive screening result), you may need more evaluation and you may work with a mental health care provider. Treatment plan Your treatment plan will be reviewed at every medical visit. You and your health care provider will discuss:  How you are taking your medicines, including insulin.  Any side effects you are experiencing.  Your blood glucose target goals.  The frequency of your blood glucose monitoring.  Lifestyle habits, such as activity level as well as tobacco, alcohol, and substance use. Diabetes self-management education Your health care provider will assess how well you are monitoring your blood glucose levels and whether you are taking your insulin correctly. He or she may refer you to:  A certified diabetes educator to manage your diabetes throughout your life, starting at diagnosis.  A registered dietitian who can create or review your personal nutrition plan.  An exercise specialist who can discuss your activity level and exercise plan. Summary  Managing diabetes (diabetes mellitus) can be complicated. Your diabetes treatment may be managed by a team of health care providers.  Your health care providers follow guidelines in order to help you get the best quality of care.  Standards of  care including having regular physical exams, blood tests, blood pressure monitoring, immunizations, screening tests, and education about how to manage your diabetes.  Your health care providers may also give you more specific instructions based on your individual health. This information is not intended to replace advice given to you by your health care provider. Make sure you discuss any questions you have with your health care provider. Document Released: 04/16/2009 Document Revised: 03/08/2018 Document Reviewed: 03/17/2016 Elsevier Patient Education  2020 [employer].

## 2019-04-08 NOTE — Progress Notes (Signed)
Patient ID: [name], female    DOB: Jul 26, 1975  MRN: [id number]  CC: Establish Care and Diabetes   Subjective: [name] is a 43 y.o. female who presents for establish care at [facility] Her concerns today include:  Patient with history of DM type II, bipolar, schizoaffective disorder, tob dep  Previous PCP was at [facility].  Decided to change because she was not satisfied with her care.    Patient confirms history of bipolar disorder and schizoaffective disorder.  Mental Health at [facility].  Seen once a mth.  Last visit was less than 1 wk ago. On abilify and Hydroxyzine.  Suppose to be on Wellbutrin but not taking.  DIABETES TYPE 2/Obesity Dx 2 yrs ago Last A1C:   Results for orders placed or performed in visit on 04/08/19  Glucose (CBG)  Result Value Ref Range   POC Glucose 364 (A) 70 - 99 mg/dl    Med Adherence:  [x]  Yes - on Metformin   []  No Medication side effects:  []  Yes    [x]  No Home Monitoring?  []  Yes    [x]  No Home glucose results range:  Diet Adherence: []  Yes    [x]  No "I do bad because I eat sugar and carbs."  Never saw nutritionist in past.  Drinks sweet tea and coffee with flavored creamer. Exercise: [x]  Yes    []  No Hypoglycemic episodes?: []  Yes    []  No Numbness of the feet? [x]  Yes    []  No Retinopathy hx? []  Yes    []  No Last eye exam: never had one.  + blurred vision Comments:  +Frequent urination and polydipsia.  No urinary incontinence.  However she feels she needs something to slow down the frequent urination  Tob dep: smoke 1 pk/day for 3 yrs. Quit Oct 2019 for 6 mths by using patches.  Patches causes rash for her.  Restarted smoking during [place] pandemic.    BP noted to be elevated today.  In looking through her chart I see that blood pressure has been elevated on other visits to ED.Marland Kitchen  Never dx  HM:  Last pap was last yr by GYN.  Past medical, social, surgical and family history reviewed and updated  Patient Active Problem List    Diagnosis Date Noted  . Schizoaffective disorder, bipolar type ([place]) 05/18/2018  . Diabetes mellitus ([place]) 08/10/2016  . Bipolar disorder, curr episode mixed, severe, with psychotic features ([place]) 08/07/2016  . Cannabis use disorder, moderate, dependence ([place]) 08/07/2016  . CHOLECYSTITIS, UNSPECIFIED 06/12/2008  . BILIARY COLIC [id number]  . CERUMEN IMPACTION, BILATERAL 01/17/2008  . PHARYNGITIS, VIRAL 01/17/2008  . NECK PAIN 01/17/2008     Current Outpatient Medications on File Prior to Visit  Medication Sig Dispense Refill  . ARIPiprazole ER (ABILIFY MAINTENA) 400 MG SRER injection Inject 400 mg into the muscle every 28 (twenty-eight) days.    . hydrOXYzine (ATARAX/VISTARIL) 10 MG tablet Take 10 mg by mouth at bedtime as needed and may repeat dose one time if needed.     No current facility-administered medications on file prior to visit.     No Known Allergies  Social History   Socioeconomic History  . Marital status: Married    Spouse name: Not on file  . Number of children: 2  . Years of education: Not on file  . Highest education level: Master's degree (e.g., MA, MS, MEng, MEd, MSW, MBA)  Occupational History  . Not on file  Social Needs  .  Financial resource strain: Not on file  . Food insecurity    Worry: Not on file    Inability: Not on file  . Transportation needs    Medical: Not on file    Non-medical: Not on file  Tobacco Use  . Smoking status: Current Every Day Smoker    Packs/day: 1.00    Years: 3.00    Pack years: 3.00    Types: Cigarettes  . Smokeless tobacco: Never Used  Substance and Sexual Activity  . Alcohol use: Yes    Comment: occasional  . Drug use: Yes    Types: Marijuana    Comment: CBD gummies  . Sexual activity: Not Currently  Lifestyle  . Physical activity    Days per week: Not on file    Minutes per session: Not on file  . Stress: Not on file  Relationships  . Social Herbalist on phone: Not on file    Gets  together: Not on file    Attends religious service: Not on file    Active member of club or organization: Not on file    Attends meetings of clubs or organizations: Not on file    Relationship status: Not on file  . Intimate partner violence    Fear of current or ex partner: Not on file    Emotionally abused: Not on file    Physically abused: Not on file    Forced sexual activity: Not on file  Other Topics Concern  . Not on file  Social History Narrative  . Not on file    Family History  Problem Relation Age of Onset  . Diabetes Father   . Diabetes Paternal Grandmother   . Diabetes Maternal Grandmother   . Mental illness Cousin     Past Surgical History:  Procedure Laterality Date  . CERVICAL BIOPSY    . CHOLECYSTECTOMY    . TUBAL LIGATION      ROS: Review of Systems  HENT:       Complains of having a bump on the mucous membrane of the right cheek.  She states she has had it for years.  It has not increased in size she has seen a dentist about it.  He gave her some cream to use but it has not decreased the size of it.  She wants to know if there is a medication that I can give her to take it away   Negative except as stated above  PHYSICAL EXAM: BP 136/83   Pulse 82   Temp (!) 97.2 F (36.2 C) (Temporal)   Resp 17   Ht 6' (1.829 m)   Wt 289 lb 12.8 oz (131.5 kg)   LMP 03/18/2019   SpO2 98%   BMI 39.30 kg/m   Physical Exam  General appearance - alert, well appearing, obese middle-age African-American female and in no distress Mental status - normal mood, behavior, speech, dress, motor activity, and thought processes Eyes - pupils equal and reactive, extraocular eye movements intact Nose - normal and patent, no erythema, discharge or polyps Mouth -raised hornlike lesion noted on the inner cheek right side.  Otherwise throat is clear without erythema or exudates Neck - supple, no significant adenopathy Chest - clear to auscultation, no wheezes, rales or rhonchi,  symmetric air entry Heart - normal rate, regular rhythm, normal S1, S2, no murmurs, rubs, clicks or gallops Extremities - peripheral pulses normal, no pedal edema, no clubbing or cyanosis Diabetic Foot Exam -  Simple   Simple Foot Form Visual Inspection No deformities, no ulcerations, no other skin breakdown bilaterally: Yes Sensation Testing Intact to touch and monofilament testing bilaterally: Yes Pulse Check Posterior Tibialis and Dorsalis pulse intact bilaterally: Yes Comments      CMP Latest Ref Rng & Units 02/27/2019 01/21/2019 05/17/2018  Glucose 70 - 99 mg/dL 154(H) 435(H) 300(H)  BUN 6 - 20 mg/dL 9 6 9   Creatinine 0.44 - 1.00 mg/dL 0.79 0.83 0.76  Sodium 135 - 145 mmol/L 137 131(L) 140  Potassium 3.5 - 5.1 mmol/L 4.0 3.9 3.0(L)  Chloride 98 - 111 mmol/L 104 96(L) 107  CO2 22 - 32 mmol/L 21(L) 20(L) 19(L)  Calcium 8.9 - 10.3 mg/dL 9.6 9.8 8.7(L)  Total Protein 6.5 - 8.1 g/dL 8.5(H) - 6.7  Total Bilirubin 0.3 - 1.2 mg/dL 0.8 - 1.5(H)  Alkaline Phos 38 - 126 U/L 78 - 76  AST 15 - 41 U/L 21 - 29  ALT 0 - 44 U/L 14 - 41   Lipid Panel     Component Value Date/Time   CHOL 148 02/28/2017 0619   TRIG 116 02/28/2017 0619   HDL 36 (L) 02/28/2017 0619   CHOLHDL 4.1 02/28/2017 0619   VLDL 23 02/28/2017 0619   LDLCALC 89 02/28/2017 0619    CBC    Component Value Date/Time   WBC 11.9 (H) 02/27/2019 0129   RBC 5.36 (H) 02/27/2019 0129   HGB 13.7 02/27/2019 0129   HCT 44.5 02/27/2019 0129   PLT 280 02/27/2019 0129   MCV 83.0 02/27/2019 0129   MCH 25.6 (L) 02/27/2019 0129   MCHC 30.8 02/27/2019 0129   RDW 15.5 02/27/2019 0129   LYMPHSABS 1.3 05/17/2018 0948   MONOABS 0.5 05/17/2018 0948   EOSABS 0.1 05/17/2018 0948   BASOSABS 0.1 05/17/2018 0948    ASSESSMENT AND PLAN: 1. Type 2 diabetes mellitus with diabetic polyneuropathy, without long-term current use of insulin (HCC) Blood sugar today is over 300.  Symptoms suggest that her diabetes is not controlled.  I  recommend increasing metformin to 1 g twice a day.  Also recommend starting Victoza which will help with weight loss.  Patient wants to hold off on this for now to see what her A1c looks like first. Healthy eating habits discussed.  Agreeable to seeing a nutritionist. Encourage some form of moderate intensity exercise at least 150 minutes total per week. - Glucose (CBG) - Microalbumin/Creatinine Ratio, Urine - Hemoglobin A1c - CBC - Lipid panel - Ambulatory referral to Ophthalmology - Amb ref to Medical Nutrition Therapy-MNT - Comprehensive metabolic panel - metFORMIN (GLUCOPHAGE) 1000 MG tablet; Take 1 tablet (1,000 mg total) by mouth 2 (two) times daily with a meal. For diabetes  Dispense: 60 tablet; Refill: 4  2. Essential hypertension DASH diet discussed and encouraged.  Start lisinopril - lisinopril (ZESTRIL) 5 MG tablet; Take 1 tablet (5 mg total) by mouth daily.  Dispense: 30 tablet; Refill: 4  3. Class 2 severe obesity due to excess calories with serious comorbidity and body mass index (BMI) of 39.0 to 39.9 in adult [facility]) See #1 above - Amb ref to Medical Nutrition Therapy-MNT  4. Tobacco dependence Patient advised to quit smoking. Discussed health risks associated with smoking including lung and other types of cancers, chronic lung diseases and CV risks.. Pt ready to give trail of quitting.  Discussed methods to help quit including quitting cold [place], use of NRT, Chantix and Bupropion.  She is wanting to try the Nicotrol  inhaler.  Prescription written.  Less than 5 minutes spent on counseling  - nicotine (NICOTROL) 10 MG inhaler; Inhale 1 Cartridge (1 continuous puffing total) into the lungs as needed for smoking cessation.  Dispense: 42 each; Refill: 1  5. Influenza vaccination declined  6. 23-polyvalent pneumococcal polysaccharide vaccine declined   8. Oral mucosal lesion - Ambulatory referral to Oral Maxillofacial Surgery    Patient was given the opportunity to ask  questions.  Patient verbalized understanding of the plan and was able to repeat key elements of the plan.   Orders Placed This Encounter  Procedures  . Microalbumin/Creatinine Ratio, Urine  . Hemoglobin A1c  . CBC  . Lipid panel  . Comprehensive metabolic panel  . Ambulatory referral to Ophthalmology  . Amb ref to Medical Nutrition Therapy-MNT  . Ambulatory referral to Oral Maxillofacial Surgery  . Glucose (CBG)     Requested Prescriptions   Signed Prescriptions Disp Refills  . metFORMIN (GLUCOPHAGE) 1000 MG tablet 60 tablet 4    Sig: Take 1 tablet (1,000 mg total) by mouth 2 (two) times daily with a meal. For diabetes  . lisinopril (ZESTRIL) 5 MG tablet 30 tablet 4    Sig: Take 1 tablet (5 mg total) by mouth daily.  . nicotine (NICOTROL) 10 MG inhaler 42 each 1    Sig: Inhale 1 Cartridge (1 continuous puffing total) into the lungs as needed for smoking cessation.    Return in about 2 months (around 06/08/2019).  Karle Plumber, MD, FACP

## 2019-04-09 ENCOUNTER — Telehealth: Payer: Self-pay | Admitting: Internal Medicine

## 2019-04-09 DIAGNOSIS — E1142 Type 2 diabetes mellitus with diabetic polyneuropathy: Secondary | ICD-10-CM

## 2019-04-09 LAB — COMPREHENSIVE METABOLIC PANEL
ALT: 32 IU/L (ref 0–32)
AST: 18 IU/L (ref 0–40)
Albumin/Globulin Ratio: 1.6 (ref 1.2–2.2)
Albumin: 4.1 g/dL (ref 3.8–4.8)
Alkaline Phosphatase: 123 IU/L — ABNORMAL HIGH (ref 39–117)
BUN/Creatinine Ratio: 11 (ref 9–23)
BUN: 9 mg/dL (ref 6–24)
Bilirubin Total: <0.2 mg/dL (ref 0.0–1.2)
CO2: 22 mmol/L (ref 20–29)
Calcium: 9.9 mg/dL (ref 8.7–10.2)
Chloride: 95 mmol/L — ABNORMAL LOW (ref 96–106)
Creatinine, Ser: 0.81 mg/dL (ref 0.57–1.00)
GFR calc Af Amer: 104 mL/min/{1.73_m2} (ref >59)
GFR calc non Af Amer: 90 mL/min/{1.73_m2} (ref >59)
Globulin, Total: 2.5 g/dL (ref 1.5–4.5)
Glucose: 434 mg/dL — ABNORMAL HIGH (ref 65–99)
Potassium: 4.9 mmol/L (ref 3.5–5.2)
Sodium: 134 mmol/L (ref 134–144)
Total Protein: 6.6 g/dL (ref 6.0–8.5)

## 2019-04-09 LAB — CBC
Hematocrit: 39.1 % (ref 34.0–46.6)
Hemoglobin: 12.1 g/dL (ref 11.1–15.9)
MCH: 25.6 pg — ABNORMAL LOW (ref 26.6–33.0)
MCHC: 30.9 g/dL — ABNORMAL LOW (ref 31.5–35.7)
MCV: 83 fL (ref 79–97)
Platelets: 377 10*3/uL (ref 150–450)
RBC: 4.72 x10E6/uL (ref 3.77–5.28)
RDW: 15.4 % (ref 11.7–15.4)
WBC: 9.2 10*3/uL (ref 3.4–10.8)

## 2019-04-09 LAB — HEMOGLOBIN A1C
Est. average glucose Bld gHb Est-mCnc: 355 mg/dL
Hgb A1c MFr Bld: 14 % — ABNORMAL HIGH (ref 4.8–5.6)

## 2019-04-09 LAB — LIPID PANEL
Chol/HDL Ratio: 5.1 ratio — ABNORMAL HIGH (ref 0.0–4.4)
Cholesterol, Total: 240 mg/dL — ABNORMAL HIGH (ref 100–199)
HDL: 47 mg/dL (ref >39)
LDL Chol Calc (NIH): 160 mg/dL — ABNORMAL HIGH (ref 0–99)
Triglycerides: 178 mg/dL — ABNORMAL HIGH (ref 0–149)
VLDL Cholesterol Cal: 33 mg/dL (ref 5–40)

## 2019-04-09 LAB — MICROALBUMIN / CREATININE URINE RATIO
Creatinine, Urine: 66.9 mg/dL
Microalb/Creat Ratio: 16 mg/g creat (ref 0–29)
Microalbumin, Urine: 10.9 ug/mL

## 2019-04-09 MED ORDER — TRUE METRIX METER W/DEVICE KIT: PACK | 0 refills | Status: DC

## 2019-04-09 MED ORDER — LANTUS SOLOSTAR 100 UNIT/ML ~~LOC~~ SOPN: 14.0000 [IU] | PEN_INJECTOR | Freq: Every day | SUBCUTANEOUS | 99 refills | Status: DC

## 2019-04-09 MED ORDER — ATORVASTATIN CALCIUM 20 MG PO TABS: 20.0000 mg | ORAL_TABLET | Freq: Every day | ORAL | 3 refills | Status: DC

## 2019-04-09 MED ORDER — TRUE METRIX BLOOD GLUCOSE TEST VI STRP: ORAL_STRIP | 12 refills | Status: DC

## 2019-04-09 MED ORDER — TRUEPLUS LANCETS 28G MISC: 4 refills | Status: DC

## 2019-04-09 MED ORDER — PEN NEEDLES 31G X 8 MM MISC: 6 refills | Status: DC

## 2019-04-09 NOTE — Telephone Encounter (Signed)
PC placed to pt today.  VMM left informing her that LDL cholesterol elevated.  I recommend starting med called Atorvastatin to lower.  Rxn sent to her pharmacy.   A1C 14 confirming DM not controlled.  I recommend starting daily evening dose of insulin called Lantus 14 units.  Rxn sent to her pharmacy for the Lantus and DM testing supplies.  Will have my CMA schedule appt for her to see our clinical pharmacist to be taught insulin injection and use of meter.

## 2019-04-11 NOTE — Telephone Encounter (Signed)
Left voice mail to call back 

## 2019-04-14 ENCOUNTER — Ambulatory Visit: Payer: Medicaid Other | Admitting: Pharmacist

## 2019-04-14 NOTE — Telephone Encounter (Signed)
Left voice mail to call back 

## 2019-04-17 ENCOUNTER — Other Ambulatory Visit: Payer: Self-pay | Admitting: Pharmacist

## 2019-04-17 DIAGNOSIS — E1142 Type 2 diabetes mellitus with diabetic polyneuropathy: Secondary | ICD-10-CM

## 2019-04-17 MED ORDER — PEN NEEDLES 31G X 8 MM MISC: 6 refills | Status: DC

## 2019-05-20 ENCOUNTER — Ambulatory Visit: Payer: Medicaid Other | Admitting: Registered"

## 2019-06-10 ENCOUNTER — Ambulatory Visit: Payer: Medicaid Other

## 2019-10-08 ENCOUNTER — Other Ambulatory Visit: Payer: Self-pay | Admitting: Internal Medicine

## 2019-10-08 DIAGNOSIS — E1142 Type 2 diabetes mellitus with diabetic polyneuropathy: Secondary | ICD-10-CM

## 2019-10-13 ENCOUNTER — Telehealth: Payer: Self-pay | Admitting: Family Medicine

## 2019-10-13 DIAGNOSIS — E1142 Type 2 diabetes mellitus with diabetic polyneuropathy: Secondary | ICD-10-CM

## 2019-10-13 MED ORDER — METFORMIN HCL 1000 MG PO TABS: 1000.0000 mg | ORAL_TABLET | Freq: Two times a day (BID) | ORAL | 0 refills | Status: DC

## 2019-10-13 NOTE — Telephone Encounter (Signed)
Short Rx sent to pharmacy. Patient hasn't been seen in 6 months. Needs follow up visit prior to receiving refills.

## 2019-10-13 NOTE — Telephone Encounter (Signed)
1) Medication(s) Requested (by name): metFORMIN (GLUCOPHAGE) 1000 MG tablet [id number]   2) Pharmacy of Choice: [id number] - [place], [address] [place] [address]     Approved medications will be sent to pharmacy, we will reach out to you if there is an issue.  Requests made after 3pm may not be addressed until following business day!

## 2019-12-15 ENCOUNTER — Other Ambulatory Visit: Payer: Self-pay

## 2019-12-15 ENCOUNTER — Ambulatory Visit
Admission: EM | Admit: 2019-12-15 | Discharge: 2019-12-15 | Disposition: A | Discharge status: Home / Self Care | Payer: Medicaid Other | Attending: Emergency Medicine | Admitting: Emergency Medicine

## 2019-12-15 DIAGNOSIS — E1142 Type 2 diabetes mellitus with diabetic polyneuropathy: Secondary | ICD-10-CM

## 2019-12-15 DIAGNOSIS — N764 Abscess of vulva: Secondary | ICD-10-CM

## 2019-12-15 DIAGNOSIS — Z760 Encounter for issue of repeat prescription: Secondary | ICD-10-CM | POA: Diagnosis not present

## 2019-12-15 DIAGNOSIS — E1165 Type 2 diabetes mellitus with hyperglycemia: Secondary | ICD-10-CM | POA: Diagnosis not present

## 2019-12-15 IMAGING — CT CT HEAD W/O CM
3 series · 14 of 47 positions shown, 16 images · non-contrast
Comparison: 02/23/2008

CLINICAL DATA: Contrast-none Her young daughter phoned EMS d/t pt.
"real sleepy". She arrives very drowsy in appearance and in no
distress. She does react with resistive motions with all extremities
when assessed/undressed. Tachypnea with mild hypertension.

EXAM:
CT HEAD WITHOUT CONTRAST
TECHNIQUE: Contiguous axial images were obtained from the base of the skull
through the vertex without intravenous contrast.

[Series 2: head wo · axial · 0.47mm/px · z∈[-200,-65]mm · 8 of 33 slices shown, 10 images]
[im 3/33  brain]
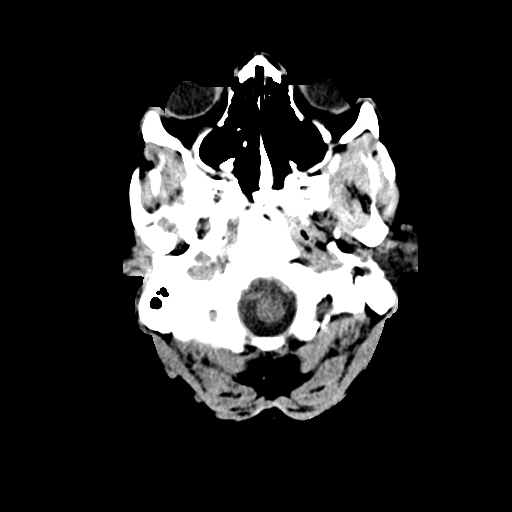
[im 3/33  bone]
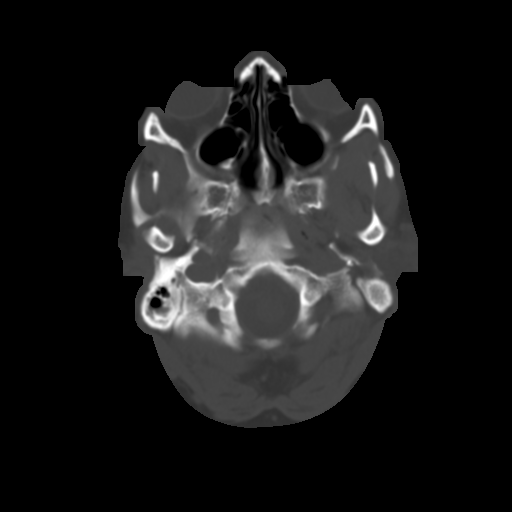
[im 7/33  brain]
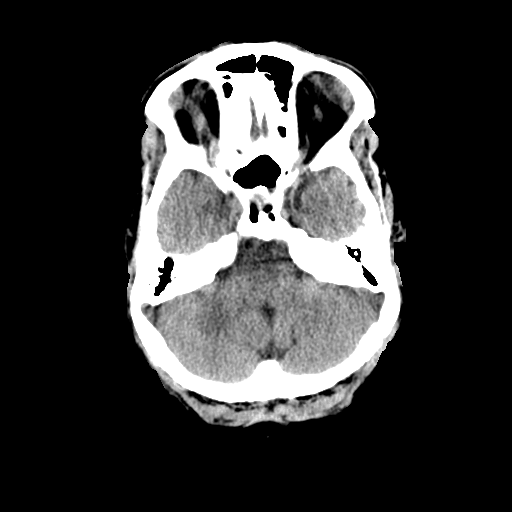
[im 10/33  brain]
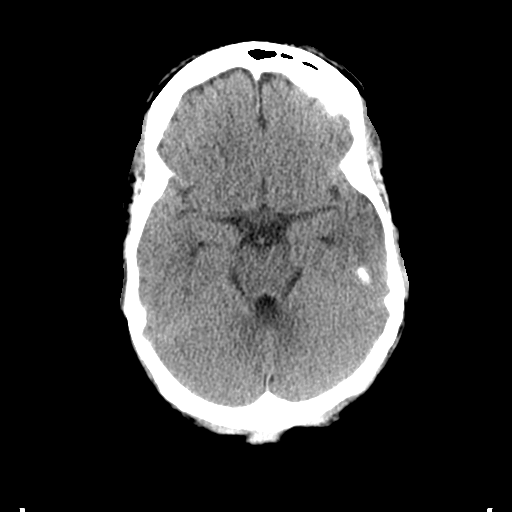
[im 15/33  brain]
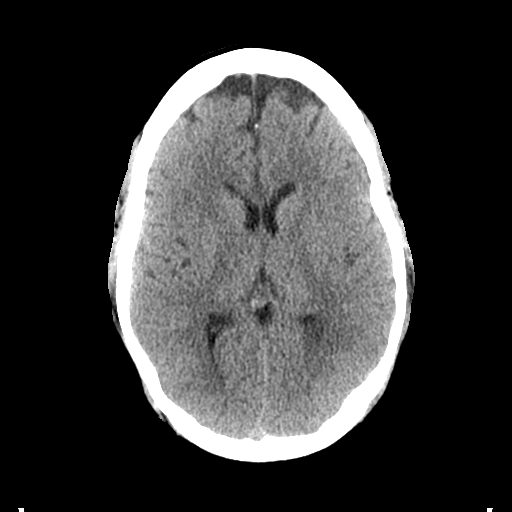
[im 18/33  brain]
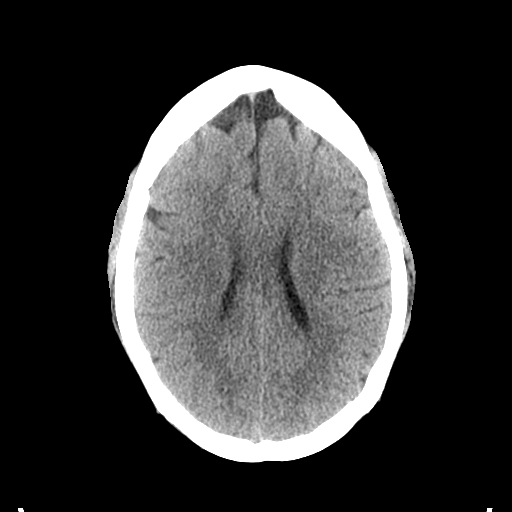
[im 18/33  bone]
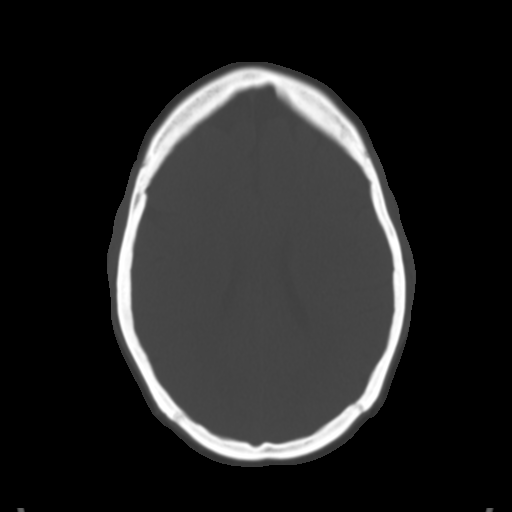
[im 23/33  brain]
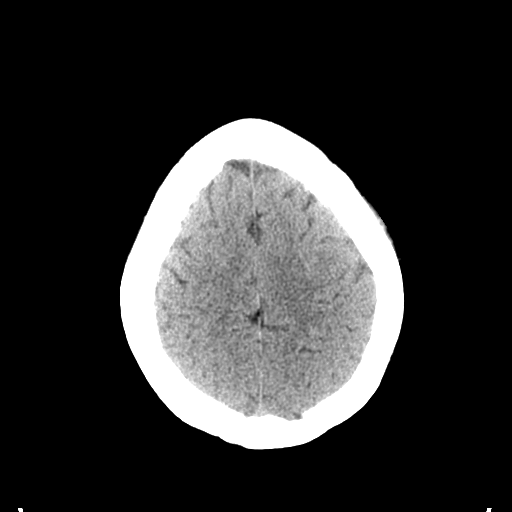
[im 26/33  brain]
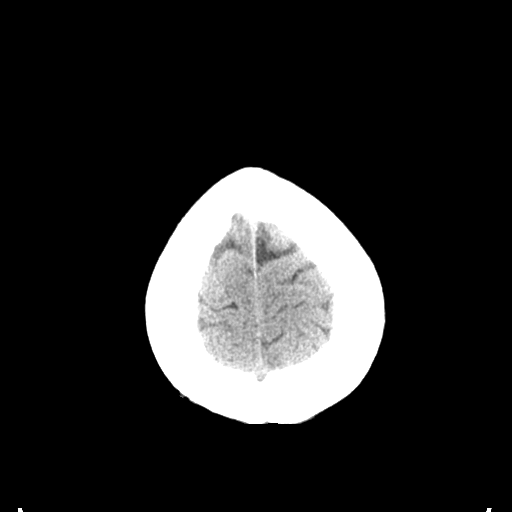
[im 30/33  brain]
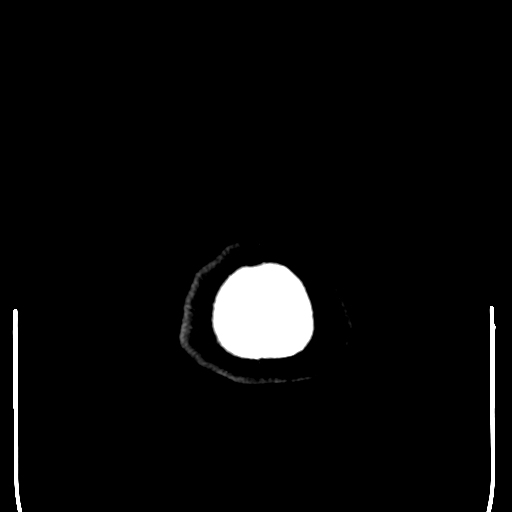

[Series 5: coronal soft tissue · coronal · 0.32mm/px · 3 of 80 slices shown]
[im 27/80  brain]
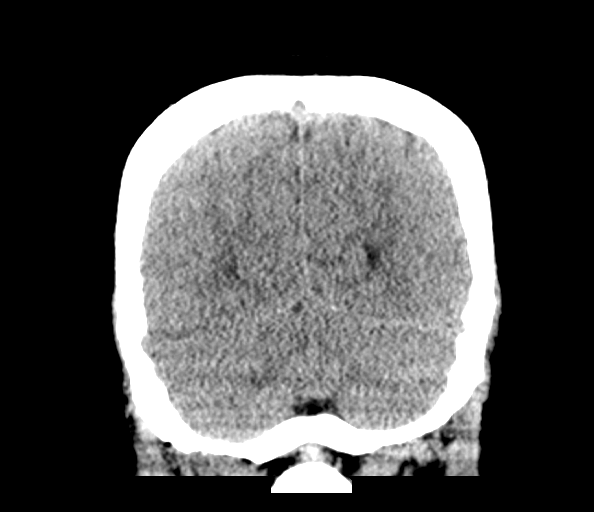
[im 36/80  brain]
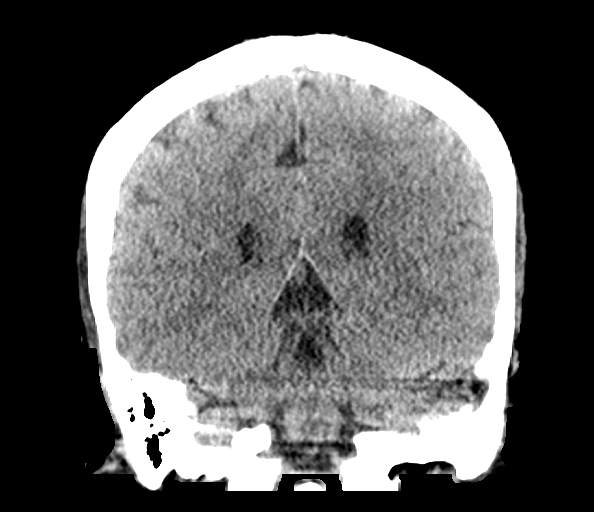
[im 44/80  brain]
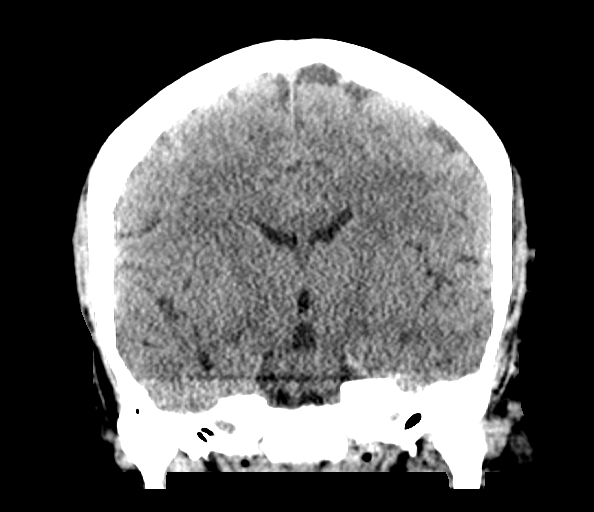

[Series 6: sagittal soft tissue · sagittal · 0.32mm/px · 3 of 84 slices shown]
[im 28/84  brain]
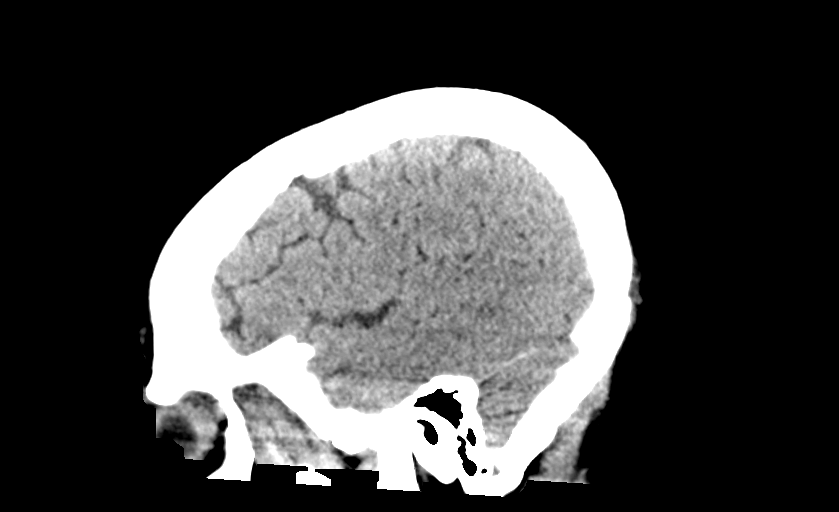
[im 42/84  brain]
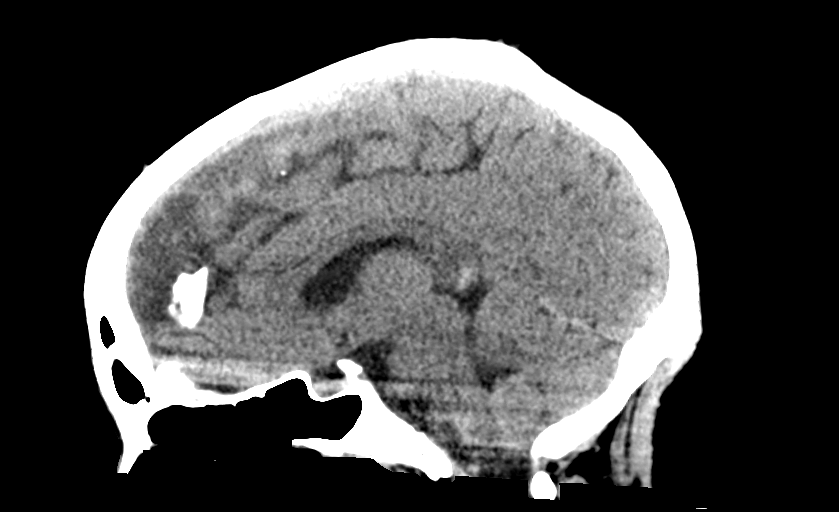
[im 56/84  brain]
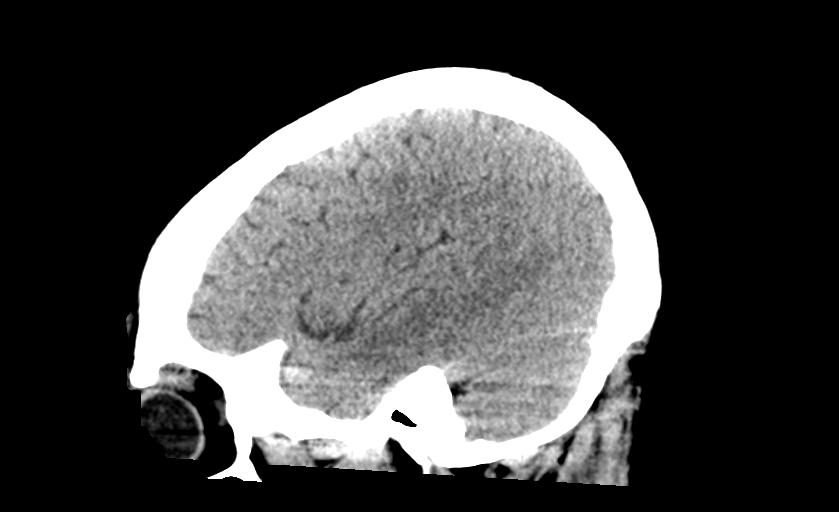

[14 of 47 positions shown; findings below may reference images not displayed]

FINDINGS: Brain: No evidence of acute infarction, hemorrhage, hydrocephalus,
extra-axial collection or mass lesion/mass effect.

Vascular: No hyperdense vessel or unexpected calcification.

Skull: Normal. Negative for fracture or focal lesion.

Sinuses/Orbits: No acute finding.

Other: There is cerumen within the bilateral external auditory
canals.
IMPRESSION: No evidence for acute intracranial abnormality.

## 2019-12-15 MED ORDER — AMOXICILLIN-POT CLAVULANATE 875-125 MG PO TABS
1.0000 | ORAL_TABLET | Freq: Two times a day (BID) | ORAL | 0 refills | Status: DC
Start: 2019-12-15 — End: 2020-01-20

## 2019-12-15 MED ORDER — METFORMIN HCL 1000 MG PO TABS: 1000.0000 mg | ORAL_TABLET | Freq: Two times a day (BID) | ORAL | 0 refills | Status: DC

## 2019-12-15 MED ORDER — FLUCONAZOLE 200 MG PO TABS
200.0000 mg | ORAL_TABLET | Freq: Once | ORAL | 0 refills | Status: AC
Start: 2019-12-15 — End: 2019-12-15

## 2019-12-15 MED ORDER — CEFTRIAXONE SODIUM 1 G IJ SOLR
1.0000 g | Freq: Once | INTRAMUSCULAR | Status: AC
  Administered 2019-12-15: 1 g via INTRAMUSCULAR

## 2019-12-15 MED ORDER — NAPROXEN 500 MG PO TABS
500.0000 mg | ORAL_TABLET | Freq: Two times a day (BID) | ORAL | 0 refills | Status: DC
Start: 2019-12-15 — End: 2020-01-20

## 2019-12-15 NOTE — Discharge Instructions (Signed)
Keep area(s) clean and dry. °Apply hot compress / towel for 5-10 minutes 3-5 times daily. °Take antibiotic as prescribed with food - important to complete course. °Return for worsening pain, redness, swelling, discharge, fever. ° °Helpful prevention tips: °Keep nails short to avoid secondary skin infections. °Use new, clean razors when shaving. °Avoid antiperspirants - look for deodorants without aluminum. °Avoid wearing underwire bras as this can irritate the area further.  °

## 2019-12-15 NOTE — ED Provider Notes (Signed)
[facility]    CSN: [id number] Arrival date & time: 12/15/19  1355      History   Chief Complaint Chief Complaint  Patient presents with  . Abscess  . Medication Refill    HPI [name] is a 44 y.o. female with history of diabetes, IBS, fibroids, hypertension presenting for left-sided vulvar abscess.  Patient states is been going for last 4 days.  Has tried hot compresses without relief.  Patient also requesting refill on Metformin: Followed by PCP, though states she has not been able to get a refill in the last 2 weeks.  Does not routinely monitor sugars, though denies symptoms of poor control.   Past Medical History:  Diagnosis Date  . Abnormal Pap smear   . Bipolar 1 disorder ([place])   . Diabetes mellitus without complication ([place])   . Fibroids   . Hypertension   . IBS (irritable bowel syndrome)     Patient Active Problem List   Diagnosis Date Noted  . Essential hypertension 04/08/2019  . Class 2 severe obesity due to excess calories with serious comorbidity and body mass index (BMI) of 39.0 to 39.9 in adult ([place]) 04/08/2019  . Tobacco dependence 04/08/2019  . Schizoaffective disorder, bipolar type ([place]) 05/18/2018  . Diabetes mellitus ([place]) 08/10/2016  . Bipolar disorder, curr episode mixed, severe, with psychotic features ([place]) 08/07/2016  . Cannabis use disorder, moderate, dependence ([place]) 08/07/2016  . CHOLECYSTITIS, UNSPECIFIED 06/12/2008  . BILIARY COLIC 80/32/1224  . CERUMEN IMPACTION, BILATERAL 01/17/2008  . PHARYNGITIS, VIRAL 01/17/2008  . NECK PAIN 01/17/2008    Past Surgical History:  Procedure Laterality Date  . CERVICAL BIOPSY    . CHOLECYSTECTOMY    . TUBAL LIGATION      OB History    Gravida  7   Para  2   Term  2   Preterm      AB  5   Living  2     SAB      TAB  4   Ectopic  1   Multiple      Live Births               Home Medications    Prior to Admission medications   Medication Sig Start Date End  Date Taking? Authorizing Provider  amoxicillin-clavulanate (AUGMENTIN) 875-125 MG tablet Take 1 tablet by mouth every 12 (twelve) hours. 12/15/19   Hall-Potvin, [place], PA-C  ARIPiprazole ER (ABILIFY MAINTENA) 400 MG SRER injection Inject 400 mg into the muscle every 28 (twenty-eight) days.    [provider]  atorvastatin (LIPITOR) 20 MG tablet Take 1 tablet (20 mg total) by mouth daily. 04/09/19   Ladell Pier, MD  Blood Glucose Monitoring Suppl (TRUE METRIX METER) w/Device KIT Use as directed 04/09/19   Ladell Pier, MD  fluconazole (DIFLUCAN) 200 MG tablet Take 1 tablet (200 mg total) by mouth once for 1 dose. May repeat in 72 hours if needed 12/15/19 12/15/19  Hall-Potvin, [place], PA-C  glucose blood (TRUE METRIX BLOOD GLUCOSE TEST) test strip Use as instructed 04/09/19   Ladell Pier, MD  hydrOXYzine (ATARAX/VISTARIL) 10 MG tablet Take 10 mg by mouth at bedtime as needed and may repeat dose one time if needed.    [provider]  Insulin Glargine (LANTUS SOLOSTAR) 100 UNIT/ML Solostar Pen Inject 14 Units into the skin at bedtime. 04/09/19   Ladell Pier, MD  Insulin Pen Needle (PEN NEEDLES) 31G X 8 MM MISC  Use to inject insulin once daily. E11.42 04/17/19   Ladell Pier, MD  lisinopril (ZESTRIL) 5 MG tablet Take 1 tablet (5 mg total) by mouth daily. 04/08/19   Ladell Pier, MD  metFORMIN (GLUCOPHAGE) 1000 MG tablet Take 1 tablet (1,000 mg total) by mouth 2 (two) times daily with a meal. For diabetes 12/15/19   Hall-Potvin, [place], PA-C  naproxen (NAPROSYN) 500 MG tablet Take 1 tablet (500 mg total) by mouth 2 (two) times daily. 12/15/19   Hall-Potvin, [place], PA-C  nicotine (NICOTROL) 10 MG inhaler Inhale 1 Cartridge (1 continuous puffing total) into the lungs as needed for smoking cessation. 04/08/19   Ladell Pier, MD  TRUEplus Lancets 28G MISC Use as directed 04/09/19   Ladell Pier, MD    Family History Family History  Problem  Relation Age of Onset  . Diabetes Father   . Diabetes Paternal Grandmother   . Diabetes Maternal Grandmother   . Mental illness Cousin     Social History Social History   Tobacco Use  . Smoking status: Current Every Day Smoker    Packs/day: 1.00    Years: 3.00    Pack years: 3.00    Types: Cigarettes  . Smokeless tobacco: Never Used  Vaping Use  . Vaping Use: Never used  Substance Use Topics  . Alcohol use: Yes    Comment: occasional  . Drug use: Yes    Types: Marijuana    Comment: CBD gummies     Allergies   Patient has no known allergies.   Review of Systems As per HPI   Physical Exam Triage Vital Signs ED Triage Vitals  Enc Vitals Group     BP      Pulse      Resp      Temp      Temp src      SpO2      Weight      Height      Head Circumference      Peak Flow      Pain Score      Pain Loc      Pain Edu?      Excl. in [place]?    No data found.  Updated Vital Signs BP (!) 153/110   Pulse (!) 105   Temp 97.8 F (36.6 C)   Resp 18   SpO2 97%   Visual Acuity Right Eye Distance:   Left Eye Distance:   Bilateral Distance:    Right Eye Near:   Left Eye Near:    Bilateral Near:     Physical Exam Constitutional:      General: She is not in acute distress. HENT:     Head: Normocephalic and atraumatic.  Eyes:     General: No scleral icterus.    Pupils: Pupils are equal, round, and reactive to light.  Cardiovascular:     Rate and Rhythm: Normal rate.  Pulmonary:     Effort: Pulmonary effort is normal.  Genitourinary:    Comments: Large (5cm) anterolateral left vulvar abscess with exquisite TTP. Skin:    Coloration: Skin is not jaundiced or pale.  Neurological:     Mental Status: She is alert and oriented to person, place, and time.      UC Treatments / Results  Labs (all labs ordered are listed, but only abnormal results are displayed) Labs Reviewed - No data to display  EKG   Radiology No results  found.  Procedures  Procedures (including critical care time)  Medications Ordered in UC Medications  cefTRIAXone (ROCEPHIN) injection 1 g (1 g Intramuscular Given 12/15/19 1454)    Initial Impression / Assessment and Plan / UC Course  I have reviewed the triage vital signs and the nursing notes.  Pertinent labs & imaging results that were available during my care of the patient were reviewed by me and considered in my medical decision making (see chart for details).     Patient febrile, nontoxic in office today.  Patient does have significant pain.  Discussed I&D: Patient wanted to attempt, though was unable to tolerate cleaning of the site.  Purulent discharge was expressed through cleaning and mild pressure application.  Offered needle aspiration: Patient declined.  Offered pain management in office: Patient declined.  Patient did accept Rocephin injection which she tolerated well.  Will start Augmentin, encourage sitz bath's, and provide Diflucan should patient develop yeast infection.  Discussed risk of recurrent infections with uncontrolled diabetes.  This provider coordinated care: Patient has follow-up appointment on 01/20/2020 at 3:50 PM.  Patient verbalized understanding, agreeable to plan.  Also relayed appointment information and A/P with patient's daughter who was present at time of discharge.  Return precautions discussed, patient verbalized understanding and is agreeable to plan. Final Clinical Impressions(s) / UC Diagnoses   Final diagnoses:  Vulvar abscess  Encounter for medication refill  Type 2 diabetes mellitus with hyperglycemia, unspecified whether long term insulin use [facility])     Discharge Instructions     Keep area(s) clean and dry. Apply hot compress / towel for 5-10 minutes 3-5 times daily. Take antibiotic as prescribed with food - important to complete course. Return for worsening pain, redness, swelling, discharge, fever.  Helpful prevention tips: Keep nails  short to avoid secondary skin infections. Use new, clean razors when shaving. Avoid antiperspirants - look for deodorants without aluminum. Avoid wearing underwire bras as this can irritate the area further.     ED Prescriptions    Medication Sig Dispense Auth. Provider   metFORMIN (GLUCOPHAGE) 1000 MG tablet Take 1 tablet (1,000 mg total) by mouth 2 (two) times daily with a meal. For diabetes 60 tablet Hall-Potvin, [place], PA-C   amoxicillin-clavulanate (AUGMENTIN) 875-125 MG tablet Take 1 tablet by mouth every 12 (twelve) hours. 14 tablet Hall-Potvin, [place], PA-C   fluconazole (DIFLUCAN) 200 MG tablet Take 1 tablet (200 mg total) by mouth once for 1 dose. May repeat in 72 hours if needed 2 tablet Hall-Potvin, [place], PA-C   naproxen (NAPROSYN) 500 MG tablet Take 1 tablet (500 mg total) by mouth 2 (two) times daily. 30 tablet Hall-Potvin, [place], PA-C     I have reviewed the PDMP during this encounter.   Hall-Potvin, [place], [place] 12/15/19 1545

## 2019-12-15 NOTE — ED Triage Notes (Signed)
Pt c/o abscess to vagina x 4 days. Using warm compresses.    Also requesting refill on metformin that she has not had in 2 weeks

## 2020-01-20 ENCOUNTER — Telehealth (INDEPENDENT_AMBULATORY_CARE_PROVIDER_SITE_OTHER): Payer: Medicaid Other | Admitting: Internal Medicine

## 2020-01-20 ENCOUNTER — Encounter: Payer: Self-pay | Admitting: Internal Medicine

## 2020-01-20 DIAGNOSIS — Z1231 Encounter for screening mammogram for malignant neoplasm of breast: Secondary | ICD-10-CM

## 2020-01-20 DIAGNOSIS — E1142 Type 2 diabetes mellitus with diabetic polyneuropathy: Secondary | ICD-10-CM

## 2020-01-20 DIAGNOSIS — E782 Mixed hyperlipidemia: Secondary | ICD-10-CM

## 2020-01-20 DIAGNOSIS — I10 Essential (primary) hypertension: Secondary | ICD-10-CM

## 2020-01-20 DIAGNOSIS — Z1159 Encounter for screening for other viral diseases: Secondary | ICD-10-CM

## 2020-01-20 MED ORDER — ATORVASTATIN CALCIUM 20 MG PO TABS: 20.0000 mg | ORAL_TABLET | Freq: Every day | ORAL | 1 refills | Status: DC

## 2020-01-20 MED ORDER — LISINOPRIL 5 MG PO TABS: 5.0000 mg | ORAL_TABLET | Freq: Every day | ORAL | 1 refills | Status: DC

## 2020-01-20 MED ORDER — LANTUS SOLOSTAR 100 UNIT/ML ~~LOC~~ SOPN: 14.0000 [IU] | PEN_INJECTOR | Freq: Every day | SUBCUTANEOUS | 5 refills | Status: DC

## 2020-01-20 MED ORDER — TRUEPLUS LANCETS 28G MISC: 2 refills | Status: DC

## 2020-01-20 MED ORDER — TRUE METRIX BLOOD GLUCOSE TEST VI STRP: ORAL_STRIP | 2 refills | Status: DC

## 2020-01-20 MED ORDER — METFORMIN HCL 1000 MG PO TABS: 1000.0000 mg | ORAL_TABLET | Freq: Two times a day (BID) | ORAL | 1 refills | Status: DC

## 2020-01-20 MED ORDER — PEN NEEDLES 31G X 8 MM MISC: 6 refills | Status: DC

## 2020-01-20 NOTE — Progress Notes (Signed)
Virtual Visit via Telephone Note  I connected with [name], on 01/20/2020 at 4:00 PM by telephone due to the COVID-19 pandemic and verified that I am speaking with the correct person using two identifiers.   Consent: I discussed the limitations, risks, security and privacy concerns of performing an evaluation and management service by telephone and the availability of in person appointments. I also discussed with the patient that there may be a patient responsible charge related to this service. The patient expressed understanding and agreed to proceed.   Location of Patient: Home   Location of Provider: Clinic    Persons participating in Telemedicine visit: [facility] Dr. Juleen China      History of Present Illness: Patient has a visit to follow up on chronic medical conditions.   Diabetes mellitus, Type 2 Disease Monitoring             Blood Sugar Ranges: Fasting - Does not monitor at home             Polyuria: no             Visual problems: no   Urine Microalbumin: 16 (Oct 2020)---reports not taking her Lisinopril   Last A1C: 14.0   Medications: Metformin 1000 mg BID, Lantus 14u  Medication Compliance: no, patient reports that she never started insulin. Says she can't handle the needles. She does report compliance with Metformin.   Medication Side Effects             Hypoglycemia: no        Past Medical History:  Diagnosis Date  . Abnormal Pap smear   . Bipolar 1 disorder ([place])   . Diabetes mellitus without complication ([place])   . Fibroids   . Hypertension   . IBS (irritable bowel syndrome)    No Known Allergies  Current Outpatient Medications on File Prior to Visit  Medication Sig Dispense Refill  . ARIPiprazole ER (ABILIFY MAINTENA) 400 MG SRER injection Inject 400 mg into the muscle every 28 (twenty-eight) days.    Marland Kitchen atorvastatin (LIPITOR) 20 MG tablet Take 1 tablet (20 mg total) by mouth daily. 30 tablet 3  . Blood Glucose Monitoring  Suppl (TRUE METRIX METER) w/Device KIT Use as directed 1 kit 0  . glucose blood (TRUE METRIX BLOOD GLUCOSE TEST) test strip Use as instructed 100 each 12  . hydrOXYzine (ATARAX/VISTARIL) 10 MG tablet Take 10 mg by mouth at bedtime as needed and may repeat dose one time if needed.    . Insulin Glargine (LANTUS SOLOSTAR) 100 UNIT/ML Solostar Pen Inject 14 Units into the skin at bedtime. 5 pen PRN  . Insulin Pen Needle (PEN NEEDLES) 31G X 8 MM MISC Use to inject insulin once daily. E11.42 100 each 6  . lisinopril (ZESTRIL) 5 MG tablet Take 1 tablet (5 mg total) by mouth daily. 30 tablet 4  . metFORMIN (GLUCOPHAGE) 1000 MG tablet Take 1 tablet (1,000 mg total) by mouth 2 (two) times daily with a meal. For diabetes 60 tablet 0  . naproxen (NAPROSYN) 500 MG tablet Take 1 tablet (500 mg total) by mouth 2 (two) times daily. 30 tablet 0  . nicotine (NICOTROL) 10 MG inhaler Inhale 1 Cartridge (1 continuous puffing total) into the lungs as needed for smoking cessation. 42 each 1  . TRUEplus Lancets 28G MISC Use as directed 100 each 4   No current facility-administered medications on file prior to visit.    Observations/Objective: NAD. Speaking clearly.  Work  of breathing normal.  Alert and oriented. Mood appropriate.   Assessment and Plan: 1. Type 2 diabetes mellitus with diabetic polyneuropathy, without long-term current use of insulin (HCC) Last A1c showed very poor glycemic control with result of 14. Patient does not monitor CBGs at home. Reports non-compliance with insulin therapy due to fear of needles. Says she has been working on diet changes. Will repeat A1c. Suspect will need additional therapy beyond Metformin.  - Hemoglobin A1c; Future - Comprehensive metabolic panel; Future - Lipid Panel; Future - glucose blood (TRUE METRIX BLOOD GLUCOSE TEST) test strip; Use to check fasting FSBS daily. Dx: E11.42  Dispense: 100 each; Refill: 2 - Insulin Pen Needle (PEN NEEDLES) 31G X 8 MM MISC; Use to  inject insulin once daily. E11.42  Dispense: 100 each; Refill: 6 - metFORMIN (GLUCOPHAGE) 1000 MG tablet; Take 1 tablet (1,000 mg total) by mouth 2 (two) times daily with a meal.  Dispense: 180 tablet; Refill: 1 - TRUEplus Lancets 28G MISC; Use to check fasting FSBS daily. Dx: E11.42  Dispense: 100 each; Refill: 2  2. Essential hypertension Has not been compliant with Lisinopril. Encouraged to restart especially due to renal protection with history of DM.  - CBC with Differential; Future - Comprehensive metabolic panel; Future - lisinopril (ZESTRIL) 5 MG tablet; Take 1 tablet (5 mg total) by mouth daily.  Dispense: 90 tablet; Refill: 1  3. Mixed hyperlipidemia - Lipid Panel; Future - atorvastatin (LIPITOR) 20 MG tablet; Take 1 tablet (20 mg total) by mouth daily.  Dispense: 90 tablet; Refill: 1  4. Need for hepatitis C screening test - Hepatitis C Antibody; Future  5. Breast cancer screening by mammogram - MM Digital Screening; Future   Follow Up Instructions: Lab visit 7/3   I discussed the assessment and treatment plan with the patient. The patient was provided an opportunity to ask questions and all were answered. The patient agreed with the plan and demonstrated an understanding of the instructions.   The patient was advised to call back or seek an in-person evaluation if the symptoms worsen or if the condition fails to improve as anticipated.     I provided 20 minutes total of non-face-to-face time during this encounter including median intraservice time, reviewing previous notes, investigations, ordering medications, medical decision making, coordinating care and patient verbalized understanding at the end of the visit.    Phill Myron, D.O. Primary Care at [facility]  01/20/2020, 4:00 PM

## 2020-01-22 ENCOUNTER — Other Ambulatory Visit: Payer: Medicaid Other

## 2020-01-23 ENCOUNTER — Other Ambulatory Visit: Payer: Medicaid Other

## 2020-02-04 ENCOUNTER — Encounter (HOSPITAL_COMMUNITY): Payer: Self-pay | Admitting: Emergency Medicine

## 2020-02-04 ENCOUNTER — Emergency Department (HOSPITAL_COMMUNITY)
Admission: EM | Admit: 2020-02-04 | Discharge: 2020-02-04 | Disposition: A | Discharge status: Home / Self Care | Payer: Medicaid Other | Attending: Emergency Medicine | Admitting: Emergency Medicine

## 2020-02-04 DIAGNOSIS — R631 Polydipsia: Secondary | ICD-10-CM | POA: Diagnosis present

## 2020-02-04 DIAGNOSIS — Z5321 Procedure and treatment not carried out due to patient leaving prior to being seen by health care provider: Secondary | ICD-10-CM | POA: Insufficient documentation

## 2020-02-04 DIAGNOSIS — R358 Other polyuria: Secondary | ICD-10-CM | POA: Diagnosis not present

## 2020-02-04 LAB — CBC
HCT: 40.6 % (ref 36.0–46.0)
Hemoglobin: 12.5 g/dL (ref 12.0–15.0)
MCH: 23.7 pg — ABNORMAL LOW (ref 26.0–34.0)
MCHC: 30.8 g/dL (ref 30.0–36.0)
MCV: 76.9 fL — ABNORMAL LOW (ref 80.0–100.0)
Platelets: 419 10*3/uL — ABNORMAL HIGH (ref 150–400)
RBC: 5.28 MIL/uL — ABNORMAL HIGH (ref 3.87–5.11)
RDW: 18.3 % — ABNORMAL HIGH (ref 11.5–15.5)
WBC: 10.6 10*3/uL — ABNORMAL HIGH (ref 4.0–10.5)
nRBC: 0 % (ref 0.0–0.2)

## 2020-02-04 LAB — BASIC METABOLIC PANEL
Anion gap: 19 — ABNORMAL HIGH (ref 5–15)
BUN: <5 mg/dL — ABNORMAL LOW (ref 6–20)
CO2: 13 mmol/L — ABNORMAL LOW (ref 22–32)
Calcium: 9.9 mg/dL (ref 8.9–10.3)
Chloride: 101 mmol/L (ref 98–111)
Creatinine, Ser: 0.81 mg/dL (ref 0.44–1.00)
GFR calc Af Amer: >60 mL/min (ref >60)
GFR calc non Af Amer: >60 mL/min (ref >60)
Glucose, Bld: 396 mg/dL — ABNORMAL HIGH (ref 70–99)
Potassium: 4 mmol/L (ref 3.5–5.1)
Sodium: 133 mmol/L — ABNORMAL LOW (ref 135–145)

## 2020-02-04 LAB — URINALYSIS, ROUTINE W REFLEX MICROSCOPIC
Bacteria, UA: NONE SEEN
Bilirubin Urine: NEGATIVE
Glucose, UA: >=500 mg/dL — AB
Ketones, ur: 80 mg/dL — AB
Leukocytes,Ua: NEGATIVE
Nitrite: NEGATIVE
Protein, ur: 30 mg/dL — AB
RBC / HPF: >50 RBC/hpf — ABNORMAL HIGH (ref 0–5)
Specific Gravity, Urine: 1.03 (ref 1.005–1.030)
pH: 6 (ref 5.0–8.0)

## 2020-02-04 LAB — CBG MONITORING, ED: Glucose-Capillary: 353 mg/dL — ABNORMAL HIGH (ref 70–99)

## 2020-02-04 LAB — I-STAT BETA HCG BLOOD, ED (MC, WL, AP ONLY): I-stat hCG, quantitative: <5 m[IU]/mL (ref <5)

## 2020-02-04 NOTE — ED Notes (Signed)
Pt stated she was going outside to wait for her daughter to pick her up after being advised to stay.

## 2020-02-04 NOTE — ED Triage Notes (Addendum)
Pt arrives via [facility] from home with c/o n/v polydipsia and polyuria. Pt received 1st covid vaccine yesterday and is also hyperglycemic-hx of diabetes but does not check sugar regularly. EMS CBG 412. Received 4mg  zofran PO en route. EMS VS 170/96, 100% O2 on ra, 28 RR, 90HR .

## 2020-02-06 ENCOUNTER — Encounter (HOSPITAL_BASED_OUTPATIENT_CLINIC_OR_DEPARTMENT_OTHER): Payer: Self-pay

## 2020-02-06 ENCOUNTER — Ambulatory Visit
Admission: EM | Admit: 2020-02-06 | Discharge: 2020-02-06 | Disposition: A | Discharge status: Home / Self Care | Payer: Medicaid Other | Attending: Physician Assistant | Admitting: Physician Assistant

## 2020-02-06 ENCOUNTER — Emergency Department (HOSPITAL_BASED_OUTPATIENT_CLINIC_OR_DEPARTMENT_OTHER)
Admission: EM | Admit: 2020-02-06 | Discharge: 2020-02-07 | Disposition: A | Discharge status: Home / Self Care | Payer: Medicaid Other | Attending: Emergency Medicine | Admitting: Emergency Medicine

## 2020-02-06 ENCOUNTER — Other Ambulatory Visit: Payer: Self-pay

## 2020-02-06 DIAGNOSIS — F17210 Nicotine dependence, cigarettes, uncomplicated: Secondary | ICD-10-CM | POA: Diagnosis not present

## 2020-02-06 DIAGNOSIS — Z79899 Other long term (current) drug therapy: Secondary | ICD-10-CM | POA: Insufficient documentation

## 2020-02-06 DIAGNOSIS — Z794 Long term (current) use of insulin: Secondary | ICD-10-CM | POA: Diagnosis not present

## 2020-02-06 DIAGNOSIS — E1110 Type 2 diabetes mellitus with ketoacidosis without coma: Secondary | ICD-10-CM

## 2020-02-06 DIAGNOSIS — E1165 Type 2 diabetes mellitus with hyperglycemia: Secondary | ICD-10-CM | POA: Insufficient documentation

## 2020-02-06 DIAGNOSIS — R109 Unspecified abdominal pain: Secondary | ICD-10-CM | POA: Diagnosis not present

## 2020-02-06 DIAGNOSIS — N939 Abnormal uterine and vaginal bleeding, unspecified: Secondary | ICD-10-CM | POA: Insufficient documentation

## 2020-02-06 DIAGNOSIS — R739 Hyperglycemia, unspecified: Secondary | ICD-10-CM | POA: Diagnosis present

## 2020-02-06 DIAGNOSIS — R112 Nausea with vomiting, unspecified: Secondary | ICD-10-CM | POA: Diagnosis not present

## 2020-02-06 DIAGNOSIS — I10 Essential (primary) hypertension: Secondary | ICD-10-CM | POA: Insufficient documentation

## 2020-02-06 DIAGNOSIS — R0602 Shortness of breath: Secondary | ICD-10-CM | POA: Insufficient documentation

## 2020-02-06 LAB — URINALYSIS, ROUTINE W REFLEX MICROSCOPIC
Glucose, UA: >=500 mg/dL — AB
Ketones, ur: >80 mg/dL — AB
Leukocytes,Ua: NEGATIVE
Nitrite: NEGATIVE
Protein, ur: 100 mg/dL — AB
Specific Gravity, Urine: 1.025 (ref 1.005–1.030)
pH: 5.5 (ref 5.0–8.0)

## 2020-02-06 LAB — BASIC METABOLIC PANEL
Anion gap: 17 — ABNORMAL HIGH (ref 5–15)
Anion gap: 13 (ref 5–15)
BUN: 13 mg/dL (ref 6–20)
BUN: 11 mg/dL (ref 6–20)
CO2: 17 mmol/L — ABNORMAL LOW (ref 22–32)
CO2: 19 mmol/L — ABNORMAL LOW (ref 22–32)
Calcium: 9.7 mg/dL (ref 8.9–10.3)
Calcium: 8.8 mg/dL — ABNORMAL LOW (ref 8.9–10.3)
Chloride: 93 mmol/L — ABNORMAL LOW (ref 98–111)
Chloride: 101 mmol/L (ref 98–111)
Creatinine, Ser: 0.83 mg/dL (ref 0.44–1.00)
Creatinine, Ser: 0.74 mg/dL (ref 0.44–1.00)
GFR calc Af Amer: >60 mL/min (ref >60)
GFR calc Af Amer: >60 mL/min (ref >60)
GFR calc non Af Amer: >60 mL/min (ref >60)
GFR calc non Af Amer: >60 mL/min (ref >60)
Glucose, Bld: 346 mg/dL — ABNORMAL HIGH (ref 70–99)
Glucose, Bld: 152 mg/dL — ABNORMAL HIGH (ref 70–99)
Potassium: 3.3 mmol/L — ABNORMAL LOW (ref 3.5–5.1)
Potassium: 2.8 mmol/L — ABNORMAL LOW (ref 3.5–5.1)
Sodium: 127 mmol/L — ABNORMAL LOW (ref 135–145)
Sodium: 133 mmol/L — ABNORMAL LOW (ref 135–145)

## 2020-02-06 LAB — CBC
HCT: 44.6 % (ref 36.0–46.0)
Hemoglobin: 14 g/dL (ref 12.0–15.0)
MCH: 23.9 pg — ABNORMAL LOW (ref 26.0–34.0)
MCHC: 31.4 g/dL (ref 30.0–36.0)
MCV: 76.1 fL — ABNORMAL LOW (ref 80.0–100.0)
Platelets: 554 10*3/uL — ABNORMAL HIGH (ref 150–400)
RBC: 5.86 MIL/uL — ABNORMAL HIGH (ref 3.87–5.11)
RDW: 19.6 % — ABNORMAL HIGH (ref 11.5–15.5)
WBC: 13.6 10*3/uL — ABNORMAL HIGH (ref 4.0–10.5)
nRBC: 0 % (ref 0.0–0.2)

## 2020-02-06 LAB — I-STAT VENOUS BLOOD GAS, ED
Acid-base deficit: 5 mmol/L — ABNORMAL HIGH (ref 0.0–2.0)
Bicarbonate: 18.1 mmol/L — ABNORMAL LOW (ref 20.0–28.0)
Calcium, Ion: 1.2 mmol/L (ref 1.15–1.40)
HCT: 42 % (ref 36.0–46.0)
Hemoglobin: 14.3 g/dL (ref 12.0–15.0)
O2 Saturation: 36 %
Potassium: 3.4 mmol/L — ABNORMAL LOW (ref 3.5–5.1)
Sodium: 131 mmol/L — ABNORMAL LOW (ref 135–145)
TCO2: 19 mmol/L — ABNORMAL LOW (ref 22–32)
pCO2, Ven: 26.8 mmHg — ABNORMAL LOW (ref 44.0–60.0)
pH, Ven: 7.437 — ABNORMAL HIGH (ref 7.250–7.430)
pO2, Ven: 20 mmHg — CL (ref 32.0–45.0)

## 2020-02-06 LAB — HEPATIC FUNCTION PANEL
ALT: 30 U/L (ref 0–44)
AST: 19 U/L (ref 15–41)
Albumin: 4.3 g/dL (ref 3.5–5.0)
Alkaline Phosphatase: 103 U/L (ref 38–126)
Bilirubin, Direct: <0.1 mg/dL (ref 0.0–0.2)
Total Bilirubin: 1.4 mg/dL — ABNORMAL HIGH (ref 0.3–1.2)
Total Protein: 9.2 g/dL — ABNORMAL HIGH (ref 6.5–8.1)

## 2020-02-06 LAB — CBG MONITORING, ED
Glucose-Capillary: 352 mg/dL — ABNORMAL HIGH (ref 70–99)
Glucose-Capillary: 286 mg/dL — ABNORMAL HIGH (ref 70–99)

## 2020-02-06 LAB — URINALYSIS, MICROSCOPIC (REFLEX): RBC / HPF: >50 RBC/hpf (ref 0–5)

## 2020-02-06 LAB — LIPASE, BLOOD: Lipase: 33 U/L (ref 11–51)

## 2020-02-06 LAB — POCT FASTING CBG KUC MANUAL ENTRY: POCT Glucose (KUC): 381 mg/dL — AB (ref 70–99)

## 2020-02-06 LAB — PREGNANCY, URINE: Preg Test, Ur: NEGATIVE

## 2020-02-06 MED ORDER — POTASSIUM CHLORIDE CRYS ER 20 MEQ PO TBCR: 40.0000 meq | EXTENDED_RELEASE_TABLET | Freq: Once | ORAL | Status: DC

## 2020-02-06 MED ORDER — ONDANSETRON HCL 4 MG/2ML IJ SOLN
4.0000 mg | Freq: Once | INTRAMUSCULAR | Status: AC
  Administered 2020-02-06: 4 mg via INTRAVENOUS
  Filled 2020-02-06: qty 2

## 2020-02-06 MED ORDER — ONDANSETRON HCL 4 MG/2ML IJ SOLN
4.0000 mg | Freq: Once | INTRAMUSCULAR | Status: AC | PRN
  Administered 2020-02-06: 4 mg via INTRAVENOUS
  Filled 2020-02-06: qty 2

## 2020-02-06 MED ORDER — INSULIN ASPART 100 UNIT/ML ~~LOC~~ SOLN
5.0000 [IU] | Freq: Once | SUBCUTANEOUS | Status: AC
  Administered 2020-02-06: 5 [IU] via INTRAVENOUS
  Filled 2020-02-06: qty 5

## 2020-02-06 MED ORDER — LACTATED RINGERS IV SOLN: INTRAVENOUS | Status: DC

## 2020-02-06 MED ORDER — METOCLOPRAMIDE HCL 10 MG PO TABS
10.0000 mg | ORAL_TABLET | Freq: Three times a day (TID) | ORAL | 1 refills | Status: DC
Start: 2020-02-06 — End: 2020-06-14

## 2020-02-06 MED ORDER — MORPHINE SULFATE (PF) 4 MG/ML IV SOLN
4.0000 mg | Freq: Once | INTRAVENOUS | Status: AC
  Administered 2020-02-06: 4 mg via INTRAVENOUS
  Filled 2020-02-06: qty 1

## 2020-02-06 MED ORDER — INSULIN REGULAR HUMAN 100 UNIT/ML IJ SOLN
5.0000 [IU] | Freq: Once | INTRAMUSCULAR | Status: DC
  Filled 2020-02-06: qty 3

## 2020-02-06 MED ORDER — HALOPERIDOL LACTATE 5 MG/ML IJ SOLN
5.0000 mg | Freq: Once | INTRAMUSCULAR | Status: AC
  Administered 2020-02-06: 5 mg via INTRAVENOUS
  Filled 2020-02-06: qty 1

## 2020-02-06 MED ORDER — LACTATED RINGERS IV BOLUS
2000.0000 mL | Freq: Once | INTRAVENOUS | Status: AC
  Administered 2020-02-06: 2000 mL via INTRAVENOUS

## 2020-02-06 NOTE — ED Triage Notes (Signed)
1st moderna on Monday, n/v with weakness, T2DM was seen at [facility] today & sent here for evaluation for DKA. Also c/o numbness left foot that began on Tuesday.

## 2020-02-06 NOTE — ED Provider Notes (Signed)
[facility] EMERGENCY DEPARTMENT Provider Note   CSN: [id number] Arrival date & time: 02/06/20  1657   History Chief Complaint  Patient presents with  . Hyperglycemia   Ms. Henion is a 44 year old female with past medical history significant for T2DM who presents from urgent care with nausea, vomiting, and abdominal pain.  This Monday, she received the first dose of the Moderna vaccine for COVID. Prior to receiving the vaccination, she felt fine, however shortly after, she developed the onset of nausea with one episode of non-bloody vomiting. Her nausea and vomiting have persisted since onset, and have limited her ability to tolerate any food (other than soup) or her home metformin. She previously was taking insulin and metformin, however she was switched to metformin 1015m twice daily due to being nonadherent to her home insulin. Additionally, she reports abdominal pain of her left upper quadrant which she describes as feeling like a "gas bubble under her ribs." [facility], was found to have an anion gap of 19, however she left due to long wait times. Earlier today, she presented to the Urgent Care for evaluation of these symptoms, noted to have glucose of 381, tachycardia, and tachypnea with Kussmaul respirations and was determined to be in DKA and transferred here for further evaluation and management.      Past Medical History:  Diagnosis Date  . Abnormal Pap smear   . Bipolar 1 disorder ([facility]   . Diabetes mellitus without complication ([place]   . Fibroids   . Hypertension   . IBS (irritable bowel syndrome)    Patient Active Problem List   Diagnosis Date Noted  . Essential hypertension 04/08/2019  . Class 2 severe obesity due to excess calories with serious comorbidity and body mass index (BMI) of 39.0 to 39.9 in adult ([facility] 04/08/2019  . Tobacco dependence 04/08/2019  . Schizoaffective disorder, bipolar type  ([facility] 05/18/2018  . Diabetes mellitus ([facility] 08/10/2016  . Bipolar disorder, curr episode mixed, severe, with psychotic features ([place] 08/07/2016  . Cannabis use disorder, moderate, dependence ([facility] 08/07/2016  . CHOLECYSTITIS, UNSPECIFIED 06/12/2008  . BILIARY COLIC 188/28/0034 . CERUMEN IMPACTION, BILATERAL 01/17/2008  . PHARYNGITIS, VIRAL 01/17/2008  . NECK PAIN 01/17/2008   Past Surgical History:  Procedure Laterality Date  . CERVICAL BIOPSY    . CHOLECYSTECTOMY    . TUBAL LIGATION      OB History    Gravida  7   Para  2   Term  2   Preterm      AB  5   Living  2     SAB      TAB  4   Ectopic  1   Multiple      Live Births             Family History  Problem Relation Age of Onset  . Diabetes Father   . Diabetes Paternal Grandmother   . Diabetes Maternal Grandmother   . Mental illness Cousin    Social History   Tobacco Use  . Smoking status: Current Every Day Smoker    Packs/day: 1.00    Years: 3.00    Pack years: 3.00    Types: Cigarettes  . Smokeless tobacco: Never Used  Vaping Use  . Vaping Use: Never used  Substance Use Topics  . Alcohol use: Yes    Comment: occasional  . Drug use: Yes    Types: Marijuana  Comment: CBD gummies   Home Medications Prior to Admission medications   Medication Sig Start Date End Date Taking? Authorizing Provider  ARIPiprazole ER (ABILIFY MAINTENA) 400 MG SRER injection Inject 400 mg into the muscle every 28 (twenty-eight) days.    [provider]  atorvastatin (LIPITOR) 20 MG tablet Take 1 tablet (20 mg total) by mouth daily. 01/20/20   [name] Bang, DO  Blood Glucose Monitoring Suppl (TRUE METRIX METER) w/Device KIT Use as directed 04/09/19   Ladell Pier, MD  glucose blood (TRUE METRIX BLOOD GLUCOSE TEST) test strip Use to check fasting FSBS daily. Dx: E11.42 01/20/20   [name] Bang, DO  hydrOXYzine (ATARAX/VISTARIL) 10 MG tablet Take 10 mg by mouth at bedtime as  needed and may repeat dose one time if needed.    [provider]  Insulin Pen Needle (PEN NEEDLES) 31G X 8 MM MISC Use to inject insulin once daily. E11.42 01/20/20   [name] Bang, DO  lisinopril (ZESTRIL) 5 MG tablet Take 1 tablet (5 mg total) by mouth daily. 01/20/20   [name] Bang, DO  metFORMIN (GLUCOPHAGE) 1000 MG tablet Take 1 tablet (1,000 mg total) by mouth 2 (two) times daily with a meal. 01/20/20   [name] Bang, DO  metoCLOPramide (REGLAN) 10 MG tablet Take 1 tablet (10 mg total) by mouth 3 (three) times daily with meals. 02/06/20 02/05/21  Paulla Dolly, MD  TRUEplus Lancets 28G MISC Use to check fasting FSBS daily. Dx: E11.42 01/20/20   [name] Bang, DO   Allergies    Patient has no known allergies.  Review of Systems   Review of Systems  Constitutional: Negative for chills and fever.  HENT: Negative for ear pain and sore throat.   Eyes: Negative for pain and visual disturbance.  Respiratory: Positive for shortness of breath. Negative for cough.   Cardiovascular: Negative for chest pain and palpitations.  Gastrointestinal: Positive for abdominal pain, nausea and vomiting.  Genitourinary: Positive for vaginal bleeding. Negative for dysuria and hematuria.  Musculoskeletal: Negative for arthralgias and back pain.  Skin: Negative for color change and rash.  Neurological: Negative for seizures and syncope.  All other systems reviewed and are negative.  Physical Exam Updated Vital Signs BP 134/83   Pulse 81   Temp 98.5 F (36.9 C)   Resp 16   Ht 6' (1.829 m)   Wt 129.3 kg   LMP 02/03/2020   SpO2 98%   BMI 38.65 kg/m   Physical Exam Constitutional:      Appearance: She is obese.  HENT:     Head: Normocephalic and atraumatic.  Eyes:     Extraocular Movements: Extraocular movements intact.     Conjunctiva/sclera: Conjunctivae normal.  Cardiovascular:     Rate and Rhythm: Normal rate and regular rhythm.      Pulses: Normal pulses.     Heart sounds: Normal heart sounds.  Pulmonary:     Breath sounds: Normal breath sounds. No wheezing or rales.     Comments: Increased respiratory effort Abdominal:     General: Abdomen is flat. Bowel sounds are normal.     Palpations: Abdomen is soft.     Tenderness: There is abdominal tenderness.     Comments: Tender to palpation in LUQ  Musculoskeletal:        General: No swelling or tenderness. Normal range of motion.     Cervical back: Normal range of motion and neck supple.  Skin:  General: Skin is warm and dry.  Neurological:     General: No focal deficit present.     Mental Status: She is alert and oriented to person, place, and time.  Psychiatric:     Comments: Anxious and frequently tearful throughout history and physical examination    ED Results / Procedures / Treatments   Labs (all labs ordered are listed, but only abnormal results are displayed) Labs Reviewed  BASIC METABOLIC PANEL - Abnormal; Notable for the following components:      Result Value   Sodium 127 (*)    Potassium 3.3 (*)    Chloride 93 (*)    CO2 17 (*)    Glucose, Bld 346 (*)    Anion gap 17 (*)    All other components within normal limits  CBC - Abnormal; Notable for the following components:   WBC 13.6 (*)    RBC 5.86 (*)    MCV 76.1 (*)    MCH 23.9 (*)    RDW 19.6 (*)    Platelets 554 (*)    All other components within normal limits  URINALYSIS, ROUTINE W REFLEX MICROSCOPIC - Abnormal; Notable for the following components:   Color, Urine BROWN (*)    APPearance TURBID (*)    Glucose, UA >=500 (*)    Hgb urine dipstick LARGE (*)    Bilirubin Urine SMALL (*)    Ketones, ur >80 (*)    Protein, ur 100 (*)    All other components within normal limits  URINALYSIS, MICROSCOPIC (REFLEX) - Abnormal; Notable for the following components:   Bacteria, UA FEW (*)    All other components within normal limits  HEPATIC FUNCTION PANEL - Abnormal; Notable for the  following components:   Total Protein 9.2 (*)    Total Bilirubin 1.4 (*)    All other components within normal limits  BASIC METABOLIC PANEL - Abnormal; Notable for the following components:   Sodium 133 (*)    Potassium 2.8 (*)    CO2 19 (*)    Glucose, Bld 152 (*)    Calcium 8.8 (*)    All other components within normal limits  CBG MONITORING, ED - Abnormal; Notable for the following components:   Glucose-Capillary 352 (*)    All other components within normal limits  CBG MONITORING, ED - Abnormal; Notable for the following components:   Glucose-Capillary 286 (*)    All other components within normal limits  I-STAT VENOUS BLOOD GAS, ED - Abnormal; Notable for the following components:   pH, Ven 7.437 (*)    pCO2, Ven 26.8 (*)    pO2, Ven 20.0 (*)    Bicarbonate 18.1 (*)    TCO2 19 (*)    Acid-base deficit 5.0 (*)    Sodium 131 (*)    Potassium 3.4 (*)    All other components within normal limits  PREGNANCY, URINE  LIPASE, BLOOD  CBG MONITORING, ED   EKG EKG Interpretation  Date/Time:  Friday February 06 2020 21:03:35 EDT Ventricular Rate:  69 PR Interval:    QRS Duration: 91 QT Interval:  393 QTC Calculation: 421 R Axis:   11 Text Interpretation: Sinus rhythm No significant change since last tracing Confirmed by Blanchie Dessert [phone]) on 02/06/2020 9:13:55 PM  Radiology No results found.  Procedures Procedures (including critical care time)  Medications Ordered in ED Medications  lactated ringers infusion ( Intravenous New Bag/Given 02/06/20 2159)  potassium chloride SA (KLOR-CON) CR tablet 40 mEq (has no  administration in time range)  ondansetron (ZOFRAN) injection 4 mg (4 mg Intravenous Given 02/06/20 1742)  lactated ringers bolus 2,000 mL ( Intravenous Stopped 02/06/20 2032)  morphine 4 MG/ML injection 4 mg (4 mg Intravenous Given 02/06/20 1813)  ondansetron (ZOFRAN) injection 4 mg (4 mg Intravenous Given 02/06/20 2233)  haloperidol lactate (HALDOL) injection 5 mg  (5 mg Intravenous Given 02/06/20 2202)  insulin aspart (novoLOG) injection 5 Units (5 Units Intravenous Given 02/06/20 2201)   ED Course  I have reviewed the triage vital signs and the nursing notes.  Pertinent labs & imaging results that were available during my care of the patient were reviewed by me and considered in my medical decision making (see chart for details).    MDM Rules/Calculators/A&P                          Ms. Burback is a 44 year old female with past medical history significant for T2DM who presents from urgent care with complaints of nausea, vomiting, and abdominal pain. Upon arrival to the ED, she is tachypneic (24) and hypertensive (155/121), but otherwise hemodynamically stable. Initial labs: BMP Glu 346, Na 127, K 3.3, CO2 17, Anion gap 17; CBC WBC 13.6, PLT 554; UA >500 glucose, >80 ketones, negative nitrites and leukocytes; VBG pH 7.437, pCO2 26.8. Her hyperglycemia is likely secondary to nonadherence to metformin for several days in the setting of nausea, vomiting and inadequate PO intake. Although she has a mildly depressed bicarbonate level with an anion gap, her VBG is reassuring that she is not acidotic given her pH of 7.437. Lipase is within normal limits and hepatic function panel is unremarkable.   In the ED, she has received Zofran for nausea, morphine for abdominal pain and a 2L bolus of LR. She continues to be significantly distressed by her symptoms despite hemodynamic stability, improvement of her hyperglycemia, and no new acute findings. She previously received routine abilify injections for schizoaffective disorder, bipolar type, however she has been off of this medication regimen since January. Given her significant agitation and anxiety regarding her condition, we will obtain EKG to evaluate her QT interval and administer haldol for her anxiety/agitation if QT is not prolonged. Otherwise, we will continue IVF, IV zofran and administer 5u insulin for persistent  hyperglycemia, most recently 286.  Repeat BMP reveals patient's anion gap has closed (13), glucose at 152, and potasssium of 2.8. We have provided 12m of potassium repletion. She states that she feels much better and has no current complaints. She is stable for discharge and has no further complaints or concerns.  Final Clinical Impression(s) / ED Diagnoses Final diagnoses:  Hyperglycemia    Rx / DC Orders ED Discharge Orders         Ordered    metoCLOPramide (REGLAN) 10 MG tablet  3 times daily with meals     Discontinue     02/06/20 2344           JPaulla Dolly MD 02/06/20 2349    PBlanchie Dessert MD 02/07/20 0003

## 2020-02-06 NOTE — ED Triage Notes (Signed)
Pt c/o n/v since Monday after receiving Moderna covid injection on Monday evening. Pt reports she experienced severe pain/cramping to extremities, chills, n/v after injection. Pt states she has not been tolerating eating foods. Her CBG was 412 on Tuesday when she was evaluated by EMS. Pt states she has not taken her insulin or metformin since Monday. Amy Tasia Catchings in to explain to pt that she needs to go to ER STAT for hyperglycemia and possible DKA 2/2 not able to effectively tx pt. Explained to pt that [facility] has less wait time. Patient is being discharged from the Urgent Care and sent to the Emergency Department via POV. Per Cathlean Sauer, PA. patient is in need of higher level of care due to probable DKA. Patient is aware and verbalizes understanding of plan of care.  Vitals:   02/06/20 1555  BP: (!) 139/94  Pulse: (!) 110  Resp: (!) 26  Temp: 98.3 F (36.8 C)  SpO2: 98%

## 2020-02-06 NOTE — ED Provider Notes (Signed)
44 year old female with history of IDDM, HTN comes in for 5-day history of nausea, vomiting, extremity cramping, chills.  This for started after maternal Covid injection.  She has not been able to tolerate oral intake.  States CBG was 412 4 days ago evaluated by EMS.  States has not used insulin, or Metformin since 5 days ago.  She was at the ED 02/04/2020, lab work drawn, but left prior to being seen due to wait time.  BMP at the time showed anion gap of 19.  Patient continues with nausea, vomiting, hyperglycemia.  At triage, patient tachycardic at 110, tachypneic at 26.  Kussmaul breathing on exam.  She is able to ambulate on own, though with discomfort.  Discussed history with lab work consistent with DKA requiring further treatment at the ED. EMS offered, daughter comfortable driving patient to ED for further evaluation. Discussed to call 911 if symptoms worsen during transport. Patient expresses understanding. Discharged in stable condition to the ED for further evaluation.    Ok Edwards, PA-C 02/06/20 1614

## 2020-02-06 NOTE — Discharge Instructions (Addendum)
44 year old female with history of IDDM comes in with nausea/vomiting/ muscle cramps. CBG 381 today.   ED lab drawn 02/04/2020 shows anion gap of 19.  WBC 10.6  Given anion gap, patient discharged to the ED for further evaluation.

## 2020-02-07 ENCOUNTER — Telehealth (HOSPITAL_BASED_OUTPATIENT_CLINIC_OR_DEPARTMENT_OTHER): Payer: Self-pay | Admitting: Emergency Medicine

## 2020-02-07 MED ORDER — POTASSIUM CHLORIDE 20 MEQ/15ML (10%) PO SOLN
40.0000 meq | Freq: Once | ORAL | Status: AC
  Administered 2020-02-07: 40 meq via ORAL
  Filled 2020-02-07: qty 30

## 2020-02-08 ENCOUNTER — Other Ambulatory Visit: Payer: Self-pay

## 2020-02-08 ENCOUNTER — Encounter (HOSPITAL_BASED_OUTPATIENT_CLINIC_OR_DEPARTMENT_OTHER): Payer: Self-pay | Admitting: *Deleted

## 2020-02-08 ENCOUNTER — Emergency Department (HOSPITAL_BASED_OUTPATIENT_CLINIC_OR_DEPARTMENT_OTHER)
Admission: EM | Admit: 2020-02-08 | Discharge: 2020-02-08 | Disposition: A | Discharge status: Home / Self Care | Payer: Medicaid Other | Attending: Emergency Medicine | Admitting: Emergency Medicine

## 2020-02-08 DIAGNOSIS — M5412 Radiculopathy, cervical region: Secondary | ICD-10-CM | POA: Diagnosis not present

## 2020-02-08 DIAGNOSIS — R200 Anesthesia of skin: Secondary | ICD-10-CM | POA: Diagnosis present

## 2020-02-08 DIAGNOSIS — F17210 Nicotine dependence, cigarettes, uncomplicated: Secondary | ICD-10-CM | POA: Insufficient documentation

## 2020-02-08 DIAGNOSIS — Z79899 Other long term (current) drug therapy: Secondary | ICD-10-CM | POA: Diagnosis not present

## 2020-02-08 DIAGNOSIS — E010 Iodine-deficiency related diffuse (endemic) goiter: Secondary | ICD-10-CM | POA: Insufficient documentation

## 2020-02-08 DIAGNOSIS — E119 Type 2 diabetes mellitus without complications: Secondary | ICD-10-CM | POA: Insufficient documentation

## 2020-02-08 DIAGNOSIS — Z794 Long term (current) use of insulin: Secondary | ICD-10-CM | POA: Diagnosis not present

## 2020-02-08 DIAGNOSIS — I10 Essential (primary) hypertension: Secondary | ICD-10-CM | POA: Insufficient documentation

## 2020-02-08 LAB — CBG MONITORING, ED: Glucose-Capillary: 265 mg/dL — ABNORMAL HIGH (ref 70–99)

## 2020-02-08 MED ORDER — METHOCARBAMOL 500 MG PO TABS
500.0000 mg | ORAL_TABLET | Freq: Two times a day (BID) | ORAL | 0 refills | Status: DC
Start: 2020-02-08 — End: 2020-03-10

## 2020-02-08 NOTE — Discharge Instructions (Addendum)
Gentle stretching as discussed, warm compresses for 20 minutes at a time. Follow up with your doctor for recheck as discussed. Return to ER for new or worsening symptoms.

## 2020-02-08 NOTE — ED Notes (Signed)
Dr. Melina Copa made aware of pt chief complaint and Sx

## 2020-02-08 NOTE — ED Provider Notes (Signed)
[facility]ERGENC[facility] DEPARTMENT Provider Note   CSN: [id number] Arrival date & time: 02/08/20  1316     History Chief Complaint  Patient presents with  . Numbness    [name] is a 44 y.o. female.  44 year old female presents with complaint of a pins-and-needles sensation in her left arm.  Patient states that she first noticed symptoms this morning upon waking at 7 AM, located through the palmar aspect of her hand affecting her thumb and fingers 2, 3, and 4, radiates up the forearm to the medial upper arm.  Denies any numbness to the dorsum of the hand or arm.  Patient states that she is able to move her arm normally, no weakness in this arm.  Denies any other numbness or tingling to the rest of her body.  Patient states that she has been sleeping on her right side because she had her maternal vaccine earlier this week in her left arm.  Patient states she has a slight tightness in the left side of her neck posteriorly and reports some swelling to her anterior neck.  She denies changes in vision, speech, gait.  Patient is feeling well otherwise, no other complaints or concerns.        Past Medical History:  Diagnosis Date  . Abnormal Pap smear   . Bipolar 1 disorder ([place])   . Diabetes mellitus without complication ([place])   . Fibroids   . Hypertension   . IBS (irritable bowel syndrome)     Patient Active Problem List   Diagnosis Date Noted  . Essential hypertension 04/08/2019  . Class 2 severe obesity due to excess calories with serious comorbidity and body mass index (BMI) of 39.0 to 39.9 in adult ([place]) 04/08/2019  . Tobacco dependence 04/08/2019  . Schizoaffective disorder, bipolar type ([place]) 05/18/2018  . Diabetes mellitus ([place]) 08/10/2016  . Bipolar disorder, curr episode mixed, severe, with psychotic features ([place]) 08/07/2016  . Cannabis use disorder, moderate, dependence ([place]) 08/07/2016  . CHOLECYSTITIS, UNSPECIFIED 06/12/2008  . BILIARY COLIC  51/88/4166  . CERUMEN IMPACTION, BILATERAL 01/17/2008  . PHARYNGITIS, VIRAL 01/17/2008  . NECK PAIN 01/17/2008    Past Surgical History:  Procedure Laterality Date  . CERVICAL BIOPSY    . CHOLECYSTECTOMY    . TUBAL LIGATION       OB History    Gravida  7   Para  2   Term  2   Preterm      AB  5   Living  2     SAB      TAB  4   Ectopic  1   Multiple      Live Births              Family History  Problem Relation Age of Onset  . Diabetes Father   . Diabetes Paternal Grandmother   . Diabetes Maternal Grandmother   . Mental illness Cousin     Social History   Tobacco Use  . Smoking status: Current Every Day Smoker    Packs/day: 1.00    Years: 3.00    Pack years: 3.00    Types: Cigarettes  . Smokeless tobacco: Never Used  Vaping Use  . Vaping Use: Never used  Substance Use Topics  . Alcohol use: Yes    Comment: occasional  . Drug use: Yes    Types: Marijuana    Comment: CBD gummies    Home Medications Prior to Admission medications   Medication  Sig Start Date End Date Taking? Authorizing Provider  ARIPiprazole ER (ABILIFY MAINTENA) 400 MG SRER injection Inject 400 mg into the muscle every 28 (twenty-eight) days.    [provider]  atorvastatin (LIPITOR) 20 MG tablet Take 1 tablet (20 mg total) by mouth daily. 01/20/20   [name] Bang, DO  Blood Glucose Monitoring Suppl (TRUE METRIX METER) w/Device KIT Use as directed 04/09/19   Ladell Pier, MD  glucose blood (TRUE METRIX BLOOD GLUCOSE TEST) test strip Use to check fasting FSBS daily. Dx: E11.42 01/20/20   [name] Bang, DO  hydrOXYzine (ATARAX/VISTARIL) 10 MG tablet Take 10 mg by mouth at bedtime as needed and may repeat dose one time if needed.    [provider]  Insulin Pen Needle (PEN NEEDLES) 31G X 8 MM MISC Use to inject insulin once daily. E11.42 01/20/20   [name] Bang, DO  lisinopril (ZESTRIL) 5 MG tablet Take 1 tablet (5 mg  total) by mouth daily. 01/20/20   [name] Bang, DO  metFORMIN (GLUCOPHAGE) 1000 MG tablet Take 1 tablet (1,000 mg total) by mouth 2 (two) times daily with a meal. 01/20/20   [name] Bang, DO  methocarbamol (ROBAXIN) 500 MG tablet Take 1 tablet (500 mg total) by mouth 2 (two) times daily. 02/08/20   Tacy Learn, PA-C  metoCLOPramide (REGLAN) 10 MG tablet Take 1 tablet (10 mg total) by mouth 3 (three) times daily with meals. 02/06/20 02/05/21  Paulla Dolly, MD  TRUEplus Lancets 28G MISC Use to check fasting FSBS daily. Dx: E11.42 01/20/20   [name] Bang, DO    Allergies    Patient has no known allergies.  Review of Systems   Review of Systems  Constitutional: Negative for fever.  HENT: Negative for trouble swallowing and voice change.   Musculoskeletal: Positive for neck stiffness. Negative for arthralgias, back pain, gait problem, joint swelling and neck pain.  Skin: Negative for rash and wound.  Allergic/Immunologic: Positive for immunocompromised state.  Neurological: Negative for dizziness, facial asymmetry, speech difficulty and weakness.  Hematological: Negative for adenopathy.  Psychiatric/Behavioral: Negative for confusion.  All other systems reviewed and are negative.   Physical Exam Updated Vital Signs BP (!) 148/83 (BP Location: Right Arm)   Pulse 99   Temp 98.3 F (36.8 C) (Oral)   Resp 16   Ht 6' (1.829 m)   Wt 129 kg   LMP 02/03/2020   SpO2 100%   BMI 38.57 kg/m   Physical Exam Vitals and nursing note reviewed.  Constitutional:      General: She is not in acute distress.    Appearance: She is well-developed. She is not diaphoretic.  HENT:     Head: Normocephalic and atraumatic.     Mouth/Throat:     Mouth: Mucous membranes are moist.  Eyes:     Extraocular Movements: Extraocular movements intact.     Pupils: Pupils are equal, round, and reactive to light.  Neck:     Thyroid: Thyromegaly present. No thyroid  tenderness.     Comments: Non tender thyroid enlargement  Cardiovascular:     Rate and Rhythm: Normal rate and regular rhythm.     Pulses: Normal pulses.     Heart sounds: Normal heart sounds.  Pulmonary:     Effort: Pulmonary effort is normal.     Breath sounds: Normal breath sounds.  Musculoskeletal:     Cervical back: Neck supple.  Skin:    General: Skin is warm  and dry.     Capillary Refill: Capillary refill takes less than 2 seconds.  Neurological:     Mental Status: She is alert and oriented to person, place, and time.     Cranial Nerves: Cranial nerves are intact. No cranial nerve deficit or facial asymmetry.     Motor: No weakness.     Gait: Gait normal.     Deep Tendon Reflexes: Reflexes normal.     Reflex Scores:      Tricep reflexes are 1+ on the right side and 1+ on the left side.      Bicep reflexes are 1+ on the right side and 1+ on the left side.      Brachioradialis reflexes are 1+ on the right side and 1+ on the left side.    Comments: Reports pins/needles/ "asleep" sensation to palm of left hand thumb-4th finger extending up left forearm anteriorly to left upper arm with slight discomfort with deep palpation left paraspinous   Psychiatric:        Behavior: Behavior normal.     ED Results / Procedures / Treatments   Labs (all labs ordered are listed, but only abnormal results are displayed) Labs Reviewed  CBG MONITORING, ED - Abnormal; Notable for the following components:      Result Value   Glucose-Capillary 265 (*)    All other components within normal limits    EKG None  Radiology No results found.  Procedures Procedures (including critical care time)  Medications Ordered in ED Medications - No data to display  ED Course  I have reviewed the triage vital signs and the nursing notes.  Pertinent labs & imaging results that were available during my care of the patient were reviewed by me and considered in my medical decision making (see chart  for details).  Clinical Course as of Feb 08 1527  Sun Feb 07, 6050  5248 44 year old female with pins-and-needles sensation in her left hand extending up arm upon waking this morning at 7 AM.  On exam, has very defined area of altered sensation without changes in arm strength, grip, reflexes.  No other deficits noted. Discussed with Dr. Melina Copa, ER attending, symptoms felt to be radicular in nature following cervical pattern.  Recommend warm compresses to neck, given Robaxin, recommend gentle stretching.  Return to ER if symptoms progress or new symptoms develop otherwise follow PCP. Also reports feeling like the front of her neck is swollen, is found to have an enlarged nontender thyroid.  Patient reports family history of thyroid dysfunction and will follow up with her PCP for work-up.   [LM]    Clinical Course User Index [LM] Roque Lias   MDM Rules/Calculators/A&P                          Final Clinical Impression(s) / ED Diagnoses Final diagnoses:  Cervical radiculopathy  Thyromegaly    Rx / DC Orders ED Discharge Orders         Ordered    methocarbamol (ROBAXIN) 500 MG tablet  2 times daily     Discontinue  Reprint     02/08/20 1440           Tacy Learn, PA-C 02/08/20 1528    Hayden Rasmussen, MD 02/08/20 [phone]

## 2020-02-08 NOTE — ED Triage Notes (Signed)
Pt reports numbness from left shoulder to hand upon waking this am. All fingers are numb except pinky. Grips strong and equal, facial symmetry present, no drift, speech clear. Denies pain

## 2020-02-17 ENCOUNTER — Ambulatory Visit
Admission: EM | Admit: 2020-02-17 | Discharge: 2020-02-17 | Disposition: A | Discharge status: Home / Self Care | Payer: Medicaid Other | Attending: Physician Assistant | Admitting: Physician Assistant

## 2020-02-17 DIAGNOSIS — B3731 Acute candidiasis of vulva and vagina: Secondary | ICD-10-CM

## 2020-02-17 DIAGNOSIS — B373 Candidiasis of vulva and vagina: Secondary | ICD-10-CM

## 2020-02-17 DIAGNOSIS — L299 Pruritus, unspecified: Secondary | ICD-10-CM | POA: Diagnosis not present

## 2020-02-17 MED ORDER — HYDROXYZINE HCL 25 MG PO TABS
25.0000 mg | ORAL_TABLET | Freq: Four times a day (QID) | ORAL | 0 refills | Status: AC
Start: 2020-02-17 — End: 2020-02-24

## 2020-02-17 MED ORDER — SARNA 0.5-0.5 % EX LOTN
1.0000 | TOPICAL_LOTION | CUTANEOUS | 0 refills | Status: DC | PRN
Start: 2020-02-17 — End: 2020-03-10

## 2020-02-17 MED ORDER — FLUCONAZOLE 150 MG PO TABS
150.0000 mg | ORAL_TABLET | Freq: Every day | ORAL | 0 refills | Status: DC
Start: 2020-02-17 — End: 2020-03-10

## 2020-02-17 NOTE — ED Provider Notes (Signed)
[facility]    CSN: [id number] Arrival date & time: 02/17/20  1313      History   Chief Complaint Chief Complaint  Patient presents with  . itching all over    HPI [name] is a 44 y.o. female.   44 year old female comes in for 5 day history of pruritis throughout the body. She was recently treated for hyperglycemia and early DKA at the ED 02/06/2020, denies itching at the time. States CBG has been better controlled, running in 200s. Denies insect bites, new hygiene product. Does endorse significant itching to the vaginal area without significant discharge, urinary symptoms. No fevers.      Past Medical History:  Diagnosis Date  . Abnormal Pap smear   . Bipolar 1 disorder ([place])   . Diabetes mellitus without complication ([place])   . Fibroids   . Hypertension   . IBS (irritable bowel syndrome)     Patient Active Problem List   Diagnosis Date Noted  . Essential hypertension 04/08/2019  . Class 2 severe obesity due to excess calories with serious comorbidity and body mass index (BMI) of 39.0 to 39.9 in adult ([place]) 04/08/2019  . Tobacco dependence 04/08/2019  . Schizoaffective disorder, bipolar type ([place]) 05/18/2018  . Diabetes mellitus ([place]) 08/10/2016  . Bipolar disorder, curr episode mixed, severe, with psychotic features ([place]) 08/07/2016  . Cannabis use disorder, moderate, dependence ([place]) 08/07/2016  . CHOLECYSTITIS, UNSPECIFIED 06/12/2008  . BILIARY COLIC 42/68/3419  . CERUMEN IMPACTION, BILATERAL 01/17/2008  . PHARYNGITIS, VIRAL 01/17/2008  . NECK PAIN 01/17/2008    Past Surgical History:  Procedure Laterality Date  . CERVICAL BIOPSY    . CHOLECYSTECTOMY    . TUBAL LIGATION      OB History    Gravida  7   Para  2   Term  2   Preterm      AB  5   Living  2     SAB      TAB  4   Ectopic  1   Multiple      Live Births               Home Medications    Prior to Admission medications   Medication Sig Start Date  End Date Taking? Authorizing Provider  camphor-menthol [facility] Ace) lotion Apply 1 application topically as needed for itching. 02/17/20   Tasia Catchings, Rajesh Wyss V, PA-C  fluconazole (DIFLUCAN) 150 MG tablet Take 1 tablet (150 mg total) by mouth daily. Take second dose 72 hours later if symptoms still persists. 02/17/20   Tasia Catchings, Atoya Andrew V, PA-C  glucose blood (TRUE METRIX BLOOD GLUCOSE TEST) test strip Use to check fasting FSBS daily. Dx: E11.42 01/20/20   [name] Bang, DO  hydrOXYzine (ATARAX/VISTARIL) 25 MG tablet Take 1 tablet (25 mg total) by mouth every 6 (six) hours for 7 days. 02/17/20 02/24/20  Tasia Catchings, Debbera Wolken V, PA-C  lisinopril (ZESTRIL) 5 MG tablet Take 1 tablet (5 mg total) by mouth daily. 01/20/20   [name] Bang, DO  metFORMIN (GLUCOPHAGE) 1000 MG tablet Take 1 tablet (1,000 mg total) by mouth 2 (two) times daily with a meal. 01/20/20   [name] Bang, DO  methocarbamol (ROBAXIN) 500 MG tablet Take 1 tablet (500 mg total) by mouth 2 (two) times daily. 02/08/20   Tacy Learn, PA-C  metoCLOPramide (REGLAN) 10 MG tablet Take 1 tablet (10 mg total) by mouth 3 (three) times daily with meals. 02/06/20 02/05/21  Wynetta Emery,  Mila Homer, MD    Family History Family History  Problem Relation Age of Onset  . Diabetes Father   . Diabetes Paternal Grandmother   . Diabetes Maternal Grandmother   . Mental illness Cousin     Social History Social History   Tobacco Use  . Smoking status: Current Every Day Smoker    Packs/day: 1.00    Years: 3.00    Pack years: 3.00    Types: Cigarettes, E-cigarettes  . Smokeless tobacco: Never Used  Vaping Use  . Vaping Use: Never used  Substance Use Topics  . Alcohol use: Yes    Comment: occasional  . Drug use: Yes    Types: Marijuana    Comment: CBD gummies     Allergies   Patient has no known allergies.   Review of Systems Review of Systems  Reason unable to perform ROS: See HPI as above.     Physical Exam Triage Vital Signs ED Triage Vitals   Enc Vitals Group     BP 02/17/20 1356 (!) 142/84     Pulse Rate 02/17/20 1356 98     Resp 02/17/20 1356 16     Temp 02/17/20 1356 98.2 F (36.8 C)     Temp Source 02/17/20 1356 Oral     SpO2 02/17/20 1356 98 %     Weight --      Height --      Head Circumference --      Peak Flow --      Pain Score 02/17/20 1406 0     Pain Loc --      Pain Edu? --      Excl. in [place]? --    No data found.  Updated Vital Signs BP (!) 142/84 (BP Location: Left Arm)   Pulse 98   Temp 98.2 F (36.8 C) (Oral)   Resp 16   LMP 02/03/2020   SpO2 98%   Visual Acuity Right Eye Distance:   Left Eye Distance:   Bilateral Distance:    Right Eye Near:   Left Eye Near:    Bilateral Near:     Physical Exam Constitutional:      General: She is not in acute distress.    Appearance: Normal appearance. She is well-developed. She is not toxic-appearing or diaphoretic.  HENT:     Head: Normocephalic and atraumatic.  Eyes:     Conjunctiva/sclera: Conjunctivae normal.     Pupils: Pupils are equal, round, and reactive to light.  Pulmonary:     Effort: Pulmonary effort is normal. No respiratory distress.  Musculoskeletal:     Cervical back: Normal range of motion and neck supple.  Skin:    General: Skin is warm and dry.     Comments: Patient scratching throughout exam. No obvious rashes noted.  Neurological:     Mental Status: She is alert and oriented to person, place, and time.      UC Treatments / Results  Labs (all labs ordered are listed, but only abnormal results are displayed) Labs Reviewed - No data to display  EKG   Radiology No results found.  Procedures Procedures (including critical care time)  Medications Ordered in UC Medications - No data to display  Initial Impression / Assessment and Plan / UC Course  I have reviewed the triage vital signs and the nursing notes.  Pertinent labs & imaging results that were available during my care of the patient were reviewed by me  and considered in  my medical decision making (see chart for details).    ? Pruritis due to hyperglycemia. Will cover for yeast vaginitis with diflucan. Hydroxyzine and SARNA lotion for itching. Return precautions given.  Final Clinical Impressions(s) / UC Diagnoses   Final diagnoses:  Pruritus  Yeast vaginitis   ED Prescriptions    Medication Sig Dispense Auth. Provider   fluconazole (DIFLUCAN) 150 MG tablet Take 1 tablet (150 mg total) by mouth daily. Take second dose 72 hours later if symptoms still persists. 2 tablet Euna Armon V, PA-C   hydrOXYzine (ATARAX/VISTARIL) 25 MG tablet Take 1 tablet (25 mg total) by mouth every 6 (six) hours for 7 days. 28 tablet Julus Kelley V, PA-C   camphor-menthol [facility]) lotion Apply 1 application topically as needed for itching. 222 mL Ok Edwards, PA-C     PDMP not reviewed this encounter.   Ok Edwards, PA-C 02/17/20 1421

## 2020-02-17 NOTE — ED Triage Notes (Signed)
Pt states had an allergic reaction to the covid vaccine last Monday with itching all over. States seen and tx'd in the ED with no relief. States using benadryl now with no relief.

## 2020-02-17 NOTE — Discharge Instructions (Signed)
Hydroxyzine and SARNA lotion to help with itching. Diflucan to cover for yeast. Continue to monitor your blood sugar as this can be causing symptoms. Please follow up with PCP for evaluation and management of your diabetes.

## 2020-02-25 ENCOUNTER — Other Ambulatory Visit (INDEPENDENT_AMBULATORY_CARE_PROVIDER_SITE_OTHER): Payer: Medicaid Other

## 2020-02-25 DIAGNOSIS — E1142 Type 2 diabetes mellitus with diabetic polyneuropathy: Secondary | ICD-10-CM

## 2020-02-25 DIAGNOSIS — I10 Essential (primary) hypertension: Secondary | ICD-10-CM | POA: Diagnosis not present

## 2020-02-25 DIAGNOSIS — Z1159 Encounter for screening for other viral diseases: Secondary | ICD-10-CM

## 2020-02-25 DIAGNOSIS — E782 Mixed hyperlipidemia: Secondary | ICD-10-CM | POA: Diagnosis not present

## 2020-02-26 ENCOUNTER — Other Ambulatory Visit: Payer: Self-pay | Admitting: Internal Medicine

## 2020-02-26 DIAGNOSIS — E785 Hyperlipidemia, unspecified: Secondary | ICD-10-CM

## 2020-02-26 LAB — CBC WITH DIFFERENTIAL/PLATELET
Basophils Absolute: 0.1 10*3/uL (ref 0.0–0.2)
Basos: 1 %
EOS (ABSOLUTE): 0.2 10*3/uL (ref 0.0–0.4)
Eos: 2 %
Hematocrit: 38.5 % (ref 34.0–46.6)
Hemoglobin: 11.9 g/dL (ref 11.1–15.9)
Immature Grans (Abs): 0 10*3/uL (ref 0.0–0.1)
Immature Granulocytes: 0 %
Lymphocytes Absolute: 2.6 10*3/uL (ref 0.7–3.1)
Lymphs: 30 %
MCH: 24.1 pg — ABNORMAL LOW (ref 26.6–33.0)
MCHC: 30.9 g/dL — ABNORMAL LOW (ref 31.5–35.7)
MCV: 78 fL — ABNORMAL LOW (ref 79–97)
Monocytes Absolute: 0.5 10*3/uL (ref 0.1–0.9)
Monocytes: 6 %
Neutrophils Absolute: 5.2 10*3/uL (ref 1.4–7.0)
Neutrophils: 61 %
Platelets: 449 10*3/uL (ref 150–450)
RBC: 4.94 x10E6/uL (ref 3.77–5.28)
RDW: 17.5 % — ABNORMAL HIGH (ref 11.7–15.4)
WBC: 8.5 10*3/uL (ref 3.4–10.8)

## 2020-02-26 LAB — LIPID PANEL
Chol/HDL Ratio: 5.2 ratio — ABNORMAL HIGH (ref 0.0–4.4)
Cholesterol, Total: 266 mg/dL — ABNORMAL HIGH (ref 100–199)
HDL: 51 mg/dL (ref >39)
LDL Chol Calc (NIH): 191 mg/dL — ABNORMAL HIGH (ref 0–99)
Triglycerides: 132 mg/dL (ref 0–149)
VLDL Cholesterol Cal: 24 mg/dL (ref 5–40)

## 2020-02-26 LAB — COMPREHENSIVE METABOLIC PANEL
ALT: 26 IU/L (ref 0–32)
AST: 14 IU/L (ref 0–40)
Albumin/Globulin Ratio: 1.2 (ref 1.2–2.2)
Albumin: 3.9 g/dL (ref 3.8–4.8)
Alkaline Phosphatase: 95 IU/L (ref 48–121)
BUN/Creatinine Ratio: 14 (ref 9–23)
BUN: 10 mg/dL (ref 6–24)
Bilirubin Total: 0.3 mg/dL (ref 0.0–1.2)
CO2: 22 mmol/L (ref 20–29)
Calcium: 9.5 mg/dL (ref 8.7–10.2)
Chloride: 98 mmol/L (ref 96–106)
Creatinine, Ser: 0.72 mg/dL (ref 0.57–1.00)
GFR calc Af Amer: 119 mL/min/{1.73_m2} (ref >59)
GFR calc non Af Amer: 104 mL/min/{1.73_m2} (ref >59)
Globulin, Total: 3.2 g/dL (ref 1.5–4.5)
Glucose: 311 mg/dL — ABNORMAL HIGH (ref 65–99)
Potassium: 4.7 mmol/L (ref 3.5–5.2)
Sodium: 133 mmol/L — ABNORMAL LOW (ref 134–144)
Total Protein: 7.1 g/dL (ref 6.0–8.5)

## 2020-02-26 LAB — HEMOGLOBIN A1C
Est. average glucose Bld gHb Est-mCnc: 332 mg/dL
Hgb A1c MFr Bld: 13.2 % — ABNORMAL HIGH (ref 4.8–5.6)

## 2020-02-26 LAB — HEPATITIS C ANTIBODY: Hep C Virus Ab: <0.1 s/co ratio (ref 0.0–0.9)

## 2020-02-26 MED ORDER — ASPIRIN 81 MG PO TBEC: 81.0000 mg | DELAYED_RELEASE_TABLET | Freq: Every day | ORAL | 12 refills | Status: DC

## 2020-02-26 MED ORDER — ATORVASTATIN CALCIUM 80 MG PO TABS: 80.0000 mg | ORAL_TABLET | Freq: Every day | ORAL | 3 refills | Status: DC

## 2020-03-11 ENCOUNTER — Other Ambulatory Visit: Payer: Self-pay

## 2020-03-11 ENCOUNTER — Encounter: Payer: Self-pay | Admitting: Pharmacist

## 2020-03-11 ENCOUNTER — Ambulatory Visit (INDEPENDENT_AMBULATORY_CARE_PROVIDER_SITE_OTHER): Payer: Medicaid Other | Admitting: Internal Medicine

## 2020-03-11 ENCOUNTER — Encounter: Payer: Self-pay | Admitting: Internal Medicine

## 2020-03-11 ENCOUNTER — Ambulatory Visit: Payer: Medicaid Other | Attending: Family Medicine | Admitting: Pharmacist

## 2020-03-11 DIAGNOSIS — E1142 Type 2 diabetes mellitus with diabetic polyneuropathy: Secondary | ICD-10-CM | POA: Diagnosis not present

## 2020-03-11 DIAGNOSIS — E785 Hyperlipidemia, unspecified: Secondary | ICD-10-CM | POA: Diagnosis not present

## 2020-03-11 DIAGNOSIS — I10 Essential (primary) hypertension: Secondary | ICD-10-CM

## 2020-03-11 LAB — GLUCOSE, POCT (MANUAL RESULT ENTRY): POC Glucose: 282 mg/dl — AB (ref 70–99)

## 2020-03-11 MED ORDER — METFORMIN HCL 1000 MG PO TABS: 1000.0000 mg | ORAL_TABLET | Freq: Two times a day (BID) | ORAL | 1 refills | Status: DC

## 2020-03-11 MED ORDER — GABAPENTIN 100 MG PO CAPS: 100.0000 mg | ORAL_CAPSULE | Freq: Three times a day (TID) | ORAL | 3 refills | Status: DC

## 2020-03-11 MED ORDER — ACCU-CHEK GUIDE ME W/DEVICE KIT: PACK | 0 refills | Status: DC

## 2020-03-11 MED ORDER — VICTOZA 18 MG/3ML ~~LOC~~ SOPN: PEN_INJECTOR | SUBCUTANEOUS | 2 refills | Status: DC

## 2020-03-11 MED ORDER — ATORVASTATIN CALCIUM 80 MG PO TABS: 80.0000 mg | ORAL_TABLET | Freq: Every day | ORAL | 3 refills | Status: DC

## 2020-03-11 MED ORDER — LISINOPRIL 5 MG PO TABS: 5.0000 mg | ORAL_TABLET | Freq: Every day | ORAL | 1 refills | Status: DC

## 2020-03-11 MED ORDER — ACCU-CHEK GUIDE VI STRP: ORAL_STRIP | 3 refills | Status: DC

## 2020-03-11 MED ORDER — ACCU-CHEK FASTCLIX LANCET KIT
PACK | 0 refills | Status: DC
Start: 2020-03-11 — End: 2020-03-11

## 2020-03-11 MED ORDER — ACCU-CHEK SOFTCLIX LANCETS MISC: 2 refills | Status: DC

## 2020-03-11 MED ORDER — ACCU-CHEK FASTCLIX LANCETS MISC
3 refills | Status: DC
Start: 2020-03-11 — End: 2020-03-11

## 2020-03-11 MED ORDER — TRUEPLUS PEN NEEDLES 32G X 4 MM MISC: 2 refills | Status: DC

## 2020-03-11 NOTE — Progress Notes (Signed)
Virtual Visit via Telephone Note  I connected with [name], on 03/11/2020 at 10:43 AM by telephone due to the COVID-19 pandemic and verified that I am speaking with the correct person using two identifiers.   Consent: I discussed the limitations, risks, security and privacy concerns of performing an evaluation and management service by telephone and the availability of in person appointments. I also discussed with the patient that there may be a patient responsible charge related to this service. The patient expressed understanding and agreed to proceed.   Location of Patient: Home   Location of Provider: Clinic    Persons participating in Telemedicine visit: [facility] Jeanette Caprice [facility] Dr. Juleen China      History of Present Illness: Patient has a visit to f/u on DM.   Diabetes mellitus, Type 2 Disease Monitoring             Blood Sugar Ranges: Doesn't check her blood sugars. Doesn't have the supplies. But then says she's afraid of needles so she doesn't check.              Polyuria: yes              Visual problems: no   Urine Microalbumin 16 (Oct 2020)   Last A1C: 13.2 (2 weeks ago)   Medications: Metformin 1000 mg BID  Medication Compliance: yes  Medication Side Effects             Hypoglycemia: no    Past Medical History:  Diagnosis Date  . Abnormal Pap smear   . Bipolar 1 disorder ([place])   . Diabetes mellitus without complication ([place])   . Fibroids   . Hypertension   . IBS (irritable bowel syndrome)    No Known Allergies  Current Outpatient Medications on File Prior to Visit  Medication Sig Dispense Refill  . metFORMIN (GLUCOPHAGE) 1000 MG tablet Take 1 tablet (1,000 mg total) by mouth 2 (two) times daily with a meal. 180 tablet 1  . aspirin (EC-81 ASPIRIN) 81 MG EC tablet Take 1 tablet (81 mg total) by mouth daily. Swallow whole. (Patient not taking: Reported on 03/11/2020) 30 tablet 12  . atorvastatin (LIPITOR) 80 MG tablet Take 1 tablet (80  mg total) by mouth daily. (Patient not taking: Reported on 03/11/2020) 90 tablet 3  . glucose blood (TRUE METRIX BLOOD GLUCOSE TEST) test strip Use to check fasting FSBS daily. Dx: E11.42 (Patient not taking: Reported on 03/11/2020) 100 each 2  . lisinopril (ZESTRIL) 5 MG tablet Take 1 tablet (5 mg total) by mouth daily. (Patient not taking: Reported on 03/11/2020) 90 tablet 1  . metoCLOPramide (REGLAN) 10 MG tablet Take 1 tablet (10 mg total) by mouth 3 (three) times daily with meals. (Patient not taking: Reported on 03/11/2020) 90 tablet 1   No current facility-administered medications on file prior to visit.    Observations/Objective: NAD. Speaking clearly.  Work of breathing normal.  Alert and oriented. Mood appropriate.   Assessment and Plan: 1. Type 2 diabetes mellitus with diabetic polyneuropathy, without long-term current use of insulin ([place]) Reviewed with patient that her A1c is extremely elevated. Will need additional therapy beyond Metformin. She is extremely hesitant to start injectables or insulin. She claims does not have diabetic monitoring supplies. These were sent to pharmacy---fear of needles will likely impair adequate monitoring. Was able to secure visit with Benard Halsted, PharmD today to discuss medication options as she will need another agent for glucose control.  Counseled on  Diabetic diet, my plate method, 922 minutes of moderate intensity exercise/week Blood sugar logs with fasting goals of 80-120 mg/dl, random of less than 180 and in the event of sugars less than 60 mg/dl or greater than 400 mg/dl encouraged to notify the clinic. Advised on the need for annual eye exams, annual foot exams, Pneumonia vaccine. - Blood Glucose Monitoring Suppl (ACCU-CHEK GUIDE ME) w/Device KIT; Use to check FSBS BID. Dx: E11.42  Dispense: 1 kit; Refill: 0 - glucose blood (ACCU-CHEK GUIDE) test strip; Use to check FSBS BID. Dx: E11.42  Dispense: 100 each; Refill: 3 - Lancets Misc. (ACCU-CHEK  FASTCLIX LANCET) KIT; Use to check FSBS BID. Dx: E11.42  Dispense: 1 kit; Refill: 0 - Accu-Chek FastClix Lancets MISC; Use to check FSBS BID. Dx: E11.42  Dispense: 102 each; Refill: 3  2. Hyperlipidemia, unspecified hyperlipidemia type Discussed that her ASCVD risk is >20%. Needs to pick up the statin sent to pharmacy from results a couple weeks ago as well as start ASA 81 mg daily. Would recommend aiming for 150 minutes of cardiac exercise per week. Advise to decrease animal fat intake, saturated fats, processed foods. Increase fiber rich foods such as fruits and veggies.   3. Diabetic polyneuropathy associated with type 2 diabetes mellitus ([place]) Start low dose Gabapentin. Instructed to take first tablet at nighttime and increase up to TID.  - gabapentin (NEURONTIN) 100 MG capsule; Take 1 capsule (100 mg total) by mouth 3 (three) times daily.  Dispense: 90 capsule; Refill: 3    Follow Up Instructions: Visit with PharmD today, will need 4-6 week f/u with PharmD or PCP    I discussed the assessment and treatment plan with the patient. The patient was provided an opportunity to ask questions and all were answered. The patient agreed with the plan and demonstrated an understanding of the instructions.   The patient was advised to call back or seek an in-person evaluation if the symptoms worsen or if the condition fails to improve as anticipated.     I provided 30 minutes total of non-face-to-face time during this encounter including median intraservice time, reviewing previous notes, investigations, ordering medications, medical decision making, coordinating care and patient verbalized understanding at the end of the visit.    Phill Myron, D.O. Primary Care at [facility]  03/11/2020, 10:43 AM

## 2020-03-11 NOTE — Patient Instructions (Signed)
Thank you for coming to see me today. Please do the following:  1. Continue metformin twice daily.  2. Pick-up Victoza. Return next week with your Victoza and pen needles for injection.  3. Continue checking blood sugars at home.  4. Continue making the lifestyle changes we've discussed together during our visit. Diet and exercise play a significant role in improving your blood sugars.  5. Follow-up with me in 1 week.    Hypoglycemia or low blood sugar:   Low blood sugar can happen quickly and may become an emergency if not treated right away.   While this shouldn't happen often, it can be brought upon if you skip a meal or do not eat enough. Also, if your insulin or other diabetes medications are dosed too high, this can cause your blood sugar to go to low.   Warning signs of low blood sugar include: 1. Feeling shaky or dizzy 2. Feeling weak or tired  3. Excessive hunger 4. Feeling anxious or upset  5. Sweating even when you aren't exercising  What to do if I experience low blood sugar? 1. Check your blood sugar with your meter. If lower than 70, proceed to step 2.  2. Treat with 3-4 glucose tablets or 3 packets of regular sugar. If these aren't around, you can try hard candy. Yet another option would be to drink 4 ounces of fruit juice or 6 ounces of REGULAR soda.  3. Re-check your sugar in 15 minutes. If it is still below 70, do what you did in step 2 again. If has come back up, go ahead and eat a snack or small meal at this time.

## 2020-03-11 NOTE — Progress Notes (Signed)
Patient notified of results & recommendations. Expressed understanding.

## 2020-03-11 NOTE — Progress Notes (Signed)
S:    PCP: Dr. Juleen China   No chief complaint on file.  Patient arrives in good spirits. Presents for diabetes evaluation, education, and management. Patient was referred and last seen by Primary Care Provider today.   Patient reports Diabetes was diagnosed ~2-3 yrs ago.   Family/Social History:  - FHx: DM - Tobacco: current every day smoker  - Alcohol: denies use   Insurance coverage/medication affordability: [place] Medicaid  Medication adherence reported.   Current diabetes medications include: metformin 1000 mg BID Current hypertension medications include: lisinopril 5 mg daily  Current hyperlipidemia medications include: atorvastatin 80 mg daily   Patient denies hypoglycemic events.  Patient reported dietary habits:  - Dietary non-compliant  - Admits to consuming pastries, candy and regular Sprite; also fruit juices and coffee with creamer  - Admits to eating out daily; sometimes will have salads instead of fried protein or chicken wings   Patient-reported exercise habits:  - not exercising    Patient reports nocturia (nighttime urination).  Patient reports neuropathy (nerve pain). Patient reports visual changes. Patient reports self foot exams.   Pancreatitis hx: none  Thyroid cancer hx: none     O:  POCT: 282   Lab Results  Component Value Date   HGBA1C 13.2 (H) 02/25/2020   There were no vitals filed for this visit.  Lipid Panel     Component Value Date/Time   CHOL 266 (H) 02/25/2020 0836   TRIG 132 02/25/2020 0836   HDL 51 02/25/2020 0836   CHOLHDL 5.2 (H) 02/25/2020 0836   CHOLHDL 4.1 02/28/2017 0619   VLDL 23 02/28/2017 0619   LDLCALC 191 (H) 02/25/2020 0836    Home fasting blood sugars: not checking   2 hour post-meal/random blood sugars: not checking.   Clinical Atherosclerotic Cardiovascular Disease (ASCVD): No  The 10-year ASCVD risk score Mikey Bussing DC Jr., et al., 2013) is: 20.4%   Values used to calculate the score:     Age: 44 years      Sex: Female     Is Non-Hispanic African American: Yes     Diabetic: Yes     Tobacco smoker: Yes     Systolic Blood Pressure: 841 mmHg     Is BP treated: Yes     HDL Cholesterol: 51 mg/dL     Total Cholesterol: 266 mg/dL    A/P: Diabetes longstanding currently uncontrolled. Patient with symptoms of hyperglycemia. Denies hypoglycemia. Patient is able to verbalize appropriate hypoglycemia management plan. Medication adherence reported. Control is suboptimal due to dietary indiscretion and physical inactivity.   Symptoms and A1c compelling for insulin use. Pt is interested in using a GLP-1 RA to help with weight loss and is adamant that she does not wish to start insulin at this time. Additionally, she endorses fear of needles. We had a lengthy discussion regarding DM, pathophysiology, and its role in negatively impacting morbidity and mortality. After this, pt is amenable to beginning injectable therapy but wishes to try Victoza first. She agrees to pick-up her Victoza and return to clinic. I will administer her first injection in clinic.   -Started Victoza. Pt instructed to bring her pen in for her first injection. We will discuss dosing at that visit.   -Continued metformin 1000 mg BID.  -Resent testing supplies to our pharmacy.  -Extensively discussed pathophysiology of diabetes, recommended lifestyle interventions, dietary effects on blood sugar control -Counseled on s/sx of and management of hypoglycemia -Next A1C anticipated 05/2020.   ASCVD risk -  primary prevention in patient with diabetes. Last LDL is not controlled and is >190. High intensity statin therapy indicated.  - Continue atorvastatin 80 mg daily.   Written patient instructions provided.  Total time in face to face counseling 30 minutes.   Follow up Pharmacist Clinic Visit in 1 week.     Benard Halsted, PharmD, [place] [phone]

## 2020-03-15 ENCOUNTER — Ambulatory Visit: Payer: Medicaid Other | Admitting: Pharmacist

## 2020-04-01 LAB — HM DIABETES EYE EXAM

## 2020-04-18 DIAGNOSIS — E119 Type 2 diabetes mellitus without complications: Secondary | ICD-10-CM | POA: Insufficient documentation

## 2020-06-15 ENCOUNTER — Encounter: Payer: Self-pay | Admitting: Internal Medicine

## 2020-06-15 ENCOUNTER — Telehealth (INDEPENDENT_AMBULATORY_CARE_PROVIDER_SITE_OTHER): Payer: Medicaid Other | Admitting: Internal Medicine

## 2020-06-15 DIAGNOSIS — F250 Schizoaffective disorder, bipolar type: Secondary | ICD-10-CM

## 2020-06-15 DIAGNOSIS — E1142 Type 2 diabetes mellitus with diabetic polyneuropathy: Secondary | ICD-10-CM

## 2020-06-15 MED ORDER — VICTOZA 18 MG/3ML ~~LOC~~ SOPN: 1.8000 mg | PEN_INJECTOR | Freq: Every day | SUBCUTANEOUS | 2 refills | Status: DC

## 2020-06-15 MED ORDER — ARIPIPRAZOLE 10 MG PO TABS: 10.0000 mg | ORAL_TABLET | Freq: Every day | ORAL | 0 refills | Status: DC

## 2020-06-15 NOTE — Progress Notes (Signed)
Virtual Visit via Telephone Note  I connected with [name], on 06/15/2020 at 8:51 AM by telephone due to the COVID-19 pandemic and verified that I am speaking with the correct person using two identifiers.   Consent: I discussed the limitations, risks, security and privacy concerns of performing an evaluation and management service by telephone and the availability of in person appointments. I also discussed with the patient that there may be a patient responsible charge related to this service. The patient expressed understanding and agreed to proceed.   Location of Patient: Home   Location of Provider: Clinic    Persons participating in Telemedicine visit: [name] [facility] Dr. Juleen China    History of Present Illness: Patient has f/u after hospitalization in Oct with behavioral health for acute mania and psychosis. Patient has a history of schizoaffective bipolar disorder. Patient asks for refill of Abilify that was started during her hospitalization. She is established with [facility] but unable to see them for refills until Jan 12.    Past Medical History:  Diagnosis Date   Abnormal Pap smear    Bipolar 1 disorder (HCC)    Diabetes mellitus without complication (HCC)    Fibroids    Hypertension    IBS (irritable bowel syndrome)    No Known Allergies  Current Outpatient Medications on File Prior to Visit  Medication Sig Dispense Refill   ARIPiprazole (ABILIFY) 10 MG tablet Take 10 mg by mouth daily.     divalproex (DEPAKOTE) 250 MG DR tablet Take 3 tablets by mouth in the morning and at bedtime.     Accu-Chek Softclix Lancets lancets Use to check FSBS BID. Dx: E11.42 100 each 2   aspirin (EC-81 ASPIRIN) 81 MG EC tablet Take 1 tablet (81 mg total) by mouth daily. Swallow whole. (Patient not taking: Reported on 03/11/2020) 30 tablet 12   atorvastatin (LIPITOR) 80 MG tablet Take 1 tablet (80 mg total) by mouth daily. 90 tablet 3   Blood Glucose Monitoring  Suppl (ACCU-CHEK GUIDE ME) w/Device KIT Use to check FSBS BID. Dx: E11.42 1 kit 0   gabapentin (NEURONTIN) 100 MG capsule Take 1 capsule (100 mg total) by mouth 3 (three) times daily. 90 capsule 3   glucose blood (ACCU-CHEK GUIDE) test strip Use to check FSBS BID. Dx: E11.42 100 each 3   Insulin Pen Needle (TRUEPLUS PEN NEEDLES) 32G X 4 MM MISC Use as instructed to inject Victoza daily. 100 each 2   liraglutide (VICTOZA) 18 MG/3ML SOPN Start 0.27m SQ once a day for 7 days, then increase to 1.250monce a day, then increase to 1.50m52maily thereafter. 6 mL 2   lisinopril (ZESTRIL) 5 MG tablet Take 1 tablet (5 mg total) by mouth daily. 90 tablet 1   metFORMIN (GLUCOPHAGE) 1000 MG tablet Take 1 tablet (1,000 mg total) by mouth 2 (two) times daily with a meal. 180 tablet 1   No current facility-administered medications on file prior to visit.    Observations/Objective: NAD. Speaking clearly.  Work of breathing normal.  Alert and oriented. Mood appropriate.   Assessment and Plan: 1. Schizoaffective disorder, bipolar type (HCCClarenceiscussed will refill Abilify as do not want patient to have new onset psychosis or mania without medication, but that in near future would be more appropriate if this medication were managed by psych. Patient has f/u with psych in <1 month.  - ARIPiprazole (ABILIFY) 10 MG tablet; Take 1 tablet (10 mg total) by mouth daily.  Dispense: 30 tablet;  Refill: 0  2. Type 2 diabetes mellitus with diabetic polyneuropathy, without long-term current use of insulin (HCC) Last A1c with extremely poor control, >13% in Aug. Patient endorses compliance with medication and reports CBGs are "good". Follow up within 1-2 weeks for A1c and DM management.  - liraglutide (VICTOZA) 18 MG/3ML SOPN; Inject 1.8 mg into the skin daily.  Dispense: 6 mL; Refill: 2   Follow Up Instructions: F/u for DM    I discussed the assessment and treatment plan with the patient. The patient was provided an  opportunity to ask questions and all were answered. The patient agreed with the plan and demonstrated an understanding of the instructions.   The patient was advised to call back or seek an in-person evaluation if the symptoms worsen or if the condition fails to improve as anticipated.     I provided 9 minutes total of non-face-to-face time during this encounter including median intraservice time, reviewing previous notes, investigations, ordering medications, medical decision making, coordinating care and patient verbalized understanding at the end of the visit.    Phill Myron, D.O. Primary Care at [facility]  06/15/2020, 8:51 AM

## 2020-06-23 ENCOUNTER — Other Ambulatory Visit: Payer: Self-pay

## 2020-06-23 ENCOUNTER — Ambulatory Visit (INDEPENDENT_AMBULATORY_CARE_PROVIDER_SITE_OTHER): Payer: Medicaid Other | Admitting: Family

## 2020-06-23 ENCOUNTER — Encounter: Payer: Self-pay | Admitting: Family

## 2020-06-23 VITALS — BP 129/92 | HR 94 | Wt 253.8 lb

## 2020-06-23 DIAGNOSIS — E1142 Type 2 diabetes mellitus with diabetic polyneuropathy: Secondary | ICD-10-CM | POA: Diagnosis not present

## 2020-06-23 LAB — POCT GLYCOSYLATED HEMOGLOBIN (HGB A1C): Hemoglobin A1C: 10.5 % — AB (ref 4.0–5.6)

## 2020-06-23 LAB — GLUCOSE, POCT (MANUAL RESULT ENTRY): POC Glucose: 245 mg/dl — AB (ref 70–99)

## 2020-06-23 MED ORDER — METFORMIN HCL 1000 MG PO TABS: 1000.0000 mg | ORAL_TABLET | Freq: Two times a day (BID) | ORAL | 0 refills | Status: DC

## 2020-06-23 MED ORDER — DAPAGLIFLOZIN PROPANEDIOL 5 MG PO TABS: 5.0000 mg | ORAL_TABLET | Freq: Every day | ORAL | 0 refills | Status: DC

## 2020-06-23 NOTE — Progress Notes (Signed)
Transfer Victoza to [facility] is giving her issues about Rx

## 2020-06-23 NOTE — Progress Notes (Signed)
Patient ID: [name], female    DOB: May 14, 1976  MRN: [id number]  CC: Diabetes Follow-Up  Subjective: [name] is a 44 y.o. female who presents for diabetes follow-up.  1. DIABETES TYPE 2 FOLLOW-UP: 06/15/2020: Visit with Dr. Juleen China. Last A1C extremely poorly controlled > 13% in August. Follow-up in 1-2 weeks for A1C and diabetes management.   06/23/2020: Last A1C: 10.5% on 06/23/2020 Are you fasting today: '[]'  Yes '[x]'  No, prior to appointment ate a [employer] fried cajun chicken fillet and had lemonade to drink  Med Adherence:  '[]'  Yes    '[x]'  No, says does take Victoza daily but sometimes forgets to take both doses of Metformin Medication side effects:  '[]'  Yes    '[x]'  No Home Monitoring?  '[x]'  Yes    '[]'  No Home glucose results range: only checks blood sugar after eating and usually high 100's or low 200's Diet Adherence: '[x]'  Yes, says eats a lot of nuts, chicken, [place] Exercise: '[x]'  Yes    '[]'  No  Numbness of the feet? '[x]'  Yes, says she wasn't aware that she could take the Gabapentin up to 3 times per day and for this reason has only been taking it 1 time at night. Retinopathy hx? '[]'  Yes    '[x]'  No Last eye exam: Reports had an eye exam this year and has prescription glasses that she wears sometimes  Patient Active Problem List   Diagnosis Date Noted  . Essential hypertension 04/08/2019  . Class 2 severe obesity due to excess calories with serious comorbidity and body mass index (BMI) of 39.0 to 39.9 in adult ([place]) 04/08/2019  . Tobacco dependence 04/08/2019  . Schizoaffective disorder, bipolar type ([place]) 05/18/2018  . Diabetes mellitus ([place]) 08/10/2016  . Bipolar disorder, curr episode mixed, severe, with psychotic features ([place]) 08/07/2016  . Cannabis use disorder, moderate, dependence ([place]) 08/07/2016  . CHOLECYSTITIS, UNSPECIFIED 06/12/2008  . BILIARY COLIC 28/76/8115  . CERUMEN IMPACTION, BILATERAL 01/17/2008  . PHARYNGITIS, VIRAL 01/17/2008  . NECK PAIN 01/17/2008      Current Outpatient Medications on File Prior to Visit  Medication Sig Dispense Refill  . Accu-Chek Softclix Lancets lancets Use to check FSBS BID. Dx: E11.42 100 each 2  . ARIPiprazole (ABILIFY) 10 MG tablet Take 1 tablet (10 mg total) by mouth daily. 30 tablet 0  . aspirin (EC-81 ASPIRIN) 81 MG EC tablet Take 1 tablet (81 mg total) by mouth daily. Swallow whole. 30 tablet 12  . atorvastatin (LIPITOR) 80 MG tablet Take 1 tablet (80 mg total) by mouth daily. 90 tablet 3  . Blood Glucose Monitoring Suppl (ACCU-CHEK GUIDE ME) w/Device KIT Use to check FSBS BID. Dx: E11.42 1 kit 0  . divalproex (DEPAKOTE) 250 MG DR tablet Take 3 tablets by mouth in the morning and at bedtime.    . gabapentin (NEURONTIN) 100 MG capsule Take 1 capsule (100 mg total) by mouth 3 (three) times daily. 90 capsule 3  . glucose blood (ACCU-CHEK GUIDE) test strip Use to check FSBS BID. Dx: E11.42 100 each 3  . Insulin Pen Needle (TRUEPLUS PEN NEEDLES) 32G X 4 MM MISC Use as instructed to inject Victoza daily. 100 each 2  . liraglutide (VICTOZA) 18 MG/3ML SOPN Inject 1.8 mg into the skin daily. 6 mL 2  . lisinopril (ZESTRIL) 5 MG tablet Take 1 tablet (5 mg total) by mouth daily. 90 tablet 1   No current facility-administered medications on file prior to visit.    No Known Allergies  Social History   Socioeconomic History  . Marital status: Married    Spouse name: Not on file  . Number of children: 2  . Years of education: Not on file  . Highest education level: Master's degree (e.g., MA, MS, MEng, MEd, MSW, MBA)  Occupational History  . Not on file  Tobacco Use  . Smoking status: Current Every Day Smoker    Packs/day: 1.00    Years: 3.00    Pack years: 3.00    Types: Cigarettes, E-cigarettes  . Smokeless tobacco: Never Used  Vaping Use  . Vaping Use: Never used  Substance and Sexual Activity  . Alcohol use: Yes    Comment: occasional  . Drug use: Yes    Types: Marijuana    Comment: CBD gummies  .  Sexual activity: Not Currently  Other Topics Concern  . Not on file  Social History Narrative  . Not on file   Social Determinants of Health   Financial Resource Strain: Not on file  Food Insecurity: Not on file  Transportation Needs: Not on file  Physical Activity: Not on file  Stress: Not on file  Social Connections: Not on file  Intimate Partner Violence: Not on file    Family History  Problem Relation Age of Onset  . Diabetes Father   . Diabetes Paternal Grandmother   . Diabetes Maternal Grandmother   . Mental illness Cousin     Past Surgical History:  Procedure Laterality Date  . CERVICAL BIOPSY    . CHOLECYSTECTOMY    . TUBAL LIGATION      ROS: Review of Systems Negative except as stated above  PHYSICAL EXAM: BP (!) 129/92 (BP Location: Left Arm, Patient Position: Sitting)   Pulse 94   Wt 253 lb 12.8 oz (115.1 kg)   SpO2 97%   BMI 34.42 kg/m   Results for orders placed or performed in visit on 06/23/20  POCT glycosylated hemoglobin (Hb A1C)  Result Value Ref Range   Hemoglobin A1C 10.5 (A) 4.0 - 5.6 %   HbA1c POC (<> result, manual entry)     HbA1c, POC (prediabetic range)     HbA1c, POC (controlled diabetic range)    Glucose (CBG)  Result Value Ref Range   POC Glucose 245 (A) 70 - 99 mg/dl    Physical Exam Constitutional:      Appearance: Normal appearance.  Eyes:     Extraocular Movements: Extraocular movements intact.     Pupils: Pupils are equal, round, and reactive to light.  Cardiovascular:     Rate and Rhythm: Normal rate and regular rhythm.     Pulses: Normal pulses.     Heart sounds: Normal heart sounds.  Pulmonary:     Effort: Pulmonary effort is normal.     Breath sounds: Normal breath sounds.  Skin:    General: Skin is warm and dry.  Neurological:     General: No focal deficit present.     Mental Status: She is alert and oriented to person, place, and time.  Psychiatric:        Mood and Affect: Mood normal.        Behavior:  Behavior normal.    Diabetic foot exam was performed with the following findings:   No deformities, ulcerations, or other skin breakdown Decreased sensation with monofilament testing bilaterally.    Results for orders placed or performed in visit on 06/23/20  POCT glycosylated hemoglobin (Hb A1C)  Result Value Ref Range   Hemoglobin  A1C 10.5 (A) 4.0 - 5.6 %   HbA1c POC (<> result, manual entry)     HbA1c, POC (prediabetic range)     HbA1c, POC (controlled diabetic range)    Glucose (CBG)  Result Value Ref Range   POC Glucose 245 (A) 70 - 99 mg/dl    ASSESSMENT AND PLAN: 1. Type 2 diabetes mellitus with diabetic polyneuropathy, without long-term current use of insulin ([place]): - Hemoglobin A1C not at goal today at 10.5%, goal < 8%. Hemoglobin A1C improved from previous 13.2% on 02/25/2020. - CBG, elevated today in office at 245 and patient non-fasting. - Continue Metformin and Liraglutide as prescribed. - Liraglutide refilled prior to today's visit on 06/15/2020 and refills available on file. - Continue Gabapentin as prescribed for diabetic neuropathy. - Begin Dapagliflozin Propanediol as prescribed for added diabetes control.  - Follow-up in 4 weeks with primary physician for diabetes checkup. Write your home blood sugar results down each day and bring those results to your appointment along with your home glucose monitor. - Discussed the importance of healthy eating habits, low-carbohydrate diet, low-sugar diet, regular aerobic exercise (at least 150 minutes a week as tolerated) and medication compliance to achieve or maintain control of diabetes. - To achieve an A1C goal of less than or equal to 7.0 percent, a fasting blood sugar of 80 to 130 mg/dL and a postprandial glucose (90 to 120 minutes after a meal) less than 180 mg/dL. In the event of sugars less than 60 mg/dl or greater than 400 mg/dl please notify the clinic ASAP. It is recommended that you undergo annual eye exams and annual  foot exams. - BMP to check kidney function and electrolyte balance. - POCT glycosylated hemoglobin (Hb A1C) - Glucose (CBG) - Basic Metabolic Panel - dapagliflozin propanediol (FARXIGA) 5 MG TABS tablet; Take 1 tablet (5 mg total) by mouth daily before breakfast.  Dispense: 30 tablet; Refill: 0 - metFORMIN (GLUCOPHAGE) 1000 MG tablet; Take 1 tablet (1,000 mg total) by mouth 2 (two) times daily with a meal.  Dispense: 180 tablet; Refill: 0  Patient was given the opportunity to ask questions.  Patient verbalized understanding of the plan and was able to repeat key elements of the plan. Patient was given clear instructions to go to Emergency Department or return to medical center if symptoms don't improve, worsen, or new problems develop.The patient verbalized understanding.   Orders Placed This Encounter  Procedures  . Basic Metabolic Panel  . POCT glycosylated hemoglobin (Hb A1C)  . Glucose (CBG)     Requested Prescriptions   Signed Prescriptions Disp Refills  . dapagliflozin propanediol (FARXIGA) 5 MG TABS tablet 30 tablet 0    Sig: Take 1 tablet (5 mg total) by mouth daily before breakfast.  . metFORMIN (GLUCOPHAGE) 1000 MG tablet 180 tablet 0    Sig: Take 1 tablet (1,000 mg total) by mouth 2 (two) times daily with a meal.    Return in about 4 weeks (around 07/21/2020) for Dr. Juleen China.  Camillia Herter, NP

## 2020-06-23 NOTE — Patient Instructions (Signed)
Continue Metformin, Victoza, and Gabapentin for diabetes.   Begin Farxiga for diabetes.   Return in 1 month or sooner if needed for diabetes checkup with primary physician.   Lab today.   Diabetes Basics  Diabetes (diabetes mellitus) is a long-term (chronic) disease. It occurs when the body does not properly use sugar (glucose) that is released from food after you eat. Diabetes may be caused by one or both of these problems:  Your pancreas does not make enough of a hormone called insulin.  Your body does not react in a normal way to insulin that it makes. Insulin lets sugars (glucose) go into cells in your body. This gives you energy. If you have diabetes, sugars cannot get into cells. This causes high blood sugar (hyperglycemia). Follow these instructions at home: How is diabetes treated? You may need to take insulin or other diabetes medicines daily to keep your blood sugar in balance. Take your diabetes medicines every day as told by your doctor. List your diabetes medicines here: Diabetes medicines  Name of medicine: ______________________________ ? Amount (dose): _______________ Time (a.m./p.m.): _______________ Notes: ___________________________________  Name of medicine: ______________________________ ? Amount (dose): _______________ Time (a.m./p.m.): _______________ Notes: ___________________________________  Name of medicine: ______________________________ ? Amount (dose): _______________ Time (a.m./p.m.): _______________ Notes: ___________________________________ If you use insulin, you will learn how to give yourself insulin by injection. You may need to adjust the amount based on the food that you eat. List the types of insulin you use here: Insulin  Insulin type: ______________________________ ? Amount (dose): _______________ Time (a.m./p.m.): _______________ Notes: ___________________________________  Insulin type: ______________________________ ? Amount (dose):  _______________ Time (a.m./p.m.): _______________ Notes: ___________________________________  Insulin type: ______________________________ ? Amount (dose): _______________ Time (a.m./p.m.): _______________ Notes: ___________________________________  Insulin type: ______________________________ ? Amount (dose): _______________ Time (a.m./p.m.): _______________ Notes: ___________________________________  Insulin type: ______________________________ ? Amount (dose): _______________ Time (a.m./p.m.): _______________ Notes: ___________________________________ How do I manage my blood sugar?  Check your blood sugar levels using a blood glucose monitor as directed by your doctor. Your doctor will set treatment goals for you. Generally, you should have these blood sugar levels:  Before meals (preprandial): 80-130 mg/dL (4.4-7.2 mmol/L).  After meals (postprandial): below 180 mg/dL (10 mmol/L).  A1c level: less than 7%. Write down the times that you will check your blood sugar levels: Blood sugar checks  Time: _______________ Notes: ___________________________________  Time: _______________ Notes: ___________________________________  Time: _______________ Notes: ___________________________________  Time: _______________ Notes: ___________________________________  Time: _______________ Notes: ___________________________________  Time: _______________ Notes: ___________________________________  What do I need to know about low blood sugar? Low blood sugar is called hypoglycemia. This is when blood sugar is at or below 70 mg/dL (3.9 mmol/L). Symptoms may include:  Feeling: ? Hungry. ? Worried or nervous (anxious). ? Sweaty and clammy. ? Confused. ? Dizzy. ? Sleepy. ? Sick to your stomach (nauseous).  Having: ? A fast heartbeat. ? A headache. ? A change in your vision. ? Tingling or no feeling (numbness) around the mouth, lips, or tongue. ? Jerky movements that you cannot  control (seizure).  Having trouble with: ? Moving (coordination). ? Sleeping. ? Passing out (fainting). ? Getting upset easily (irritability). Treating low blood sugar To treat low blood sugar, eat or drink something sugary right away. If you can think clearly and swallow safely, follow the 15:15 rule:  Take 15 grams of a fast-acting carb (carbohydrate). Talk with your doctor about how much you should take.  Some fast-acting carbs are: ? Sugar tablets (glucose pills). Take  3-4 glucose pills. ? 6-8 pieces of hard candy. ? 4-6 oz (120-150 mL) of fruit juice. ? 4-6 oz (120-150 mL) of regular (not diet) soda. ? 1 Tbsp (15 mL) honey or sugar.  Check your blood sugar 15 minutes after you take the carb.  If your blood sugar is still at or below 70 mg/dL (3.9 mmol/L), take 15 grams of a carb again.  If your blood sugar does not go above 70 mg/dL (3.9 mmol/L) after 3 tries, get help right away.  After your blood sugar goes back to normal, eat a meal or a snack within 1 hour. Treating very low blood sugar If your blood sugar is at or below 54 mg/dL (3 mmol/L), you have very low blood sugar (severe hypoglycemia). This is an emergency. Do not wait to see if the symptoms will go away. Get medical help right away. Call your local emergency services (911 in the [place] Do not drive yourself to the hospital. Questions to ask your health care provider  Do I need to meet with a diabetes educator?  What equipment will I need to care for myself at home?  What diabetes medicines do I need? When should I take them?  How often do I need to check my blood sugar?  What number can I call if I have questions?  When is my next doctor's visit?  Where can I find a support group for people with diabetes? Where to find more information  American Diabetes Association: www.diabetes.org  American Association of Diabetes Educators: www.diabeteseducator.org/patient-resources Contact a doctor if:  Your  blood sugar is at or above 240 mg/dL (13.3 mmol/L) for 2 days in a row.  You have been sick or have had a fever for 2 days or more, and you are not getting better.  You have any of these problems for more than 6 hours: ? You cannot eat or drink. ? You feel sick to your stomach (nauseous). ? You throw up (vomit). ? You have watery poop (diarrhea). Get help right away if:  Your blood sugar is lower than 54 mg/dL (3 mmol/L).  You get confused.  You have trouble: ? Thinking clearly. ? Breathing. Summary  Diabetes (diabetes mellitus) is a long-term (chronic) disease. It occurs when the body does not properly use sugar (glucose) that is released from food after digestion.  Take insulin and diabetes medicines as told.  Check your blood sugar every day, as often as told.  Keep all follow-up visits as told by your doctor. This is important. This information is not intended to replace advice given to you by your health care provider. Make sure you discuss any questions you have with your health care provider. Document Revised: 03/12/2019 Document Reviewed: 09/21/2017 Elsevier Patient Education  [place].

## 2020-06-24 LAB — BASIC METABOLIC PANEL
BUN/Creatinine Ratio: 13 (ref 9–23)
BUN: 9 mg/dL (ref 6–24)
CO2: 19 mmol/L — ABNORMAL LOW (ref 20–29)
Calcium: 10.2 mg/dL (ref 8.7–10.2)
Chloride: 99 mmol/L (ref 96–106)
Creatinine, Ser: 0.67 mg/dL (ref 0.57–1.00)
GFR calc Af Amer: 125 mL/min/{1.73_m2} (ref >59)
GFR calc non Af Amer: 109 mL/min/{1.73_m2} (ref >59)
Glucose: 269 mg/dL — ABNORMAL HIGH (ref 65–99)
Potassium: 4.7 mmol/L (ref 3.5–5.2)
Sodium: 136 mmol/L (ref 134–144)

## 2020-06-29 NOTE — Progress Notes (Signed)
Please call patient with update.   Kidney function normal.  Diabetes discussed while patient was in clinic.

## 2020-07-03 ENCOUNTER — Emergency Department (HOSPITAL_BASED_OUTPATIENT_CLINIC_OR_DEPARTMENT_OTHER): Payer: Medicaid Other

## 2020-07-03 ENCOUNTER — Encounter (HOSPITAL_BASED_OUTPATIENT_CLINIC_OR_DEPARTMENT_OTHER): Payer: Self-pay | Admitting: Emergency Medicine

## 2020-07-03 ENCOUNTER — Ambulatory Visit
Admission: EM | Admit: 2020-07-03 | Discharge: 2020-07-03 | Disposition: A | Discharge status: Home / Self Care | Payer: Medicaid Other | Attending: Emergency Medicine | Admitting: Emergency Medicine

## 2020-07-03 ENCOUNTER — Other Ambulatory Visit: Payer: Self-pay

## 2020-07-03 ENCOUNTER — Emergency Department (HOSPITAL_BASED_OUTPATIENT_CLINIC_OR_DEPARTMENT_OTHER)
Admission: EM | Admit: 2020-07-03 | Discharge: 2020-07-03 | Disposition: A | Discharge status: Home / Self Care | Payer: Medicaid Other | Attending: Emergency Medicine | Admitting: Emergency Medicine

## 2020-07-03 DIAGNOSIS — R1013 Epigastric pain: Secondary | ICD-10-CM | POA: Diagnosis not present

## 2020-07-03 DIAGNOSIS — Z7982 Long term (current) use of aspirin: Secondary | ICD-10-CM | POA: Insufficient documentation

## 2020-07-03 DIAGNOSIS — E119 Type 2 diabetes mellitus without complications: Secondary | ICD-10-CM | POA: Diagnosis not present

## 2020-07-03 DIAGNOSIS — Z794 Long term (current) use of insulin: Secondary | ICD-10-CM | POA: Diagnosis not present

## 2020-07-03 DIAGNOSIS — R197 Diarrhea, unspecified: Secondary | ICD-10-CM | POA: Diagnosis not present

## 2020-07-03 DIAGNOSIS — Z79899 Other long term (current) drug therapy: Secondary | ICD-10-CM | POA: Insufficient documentation

## 2020-07-03 DIAGNOSIS — F17210 Nicotine dependence, cigarettes, uncomplicated: Secondary | ICD-10-CM | POA: Diagnosis not present

## 2020-07-03 DIAGNOSIS — I10 Essential (primary) hypertension: Secondary | ICD-10-CM | POA: Insufficient documentation

## 2020-07-03 DIAGNOSIS — R112 Nausea with vomiting, unspecified: Secondary | ICD-10-CM | POA: Diagnosis not present

## 2020-07-03 DIAGNOSIS — Z7984 Long term (current) use of oral hypoglycemic drugs: Secondary | ICD-10-CM | POA: Diagnosis not present

## 2020-07-03 DIAGNOSIS — K529 Noninfective gastroenteritis and colitis, unspecified: Secondary | ICD-10-CM | POA: Diagnosis not present

## 2020-07-03 DIAGNOSIS — F17290 Nicotine dependence, other tobacco product, uncomplicated: Secondary | ICD-10-CM | POA: Insufficient documentation

## 2020-07-03 LAB — CBC
HCT: 39.9 % (ref 36.0–46.0)
Hemoglobin: 12.9 g/dL (ref 12.0–15.0)
MCH: 24.2 pg — ABNORMAL LOW (ref 26.0–34.0)
MCHC: 32.3 g/dL (ref 30.0–36.0)
MCV: 74.7 fL — ABNORMAL LOW (ref 80.0–100.0)
Platelets: 605 10*3/uL — ABNORMAL HIGH (ref 150–400)
RBC: 5.34 MIL/uL — ABNORMAL HIGH (ref 3.87–5.11)
RDW: 16.3 % — ABNORMAL HIGH (ref 11.5–15.5)
WBC: 11.9 10*3/uL — ABNORMAL HIGH (ref 4.0–10.5)
nRBC: 0 % (ref 0.0–0.2)

## 2020-07-03 LAB — URINALYSIS, ROUTINE W REFLEX MICROSCOPIC
Bilirubin Urine: NEGATIVE
Glucose, UA: >=500 mg/dL — AB
Ketones, ur: >=80 mg/dL — AB
Leukocytes,Ua: NEGATIVE
Nitrite: NEGATIVE
Protein, ur: 100 mg/dL — AB
Specific Gravity, Urine: 1.03 (ref 1.005–1.030)
pH: 6 (ref 5.0–8.0)

## 2020-07-03 LAB — URINALYSIS, MICROSCOPIC (REFLEX): RBC / HPF: >50 RBC/hpf (ref 0–5)

## 2020-07-03 LAB — COMPREHENSIVE METABOLIC PANEL
ALT: 20 U/L (ref 0–44)
AST: 14 U/L — ABNORMAL LOW (ref 15–41)
Albumin: 4.2 g/dL (ref 3.5–5.0)
Alkaline Phosphatase: 90 U/L (ref 38–126)
Anion gap: 14 (ref 5–15)
BUN: 20 mg/dL (ref 6–20)
CO2: 19 mmol/L — ABNORMAL LOW (ref 22–32)
Calcium: 10.1 mg/dL (ref 8.9–10.3)
Chloride: 95 mmol/L — ABNORMAL LOW (ref 98–111)
Creatinine, Ser: 0.83 mg/dL (ref 0.44–1.00)
GFR, Estimated: >60 mL/min (ref >60)
Glucose, Bld: 355 mg/dL — ABNORMAL HIGH (ref 70–99)
Potassium: 3.6 mmol/L (ref 3.5–5.1)
Sodium: 128 mmol/L — ABNORMAL LOW (ref 135–145)
Total Bilirubin: 1.6 mg/dL — ABNORMAL HIGH (ref 0.3–1.2)
Total Protein: 8.5 g/dL — ABNORMAL HIGH (ref 6.5–8.1)

## 2020-07-03 LAB — PREGNANCY, URINE: Preg Test, Ur: NEGATIVE

## 2020-07-03 LAB — LIPASE, BLOOD: Lipase: 39 U/L (ref 11–51)

## 2020-07-03 MED ORDER — ONDANSETRON 4 MG PO TBDP
4.0000 mg | ORAL_TABLET | Freq: Once | ORAL | Status: AC
  Administered 2020-07-03: 4 mg via ORAL

## 2020-07-03 MED ORDER — ONDANSETRON HCL 4 MG/2ML IJ SOLN
4.0000 mg | Freq: Once | INTRAMUSCULAR | Status: AC
  Administered 2020-07-03: 4 mg via INTRAVENOUS
  Filled 2020-07-03: qty 2

## 2020-07-03 MED ORDER — ONDANSETRON 4 MG PO TBDP: 4.0000 mg | ORAL_TABLET | Freq: Three times a day (TID) | ORAL | 0 refills | Status: DC | PRN

## 2020-07-03 MED ORDER — MORPHINE SULFATE (PF) 4 MG/ML IV SOLN
4.0000 mg | Freq: Once | INTRAVENOUS | Status: AC
  Administered 2020-07-03: 4 mg via INTRAVENOUS
  Filled 2020-07-03: qty 1

## 2020-07-03 MED ORDER — SODIUM CHLORIDE 0.9 % IV BOLUS
1000.0000 mL | Freq: Once | INTRAVENOUS | Status: AC
  Administered 2020-07-03: 1000 mL via INTRAVENOUS

## 2020-07-03 MED ORDER — IOHEXOL 300 MG/ML  SOLN
100.0000 mL | Freq: Once | INTRAMUSCULAR | Status: AC | PRN
  Administered 2020-07-03: 100 mL via INTRAVENOUS

## 2020-07-03 NOTE — ED Notes (Signed)
No answer times 2  

## 2020-07-03 NOTE — ED Notes (Signed)
Passed PO challenge. PA notified.

## 2020-07-03 NOTE — ED Triage Notes (Addendum)
N/V since Tuesday. States she ate some bad chicken. Sent from UC due to "black vomit". She had zofran at [facility].

## 2020-07-03 NOTE — Discharge Instructions (Addendum)
As discussed, all of your labs are reassuring today.  Your CT scan was negative for any acute abnormalities.  Your glucose was slightly elevated.  Please take your Metformin when you get home tonight to help with your high glucose.  I am sending you home with nausea medication.  Take as needed. Follow-up with PCP if symptoms do not improve within the next week. Return to the ER for new or worsening symptoms.

## 2020-07-03 NOTE — ED Notes (Signed)
Pt returned from car to be triaged.

## 2020-07-03 NOTE — ED Notes (Signed)
Unable to reassess vitals, pt in her car

## 2020-07-03 NOTE — ED Triage Notes (Signed)
Pt is here with NVD and abdominal pain that started Tuesday, pt has taken emetrol to relieve discomfort.

## 2020-07-03 NOTE — ED Provider Notes (Signed)
[facility]Y DEPARTMENT Provider Note   CSN: [id number] Arrival date & time: 07/03/20  1240     History Chief Complaint  Patient presents with  . Emesis    [name] is a 45 y.o. female with a past medical history significant for bipolar 1 disorder, diabetes, hypertension, and IBS who presents to the ED due to severe epigastric pain associate with nausea and vomiting x5 days.  Patient was evaluated at urgent care prior to arrival and sent to the ED for further evaluation given severe tenderness.  Patient states abdominal pain started after eating "bad chicken" on Tuesday. Patient states her emesis is now black in color with pink. Denies bilious emesis. She has been drinking pink Pedialyte. She notes she hasn't been able to tolerate any po over the past few days. She hasn't taken her diabetic medication in a few days. Denies sick contacts and known COVID exposures. Denies fever and chills. Admits to a few episodes of non-bloody diarrhea. Denies chest pain and shortness of breath. No previous abdominal operations. Denies urinary and vaginal symptoms.  History obtained from patient and past medical records. No interpreter used during encounter.      Past Medical History:  Diagnosis Date  . Abnormal Pap smear   . Bipolar 1 disorder ([place])   . Diabetes mellitus without complication ([place])   . Fibroids   . Hypertension   . IBS (irritable bowel syndrome)     Patient Active Problem List   Diagnosis Date Noted  . Essential hypertension 04/08/2019  . Class 2 severe obesity due to excess calories with serious comorbidity and body mass index (BMI) of 39.0 to 39.9 in adult ([place]) 04/08/2019  . Tobacco dependence 04/08/2019  . Schizoaffective disorder, bipolar type ([place]) 05/18/2018  . Diabetes mellitus ([place]) 08/10/2016  . Bipolar disorder, curr episode mixed, severe, with psychotic features ([place]) 08/07/2016  . Cannabis use disorder, moderate, dependence ([place]) 08/07/2016  .  CHOLECYSTITIS, UNSPECIFIED 06/12/2008  . BILIARY COLIC 16/38/4665  . CERUMEN IMPACTION, BILATERAL 01/17/2008  . PHARYNGITIS, VIRAL 01/17/2008  . NECK PAIN 01/17/2008    Past Surgical History:  Procedure Laterality Date  . CERVICAL BIOPSY    . CHOLECYSTECTOMY    . TUBAL LIGATION       OB History    Gravida  7   Para  2   Term  2   Preterm      AB  5   Living  2     SAB      IAB  4   Ectopic  1   Multiple      Live Births              Family History  Problem Relation Age of Onset  . Diabetes Father   . Diabetes Paternal Grandmother   . Diabetes Maternal Grandmother   . Mental illness Cousin   . Healthy Mother     Social History   Tobacco Use  . Smoking status: Current Every Day Smoker    Packs/day: 1.00    Years: 3.00    Pack years: 3.00    Types: Cigarettes, E-cigarettes  . Smokeless tobacco: Never Used  Vaping Use  . Vaping Use: Never used  Substance Use Topics  . Alcohol use: Yes    Comment: occasional  . Drug use: Yes    Types: Marijuana    Home Medications Prior to Admission medications   Medication Sig Start Date End Date Taking? Authorizing Provider  ondansetron (  ZOFRAN ODT) 4 MG disintegrating tablet Take 1 tablet (4 mg total) by mouth every 8 (eight) hours as needed for nausea or vomiting. 07/03/20  Yes Kali Deadwyler, Chrys Racer C, PA-C  Accu-Chek Softclix Lancets lancets Use to check FSBS BID. Dx: E11.42 03/11/20   [name] Bang, DO  ARIPiprazole (ABILIFY) 10 MG tablet Take 1 tablet (10 mg total) by mouth daily. 06/15/20   [name] Bang, DO  aspirin (EC-81 ASPIRIN) 81 MG EC tablet Take 1 tablet (81 mg total) by mouth daily. Swallow whole. 02/26/20   [name] Bang, DO  atorvastatin (LIPITOR) 80 MG tablet Take 1 tablet (80 mg total) by mouth daily. 03/11/20   [name] Bang, DO  Blood Glucose Monitoring Suppl (ACCU-CHEK GUIDE ME) w/Device KIT Use to check FSBS BID. Dx: E11.42 03/11/20   [name] Bang, DO  dapagliflozin propanediol (FARXIGA) 5 MG TABS tablet Take 1 tablet (5 mg total) by mouth daily before breakfast. 06/23/20   Camillia Herter, NP  divalproex (DEPAKOTE) 250 MG DR tablet Take 3 tablets by mouth in the morning and at bedtime. 04/22/20   [provider]  gabapentin (NEURONTIN) 100 MG capsule Take 1 capsule (100 mg total) by mouth 3 (three) times daily. 03/11/20   [name] Bang, DO  glucose blood (ACCU-CHEK GUIDE) test strip Use to check FSBS BID. Dx: E11.42 03/11/20   [name] Bang, DO  Insulin Pen Needle (TRUEPLUS PEN NEEDLES) 32G X 4 MM MISC Use as instructed to inject Victoza daily. 03/11/20   [name] Bang, DO  liraglutide (VICTOZA) 18 MG/3ML SOPN Inject 1.8 mg into the skin daily. 06/15/20   [name] Bang, DO  lisinopril (ZESTRIL) 5 MG tablet Take 1 tablet (5 mg total) by mouth daily. 03/11/20   [name] Bang, DO  metFORMIN (GLUCOPHAGE) 1000 MG tablet Take 1 tablet (1,000 mg total) by mouth 2 (two) times daily with a meal. 06/23/20   Camillia Herter, NP    Allergies    Patient has no known allergies.  Review of Systems   Review of Systems  Constitutional: Negative for chills and fever.  Respiratory: Negative for shortness of breath.   Cardiovascular: Negative for chest pain.  Gastrointestinal: Positive for abdominal pain, nausea and vomiting. Negative for diarrhea.  Genitourinary: Negative for dysuria and vaginal discharge.    Physical Exam Updated Vital Signs BP 140/88 (BP Location: Right Arm)   Pulse 80   Temp 98.5 F (36.9 C) (Oral)   Resp 16   Ht 6' (1.829 m)   Wt 108.9 kg   LMP 07/03/2020   SpO2 99%   BMI 32.55 kg/m   Physical Exam Vitals and nursing note reviewed.  Constitutional:      General: She is not in acute distress.    Appearance: She is not toxic-appearing.     Comments: Tearful on exam.   HENT:     Head: Normocephalic.  Eyes:     Pupils: Pupils are equal,  round, and reactive to light.  Cardiovascular:     Rate and Rhythm: Normal rate and regular rhythm.     Pulses: Normal pulses.     Heart sounds: Normal heart sounds. No murmur heard. No friction rub. No gallop.   Pulmonary:     Effort: Pulmonary effort is normal.     Breath sounds: Normal breath sounds.  Abdominal:     General: Abdomen is flat. Bowel sounds are normal. There is no distension.     Palpations: Abdomen  is soft.     Tenderness: There is abdominal tenderness. There is guarding. There is no rebound.     Comments: Epigastric tenderness with voluntary guarding. No rebound.  Musculoskeletal:     Cervical back: Neck supple.     Comments: Able to move all 4 extremities without difficulty.  Skin:    General: Skin is warm and dry.  Neurological:     General: No focal deficit present.     Mental Status: She is alert.  Psychiatric:        Mood and Affect: Mood normal.        Behavior: Behavior normal.     ED Results / Procedures / Treatments   Labs (all labs ordered are listed, but only abnormal results are displayed) Labs Reviewed  COMPREHENSIVE METABOLIC PANEL - Abnormal; Notable for the following components:      Result Value   Sodium 128 (*)    Chloride 95 (*)    CO2 19 (*)    Glucose, Bld 355 (*)    Total Protein 8.5 (*)    AST 14 (*)    Total Bilirubin 1.6 (*)    All other components within normal limits  CBC - Abnormal; Notable for the following components:   WBC 11.9 (*)    RBC 5.34 (*)    MCV 74.7 (*)    MCH 24.2 (*)    RDW 16.3 (*)    Platelets 605 (*)    All other components within normal limits  URINALYSIS, ROUTINE W REFLEX MICROSCOPIC - Abnormal; Notable for the following components:   APPearance HAZY (*)    Glucose, UA >=500 (*)    Hgb urine dipstick LARGE (*)    Ketones, ur >=80 (*)    Protein, ur 100 (*)    All other components within normal limits  URINALYSIS, MICROSCOPIC (REFLEX) - Abnormal; Notable for the following components:    Bacteria, UA MANY (*)    All other components within normal limits  LIPASE, BLOOD  PREGNANCY, URINE    EKG None  Radiology CT ABDOMEN PELVIS W CONTRAST  Result Date: 07/03/2020 CLINICAL DATA:  Epigastric pain, nausea, and vomiting since Tuesday. EXAM: CT ABDOMEN AND PELVIS WITH CONTRAST TECHNIQUE: Multidetector CT imaging of the abdomen and pelvis was performed using the standard protocol following bolus administration of intravenous contrast. CONTRAST:  143m OMNIPAQUE IOHEXOL 300 MG/ML  SOLN COMPARISON:  02/27/2019 FINDINGS: Lower chest: Lung bases are clear. Hepatobiliary: No focal liver abnormality is seen. Status post cholecystectomy. No biliary dilatation. Pancreas: Unremarkable. No pancreatic ductal dilatation or surrounding inflammatory changes. Spleen: Normal in size without focal abnormality. Adrenals/Urinary Tract: Adrenal glands are unremarkable. Kidneys are normal, without renal calculi, focal lesion, or hydronephrosis. Bladder is unremarkable. Stomach/Bowel: Stomach, small bowel, and colon are not abnormally distended. Scattered stool throughout the colon. Scattered colonic diverticula. No wall thickening or inflammatory changes. Vascular/Lymphatic: No significant vascular findings are present. No enlarged abdominal or pelvic lymph nodes. Reproductive: Uterus and bilateral adnexa are unremarkable. Other: No free air or free fluid in the abdomen. Periumbilical hernia containing fat. Musculoskeletal: No acute or significant osseous findings. IMPRESSION: No acute process demonstrated in the abdomen or pelvis. No evidence of bowel obstruction or inflammation. Electronically Signed   By: WLucienne CapersM.D.   On: 07/03/2020 21:59    Procedures Procedures (including critical care time)  Medications Ordered in ED Medications  sodium chloride 0.9 % bolus 1,000 mL (0 mLs Intravenous Stopped 07/03/20 2335)  ondansetron (ZOFRAN) injection 4  mg (4 mg Intravenous Given 07/03/20 2120)  morphine 4  MG/ML injection 4 mg (4 mg Intravenous Given 07/03/20 2120)  iohexol (OMNIPAQUE) 300 MG/ML solution 100 mL (100 mLs Intravenous Contrast Given 07/03/20 2142)    ED Course  I have reviewed the triage vital signs and the nursing notes.  Pertinent labs & imaging results that were available during my care of the patient were reviewed by me and considered in my medical decision making (see chart for details).  Clinical Course as of 07/03/20 2345  Sat Jul 03, 2020  2040 WBC(!): 11.9 [CA]  2041 Hgb urine dipstick(!): LARGE [CA]  2041 Protein(!): 100 [CA]  2041 Ketones, ur(!): >=80 [CA]  2041 Glucose, UA(!): >=500 [CA]  2041 Sodium(!): 128 [CA]  2041 Glucose(!): 355 [CA]  2041 AST(!): 14 [CA]  2041 Preg Test, Ur: NEGATIVE [CA]  2041 Bacteria, UA(!): MANY [CA]    Clinical Course User Index [CA] Suzy Bouchard, PA-C   MDM Rules/Calculators/A&P                         45 year old female presents to the ED from urgent care due to epigastric pain associate with nausea, vomiting, and diarrhea x5 days.  Patient states her emesis is now black in color.  Upon arrival, patient afebrile.  Triage noted patient be tachycardic 106 however during my initial evaluation patient's heart rate in the 90s.  Low suspicion for sepsis at this time.  Patient in no acute distress and nontoxic-appearing however is extremely tearful on exam.  Abdomen soft, nondistended with tenderness in the epigastric region with voluntary guarding.  Routine labs ordered at triage.  Will obtain CT abdomen to rule out emergent intraabdominal etiologies given patient's severe tenderness on exam.  IV fluids, Zofran, and morphine given for symptomatic relief. COVID test pending from UC.  CBC significant for leukocytosis at 11.9 and elevated platelets at 605. CMP significant for hyponatremia at 128, hyperglycemia at 355 with no anion gap.  Doubt DKA.  Suspect hyperglycemia related to medication noncompliance.  Lipase normal at 39.  Low  suspicion for pancreatitis.  UA significant for glucosuria, large hematuria, proteinuria, and ketonuria likely due to dehydration. CT abdomen personally reviewed which is negative for any acute abnormalities.  10:48 PM reassessed patient at bedside who notes improvement in symptoms. Abdomen soft, non-distended with mild tenderness (improvement from previous abdominal exam).   11:40 PM reassessed patient at bedside who notes improvement in symptoms.  Patient able to tolerate p.o. while here in the ED.  Will discharge patient with Zofran as needed for nausea.  Suspect symptoms related to possible gastroenteritis. Strict ED precautions discussed with patient. Patient states understanding and agrees to plan. Patient discharged home in no acute distress and stable vitals.  Final Clinical Impression(s) / ED Diagnoses Final diagnoses:  Epigastric pain  Nausea vomiting and diarrhea    Rx / DC Orders ED Discharge Orders         Ordered    ondansetron (ZOFRAN ODT) 4 MG disintegrating tablet  Every 8 hours PRN        07/03/20 2344           Suzy Bouchard, PA-C 07/03/20 2349    Davonna Belling, MD 07/04/20 1458

## 2020-07-03 NOTE — ED Provider Notes (Signed)
[facility]    CSN: [id number] Arrival date & time: 07/03/20  1021      History   Chief Complaint Chief Complaint  Patient presents with  . Abdominal Pain  . Diarrhea  . Emesis    HPI [name] is a 45 y.o. female  With extensive history as below presenting for nausea, vomiting, diarrhea and abdominal pain that began Tuesday.  States emesis is black, nonprojectile.  She has taken ibuprofen without relief.  Denies change in urination, chest pain, ShOB.  Past Medical History:  Diagnosis Date  . Abnormal Pap smear   . Bipolar 1 disorder ([place])   . Diabetes mellitus without complication ([place])   . Fibroids   . Hypertension   . IBS (irritable bowel syndrome)     Patient Active Problem List   Diagnosis Date Noted  . Essential hypertension 04/08/2019  . Class 2 severe obesity due to excess calories with serious comorbidity and body mass index (BMI) of 39.0 to 39.9 in adult ([place]) 04/08/2019  . Tobacco dependence 04/08/2019  . Schizoaffective disorder, bipolar type ([place]) 05/18/2018  . Diabetes mellitus ([place]) 08/10/2016  . Bipolar disorder, curr episode mixed, severe, with psychotic features ([place]) 08/07/2016  . Cannabis use disorder, moderate, dependence ([place]) 08/07/2016  . CHOLECYSTITIS, UNSPECIFIED 06/12/2008  . BILIARY COLIC 36/64/4034  . CERUMEN IMPACTION, BILATERAL 01/17/2008  . PHARYNGITIS, VIRAL 01/17/2008  . NECK PAIN 01/17/2008    Past Surgical History:  Procedure Laterality Date  . CERVICAL BIOPSY    . CHOLECYSTECTOMY    . TUBAL LIGATION      OB History    Gravida  7   Para  2   Term  2   Preterm      AB  5   Living  2     SAB      IAB  4   Ectopic  1   Multiple      Live Births               Home Medications    Prior to Admission medications   Medication Sig Start Date End Date Taking? Authorizing Provider  Accu-Chek Softclix Lancets lancets Use to check FSBS BID. Dx: E11.42 03/11/20   [name] Bang, DO   ARIPiprazole (ABILIFY) 10 MG tablet Take 1 tablet (10 mg total) by mouth daily. 06/15/20   [name] Bang, DO  aspirin (EC-81 ASPIRIN) 81 MG EC tablet Take 1 tablet (81 mg total) by mouth daily. Swallow whole. 02/26/20   [name] Bang, DO  atorvastatin (LIPITOR) 80 MG tablet Take 1 tablet (80 mg total) by mouth daily. 03/11/20   [name] Bang, DO  Blood Glucose Monitoring Suppl (ACCU-CHEK GUIDE ME) w/Device KIT Use to check FSBS BID. Dx: E11.42 03/11/20   [name] Bang, DO  dapagliflozin propanediol (FARXIGA) 5 MG TABS tablet Take 1 tablet (5 mg total) by mouth daily before breakfast. 06/23/20   Camillia Herter, NP  divalproex (DEPAKOTE) 250 MG DR tablet Take 3 tablets by mouth in the morning and at bedtime. 04/22/20   [provider]  gabapentin (NEURONTIN) 100 MG capsule Take 1 capsule (100 mg total) by mouth 3 (three) times daily. 03/11/20   [name] Bang, DO  glucose blood (ACCU-CHEK GUIDE) test strip Use to check FSBS BID. Dx: E11.42 03/11/20   [name] Bang, DO  Insulin Pen Needle (TRUEPLUS PEN NEEDLES) 32G X 4 MM MISC Use as instructed to inject Victoza daily. 03/11/20  [name] Bang, DO  liraglutide (VICTOZA) 18 MG/3ML SOPN Inject 1.8 mg into the skin daily. 06/15/20   [name] Bang, DO  lisinopril (ZESTRIL) 5 MG tablet Take 1 tablet (5 mg total) by mouth daily. 03/11/20   [name] Bang, DO  metFORMIN (GLUCOPHAGE) 1000 MG tablet Take 1 tablet (1,000 mg total) by mouth 2 (two) times daily with a meal. 06/23/20   Camillia Herter, NP    Family History Family History  Problem Relation Age of Onset  . Diabetes Father   . Diabetes Paternal Grandmother   . Diabetes Maternal Grandmother   . Mental illness Cousin   . Healthy Mother     Social History Social History   Tobacco Use  . Smoking status: Current Every Day Smoker    Packs/day: 1.00    Years: 3.00    Pack years: 3.00     Types: Cigarettes, E-cigarettes  . Smokeless tobacco: Never Used  Vaping Use  . Vaping Use: Never used  Substance Use Topics  . Alcohol use: Yes    Comment: occasional  . Drug use: Yes    Types: Marijuana     Allergies   Patient has no known allergies.   Review of Systems Review of Systems  Constitutional: Negative for fatigue and fever.  HENT: Negative for congestion, dental problem, ear pain, facial swelling, hearing loss, sinus pain, sore throat, trouble swallowing and voice change.   Eyes: Negative for photophobia, pain and visual disturbance.  Respiratory: Negative for cough and shortness of breath.   Cardiovascular: Negative for chest pain and palpitations.  Gastrointestinal: Positive for abdominal pain, diarrhea, nausea and vomiting. Negative for blood in stool.  Musculoskeletal: Negative for arthralgias and myalgias.  Neurological: Negative for dizziness and headaches.     Physical Exam Triage Vital Signs ED Triage Vitals  Enc Vitals Group     BP 07/03/20 1112 (!) 153/91     Pulse Rate 07/03/20 1112 (!) 107     Resp 07/03/20 1112 (!) 22     Temp 07/03/20 1112 98 F (36.7 C)     Temp Source 07/03/20 1112 Oral     SpO2 07/03/20 1112 98 %     Weight --      Height --      Head Circumference --      Peak Flow --      Pain Score 07/03/20 1111 0     Pain Loc --      Pain Edu? --      Excl. in [place]? --    No data found.  Updated Vital Signs BP (!) 153/91 (BP Location: Left Arm)   Pulse (!) 107   Temp 98 F (36.7 C) (Oral)   Resp (!) 22   LMP 07/03/2020   SpO2 98%   Visual Acuity Right Eye Distance:   Left Eye Distance:   Bilateral Distance:    Right Eye Near:   Left Eye Near:    Bilateral Near:     Physical Exam Constitutional:      General: She is not in acute distress.    Appearance: She is not ill-appearing or diaphoretic.  HENT:     Head: Normocephalic and atraumatic.     Right Ear: Tympanic membrane and ear canal normal.     Left Ear:  Tympanic membrane and ear canal normal.     Mouth/Throat:     Mouth: Mucous membranes are moist.     Pharynx: Oropharynx is clear. No  oropharyngeal exudate or posterior oropharyngeal erythema.  Eyes:     General: No scleral icterus.    Conjunctiva/sclera: Conjunctivae normal.     Pupils: Pupils are equal, round, and reactive to light.  Neck:     Comments: Trachea midline, negative JVD Cardiovascular:     Rate and Rhythm: Normal rate and regular rhythm.     Heart sounds: No murmur heard. No gallop.   Pulmonary:     Effort: Pulmonary effort is normal. No respiratory distress.     Breath sounds: No wheezing, rhonchi or rales.  Abdominal:     General: Abdomen is flat. Bowel sounds are normal.     Palpations: Abdomen is soft. There is no hepatomegaly, splenomegaly or pulsatile mass.     Tenderness: There is abdominal tenderness in the epigastric area.     Comments: Voluntary guarding present.  Musculoskeletal:     Cervical back: Neck supple. No tenderness.  Lymphadenopathy:     Cervical: No cervical adenopathy.  Skin:    Capillary Refill: Capillary refill takes less than 2 seconds.     Coloration: Skin is not jaundiced or pale.     Findings: No rash.  Neurological:     General: No focal deficit present.     Mental Status: She is alert and oriented to person, place, and time.      UC Treatments / Results  Labs (all labs ordered are listed, but only abnormal results are displayed) Labs Reviewed  NOVEL CORONAVIRUS, NAA    EKG   Radiology No results found.  Procedures Procedures (including critical care time)  Medications Ordered in UC Medications  ondansetron (ZOFRAN-ODT) disintegrating tablet 4 mg (4 mg Oral Given 07/03/20 1203)    Initial Impression / Assessment and Plan / UC Course  I have reviewed the triage vital signs and the nursing notes.  Pertinent labs & imaging results that were available during my care of the patient were reviewed by me and considered in  my medical decision making (see chart for details).     Patient afebrile, nontoxic, with SpO2 98%.  Covid PCR pending.  Given exquisite epigastric pain with reported black emesis, referred to ER for further evaluation and management.  Return precautions discussed, patient verbalized understanding and is agreeable to plan. Final Clinical Impressions(s) / UC Diagnoses   Final diagnoses:  Epigastric pain  Gastroenteritis   Discharge Instructions   None    ED Prescriptions    None     PDMP not reviewed this encounter.   [place], [place], [place] 07/03/20 1357

## 2020-07-03 NOTE — ED Notes (Signed)
Patient transported to CT 

## 2020-07-06 LAB — NOVEL CORONAVIRUS, NAA: SARS-CoV-2, NAA: NOT DETECTED

## 2020-07-28 ENCOUNTER — Telehealth: Payer: Medicaid Other | Admitting: Internal Medicine

## 2020-08-07 ENCOUNTER — Ambulatory Visit (HOSPITAL_COMMUNITY)
Admission: EM | Admit: 2020-08-07 | Discharge: 2020-08-08 | Disposition: A | Discharge status: Other Acute Inpatient Hospital | Payer: Medicaid Other | Attending: Psychiatry | Admitting: Psychiatry

## 2020-08-07 ENCOUNTER — Other Ambulatory Visit: Payer: Self-pay

## 2020-08-07 ENCOUNTER — Encounter (HOSPITAL_COMMUNITY): Payer: Self-pay | Admitting: Emergency Medicine

## 2020-08-07 DIAGNOSIS — R4182 Altered mental status, unspecified: Secondary | ICD-10-CM | POA: Insufficient documentation

## 2020-08-07 DIAGNOSIS — F1290 Cannabis use, unspecified, uncomplicated: Secondary | ICD-10-CM | POA: Insufficient documentation

## 2020-08-07 DIAGNOSIS — F23 Brief psychotic disorder: Secondary | ICD-10-CM | POA: Insufficient documentation

## 2020-08-07 NOTE — ED Triage Notes (Signed)
EMS transport, pt presents with severe anxiety and stress.  Denies SI, or HI.  Pt reports she hears voices. Pt reports not taking her psych meds.

## 2020-08-08 ENCOUNTER — Emergency Department (HOSPITAL_COMMUNITY): Payer: Medicaid Other

## 2020-08-08 ENCOUNTER — Other Ambulatory Visit: Payer: Self-pay

## 2020-08-08 ENCOUNTER — Emergency Department (HOSPITAL_COMMUNITY)
Admission: EM | Admit: 2020-08-08 | Discharge: 2020-08-09 | Disposition: A | Discharge status: Home / Self Care | Payer: Medicaid Other | Attending: Emergency Medicine | Admitting: Emergency Medicine

## 2020-08-08 DIAGNOSIS — E119 Type 2 diabetes mellitus without complications: Secondary | ICD-10-CM | POA: Insufficient documentation

## 2020-08-08 DIAGNOSIS — F259 Schizoaffective disorder, unspecified: Secondary | ICD-10-CM | POA: Insufficient documentation

## 2020-08-08 DIAGNOSIS — F17210 Nicotine dependence, cigarettes, uncomplicated: Secondary | ICD-10-CM | POA: Diagnosis not present

## 2020-08-08 DIAGNOSIS — F29 Unspecified psychosis not due to a substance or known physiological condition: Secondary | ICD-10-CM | POA: Insufficient documentation

## 2020-08-08 DIAGNOSIS — I10 Essential (primary) hypertension: Secondary | ICD-10-CM | POA: Insufficient documentation

## 2020-08-08 DIAGNOSIS — Z79899 Other long term (current) drug therapy: Secondary | ICD-10-CM | POA: Diagnosis not present

## 2020-08-08 DIAGNOSIS — Z7982 Long term (current) use of aspirin: Secondary | ICD-10-CM | POA: Insufficient documentation

## 2020-08-08 DIAGNOSIS — Z20822 Contact with and (suspected) exposure to covid-19: Secondary | ICD-10-CM | POA: Diagnosis not present

## 2020-08-08 DIAGNOSIS — F419 Anxiety disorder, unspecified: Secondary | ICD-10-CM | POA: Diagnosis present

## 2020-08-08 DIAGNOSIS — R000 Tachycardia, unspecified: Secondary | ICD-10-CM | POA: Insufficient documentation

## 2020-08-08 DIAGNOSIS — Z7984 Long term (current) use of oral hypoglycemic drugs: Secondary | ICD-10-CM | POA: Diagnosis not present

## 2020-08-08 LAB — COMPREHENSIVE METABOLIC PANEL
ALT: 17 U/L (ref 0–44)
AST: 16 U/L (ref 15–41)
Albumin: 3.7 g/dL (ref 3.5–5.0)
Alkaline Phosphatase: 80 U/L (ref 38–126)
Anion gap: 15 (ref 5–15)
BUN: 8 mg/dL (ref 6–20)
CO2: 20 mmol/L — ABNORMAL LOW (ref 22–32)
Calcium: 9.8 mg/dL (ref 8.9–10.3)
Chloride: 103 mmol/L (ref 98–111)
Creatinine, Ser: 0.73 mg/dL (ref 0.44–1.00)
GFR, Estimated: >60 mL/min (ref >60)
Glucose, Bld: 269 mg/dL — ABNORMAL HIGH (ref 70–99)
Potassium: 3.6 mmol/L (ref 3.5–5.1)
Sodium: 138 mmol/L (ref 135–145)
Total Bilirubin: 1.1 mg/dL (ref 0.3–1.2)
Total Protein: 7.8 g/dL (ref 6.5–8.1)

## 2020-08-08 LAB — GLUCOSE, CAPILLARY: Glucose-Capillary: 267 mg/dL — ABNORMAL HIGH (ref 70–99)

## 2020-08-08 LAB — I-STAT BETA HCG BLOOD, ED (MC, WL, AP ONLY): I-stat hCG, quantitative: <5 m[IU]/mL (ref <5)

## 2020-08-08 LAB — POC SARS CORONAVIRUS 2 AG -  ED: SARS Coronavirus 2 Ag: NEGATIVE

## 2020-08-08 LAB — URINALYSIS, ROUTINE W REFLEX MICROSCOPIC
Bacteria, UA: NONE SEEN
Bilirubin Urine: NEGATIVE
Glucose, UA: >=500 mg/dL — AB
Ketones, ur: 20 mg/dL — AB
Leukocytes,Ua: NEGATIVE
Nitrite: NEGATIVE
Protein, ur: 100 mg/dL — AB
Specific Gravity, Urine: 1.026 (ref 1.005–1.030)
pH: 5 (ref 5.0–8.0)

## 2020-08-08 LAB — CBC
HCT: 36 % (ref 36.0–46.0)
Hemoglobin: 11.3 g/dL — ABNORMAL LOW (ref 12.0–15.0)
MCH: 23.8 pg — ABNORMAL LOW (ref 26.0–34.0)
MCHC: 31.4 g/dL (ref 30.0–36.0)
MCV: 75.9 fL — ABNORMAL LOW (ref 80.0–100.0)
Platelets: 512 10*3/uL — ABNORMAL HIGH (ref 150–400)
RBC: 4.74 MIL/uL (ref 3.87–5.11)
RDW: 17.2 % — ABNORMAL HIGH (ref 11.5–15.5)
WBC: 12.1 10*3/uL — ABNORMAL HIGH (ref 4.0–10.5)
nRBC: 0 % (ref 0.0–0.2)

## 2020-08-08 LAB — RAPID URINE DRUG SCREEN, HOSP PERFORMED
Amphetamines: NOT DETECTED
Barbiturates: NOT DETECTED
Benzodiazepines: NOT DETECTED
Cocaine: NOT DETECTED
Opiates: NOT DETECTED
Tetrahydrocannabinol: POSITIVE — AB

## 2020-08-08 LAB — SARS CORONAVIRUS 2 (TAT 6-24 HRS): SARS Coronavirus 2: NEGATIVE

## 2020-08-08 LAB — ETHANOL: Alcohol, Ethyl (B): <10 mg/dL (ref <10)

## 2020-08-08 MED ORDER — DAPAGLIFLOZIN PROPANEDIOL 5 MG PO TABS
5.0000 mg | ORAL_TABLET | Freq: Every day | ORAL | Status: DC
  Administered 2020-08-08 – 2020-08-09 (×2): 5 mg via ORAL
  Filled 2020-08-08 (×3): qty 1

## 2020-08-08 MED ORDER — METFORMIN HCL 500 MG PO TABS
1000.0000 mg | ORAL_TABLET | Freq: Once | ORAL | Status: AC
  Administered 2020-08-08: 1000 mg via ORAL
  Filled 2020-08-08: qty 2

## 2020-08-08 MED ORDER — ATORVASTATIN CALCIUM 40 MG PO TABS
80.0000 mg | ORAL_TABLET | Freq: Every day | ORAL | Status: DC
  Administered 2020-08-08 – 2020-08-09 (×2): 80 mg via ORAL
  Filled 2020-08-08 (×2): qty 2

## 2020-08-08 MED ORDER — METFORMIN HCL 500 MG PO TABS
1000.0000 mg | ORAL_TABLET | Freq: Two times a day (BID) | ORAL | Status: DC
  Administered 2020-08-08 – 2020-08-09 (×3): 1000 mg via ORAL
  Filled 2020-08-08 (×3): qty 2

## 2020-08-08 MED ORDER — LORAZEPAM 2 MG/ML IJ SOLN: 2.0000 mg | Freq: Once | INTRAMUSCULAR | Status: AC

## 2020-08-08 MED ORDER — LORAZEPAM 1 MG PO TABS
1.0000 mg | ORAL_TABLET | ORAL | Status: DC
  Administered 2020-08-08 – 2020-08-09 (×6): 1 mg via ORAL
  Filled 2020-08-08 (×6): qty 1

## 2020-08-08 MED ORDER — ARIPIPRAZOLE 10 MG PO TABS
10.0000 mg | ORAL_TABLET | Freq: Every day | ORAL | Status: DC
  Administered 2020-08-08 – 2020-08-09 (×2): 10 mg via ORAL
  Filled 2020-08-08 (×3): qty 1

## 2020-08-08 MED ORDER — LISINOPRIL 10 MG PO TABS
5.0000 mg | ORAL_TABLET | Freq: Once | ORAL | Status: AC
  Administered 2020-08-08: 5 mg via ORAL
  Filled 2020-08-08: qty 1

## 2020-08-08 MED ORDER — DIVALPROEX SODIUM 250 MG PO DR TAB
500.0000 mg | DELAYED_RELEASE_TABLET | Freq: Two times a day (BID) | ORAL | Status: DC
  Administered 2020-08-08 – 2020-08-09 (×3): 500 mg via ORAL
  Filled 2020-08-08 (×3): qty 2

## 2020-08-08 MED ORDER — GABAPENTIN 100 MG PO CAPS
100.0000 mg | ORAL_CAPSULE | Freq: Three times a day (TID) | ORAL | Status: DC
Start: 2020-08-08 — End: 2020-08-09
  Administered 2020-08-08 – 2020-08-09 (×4): 100 mg via ORAL
  Filled 2020-08-08 (×4): qty 1

## 2020-08-08 MED ORDER — LORAZEPAM 1 MG PO TABS
2.0000 mg | ORAL_TABLET | Freq: Once | ORAL | Status: AC
  Administered 2020-08-08: 2 mg via ORAL
  Filled 2020-08-08: qty 2

## 2020-08-08 MED ORDER — LISINOPRIL 10 MG PO TABS
5.0000 mg | ORAL_TABLET | Freq: Every day | ORAL | Status: DC
  Administered 2020-08-08 – 2020-08-09 (×2): 5 mg via ORAL
  Filled 2020-08-08 (×2): qty 1

## 2020-08-08 MED ORDER — ACETAMINOPHEN 500 MG PO TABS
1000.0000 mg | ORAL_TABLET | Freq: Once | ORAL | Status: AC
  Administered 2020-08-08: 1000 mg via ORAL
  Filled 2020-08-08: qty 2

## 2020-08-08 MED ORDER — LIRAGLUTIDE 18 MG/3ML ~~LOC~~ SOPN: 1.8000 mg | PEN_INJECTOR | Freq: Every day | SUBCUTANEOUS | Status: DC

## 2020-08-08 MED ORDER — LORAZEPAM 2 MG/ML IJ SOLN: 1.0000 mg | INTRAMUSCULAR | Status: DC

## 2020-08-08 NOTE — ED Provider Notes (Signed)
Behavioral Health Urgent Care Medical Screening Exam  Patient Name: [name] MRN: [id number] Date of Evaluation: 08/08/20 Chief Complaint:  Severe anxiety and stress, hallucinations  Diagnosis:  Final diagnoses:  Altered mental status, unspecified altered mental status type  Acute psychosis ([place])    Disposition: Patient exhibiting altered mental status and unable to answer certain questions. During my exam, patient exhibited 2 brief instances of tonic/catatonic-like movements without loss of consciousness. After these instances, patient continued to intermittently respond to questions and stare into space at times similarly to her behavior before these movements.  Due to patient's intermittent labile behaviors both before and after these movements, it is difficult to determine if the patient was experiencing a post ictal state, and I am not entirely sure if this activity was seizure-like or catatonia. Believe that the patient is experiencing psychosis, but due to patient's presentation and altered mental status, believe that the patient needs medical clearance before further psych evaluation is completed at this time. Patient to be transferred to the [facility] emergency department for further medical evaluation/clearance. Provider report given to Dr. Leonette Monarch and Dr. Leonette Monarch has agreed to accept the patient.  Details regarding the patient's altered mental status and tonic/catatonic-like behaviors were mentioned to Dr. Leonette Monarch and he verbalizes understanding of this. Notified Dr. Leonette Monarch that I believe the patient is also experiencing psychosis/psych issues at this time and that the emergency department provider assuming care of the patient should place a TTS consult for the patient to be reevaluated by psychiatry via telepsych while the patient is in the emergency department once the patient is medically cleared. Dr. Arnoldo Morale understanding and agreement of this plan. [place] Nursing Report  given to [facility] emergency department charge nurse. EMTALA form completed.  History of Present illness: [name] is a 45 y.o. female resents to the who presents to the behavioral urgent care voluntarily via EMS for severe anxiety and stress and hallucinations.  Patient states that she is at the behavioral health urgent care because she has been "having separation anxiety today".  Patient states that her family has been "kind of distant".  Patient states that she lives with her boyfriend and that they have been together for a while, but when patient is asked where her boyfriend lives she states that she does not know where they live.  When asked about hallucinations, patient states that she "will cry for an hour if I talk about it.  Do not want to cry anymore).  When patient is asked to further describe her hallucinations, patient endorses auditory hallucinations saying that she "hears voices all day and all night.  The voices are not talking" patient states that "the voices sound like this" and then points to the ceiling.  Patient states that she has "been hearing a lot of stuff" and "hearing gunshots".  Patient says that I "never really see the incidents, I just hear them.  It is getting to be too much because of the loud noises.  There in front of my door and everywhere.  It sounds like crimes, but it may not be."  Patient appears to be paranoid about these noises/voices.  Patient denies visual hallucinations.  When asked about SI, patient repeatedly states "no, no, no!"  And when asked about HI, she denies HI and states "I do not think I would not want to do that".  Patient states that she has not been sleeping enough.  She endorses feelings of guilt and hopelessness and states that her  energy and concentration "varies" patient states that she occasionally uses marijuana and has been using since the age of 52.  Patient also endorses drinking alcohol occasionally but does not describe this further.   Patient is unable to answer questions regarding further substance use.  On exam, is alert and oriented to her name, the month, the year.  However, she is not oriented to location or the day of the week.  Patient will intermittently stare into space and often does not respond to questions.  When patient is asked additional questions, she states that she does not know how to answer any more questions.  When patient is asked about ROS, she states that she is unable to provide any answers to these questions.  At multiple times during the encounter, patient states "cannot call on your name of Jesus per minute please" and appears to be praying to herself.  Patient also states "okay, and he is here" at times. Patient demonstrates intermittent tremor on bilateral hands. During the exam, patient exhibited 2 brief instances of tonic/catatonic-like movements without loss of consciousness. After these instances, patient continued to intermittently respond to questions and stare into space at times similarly to her behavior before these movements.  Due to patient's intermittent labile behaviors both before and after these movements, it is difficult to determine if the patient was experiencing a post ictal state, and I am not entirely sure if this activity was seizure-like or catatonia.  Patient is also quite diaphoretic.  Blood sugar 267, temperature 99.3, blood pressure slightly elevated.  Patient also intermittently makes loud grunting noises and will be very tearful, stop crying and then will laugh hysterically.  Due to my concern for patient needing medical clearance, the assessment was ended at this time and decision was made to transfer the patient to the emergency department for medical evaluation/clearance.   Psychiatric Specialty Exam  Any sections of the PSE that are blank or state "Unable to be assessed" are due to the patient not answering questions related to those sections of the PSE.   Presentation   General Appearance:Bizarre; Disheveled (Patient diaphoretic)  Eye Contact:Poor  Speech:Garbled  Speech Volume:Decreased  Handedness:No data recorded  Mood and Affect  Mood:-- (Unable to assess)  Affect:Tearful; Labile (Patient is very tearful at times and hysterically laughing at other times.)   Thought Process  Thought Processes:Disorganized  Descriptions of Associations:Loose  Orientation:-- (Patient alert and oriented to name, month and year, but not oriented to location or day of the week)  Thought Content:Scattered; Paranoid Ideation  Hallucinations:Auditory  Ideas of Reference:Paranoia  Suicidal Thoughts:No  Homicidal Thoughts:No   Sensorium  Memory:-- (Unable to be assessed)  Judgment:Poor  Insight:Poor   Executive Functions  Concentration:Poor  Attention Span:Poor  [place] of Knowledge:Poor  Language:Poor   Psychomotor Activity  Psychomotor Activity:Restlessness; Tremor (Patient demonstrates intermittent tremor on bilateral hands. Patient exhibited 2 brief instances of tonic/catatonic-like movements without loss of consciousness. After these instances, patient continued to intermittently respond to questions.)   Assets  Assets:Housing; Financial Resources/Insurance; Desire for Improvement; Physical Health; Leisure Time   Sleep  Sleep:Poor  Number of hours: No data recorded  Physical Exam: Physical Exam Vitals reviewed.  Constitutional:      General: She is in acute distress.     Appearance: She is ill-appearing and diaphoretic.  HENT:     Head: Normocephalic and atraumatic.     Right Ear: External ear normal.     Left Ear: External ear normal.  Cardiovascular:  Rate and Rhythm: Tachycardia present.  Pulmonary:     Comments: Respiratory effort increased with slight respiratory distress noted. Musculoskeletal:        General: Normal range of motion.     Cervical back: Normal range of motion.  Neurological:      Mental Status: She is alert.     Comments: Patient alert and oriented to name, month and year, but not oriented to location or day of the week.  Patient also states that she does not know where her and her boyfriend live. Patient demonstrates intermittent tremor on bilateral hands. During the exam, patient exhibited 2 brief instances of tonic/catatonic-like movements without loss of consciousness. After these instances, patient continued to intermittently respond to questions and stare into space at times similarly to her behavior before these movements.  Due to patient's intermittent labile behaviors both before and after these movements, it is difficult to determine if the patient was experiencing a post ictal state, and I am not entirely sure if this activity was seizure-like or catatonia.   Psychiatric:        Attention and Perception: She perceives auditory hallucinations. She does not perceive visual hallucinations.        Behavior: Behavior is not agitated, slowed, aggressive, withdrawn, hyperactive or combative.        Thought Content: Thought content is paranoid. Thought content does not include homicidal or suicidal ideation.     Comments: Mood and affect unable to be assessed.  Speech is garbled and voice is low in tone.  Patient is able to answer questions at times but is unable to answer questions at other times.  Memory unable to be assessed at this time.    ROS  Patient was not able to answer questions when asked about ROS.   Vitals: Blood pressure (!) 149/93, [name] (!) 114, temperature 99.3 F (37.4 C), temperature source Oral, resp. rate 20, SpO2 100 %. There is no height or weight on file to calculate BMI.  Musculoskeletal: Strength & Muscle Tone: within normal limits Gait & Station: unsteady, Patient was put into a wheelchair upon arrival to the behavioral health urgent care due to her unsteady gait. Patient leans: [place] [facility] Discharge Disposition for Follow up and  Recommendations: Based on my evaluation, the patient appears to have an urgent medical condition for which I recommend the patient be transferred to the emergency department for medical evaluation/clearance.  Patient exhibiting altered mental status and unable to answer certain questions. During my exam, patient exhibited 2 brief instances of tonic/catatonic-like movements without loss of consciousness. After these instances, patient continued to intermittently respond to questions and stare into space at times similarly to her behavior before these movements.  Due to patient's intermittent labile behaviors both before and after these movements, it is difficult to determine if the patient was experiencing a post ictal state, and I am not entirely sure if this activity was seizure-like or catatonia. Believe that the patient is experiencing psychosis, but due to patient's presentation and altered mental status, believe that the patient needs medical clearance before further psych evaluation is completed at this time. Patient to be transferred to the [facility] emergency department for further medical evaluation/clearance. Provider report given to Dr. Leonette Monarch and Dr. Leonette Monarch has agreed to accept the patient.  Details regarding the patient's altered mental status and tonic/catatonic-like behaviors were mentioned to Dr. Leonette Monarch and he verbalizes understanding of this. Notified Dr. Leonette Monarch that I believe the patient is also  experiencing psychosis/psych issues at this time and that the emergency department provider assuming care of the patient should place a TTS consult for the patient to be reevaluated by psychiatry via telepsych while the patient is in the emergency department once the patient is medically cleared. Dr. Arnoldo Morale understanding agreement of this plan. [place] Nursing Report given to [facility] emergency department charge nurse. EMTALA form completed.  Prescilla Sours, PA-C 08/08/2020, 12:44  AM

## 2020-08-08 NOTE — BH Assessment (Signed)
TTS attempted to assess patient, however patient is not responding to questions. ED staff stated that patient was talking right before TTS called but will not respond at this time. NP notified.

## 2020-08-08 NOTE — Discharge Instructions (Signed)
Patient to be transferred to [facility] ED for medical clearance.

## 2020-08-08 NOTE — ED Triage Notes (Signed)
Pt presents to ED BIB [facility]. Sent here from [facility] d/t "AMS". Pt AAOx4. Pt's emotions labile and appears to be responding to eternal stimuli. Pt denies SI/HI

## 2020-08-08 NOTE — ED Notes (Signed)
Lunch Tray Ordered @ 1105. 

## 2020-08-08 NOTE — ED Provider Notes (Signed)
[facility] EMERGENCY DEPARTMENT Provider Note  CSN: [id number] Arrival date & time: 08/08/20 0118  Chief Complaint(s) Medical Clearance  HPI [name] is a 45 y.o. female who was transferred over from behavioral health urgent care for medical clearance.  Patient presented there for anxiety, depression and auditory/visual hallucinations.  She was noted to be tachycardic and altered.  On arrival here the patient states that she is "crazy."  She also reports hearing voices and seeing people.  She denies any SI, HI.  When attempting to obtain details, patient would not provide additional information.  She did however deny any physical complaints.  HPI  Past Medical History Past Medical History:  Diagnosis Date  . Abnormal Pap smear   . Bipolar 1 disorder ([place])   . Diabetes mellitus without complication ([place])   . Fibroids   . Hypertension   . IBS (irritable bowel syndrome)    Patient Active Problem List   Diagnosis Date Noted  . Essential hypertension 04/08/2019  . Class 2 severe obesity due to excess calories with serious comorbidity and body mass index (BMI) of 39.0 to 39.9 in adult ([place]) 04/08/2019  . Tobacco dependence 04/08/2019  . Schizoaffective disorder, bipolar type ([place]) 05/18/2018  . Diabetes mellitus ([place]) 08/10/2016  . Bipolar disorder, curr episode mixed, severe, with psychotic features ([place]) 08/07/2016  . Cannabis use disorder, moderate, dependence ([place]) 08/07/2016  . CHOLECYSTITIS, UNSPECIFIED 06/12/2008  . BILIARY COLIC 22/97/9892  . CERUMEN IMPACTION, BILATERAL 01/17/2008  . PHARYNGITIS, VIRAL 01/17/2008  . NECK PAIN 01/17/2008   Home Medication(s) Prior to Admission medications   Medication Sig Start Date End Date Taking? Authorizing Provider  Accu-Chek Softclix Lancets lancets Use to check FSBS BID. Dx: E11.42 03/11/20   [name] Bang, DO  ARIPiprazole (ABILIFY) 10 MG tablet Take 1 tablet (10 mg total) by mouth daily. 06/15/20    [name] Bang, DO  aspirin (EC-81 ASPIRIN) 81 MG EC tablet Take 1 tablet (81 mg total) by mouth daily. Swallow whole. 02/26/20   [name] Bang, DO  atorvastatin (LIPITOR) 80 MG tablet Take 1 tablet (80 mg total) by mouth daily. 03/11/20   [name] Bang, DO  Blood Glucose Monitoring Suppl (ACCU-CHEK GUIDE ME) w/Device KIT Use to check FSBS BID. Dx: E11.42 03/11/20   [name] Bang, DO  dapagliflozin propanediol (FARXIGA) 5 MG TABS tablet Take 1 tablet (5 mg total) by mouth daily before breakfast. 06/23/20   Camillia Herter, NP  divalproex (DEPAKOTE) 250 MG DR tablet Take 3 tablets by mouth in the morning and at bedtime. 04/22/20   [provider]  gabapentin (NEURONTIN) 100 MG capsule Take 1 capsule (100 mg total) by mouth 3 (three) times daily. 03/11/20   [name] Bang, DO  glucose blood (ACCU-CHEK GUIDE) test strip Use to check FSBS BID. Dx: E11.42 03/11/20   [name] Bang, DO  Insulin Pen Needle (TRUEPLUS PEN NEEDLES) 32G X 4 MM MISC Use as instructed to inject Victoza daily. 03/11/20   [name] Bang, DO  liraglutide (VICTOZA) 18 MG/3ML SOPN Inject 1.8 mg into the skin daily. 06/15/20   [name] Bang, DO  lisinopril (ZESTRIL) 5 MG tablet Take 1 tablet (5 mg total) by mouth daily. 03/11/20   [name] Bang, DO  metFORMIN (GLUCOPHAGE) 1000 MG tablet Take 1 tablet (1,000 mg total) by mouth 2 (two) times daily with a meal. 06/23/20   Minette Brine, Amy J, NP  ondansetron (ZOFRAN ODT) 4 MG disintegrating tablet Take 1  tablet (4 mg total) by mouth every 8 (eight) hours as needed for nausea or vomiting. 07/03/20   Suzy Bouchard, PA-C                                                                                                                                    Past Surgical History Past Surgical History:  Procedure Laterality Date  . CERVICAL BIOPSY    . CHOLECYSTECTOMY    . TUBAL LIGATION      Family History Family History  Problem Relation Age of Onset  . Diabetes Father   . Diabetes Paternal Grandmother   . Diabetes Maternal Grandmother   . Mental illness Cousin   . Healthy Mother     Social History Social History   Tobacco Use  . Smoking status: Current Every Day Smoker    Packs/day: 1.00    Years: 3.00    Pack years: 3.00    Types: Cigarettes, E-cigarettes  . Smokeless tobacco: Never Used  Vaping Use  . Vaping Use: Never used  Substance Use Topics  . Alcohol use: Yes    Comment: occasional  . Drug use: Yes    Types: Marijuana   Allergies Patient has no known allergies.  Review of Systems Review of Systems All other systems are reviewed and are negative for acute change except as noted in the HPI  Physical Exam Vital Signs  I have reviewed the triage vital signs BP (!) 170/83   Pulse 92   Temp 98.6 F (37 C) (Oral)   Resp 18   SpO2 98%   Physical Exam Vitals reviewed.  Constitutional:      General: She is not in acute distress.    Appearance: She is well-developed and well-nourished. She is not diaphoretic.  HENT:     Head: Normocephalic and atraumatic.     Nose: Nose normal.  Eyes:     General: No scleral icterus.       Right eye: No discharge.        Left eye: No discharge.     Extraocular Movements: EOM normal.     Conjunctiva/sclera: Conjunctivae normal.     Pupils: Pupils are equal, round, and reactive to light.  Cardiovascular:     Rate and Rhythm: Normal rate and regular rhythm.     Heart sounds: No murmur heard. No friction rub. No gallop.   Pulmonary:     Effort: Pulmonary effort is normal. No respiratory distress.     Breath sounds: Normal breath sounds. No stridor. No rales.  Abdominal:     General: There is no distension.     Palpations: Abdomen is soft.     Tenderness: There is no abdominal tenderness.  Musculoskeletal:        General: No tenderness or edema.     Cervical back: Normal range of motion and neck  supple.  Skin:    General: Skin is  warm and dry.     Findings: No erythema or rash.  Neurological:     Mental Status: She is alert and oriented to person, place, and time.  Psychiatric:        Mood and Affect: Mood is anxious.        Speech: Speech is tangential.        Behavior: Behavior is cooperative.     Comments: Soft spoken     ED Results and Treatments Labs (all labs ordered are listed, but only abnormal results are displayed) Labs Reviewed  COMPREHENSIVE METABOLIC PANEL - Abnormal; Notable for the following components:      Result Value   CO2 20 (*)    Glucose, Bld 269 (*)    All other components within normal limits  CBC - Abnormal; Notable for the following components:   WBC 12.1 (*)    Hemoglobin 11.3 (*)    MCV 75.9 (*)    MCH 23.8 (*)    RDW 17.2 (*)    Platelets 512 (*)    All other components within normal limits  RAPID URINE DRUG SCREEN, HOSP PERFORMED - Abnormal; Notable for the following components:   Tetrahydrocannabinol POSITIVE (*)    All other components within normal limits  URINALYSIS, ROUTINE W REFLEX MICROSCOPIC - Abnormal; Notable for the following components:   APPearance CLOUDY (*)    Glucose, UA >=500 (*)    Hgb urine dipstick LARGE (*)    Ketones, ur 20 (*)    Protein, ur 100 (*)    All other components within normal limits  SARS CORONAVIRUS 2 (TAT 6-24 HRS)  ETHANOL  I-STAT BETA HCG BLOOD, ED ([facility], [facility], [facility] ONLY)  POC SARS CORONAVIRUS 2 AG -  ED                                                                                                                         EKG  EKG Interpretation  Date/Time:    Ventricular Rate:    PR Interval:    QRS Duration:   QT Interval:    QTC Calculation:   R Axis:     Text Interpretation:        Radiology DG Chest 2 View  Result Date: 08/08/2020 CLINICAL DATA:  Altered mental status EXAM: CHEST - 2 VIEW COMPARISON:  07/24/2013 FINDINGS: The heart size and mediastinal contours are within normal  limits. Both lungs are clear. The visualized skeletal structures are unremarkable. IMPRESSION: Normal study. Electronically Signed   By: Rolm Baptise M.D.   On: 08/08/2020 03:29    Pertinent labs & imaging results that were available during my care of the patient were reviewed by me and considered in my medical decision making (see chart for details).  Medications Ordered in ED Medications  ARIPiprazole (ABILIFY) tablet 10 mg (has no administration in time range)  atorvastatin (LIPITOR) tablet 80 mg (has no administration in time range)  dapagliflozin propanediol (FARXIGA) tablet 5 mg (has no  administration in time range)  gabapentin (NEURONTIN) capsule 100 mg (has no administration in time range)  liraglutide (VICTOZA) SOPN 1.8 mg (has no administration in time range)  lisinopril (ZESTRIL) tablet 5 mg (has no administration in time range)  metFORMIN (GLUCOPHAGE) tablet 1,000 mg (has no administration in time range)  lisinopril (ZESTRIL) tablet 5 mg (5 mg Oral Given 08/08/20 0347)  metFORMIN (GLUCOPHAGE) tablet 1,000 mg (1,000 mg Oral Given 08/08/20 0347)  acetaminophen (TYLENOL) tablet 1,000 mg (1,000 mg Oral Given 08/08/20 0347)                                                                                                                                    Procedures Procedures  (including critical care time)  Medical Decision Making / ED Course I have reviewed the nursing notes for this encounter and the patient's prior records (if available in EHR or on provided paperwork).   Jacia Sickman was evaluated in Emergency Department on 08/08/2020 for the symptoms described in the history of present illness. She was evaluated in the context of the global COVID-19 pandemic, which necessitated consideration that the patient might be at risk for infection with the SARS-CoV-2 virus that causes COVID-19. Institutional protocols and algorithms that pertain to the evaluation of patients at risk for COVID-19  are in a state of rapid change based on information released by regulatory bodies including the CDC and federal and state organizations. These policies and algorithms were followed during the patient's care in the ED.  Screening labs reassuring. Medically cleared for [facility] dispo. Home meds ordered.      Final Clinical Impression(s) / ED Diagnoses Final diagnoses:  Tachycardia  Psychosis, unspecified psychosis type ([place])      This chart was dictated using voice recognition software.  Despite best efforts to proofread,  errors can occur which can change the documentation meaning.   Fatima Blank, MD 08/08/20 [phone]

## 2020-08-08 NOTE — ED Notes (Signed)
Pt not answering questions, staring into space, awake & responsive.
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
## 2020-08-08 NOTE — BH Assessment (Signed)
Thurmond Butts RN notified through secure chat that per Waylan Boga, DNP, patient is recommended for inpatient treatment.

## 2020-08-08 NOTE — ED Notes (Signed)
[facility] transport requested, called [facility] Charge Nurse phone no answer.  PA Margorie John called Dr Leonette Monarch, accepted pt to facility for evaluation.

## 2020-08-08 NOTE — BH Assessment (Signed)
Update: Spoke with patient sister Jayani Rozman who reports speaking with patient yesterday around 2:30pm. Sister reports that patient seemed ok and at base line. Sister reports that patient was out with someone possibly at a store and patient got of the phone rather quick with plans to call her back. Sister reports patient and her daughter is having conflict which has been going on for about 4 months. Reports daughter called 911 on patient due to mental health issues about four months ago and patient daughter has moved out since. Sister reports that patient daughter birthday is today and possibly thinks that could be an added trigger. Sister also reports history of traumatic event that happened to patient about 13 years ago after hearing her best friend was murdered and buried in the ground and then witnessed her best friend being dug up out the ground. Sister state that the person that committed the murder got out of jail last year which triggered a relapse for patient. Sister reports that patient is not always compliant with her medications and has been inpatient a few times due to non-compliance with medications. Sister states that patient was doing well and surprised to hear about the relapse because patient was working and taking care of herself. Sister would like to be notified, if possible, about patient treatment.

## 2020-08-08 NOTE — ED Notes (Signed)
Pt dressed out in scrubs and wanded by security.

## 2020-08-09 NOTE — ED Notes (Signed)
Pt up currently using the phone. Pt alert and oriented this morning. Denies any pain, SI/HI this morning. Breakfast tray arrived and set up for pt. Will continue to monitor.

## 2020-08-09 NOTE — ED Notes (Signed)
This RN went to check on pt and pt was gone, belongings gone and bed empty. This RN went to speak to ED provider Dr. Reather Converse who had just spoken to the pt. Dr. Reather Converse gave the NT permission to give pt her belongings and cell phone so that she could make a phone call to see if pt had a safe ride and then also because Dr. Reather Converse wanted to speak to a family member as well. This was not relayed to the RN and when the NT gave pt her belongings she packed her bag and left. Dr. Reather Converse notified that pt left.

## 2020-08-09 NOTE — ED Notes (Signed)
Breakfast order placed ?

## 2020-08-13 ENCOUNTER — Other Ambulatory Visit: Payer: Self-pay

## 2020-08-13 ENCOUNTER — Emergency Department (HOSPITAL_COMMUNITY)
Admission: EM | Admit: 2020-08-13 | Discharge: 2020-08-16 | Disposition: A | Discharge status: Home / Self Care | Payer: Medicaid Other | Attending: Emergency Medicine | Admitting: Emergency Medicine

## 2020-08-13 DIAGNOSIS — Z046 Encounter for general psychiatric examination, requested by authority: Secondary | ICD-10-CM | POA: Diagnosis present

## 2020-08-13 DIAGNOSIS — R440 Auditory hallucinations: Secondary | ICD-10-CM | POA: Diagnosis not present

## 2020-08-13 DIAGNOSIS — Z20822 Contact with and (suspected) exposure to covid-19: Secondary | ICD-10-CM | POA: Diagnosis not present

## 2020-08-13 DIAGNOSIS — Z79899 Other long term (current) drug therapy: Secondary | ICD-10-CM | POA: Diagnosis not present

## 2020-08-13 DIAGNOSIS — F17210 Nicotine dependence, cigarettes, uncomplicated: Secondary | ICD-10-CM | POA: Insufficient documentation

## 2020-08-13 DIAGNOSIS — Z7984 Long term (current) use of oral hypoglycemic drugs: Secondary | ICD-10-CM | POA: Insufficient documentation

## 2020-08-13 DIAGNOSIS — E1169 Type 2 diabetes mellitus with other specified complication: Secondary | ICD-10-CM | POA: Insufficient documentation

## 2020-08-13 DIAGNOSIS — F250 Schizoaffective disorder, bipolar type: Secondary | ICD-10-CM | POA: Diagnosis present

## 2020-08-13 DIAGNOSIS — I10 Essential (primary) hypertension: Secondary | ICD-10-CM | POA: Diagnosis not present

## 2020-08-13 DIAGNOSIS — Z794 Long term (current) use of insulin: Secondary | ICD-10-CM | POA: Diagnosis not present

## 2020-08-13 DIAGNOSIS — F319 Bipolar disorder, unspecified: Secondary | ICD-10-CM | POA: Diagnosis present

## 2020-08-13 LAB — COMPREHENSIVE METABOLIC PANEL
ALT: 18 U/L (ref 0–44)
AST: 14 U/L — ABNORMAL LOW (ref 15–41)
Albumin: 3.9 g/dL (ref 3.5–5.0)
Alkaline Phosphatase: 83 U/L (ref 38–126)
Anion gap: 15 (ref 5–15)
BUN: 12 mg/dL (ref 6–20)
CO2: 22 mmol/L (ref 22–32)
Calcium: 9.4 mg/dL (ref 8.9–10.3)
Chloride: 101 mmol/L (ref 98–111)
Creatinine, Ser: 0.63 mg/dL (ref 0.44–1.00)
GFR, Estimated: >60 mL/min (ref >60)
Glucose, Bld: 134 mg/dL — ABNORMAL HIGH (ref 70–99)
Potassium: 3.3 mmol/L — ABNORMAL LOW (ref 3.5–5.1)
Sodium: 138 mmol/L (ref 135–145)
Total Bilirubin: 1 mg/dL (ref 0.3–1.2)
Total Protein: 7.6 g/dL (ref 6.5–8.1)

## 2020-08-13 LAB — RESP PANEL BY RT-PCR (FLU A&B, COVID) ARPGX2
Influenza A by PCR: NEGATIVE
Influenza B by PCR: NEGATIVE
SARS Coronavirus 2 by RT PCR: NEGATIVE

## 2020-08-13 LAB — CBC WITH DIFFERENTIAL/PLATELET
Abs Immature Granulocytes: 0.03 10*3/uL (ref 0.00–0.07)
Basophils Absolute: 0.1 10*3/uL (ref 0.0–0.1)
Basophils Relative: 1 %
Eosinophils Absolute: 0 10*3/uL (ref 0.0–0.5)
Eosinophils Relative: 0 %
HCT: 37 % (ref 36.0–46.0)
Hemoglobin: 11.3 g/dL — ABNORMAL LOW (ref 12.0–15.0)
Immature Granulocytes: 0 %
Lymphocytes Relative: 20 %
Lymphs Abs: 1.9 10*3/uL (ref 0.7–4.0)
MCH: 23.8 pg — ABNORMAL LOW (ref 26.0–34.0)
MCHC: 30.5 g/dL (ref 30.0–36.0)
MCV: 78.1 fL — ABNORMAL LOW (ref 80.0–100.0)
Monocytes Absolute: 0.9 10*3/uL (ref 0.1–1.0)
Monocytes Relative: 9 %
Neutro Abs: 6.6 10*3/uL (ref 1.7–7.7)
Neutrophils Relative %: 70 %
Platelets: 465 10*3/uL — ABNORMAL HIGH (ref 150–400)
RBC: 4.74 MIL/uL (ref 3.87–5.11)
RDW: 17.9 % — ABNORMAL HIGH (ref 11.5–15.5)
WBC: 9.5 10*3/uL (ref 4.0–10.5)
nRBC: 0 % (ref 0.0–0.2)

## 2020-08-13 LAB — CBG MONITORING, ED
Glucose-Capillary: 149 mg/dL — ABNORMAL HIGH (ref 70–99)
Glucose-Capillary: 136 mg/dL — ABNORMAL HIGH (ref 70–99)

## 2020-08-13 LAB — I-STAT BETA HCG BLOOD, ED (MC, WL, AP ONLY): I-stat hCG, quantitative: <5 m[IU]/mL (ref <5)

## 2020-08-13 LAB — ETHANOL: Alcohol, Ethyl (B): <10 mg/dL (ref <10)

## 2020-08-13 LAB — SALICYLATE LEVEL: Salicylate Lvl: <7 mg/dL — ABNORMAL LOW (ref 7.0–30.0)

## 2020-08-13 LAB — ACETAMINOPHEN LEVEL: Acetaminophen (Tylenol), Serum: <10 ug/mL — ABNORMAL LOW (ref 10–30)

## 2020-08-13 MED ORDER — METFORMIN HCL 500 MG PO TABS
1000.0000 mg | ORAL_TABLET | Freq: Two times a day (BID) | ORAL | Status: DC
  Administered 2020-08-13 – 2020-08-16 (×6): 1000 mg via ORAL
  Filled 2020-08-13 (×7): qty 2

## 2020-08-13 MED ORDER — LISINOPRIL 10 MG PO TABS
5.0000 mg | ORAL_TABLET | Freq: Every day | ORAL | Status: DC
  Administered 2020-08-15 – 2020-08-16 (×2): 5 mg via ORAL
  Filled 2020-08-13 (×4): qty 1

## 2020-08-13 MED ORDER — ACETAMINOPHEN 325 MG PO TABS
650.0000 mg | ORAL_TABLET | ORAL | Status: DC | PRN
  Administered 2020-08-15: 650 mg via ORAL
  Filled 2020-08-13 (×2): qty 2

## 2020-08-13 MED ORDER — ONDANSETRON HCL 4 MG PO TABS: 4.0000 mg | ORAL_TABLET | Freq: Three times a day (TID) | ORAL | Status: DC | PRN

## 2020-08-13 MED ORDER — INSULIN ASPART 100 UNIT/ML ~~LOC~~ SOLN
0.0000 [IU] | Freq: Every day | SUBCUTANEOUS | Status: DC
  Administered 2020-08-14: 2 [IU] via SUBCUTANEOUS
  Filled 2020-08-13: qty 0.05

## 2020-08-13 MED ORDER — ATORVASTATIN CALCIUM 40 MG PO TABS
80.0000 mg | ORAL_TABLET | Freq: Every day | ORAL | Status: DC
  Administered 2020-08-15 – 2020-08-16 (×2): 80 mg via ORAL
  Filled 2020-08-13 (×3): qty 2

## 2020-08-13 MED ORDER — ZOLPIDEM TARTRATE 5 MG PO TABS
5.0000 mg | ORAL_TABLET | Freq: Every evening | ORAL | Status: DC | PRN
  Administered 2020-08-16: 5 mg via ORAL
  Filled 2020-08-13: qty 1

## 2020-08-13 MED ORDER — ARIPIPRAZOLE 10 MG PO TABS
10.0000 mg | ORAL_TABLET | Freq: Every day | ORAL | Status: DC
  Administered 2020-08-15 – 2020-08-16 (×2): 10 mg via ORAL
  Filled 2020-08-13 (×3): qty 1

## 2020-08-13 MED ORDER — INSULIN ASPART 100 UNIT/ML ~~LOC~~ SOLN
0.0000 [IU] | Freq: Three times a day (TID) | SUBCUTANEOUS | Status: DC
  Administered 2020-08-14 – 2020-08-15 (×4): 3 [IU] via SUBCUTANEOUS
  Administered 2020-08-15 – 2020-08-16 (×5): 5 [IU] via SUBCUTANEOUS
  Filled 2020-08-13: qty 0.15

## 2020-08-13 MED ORDER — DIVALPROEX SODIUM 250 MG PO DR TAB
250.0000 mg | DELAYED_RELEASE_TABLET | Freq: Two times a day (BID) | ORAL | Status: DC
Start: 2020-08-13 — End: 2020-08-16
  Administered 2020-08-14 – 2020-08-16 (×4): 250 mg via ORAL
  Filled 2020-08-13 (×6): qty 1

## 2020-08-13 NOTE — ED Triage Notes (Signed)
Pt to [facility] from home by EMS pt was wandering around apartment. Confused. Not making sense. Pt really talking or answering questions. Pt has psych hx.

## 2020-08-13 NOTE — ED Notes (Signed)
Pt not responding to questions.  Labile with emotions. Tearful at times. Rolling her eyes back. Pt not eating or drinking with out prompting. Pt redirected and reassured.

## 2020-08-13 NOTE — ED Provider Notes (Signed)
[facility] DEPT Provider Note   CSN: [id number] Arrival date & time: 08/13/20  1346     History Chief Complaint  Patient presents with  . Psychiatric Evaluation    [name] is a 45 y.o. female.  HPI Patient is a 45 year old female  Patient has a past medical history significant for bipolar disorder.  She is on medications for this.  Uncertain whether she has been taking them.  Patient overall seems calm but is not answering questions.  Level 5 caveat due to psychosis.  Patient is not answering any questions.  Per patient's history we talked to over the phone patient has a history of bipolar disorder.  There is some concern that she has not taking her medications presently.  Her sister states that she is under a lot of stress notably financially but also in other ways. Sister states that she has been hospitalized for similar symptoms in the past.      Past Medical History:  Diagnosis Date  . Abnormal Pap smear   . Bipolar 1 disorder ([place])   . Diabetes mellitus without complication ([place])   . Fibroids   . Hypertension   . IBS (irritable bowel syndrome)     Patient Active Problem List   Diagnosis Date Noted  . Essential hypertension 04/08/2019  . Class 2 severe obesity due to excess calories with serious comorbidity and body mass index (BMI) of 39.0 to 39.9 in adult ([place]) 04/08/2019  . Tobacco dependence 04/08/2019  . Schizoaffective disorder, bipolar type ([place]) 05/18/2018  . Diabetes mellitus ([place]) 08/10/2016  . Bipolar disorder, curr episode mixed, severe, with psychotic features ([place]) 08/07/2016  . Cannabis use disorder, moderate, dependence ([place]) 08/07/2016  . CHOLECYSTITIS, UNSPECIFIED 06/12/2008  . BILIARY COLIC 93/23/5573  . CERUMEN IMPACTION, BILATERAL 01/17/2008  . PHARYNGITIS, VIRAL 01/17/2008  . NECK PAIN 01/17/2008    Past Surgical History:  Procedure Laterality Date  . CERVICAL BIOPSY    . CHOLECYSTECTOMY    . TUBAL  LIGATION       OB History    Gravida  7   Para  2   Term  2   Preterm      AB  5   Living  2     SAB      IAB  4   Ectopic  1   Multiple      Live Births              Family History  Problem Relation Age of Onset  . Diabetes Father   . Diabetes Paternal Grandmother   . Diabetes Maternal Grandmother   . Mental illness Cousin   . Healthy Mother     Social History   Tobacco Use  . Smoking status: Current Every Day Smoker    Packs/day: 1.00    Years: 3.00    Pack years: 3.00    Types: Cigarettes, E-cigarettes  . Smokeless tobacco: Never Used  Vaping Use  . Vaping Use: Never used  Substance Use Topics  . Alcohol use: Yes    Comment: occasional  . Drug use: Yes    Types: Marijuana    Home Medications Prior to Admission medications   Medication Sig Start Date End Date Taking? Authorizing Provider  Accu-Chek Softclix Lancets lancets Use to check FSBS BID. Dx: E11.42 03/11/20   [name] Bang, DO  ARIPiprazole (ABILIFY) 10 MG tablet Take 1 tablet (10 mg total) by mouth daily. 06/15/20   [name] Bang,  DO  atorvastatin (LIPITOR) 80 MG tablet Take 1 tablet (80 mg total) by mouth daily. 03/11/20   [name] Bang, DO  Blood Glucose Monitoring Suppl (ACCU-CHEK GUIDE ME) w/Device KIT Use to check FSBS BID. Dx: E11.42 03/11/20   [name] Bang, DO  dapagliflozin propanediol (FARXIGA) 5 MG TABS tablet Take 1 tablet (5 mg total) by mouth daily before breakfast. 06/23/20   Camillia Herter, NP  divalproex (DEPAKOTE) 250 MG DR tablet Take 250 mg by mouth 2 (two) times daily. 04/22/20   [provider]  gabapentin (NEURONTIN) 100 MG capsule Take 1 capsule (100 mg total) by mouth 3 (three) times daily. 03/11/20   [name] Bang, DO  glucose blood (ACCU-CHEK GUIDE) test strip Use to check FSBS BID. Dx: E11.42 03/11/20   [name] Bang, DO  Insulin Pen Needle (TRUEPLUS PEN NEEDLES) 32G X 4 MM MISC Use as  instructed to inject Victoza daily. 03/11/20   [name] Bang, DO  liraglutide (VICTOZA) 18 MG/3ML SOPN Inject 1.8 mg into the skin daily. 06/15/20   [name] Bang, DO  lisinopril (ZESTRIL) 5 MG tablet Take 1 tablet (5 mg total) by mouth daily. 03/11/20   [name] Bang, DO  metFORMIN (GLUCOPHAGE) 1000 MG tablet Take 1 tablet (1,000 mg total) by mouth 2 (two) times daily with a meal. 06/23/20   Minette Brine, Amy J, NP  ondansetron (ZOFRAN ODT) 4 MG disintegrating tablet Take 1 tablet (4 mg total) by mouth every 8 (eight) hours as needed for nausea or vomiting. 07/03/20   Suzy Bouchard, PA-C    Allergies    Patient has no known allergies.  Review of Systems   Review of Systems  Unable to perform ROS: Psychiatric disorder (will not respond to questions -- will whisper occasionally )    Physical Exam Updated Vital Signs BP (!) 164/95 (BP Location: Right Arm)   Pulse (!) 112   Temp 99.2 F (37.3 C) (Oral)   Resp 15   SpO2 99%   Physical Exam Vitals and nursing note reviewed.  Constitutional:      General: She is not in acute distress.    Appearance: She is obese.  HENT:     Head: Normocephalic and atraumatic.     Nose: Nose normal.  Eyes:     General: No scleral icterus. Cardiovascular:     Rate and Rhythm: Regular rhythm. Tachycardia present.     Pulses: Normal pulses.     Heart sounds: Normal heart sounds.     Comments: HR ~100 on exam Pulmonary:     Effort: Pulmonary effort is normal. No respiratory distress.     Breath sounds: Normal breath sounds. No wheezing.  Abdominal:     Palpations: Abdomen is soft.     Tenderness: There is no abdominal tenderness. There is no guarding or rebound.  Musculoskeletal:     Cervical back: Normal range of motion.     Right lower leg: No edema.     Left lower leg: No edema.  Skin:    General: Skin is warm and dry.     Capillary Refill: Capillary refill takes less than 2 seconds.  Neurological:      Mental Status: She is alert. Mental status is at baseline.  Psychiatric:     Comments: Poor eye contact, whispers occasionally and does not answer questions.     ED Results / Procedures / Treatments   Labs (all labs ordered are listed, but only abnormal results are  displayed) Labs Reviewed  COMPREHENSIVE METABOLIC PANEL - Abnormal; Notable for the following components:      Result Value   Potassium 3.3 (*)    Glucose, Bld 134 (*)    AST 14 (*)    All other components within normal limits  CBC WITH DIFFERENTIAL/PLATELET - Abnormal; Notable for the following components:   Hemoglobin 11.3 (*)    MCV 78.1 (*)    MCH 23.8 (*)    RDW 17.9 (*)    Platelets 465 (*)    All other components within normal limits  ACETAMINOPHEN LEVEL - Abnormal; Notable for the following components:   Acetaminophen (Tylenol), Serum <10 (*)    All other components within normal limits  SALICYLATE LEVEL - Abnormal; Notable for the following components:   Salicylate Lvl <7.6 (*)    All other components within normal limits  CBG MONITORING, ED - Abnormal; Notable for the following components:   Glucose-Capillary 149 (*)    All other components within normal limits  RESP PANEL BY RT-PCR (FLU A&B, COVID) ARPGX2  ETHANOL  RAPID URINE DRUG SCREEN, HOSP PERFORMED  I-STAT BETA HCG BLOOD, ED ([facility], [facility], [facility] ONLY)    EKG EKG Interpretation  Date/Time:  Friday August 13 2020 15:31:32 EST Ventricular Rate:  98 PR Interval:  148 QRS Duration: 86 QT Interval:  356 QTC Calculation: 454 R Axis:   -7 Text Interpretation: Normal sinus rhythm Normal ECG No acute changes No significant change since last tracing Confirmed by Varney Biles [phone]) on 08/13/2020 5:05:54 PM   Radiology No results found.  Procedures Procedures   Medications Ordered in ED Medications  ondansetron (ZOFRAN) tablet 4 mg (has no administration in time range)  zolpidem (AMBIEN) tablet 5 mg (has no administration in time range)   acetaminophen (TYLENOL) tablet 650 mg (has no administration in time range)  ARIPiprazole (ABILIFY) tablet 10 mg (10 mg Oral Not Given 08/13/20 1721)  atorvastatin (LIPITOR) tablet 80 mg (80 mg Oral Not Given 08/13/20 1722)  metFORMIN (GLUCOPHAGE) tablet 1,000 mg (1,000 mg Oral Given 08/13/20 1912)  lisinopril (ZESTRIL) tablet 5 mg (5 mg Oral Not Given 08/13/20 1722)  divalproex (DEPAKOTE) DR tablet 250 mg (has no administration in time range)  insulin aspart (novoLOG) injection 0-15 Units (has no administration in time range)  insulin aspart (novoLOG) injection 0-5 Units (has no administration in time range)    ED Course  I have reviewed the triage vital signs and the nursing notes.  Pertinent labs & imaging results that were available during my care of the patient were reviewed by me and considered in my medical decision making (see chart for details).    MDM Rules/Calculators/A&P                          Patient is 45 year old female with history of bipolar disorder presented today with what seems to be an exacerbation of her normal symptoms.  It seems like in the past she has had presentations where she was tachycardic and reported hearing voices and seeing people however given that she is refusing to talk presently I am unable to ascertain whether this is in keeping with her current symptoms.  Patient is mildly tachycardic for heart rate around 100.  She is overall well-appearing does not appear to be in any acute distress is not tachypneic or uncomfortable appearing.  We will obtain basic med clearance labs.  Home medications ordered--for hyperglycemia will provide patient with Metformin and  sliding scale insulin.  Given that I am uncertain of her medication compliance I am hesitant to provide patient w/ all of her prescribed antihyperglycemics at once.  EKG obtained due to patient's mild tachycardia.  No ST-T wave abnormalities.  No evidence of ischemia.  Tylenol salicylate and  i-STAT hCG undetectable/not pregnant.  Covid test pending at this time.  CMP unremarkable with only mild hypokalemia.  CBC with no significant leukocytosis and anemia currently different from her baseline.  Patient is awaiting psych consultation. Covid test pending at this time.  Patient is otherwise medically cleared.  Covid test will alter but not change disposition.  Patient is not under IVC at this time.  Final Clinical Impression(s) / ED Diagnoses Final diagnoses:  None    Rx / DC Orders ED Discharge Orders    None       Tedd Sias, [place] 08/13/20 2019    Varney Biles, MD 08/15/20 1549

## 2020-08-13 NOTE — ED Notes (Signed)
Pt changed out into wine scrubs and yellow socks. Pt's belongings (a pair of brown/black shoes, a gold sweater jacket, a blue dress, a black long sleeve shirt and a red wrap around skirt. In locker #31

## 2020-08-14 LAB — RAPID URINE DRUG SCREEN, HOSP PERFORMED
Amphetamines: NOT DETECTED
Barbiturates: NOT DETECTED
Benzodiazepines: NOT DETECTED
Cocaine: NOT DETECTED
Opiates: NOT DETECTED
Tetrahydrocannabinol: POSITIVE — AB

## 2020-08-14 LAB — CBG MONITORING, ED
Glucose-Capillary: 168 mg/dL — ABNORMAL HIGH (ref 70–99)
Glucose-Capillary: 195 mg/dL — ABNORMAL HIGH (ref 70–99)
Glucose-Capillary: 181 mg/dL — ABNORMAL HIGH (ref 70–99)
Glucose-Capillary: 186 mg/dL — ABNORMAL HIGH (ref 70–99)
Glucose-Capillary: 218 mg/dL — ABNORMAL HIGH (ref 70–99)

## 2020-08-14 LAB — VALPROIC ACID LEVEL: Valproic Acid Lvl: <10 ug/mL — ABNORMAL LOW (ref 50.0–100.0)

## 2020-08-14 MED ORDER — ZIPRASIDONE MESYLATE 20 MG IM SOLR
20.0000 mg | Freq: Once | INTRAMUSCULAR | Status: AC
  Administered 2020-08-14: 20 mg via INTRAMUSCULAR
  Filled 2020-08-14: qty 20

## 2020-08-14 MED ORDER — STERILE WATER FOR INJECTION IJ SOLN
INTRAMUSCULAR | Status: AC
  Administered 2020-08-14: 1.2 mL
  Filled 2020-08-14: qty 10

## 2020-08-14 NOTE — ED Notes (Signed)
Patient is not responding verbally to nurse. She does however respond to noxious stimuli. PA Lattie Haw made aware and recommends we continue monitoring.  MD made aware about BP.

## 2020-08-14 NOTE — ED Provider Notes (Addendum)
Emergency Medicine Observation Re-evaluation Note  [name] is a 45 y.o. female, seen on rounds today.  Pt initially presented to the ED for complaints of Psychiatric Evaluation Currently, the patient is resting in bed but was up to bathroom on initial evaluation.  Physical Exam  BP (!) 160/99 (BP Location: Right Arm)   Pulse (!) 113   Temp 98.7 F (37.1 C) (Oral)   Resp 20   SpO2 99%  Physical Exam General: wd female staring  Cardiac: rrr Lungs: cta Psych: affect flat  ED Course / MDM  EKG:EKG Interpretation  Date/Time:  Friday August 13 2020 15:31:32 EST Ventricular Rate:  98 PR Interval:  148 QRS Duration: 86 QT Interval:  356 QTC Calculation: 863 R Axis:   -7 Text Interpretation: Normal sinus rhythm Normal ECG No acute changes No significant change since last tracing Confirmed by Varney Biles [phone]) on 08/13/2020 5:05:54 PM    I have reviewed the labs performed to date as well as medications administered while in observation.  Recent changes in the last 24 hours include none. Patient very slowed. Reviewed chart for etiology Labs normal Patient on neurontin but not taking here x 20 hours Depakote level ordered. depakote level <10 - unlikely cause of decreased mentation Plan  Current plan is for inpatient. Patient is not under full IVC at this time.   Alyssa Boss, MD 08/14/20 1657    Alyssa Boss, MD 08/14/20 1230

## 2020-08-14 NOTE — Consult Note (Signed)
Telepsych Consultation   Reason for Consult: Psychiatry provider reassessment Referring Physician: Dr. Dwyane Luo Location of Patient: [name] emergency department Location of Provider: [place] Department  Patient Identification: [name] MRN:  [id number] Principal Diagnosis: Schizoaffective disorder, bipolar type [facility]) Diagnosis:  Principal Problem:   Schizoaffective disorder, bipolar type ([place])   Total Time spent with patient: 20 minutes  Subjective:   [name] is a 45 y.o. female patient admitted after being found wandering and confused near her apartment.  HPI:   Nurse practitioner Attempted to assess patient today. Patient appeared to pretend to be asleep by closing her eyes and turning away from the monitor.  Patient is alert. patient is not cooperative with assessment at this time.  Patient appears to be responding to internal stimuli, noted to be pointing toward unoccupied areas of the room and gesturing incoherently with hands.  Patient may be experiencing thought blocking as she is able to articulate per day with noted delay and apparently slowed speech.  Patient does attempt to participate in assessment when encouraged by attending nurse.  Patient refuses to participate further in assessment.  Patient offered support and encouragement.  Past Psychiatric History: Schizoaffective disorder, bipolar type, cannabis use disorder  Risk to Self:   Risk to Others:   Prior Inpatient Therapy:   Prior Outpatient Therapy:    Past Medical History:  Past Medical History:  Diagnosis Date  . Abnormal Pap smear   . Bipolar 1 disorder ([place])   . Diabetes mellitus without complication ([place])   . Fibroids   . Hypertension   . IBS (irritable bowel syndrome)     Past Surgical History:  Procedure Laterality Date  . CERVICAL BIOPSY    . CHOLECYSTECTOMY    . TUBAL LIGATION     Family History:  Family History  Problem Relation Age of Onset  . Diabetes Father   .  Diabetes Paternal Grandmother   . Diabetes Maternal Grandmother   . Mental illness Cousin   . Healthy Mother    Family Psychiatric  History: None reported Social History:  Social History   Substance and Sexual Activity  Alcohol Use Yes   Comment: occasional     Social History   Substance and Sexual Activity  Drug Use Yes  . Types: Marijuana    Social History   Socioeconomic History  . Marital status: Married    Spouse name: Not on file  . Number of children: 2  . Years of education: Not on file  . Highest education level: Master's degree (e.g., MA, MS, MEng, MEd, MSW, MBA)  Occupational History  . Not on file  Tobacco Use  . Smoking status: Current Every Day Smoker    Packs/day: 1.00    Years: 3.00    Pack years: 3.00    Types: Cigarettes, E-cigarettes  . Smokeless tobacco: Never Used  Vaping Use  . Vaping Use: Never used  Substance and Sexual Activity  . Alcohol use: Yes    Comment: occasional  . Drug use: Yes    Types: Marijuana  . Sexual activity: Not Currently    Birth control/protection: Surgical  Other Topics Concern  . Not on file  Social History Narrative  . Not on file   Social Determinants of Health   Financial Resource Strain: Not on file  Food Insecurity: Not on file  Transportation Needs: Not on file  Physical Activity: Not on file  Stress: Not on file  Social Connections: Not on file  Additional Social History:    Allergies:  No Known Allergies  Labs:  Results for orders placed or performed during the hospital encounter of 08/13/20 (from the past 48 hour(s))  CBG monitoring, ED     Status: Abnormal   Collection Time: 08/13/20  4:10 PM  Result Value Ref Range   Glucose-Capillary 149 (H) 70 - 99 mg/dL    Comment: Glucose reference range applies only to samples taken after fasting for at least 8 hours.  Comprehensive metabolic panel     Status: Abnormal   Collection Time: 08/13/20  6:02 PM  Result Value Ref Range   Sodium 138 135 -  145 mmol/L   Potassium 3.3 (L) 3.5 - 5.1 mmol/L   Chloride 101 98 - 111 mmol/L   CO2 22 22 - 32 mmol/L   Glucose, Bld 134 (H) 70 - 99 mg/dL    Comment: Glucose reference range applies only to samples taken after fasting for at least 8 hours.   BUN 12 6 - 20 mg/dL   Creatinine, Ser 0.63 0.44 - 1.00 mg/dL   Calcium 9.4 8.9 - 10.3 mg/dL   Total Protein 7.6 6.5 - 8.1 g/dL   Albumin 3.9 3.5 - 5.0 g/dL   AST 14 (L) 15 - 41 U/L   ALT 18 0 - 44 U/L   Alkaline Phosphatase 83 38 - 126 U/L   Total Bilirubin 1.0 0.3 - 1.2 mg/dL   GFR, Estimated >60 >60 mL/min    Comment: (NOTE) Calculated using the CKD-EPI Creatinine Equation (2021)    Anion gap 15 5 - 15    Comment: Performed at [facility], [place] [address] [place], [place] [address]  CBC with Diff     Status: Abnormal   Collection Time: 08/13/20  6:02 PM  Result Value Ref Range   WBC 9.5 4.0 - 10.5 K/uL   RBC 4.74 3.87 - 5.11 MIL/uL   Hemoglobin 11.3 (L) 12.0 - 15.0 g/dL   HCT 37.0 36.0 - 46.0 %   MCV 78.1 (L) 80.0 - 100.0 fL   MCH 23.8 (L) 26.0 - 34.0 pg   MCHC 30.5 30.0 - 36.0 g/dL   RDW 17.9 (H) 11.5 - 15.5 %   Platelets 465 (H) 150 - 400 K/uL   nRBC 0.0 0.0 - 0.2 %   Neutrophils Relative % 70 %   Neutro Abs 6.6 1.7 - 7.7 K/uL   Lymphocytes Relative 20 %   Lymphs Abs 1.9 0.7 - 4.0 K/uL   Monocytes Relative 9 %   Monocytes Absolute 0.9 0.1 - 1.0 K/uL   Eosinophils Relative 0 %   Eosinophils Absolute 0.0 0.0 - 0.5 K/uL   Basophils Relative 1 %   Basophils Absolute 0.1 0.0 - 0.1 K/uL   Immature Granulocytes 0 %   Abs Immature Granulocytes 0.03 0.00 - 0.07 K/uL    Comment: Performed at [facility], [place] [address] [place], [place] [address]  Ethanol     Status: None   Collection Time: 08/13/20  6:03 PM  Result Value Ref Range   Alcohol, Ethyl (B) <10 <10 mg/dL    Comment: (NOTE) Lowest detectable limit for serum alcohol is 10 mg/dL.  For medical purposes only. Performed at [facility], [place] [address] [place], [place] [address]   Acetaminophen level     Status: Abnormal   Collection Time: 08/13/20  6:03 PM  Result Value Ref Range   Acetaminophen (Tylenol), Serum <10 (L) 10 -  30 ug/mL    Comment: (NOTE) Therapeutic concentrations vary significantly. A range of 10-30 ug/mL  may be an effective concentration for many patients. However, some  are best treated at concentrations outside of this range. Acetaminophen concentrations >150 ug/mL at 4 hours after ingestion  and >50 ug/mL at 12 hours after ingestion are often associated with  toxic reactions.  Performed at [facility], [place] [address], [place], [place] [address]   Salicylate level     Status: Abnormal   Collection Time: 08/13/20  6:03 PM  Result Value Ref Range   Salicylate Lvl <0.3 (L) 7.0 - 30.0 mg/dL    Comment: Performed at [facility], [place] [address] [place], [place] [address]  Resp Panel by RT-PCR (Flu A&B, Covid) Nasopharyngeal Swab     Status: None   Collection Time: 08/13/20  6:05 PM   Specimen: Nasopharyngeal Swab; Nasopharyngeal(NP) swabs in vial transport medium  Result Value Ref Range   SARS Coronavirus 2 by RT PCR NEGATIVE NEGATIVE    Comment: (NOTE) SARS-CoV-2 target nucleic acids are NOT DETECTED.  The SARS-CoV-2 RNA is generally detectable in upper respiratory specimens during the acute phase of infection. The lowest concentration of SARS-CoV-2 viral copies this assay can detect is 138 copies/mL. A negative result does not preclude SARS-Cov-2 infection and should not be used as the sole basis for treatment or other patient management decisions. A negative result may occur with  improper specimen collection/handling, submission of specimen other than nasopharyngeal swab, presence of viral mutation(s) within the areas targeted by this assay, and inadequate number of viral copies(<138 copies/mL). A negative result must be  combined with clinical observations, patient history, and epidemiological information. The expected result is Negative.  Fact Sheet for Patients:  EntrepreneurPulse.com.au  Fact Sheet for Healthcare Providers:  IncredibleEmployment.be  This test is no t yet approved or cleared by the [place] FDA and  has been authorized for detection and/or diagnosis of SARS-CoV-2 by FDA under an Emergency Use Authorization (EUA). This EUA will remain  in effect (meaning this test can be used) for the duration of the COVID-19 declaration under Section 564(b)(1) of the Act, 21 U.S.C.section 360bbb-3(b)(1), unless the authorization is terminated  or revoked sooner.       Influenza A by PCR NEGATIVE NEGATIVE   Influenza B by PCR NEGATIVE NEGATIVE    Comment: (NOTE) The Xpert Xpress SARS-CoV-2/FLU/RSV plus assay is intended as an aid in the diagnosis of influenza from Nasopharyngeal swab specimens and should not be used as a sole basis for treatment. Nasal washings and aspirates are unacceptable for Xpert Xpress SARS-CoV-2/FLU/RSV testing.  Fact Sheet for Patients: EntrepreneurPulse.com.au  Fact Sheet for Healthcare Providers: IncredibleEmployment.be  This test is not yet approved or cleared by the [place] FDA and has been authorized for detection and/or diagnosis of SARS-CoV-2 by FDA under an Emergency Use Authorization (EUA). This EUA will remain in effect (meaning this test can be used) for the duration of the COVID-19 declaration under Section 564(b)(1) of the Act, 21 U.S.C. section 360bbb-3(b)(1), unless the authorization is terminated or revoked.  Performed at [facility], [place] [address] [place], [place] [address]   Rapid urine drug screen (hospital performed)     Status: Abnormal   Collection Time: 08/13/20  6:05 PM  Result Value Ref Range   Opiates NONE DETECTED NONE DETECTED    Cocaine NONE DETECTED NONE DETECTED   Benzodiazepines NONE DETECTED NONE DETECTED   Amphetamines NONE  DETECTED NONE DETECTED   Tetrahydrocannabinol POSITIVE (A) NONE DETECTED   Barbiturates NONE DETECTED NONE DETECTED    Comment: (NOTE) DRUG SCREEN FOR MEDICAL PURPOSES ONLY.  IF CONFIRMATION IS NEEDED FOR ANY PURPOSE, NOTIFY LAB WITHIN 5 DAYS.  LOWEST DETECTABLE LIMITS FOR URINE DRUG SCREEN Drug Class                     Cutoff (ng/mL) Amphetamine and metabolites    1000 Barbiturate and metabolites    200 Benzodiazepine                 428 Tricyclics and metabolites     300 Opiates and metabolites        300 Cocaine and metabolites        300 THC                            50 Performed at [facility], [place] [address] [place], [place] [address]   I-Stat beta hCG blood, ED     Status: None   Collection Time: 08/13/20  6:12 PM  Result Value Ref Range   I-stat hCG, quantitative <5.0 <5 mIU/mL   Comment 3            Comment:   GEST. AGE      CONC.  (mIU/mL)   <=1 WEEK        5 - 50     2 WEEKS       50 - 500     3 WEEKS       100 - 10,000     4 WEEKS     1,000 - 30,000        FEMALE AND NON-PREGNANT FEMALE:     LESS THAN 5 mIU/mL   CBG monitoring, ED     Status: Abnormal   Collection Time: 08/13/20 10:55 PM  Result Value Ref Range   Glucose-Capillary 136 (H) 70 - 99 mg/dL    Comment: Glucose reference range applies only to samples taken after fasting for at least 8 hours.  CBG monitoring, ED     Status: Abnormal   Collection Time: 08/14/20  6:44 AM  Result Value Ref Range   Glucose-Capillary 168 (H) 70 - 99 mg/dL    Comment: Glucose reference range applies only to samples taken after fasting for at least 8 hours.  CBG monitoring, ED     Status: Abnormal   Collection Time: 08/14/20  8:44 AM  Result Value Ref Range   Glucose-Capillary 195 (H) 70 - 99 mg/dL    Comment: Glucose reference range applies only to samples taken after fasting for at least 8  hours.  Valproic acid level     Status: Abnormal   Collection Time: 08/14/20 10:08 AM  Result Value Ref Range   Valproic Acid Lvl <10 (L) 50.0 - 100.0 ug/mL    Comment: Performed at [facility], [place] [address] [place], [place] [address]  CBG monitoring, ED     Status: Abnormal   Collection Time: 08/14/20 11:24 AM  Result Value Ref Range   Glucose-Capillary 181 (H) 70 - 99 mg/dL    Comment: Glucose reference range applies only to samples taken after fasting for at least 8 hours.    Medications:  Current Facility-Administered Medications  Medication Dose Route Frequency Provider Last Rate Last Admin  . acetaminophen (TYLENOL) tablet 650 mg  650 mg Oral Q4H PRN  Pati Gallo S, PA      . ARIPiprazole (ABILIFY) tablet 10 mg  10 mg Oral Daily Fondaw, Wylder S, [place]      . atorvastatin (LIPITOR) tablet 80 mg  80 mg Oral Daily Fondaw, Wylder S, [place]      . divalproex (DEPAKOTE) DR tablet 250 mg  250 mg Oral BID Fondaw, Wylder S, PA      . insulin aspart (novoLOG) injection 0-15 Units  0-15 Units Subcutaneous TID WC Fondaw, Wylder S, PA   3 Units at 08/14/20 1143  . insulin aspart (novoLOG) injection 0-5 Units  0-5 Units Subcutaneous QHS Fondaw, Wylder S, PA      . lisinopril (ZESTRIL) tablet 5 mg  5 mg Oral Daily Fondaw, Wylder S, PA      . metFORMIN (GLUCOPHAGE) tablet 1,000 mg  1,000 mg Oral BID WC Fondaw, Wylder S, PA   1,000 mg at 08/13/20 1912  . ondansetron (ZOFRAN) tablet 4 mg  4 mg Oral Q8H PRN Fondaw, Wylder S, PA      . zolpidem (AMBIEN) tablet 5 mg  5 mg Oral QHS PRN Pati Gallo S, PA       Current Outpatient Medications  Medication Sig Dispense Refill  . Accu-Chek Softclix Lancets lancets Use to check FSBS BID. Dx: E11.42 100 each 2  . ARIPiprazole (ABILIFY) 10 MG tablet Take 1 tablet (10 mg total) by mouth daily. 30 tablet 0  . atorvastatin (LIPITOR) 80 MG tablet Take 1 tablet (80 mg total) by mouth daily. 90 tablet 3  . Blood Glucose Monitoring Suppl  (ACCU-CHEK GUIDE ME) w/Device KIT Use to check FSBS BID. Dx: E11.42 1 kit 0  . dapagliflozin propanediol (FARXIGA) 5 MG TABS tablet Take 1 tablet (5 mg total) by mouth daily before breakfast. 30 tablet 0  . divalproex (DEPAKOTE) 250 MG DR tablet Take 250 mg by mouth 2 (two) times daily.    Marland Kitchen gabapentin (NEURONTIN) 100 MG capsule Take 1 capsule (100 mg total) by mouth 3 (three) times daily. 90 capsule 3  . glucose blood (ACCU-CHEK GUIDE) test strip Use to check FSBS BID. Dx: E11.42 100 each 3  . Insulin Pen Needle (TRUEPLUS PEN NEEDLES) 32G X 4 MM MISC Use as instructed to inject Victoza daily. 100 each 2  . liraglutide (VICTOZA) 18 MG/3ML SOPN Inject 1.8 mg into the skin daily. 6 mL 2  . lisinopril (ZESTRIL) 5 MG tablet Take 1 tablet (5 mg total) by mouth daily. 90 tablet 1  . metFORMIN (GLUCOPHAGE) 1000 MG tablet Take 1 tablet (1,000 mg total) by mouth 2 (two) times daily with a meal. 180 tablet 0  . ondansetron (ZOFRAN ODT) 4 MG disintegrating tablet Take 1 tablet (4 mg total) by mouth every 8 (eight) hours as needed for nausea or vomiting. 20 tablet 0    Musculoskeletal: Strength & Muscle Tone: within normal limits Gait & Station: normal Patient leans: N/A  Psychiatric Specialty Exam: Physical Exam Vitals and nursing note reviewed.  Constitutional:      Appearance: She is well-developed.  HENT:     Head: Normocephalic.  Cardiovascular:     Rate and Rhythm: Normal rate.  Pulmonary:     Effort: Pulmonary effort is normal.  Neurological:     Mental Status: She is alert.  Psychiatric:        Attention and Perception: She is inattentive.        Mood and Affect: Affect is blunt and inappropriate.  Speech: Speech is delayed.        Behavior: Behavior is withdrawn.        Judgment: Judgment is inappropriate.     Review of Systems  Constitutional: Negative.   HENT: Negative.   Eyes: Negative.   Respiratory: Negative.   Cardiovascular: Negative.   Gastrointestinal:  Negative.   Genitourinary: Negative.   Musculoskeletal: Negative.   Skin: Negative.   Neurological: Negative.   Psychiatric/Behavioral: Positive for decreased concentration and dysphoric mood.    Blood pressure (!) 160/99, pulse (!) 113, temperature 98.7 F (37.1 C), temperature source Oral, resp. rate 20, SpO2 99 %.There is no height or weight on file to calculate BMI.  General Appearance: Casual  Eye Contact:  Minimal  Speech:  Slow  Volume:  Decreased  Mood:  Dysphoric  Affect:  Non-Congruent  Thought Process:  Disorganized and Descriptions of Associations: Loose  Orientation:  Other:  self  Thought Content:  Illogical  Suicidal Thoughts:  unable to assess  Homicidal Thoughts:  unable to asses  Memory:  Immediate;   Poor  Judgement:  Impaired  Insight:  Lacking  Psychomotor Activity:  Increased  Concentration:  Concentration: Poor  Recall:  NA  Fund of Knowledge:  Fair  Language:  Fair  Akathisia:  No  Handed:  Right  AIMS (if indicated):     Assets:  Housing Physical Health  ADL's:  Intact  Cognition:  WNL  Sleep:        Treatment Plan Summary: Daily contact with patient to assess and evaluate symptoms and progress in treatment  Home medications initiated.  Consider patient patient under IVC if refusing voluntary inpatient psychiatric admission.  Disposition: Recommend psychiatric Inpatient admission when medically cleared.  This service was provided via telemedicine using a 2-way, interactive audio and video technology.  Names of all persons participating in this telemedicine service and their role in this encounter. Name: Valentina Shaggy Role: Patient  Name: Letitia Libra Role: FNP  Name: Dr Serafina Mitchell Role: Psychiatrist    Emmaline Kluver, FNP 08/14/2020 1:39 PM

## 2020-08-14 NOTE — ED Notes (Signed)
Pt not eating much, has to be fed. No clear thought process at this time. Mumbles at times. Will not talk or respond to questions.  Medication compliant this evening.

## 2020-08-14 NOTE — Progress Notes (Signed)
Per Margorie John, PA, patient meets criteria for inpatient treatment. Family requested a bed for her at [facility] due to transportation issues, however no bed is available today. Therefore, CSW faxed referrals to the following facilities for review:  [facility]  TTS will continue to seek bed placement.   Maxie Better, MSW, LCSW Clinical Social Worker 08/14/2020 12:30 PM

## 2020-08-14 NOTE — ED Notes (Signed)
Patient refused all medications.

## 2020-08-14 NOTE — ED Provider Notes (Signed)
Blood pressure (!) 179/92, pulse (!) 116, temperature 99.2 F (37.3 C), temperature source Oral, resp. rate 18, SpO2 100 %.  In short, [name] is a 45 y.o. female with a chief complaint of Psychiatric Evaluation .  Refer to the original H&P for additional details.  01:30 AM  Made aware by the overnight nurse that the patient is refusing some of her oral medications.  There is a note in the chart that the patient is not responding verbally.  I went to assess the patient and she opens her eyes when I walk in the room.  She is moving purposefully and equally in the upper and lower extremities.  Patient not requiring IM antipsychotic meds at this time with no aggressive or self-injurious behavior. No findings on exam to suspect ICH, CVA, or acute HTN emergency. Presentation fits with patient's history of Bipolar and medication non-compliance. Will follow with TTS evaluation and recommendations. I suspect that once patient is back on her behavioral health medications that she will take her prescribed HTN or other medications.     Margette Fast, MD 08/14/20 [phone]

## 2020-08-14 NOTE — ED Notes (Signed)
Pt alert at times.  Pt acting bizarre . Pt acting like can not walk. Needs help to bathroom. No responding to questions. Rambling, noises at times. Pt refused medication, spit out medication.

## 2020-08-14 NOTE — ED Notes (Signed)
Pt ran to door, easily redirected.

## 2020-08-15 LAB — CBG MONITORING, ED
Glucose-Capillary: 230 mg/dL — ABNORMAL HIGH (ref 70–99)
Glucose-Capillary: 162 mg/dL — ABNORMAL HIGH (ref 70–99)
Glucose-Capillary: 246 mg/dL — ABNORMAL HIGH (ref 70–99)
Glucose-Capillary: 125 mg/dL — ABNORMAL HIGH (ref 70–99)

## 2020-08-15 MED ORDER — STERILE WATER FOR INJECTION IJ SOLN
INTRAMUSCULAR | Status: AC
  Filled 2020-08-15: qty 10

## 2020-08-15 MED ORDER — DOCUSATE SODIUM 100 MG PO CAPS
200.0000 mg | ORAL_CAPSULE | Freq: Once | ORAL | Status: AC
  Administered 2020-08-15: 200 mg via ORAL
  Filled 2020-08-15: qty 2

## 2020-08-15 MED ORDER — ZIPRASIDONE MESYLATE 20 MG IM SOLR
20.0000 mg | Freq: Once | INTRAMUSCULAR | Status: AC
  Administered 2020-08-15: 20 mg via INTRAMUSCULAR
  Filled 2020-08-15: qty 20

## 2020-08-15 NOTE — ED Notes (Signed)
Pt requested to speak with her mother around 10 minutes ago, but another pt was using the phone. Pt mother called and transferred call to cordless phone and gave it to the pt.

## 2020-08-15 NOTE — ED Notes (Signed)
Patient states she wants to leave and is in the room talking to herself. Staff cleaned patient after she had a BM on herself.

## 2020-08-15 NOTE — ED Notes (Addendum)
Pt mother called. I asked the patient if I could speak with her mother, and she said yes. Gave pt mother, Rogelio Seen [phone], an update. Pt mother said, "When she goes into mania sometimes she can't speak, especially when she is scared. She is very scared right now. She is saying she is afraid. She is confused. I don't think she remembers why she is there. She is sometimes paranoid." I asked what we can do to help pt. Her mother said, "There are medicines she takes for bipolar and diabetes. I'm not sure of the medication she takes for her bipolar. I think that's what she needs. She is complaining about pain in her stomach." I asked Earthlene if pt stopped taking her medication. She said she doesn't know, she lives three hours away, and to ask pt boyfriend who might be on the way to [facility]. Earthelene requested to speak with pt again. I transferred the call to her room. Pt is crying and speaking with her mother.

## 2020-08-15 NOTE — BH Assessment (Signed)
[name] is reassessed today to determine if she continues to meet criteria for inpatient treatment. Today patient is oriented to person, place and situation. Patient provides good eye contact; her speech is clear, her mood is somewhat anxious, thoughts are appropriate to mood. Patient concentration is adequate, has some difficulty with recent memory and is complaining of pain in her back when she uses the restroom. Patient state her mood is "erratic" stating that she has been tearful today and feels depressed for unknown reasons. Patient state that she is not sure why she is in the hospital but acknowledges she is diagnosed with bipolar disorder. Patient denies SI/HI and VH but endorses auditory hallucinations that is commentary in nature at times and other times "chit chat". Patient reports voices are present around others. Patient also reports trying to run out the ED last night due to "I thought someone was running after me".         Patient reports sleeping well in ED and having a good appetite. Patient reports receiving outpatient services at [facility] however patient not sure if she missed her virtual appointment on 07/25/20 for medication management. Patient reports medications of ability, metformin and other medications including some over the counter medications. Patient boyfriend visited her today and she reports he was "preachy" and was concerned about her.  Per chart review, patient was seen on 08/07/20 at [facility] for anxiety and stress and transferred to [facility] for medical clearance. Patient left [facility] on 08/09/20 and presented at [facility] by EMS on 08/13/20 due to walking around her apartment, not talking, confusion and not making any sense. Per RN notes patient is presenting labile with emotions, tearful at times, talking to herself and trying to leave ED several times.  Disposition: Per Merlyn Lot, NP, patient continues to meet criteria for INPT treatment.

## 2020-08-15 NOTE — ED Notes (Signed)
Security at bedside. Patient given geodon and depakote. Posey belt also applied.

## 2020-08-15 NOTE — ED Notes (Addendum)
Pt boyfriend, Reggie, at bedside. Pt is calm and reassured. She does not seem fearful or paranoid. The thought blocking she seemed to experience this morning at 7:21 am is no longer noticeable. She took her medication from me and swallowed it immediately after she put it in her mouth.

## 2020-08-15 NOTE — ED Notes (Addendum)
Patient ran to the hall. Patient hopped over the bed rail and bolted for the door. Staff redirected patient back to the room.

## 2020-08-15 NOTE — ED Notes (Deleted)
Soft restraints removed

## 2020-08-15 NOTE — ED Notes (Signed)
Patient has attempted two more times to run from TCU. MD notified and new orders given.

## 2020-08-15 NOTE — BH Assessment (Addendum)
Per Merlyn Lot, NP, patient continues to meet criteria for INPT treatment. Disposition LCSW/Counselor continues to seek appropriate placementt. Re-faxed referrals to the following facilities for consideration bed placement:    [facility]

## 2020-08-15 NOTE — ED Notes (Addendum)
Twenty minutes ago a man called and requested to speak with pt. I transferred call to cordless phone and gave it to the pt. Now pt requesting I call [phone].  Pt is crying. I called the number but no answer. She is requesting I call her mom at [phone]. I called her mom, who answered and identified herself as [name]'s mother, and transferred the call to the cordless phone in pt room.

## 2020-08-15 NOTE — ED Provider Notes (Signed)
Emergency Medicine Observation Re-evaluation Note  [name] is a 45 y.o. female, seen on rounds today.  Pt initially presented to the ED for complaints of Psychiatric Evaluation Currently, the patient is no acute complaints.  Physical Exam  BP (!) 161/100 (BP Location: Right Arm)   Pulse (!) 110   Temp 98.6 F (37 C) (Oral)   Resp 16   SpO2 99%  Physical Exam   ED Course / MDM  EKG:EKG Interpretation  Date/Time:  Friday August 13 2020 15:31:32 EST Ventricular Rate:  98 PR Interval:  148 QRS Duration: 86 QT Interval:  356 QTC Calculation: 754 R Axis:   -7 Text Interpretation: Normal sinus rhythm Normal ECG No acute changes No significant change since last tracing Confirmed by Varney Biles [id number]) on 08/13/2020 5:05:54 PM    I have reviewed the labs performed to date as well as medications administered while in observation.  Recent changes in the last 24 hours include none.  Plan  Current plan is for placement. Patient is not under full IVC at this time.   Lacretia Leigh, MD 08/15/20 [phone]

## 2020-08-15 NOTE — ED Notes (Signed)
Patient took posey belt off.

## 2020-08-15 NOTE — ED Notes (Deleted)
Soft restraints applied

## 2020-08-16 ENCOUNTER — Other Ambulatory Visit: Payer: Self-pay

## 2020-08-16 ENCOUNTER — Encounter: Payer: Self-pay | Admitting: Behavioral Health

## 2020-08-16 ENCOUNTER — Inpatient Hospital Stay
Admission: RE | Admit: 2020-08-16 | Discharge: 2020-08-20 | DRG: 885 | Disposition: A | Discharge status: Home / Self Care | Payer: Medicaid Other | Source: Intra-hospital | Attending: Behavioral Health | Admitting: Behavioral Health

## 2020-08-16 DIAGNOSIS — F17210 Nicotine dependence, cigarettes, uncomplicated: Secondary | ICD-10-CM | POA: Diagnosis present

## 2020-08-16 DIAGNOSIS — I10 Essential (primary) hypertension: Secondary | ICD-10-CM | POA: Diagnosis present

## 2020-08-16 DIAGNOSIS — E119 Type 2 diabetes mellitus without complications: Secondary | ICD-10-CM | POA: Diagnosis present

## 2020-08-16 DIAGNOSIS — Z9049 Acquired absence of other specified parts of digestive tract: Secondary | ICD-10-CM | POA: Diagnosis not present

## 2020-08-16 DIAGNOSIS — E785 Hyperlipidemia, unspecified: Secondary | ICD-10-CM | POA: Diagnosis present

## 2020-08-16 DIAGNOSIS — K589 Irritable bowel syndrome without diarrhea: Secondary | ICD-10-CM | POA: Diagnosis present

## 2020-08-16 DIAGNOSIS — F250 Schizoaffective disorder, bipolar type: Principal | ICD-10-CM | POA: Diagnosis present

## 2020-08-16 DIAGNOSIS — F319 Bipolar disorder, unspecified: Secondary | ICD-10-CM | POA: Diagnosis present

## 2020-08-16 DIAGNOSIS — Z046 Encounter for general psychiatric examination, requested by authority: Secondary | ICD-10-CM | POA: Diagnosis not present

## 2020-08-16 DIAGNOSIS — F1220 Cannabis dependence, uncomplicated: Secondary | ICD-10-CM | POA: Diagnosis present

## 2020-08-16 DIAGNOSIS — E1142 Type 2 diabetes mellitus with diabetic polyneuropathy: Secondary | ICD-10-CM

## 2020-08-16 DIAGNOSIS — Z79899 Other long term (current) drug therapy: Secondary | ICD-10-CM

## 2020-08-16 DIAGNOSIS — Z818 Family history of other mental and behavioral disorders: Secondary | ICD-10-CM | POA: Diagnosis not present

## 2020-08-16 DIAGNOSIS — Z9851 Tubal ligation status: Secondary | ICD-10-CM | POA: Diagnosis not present

## 2020-08-16 DIAGNOSIS — Z7984 Long term (current) use of oral hypoglycemic drugs: Secondary | ICD-10-CM

## 2020-08-16 LAB — CBG MONITORING, ED
Glucose-Capillary: 241 mg/dL — ABNORMAL HIGH (ref 70–99)
Glucose-Capillary: 217 mg/dL — ABNORMAL HIGH (ref 70–99)
Glucose-Capillary: 208 mg/dL — ABNORMAL HIGH (ref 70–99)

## 2020-08-16 LAB — POC SARS CORONAVIRUS 2 AG -  ED: SARS Coronavirus 2 Ag: NEGATIVE

## 2020-08-16 NOTE — BH Assessment (Signed)
[place] Assessment Progress Note  Per Shuvon Rankin, NP, this voluntary pt requires psychiatric hospitalization at this time.  Dr Domingo Cocking at [facility] has tentatively agreed to take pt pending new negative Covid-19 test results.  EDP Octaviano Glow, MD agrees to order.  Please contact Ashley (intake number: [phone]) once results have come back to finalize plan.  Pt will either need to sign Voluntary Consent for Admission and Treatment before she can be transports (via [facility]) or she will need to be placed under IVC if she meets criteria (and transported via [facility]).  Dr Langston Masker and pt's nurse, Eustaquio Maize, have been notified, as has Jola Babinski, Youth worker.  [name], [place] Coordinator [phone]

## 2020-08-16 NOTE — ED Provider Notes (Signed)
Emergency Medicine Observation Re-evaluation Note  [name] is a 45 y.o. female, seen on rounds today.  Pt initially presented to the ED for complaints of Psychiatric Evaluation Currently, the patient is resting.  Physical Exam  BP 131/82 (BP Location: Left Arm)   Pulse (!) 105   Temp 98.5 F (36.9 C) (Oral)   Resp 16   SpO2 100%  Physical Exam General: in bed, answers to name Skin: dry Lungs: no respiratory distress, unlabored breathing Psych: currently calm and resting  ED Course / MDM  EKG:EKG Interpretation  Date/Time:  Friday August 13 2020 15:31:32 EST Ventricular Rate:  98 PR Interval:  148 QRS Duration: 86 QT Interval:  356 QTC Calculation: 124 R Axis:   -7 Text Interpretation: Normal sinus rhythm Normal ECG No acute changes No significant change since last tracing Confirmed by Varney Biles [phone]) on 08/13/2020 5:05:54 PM    I have reviewed the labs performed to date as well as medications administered while in observation.  Recent changes in the last 24 hours include, still awaiting placement.  Plan  Current plan is for inpt psychiatric care. Patient is not under full IVC at this time.   Lorelle Gibbs, [place] 08/16/20 [phone]

## 2020-08-16 NOTE — ED Notes (Signed)
Pt alert . Pt restless. Paranoid. Suspicious. Guarded.  Scared.disorganized.  Medication compliant this AM with encouragement. Slow to respond at times.

## 2020-08-16 NOTE — ED Notes (Signed)
Breakfast tray given. °

## 2020-08-16 NOTE — BH Assessment (Signed)
Patient can arrive at 9pm   Patient has been accepted to [facility].  Accepting physician is Dr. Domingo Cocking.  Attending Physician will be Dr. Domingo Cocking.  Patient has been assigned to room 320, by [place].   Call report to [phone].  Representative/Transfer Coordinator is Ilda Foil Patient pre-admitted by [facility] Patient Access Elberta Fortis)  [facility] ER Staff Jola Babinski, [place]) made aware of acceptance.

## 2020-08-17 DIAGNOSIS — F250 Schizoaffective disorder, bipolar type: Principal | ICD-10-CM

## 2020-08-17 LAB — GLUCOSE, CAPILLARY
Glucose-Capillary: 283 mg/dL — ABNORMAL HIGH (ref 70–99)
Glucose-Capillary: 213 mg/dL — ABNORMAL HIGH (ref 70–99)
Glucose-Capillary: 231 mg/dL — ABNORMAL HIGH (ref 70–99)
Glucose-Capillary: 164 mg/dL — ABNORMAL HIGH (ref 70–99)

## 2020-08-17 MED ORDER — ALUM & MAG HYDROXIDE-SIMETH 200-200-20 MG/5ML PO SUSP: 30.0000 mL | ORAL | Status: DC | PRN

## 2020-08-17 MED ORDER — METFORMIN HCL 500 MG PO TABS
1000.0000 mg | ORAL_TABLET | Freq: Two times a day (BID) | ORAL | Status: DC
  Administered 2020-08-17 – 2020-08-20 (×7): 1000 mg via ORAL
  Filled 2020-08-17 (×7): qty 2

## 2020-08-17 MED ORDER — ACETAMINOPHEN 325 MG PO TABS: 650.0000 mg | ORAL_TABLET | ORAL | Status: DC | PRN

## 2020-08-17 MED ORDER — ATORVASTATIN CALCIUM 20 MG PO TABS
80.0000 mg | ORAL_TABLET | Freq: Every day | ORAL | Status: DC
  Administered 2020-08-17 – 2020-08-20 (×4): 80 mg via ORAL
  Filled 2020-08-17 (×4): qty 4

## 2020-08-17 MED ORDER — ARIPIPRAZOLE 10 MG PO TABS
10.0000 mg | ORAL_TABLET | Freq: Every day | ORAL | Status: DC
  Administered 2020-08-17 – 2020-08-20 (×4): 10 mg via ORAL
  Filled 2020-08-17 (×4): qty 1

## 2020-08-17 MED ORDER — INSULIN ASPART 100 UNIT/ML ~~LOC~~ SOLN
0.0000 [IU] | Freq: Three times a day (TID) | SUBCUTANEOUS | Status: DC
  Administered 2020-08-17: 8 [IU] via SUBCUTANEOUS
  Administered 2020-08-17 (×2): 5 [IU] via SUBCUTANEOUS
  Administered 2020-08-18: 3 [IU] via SUBCUTANEOUS
  Administered 2020-08-18: 5 [IU] via SUBCUTANEOUS
  Administered 2020-08-18 – 2020-08-20 (×6): 3 [IU] via SUBCUTANEOUS
  Filled 2020-08-17 (×10): qty 1

## 2020-08-17 MED ORDER — INSULIN ASPART 100 UNIT/ML ~~LOC~~ SOLN
0.0000 [IU] | Freq: Every day | SUBCUTANEOUS | Status: DC
  Administered 2020-08-19: 2 [IU] via SUBCUTANEOUS
  Filled 2020-08-17: qty 1

## 2020-08-17 MED ORDER — DIVALPROEX SODIUM 250 MG PO DR TAB
250.0000 mg | DELAYED_RELEASE_TABLET | Freq: Two times a day (BID) | ORAL | Status: DC
  Administered 2020-08-17 – 2020-08-20 (×7): 250 mg via ORAL
  Filled 2020-08-17 (×8): qty 1

## 2020-08-17 MED ORDER — TRAZODONE HCL 50 MG PO TABS
50.0000 mg | ORAL_TABLET | Freq: Every evening | ORAL | Status: DC | PRN
  Administered 2020-08-17 – 2020-08-19 (×3): 50 mg via ORAL
  Filled 2020-08-17 (×3): qty 1

## 2020-08-17 MED ORDER — ONDANSETRON HCL 4 MG PO TABS: 4.0000 mg | ORAL_TABLET | Freq: Three times a day (TID) | ORAL | Status: DC | PRN

## 2020-08-17 MED ORDER — ACETAMINOPHEN 325 MG PO TABS: 650.0000 mg | ORAL_TABLET | Freq: Four times a day (QID) | ORAL | Status: DC | PRN

## 2020-08-17 MED ORDER — DAPAGLIFLOZIN PROPANEDIOL 5 MG PO TABS
5.0000 mg | ORAL_TABLET | Freq: Every day | ORAL | Status: DC
  Administered 2020-08-17 – 2020-08-20 (×4): 5 mg via ORAL
  Filled 2020-08-17 (×4): qty 1

## 2020-08-17 MED ORDER — LISINOPRIL 5 MG PO TABS
5.0000 mg | ORAL_TABLET | Freq: Every day | ORAL | Status: DC
  Administered 2020-08-17 – 2020-08-20 (×4): 5 mg via ORAL
  Filled 2020-08-17 (×4): qty 1

## 2020-08-17 MED ORDER — MAGNESIUM HYDROXIDE 400 MG/5ML PO SUSP: 30.0000 mL | Freq: Every day | ORAL | Status: DC | PRN

## 2020-08-17 NOTE — BHH Suicide Risk Assessment (Signed)
[facility] INPATIENT:  Family/Significant Other Suicide Prevention Education  Suicide Prevention Education:  Contact Attempts: Earthelene [name]/mother [phone]), has been identified by the patient as the family member/significant other with whom the patient will be residing, and identified as the person(s) who will aid the patient in the event of a mental health crisis.  With written consent from the patient, two attempts were made to provide suicide prevention education, prior to and/or following the patient's discharge.  We were unsuccessful in providing suicide prevention education.  A suicide education pamphlet was given to the patient to share with family/significant other.  Date and time of first attempt: 08/17/20 at 3:59pm Date and time of second attempt: Second attempt will need to be made  Shirl Harris 08/17/2020, 4:03 PM
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
## 2020-08-17 NOTE — BHH Group Notes (Signed)
LCSW Group Therapy Note  08/17/2020 2:16 PM  Type of Therapy/Topic:  Group Therapy:  Feelings about Diagnosis  Participation Level:  None   Description of Group:   This group will allow patients to explore their thoughts and feelings about diagnoses they have received. Patients will be guided to explore their level of understanding and acceptance of these diagnoses. Facilitator will encourage patients to process their thoughts and feelings about the reactions of others to their diagnosis and will guide patients in identifying ways to discuss their diagnosis with significant others in their lives. This group will be process-oriented, with patients participating in exploration of their own experiences, giving and receiving support, and processing challenge from other group members.   Therapeutic Goals: 1. Patient will demonstrate understanding of diagnosis as evidenced by identifying two or more symptoms of the disorder 2. Patient will be able to express two feelings regarding the diagnosis 3. Patient will demonstrate their ability to communicate their needs through discussion and/or role play  Summary of Patient Progress: Patient came in at the end of group. She shared that a peer helped motivate her earlier in the day.   Therapeutic Modalities:   Cognitive Behavioral Therapy Brief Therapy Feelings Identification   Brett Darko R. Guerry Bruin, MSW, LCSW, [place] 08/17/2020 2:16 PM

## 2020-08-17 NOTE — BHH Suicide Risk Assessment (Signed)
[facility] Admission Suicide Risk Assessment   Nursing information obtained from:  Patient Demographic factors:  NA Current Mental Status:  NA Loss Factors:  NA Historical Factors:  NA Risk Reduction Factors:  Responsible for children under 43 years of age,Sense of responsibility to family,Employed,Living with another person, especially a relative,Positive social support  Total Time spent with patient: 1 hour Principal Problem: Schizoaffective disorder, bipolar type ([place]) Diagnosis:  Principal Problem:   Schizoaffective disorder, bipolar type ([place]) Active Problems:   Cannabis use disorder, moderate, dependence ([place])   Diabetes mellitus ([place])   Essential hypertension  Subjective Data: [name] a 45 y.o.femalepatient admitted after being found wandering and confused near her apartment. No acute events overnight, medication compliant, attending to ADLs.  Seen one-on-one at bedside today. She is very guarded and suspicious on arrival, but does open up slightly to inform us of her concerns. She does not recall what happened prior to arriving to outside hospital. She does recall being transferred to our hospital. She was very concerned due to the path they took her out of outside hospital, and into our hospital, and stated the car stopped during transportation. She is worried that people were trying to mistreat her. 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 She mentions she was off medications for about 3 months, but due to pharmacy not filling her medications. She says she is following up with an outside provider, and knows she needs these medications. She also vaguely eludes to having an interment presence and was worried patients would recognize her and feel intimidated. During our conversation she exhibits thought blocking and slowed  speech, and appears to be responding to internal stimuli.   Continued Clinical Symptoms:  Alcohol Use Disorder Identification Test Final Score (AUDIT): 1 The "Alcohol Use Disorders Identification Test", Guidelines for Use in Primary Care, Second Edition.  World Pharmacologist [facility]). Score between 0-7:  no or low risk or alcohol related problems. Score between 8-15:  moderate risk of alcohol related problems. Score between 16-19:  high risk of alcohol related problems. Score 20 or above:  warrants further diagnostic evaluation for alcohol dependence and treatment.   CLINICAL FACTORS:   Severe Anxiety and/or Agitation Schizophrenia:   Paranoid or undifferentiated type Currently Psychotic Previous Psychiatric Diagnoses and Treatments Medical Diagnoses and Treatments/Surgeries   Musculoskeletal: Strength & Muscle Tone: within normal limits Gait & Station: normal Patient leans: N/A  Psychiatric Specialty Exam: Physical Exam Vitals and nursing note reviewed.  Constitutional:      Appearance: Normal appearance.  HENT:     Head: Normocephalic and atraumatic.     Right Ear: External ear normal.     Left Ear: External ear normal.     Nose: Nose normal.     Mouth/Throat:     Mouth: Mucous membranes are moist.     Pharynx: Oropharynx is clear.  Eyes:     Extraocular Movements: Extraocular movements intact.     Conjunctiva/sclera: Conjunctivae normal.     Pupils: Pupils are equal, round, and reactive to light.  Cardiovascular:     Rate and Rhythm: Normal rate.     Pulses: Normal pulses.  Pulmonary:     Effort: Pulmonary effort is normal.     Breath sounds: Normal breath sounds.  Abdominal:     General: Abdomen is flat.  Palpations: Abdomen is soft.  Musculoskeletal:        General: No swelling. Normal range of motion.     Cervical back: Normal range of motion and neck supple.  Skin:    General: Skin is warm and dry.  Neurological:     General: No focal deficit  present.     Mental Status: She is alert and oriented to person, place, and time.  Psychiatric:        Attention and Perception: She is inattentive.        Mood and Affect: Mood is anxious.        Speech: Speech is delayed.        Behavior: Behavior is withdrawn.        Thought Content: Thought content is paranoid and delusional.        Cognition and Memory: Cognition is impaired. Memory is impaired.        Judgment: Judgment is inappropriate.     Review of Systems  Constitutional: Positive for appetite change and fatigue.  HENT: Negative for rhinorrhea and sore throat.   Eyes: Negative for photophobia and visual disturbance.  Respiratory: Negative for cough and shortness of breath.   Cardiovascular: Negative for chest pain and palpitations.  Gastrointestinal: Negative for constipation, diarrhea, nausea and vomiting.  Endocrine: Negative for cold intolerance and heat intolerance.  Genitourinary: Negative for difficulty urinating and dysuria.  Musculoskeletal: Negative for arthralgias and myalgias.  Skin: Negative for rash and wound.  Allergic/Immunologic: Negative for environmental allergies and food allergies.  Neurological: Negative for dizziness and headaches.  Hematological: Negative for adenopathy. Does not bruise/bleed easily.  Psychiatric/Behavioral: Positive for confusion, decreased concentration, hallucinations and sleep disturbance. Negative for suicidal ideas. The patient is nervous/anxious.     Blood pressure 130/87, pulse (!) 110, temperature 98.3 F (36.8 C), temperature source Oral, resp. rate 18, height 5\' 11"  (1.803 m), weight 104.3 kg, SpO2 100 %.Body mass index is 32.08 kg/m.  General Appearance: Casual  Eye Contact:  Intermittent, glance to the ceiling and wall   Speech:  Blocked and Slow  Volume:  Normal  Mood:  Anxious  Affect:  Congruent and Constricted  Thought Process:  Disorganized  Orientation:  Full (Time, Place, and Person)  Thought Content:   Illogical, Delusions, Hallucinations: Auditory and Paranoid Ideation  Suicidal Thoughts:  No  Homicidal Thoughts:  No  Memory:  Immediate;   Fair Recent;   Fair Remote;   Poor  Judgement:  Impaired  Insight:  Shallow  Psychomotor Activity:  Decreased  Concentration:  Concentration: Fair  Recall:  Poor  Fund of Knowledge:  Fair  Language:  Fair  Akathisia:  Negative  Handed:  Right  AIMS (if indicated):     Assets:  Desire for Improvement Financial Resources/Insurance Housing Intimacy Social Support  ADL's:  Intact  Cognition:  Impaired,  Mild  Sleep:  Number of Hours: 5.5         COGNITIVE FEATURES THAT CONTRIBUTE TO RISK:  Closed-mindedness    SUICIDE RISK:   Mild:  Suicidal ideation of limited frequency, intensity, duration, and specificity.  There are no identifiable plans, no associated intent, mild dysphoria and related symptoms, good self-control (both objective and subjective assessment), few other risk factors, and identifiable protective factors, including available and accessible social support.  PLAN OF CARE: Continue inpatient admission. See H&P for full assessment and plan.   I certify that inpatient services furnished can reasonably be expected to improve the patient's condition.   Jinny Blossom  Denna Haggard, MD 08/17/2020, 3:41 PM

## 2020-08-17 NOTE — Progress Notes (Signed)
Pt is very guarded and paranoid. She does not endorse or deny SI, HI, and AVH. Patient will stare blankly instead of answering assessment questions. Patient will accept CBG and will take her medications with coaxing/encouragement. Pt appears to be knowledgeable about her medications and states, "I take them everyday." Patient will come out for meals and group but remains guarded.    Medications were given per MD orders. Support and encouragement was provided. Patient remains safe on the unit at this time. Q15 minute safety checks are maintained.

## 2020-08-17 NOTE — Progress Notes (Signed)
ADMISSION NOTE:   45yr old black female admitted to the unit voluntarily as transfer from [facility]. Patient is in no acute distress. She is alert and oriented, but presents as paranoid and guarded during admission.  Patient refused to sign admission documents stating "Ive already signed the 72 hr paper and that's all Im going to sign ... because that's all I need." Patient refused to engage with staff until we took down our mask so that she could see each of our faces, then told security officer that she did not trust any of the staff here at this hospital. Took some coaxing with food and promise to be able to call her mother, to get her to allow for vitals and to remove shoes that were not appropriate to be worn on the unit. Patient refused skin assessment and became agitated when asked to allow for assessment. She was acclimated to the unit and again appeared paranoid as she inspected the bathroom in her room and the milieu which appeared apprehensive to enter while being shown the unit. She denied SI HI  AVH depression and anxiety when asked. For now she is safe on the unit with 15 minute safety checks and placed in a room across from the nurses station.   Cleo Butler-Nicholson, LPN

## 2020-08-17 NOTE — Plan of Care (Signed)
NEW ADMISSION  Problem: Education: Goal: Ability to make informed decisions regarding treatment will improve Outcome: Not Progressing   Problem: Coping: Goal: Coping ability will improve Outcome: Not Progressing   Problem: Health Behavior/Discharge Planning: Goal: Identification of resources available to assist in meeting health care needs will improve Outcome: Not Progressing   Problem: Medication: Goal: Compliance with prescribed medication regimen will improve Outcome: Not Progressing   Problem: Self-Concept: Goal: Ability to disclose and discuss suicidal ideas will improve Outcome: Not Progressing Goal: Will verbalize positive feelings about self Outcome: Not Progressing   Problem: Education: Goal: Knowledge of General Education information will improve Description: Including pain rating scale, medication(s)/side effects and non-pharmacologic comfort measures Outcome: Not Progressing   Problem: Health Behavior/Discharge Planning: Goal: Ability to manage health-related needs will improve Outcome: Not Progressing   Problem: Nutrition: Goal: Adequate nutrition will be maintained Outcome: Not Progressing   Problem: Coping: Goal: Level of anxiety will decrease Outcome: Not Progressing
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
## 2020-08-18 LAB — LIPID PANEL
Cholesterol: 93 mg/dL (ref 0–200)
HDL: 30 mg/dL — ABNORMAL LOW (ref >40)
LDL Cholesterol: 49 mg/dL (ref 0–99)
Total CHOL/HDL Ratio: 3.1 RATIO
Triglycerides: 68 mg/dL (ref <150)
VLDL: 14 mg/dL (ref 0–40)

## 2020-08-18 LAB — GLUCOSE, CAPILLARY
Glucose-Capillary: 239 mg/dL — ABNORMAL HIGH (ref 70–99)
Glucose-Capillary: 156 mg/dL — ABNORMAL HIGH (ref 70–99)
Glucose-Capillary: 166 mg/dL — ABNORMAL HIGH (ref 70–99)
Glucose-Capillary: 139 mg/dL — ABNORMAL HIGH (ref 70–99)

## 2020-08-18 LAB — URINALYSIS, ROUTINE W REFLEX MICROSCOPIC
Bacteria, UA: NONE SEEN
Bilirubin Urine: NEGATIVE
Glucose, UA: >=500 mg/dL — AB
Hgb urine dipstick: NEGATIVE
Ketones, ur: 20 mg/dL — AB
Nitrite: NEGATIVE
Protein, ur: NEGATIVE mg/dL
Specific Gravity, Urine: 1.036 — ABNORMAL HIGH (ref 1.005–1.030)
pH: 6 (ref 5.0–8.0)

## 2020-08-18 MED ORDER — HYDROXYZINE HCL 50 MG PO TABS
50.0000 mg | ORAL_TABLET | Freq: Three times a day (TID) | ORAL | Status: DC | PRN
  Administered 2020-08-18 (×2): 50 mg via ORAL
  Filled 2020-08-18 (×2): qty 1

## 2020-08-18 MED ORDER — LORAZEPAM 1 MG PO TABS
1.0000 mg | ORAL_TABLET | Freq: Three times a day (TID) | ORAL | Status: DC
  Administered 2020-08-18 – 2020-08-19 (×3): 1 mg via ORAL
  Filled 2020-08-18 (×3): qty 1

## 2020-08-18 NOTE — [facility] Group Notes (Signed)
LCSW Group Therapy Note  08/18/2020 2:10 PM  Type of Therapy/Topic:  Group Therapy:  Emotion Regulation  Participation Level:  Did Not Attend   Description of Group:   The purpose of this group is to assist patients in learning to regulate negative emotions and experience positive emotions. Patients will be guided to discuss ways in which they have been vulnerable to their negative emotions. These vulnerabilities will be juxtaposed with experiences of positive emotions or situations, and patients will be challenged to use positive emotions to combat negative ones. Special emphasis will be placed on coping with negative emotions in conflict situations, and patients will process healthy conflict resolution skills.  Therapeutic Goals: 1. Patient will identify two positive emotions or experiences to reflect on in order to balance out negative emotions 2. Patient will label two or more emotions that they find the most difficult to experience 3. Patient will demonstrate positive conflict resolution skills through discussion and/or role plays  Summary of Patient Progress: X  Therapeutic Modalities:   Cognitive Behavioral Therapy Feelings Identification Dialectical Behavioral Therapy  Assunta Curtis, MSW, LCSW 08/18/2020 2:10 PM

## 2020-08-18 NOTE — Progress Notes (Signed)
Recreation Therapy Notes  Date: 08/18/2020  Time: 9:30 am   Location: Craft room     Behavioral response: N/A   Intervention Topic: Goals   Discussion/Intervention: Patient did not attend group.   Clinical Observations/Feedback:  Patient did not attend group.   Giamarie Bueche LRT/CTRS        Loudon Krakow 08/18/2020 11:36 AM
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
## 2020-08-18 NOTE — Progress Notes (Signed)
   08/18/20 1430  Clinical Encounter Type  Visited With Other (Comment) (See Note)   [name] attended and participated in Spirituality Group today.

## 2020-08-18 NOTE — Progress Notes (Signed)
Recreation Therapy Notes  INPATIENT RECREATION THERAPY ASSESSMENT  Patient Details Name: [name] MRN: [id number] DOB: [birth year] Today's Date: 08/18/2020       Information Obtained From: Chart Review  Able to Participate in Assessment/Interview: No  Patient Presentation: Confused,Withdrawn  Reason for Admission (Per Patient): Active Symptoms  Patient Stressors:    Coping Skills:   Prayer,Read  Leisure Interests (2+):  Music - Singing,Sports - Dance  Frequency of Recreation/Participation:    Awareness of Community Resources:  Yes  Community Resources:  Other (Comment) ([facility])  Current Use:    If no, Barriers?:    Expressed Interest in [place] of Residence:  [place]  Patient Main Form of Transportation: Musician  Patient Strengths:  Communication  Patient Identified Areas of Improvement:  Reaching out for help  Patient Goal for Hospitalization:  Feel better and get back to work  Current SI (including self-harm):  No  Current HI:  No  Current AVH: No  Staff Intervention Plan: Group Attendance,Collaborate with Interdisciplinary Treatment Team  Consent to Intern Participation: N/A  Donya Tomaro 08/18/2020, 4:06 PM

## 2020-08-18 NOTE — Plan of Care (Signed)
  Problem: Coping: Goal: Coping ability will improve Outcome: Not Progressing   Problem: Medication: Goal: Compliance with prescribed medication regimen will improve Outcome: Progressing   Problem: Self-Concept: Goal: Ability to disclose and discuss suicidal ideas will improve Outcome: Not Progressing Goal: Will verbalize positive feelings about self Outcome: Not Progressing   Problem: Education: Goal: Knowledge of General Education information will improve Description: Including pain rating scale, medication(s)/side effects and non-pharmacologic comfort measures Outcome: Not Progressing   Problem: Health Behavior/Discharge Planning: Goal: Ability to manage health-related needs will improve Outcome: Not Progressing   Problem: Nutrition: Goal: Adequate nutrition will be maintained Outcome: Progressing   Problem: Coping: Goal: Level of anxiety will decrease Outcome: Progressing

## 2020-08-18 NOTE — Progress Notes (Signed)
Recreation Therapy Notes  INPATIENT RECREATION TR PLAN  Patient Details Name: [name] MRN: [id number] DOB: 1976/05/08 Today's Date: 08/18/2020  Rec Therapy Plan Is patient appropriate for Therapeutic Recreation?: Yes Treatment times per week: at least 3 Estimated Length of Stay: 5-7 days TR Treatment/Interventions: Group participation (Comment)  Discharge Criteria    Discharge Summary     Shatasha Lambing 08/18/2020, 4:07 PM

## 2020-08-18 NOTE — Plan of Care (Signed)
Pt rates anxiety 5/10. Pt denies depression, SI, HI and AVH. Pt was educated on care plan and verbalizes understanding. Collier Bullock RN Problem: Education: Goal: Ability to make informed decisions regarding treatment will improve Outcome: Progressing   Problem: Education: Goal: Ability to make informed decisions regarding treatment will improve Outcome: Progressing   Problem: Coping: Goal: Coping ability will improve Outcome: Progressing   Problem: Health Behavior/Discharge Planning: Goal: Identification of resources available to assist in meeting health care needs will improve Outcome: Progressing   Problem: Medication: Goal: Compliance with prescribed medication regimen will improve Outcome: Progressing   Problem: Self-Concept: Goal: Ability to disclose and discuss suicidal ideas will improve Outcome: Progressing Goal: Will verbalize positive feelings about self Outcome: Progressing   Problem: Education: Goal: Knowledge of General Education information will improve Description: Including pain rating scale, medication(s)/side effects and non-pharmacologic comfort measures Outcome: Progressing   Problem: Health Behavior/Discharge Planning: Goal: Ability to manage health-related needs will improve Outcome: Progressing   Problem: Nutrition: Goal: Adequate nutrition will be maintained Outcome: Progressing   Problem: Coping: Goal: Level of anxiety will decrease Outcome: Not Progressing

## 2020-08-18 NOTE — Progress Notes (Signed)
[facility] MD Progress Note  08/18/2020 1:21 PM [name]  MRN:  [id number]   Subjective: Daily follow-up for  [name], a 45 y.o.female with schizoaffective disorder, bipolar type.Patient admitted after being found wandering and confused near her apartment. No acute events overnight, but she was found to be harassing other patients for using the phone. However, when they stopped using the phone she would just stare at it. She has been medication compliant.   Patient seen during treatment team and again one-on-one. She is very confused today. She is only aware of her name, and also recognizes provider. She is unaware of where she is or why. She is told she is in the hospital for mental health numerous times, but continues to ask. She states her goal is to get better, back to work, and to reconnect with her family. Staff was able to connect her to mother via phone this morning. Patient observed with hypoactivity, starting, rigidity, and withdrawal symptoms of catatonia. 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 Will trial Ativan 1 mg TID to improve symptoms.   Principal Problem: Schizoaffective disorder, bipolar type ([place]) Diagnosis: Principal Problem:   Schizoaffective disorder, bipolar type ([place]) Active Problems:   Cannabis use disorder, moderate, dependence ([place])   Diabetes mellitus ([place])   Essential hypertension  Total Time spent with patient: 30 minutes  Past Psychiatric History: See H&P  Past Medical History:  Past Medical History:  Diagnosis Date  . Abnormal Pap smear   . Bipolar 1 disorder ([place])   . Diabetes mellitus without complication ([place])   . Fibroids   . Hypertension   . IBS (irritable bowel syndrome)     Past Surgical History:  Procedure Laterality Date  . CERVICAL BIOPSY    . CHOLECYSTECTOMY    . TUBAL LIGATION     Family History:  Family History  Problem Relation Age of Onset  . Diabetes Father   . Diabetes Paternal Grandmother   . Diabetes Maternal  Grandmother   . Mental illness Cousin   . Healthy Mother    Family Psychiatric  History: See H&P Social History:  Social History   Substance and Sexual Activity  Alcohol Use Yes   Comment: occasional     Social History   Substance and Sexual Activity  Drug Use Yes  . Types: Marijuana    Social History   Socioeconomic History  . Marital status: Single    Spouse name: Not on file  . Number of children: 2  . Years of education: Not on file  . Highest education level: Master's degree (e.g., MA, MS, MEng, MEd, MSW, MBA)  Occupational History  . Not on file  Tobacco Use  . Smoking status: Current Every Day Smoker    Packs/day: 1.00    Years: 3.00    Pack years: 3.00    Types: Cigarettes, E-cigarettes  . Smokeless tobacco: Never Used  Vaping Use  . Vaping Use: Never used  Substance and Sexual Activity  . Alcohol use: Yes    Comment: occasional  . Drug use: Yes    Types: Marijuana  . Sexual activity: Not Currently    Birth control/protection: Surgical  Other Topics Concern  . Not on file  Social History Narrative  . Not on file   Social Determinants of Health   Financial Resource Strain: Not on file  Food Insecurity: Not on file  Transportation Needs: Not on file  Physical Activity: Not on file  Stress: Not on file  Social Connections: Not on file   Additional Social History:                         Sleep: Poor  Appetite:  Poor  Current Medications: Current Facility-Administered Medications  Medication Dose Route Frequency Provider Last Rate Last Admin  . acetaminophen (TYLENOL) tablet 650 mg  650 mg Oral Q4H PRN Salley Scarlet, MD      . alum & mag hydroxide-simeth (MAALOX/MYLANTA) 200-200-20 MG/5ML suspension 30 mL  30 mL Oral Q4H PRN Salley Scarlet, MD      . ARIPiprazole (ABILIFY) tablet 10 mg  10 mg Oral Daily Salley Scarlet, MD   10 mg at 08/18/20 5366  . atorvastatin (LIPITOR) tablet 80 mg  80 mg Oral Daily Salley Scarlet, MD    80 mg at 08/18/20 4403  . dapagliflozin propanediol (FARXIGA) tablet 5 mg  5 mg Oral QAC breakfast Salley Scarlet, MD   5 mg at 08/18/20 0732  . divalproex (DEPAKOTE) DR tablet 250 mg  250 mg Oral BID Salley Scarlet, MD   250 mg at 08/18/20 4742  . hydrOXYzine (ATARAX/VISTARIL) tablet 50 mg  50 mg Oral TID PRN Salley Scarlet, MD   50 mg at 08/18/20 1139  . insulin aspart (novoLOG) injection 0-15 Units  0-15 Units Subcutaneous TID WC Salley Scarlet, MD   3 Units at 08/18/20 1130  . insulin aspart (novoLOG) injection 0-5 Units  0-5 Units Subcutaneous QHS Selina Cooley M, MD      . lisinopril (ZESTRIL) tablet 5 mg  5 mg Oral Daily Salley Scarlet, MD   5 mg at 08/18/20 5956  . LORazepam (ATIVAN) tablet 1 mg  1 mg Oral TID Salley Scarlet, MD      . magnesium hydroxide (MILK OF MAGNESIA) suspension 30 mL  30 mL Oral Daily PRN Salley Scarlet, MD      . metFORMIN (GLUCOPHAGE) tablet 1,000 mg  1,000 mg Oral BID WC Salley Scarlet, MD   1,000 mg at 08/18/20 3875  . ondansetron (ZOFRAN) tablet 4 mg  4 mg Oral Q8H PRN Salley Scarlet, MD      . traZODone (DESYREL) tablet 50 mg  50 mg Oral QHS PRN Salley Scarlet, MD   50 mg at 08/17/20 2036    Lab Results:  Results for orders placed or performed during the hospital encounter of 08/16/20 (from the past 48 hour(s))  Glucose, capillary     Status: Abnormal   Collection Time: 08/17/20  7:54 AM  Result Value Ref Range   Glucose-Capillary 283 (H) 70 - 99 mg/dL    Comment: Glucose reference range applies only to samples taken after fasting for at least 8 hours.  Glucose, capillary     Status: Abnormal   Collection Time: 08/17/20 11:24 AM  Result Value Ref Range   Glucose-Capillary 213 (H) 70 - 99 mg/dL    Comment: Glucose reference range applies only to samples taken after fasting for at least 8 hours.  Glucose, capillary     Status: Abnormal   Collection Time: 08/17/20  4:12 PM  Result Value Ref Range   Glucose-Capillary 231 (H) 70 - 99  mg/dL    Comment: Glucose reference range applies only to samples taken after fasting for at least 8 hours.  Glucose, capillary     Status: Abnormal   Collection Time: 08/17/20  8:02 PM  Result Value Ref Range  Glucose-Capillary 164 (H) 70 - 99 mg/dL    Comment: Glucose reference range applies only to samples taken after fasting for at least 8 hours.  Lipid panel     Status: Abnormal   Collection Time: 08/18/20  6:53 AM  Result Value Ref Range   Cholesterol 93 0 - 200 mg/dL   Triglycerides 68 <150 mg/dL   HDL 30 (L) >40 mg/dL   Total CHOL/HDL Ratio 3.1 RATIO   VLDL 14 0 - 40 mg/dL   LDL Cholesterol 49 0 - 99 mg/dL    Comment:        Total Cholesterol/HDL:CHD Risk Coronary Heart Disease Risk Table                     Men   Women  1/2 Average Risk   3.4   3.3  Average Risk       5.0   4.4  2 X Average Risk   9.6   7.1  3 X Average Risk  23.4   11.0        Use the calculated Patient Ratio above and the CHD Risk Table to determine the patient's CHD Risk.        ATP III CLASSIFICATION (LDL):  <100     mg/dL   Optimal  100-129  mg/dL   Near or Above                    Optimal  130-159  mg/dL   Borderline  160-189  mg/dL   High  >190     mg/dL   Very High Performed at [facility], [address] [place], [place] [address]   Glucose, capillary     Status: Abnormal   Collection Time: 08/18/20  6:58 AM  Result Value Ref Range   Glucose-Capillary 239 (H) 70 - 99 mg/dL    Comment: Glucose reference range applies only to samples taken after fasting for at least 8 hours.   Comment 1 Notify RN   Glucose, capillary     Status: Abnormal   Collection Time: 08/18/20 11:19 AM  Result Value Ref Range   Glucose-Capillary 156 (H) 70 - 99 mg/dL    Comment: Glucose reference range applies only to samples taken after fasting for at least 8 hours.    Blood Alcohol level:  Lab Results  Component Value Date   ETH <10 08/13/2020   ETH <10 67/59/1638    Metabolic Disorder  Labs: Lab Results  Component Value Date   HGBA1C 10.5 (A) 06/23/2020   MPG 168.55 02/27/2017   MPG 171 08/06/2016   Lab Results  Component Value Date   PROLACTIN 11.7 05/17/2018   PROLACTIN 105.5 (H) 02/28/2017   Lab Results  Component Value Date   CHOL 93 08/18/2020   TRIG 68 08/18/2020   HDL 30 (L) 08/18/2020   CHOLHDL 3.1 08/18/2020   VLDL 14 08/18/2020   LDLCALC 49 08/18/2020   LDLCALC 191 (H) 02/25/2020    Physical Findings: AIMS:  , ,  ,  ,    CIWA:    COWS:     Musculoskeletal: Strength & Muscle Tone: within normal limits Gait & Station: normal Patient leans: N/A  Psychiatric Specialty Exam: Physical Exam Vitals and nursing note reviewed.  Constitutional:      General: She is in acute distress.  HENT:     Head: Normocephalic and atraumatic.     Right Ear: External ear normal.  Left Ear: External ear normal.     Mouth/Throat:     Mouth: Mucous membranes are moist.     Pharynx: Oropharynx is clear.  Eyes:     Extraocular Movements: Extraocular movements intact.     Conjunctiva/sclera: Conjunctivae normal.     Pupils: Pupils are equal, round, and reactive to light.  Cardiovascular:     Rate and Rhythm: Normal rate.     Pulses: Normal pulses.  Pulmonary:     Effort: Pulmonary effort is normal.     Breath sounds: Normal breath sounds.  Abdominal:     General: Abdomen is flat.     Palpations: Abdomen is soft.  Musculoskeletal:     Cervical back: Normal range of motion and neck supple.     Comments: Cogwheel rigidity in her bilateral upper arms, decreased grip strength bilaterally  Skin:    General: Skin is warm and dry.  Neurological:     Mental Status: She is alert. She is disoriented.     Motor: Weakness present.     Gait: Gait abnormal.  Psychiatric:        Attention and Perception: She is inattentive.        Mood and Affect: Mood is anxious. Affect is tearful.        Speech: Speech is delayed.        Behavior: Behavior is withdrawn.         Thought Content: Thought content is paranoid and delusional.        Cognition and Memory: Cognition is impaired. Memory is impaired.        Judgment: Judgment is inappropriate.     Review of Systems  Blood pressure (!) 132/93, pulse (!) 108, temperature 99.2 F (37.3 C), temperature source Oral, resp. rate 18, height 5\' 11"  (1.803 m), weight 104.3 kg, SpO2 100 %.Body mass index is 32.08 kg/m.  General Appearance: Disheveled  Eye Contact:  Makes intermittent eye contact  Speech:  Blocked and Slow  Volume:  Decreased  Mood:  Anxious  Affect:  Congruent and Tearful  Thought Process:  Disorganized  Orientation:  Other:  only oriented to herself. Unaware of location, situation, or time  Thought Content:  Illogical, Paranoid Ideation and Tangential  Suicidal Thoughts:  No  Homicidal Thoughts:  No  Memory:  Immediate;   Poor Recent;   Poor Remote;   Poor  Judgement:  Impaired  Insight:  Shallow  Psychomotor Activity:  Decreased and Shuffling Gait  Concentration:  Concentration: Poor  Recall:  Poor  Fund of Knowledge:  Poor  Language:  Poor  Akathisia:  Negative  Handed:  Right  AIMS (if indicated):     Assets:  Desire for Improvement Financial Resources/Insurance Housing Intimacy Resilience  ADL's:  Impaired  Cognition:  Impaired,  Mild  Sleep:  Number of Hours: 7.75     Treatment Plan Summary: Daily contact with patient to assess and evaluate symptoms and progress in treatment and Medication management   1)Schizoaffective disorder, bipolar type- established problem, unstable - Exhibiting signs of catatonia today. Start Ativan 1 mg TID - Continue Abilify 10 mg daily - Continue Depakote 250 mg BID- check level Feb 17  2) Diabetes Mellitus, Type 2- established problem, unstable - Metformin 1000 mg BID - Correctional novolog with meals and bedtime - Farxiga 5 mg daily  - Will obtain hemoglobin a1c   3) History of HLD - Lipitor 80 mg Daily - HDL 30, LDL 49,  triglycerides 68  4) HTN- established,  stable - Continue lisinopril 5 mg daily   08/18/20: Psychiatric exam above reviewed and remains accurate. Assessment and plan above reviewed and updated.    Salley Scarlet, MD 08/18/2020, 1:21 PM

## 2020-08-18 NOTE — B[facility] Group Notes (Signed)
[place] Group Notes:  (Nursing/MHT/Case Management/Adjunct)  Date:  08/18/2020  Time:  9:55 PM  Type of Therapy:  Group Therapy  Participation Level:  Active  Participation Quality:  Appropriate  Affect:  Appropriate  Cognitive:  Alert  Insight:  Good  Engagement in Group:  Engaged and said she accomplish a lot.  Modes of Intervention:  Support  Summary of Progress/Problems:  Nehemiah Settle 08/18/2020, 9:55 PM
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
## 2020-08-18 NOTE — Tx Team (Addendum)
Interdisciplinary Treatment and Diagnostic Plan Update  08/18/2020 Time of Session: 9:00AM [name] MRN: [id number]  Principal Diagnosis: Schizoaffective disorder, bipolar type (HCC)  Secondary Diagnoses: Principal Problem:   Schizoaffective disorder, bipolar type (HCC) Active Problems:   Cannabis use disorder, moderate, dependence (HCC)   Diabetes mellitus (HCC)   Essential hypertension   Current Medications:  Current Facility-Administered Medications  Medication Dose Route Frequency Provider Last Rate Last Admin  . acetaminophen (TYLENOL) tablet 650 mg  650 mg Oral Q4H PRN Freeman, Megan M, MD      . acetaminophen (TYLENOL) tablet 650 mg  650 mg Oral Q6H PRN Freeman, Megan M, MD      . alum & mag hydroxide-simeth (MAALOX/MYLANTA) 200-200-20 MG/5ML suspension 30 mL  30 mL Oral Q4H PRN Freeman, Megan M, MD      . ARIPiprazole (ABILIFY) tablet 10 mg  10 mg Oral Daily Freeman, Megan M, MD   10 mg at 08/18/20 0733  . atorvastatin (LIPITOR) tablet 80 mg  80 mg Oral Daily Freeman, Megan M, MD   80 mg at 08/18/20 0733  . dapagliflozin propanediol (FARXIGA) tablet 5 mg  5 mg Oral QAC breakfast Freeman, Megan M, MD   5 mg at 08/18/20 0732  . divalproex (DEPAKOTE) DR tablet 250 mg  250 mg Oral BID Freeman, Megan M, MD   250 mg at 08/18/20 0733  . insulin aspart (novoLOG) injection 0-15 Units  0-15 Units Subcutaneous TID WC Freeman, Megan M, MD   5 Units at 08/18/20 0729  . insulin aspart (novoLOG) injection 0-5 Units  0-5 Units Subcutaneous QHS Freeman, Megan M, MD      . lisinopril (ZESTRIL) tablet 5 mg  5 mg Oral Daily Freeman, Megan M, MD   5 mg at 08/18/20 0733  . magnesium hydroxide (MILK OF MAGNESIA) suspension 30 mL  30 mL Oral Daily PRN Freeman, Megan M, MD      . metFORMIN (GLUCOPHAGE) tablet 1,000 mg  1,000 mg Oral BID WC Freeman, Megan M, MD   1,000 mg at 08/18/20 0733  . ondansetron (ZOFRAN) tablet 4 mg  4 mg Oral Q8H PRN Freeman, Megan M, MD      . traZODone (DESYREL) tablet 50  mg  50 mg Oral QHS PRN Freeman, Megan M, MD   50 mg at 08/17/20 2036   PTA Medications: Medications Prior to Admission  Medication Sig Dispense Refill Last Dose  . Accu-Chek Softclix Lancets lancets Use to check FSBS BID. Dx: E11.42 100 each 2   . ARIPiprazole (ABILIFY) 10 MG tablet Take 1 tablet (10 mg total) by mouth daily. 30 tablet 0   . atorvastatin (LIPITOR) 80 MG tablet Take 1 tablet (80 mg total) by mouth daily. 90 tablet 3   . Blood Glucose Monitoring Suppl (ACCU-CHEK GUIDE ME) w/Device KIT Use to check FSBS BID. Dx: E11.42 1 kit 0   . dapagliflozin propanediol (FARXIGA) 5 MG TABS tablet Take 1 tablet (5 mg total) by mouth daily before breakfast. 30 tablet 0   . divalproex (DEPAKOTE) 250 MG DR tablet Take 250 mg by mouth 2 (two) times daily.     . gabapentin (NEURONTIN) 100 MG capsule Take 1 capsule (100 mg total) by mouth 3 (three) times daily. 90 capsule 3   . glucose blood (ACCU-CHEK GUIDE) test strip Use to check FSBS BID. Dx: E11.42 (Patient not taking: Reported on 08/15/2020) 100 each 3   . Insulin Pen Needle (TRUEPLUS PEN NEEDLES) 32G X 4 MM MISC Use as   instructed to inject Victoza daily. 100 each 2   . liraglutide (VICTOZA) 18 MG/3ML SOPN Inject 1.8 mg into the skin daily. 6 mL 2   . lisinopril (ZESTRIL) 5 MG tablet Take 1 tablet (5 mg total) by mouth daily. 90 tablet 1   . metFORMIN (GLUCOPHAGE) 1000 MG tablet Take 1 tablet (1,000 mg total) by mouth 2 (two) times daily with a meal. 180 tablet 0   . ondansetron (ZOFRAN ODT) 4 MG disintegrating tablet Take 1 tablet (4 mg total) by mouth every 8 (eight) hours as needed for nausea or vomiting. (Patient not taking: Reported on 08/15/2020) 20 tablet 0     Patient Stressors:    Patient Strengths:    Treatment Modalities: Medication Management, Group therapy, Case management,  1 to 1 session with clinician, Psychoeducation, Recreational therapy.   Physician Treatment Plan for Primary Diagnosis: Schizoaffective disorder, bipolar  type ([place]) Long Term Goal(s): Improvement in symptoms so as ready for discharge Improvement in symptoms so as ready for discharge   Short Term Goals: Ability to identify changes in lifestyle to reduce recurrence of condition will improve Ability to verbalize feelings will improve Ability to disclose and discuss suicidal ideas Ability to demonstrate self-control will improve Ability to identify and develop effective coping behaviors will improve Compliance with prescribed medications will improve Ability to identify triggers associated with substance abuse/mental health issues will improve Ability to identify changes in lifestyle to reduce recurrence of condition will improve Ability to verbalize feelings will improve Ability to disclose and discuss suicidal ideas Ability to demonstrate self-control will improve Ability to identify and develop effective coping behaviors will improve Compliance with prescribed medications will improve Ability to identify triggers associated with substance abuse/mental health issues will improve  Medication Management: Evaluate patient's response, side effects, and tolerance of medication regimen.  Therapeutic Interventions: 1 to 1 sessions, Unit Group sessions and Medication administration.  Evaluation of Outcomes: Not Progressing  Physician Treatment Plan for Secondary Diagnosis: Principal Problem:   Schizoaffective disorder, bipolar type ([place]) Active Problems:   Cannabis use disorder, moderate, dependence ([place])   Diabetes mellitus ([place])   Essential hypertension  Long Term Goal(s): Improvement in symptoms so as ready for discharge Improvement in symptoms so as ready for discharge   Short Term Goals: Ability to identify changes in lifestyle to reduce recurrence of condition will improve Ability to verbalize feelings will improve Ability to disclose and discuss suicidal ideas Ability to demonstrate self-control will improve Ability to identify and  develop effective coping behaviors will improve Compliance with prescribed medications will improve Ability to identify triggers associated with substance abuse/mental health issues will improve Ability to identify changes in lifestyle to reduce recurrence of condition will improve Ability to verbalize feelings will improve Ability to disclose and discuss suicidal ideas Ability to demonstrate self-control will improve Ability to identify and develop effective coping behaviors will improve Compliance with prescribed medications will improve Ability to identify triggers associated with substance abuse/mental health issues will improve     Medication Management: Evaluate patient's response, side effects, and tolerance of medication regimen.  Therapeutic Interventions: 1 to 1 sessions, Unit Group sessions and Medication administration.  Evaluation of Outcomes: Not Progressing   RN Treatment Plan for Primary Diagnosis: Schizoaffective disorder, bipolar type ([place]) Long Term Goal(s): Knowledge of disease and therapeutic regimen to maintain health will improve  Short Term Goals: Ability to demonstrate self-control, Ability to participate in decision making will improve, Ability to verbalize feelings will improve, Ability to disclose and  discuss suicidal ideas, Ability to identify and develop effective coping behaviors will improve and Compliance with prescribed medications will improve  Medication Management: RN will administer medications as ordered by provider, will assess and evaluate patient's response and provide education to patient for prescribed medication. RN will report any adverse and/or side effects to prescribing provider.  Therapeutic Interventions: 1 on 1 counseling sessions, Psychoeducation, Medication administration, Evaluate responses to treatment, Monitor vital signs and CBGs as ordered, Perform/monitor CIWA, COWS, AIMS and Fall Risk screenings as ordered, Perform wound care  treatments as ordered.  Evaluation of Outcomes: Not Progressing   LCSW Treatment Plan for Primary Diagnosis: Schizoaffective disorder, bipolar type (HCC) Long Term Goal(s): Safe transition to appropriate next level of care at discharge, Engage patient in therapeutic group addressing interpersonal concerns.  Short Term Goals: Engage patient in aftercare planning with referrals and resources, Increase social support, Increase ability to appropriately verbalize feelings, Increase emotional regulation, Facilitate acceptance of mental health diagnosis and concerns and Increase skills for wellness and recovery  Therapeutic Interventions: Assess for all discharge needs, 1 to 1 time with Social worker, Explore available resources and support systems, Assess for adequacy in community support network, Educate family and significant other(s) on suicide prevention, Complete Psychosocial Assessment, Interpersonal group therapy.  Evaluation of Outcomes: Not Progressing   Progress in Treatment: Attending groups: No. Participating in groups: No. Taking medication as prescribed: Yes. Toleration medication: Yes. Family/Significant other contact made: No, will contact:  once permission is given. Patient understands diagnosis: No. Discussing patient identified problems/goals with staff: Yes. Medical problems stabilized or resolved: Yes. Denies suicidal/homicidal ideation: Yes. Issues/concerns per patient self-inventory: No. Other: none  New problem(s) identified: No, Describe:  none  New Short Term/Long Term Goal(s): elimination of symptoms of psychosis, medication management for mood stabilization; elimination of SI thoughts; development of comprehensive mental wellness plan.  Patient Goals:  "feel better and get back to work"  Discharge Plan or Barriers: Patient reports plans to return to her home.  CSW will assist patient in development of appropriate discharge plans.   Reason for Continuation of  Hospitalization: Anxiety Depression Medication stabilization Suicidal ideation  Estimated Length of Stay:  1-7 days  Recreational Therapy: Patient Stressors: N/A Patient Goal: Patient will engage in groups without prompting or encouragement from LRT x3 group sessions within 5 recreation therapy group sessions.  Attendees: Patient:[name] 08/18/2020 10:01 AM  Physician: Dr. Freeman, MD 08/18/2020 10:01 AM  Nursing: Megan Snider, RN 08/18/2020 10:01 AM  RN Care Manager: 08/18/2020 10:01 AM  Social Worker: Michaela Stanfield, MSW, LCSW 08/18/2020 10:01 AM  Recreational Therapist: Shay , CTRS, LRT  08/18/2020 10:01 AM  Other: Kiva Jordan, MSW, LCSWA 08/18/2020 10:01 AM  Other: Earl "Robbie" Forbes, LCSW 08/18/2020 10:01 AM  Other: 08/18/2020 10:01 AM    Scribe for Treatment Team: Michaela J Stanfield, LCSW 08/18/2020 10:01 AM 

## 2020-08-18 NOTE — Progress Notes (Signed)
Pt was anxious this morning and tearful. She was wide eyed and had confusion and poor judgement. She did get a hold of her mother on the phone. She received a PRN in the later morning. It was affective. She took afternoon meds that showed much improvement and cleared up her thought processes. She attended groups today and ate well. Pt seems to have improved today. Collier Bullock RN

## 2020-08-19 LAB — CREATININE, SERUM
Creatinine, Ser: 0.75 mg/dL (ref 0.44–1.00)
GFR, Estimated: >60 mL/min (ref >60)

## 2020-08-19 LAB — GLUCOSE, CAPILLARY
Glucose-Capillary: 178 mg/dL — ABNORMAL HIGH (ref 70–99)
Glucose-Capillary: 185 mg/dL — ABNORMAL HIGH (ref 70–99)
Glucose-Capillary: 178 mg/dL — ABNORMAL HIGH (ref 70–99)
Glucose-Capillary: 222 mg/dL — ABNORMAL HIGH (ref 70–99)

## 2020-08-19 LAB — HEMOGLOBIN A1C
Hgb A1c MFr Bld: 9 % — ABNORMAL HIGH (ref 4.8–5.6)
Mean Plasma Glucose: 212 mg/dL

## 2020-08-19 LAB — VALPROIC ACID LEVEL: Valproic Acid Lvl: 20 ug/mL — ABNORMAL LOW (ref 50.0–100.0)

## 2020-08-19 LAB — POTASSIUM: Potassium: 3.5 mmol/L (ref 3.5–5.1)

## 2020-08-19 MED ORDER — LORAZEPAM 1 MG PO TABS
1.0000 mg | ORAL_TABLET | Freq: Two times a day (BID) | ORAL | Status: DC
  Administered 2020-08-19 – 2020-08-20 (×2): 1 mg via ORAL
  Filled 2020-08-19 (×2): qty 1

## 2020-08-19 NOTE — Plan of Care (Signed)
Pt rates depression 1/10 and anxiety 3/10. Pt denies SI, HI and AVH. Pt was educated on care plan and verbalizes understanding. Collier Bullock RN Problem: Education: Goal: Ability to make informed decisions regarding treatment will improve Outcome: Progressing   Problem: Coping: Goal: Coping ability will improve Outcome: Progressing   Problem: Health Behavior/Discharge Planning: Goal: Identification of resources available to assist in meeting health care needs will improve Outcome: Progressing   Problem: Medication: Goal: Compliance with prescribed medication regimen will improve Outcome: Progressing   Problem: Self-Concept: Goal: Ability to disclose and discuss suicidal ideas will improve Outcome: Progressing Goal: Will verbalize positive feelings about self Outcome: Progressing   Problem: Education: Goal: Knowledge of General Education information will improve Description: Including pain rating scale, medication(s)/side effects and non-pharmacologic comfort measures Outcome: Progressing   Problem: Health Behavior/Discharge Planning: Goal: Ability to manage health-related needs will improve Outcome: Progressing   Problem: Nutrition: Goal: Adequate nutrition will be maintained Outcome: Progressing   Problem: Coping: Goal: Level of anxiety will decrease Outcome: Progressing

## 2020-08-19 NOTE — BHH Counselor (Signed)
CSW spoke with patient regarding aftercare.  Patient reports plans to continue with [facility].  She reports that she has clothing and shoes to go home in and review of belonging sheet indicates that patient does.    Patient reports that she would like [facility] to [address] [place], [place], [place] [address].  Assunta Curtis, MSW, LCSW 08/19/2020 3:52 PM

## 2020-08-19 NOTE — Progress Notes (Addendum)
[facility] MD Progress Note  08/19/2020 12:30 PM [name]  MRN:  [id number]   CC: "I feel so much better" Subjective:  [name] a 45 y.o.femalepatient admitted after being found wandering and confused near her apartment. No acute events overnight, medication compliant, attending to ADLs.   Patient seen one-on-one today, and she is markedly improved from yesterday. She is smiling brightly with clear speech and thought process. All signs of catatonia have resolved with Ativan.  She expresses gratitude for helping her out yesterday with Ativan. Explained the diagnosis of catatonia, and current treatment plan. She feels that her overall mood has improved and all hallucinations have cleared on current dose of Abilify and Depakote. She denies suicidal ideations and homicidal ideations.    Principal Problem: Schizoaffective disorder, bipolar type ([place]) Diagnosis: Principal Problem:   Schizoaffective disorder, bipolar type ([place]) Active Problems:   Cannabis use disorder, moderate, dependence ([place])   Diabetes mellitus ([place])   Essential hypertension  Total Time spent with patient: 30 minutes  Past Psychiatric History: See H&P  Past Medical History:  Past Medical History:  Diagnosis Date  . Abnormal Pap smear   . Bipolar 1 disorder ([place])   . Diabetes mellitus without complication ([place])   . Fibroids   . Hypertension   . IBS (irritable bowel syndrome)     Past Surgical History:  Procedure Laterality Date  . CERVICAL BIOPSY    . CHOLECYSTECTOMY    . TUBAL LIGATION     Family History:  Family History  Problem Relation Age of Onset  . Diabetes Father   . Diabetes Paternal Grandmother   . Diabetes Maternal Grandmother   . Mental illness Cousin   . Healthy Mother    Family Psychiatric  History: See H&P Social History:  Social History   Substance and Sexual Activity  Alcohol Use Yes   Comment: occasional     Social History   Substance and Sexual Activity  Drug Use Yes  .  Types: Marijuana    Social History   Socioeconomic History  . Marital status: Single    Spouse name: Not on file  . Number of children: 2  . Years of education: Not on file  . Highest education level: Master's degree (e.g., MA, MS, MEng, MEd, MSW, MBA)  Occupational History  . Not on file  Tobacco Use  . Smoking status: Current Every Day Smoker    Packs/day: 1.00    Years: 3.00    Pack years: 3.00    Types: Cigarettes, E-cigarettes  . Smokeless tobacco: Never Used  Vaping Use  . Vaping Use: Never used  Substance and Sexual Activity  . Alcohol use: Yes    Comment: occasional  . Drug use: Yes    Types: Marijuana  . Sexual activity: Not Currently    Birth control/protection: Surgical  Other Topics Concern  . Not on file  Social History Narrative  . Not on file   Social Determinants of Health   Financial Resource Strain: Not on file  Food Insecurity: Not on file  Transportation Needs: Not on file  Physical Activity: Not on file  Stress: Not on file  Social Connections: Not on file   Additional Social History:                         Sleep: Poor  Appetite:  Poor  Current Medications: Current Facility-Administered Medications  Medication Dose Route Frequency Provider Last Rate Last Admin  . acetaminophen (  TYLENOL) tablet 650 mg  650 mg Oral Q4H PRN Salley Scarlet, MD      . alum & mag hydroxide-simeth (MAALOX/MYLANTA) 200-200-20 MG/5ML suspension 30 mL  30 mL Oral Q4H PRN Salley Scarlet, MD      . ARIPiprazole (ABILIFY) tablet 10 mg  10 mg Oral Daily Salley Scarlet, MD   10 mg at 08/19/20 0746  . atorvastatin (LIPITOR) tablet 80 mg  80 mg Oral Daily Salley Scarlet, MD   80 mg at 08/19/20 0746  . dapagliflozin propanediol (FARXIGA) tablet 5 mg  5 mg Oral QAC breakfast Salley Scarlet, MD   5 mg at 08/19/20 0747  . divalproex (DEPAKOTE) DR tablet 250 mg  250 mg Oral BID Salley Scarlet, MD   250 mg at 08/19/20 0745  . hydrOXYzine (ATARAX/VISTARIL)  tablet 50 mg  50 mg Oral TID PRN Salley Scarlet, MD   50 mg at 08/18/20 2116  . insulin aspart (novoLOG) injection 0-15 Units  0-15 Units Subcutaneous TID WC Salley Scarlet, MD   3 Units at 08/19/20 [phone]  . insulin aspart (novoLOG) injection 0-5 Units  0-5 Units Subcutaneous QHS Selina Cooley M, MD      . lisinopril (ZESTRIL) tablet 5 mg  5 mg Oral Daily Salley Scarlet, MD   5 mg at 08/19/20 0747  . LORazepam (ATIVAN) tablet 1 mg  1 mg Oral BID Salley Scarlet, MD      . magnesium hydroxide (MILK OF MAGNESIA) suspension 30 mL  30 mL Oral Daily PRN Salley Scarlet, MD      . metFORMIN (GLUCOPHAGE) tablet 1,000 mg  1,000 mg Oral BID WC Salley Scarlet, MD   1,000 mg at 08/19/20 0743  . ondansetron (ZOFRAN) tablet 4 mg  4 mg Oral Q8H PRN Salley Scarlet, MD      . traZODone (DESYREL) tablet 50 mg  50 mg Oral QHS PRN Salley Scarlet, MD   50 mg at 08/18/20 2116    Lab Results:  Results for orders placed or performed during the hospital encounter of 08/16/20 (from the past 48 hour(s))  Glucose, capillary     Status: Abnormal   Collection Time: 08/17/20  4:12 PM  Result Value Ref Range   Glucose-Capillary 231 (H) 70 - 99 mg/dL    Comment: Glucose reference range applies only to samples taken after fasting for at least 8 hours.  Glucose, capillary     Status: Abnormal   Collection Time: 08/17/20  8:02 PM  Result Value Ref Range   Glucose-Capillary 164 (H) 70 - 99 mg/dL    Comment: Glucose reference range applies only to samples taken after fasting for at least 8 hours.  Lipid panel     Status: Abnormal   Collection Time: 08/18/20  6:53 AM  Result Value Ref Range   Cholesterol 93 0 - 200 mg/dL   Triglycerides 68 <150 mg/dL   HDL 30 (L) >40 mg/dL   Total CHOL/HDL Ratio 3.1 RATIO   VLDL 14 0 - 40 mg/dL   LDL Cholesterol 49 0 - 99 mg/dL    Comment:        Total Cholesterol/HDL:CHD Risk Coronary Heart Disease Risk Table                     Men   Women  1/2 Average Risk   3.4    3.3  Average Risk       5.0  4.4  2 X Average Risk   9.6   7.1  3 X Average Risk  23.4   11.0        Use the calculated Patient Ratio above and the CHD Risk Table to determine the patient's CHD Risk.        ATP III CLASSIFICATION (LDL):  <100     mg/dL   Optimal  100-129  mg/dL   Near or Above                    Optimal  130-159  mg/dL   Borderline  160-189  mg/dL   High  >190     mg/dL   Very High Performed at [facility], [address] [place], [place] [address]   Hemoglobin A1c     Status: Abnormal   Collection Time: 08/18/20  6:53 AM  Result Value Ref Range   Hgb A1c MFr Bld 9.0 (H) 4.8 - 5.6 %    Comment: (NOTE)         Prediabetes: 5.7 - 6.4         Diabetes: >6.4         Glycemic control for adults with diabetes: <7.0    Mean Plasma Glucose 212 mg/dL    Comment: (NOTE) Performed At: [facility] [address] [place], [place] [id number] Rush Farmer MD QA:[id number]   Glucose, capillary     Status: Abnormal   Collection Time: 08/18/20  6:58 AM  Result Value Ref Range   Glucose-Capillary 239 (H) 70 - 99 mg/dL    Comment: Glucose reference range applies only to samples taken after fasting for at least 8 hours.   Comment 1 Notify RN   Glucose, capillary     Status: Abnormal   Collection Time: 08/18/20 11:19 AM  Result Value Ref Range   Glucose-Capillary 156 (H) 70 - 99 mg/dL    Comment: Glucose reference range applies only to samples taken after fasting for at least 8 hours.  Glucose, capillary     Status: Abnormal   Collection Time: 08/18/20  4:18 PM  Result Value Ref Range   Glucose-Capillary 166 (H) 70 - 99 mg/dL    Comment: Glucose reference range applies only to samples taken after fasting for at least 8 hours.  Urinalysis, Routine w reflex microscopic Urine, Clean Catch     Status: Abnormal   Collection Time: 08/18/20  5:17 PM  Result Value Ref Range   Color, Urine YELLOW (A) YELLOW   APPearance HAZY (A) CLEAR   Specific  Gravity, Urine 1.036 (H) 1.005 - 1.030   pH 6.0 5.0 - 8.0   Glucose, UA >=500 (A) NEGATIVE mg/dL   Hgb urine dipstick NEGATIVE NEGATIVE   Bilirubin Urine NEGATIVE NEGATIVE   Ketones, ur 20 (A) NEGATIVE mg/dL   Protein, ur NEGATIVE NEGATIVE mg/dL   Nitrite NEGATIVE NEGATIVE   Leukocytes,Ua SMALL (A) NEGATIVE   RBC / HPF 6-10 0 - 5 RBC/hpf   WBC, UA 6-10 0 - 5 WBC/hpf   Bacteria, UA NONE SEEN NONE SEEN   Squamous Epithelial / LPF 11-20 0 - 5    Comment: Performed at [facility], [address] [place], [place] [address]  Glucose, capillary     Status: Abnormal   Collection Time: 08/18/20  8:22 PM  Result Value Ref Range   Glucose-Capillary 139 (H) 70 - 99 mg/dL    Comment: Glucose reference range applies only to samples taken after fasting for  at least 8 hours.   Comment 1 Notify RN   Glucose, capillary     Status: Abnormal   Collection Time: 08/19/20  7:00 AM  Result Value Ref Range   Glucose-Capillary 178 (H) 70 - 99 mg/dL    Comment: Glucose reference range applies only to samples taken after fasting for at least 8 hours.   Comment 1 Notify RN   Valproic acid level     Status: Abnormal   Collection Time: 08/19/20  8:09 AM  Result Value Ref Range   Valproic Acid Lvl 20 (L) 50.0 - 100.0 ug/mL    Comment: Performed at [facility], [address] [place], [place] [address]  Glucose, capillary     Status: Abnormal   Collection Time: 08/19/20 11:24 AM  Result Value Ref Range   Glucose-Capillary 185 (H) 70 - 99 mg/dL    Comment: Glucose reference range applies only to samples taken after fasting for at least 8 hours.    Blood Alcohol level:  Lab Results  Component Value Date   ETH <10 08/13/2020   ETH <10 31/54/0086    Metabolic Disorder Labs: Lab Results  Component Value Date   HGBA1C 9.0 (H) 08/18/2020   MPG 212 08/18/2020   MPG 168.55 02/27/2017   Lab Results  Component Value Date   PROLACTIN 11.7 05/17/2018   PROLACTIN 105.5 (H) 02/28/2017    Lab Results  Component Value Date   CHOL 93 08/18/2020   TRIG 68 08/18/2020   HDL 30 (L) 08/18/2020   CHOLHDL 3.1 08/18/2020   VLDL 14 08/18/2020   LDLCALC 49 08/18/2020   LDLCALC 191 (H) 02/25/2020    Physical Findings: AIMS: Facial and Oral Movements Muscles of Facial Expression: None, normal Lips and Perioral Area: None, normal Jaw: None, normal Tongue: None, normal,Extremity Movements Upper (arms, wrists, hands, fingers): None, normal Lower (legs, knees, ankles, toes): None, normal, Trunk Movements Neck, shoulders, hips: None, normal, Overall Severity Severity of abnormal movements (highest score from questions above): None, normal Incapacitation due to abnormal movements: None, normal Patient's awareness of abnormal movements (rate only patient's report): No Awareness, Dental Status Current problems with teeth and/or dentures?: No Does patient usually wear dentures?: No  CIWA:    COWS:     Musculoskeletal: Strength & Muscle Tone: within normal limits Gait & Station: normal Patient leans: N/A  Psychiatric Specialty Exam: Physical Exam Vitals and nursing note reviewed.  Constitutional:      General: She is not in acute distress.    Appearance: Normal appearance.  HENT:     Head: Normocephalic and atraumatic.     Right Ear: External ear normal.     Left Ear: External ear normal.     Mouth/Throat:     Mouth: Mucous membranes are moist.     Pharynx: Oropharynx is clear.  Eyes:     Extraocular Movements: Extraocular movements intact.     Conjunctiva/sclera: Conjunctivae normal.     Pupils: Pupils are equal, round, and reactive to light.  Cardiovascular:     Rate and Rhythm: Normal rate.     Pulses: Normal pulses.  Pulmonary:     Effort: Pulmonary effort is normal.     Breath sounds: Normal breath sounds.  Abdominal:     General: Abdomen is flat.     Palpations: Abdomen is soft.  Musculoskeletal:        General: No swelling. Normal range of motion.      Cervical back: Normal range of motion and  neck supple.  Skin:    General: Skin is warm and dry.  Neurological:     General: No focal deficit present.     Mental Status: She is alert and oriented to person, place, and time.     Motor: No weakness.     Gait: Gait normal.  Psychiatric:        Attention and Perception: Attention and perception normal.        Mood and Affect: Mood and affect normal.        Speech: Speech normal.        Behavior: Behavior is not withdrawn. Behavior is cooperative.        Thought Content: Thought content is not paranoid or delusional. Thought content does not include homicidal or suicidal ideation.        Judgment: Judgment normal.     Review of Systems  Constitutional: Negative for appetite change and fatigue.  HENT: Negative for rhinorrhea and sore throat.   Eyes: Negative for photophobia and visual disturbance.  Respiratory: Negative for cough and shortness of breath.   Cardiovascular: Negative for chest pain and palpitations.  Gastrointestinal: Negative for constipation, diarrhea, nausea and vomiting.  Endocrine: Negative for cold intolerance and heat intolerance.  Genitourinary: Negative for difficulty urinating and dysuria.  Musculoskeletal: Negative for arthralgias and myalgias.  Skin: Negative for rash and wound.  Allergic/Immunologic: Negative for environmental allergies and food allergies.  Neurological: Negative for dizziness and headaches.  Hematological: Negative for adenopathy. Does not bruise/bleed easily.  Psychiatric/Behavioral: Negative for confusion, hallucinations and suicidal ideas.    Blood pressure 136/84, pulse (!) 105, temperature 99.7 F (37.6 C), temperature source Oral, resp. rate 18, height 5\' 11"  (1.803 m), weight 104.3 kg, SpO2 100 %.Body mass index is 32.08 kg/m.  General Appearance: Casual and Fairly Groomed  Eye Contact:  Good  Speech:  Clear and Coherent and Normal Rate  Volume:  Normal  Mood:  Euthymic  Affect:   Congruent  Thought Process:  Coherent and Linear  Orientation:  Full (Time, Place, and Person)  Thought Content:  Logical  Suicidal Thoughts:  No  Homicidal Thoughts:  No  Memory:  Immediate;   Fair Recent;   Fair Remote;   Fair  Judgement:  Intact  Insight:  Fair  Psychomotor Activity:  Normal  Concentration:  Concentration: Fair  Recall:  [employer] of Knowledge:  Fair  Language:  Fair  Akathisia:  Negative  Handed:  Right  AIMS (if indicated):     Assets:  Desire for Improvement Financial Resources/Insurance Housing Intimacy Resilience  ADL's:  Impaired  Cognition:  Impaired,  Mild  Sleep:  Number of Hours: 8     Treatment Plan Summary: Daily contact with patient to assess and evaluate symptoms and progress in treatment and Medication management   1)Schizoaffective disorder, bipolar type- established problem, improving - All symptoms of catatonia have resolved with Ativan. Will decrease to Ativan 1 mg BID with plan to taper off - Continue Abilify 10 mg daily - Continue Depakote 250 mg BID, valproic acid level 20. Although this level is subtherapeutic, she is clinically euthymic today  2) Diabetes Mellitus, Type 2- established problem, unstable - Metformin 1000 mg BID - Correctional novolog with meals and bedtime - Farxiga 5 mg daily  - Hemoglobin A1c 9, which is improved from 10.5 on 06/23/20.  - Obtain Serum creatinine and potassium for monitoring of Farxiga    3) History of HLD- established, stable - Lipitor 80 mg Daily -  HDL 30, LDL 49, triglycerides 68  4) HTN- established, stable - Continue lisinopril 5 mg daily   08/19/20: Psychiatric exam above reviewed and remains accurate. Assessment and plan above reviewed and updated.     Salley Scarlet, MD 08/19/2020, 12:30 PM

## 2020-08-19 NOTE — Progress Notes (Signed)
Pt has had much improvement today. She states that she feels better, She is able to express herself, affect has brightened and she responds in conversation. Pt has been calm, cooperative and attended groups. Collier Bullock RN

## 2020-08-19 NOTE — BHH Group Notes (Signed)
LCSW Group Therapy Note     08/19/2020 2:20 PM     Type of Therapy/Topic:  Group Therapy:  Balance in Life     Participation Level:  Active     Description of Group:    This group will address the concept of balance and how it feels and looks when one is unbalanced. Patients will be encouraged to process areas in their lives that are out of balance and identify reasons for remaining unbalanced. Facilitators will guide patients in utilizing problem-solving interventions to address and correct the stressor making their life unbalanced. Understanding and applying boundaries will be explored and addressed for obtaining and maintaining a balanced life. Patients will be encouraged to explore ways to assertively make their unbalanced needs known to significant others in their lives, using other group members and facilitator for support and feedback.     Therapeutic Goals:  1.      Patient will identify two or more emotions or situations they have that consume much of in their lives.  2.      Patient will identify signs/triggers that life has become out of balance:  3.      Patient will identify two ways to set boundaries in order to achieve balance in their lives:  4.      Patient will demonstrate ability to communicate their needs through discussion and/or role plays    Summary of Patient Progress: Pt was very participatory in group today. Pt was very constructive in her contribution to conversation regarding balance, she stated that she does affirmations that help her stay balanced in life. She said that she enjoys being "organized in the short term so that she can be successful in there long term."  At the end of the mindfulness activity, patient asked how she could find more mindfulness meditation activities.    Therapeutic Modalities:   Cognitive Behavioral Therapy  Solution-Focused Therapy  Assertiveness Training     Zanna Hawn [place] MSW, [place]  08/19/2020 2:20 PM

## 2020-08-19 NOTE — Progress Notes (Signed)
Recreation Therapy Notes  Date: 08/19/2020  Time: 10:00 am  Location: Craft room   Behavioral response: Appropriate   Intervention Topic: Animal Assisted Therapy   Discussion/Intervention:  Animal Assisted Therapy took place today during group.  Animal Assisted Therapy is the planned inclusion of an animal in a patient's treatment plan. The patients were able to engage in therapy with an animal during group. Participants were educated on what a service dog is and the different between a support dog and a service dog. Patient were informed on the many animal needs there are and how their needs are similar. Individuals were enlightened on the process to get a service animal or support animal. Patients got the opportunity to pet the animal and were offered emotional support from the animal and staff.  Clinical Observations/Feedback:  Patient came to group and was on topic and was focused on what peers and staff had to say. Participant shared their experiences and history with animals. Individual was social with peers, staff and animal while participating in group.  [name] LRT/CTRS         Olamide [name] 08/19/2020 12:36 PM

## 2020-08-19 NOTE — BHH Counselor (Signed)
CSW attempted to contact [facility] to schedule the patient's aftercare appointment.  CSW left message with the requested information and left fellow CSW contact information for follow up phone call to schedule.  Assunta Curtis, MSW, LCSW 08/19/2020 4:20 PM

## 2020-08-20 LAB — GLUCOSE, CAPILLARY
Glucose-Capillary: 191 mg/dL — ABNORMAL HIGH (ref 70–99)
Glucose-Capillary: 177 mg/dL — ABNORMAL HIGH (ref 70–99)

## 2020-08-20 LAB — HEMOGLOBIN A1C
Hgb A1c MFr Bld: 8.9 % — ABNORMAL HIGH (ref 4.8–5.6)
Mean Plasma Glucose: 209 mg/dL

## 2020-08-20 MED ORDER — DIVALPROEX SODIUM 250 MG PO DR TAB: 250.0000 mg | DELAYED_RELEASE_TABLET | Freq: Two times a day (BID) | ORAL | 1 refills | Status: DC

## 2020-08-20 MED ORDER — LORAZEPAM 1 MG PO TABS: 1.0000 mg | ORAL_TABLET | Freq: Three times a day (TID) | ORAL | 0 refills | Status: DC

## 2020-08-20 MED ORDER — ARIPIPRAZOLE 10 MG PO TABS: 10.0000 mg | ORAL_TABLET | Freq: Every day | ORAL | 1 refills | Status: DC

## 2020-08-20 NOTE — BHH Counselor (Signed)
CSW received information from [facility] that pt has an appointment scheduled for 08/24/2020 at 9:30AM. They will be contacting pt at [phone]. Appointment added to follow up provider information.   Chalmers Guest. Guerry Bruin, MSW, [place], [place] 08/20/2020 10:27 AM

## 2020-08-20 NOTE — BHH Suicide Risk Assessment (Signed)
[facility] Discharge Suicide Risk Assessment   Principal Problem: Schizoaffective disorder, bipolar type [facility]) Discharge Diagnoses: Principal Problem:   Schizoaffective disorder, bipolar type ([place]) Active Problems:   Cannabis use disorder, moderate, dependence ([place])   Diabetes mellitus ([place])   Essential hypertension   Total Time spent with patient: 35 minutes- 25 minutes direct patient care, 10 minutes documentation, coordination of care, and prescriptions  Musculoskeletal: Strength & Muscle Tone: within normal limits Gait & Station: normal Patient leans: N/A  Psychiatric Specialty Exam: Review of Systems  Constitutional: Negative for appetite change and fatigue.  HENT: Negative for rhinorrhea and sore throat.   Eyes: Negative for photophobia and visual disturbance.  Respiratory: Negative for cough and shortness of breath.   Cardiovascular: Negative for chest pain and palpitations.  Gastrointestinal: Negative for constipation, diarrhea, nausea and vomiting.  Endocrine: Negative for cold intolerance and heat intolerance.  Genitourinary: Negative for difficulty urinating and dysuria.  Musculoskeletal: Negative for arthralgias and myalgias.  Skin: Negative for rash and wound.  Allergic/Immunologic: Negative for environmental allergies and food allergies.  Neurological: Negative for dizziness and headaches.  Hematological: Negative for adenopathy. Does not bruise/bleed easily.  Psychiatric/Behavioral: Negative for confusion, decreased concentration, dysphoric mood and suicidal ideas. The patient is not nervous/anxious.     Blood pressure 129/75, pulse (!) 103, temperature 98.6 F (37 C), temperature source Oral, resp. rate 17, height 5\' 11"  (1.803 m), weight 104.3 kg, SpO2 100 %.Body mass index is 32.08 kg/m.  General Appearance: Well Groomed  Engineer, water::  Good  Speech:  Clear and Coherent and Normal Rate  Volume:  Normal  Mood:  Euthymic  Affect:  Congruent  Thought Process:   Coherent and Linear  Orientation:  Full (Time, Place, and Person)  Thought Content:  Logical  Suicidal Thoughts:  No  Homicidal Thoughts:  No  Memory:  Immediate;   Good Recent;   Good Remote;   Good  Judgement:  Fair  Insight:  Fair  Psychomotor Activity:  Normal  Concentration:  Good  Recall:  Good  Fund of Knowledge:Good  Language: Good  Akathisia:  Negative  Handed:  Right  AIMS (if indicated):     Assets:  Communication Skills Desire for Improvement Financial Resources/Insurance Housing Intimacy Physical Health Social Support  Sleep:  Number of Hours: 7.75  Cognition: WNL  ADL's:  Intact   Mental Status Per Nursing Assessment::   On Admission:  NA  Demographic Factors:  NA  Loss Factors: NA  Historical Factors: NA  Risk Reduction Factors:   Sense of responsibility to family, Religious beliefs about death, Employed, Living with another person, especially a relative, Positive social support, Positive therapeutic relationship and Positive coping skills or problem solving skills  Continued Clinical Symptoms:  Bipolar Disorder:   Mixed [place] Previous Psychiatric Diagnoses and Treatments  Cognitive Features That Contribute To Risk:  None    Suicide Risk:  Minimal: No identifiable suicidal ideation.  Patients presenting with no risk factors but with morbid ruminations; may be classified as minimal risk based on the severity of the depressive symptoms   Follow-up Information    [facility]. Call on 08/24/2020.   Why: Your appointment is scheduled for 08/24/2020 at 9:30am via phone. They will be contacting you at [phone]. Thanks! Contact information: [address]  [place] [address] [phone]               Plan Of Care/Follow-up recommendations:  Activity:  as tolerated Diet:  diabetic diet  Salley Scarlet,  MD 08/20/2020, 11:24 AM

## 2020-08-20 NOTE — Plan of Care (Signed)
Cooperative and pleasant on approach. Denies SI/HI/AVH. Continues to experience some paranoia and suspiciousness. Otherwise thought process organized. Taking medications as scheduled. Attending groups with no sign of distress. Encouragements and support provided. Safety monitored as expected.

## 2020-08-20 NOTE — Progress Notes (Signed)
Recreation Therapy Notes   Date: 08/20/2020  Time: 9:30 am   Location: Court yard   Behavioral response: Appropriate  Intervention Topic: Leisure   Discussion/Intervention:  Group content today was focused on leisure. The group defined what leisure is and some positive leisure activities they participate in. Individuals identified the difference between good and bad leisure. Participants expressed how they feel after participating in the leisure of their choice. The group discussed how they go about picking a leisure activity and if others are involved in their leisure activities. The patient stated how many leisure activities they have to choose from and reasons why it is important to have leisure time. Individuals participated in the intervention "Exploration of Leisure" where they had a chance to identify new leisure activities as well as benefits of leisure. Clinical Observations/Feedback: Patient came to group and stated that she participates in leisure by praying and soul searching and positive affirmations. Participant identified listening to music,cooking, and talking as things she enjoy. Individual was social with peers and staff while participating in the intervention. Tila Millirons LRT/CTRS         Kendalynn Wideman 08/20/2020 10:55 AM

## 2020-08-20 NOTE — Progress Notes (Signed)
  [facility] Adult Case Management Discharge Plan :  Will you be returning to the same living situation after discharge:  Yes,  pt plans to return home. At discharge, do you have transportation home?: Yes,  CSW to arrange transportation home. Do you have the ability to pay for your medications: Yes,  [facility].  Release of information consent forms completed and in the chart;  Patient's signature needed at discharge.  Patient to Follow up at:  Follow-up Information    [facility]. Call on 08/24/2020.   Why: Your appointment is scheduled for 08/24/2020 at 9:30am via phone. They will be contacting you at [phone]. Thanks! Contact information: [place]  [place] [address] [phone]               Next level of care provider has access to [place] and Suicide Prevention discussed: Yes,  SPE completed with Danne Baxter, mother.  Have you used any form of tobacco in the last 30 days? (Cigarettes, Smokeless Tobacco, Cigars, and/or Pipes): Yes  Has patient been referred to the Quitline?: Patient refused referral  Patient has been referred for addiction treatment: Pt. refused referral  Shirl Harris, LCSW 08/20/2020, 11:34 AM

## 2020-08-20 NOTE — BHH Counselor (Signed)
CSW attempted to contact [facility] Alvie Heidelberg [phone]) to update regarding pt. Unable to make contact. Voicemail left with contact information for follow up.   Chalmers Guest. Guerry Bruin, MSW, [place], [place] 08/20/2020 2:49 PM

## 2020-08-20 NOTE — Discharge Summary (Signed)
Physician Discharge Summary Note  Patient:  [name] is an 45 y.o., female MRN:  [id number] DOB:  March 23, 1976 Patient phone:  [phone] (home)  Patient address:   [place] [address],  Total Time spent with patient: 35 minutes- 25 minutes direct patient care, 10 minutes documentation, coordination of care, and prescriptions  Date of Admission:  08/16/2020 Date of Discharge: 08/20/2020  Reason for Admission:  [name] a 45 y.o.femalepatient admitted after being found wandering and confused near her apartment  Principal Problem: Schizoaffective disorder, bipolar type [facility]) Discharge Diagnoses: Principal Problem:   Schizoaffective disorder, bipolar type ([place]) Active Problems:   Cannabis use disorder, moderate, dependence ([place])   Diabetes mellitus ([place])   Essential hypertension   Past Psychiatric History: Schizoaffective disorder, bipolar type, cannabis use disorder. Has previously been tried on [employer], wellbutrin, buspar, depakote, doxepin, Haldol, Zyprexa, Risperidone, Geodon, and Ambien. She feels abilify and depakote work the best. No past suicide attempts  Past Medical History:  Past Medical History:  Diagnosis Date  . Abnormal Pap smear   . Bipolar 1 disorder ([place])   . Diabetes mellitus without complication ([place])   . Fibroids   . Hypertension   . IBS (irritable bowel syndrome)     Past Surgical History:  Procedure Laterality Date  . CERVICAL BIOPSY    . CHOLECYSTECTOMY    . TUBAL LIGATION     Family History:  Family History  Problem Relation Age of Onset  . Diabetes Father   . Diabetes Paternal Grandmother   . Diabetes Maternal Grandmother   . Mental illness Cousin   . Healthy Mother    Family Psychiatric  History: Cousin with unknown mental illness Social History:  Social History   Substance and Sexual Activity  Alcohol Use Yes   Comment: occasional     Social History   Substance and Sexual Activity  Drug Use  Yes  . Types: Marijuana    Social History   Socioeconomic History  . Marital status: Single    Spouse name: Not on file  . Number of children: 2  . Years of education: Not on file  . Highest education level: Master's degree (e.g., MA, MS, MEng, MEd, MSW, MBA)  Occupational History  . Not on file  Tobacco Use  . Smoking status: Current Every Day Smoker    Packs/day: 1.00    Years: 3.00    Pack years: 3.00    Types: Cigarettes, E-cigarettes  . Smokeless tobacco: Never Used  Vaping Use  . Vaping Use: Never used  Substance and Sexual Activity  . Alcohol use: Yes    Comment: occasional  . Drug use: Yes    Types: Marijuana  . Sexual activity: Not Currently    Birth control/protection: Surgical  Other Topics Concern  . Not on file  Social History Narrative  . Not on file   Social Determinants of Health   Financial Resource Strain: Not on file  Food Insecurity: Not on file  Transportation Needs: Not on file  Physical Activity: Not on file  Stress: Not on file  Social Connections: Not on file    Hospital Course:  [name] a 45 y.o.femalepatient admitted after being found wandering and confused near her apartment. She was restarted on home Abilify 10 mg daily and Depakote 250 mg BID. She declined long-acting injectable of Abilify. Depakote level subtherapeutic at 20, but patient is clinically euthymic. While she was here she was also noted to have several  symptoms of catatonia hypoactivity, staring, rigidity, and withdrawal that resolved with Ativan 1 mg PO TID. At time of discharge she was fully oriented to person, place, situation, and time. She was able to name the past three presidents, spell the word "Table" forward and backward, and name three states. She was also able to name address and pharmacy. She denied suicidal ideations, homicidal ideations, visual hallucinations, and auditory hallucinations. She plans to follow-up with [facility] for outpatient care.   Physical  Findings: AIMS: Facial and Oral Movements Muscles of Facial Expression: None, normal Lips and Perioral Area: None, normal Jaw: None, normal Tongue: None, normal,Extremity Movements Upper (arms, wrists, hands, fingers): None, normal Lower (legs, knees, ankles, toes): None, normal, Trunk Movements Neck, shoulders, hips: None, normal, Overall Severity Severity of abnormal movements (highest score from questions above): None, normal Incapacitation due to abnormal movements: None, normal Patient's awareness of abnormal movements (rate only patient's report): No Awareness, Dental Status Current problems with teeth and/or dentures?: No Does patient usually wear dentures?: No  CIWA:    COWS:     Musculoskeletal: Strength & Muscle Tone: within normal limits Gait & Station: normal Patient leans: N/A  Psychiatric Specialty Exam: Physical Exam Vitals and nursing note reviewed.  Constitutional:      Appearance: Normal appearance.  HENT:     Head: Normocephalic and atraumatic.     Right Ear: External ear normal.     Left Ear: External ear normal.     Nose: Nose normal.     Mouth/Throat:     Mouth: Mucous membranes are moist.     Pharynx: Oropharynx is clear.  Eyes:     Extraocular Movements: Extraocular movements intact.     Conjunctiva/sclera: Conjunctivae normal.     Pupils: Pupils are equal, round, and reactive to light.  Cardiovascular:     Rate and Rhythm: Normal rate and regular rhythm.     Pulses: Normal pulses.     Heart sounds: Normal heart sounds.  Pulmonary:     Effort: Pulmonary effort is normal.     Breath sounds: Normal breath sounds.  Abdominal:     General: Abdomen is flat.     Palpations: Abdomen is soft.  Musculoskeletal:        General: No swelling. Normal range of motion.     Cervical back: Normal range of motion and neck supple.  Skin:    General: Skin is warm and dry.  Neurological:     General: No focal deficit present.     Mental Status: She is alert  and oriented to person, place, and time.  Psychiatric:        Mood and Affect: Mood normal.        Behavior: Behavior normal.        Thought Content: Thought content normal.        Judgment: Judgment normal.     Review of Systems  Constitutional: Negative for appetite change and fatigue.  HENT: Negative for rhinorrhea and sore throat.   Eyes: Negative for photophobia and visual disturbance.  Respiratory: Negative for cough and shortness of breath.   Cardiovascular: Negative for chest pain and palpitations.  Gastrointestinal: Negative for constipation, diarrhea, nausea and vomiting.  Endocrine: Negative for cold intolerance and heat intolerance.  Genitourinary: Negative for difficulty urinating and dysuria.  Musculoskeletal: Negative for arthralgias and myalgias.  Skin: Negative for rash and wound.  Allergic/Immunologic: Negative for environmental allergies and food allergies.  Neurological: Negative for dizziness and headaches.  Hematological: Negative  for adenopathy. Does not bruise/bleed easily.  Psychiatric/Behavioral: Negative for confusion, decreased concentration, dysphoric mood and suicidal ideas. The patient is not nervous/anxious.     Blood pressure 129/75, pulse (!) 103, temperature 98.6 F (37 C), temperature source Oral, resp. rate 17, height '5\' 11"'  (1.803 m), weight 104.3 kg, SpO2 100 %.Body mass index is 32.08 kg/m.  General Appearance: Well Groomed  Engineer, water::  Good  Speech:  Clear and Coherent and Normal Rate  Volume:  Normal  Mood:  Euthymic  Affect:  Congruent  Thought Process:  Coherent and Linear  Orientation:  Full (Time, Place, and Person)  Thought Content:  Logical  Suicidal Thoughts:  No  Homicidal Thoughts:  No  Memory:  Immediate;   Good Recent;   Good Remote;   Good  Judgement:  Fair  Insight:  Fair  Psychomotor Activity:  Normal  Concentration:  Good  Recall:  Good  Fund of Knowledge:Good  Language: Good  Akathisia:  Negative  Handed:   Right  AIMS (if indicated):     Assets:  Communication Skills Desire for Improvement Financial Resources/Insurance Housing Intimacy Physical Health Social Support  Sleep:  Number of Hours: 7.75  Cognition: WNL  ADL's:  Intact        Have you used any form of tobacco in the last 30 days? (Cigarettes, Smokeless Tobacco, Cigars, and/or Pipes): Yes  Has this patient used any form of tobacco in the last 30 days? (Cigarettes, Smokeless Tobacco, Cigars, and/or Pipes)  Yes, A prescription for an FDA-approved tobacco cessation medication was offered at discharge and the patient refused  Blood Alcohol level:  Lab Results  Component Value Date   [employer] <10 08/13/2020   ETH <10 05/05/1593    Metabolic Disorder Labs:  Lab Results  Component Value Date   HGBA1C 8.9 (H) 08/19/2020   MPG 209 08/19/2020   MPG 212 08/18/2020   Lab Results  Component Value Date   PROLACTIN 11.7 05/17/2018   PROLACTIN 105.5 (H) 02/28/2017   Lab Results  Component Value Date   CHOL 93 08/18/2020   TRIG 68 08/18/2020   HDL 30 (L) 08/18/2020   CHOLHDL 3.1 08/18/2020   VLDL 14 08/18/2020   LDLCALC 49 08/18/2020   LDLCALC 191 (H) 02/25/2020    See Psychiatric Specialty Exam and Suicide Risk Assessment completed by Attending Physician prior to discharge.  Discharge destination:  Home  Is patient on multiple antipsychotic therapies at discharge:  No   Has Patient had three or more failed trials of antipsychotic monotherapy by history:  No  Recommended Plan for Multiple Antipsychotic Therapies: NA  Discharge Instructions    Diet Carb Modified   Complete by: As directed    Increase activity slowly   Complete by: As directed      Allergies as of 08/20/2020   No Known Allergies     Medication List    STOP taking these medications   gabapentin 100 MG capsule Commonly known as: NEURONTIN     TAKE these medications     Indication  Accu-Chek Guide Me w/Device Kit Use to check FSBS BID. Dx:  E11.42  Indication: diabetes   Accu-Chek Guide test strip Generic drug: glucose blood Use to check FSBS BID. Dx: E11.42  Indication: diabetes   Accu-Chek Softclix Lancets lancets Use to check FSBS BID. Dx: E11.42  Indication: diabetes   ARIPiprazole 10 MG tablet Commonly known as: ABILIFY Take 1 tablet (10 mg total) by mouth daily.  Indication: Schizophrenia  atorvastatin 80 MG tablet Commonly known as: LIPITOR Take 1 tablet (80 mg total) by mouth daily.  Indication: High Amount of Fats in the Blood   dapagliflozin propanediol 5 MG Tabs tablet Commonly known as: Farxiga Take 1 tablet (5 mg total) by mouth daily before breakfast.  Indication: Type 2 Diabetes   divalproex 250 MG DR tablet Commonly known as: DEPAKOTE Take 1 tablet (250 mg total) by mouth 2 (two) times daily.  Indication: Schizophrenia   lisinopril 5 MG tablet Commonly known as: ZESTRIL Take 1 tablet (5 mg total) by mouth daily.  Indication: High Blood Pressure Disorder   LORazepam 1 MG tablet Commonly known as: ATIVAN Take 1 tablet (1 mg total) by mouth 3 (three) times daily.  Indication: Catatonia   metFORMIN 1000 MG tablet Commonly known as: GLUCOPHAGE Take 1 tablet (1,000 mg total) by mouth 2 (two) times daily with a meal.  Indication: Type 2 Diabetes   ondansetron 4 MG disintegrating tablet Commonly known as: Zofran ODT Take 1 tablet (4 mg total) by mouth every 8 (eight) hours as needed for nausea or vomiting.  Indication: Nausea and Vomiting   TRUEplus Pen Needles 32G X 4 MM Misc Generic drug: Insulin Pen Needle Use as instructed to inject Victoza daily.  Indication: diabetes   Victoza 18 MG/3ML Sopn Generic drug: liraglutide Inject 1.8 mg into the skin daily.  Indication: Type 2 Diabetes       Follow-up Information    [facility]. Call on 08/24/2020.   Why: Your appointment is scheduled for 08/24/2020 at 9:30am via phone. They will be contacting you at [phone]. Thanks! Contact  information: [place]  [place] [address] [phone]               Follow-up recommendations:  Activity:  as tolerated Diet:  low carb diabetic diet  Comments:  30-day scripts with one refill sent to [facility] per patient request.   Signed: Salley Scarlet, MD 08/20/2020, 11:27 AM

## 2020-08-20 NOTE — Progress Notes (Signed)
Patient alert and oriented x 4, affect is flat but brightens upon approach, thoughts are organized and coherent no bizarre behavior noted. Patient currently denies SI/HI/AVH, she appears less anxious and receptive to staff. Patient was complaint with her scheduled night medication, no distress noted, she was offered emotional support and encouragement, 15 minutes safety checks maintained will continue to monitor.

## 2020-08-20 NOTE — Progress Notes (Signed)
Patient denies SI/HI, denies A/V hallucinations. Patient verbalizes understanding of discharge instructions, follow up care and prescriptions. Patient given all belongings from [facility] locker. Patient escorted out by staff, transported by safe transport.

## 2020-08-23 ENCOUNTER — Telehealth: Payer: Self-pay | Admitting: *Deleted

## 2020-08-23 NOTE — Telephone Encounter (Signed)
Transition Care Management Unsuccessful Follow-up Telephone Call  Date of discharge and from where:  08/20/2020 [facility]   Attempts:  1st Attempt  Reason for unsuccessful TCM follow-up call:  Unable to leave message

## 2020-08-23 NOTE — BHH Counselor (Signed)
CSW returned call to [facility] Alvie Heidelberg [phone]) regarding pt discharge and follow up. [place] was updated regarding discharge. No other concerns expressed. Contact ended without incident.   Chalmers Guest. Guerry Bruin, MSW, LCSW, LCAS 08/23/2020 1:42 PM

## 2020-08-24 NOTE — Telephone Encounter (Signed)
Transition Care Management Follow-up Telephone Call  Date of discharge and from where: 08/20/2020 [facility]  How have you been since you were released from the hospital? "Still recovering but doing better"  Any questions or concerns? No  Items Reviewed:  Did the pt receive and understand the discharge instructions provided? Yes   Medications obtained and verified? Yes   Other? N/A  Any new allergies since your discharge? No   Dietary orders reviewed? Yes  Do you have support at home? Yes   Home Care and Equipment/Supplies: Were home health services ordered? not applicable If so, what is the name of the agency? N/A  Has the agency set up a time to come to the patient's home? not applicable Were any new equipment or medical supplies ordered?  No What is the name of the medical supply agency? N/A Were you able to get the supplies/equipment? not applicable Do you have any questions related to the use of the equipment or supplies? No  Functional Questionnaire: (I = Independent and D = Dependent) ADLs: I  Bathing/Dressing- I  Meal Prep- I  Eating- I  Maintaining continence- I  Transferring/Ambulation- I  Managing Meds- I  Follow up appointments reviewed:   PCP Hospital f/u appt confirmed? Pt states that she would like to call herself to make this appointment.   [facility] f/u appt confirmed? N/A   Are transportation arrangements needed? N/A  If their condition worsens, is the pt aware to call PCP or go to the Emergency Dept.? Yes  Was the patient provided with contact information for the PCP's office or ED? Yes  Was to pt encouraged to call back with questions or concerns? Yes

## 2020-08-24 NOTE — Telephone Encounter (Signed)
Transition Care Management Unsuccessful Follow-up Telephone Call  Date of discharge and from where:  08/20/2020 [facility]  Attempts:  2nd Attempt  Reason for unsuccessful TCM follow-up call:  Left voice message

## 2020-09-11 ENCOUNTER — Ambulatory Visit
Admission: EM | Admit: 2020-09-11 | Discharge: 2020-09-11 | Disposition: A | Discharge status: Home / Self Care | Payer: Medicaid Other | Attending: Family Medicine | Admitting: Family Medicine

## 2020-09-11 DIAGNOSIS — H60391 Other infective otitis externa, right ear: Secondary | ICD-10-CM

## 2020-09-11 HISTORY — DX: Bipolar disorder, current episode manic without psychotic features, moderate: F31.12

## 2020-09-11 HISTORY — DX: Type 2 diabetes mellitus without complications: E11.9

## 2020-09-11 MED ORDER — IBUPROFEN 800 MG PO TABS: 800.0000 mg | ORAL_TABLET | Freq: Three times a day (TID) | ORAL | 0 refills | Status: DC

## 2020-09-11 MED ORDER — NEOMYCIN-POLYMYXIN-HC 3.5-10000-1 OT SUSP
4.0000 [drp] | Freq: Three times a day (TID) | OTIC | 0 refills | Status: DC
Start: 2020-09-11 — End: 2021-06-01

## 2020-09-11 NOTE — ED Provider Notes (Signed)
  [facility]   [id number] 09/11/20 Arrival Time: 1006  ASSESSMENT & PLAN:  1. Infective otitis externa of right ear     Meds ordered this encounter  Medications  . neomycin-polymyxin-hydrocortisone (CORTISPORIN) 3.5-10000-1 OTIC suspension    Sig: Place 4 drops into the right ear 3 (three) times daily.    Dispense:  10 mL    Refill:  0  . ibuprofen (ADVIL) 800 MG tablet    Sig: Take 1 tablet (800 mg total) by mouth 3 (three) times daily with meals.    Dispense:  21 tablet    Refill:  0    Discussed typical duration of symptoms. OTC symptom care as needed. Work note provided.   Follow-up Information    [facility].   Why: If worsening or failing to improve as anticipated. Contact information: [place] [address] [phone]               Reviewed expectations re: course of current medical issues. Questions answered. Outlined signs and symptoms indicating need for more acute intervention. Patient verbalized understanding. After Visit Summary given.   SUBJECTIVE: History from: patient.  [name] is a 45 y.o. female who presents with complaint of right otalgia; without drainage; without bleeding. Onset abrupt, past 1-2 d. Recent cold symptoms: none. Fever: no. Does use Q-tips in ears. Overall normal PO intake without n/v. Sick contacts: no. OTC treatment: none reported. Decreased hearing from R ear.  Social History   Tobacco Use  Smoking Status Current Some Day Smoker  Smokeless Tobacco Never Used      OBJECTIVE:  Vitals:   09/11/20 1016  BP: 130/87  Pulse: 62  Resp: 18  Temp: 98.4 F (36.9 C)  SpO2: 98%     General appearance: alert Ear Canal: edema and inflammation on the right; cerumen blocks part of TM Visualized TM: right: without signs of infection Neck: supple without LAD Lungs: unlabored respirations, symmetrical air entry; cough: absent; no respiratory  distress Skin: warm and dry Psychological: alert and cooperative; normal mood and affect  No Known Allergies  Past Medical History:  Diagnosis Date  . Bipolar 1 disorder with moderate mania ([place])   . Diabetes mellitus without complication ([place])    Family History  Family history unknown: Yes   Social History   Socioeconomic History  . Marital status: Single    Spouse name: Not on file  . Number of children: Not on file  . Years of education: Not on file  . Highest education level: Not on file  Occupational History  . Not on file  Tobacco Use  . Smoking status: Current Some Day Smoker  . Smokeless tobacco: Never Used  Vaping Use  . Vaping Use: Some days  Substance and Sexual Activity  . Alcohol use: Not Currently  . Drug use: Not Currently  . Sexual activity: Not on file  Other Topics Concern  . Not on file  Social History Narrative  . Not on file   Social Determinants of Health   Financial Resource Strain: Not on file  Food Insecurity: Not on file  Transportation Needs: Not on file  Physical Activity: Not on file  Stress: Not on file  Social Connections: Not on file  Intimate Partner Violence: Not on file            Vanessa Kick, MD 09/11/20 1030

## 2020-09-11 NOTE — ED Triage Notes (Signed)
Pt presents with c/o right ear pain that began a few nights ago after getting water in ear , also reports hearing loss

## 2020-09-13 ENCOUNTER — Ambulatory Visit
Admission: EM | Admit: 2020-09-13 | Discharge: 2020-09-13 | Disposition: A | Discharge status: Home / Self Care | Payer: Medicaid Other | Attending: Emergency Medicine | Admitting: Emergency Medicine

## 2020-09-13 ENCOUNTER — Encounter: Payer: Self-pay | Admitting: Emergency Medicine

## 2020-09-13 ENCOUNTER — Other Ambulatory Visit: Payer: Self-pay

## 2020-09-13 ENCOUNTER — Encounter: Payer: Self-pay | Admitting: Behavioral Health

## 2020-09-13 DIAGNOSIS — H66001 Acute suppurative otitis media without spontaneous rupture of ear drum, right ear: Secondary | ICD-10-CM

## 2020-09-13 DIAGNOSIS — H6123 Impacted cerumen, bilateral: Secondary | ICD-10-CM

## 2020-09-13 MED ORDER — AMOXICILLIN-POT CLAVULANATE 875-125 MG PO TABS: 1.0000 | ORAL_TABLET | Freq: Two times a day (BID) | ORAL | 0 refills | Status: AC

## 2020-09-13 MED ORDER — CARBAMIDE PEROXIDE 6.5 % OT SOLN: 5.0000 [drp] | Freq: Two times a day (BID) | OTIC | 0 refills | Status: DC

## 2020-09-13 NOTE — ED Notes (Signed)
Called patient to come in building for a room

## 2020-09-13 NOTE — Discharge Instructions (Addendum)
Begin Augmentin twice daily for the next 10 days Continue eardrops on right side Tylenol and ibuprofen for pain Debrox to help remove wax on left ear Follow-up if not improving or worsening

## 2020-09-13 NOTE — ED Triage Notes (Signed)
Right ear pain, including in the ear and in front of ear and under the ear.  Has been using ibuprofen and ear drops that were  prescribed Saturday and no improvement.  Patient notices random shooting pain in right ear.    No cough, runny nose or fever

## 2020-09-14 NOTE — ED Provider Notes (Signed)
[facility]    CSN: [id number] Arrival date & time: 09/13/20  1711      History   Chief Complaint Chief Complaint  Patient presents with  . Otalgia    HPI [name] is a 45 y.o. female history of hypertension, IBS, DM type II, presenting today for evaluation of ear pain.  Patient was seen here approximately 2 days ago for ear pain.  Started on Cortisporin drops for otitis externa.  Was noted to have wax at the time, but not removed.  Patient continues to have significant pain surrounding ear and externally.  She denies any significant associated URI symptoms of congestion or cough.  Denies fevers.  HPI  Past Medical History:  Diagnosis Date  . Abnormal Pap smear   . Bipolar 1 disorder ([place])   . Bipolar 1 disorder with moderate mania ([place])   . Diabetes mellitus without complication ([place])   . Fibroids   . Hypertension   . IBS (irritable bowel syndrome)     Patient Active Problem List   Diagnosis Date Noted  . Essential hypertension 04/08/2019  . Class 2 severe obesity due to excess calories with serious comorbidity and body mass index (BMI) of 39.0 to 39.9 in adult ([place]) 04/08/2019  . Tobacco dependence 04/08/2019  . Schizoaffective disorder, bipolar type ([place]) 05/18/2018  . Diabetes mellitus ([place]) 08/10/2016  . Bipolar disorder, curr episode mixed, severe, with psychotic features ([place]) 08/07/2016  . Cannabis use disorder, moderate, dependence ([place]) 08/07/2016  . CHOLECYSTITIS, UNSPECIFIED 06/12/2008  . BILIARY COLIC 75/91/6384  . CERUMEN IMPACTION, BILATERAL 01/17/2008  . PHARYNGITIS, VIRAL 01/17/2008  . NECK PAIN 01/17/2008    Past Surgical History:  Procedure Laterality Date  . CERVICAL BIOPSY    . CHOLECYSTECTOMY    . ESSURE TUBAL LIGATION    . HIATAL HERNIA REPAIR    . TUBAL LIGATION      OB History    Gravida  7   Para  2   Term  2   Preterm  0   AB  5   Living        SAB  0   IAB  4   Ectopic  1   Multiple      Live  Births               Home Medications    Prior to Admission medications   Medication Sig Start Date End Date Taking? Authorizing Provider  amoxicillin-clavulanate (AUGMENTIN) 875-125 MG tablet Take 1 tablet by mouth every 12 (twelve) hours for 10 days. 09/13/20 09/23/20 Yes Rylin Seavey C, PA-C  ARIPiprazole (ABILIFY) 10 MG tablet Take 1 tablet (10 mg total) by mouth daily. 08/20/20  Yes Salley Scarlet, MD  atorvastatin (LIPITOR) 80 MG tablet Take 1 tablet (80 mg total) by mouth daily. 03/11/20  Yes [name] Bang, DO  carbamide peroxide (DEBROX) 6.5 % OTIC solution Place 5 drops into the left ear 2 (two) times daily. 09/13/20  Yes Makinna Andy C, PA-C  dapagliflozin propanediol (FARXIGA) 5 MG TABS tablet Take 1 tablet (5 mg total) by mouth daily before breakfast. 06/23/20  Yes Minette Brine, Amy J, NP  divalproex (DEPAKOTE) 250 MG DR tablet Take 1 tablet (250 mg total) by mouth 2 (two) times daily. 08/20/20  Yes Salley Scarlet, MD  ibuprofen (ADVIL) 800 MG tablet Take 1 tablet (800 mg total) by mouth 3 (three) times daily with meals. 09/11/20  Yes Vanessa Kick, MD  liraglutide ([place]) 18 MG/3ML  SOPN Inject 1.8 mg into the skin daily. 06/15/20  Yes [name] Bang, DO  lisinopril (ZESTRIL) 5 MG tablet Take 1 tablet (5 mg total) by mouth daily. 03/11/20  Yes [name] Bang, DO  LORazepam (ATIVAN) 1 MG tablet Take 1 tablet (1 mg total) by mouth 3 (three) times daily. 08/20/20  Yes Salley Scarlet, MD  metFORMIN (GLUCOPHAGE) 1000 MG tablet Take 1 tablet (1,000 mg total) by mouth 2 (two) times daily with a meal. 06/23/20  Yes Minette Brine, Amy J, NP  neomycin-polymyxin-hydrocortisone (CORTISPORIN) 3.5-10000-1 OTIC suspension Place 4 drops into the right ear 3 (three) times daily. 09/11/20  Yes Hagler, Aaron Edelman, MD  Accu-Chek Softclix Lancets lancets Use to check FSBS BID. Dx: E11.42 03/11/20   [name] Bang, DO  ARIPiprazole (ABILIFY) 10 MG tablet Take 10 mg by  mouth daily. 08/20/20   [provider]  Blood Glucose Monitoring Suppl (ACCU-CHEK GUIDE ME) w/Device KIT Use to check FSBS BID. Dx: E11.42 03/11/20   [name] Bang, DO  divalproex (DEPAKOTE) 250 MG DR tablet Take 250 mg by mouth 2 (two) times daily. 08/20/20   [provider]  FARXIGA 5 MG TABS tablet Take 5 mg by mouth every morning. 06/23/20   [provider]  glucose blood (ACCU-CHEK GUIDE) test strip Use to check FSBS BID. Dx: E11.42 Patient not taking: Reported on 08/15/2020 03/11/20   [name] Bang, DO  Insulin Pen Needle (TRUEPLUS PEN NEEDLES) 32G X 4 MM MISC Use as instructed to inject Victoza daily. 03/11/20   [name] Bang, DO  LORazepam (ATIVAN) 1 MG tablet Take 1 mg by mouth 3 (three) times daily. 08/20/20   [provider]  metFORMIN (GLUCOPHAGE) 1000 MG tablet Take 1,000 mg by mouth 2 (two) times daily. 06/23/20   [provider]  ondansetron (ZOFRAN ODT) 4 MG disintegrating tablet Take 1 tablet (4 mg total) by mouth every 8 (eight) hours as needed for nausea or vomiting. Patient not taking: Reported on 08/15/2020 07/03/20   Suzy Bouchard, PA-C    Family History Family History  Problem Relation Age of Onset  . Diabetes Father   . Diabetes Paternal Grandmother   . Diabetes Maternal Grandmother   . Mental illness Cousin   . Healthy Mother     Social History Social History   Tobacco Use  . Smoking status: Current Some Day Smoker  . Smokeless tobacco: Never Used  Vaping Use  . Vaping Use: Some days  Substance Use Topics  . Alcohol use: Not Currently    Comment: occasional  . Drug use: Not Currently    Types: Marijuana     Allergies   Patient has no known allergies.   Review of Systems Review of Systems  Constitutional: Negative for activity change, appetite change, chills, fatigue and fever.  HENT: Positive for ear pain. Negative for congestion, rhinorrhea, sinus pressure, sore throat  and trouble swallowing.   Eyes: Negative for discharge and redness.  Respiratory: Negative for cough, chest tightness and shortness of breath.   Cardiovascular: Negative for chest pain.  Gastrointestinal: Negative for abdominal pain, diarrhea, nausea and vomiting.  Musculoskeletal: Negative for myalgias.  Skin: Negative for rash.  Neurological: Negative for dizziness, light-headedness and headaches.     Physical Exam Triage Vital Signs ED Triage Vitals  Enc Vitals Group     BP 09/13/20 1939 131/78     Pulse Rate 09/13/20 1939 92     Resp 09/13/20 1939 20  Temp 09/13/20 1939 98.8 F (37.1 C)     Temp Source 09/13/20 1939 Oral     SpO2 09/13/20 1939 96 %     Weight --      Height --      Head Circumference --      Peak Flow --      Pain Score 09/13/20 1934 10     Pain Loc --      Pain Edu? --      Excl. in GC? --    No data found.  Updated Vital Signs BP 131/78 (BP Location: Left Arm)   Pulse 92   Temp 98.8 F (37.1 C) (Oral)   Resp 20   LMP 09/08/2020   SpO2 96%   Visual Acuity Right Eye Distance:   Left Eye Distance:   Bilateral Distance:    Right Eye Near:   Left Eye Near:    Bilateral Near:     Physical Exam Vitals and nursing note reviewed.  Constitutional:      Appearance: She is well-developed.     Comments: No acute distress  HENT:     Head: Normocephalic and atraumatic.     Ears:     Comments: Bilateral cerumen impaction Right external auricle with tenderness to palpation to tragus, preauricular and postauricular area, after removal of cerumen, canal appears erythematous and edematous, TM irregular opaque and erythematous as well    Nose: Nose normal.  Eyes:     Conjunctiva/sclera: Conjunctivae normal.  Cardiovascular:     Rate and Rhythm: Normal rate.  Pulmonary:     Effort: Pulmonary effort is normal. No respiratory distress.  Abdominal:     General: There is no distension.  Musculoskeletal:        General: Normal range of motion.      Cervical back: Neck supple.  Skin:    General: Skin is warm and dry.  Neurological:     Mental Status: She is alert and oriented to person, place, and time.      UC Treatments / Results  Labs (all labs ordered are listed, but only abnormal results are displayed) Labs Reviewed - No data to display  EKG   Radiology No results found.  Procedures Procedures (including critical care time)  Medications Ordered in UC Medications - No data to display  Initial Impression / Assessment and Plan / UC Course  I have reviewed the triage vital signs and the nursing notes.  Pertinent labs & imaging results that were available during my care of the patient were reviewed by me and considered in my medical decision making (see chart for details).     We will have patient continue Cortisporin, also starting on oral antibiotics to help with otitis media, provided Debrox to use on left ear to remove cerumen.  Irrigation done by nursing staff, patient tolerated but with pain.  Improvement in hearing after irrigation.  Discussed strict return precautions. Patient verbalized understanding and is agreeable with plan.  Final Clinical Impressions(s) / UC Diagnoses   Final diagnoses:  Non-recurrent acute suppurative otitis media of right ear without spontaneous rupture of tympanic membrane  Bilateral impacted cerumen     Discharge Instructions     Begin Augmentin twice daily for the next 10 days Continue eardrops on right side Tylenol and ibuprofen for pain Debrox to help remove wax on left ear Follow-up if not improving or worsening    ED Prescriptions    Medication Sig Dispense Auth. Provider     amoxicillin-clavulanate (AUGMENTIN) 875-125 MG tablet Take 1 tablet by mouth every 12 (twelve) hours for 10 days. 20 tablet ,  C, PA-C   carbamide peroxide (DEBROX) 6.5 % OTIC solution Place 5 drops into the left ear 2 (two) times daily. 15 mL ,  C, PA-C     PDMP  not reviewed this encounter.   ,  C, PA-C 09/14/20 0815  

## 2020-09-17 ENCOUNTER — Telehealth: Payer: Self-pay | Admitting: General Practice

## 2020-09-17 ENCOUNTER — Other Ambulatory Visit: Payer: Self-pay | Admitting: Internal Medicine

## 2020-09-17 NOTE — Telephone Encounter (Signed)
[facility] [phone] called to say pt needs more refills of Ativan 1 mg. Pharmacy is out of atorvastatin and farxiga so those will need to be sent to another pharmacy.

## 2020-09-23 NOTE — Telephone Encounter (Signed)
Spoke to [place] at the pharmacy and he will call the other pharmacies to have medication Rx transferred for patient.  Atorvastatin and [place]

## 2020-10-14 ENCOUNTER — Other Ambulatory Visit: Payer: Self-pay | Admitting: Internal Medicine

## 2020-10-14 DIAGNOSIS — E1142 Type 2 diabetes mellitus with diabetic polyneuropathy: Secondary | ICD-10-CM

## 2020-10-26 ENCOUNTER — Other Ambulatory Visit: Payer: Self-pay

## 2020-10-26 ENCOUNTER — Ambulatory Visit
Admission: EM | Admit: 2020-10-26 | Discharge: 2020-10-26 | Disposition: A | Discharge status: Home / Self Care | Payer: Medicaid Other | Attending: Emergency Medicine | Admitting: Emergency Medicine

## 2020-10-26 DIAGNOSIS — R197 Diarrhea, unspecified: Secondary | ICD-10-CM

## 2020-10-26 DIAGNOSIS — R112 Nausea with vomiting, unspecified: Secondary | ICD-10-CM

## 2020-10-26 MED ORDER — ONDANSETRON 4 MG PO TBDP: 4.0000 mg | ORAL_TABLET | Freq: Three times a day (TID) | ORAL | 0 refills | Status: DC | PRN

## 2020-10-26 MED ORDER — ONDANSETRON 4 MG PO TBDP
4.0000 mg | ORAL_TABLET | Freq: Once | ORAL | Status: AC
  Administered 2020-10-26: 4 mg via ORAL

## 2020-10-26 NOTE — Discharge Instructions (Addendum)
Zofran dissolved in mouth 1 to 2 tablets every 8 hours for nausea/vomiting; please wait 8 hours until taking an additional dose since we gave you 8 mg before you left Focus on drinking plenty of fluids Rest Go to the emergency room if developing persistent nausea/vomiting, worsening abdominal pain for imaging of abdomen, rule out appendicitis

## 2020-10-26 NOTE — ED Triage Notes (Signed)
Pt c/o n/v/d and cold sweats since 5am.

## 2020-10-27 ENCOUNTER — Emergency Department (HOSPITAL_BASED_OUTPATIENT_CLINIC_OR_DEPARTMENT_OTHER)
Admission: EM | Admit: 2020-10-27 | Discharge: 2020-10-27 | Disposition: A | Discharge status: Home / Self Care | Payer: Medicaid Other | Attending: Emergency Medicine | Admitting: Emergency Medicine

## 2020-10-27 ENCOUNTER — Encounter (HOSPITAL_BASED_OUTPATIENT_CLINIC_OR_DEPARTMENT_OTHER): Payer: Self-pay | Admitting: Emergency Medicine

## 2020-10-27 DIAGNOSIS — A084 Viral intestinal infection, unspecified: Secondary | ICD-10-CM | POA: Diagnosis not present

## 2020-10-27 DIAGNOSIS — Z7984 Long term (current) use of oral hypoglycemic drugs: Secondary | ICD-10-CM | POA: Diagnosis not present

## 2020-10-27 DIAGNOSIS — Z79899 Other long term (current) drug therapy: Secondary | ICD-10-CM | POA: Diagnosis not present

## 2020-10-27 DIAGNOSIS — F17200 Nicotine dependence, unspecified, uncomplicated: Secondary | ICD-10-CM | POA: Insufficient documentation

## 2020-10-27 DIAGNOSIS — I10 Essential (primary) hypertension: Secondary | ICD-10-CM | POA: Insufficient documentation

## 2020-10-27 DIAGNOSIS — E119 Type 2 diabetes mellitus without complications: Secondary | ICD-10-CM | POA: Diagnosis not present

## 2020-10-27 DIAGNOSIS — Z794 Long term (current) use of insulin: Secondary | ICD-10-CM | POA: Diagnosis not present

## 2020-10-27 DIAGNOSIS — E860 Dehydration: Secondary | ICD-10-CM | POA: Diagnosis not present

## 2020-10-27 DIAGNOSIS — R112 Nausea with vomiting, unspecified: Secondary | ICD-10-CM | POA: Diagnosis present

## 2020-10-27 LAB — CBC WITH DIFFERENTIAL/PLATELET
Abs Immature Granulocytes: 0.07 10*3/uL (ref 0.00–0.07)
Basophils Absolute: 0 10*3/uL (ref 0.0–0.1)
Basophils Relative: 0 %
Eosinophils Absolute: 0 10*3/uL (ref 0.0–0.5)
Eosinophils Relative: 0 %
HCT: 36.6 % (ref 36.0–46.0)
Hemoglobin: 11.8 g/dL — ABNORMAL LOW (ref 12.0–15.0)
Immature Granulocytes: 1 %
Lymphocytes Relative: 7 %
Lymphs Abs: 1 10*3/uL (ref 0.7–4.0)
MCH: 24.7 pg — ABNORMAL LOW (ref 26.0–34.0)
MCHC: 32.2 g/dL (ref 30.0–36.0)
MCV: 76.7 fL — ABNORMAL LOW (ref 80.0–100.0)
Monocytes Absolute: 0.7 10*3/uL (ref 0.1–1.0)
Monocytes Relative: 5 %
Neutro Abs: 12.8 10*3/uL — ABNORMAL HIGH (ref 1.7–7.7)
Neutrophils Relative %: 87 %
Platelets: 489 10*3/uL — ABNORMAL HIGH (ref 150–400)
RBC: 4.77 MIL/uL (ref 3.87–5.11)
RDW: 18.8 % — ABNORMAL HIGH (ref 11.5–15.5)
WBC: 14.5 10*3/uL — ABNORMAL HIGH (ref 4.0–10.5)
nRBC: 0 % (ref 0.0–0.2)

## 2020-10-27 LAB — COMPREHENSIVE METABOLIC PANEL
ALT: 25 U/L (ref 0–44)
AST: 26 U/L (ref 15–41)
Albumin: 3.9 g/dL (ref 3.5–5.0)
Alkaline Phosphatase: 68 U/L (ref 38–126)
Anion gap: 14 (ref 5–15)
BUN: 14 mg/dL (ref 6–20)
CO2: 20 mmol/L — ABNORMAL LOW (ref 22–32)
Calcium: 9.8 mg/dL (ref 8.9–10.3)
Chloride: 100 mmol/L (ref 98–111)
Creatinine, Ser: 0.74 mg/dL (ref 0.44–1.00)
GFR, Estimated: >60 mL/min (ref >60)
Glucose, Bld: 248 mg/dL — ABNORMAL HIGH (ref 70–99)
Potassium: 3.9 mmol/L (ref 3.5–5.1)
Sodium: 134 mmol/L — ABNORMAL LOW (ref 135–145)
Total Bilirubin: 1.2 mg/dL (ref 0.3–1.2)
Total Protein: 7.9 g/dL (ref 6.5–8.1)

## 2020-10-27 LAB — URINALYSIS, ROUTINE W REFLEX MICROSCOPIC
Bilirubin Urine: NEGATIVE
Glucose, UA: >=500 mg/dL — AB
Ketones, ur: 15 mg/dL — AB
Leukocytes,Ua: NEGATIVE
Nitrite: NEGATIVE
Protein, ur: 30 mg/dL — AB
Specific Gravity, Urine: >1.03 — ABNORMAL HIGH (ref 1.005–1.030)
pH: 6 (ref 5.0–8.0)

## 2020-10-27 LAB — URINALYSIS, MICROSCOPIC (REFLEX): RBC / HPF: >50 RBC/hpf (ref 0–5)

## 2020-10-27 MED ORDER — LACTATED RINGERS IV BOLUS
2000.0000 mL | Freq: Once | INTRAVENOUS | Status: AC
  Administered 2020-10-27: 2000 mL via INTRAVENOUS

## 2020-10-27 MED ORDER — DIPHENHYDRAMINE HCL 50 MG/ML IJ SOLN
25.0000 mg | Freq: Once | INTRAMUSCULAR | Status: AC
  Administered 2020-10-27: 25 mg via INTRAVENOUS
  Filled 2020-10-27: qty 1

## 2020-10-27 MED ORDER — METOCLOPRAMIDE HCL 10 MG PO TABS: 10.0000 mg | ORAL_TABLET | Freq: Four times a day (QID) | ORAL | 0 refills | Status: DC | PRN

## 2020-10-27 MED ORDER — PANTOPRAZOLE SODIUM 40 MG IV SOLR
40.0000 mg | Freq: Once | INTRAVENOUS | Status: AC
  Administered 2020-10-27: 40 mg via INTRAVENOUS
  Filled 2020-10-27: qty 40

## 2020-10-27 MED ORDER — METOCLOPRAMIDE HCL 5 MG/ML IJ SOLN
10.0000 mg | Freq: Once | INTRAMUSCULAR | Status: AC
  Administered 2020-10-27: 10 mg via INTRAVENOUS
  Filled 2020-10-27: qty 2

## 2020-10-27 NOTE — ED Provider Notes (Signed)
[facility] Provider Note: Georgena Spurling, MD, FACEP  CSN: [id number] MRN: [id number] ARRIVAL: 10/27/20 at [place]: [place]  Vomiting   HISTORY OF PRESENT ILLNESS  10/27/20 3:55 AM [name] is a 45 y.o. female who has had nausea, vomiting and diarrhea for the past 3 days.  Symptoms have been moderate to severe and she feels dehydrated.  She also feels general malaise and has been diaphoretic.  She did have some lower abdominal cramping earlier in the course of the disease but is not sure if this was related to her GI symptoms or to the fact that she is on her menses.  She is not aware of having a fever.  She was seen in urgent care yesterday and prescribed Zofran which has not controlled her vomiting.  She estimates she has vomited 6 times since.   Past Medical History:  Diagnosis Date  . Abnormal Pap smear   . Bipolar 1 disorder ([place])   . Diabetes mellitus without complication ([place])   . Fibroids   . Hypertension   . IBS (irritable bowel syndrome)     Past Surgical History:  Procedure Laterality Date  . CERVICAL BIOPSY    . CHOLECYSTECTOMY    . ESSURE TUBAL LIGATION    . HIATAL HERNIA REPAIR    . TUBAL LIGATION      Family History  Problem Relation Age of Onset  . Diabetes Father   . Diabetes Paternal Grandmother   . Diabetes Maternal Grandmother   . Mental illness Cousin   . Healthy Mother     Social History   Tobacco Use  . Smoking status: Current Some Day Smoker  . Smokeless tobacco: Never Used  Vaping Use  . Vaping Use: Some days  Substance Use Topics  . Alcohol use: Not Currently    Comment: occasional  . Drug use: Not Currently    Types: Marijuana    Prior to Admission medications   Medication Sig Start Date End Date Taking? Authorizing Provider  metoCLOPramide (REGLAN) 10 MG tablet Take 1 tablet (10 mg total) by mouth every 6 (six) hours as needed for nausea or vomiting. 10/27/20  Yes Braden Deloach, MD  Accu-Chek  Softclix Lancets lancets Use to check FSBS BID. Dx: E11.42 03/11/20   [name] Bang, DO  ARIPiprazole (ABILIFY) 10 MG tablet Take 1 tablet (10 mg total) by mouth daily. 08/20/20   Salley Scarlet, MD  atorvastatin (LIPITOR) 80 MG tablet Take 1 tablet (80 mg total) by mouth daily. 03/11/20   [name] Bang, DO  Blood Glucose Monitoring Suppl (ACCU-CHEK GUIDE ME) w/Device KIT Use to check FSBS BID. Dx: E11.42 03/11/20   [name] Bang, DO  carbamide peroxide (DEBROX) 6.5 % OTIC solution Place 5 drops into the left ear 2 (two) times daily. 09/13/20   Wieters, Hallie C, PA-C  dapagliflozin propanediol (FARXIGA) 5 MG TABS tablet Take 1 tablet (5 mg total) by mouth daily before breakfast. 06/23/20   Camillia Herter, NP  divalproex (DEPAKOTE) 250 MG DR tablet Take 1 tablet (250 mg total) by mouth 2 (two) times daily. 08/20/20   Salley Scarlet, MD  FARXIGA 5 MG TABS tablet Take 5 mg by mouth every morning. 06/23/20   [provider]  ibuprofen (ADVIL) 800 MG tablet Take 1 tablet (800 mg total) by mouth 3 (three) times daily with meals. 09/11/20   Vanessa Kick, MD  Insulin Pen Needle (TRUEPLUS PEN NEEDLES) 32G X  4 MM MISC Use as instructed to inject Victoza daily. 03/11/20   Wallace, Catherine Lauren, DO  lisinopril (ZESTRIL) 5 MG tablet Take 1 tablet (5 mg total) by mouth daily. 03/11/20   Wallace, Catherine Lauren, DO  LORazepam (ATIVAN) 1 MG tablet Take 1 tablet (1 mg total) by mouth 3 (three) times daily. 08/20/20   Freeman, Megan M, MD  metFORMIN (GLUCOPHAGE) 1000 MG tablet Take 1 tablet (1,000 mg total) by mouth 2 (two) times daily with a meal. 06/23/20   Stephens, Amy J, NP  neomycin-polymyxin-hydrocortisone (CORTISPORIN) 3.5-10000-1 OTIC suspension Place 4 drops into the right ear 3 (three) times daily. 09/11/20   Hagler, Brian, MD  ondansetron (ZOFRAN ODT) 4 MG disintegrating tablet Take 1-2 tablets (4-8 mg total) by mouth every 8 (eight) hours as needed for nausea or  vomiting. 10/26/20   Wieters, Hallie C, PA-C  VICTOZA 18 MG/3ML SOPN INJECT 1.8 MG UNDER THE SKIN EVERY DAY 10/14/20   Stephens, Amy J, NP    Allergies Patient has no known allergies.   REVIEW OF SYSTEMS  Negative except as noted here or in the History of Present Illness.   PHYSICAL EXAMINATION  Initial Vital Signs Blood pressure (!) 136/102, pulse 98, temperature 99 F (37.2 C), temperature source Oral, resp. rate 20, height 6' (1.829 m), weight 113.4 kg, last menstrual period 10/25/2020, SpO2 100 %.  Examination General: Well-developed, well-nourished female in no acute distress; appearance consistent with age of record HENT: normocephalic; atraumatic Eyes: pupils equal, round and reactive to light; extraocular muscles intact Neck: supple Heart: regular rate and rhythm Lungs: clear to auscultation bilaterally Abdomen: soft; nondistended; mild diffuse tenderness; bowel sounds present Extremities: No deformity; full range of motion; pulses normal Neurologic: Awake, alert and oriented; motor function intact in all extremities and symmetric; no facial droop Skin: Warm and dry Psychiatric: Tearful   RESULTS  Summary of this visit's results, reviewed and interpreted by myself:   EKG Interpretation  Date/Time:    Ventricular Rate:    PR Interval:    QRS Duration:   QT Interval:    QTC Calculation:   R Axis:     Text Interpretation:        Laboratory Studies: Results for orders placed or performed during the hospital encounter of 10/27/20 (from the past 24 hour(s))  CBC with Differential/Platelet     Status: Abnormal   Collection Time: 10/27/20  4:04 AM  Result Value Ref Range   WBC 14.5 (H) 4.0 - 10.5 K/uL   RBC 4.77 3.87 - 5.11 MIL/uL   Hemoglobin 11.8 (L) 12.0 - 15.0 g/dL   HCT 36.6 36.0 - 46.0 %   MCV 76.7 (L) 80.0 - 100.0 fL   MCH 24.7 (L) 26.0 - 34.0 pg   MCHC 32.2 30.0 - 36.0 g/dL   RDW 18.8 (H) 11.5 - 15.5 %   Platelets 489 (H) 150 - 400 K/uL   nRBC 0.0  0.0 - 0.2 %   Neutrophils Relative % 87 %   Neutro Abs 12.8 (H) 1.7 - 7.7 K/uL   Lymphocytes Relative 7 %   Lymphs Abs 1.0 0.7 - 4.0 K/uL   Monocytes Relative 5 %   Monocytes Absolute 0.7 0.1 - 1.0 K/uL   Eosinophils Relative 0 %   Eosinophils Absolute 0.0 0.0 - 0.5 K/uL   Basophils Relative 0 %   Basophils Absolute 0.0 0.0 - 0.1 K/uL   Immature Granulocytes 1 %   Abs Immature Granulocytes 0.07 0.00 - 0.07 K/uL    Comprehensive metabolic panel     Status: Abnormal   Collection Time: 10/27/20  4:04 AM  Result Value Ref Range   Sodium 134 (L) 135 - 145 mmol/L   Potassium 3.9 3.5 - 5.1 mmol/L   Chloride 100 98 - 111 mmol/L   CO2 20 (L) 22 - 32 mmol/L   Glucose, Bld 248 (H) 70 - 99 mg/dL   BUN 14 6 - 20 mg/dL   Creatinine, Ser 0.74 0.44 - 1.00 mg/dL   Calcium 9.8 8.9 - 10.3 mg/dL   Total Protein 7.9 6.5 - 8.1 g/dL   Albumin 3.9 3.5 - 5.0 g/dL   AST 26 15 - 41 U/L   ALT 25 0 - 44 U/L   Alkaline Phosphatase 68 38 - 126 U/L   Total Bilirubin 1.2 0.3 - 1.2 mg/dL   GFR, Estimated >60 >60 mL/min   Anion gap 14 5 - 15  Urinalysis, Routine w reflex microscopic     Status: Abnormal   Collection Time: 10/27/20  5:46 AM  Result Value Ref Range   Color, Urine YELLOW YELLOW   APPearance CLOUDY (A) CLEAR   Specific Gravity, Urine >1.030 (H) 1.005 - 1.030   pH 6.0 5.0 - 8.0   Glucose, UA >=500 (A) NEGATIVE mg/dL   Hgb urine dipstick LARGE (A) NEGATIVE   Bilirubin Urine NEGATIVE NEGATIVE   Ketones, ur 15 (A) NEGATIVE mg/dL   Protein, ur 30 (A) NEGATIVE mg/dL   Nitrite NEGATIVE NEGATIVE   Leukocytes,Ua NEGATIVE NEGATIVE  Urinalysis, Microscopic (reflex)     Status: Abnormal   Collection Time: 10/27/20  5:46 AM  Result Value Ref Range   RBC / HPF >50 0 - 5 RBC/hpf   WBC, UA 0-5 0 - 5 WBC/hpf   Bacteria, UA RARE (A) NONE SEEN   Squamous Epithelial / LPF 0-5 0 - 5   Imaging Studies: No results found.  ED COURSE and MDM  Nursing notes, initial and subsequent vitals signs, including  pulse oximetry, reviewed and interpreted by myself.  Vitals:   10/27/20 0347 10/27/20 0350 10/27/20 0549  BP:  (!) 136/102 (!) 166/93  Pulse:  98 80  Resp:  20 19  Temp:  99 F (37.2 C)   TempSrc:  Oral   SpO2:  100% 96%  Weight: 113.4 kg    Height: 6' (1.829 m)     Medications  lactated ringers bolus 2,000 mL (0 mLs Intravenous Stopped 10/27/20 0615)  diphenhydrAMINE (BENADRYL) injection 25 mg (25 mg Intravenous Given 10/27/20 0407)  metoCLOPramide (REGLAN) injection 10 mg (10 mg Intravenous Given 10/27/20 0407)  pantoprazole (PROTONIX) injection 40 mg (40 mg Intravenous Given 10/27/20 0407)   6:15 AM Patient feeling better after IV fluids and medication.  She was able to drink fluids without vomiting.  She was advised she can use over the counter Imodium as needed for diarrhea.  Presentation consistent with viral gastroenteritis.   PROCEDURES  Procedures   ED DIAGNOSES     ICD-10-CM   1. Viral gastroenteritis  A08.4   2. Dehydration  E86.0        Jessikah Dicker, Jenny Reichmann, MD 10/27/20 [id number]

## 2020-10-27 NOTE — ED Triage Notes (Signed)
Pt c/o nausea, vomiting, and diarrhea x 3 days  Pt went to urgent care yesterday and was given medication for nausea without relief  Pt states she has had 6 episodes of vomiting since   Pt states she was having right lower quadrant pain but that has gone away  Pt is sweaty upon arrival

## 2020-10-28 ENCOUNTER — Emergency Department (HOSPITAL_BASED_OUTPATIENT_CLINIC_OR_DEPARTMENT_OTHER): Payer: Medicaid Other

## 2020-10-28 ENCOUNTER — Encounter (HOSPITAL_BASED_OUTPATIENT_CLINIC_OR_DEPARTMENT_OTHER): Payer: Self-pay

## 2020-10-28 ENCOUNTER — Emergency Department (HOSPITAL_BASED_OUTPATIENT_CLINIC_OR_DEPARTMENT_OTHER)
Admission: EM | Admit: 2020-10-28 | Discharge: 2020-10-28 | Disposition: A | Discharge status: Home / Self Care | Payer: Medicaid Other | Attending: Emergency Medicine | Admitting: Emergency Medicine

## 2020-10-28 ENCOUNTER — Other Ambulatory Visit: Payer: Self-pay

## 2020-10-28 DIAGNOSIS — E119 Type 2 diabetes mellitus without complications: Secondary | ICD-10-CM | POA: Insufficient documentation

## 2020-10-28 DIAGNOSIS — F17200 Nicotine dependence, unspecified, uncomplicated: Secondary | ICD-10-CM | POA: Insufficient documentation

## 2020-10-28 DIAGNOSIS — Z7984 Long term (current) use of oral hypoglycemic drugs: Secondary | ICD-10-CM | POA: Insufficient documentation

## 2020-10-28 DIAGNOSIS — I10 Essential (primary) hypertension: Secondary | ICD-10-CM | POA: Insufficient documentation

## 2020-10-28 DIAGNOSIS — R1013 Epigastric pain: Secondary | ICD-10-CM | POA: Diagnosis not present

## 2020-10-28 DIAGNOSIS — Z79899 Other long term (current) drug therapy: Secondary | ICD-10-CM | POA: Diagnosis not present

## 2020-10-28 DIAGNOSIS — R112 Nausea with vomiting, unspecified: Secondary | ICD-10-CM | POA: Diagnosis not present

## 2020-10-28 DIAGNOSIS — R1032 Left lower quadrant pain: Secondary | ICD-10-CM | POA: Insufficient documentation

## 2020-10-28 DIAGNOSIS — E871 Hypo-osmolality and hyponatremia: Secondary | ICD-10-CM | POA: Insufficient documentation

## 2020-10-28 DIAGNOSIS — K5900 Constipation, unspecified: Secondary | ICD-10-CM | POA: Insufficient documentation

## 2020-10-28 DIAGNOSIS — R1012 Left upper quadrant pain: Secondary | ICD-10-CM | POA: Diagnosis not present

## 2020-10-28 DIAGNOSIS — R5383 Other fatigue: Secondary | ICD-10-CM | POA: Insufficient documentation

## 2020-10-28 DIAGNOSIS — R109 Unspecified abdominal pain: Secondary | ICD-10-CM

## 2020-10-28 DIAGNOSIS — Z794 Long term (current) use of insulin: Secondary | ICD-10-CM | POA: Diagnosis not present

## 2020-10-28 LAB — URINALYSIS, ROUTINE W REFLEX MICROSCOPIC
Glucose, UA: NEGATIVE mg/dL
Ketones, ur: NEGATIVE mg/dL
Leukocytes,Ua: NEGATIVE
Nitrite: NEGATIVE
Protein, ur: 30 mg/dL — AB
Specific Gravity, Urine: 1.025 (ref 1.005–1.030)
pH: 6.5 (ref 5.0–8.0)

## 2020-10-28 LAB — COMPREHENSIVE METABOLIC PANEL
ALT: 25 U/L (ref 0–44)
AST: 23 U/L (ref 15–41)
Albumin: 3.8 g/dL (ref 3.5–5.0)
Alkaline Phosphatase: 63 U/L (ref 38–126)
Anion gap: 11 (ref 5–15)
BUN: 14 mg/dL (ref 6–20)
CO2: 22 mmol/L (ref 22–32)
Calcium: 9.3 mg/dL (ref 8.9–10.3)
Chloride: 96 mmol/L — ABNORMAL LOW (ref 98–111)
Creatinine, Ser: 0.78 mg/dL (ref 0.44–1.00)
GFR, Estimated: >60 mL/min (ref >60)
Glucose, Bld: 193 mg/dL — ABNORMAL HIGH (ref 70–99)
Potassium: 3.7 mmol/L (ref 3.5–5.1)
Sodium: 129 mmol/L — ABNORMAL LOW (ref 135–145)
Total Bilirubin: 1.1 mg/dL (ref 0.3–1.2)
Total Protein: 7.6 g/dL (ref 6.5–8.1)

## 2020-10-28 LAB — CBC WITH DIFFERENTIAL/PLATELET
Abs Immature Granulocytes: 0.05 10*3/uL (ref 0.00–0.07)
Basophils Absolute: 0 10*3/uL (ref 0.0–0.1)
Basophils Relative: 0 %
Eosinophils Absolute: 0 10*3/uL (ref 0.0–0.5)
Eosinophils Relative: 0 %
HCT: 36.9 % (ref 36.0–46.0)
Hemoglobin: 11.9 g/dL — ABNORMAL LOW (ref 12.0–15.0)
Immature Granulocytes: 0 %
Lymphocytes Relative: 15 %
Lymphs Abs: 1.8 10*3/uL (ref 0.7–4.0)
MCH: 24.9 pg — ABNORMAL LOW (ref 26.0–34.0)
MCHC: 32.2 g/dL (ref 30.0–36.0)
MCV: 77.4 fL — ABNORMAL LOW (ref 80.0–100.0)
Monocytes Absolute: 0.7 10*3/uL (ref 0.1–1.0)
Monocytes Relative: 6 %
Neutro Abs: 9 10*3/uL — ABNORMAL HIGH (ref 1.7–7.7)
Neutrophils Relative %: 79 %
Platelets: 470 10*3/uL — ABNORMAL HIGH (ref 150–400)
RBC: 4.77 MIL/uL (ref 3.87–5.11)
RDW: 18.7 % — ABNORMAL HIGH (ref 11.5–15.5)
WBC: 11.6 10*3/uL — ABNORMAL HIGH (ref 4.0–10.5)
nRBC: 0 % (ref 0.0–0.2)

## 2020-10-28 LAB — PREGNANCY, URINE: Preg Test, Ur: NEGATIVE

## 2020-10-28 LAB — URINALYSIS, MICROSCOPIC (REFLEX): RBC / HPF: >50 RBC/hpf (ref 0–5)

## 2020-10-28 LAB — LIPASE, BLOOD: Lipase: 28 U/L (ref 11–51)

## 2020-10-28 LAB — LACTIC ACID, PLASMA: Lactic Acid, Venous: 1.8 mmol/L (ref 0.5–1.9)

## 2020-10-28 MED ORDER — PROMETHAZINE HCL 25 MG RE SUPP: 25.0000 mg | Freq: Four times a day (QID) | RECTAL | 0 refills | Status: DC | PRN

## 2020-10-28 MED ORDER — SODIUM CHLORIDE 0.9 % IV BOLUS
1000.0000 mL | Freq: Once | INTRAVENOUS | Status: AC
  Administered 2020-10-28: 1000 mL via INTRAVENOUS

## 2020-10-28 MED ORDER — PROMETHAZINE HCL 25 MG/ML IJ SOLN
INTRAMUSCULAR | Status: AC
  Filled 2020-10-28: qty 1

## 2020-10-28 MED ORDER — MORPHINE SULFATE (PF) 4 MG/ML IV SOLN
4.0000 mg | Freq: Once | INTRAVENOUS | Status: AC
  Administered 2020-10-28: 4 mg via INTRAVENOUS
  Filled 2020-10-28: qty 1

## 2020-10-28 MED ORDER — IOHEXOL 300 MG/ML  SOLN
100.0000 mL | Freq: Once | INTRAMUSCULAR | Status: AC | PRN
  Administered 2020-10-28: 100 mL via INTRAVENOUS

## 2020-10-28 MED ORDER — SODIUM CHLORIDE 0.9 % IV SOLN
25.0000 mg | Freq: Four times a day (QID) | INTRAVENOUS | Status: DC | PRN
  Administered 2020-10-28: 25 mg via INTRAVENOUS
  Filled 2020-10-28: qty 1

## 2020-10-28 MED ORDER — ONDANSETRON HCL 4 MG/2ML IJ SOLN
4.0000 mg | Freq: Once | INTRAMUSCULAR | Status: AC
  Administered 2020-10-28: 4 mg via INTRAVENOUS
  Filled 2020-10-28: qty 2

## 2020-10-28 NOTE — ED Triage Notes (Signed)
Pt reports N/V/D and cold sweats staring Sunday. Was recently here yesterday for same but states it is getting worse. + 9/10 Abd pain starting after the vomiting. Last episode of vomitus 30 mins PTA

## 2020-10-28 NOTE — ED Provider Notes (Signed)
[facility] Provider Note   CSN: [id number] Arrival date & time: 10/28/20  3748     History Chief Complaint  Patient presents with  . Emesis  . Diarrhea  . Chills    [name] is a 45 y.o. female.  The history is provided by the patient and medical records. No language interpreter was used.  Abdominal Pain Pain location:  LUQ, LLQ and epigastric Pain quality: aching   Pain radiates to:  Does not radiate Pain severity:  Severe Onset quality:  Gradual Duration:  2 days Timing:  Constant Progression:  Waxing and waning Chronicity:  New Context: not trauma   Relieved by:  Nothing Worsened by:  Nothing Ineffective treatments:  None tried Associated symptoms: constipation, fatigue, nausea and vomiting   Associated symptoms: no chest pain, no chills, no cough, no diarrhea, no dysuria, no fever and no shortness of breath        Past Medical History:  Diagnosis Date  . Abnormal Pap smear   . Bipolar 1 disorder ([place])   . Diabetes mellitus without complication ([place])   . Fibroids   . Hypertension   . IBS (irritable bowel syndrome)     Patient Active Problem List   Diagnosis Date Noted  . Essential hypertension 04/08/2019  . Class 2 severe obesity due to excess calories with serious comorbidity and body mass index (BMI) of 39.0 to 39.9 in adult ([place]) 04/08/2019  . Tobacco dependence 04/08/2019  . Schizoaffective disorder, bipolar type ([place]) 05/18/2018  . Diabetes mellitus ([place]) 08/10/2016  . Bipolar disorder, curr episode mixed, severe, with psychotic features ([place]) 08/07/2016  . Cannabis use disorder, moderate, dependence ([place]) 08/07/2016  . CHOLECYSTITIS, UNSPECIFIED 06/12/2008  . BILIARY COLIC 27/12/8673  . CERUMEN IMPACTION, BILATERAL 01/17/2008  . PHARYNGITIS, VIRAL 01/17/2008  . NECK PAIN 01/17/2008    Past Surgical History:  Procedure Laterality Date  . CERVICAL BIOPSY    . CHOLECYSTECTOMY    . ESSURE TUBAL LIGATION    .  HIATAL HERNIA REPAIR    . TUBAL LIGATION       OB History    Gravida  7   Para  2   Term  2   Preterm  0   AB  5   Living        SAB  0   IAB  4   Ectopic  1   Multiple      Live Births              Family History  Problem Relation Age of Onset  . Diabetes Father   . Diabetes Paternal Grandmother   . Diabetes Maternal Grandmother   . Mental illness Cousin   . Healthy Mother     Social History   Tobacco Use  . Smoking status: Current Some Day Smoker  . Smokeless tobacco: Never Used  Vaping Use  . Vaping Use: Some days  Substance Use Topics  . Alcohol use: Not Currently    Comment: occasional  . Drug use: Not Currently    Types: Marijuana    Home Medications Prior to Admission medications   Medication Sig Start Date End Date Taking? Authorizing Provider  Accu-Chek Softclix Lancets lancets Use to check FSBS BID. Dx: E11.42 03/11/20   [name] Bang, DO  ARIPiprazole (ABILIFY) 10 MG tablet Take 1 tablet (10 mg total) by mouth daily. 08/20/20   Salley Scarlet, MD  atorvastatin (LIPITOR) 80 MG tablet Take 1 tablet (80  mg total) by mouth daily. 03/11/20   [name] Bang, DO  Blood Glucose Monitoring Suppl (ACCU-CHEK GUIDE ME) w/Device KIT Use to check FSBS BID. Dx: E11.42 03/11/20   [name] Bang, DO  carbamide peroxide (DEBROX) 6.5 % OTIC solution Place 5 drops into the left ear 2 (two) times daily. 09/13/20   Wieters, Hallie C, PA-C  dapagliflozin propanediol (FARXIGA) 5 MG TABS tablet Take 1 tablet (5 mg total) by mouth daily before breakfast. 06/23/20   Camillia Herter, NP  divalproex (DEPAKOTE) 250 MG DR tablet Take 1 tablet (250 mg total) by mouth 2 (two) times daily. 08/20/20   Salley Scarlet, MD  FARXIGA 5 MG TABS tablet Take 5 mg by mouth every morning. 06/23/20   [provider]  ibuprofen (ADVIL) 800 MG tablet Take 1 tablet (800 mg total) by mouth 3 (three) times daily with meals. 09/11/20   Vanessa Kick, MD   Insulin Pen Needle (TRUEPLUS PEN NEEDLES) 32G X 4 MM MISC Use as instructed to inject Victoza daily. 03/11/20   [name] Bang, DO  lisinopril (ZESTRIL) 5 MG tablet Take 1 tablet (5 mg total) by mouth daily. 03/11/20   [name] Bang, DO  LORazepam (ATIVAN) 1 MG tablet Take 1 tablet (1 mg total) by mouth 3 (three) times daily. 08/20/20   Salley Scarlet, MD  metFORMIN (GLUCOPHAGE) 1000 MG tablet Take 1 tablet (1,000 mg total) by mouth 2 (two) times daily with a meal. 06/23/20   Camillia Herter, NP  metoCLOPramide (REGLAN) 10 MG tablet Take 1 tablet (10 mg total) by mouth every 6 (six) hours as needed for nausea or vomiting. 10/27/20   Molpus, Jenny Reichmann, MD  neomycin-polymyxin-hydrocortisone (CORTISPORIN) 3.5-10000-1 OTIC suspension Place 4 drops into the right ear 3 (three) times daily. 09/11/20   Vanessa Kick, MD  ondansetron (ZOFRAN ODT) 4 MG disintegrating tablet Take 1-2 tablets (4-8 mg total) by mouth every 8 (eight) hours as needed for nausea or vomiting. 10/26/20   Wieters, Madelynn Done C, PA-C  VICTOZA 18 MG/3ML SOPN INJECT 1.8 MG UNDER THE SKIN EVERY DAY 10/14/20   Camillia Herter, NP    Allergies    Patient has no known allergies.  Review of Systems   Review of Systems  Constitutional: Positive for fatigue. Negative for chills, diaphoresis and fever.  HENT: Negative for congestion.   Respiratory: Negative for cough, chest tightness, shortness of breath and wheezing.   Cardiovascular: Negative for chest pain and palpitations.  Gastrointestinal: Positive for abdominal pain, constipation, nausea and vomiting. Negative for diarrhea.  Genitourinary: Negative for dysuria, flank pain and frequency.  Musculoskeletal: Negative for back pain, neck pain and neck stiffness.  Skin: Negative for rash and wound.  Neurological: Negative for dizziness and headaches.  Psychiatric/Behavioral: Negative for agitation and confusion.  All other systems reviewed and are negative.   Physical  Exam Updated Vital Signs BP (!) 151/98   Pulse 75   Temp 98.3 F (36.8 C) (Oral)   Resp 18   Ht 6' (1.829 m)   Wt 113.4 kg   LMP 10/25/2020   SpO2 100%   BMI 33.91 kg/m   Physical Exam Vitals and nursing note reviewed.  Constitutional:      General: She is not in acute distress.    Appearance: She is well-developed. She is not ill-appearing, toxic-appearing or diaphoretic.  HENT:     Head: Normocephalic and atraumatic.     Nose: No congestion or rhinorrhea.     Mouth/Throat:  Mouth: Mucous membranes are dry.  Eyes:     Extraocular Movements: Extraocular movements intact.     Conjunctiva/sclera: Conjunctivae normal.     Pupils: Pupils are equal, round, and reactive to light.  Cardiovascular:     Rate and Rhythm: Normal rate and regular rhythm.     Heart sounds: No murmur heard.   Pulmonary:     Effort: Pulmonary effort is normal. No respiratory distress.     Breath sounds: Normal breath sounds. No wheezing, rhonchi or rales.  Chest:     Chest wall: No tenderness.  Abdominal:     General: Abdomen is flat.     Palpations: Abdomen is soft.     Tenderness: There is no abdominal tenderness. There is no right CVA tenderness, left CVA tenderness, guarding or rebound.  Musculoskeletal:        General: No tenderness.     Cervical back: Neck supple.     Right lower leg: No edema.     Left lower leg: No edema.  Skin:    General: Skin is warm and dry.     Capillary Refill: Capillary refill takes less than 2 seconds.     Findings: No erythema.  Neurological:     General: No focal deficit present.     Mental Status: She is alert.  Psychiatric:        Mood and Affect: Mood normal.     ED Results / Procedures / Treatments   Labs (all labs ordered are listed, but only abnormal results are displayed) Labs Reviewed  CBC WITH DIFFERENTIAL/PLATELET - Abnormal; Notable for the following components:      Result Value   WBC 11.6 (*)    Hemoglobin 11.9 (*)    MCV 77.4 (*)     MCH 24.9 (*)    RDW 18.7 (*)    Platelets 470 (*)    Neutro Abs 9.0 (*)    All other components within normal limits  COMPREHENSIVE METABOLIC PANEL - Abnormal; Notable for the following components:   Sodium 129 (*)    Chloride 96 (*)    Glucose, Bld 193 (*)    All other components within normal limits  URINALYSIS, ROUTINE W REFLEX MICROSCOPIC - Abnormal; Notable for the following components:   APPearance CLOUDY (*)    Hgb urine dipstick LARGE (*)    Bilirubin Urine SMALL (*)    Protein, ur 30 (*)    All other components within normal limits  URINALYSIS, MICROSCOPIC (REFLEX) - Abnormal; Notable for the following components:   Bacteria, UA RARE (*)    All other components within normal limits  URINE CULTURE  LACTIC ACID, PLASMA  LIPASE, BLOOD  PREGNANCY, URINE  LACTIC ACID, PLASMA    EKG None  Radiology CT ABDOMEN PELVIS W CONTRAST  Result Date: 10/28/2020 CLINICAL DATA:  Abdominal pain, nausea and vomiting. Bowel obstruction suspected. EXAM: CT ABDOMEN AND PELVIS WITH CONTRAST TECHNIQUE: Multidetector CT imaging of the abdomen and pelvis was performed using the standard protocol following bolus administration of intravenous contrast. CONTRAST:  168m OMNIPAQUE IOHEXOL 300 MG/ML  SOLN COMPARISON:  07/03/2020 FINDINGS: Lower chest: Normal Hepatobiliary: Previous cholecystectomy. Mild fatty change of the liver. Few benign calcifications in the right lobe of the liver. No other focal finding. Pancreas: Normal Spleen: Normal Adrenals/Urinary Tract: Adrenal glands are normal. Kidneys are normal. Bladder is normal. Stomach/Bowel: Stomach is normal. Small bowel is normal. Appendix is normal. Colon is normal except for diverticulosis of the left colon without imaging  evidence of diverticulitis. Vascular/Lymphatic: No abnormal vascular finding. Reproductive: Normal Other: Periumbilical hernia containing only fat. Musculoskeletal: Normal. IMPRESSION: 1. No acute finding. No evidence of bowel  obstruction. Mild fatty change of the liver. Previous cholecystectomy. 2. Periumbilical hernia containing only fat. 3. Diverticulosis of the left colon without imaging evidence of diverticulitis. Electronically Signed   By: Nelson Chimes M.D.   On: 10/28/2020 12:02    Procedures Procedures   Medications Ordered in ED Medications  promethazine (PHENERGAN) 25 mg in sodium chloride 0.9 % 50 mL IVPB (25 mg Intravenous New Bag/Given 10/28/20 1517)  promethazine (PHENERGAN) 25 MG/ML injection (  Not Given 10/28/20 1521)  sodium chloride 0.9 % bolus 1,000 mL (0 mLs Intravenous Stopped 10/28/20 1358)  morphine 4 MG/ML injection 4 mg (4 mg Intravenous Given 10/28/20 1131)  ondansetron (ZOFRAN) injection 4 mg (4 mg Intravenous Given 10/28/20 1132)  iohexol (OMNIPAQUE) 300 MG/ML solution 100 mL (100 mLs Intravenous Contrast Given 10/28/20 1141)    ED Course  I have reviewed the triage vital signs and the nursing notes.  Pertinent labs & imaging results that were available during my care of the patient were reviewed by me and considered in my medical decision making (see chart for details).    MDM Rules/Calculators/A&P                          [name] is a 45 y.o. female with a past medical history significant for prior cholecystectomy, diabetes, hypertension, obesity, bipolar disorder and sees affective disorder, and recent ED visit yesterday morning for nausea and vomiting who presents with persistent nausea, vomiting, and now worsened abdominal pain.  Patient reports that since Sunday she has had nausea, vomiting, and abdominal aching.  She was seen yesterday and had overall reassuring work-up and was feeling better.  She was discharged home with nausea medicine but reports since leaving, she has had consistent nausea and vomiting and is now having more abdominal pain.  She reports she has not had any bowel movements or passed gas over the last few days and is concerned about that.  She denies any  urinary changes.  Denies any fevers, chills, chest pain, shortness of breath.  Denies any trauma.  Reports the pain is 10 out of 10 on arrival here.  She reports the pain is primary in her left abdomen.  She denies any history of diverticulitis.   On exam, abdomen is tender to palpation.  Bowel sounds were appreciated.  Lungs clear and chest nontender.  No focal neurologic deficits.  Patient is warm to touch although she is afebrile here.  Clinically I think we do need to rule out bowel obstruction given her report of no flatus or bowel movements with his nausea vomiting abdominal pain worsening.  We will give her pain medicine, nausea medicine, fluids for rehydration, and she will be n.p.o.  We will get screening labs as well.  Anticipate reassessment after work-up to determine disposition.  CT scan shows no acute obstruction or other acute abnormality.  It does show some dehydration with hyponatremia and hypochloremia.  She was given fluids.  Other labs similar to prior.  Clinically I do suspect she still has a gastritis or gastroenteritis as the previous team suspected.  The Zofran did not significantly help her so we will try Phenergan instead.  After Phenergan, she will be p.o. challenged.  Patient just received the Phenergan she reports her nausea starting to improve.  After  p.o. challenge, she will be discharged home with prescription for.  Phenergan suppository and follow-up with PCP.  She agrees with plan of care.  Care transferred to oncoming team while awaiting reassessment after p.o. challenge.   Final Clinical Impression(s) / ED Diagnoses Final diagnoses:  Non-intractable vomiting with nausea, unspecified vomiting type  Abdominal pain, unspecified abdominal location    Rx / DC Orders ED Discharge Orders         Ordered    promethazine (PHENERGAN) 25 MG suppository  Every 6 hours PRN        10/28/20 1550          Clinical Impression: 1. Non-intractable vomiting with  nausea, unspecified vomiting type   2. Abdominal pain, unspecified abdominal location     Disposition: Care transferred to oncoming team while awaiting reassessment after p.o. challenge.  This note was prepared with assistance of Systems analyst. Occasional wrong-word or sound-a-like substitutions may have occurred due to the inherent limitations of voice recognition software.     Meredeth Furber, Gwenyth Allegra, MD 10/28/20 [phone]

## 2020-10-28 NOTE — ED Provider Notes (Signed)
Patient was able to eat some crackers.  No further vomiting.  Patient stable for discharge home.  Did have some hyponatremia but did receive fluids here.  Patient will be discharged home with antiemetics.  Will return for any new or worse symptoms   Fredia Sorrow, MD 10/28/20 1702

## 2020-10-28 NOTE — ED Notes (Signed)
Patient verbalizes understanding of discharge instructions. Opportunity for questioning and answers were provided. Armband removed by staff, pt discharged from ED.  

## 2020-10-28 NOTE — ED Provider Notes (Signed)
[facility]    CSN: [id number] Arrival date & time: 10/26/20  1632      History   Chief Complaint Chief Complaint  Patient presents with  . Emesis  . Diarrhea    HPI [name] is a 45 y.o. female history of hypertension, DM type II, presenting today for evaluation of nausea vomiting diarrhea.  Symptoms began around 5 AM this morning.  Has had associated hot and cold chills.  Denies associated URI symptoms.  Has been very uncomfortable.  Denies any significant abdominal pain, more uneasiness/nauseousness.  Unable to keep any fluids down.  Denies history of GI problems.  Denies close sick contacts.  HPI  Past Medical History:  Diagnosis Date  . Abnormal Pap smear   . Bipolar 1 disorder ([place])   . Diabetes mellitus without complication ([place])   . Fibroids   . Hypertension   . IBS (irritable bowel syndrome)     Patient Active Problem List   Diagnosis Date Noted  . Essential hypertension 04/08/2019  . Class 2 severe obesity due to excess calories with serious comorbidity and body mass index (BMI) of 39.0 to 39.9 in adult ([place]) 04/08/2019  . Tobacco dependence 04/08/2019  . Schizoaffective disorder, bipolar type ([place]) 05/18/2018  . Diabetes mellitus ([place]) 08/10/2016  . Bipolar disorder, curr episode mixed, severe, with psychotic features ([place]) 08/07/2016  . Cannabis use disorder, moderate, dependence ([place]) 08/07/2016  . CHOLECYSTITIS, UNSPECIFIED 06/12/2008  . BILIARY COLIC 47/03/6282  . CERUMEN IMPACTION, BILATERAL 01/17/2008  . PHARYNGITIS, VIRAL 01/17/2008  . NECK PAIN 01/17/2008    Past Surgical History:  Procedure Laterality Date  . CERVICAL BIOPSY    . CHOLECYSTECTOMY    . ESSURE TUBAL LIGATION    . HIATAL HERNIA REPAIR    . TUBAL LIGATION      OB History    Gravida  7   Para  2   Term  2   Preterm  0   AB  5   Living        SAB  0   IAB  4   Ectopic  1   Multiple      Live Births               Home Medications     Prior to Admission medications   Medication Sig Start Date End Date Taking? Authorizing Provider  ondansetron (ZOFRAN ODT) 4 MG disintegrating tablet Take 1-2 tablets (4-8 mg total) by mouth every 8 (eight) hours as needed for nausea or vomiting. 10/26/20  Yes Tahja Liao C, PA-C  Accu-Chek Softclix Lancets lancets Use to check FSBS BID. Dx: E11.42 03/11/20   [name] Bang, DO  ARIPiprazole (ABILIFY) 10 MG tablet Take 1 tablet (10 mg total) by mouth daily. 08/20/20   Salley Scarlet, MD  atorvastatin (LIPITOR) 80 MG tablet Take 1 tablet (80 mg total) by mouth daily. 03/11/20   [name] Bang, DO  Blood Glucose Monitoring Suppl (ACCU-CHEK GUIDE ME) w/Device KIT Use to check FSBS BID. Dx: E11.42 03/11/20   [name] Bang, DO  carbamide peroxide (DEBROX) 6.5 % OTIC solution Place 5 drops into the left ear 2 (two) times daily. 09/13/20   Wynn Kernes C, PA-C  dapagliflozin propanediol (FARXIGA) 5 MG TABS tablet Take 1 tablet (5 mg total) by mouth daily before breakfast. 06/23/20   Camillia Herter, NP  divalproex (DEPAKOTE) 250 MG DR tablet Take 1 tablet (250 mg total) by mouth 2 (two) times daily.  08/20/20   Salley Scarlet, MD  FARXIGA 5 MG TABS tablet Take 5 mg by mouth every morning. 06/23/20   [provider]  ibuprofen (ADVIL) 800 MG tablet Take 1 tablet (800 mg total) by mouth 3 (three) times daily with meals. 09/11/20   Vanessa Kick, MD  Insulin Pen Needle (TRUEPLUS PEN NEEDLES) 32G X 4 MM MISC Use as instructed to inject Victoza daily. 03/11/20   [name] Bang, DO  lisinopril (ZESTRIL) 5 MG tablet Take 1 tablet (5 mg total) by mouth daily. 03/11/20   [name] Bang, DO  LORazepam (ATIVAN) 1 MG tablet Take 1 tablet (1 mg total) by mouth 3 (three) times daily. 08/20/20   Salley Scarlet, MD  metFORMIN (GLUCOPHAGE) 1000 MG tablet Take 1 tablet (1,000 mg total) by mouth 2 (two) times daily with a meal. 06/23/20   Camillia Herter, NP   metoCLOPramide (REGLAN) 10 MG tablet Take 1 tablet (10 mg total) by mouth every 6 (six) hours as needed for nausea or vomiting. 10/27/20   Molpus, Jenny Reichmann, MD  neomycin-polymyxin-hydrocortisone (CORTISPORIN) 3.5-10000-1 OTIC suspension Place 4 drops into the right ear 3 (three) times daily. 09/11/20   Vanessa Kick, MD  [place] 18 MG/3ML SOPN INJECT 1.8 MG UNDER THE SKIN EVERY DAY 10/14/20   Camillia Herter, NP    Family History Family History  Problem Relation Age of Onset  . Diabetes Father   . Diabetes Paternal Grandmother   . Diabetes Maternal Grandmother   . Mental illness Cousin   . Healthy Mother     Social History Social History   Tobacco Use  . Smoking status: Current Some Day Smoker  . Smokeless tobacco: Never Used  Vaping Use  . Vaping Use: Some days  Substance Use Topics  . Alcohol use: Not Currently    Comment: occasional  . Drug use: Not Currently    Types: Marijuana     Allergies   Patient has no known allergies.   Review of Systems Review of Systems  Constitutional: Positive for fatigue. Negative for fever.  HENT: Negative for mouth sores.   Eyes: Negative for visual disturbance.  Respiratory: Negative for shortness of breath.   Cardiovascular: Negative for chest pain.  Gastrointestinal: Positive for diarrhea, nausea and vomiting. Negative for abdominal pain.  Genitourinary: Negative for genital sores.  Musculoskeletal: Negative for arthralgias and joint swelling.  Skin: Positive for color change and rash. Negative for wound.  Neurological: Negative for dizziness, weakness, light-headedness and headaches.     Physical Exam Triage Vital Signs ED Triage Vitals [10/26/20 1800]  Enc Vitals Group     BP (!) 151/83     Pulse Rate 76     Resp 18     Temp 98 F (36.7 C)     Temp Source Oral     SpO2 99 %     Weight      Height      Head Circumference      Peak Flow      Pain Score 9     Pain Loc      Pain Edu?      Excl. in [place]?    No data  found.  Updated Vital Signs BP (!) 151/83 (BP Location: Left Arm)   Pulse 76   Temp 98 F (36.7 C) (Oral)   Resp 18   LMP 10/25/2020   SpO2 99%   Visual Acuity Right Eye Distance:   Left Eye Distance:  Bilateral Distance:    Right Eye Near:   Left Eye Near:    Bilateral Near:     Physical Exam Vitals and nursing note reviewed.  Constitutional:      Appearance: She is well-developed.     Comments: Appears uncomfortable, slightly diaphoretic  HENT:     Head: Normocephalic and atraumatic.     Ears:     Comments: Bilateral ears without tenderness to palpation of external auricle, tragus and mastoid, EAC's without erythema or swelling, TM's with good bony landmarks and cone of light. Non erythematous.     Nose: Nose normal.     Mouth/Throat:     Comments: Oral mucosa pink and moist, no tonsillar enlargement or exudate. Posterior pharynx patent and nonerythematous, no uvula deviation or swelling. Normal phonation.  Eyes:     Conjunctiva/sclera: Conjunctivae normal.  Cardiovascular:     Rate and Rhythm: Normal rate and regular rhythm.  Pulmonary:     Effort: Pulmonary effort is normal. No respiratory distress.     Comments: Breathing comfortably at rest, CTABL, no wheezing, rales or other adventitious sounds auscultated  Abdominal:     General: There is no distension.     Comments: Soft, nondistended, nontender To palpation throughout abdomen, palpating abdomen increases patient's nauseousness/uneasiness  Musculoskeletal:        General: Normal range of motion.     Cervical back: Neck supple.  Skin:    General: Skin is warm and dry.  Neurological:     Mental Status: She is alert and oriented to person, place, and time.      UC Treatments / Results  Labs (all labs ordered are listed, but only abnormal results are displayed) Labs Reviewed - No data to display  EKG   Radiology No results found.  Procedures Procedures (including critical care  time)  Medications Ordered in UC Medications  ondansetron (ZOFRAN-ODT) disintegrating tablet 4 mg (4 mg Oral Given 10/26/20 1803)  ondansetron (ZOFRAN-ODT) disintegrating tablet 4 mg (4 mg Oral Given 10/26/20 1831)    Initial Impression / Assessment and Plan / UC Course  I have reviewed the triage vital signs and the nursing notes.  Pertinent labs & imaging results that were available during my care of the patient were reviewed by me and considered in my medical decision making (see chart for details).     Zofran provided in clinic, will send home with Zofran as well to further attempt to suppress nausea.  Suspect likely viral gastroenteritis given acute onset, but discussed with patient following up in emergency room if developing any significant abdominal pain or persistent nausea and vomiting despite use of Zofran.  Push fluids/oral rehydration.  Discussed strict return precautions. Patient verbalized understanding and is agreeable with plan.  Final Clinical Impressions(s) / UC Diagnoses   Final diagnoses:  Nausea vomiting and diarrhea     Discharge Instructions     Zofran dissolved in mouth 1 to 2 tablets every 8 hours for nausea/vomiting; please wait 8 hours until taking an additional dose since we gave you 8 mg before you left Focus on drinking plenty of fluids Rest Go to the emergency room if developing persistent nausea/vomiting, worsening abdominal pain for imaging of abdomen, rule out appendicitis   ED Prescriptions    Medication Sig Dispense Auth. Provider   ondansetron (ZOFRAN ODT) 4 MG disintegrating tablet Take 1-2 tablets (4-8 mg total) by mouth every 8 (eight) hours as needed for nausea or vomiting. 24 tablet Noya Santarelli, [place] C, PA-C  PDMP not reviewed this encounter.   Callahan Peddie, [place] C, PA-C 10/28/20 0730

## 2020-10-28 NOTE — Discharge Instructions (Signed)
Your work-up today was overall reassuring with no evidence of acute cholecystitis.  We did see the diverticulosis with no evidence of diverticulitis.  Please use the new rectal nausea medicine to help with your nausea and maintain hydration.  Please get some rest.  If any symptoms change or worsen, please return to the nearest emergency department.

## 2020-10-29 LAB — URINE CULTURE: Culture: 50000 — AB

## 2020-10-30 ENCOUNTER — Telehealth: Payer: Self-pay

## 2020-10-30 NOTE — Telephone Encounter (Signed)
No treatment for [facility] ED 10/28/20 Per Pharm D  Carrie L

## 2020-11-01 ENCOUNTER — Telehealth: Payer: Self-pay | Admitting: Family

## 2020-11-01 NOTE — Telephone Encounter (Signed)
Pt came in the office stating she needs refills on her medication. Informed pt that medications may not be refilled since she has not been seen in quite sometime. I had pt make an appoint to come in and be seen on Monday 11/08/2020 at 3:30p.  ARIPiprazole (ABILIFY) 10 MG tablet atorvastatin (LIPITOR) 80 MG tablet  LORazepam (ATIVAN) 1 MG tablet  metFORMIN (GLUCOPHAGE) 1000 MG tablet   Pharmacy  [facility] [address] [place], [place] [address] [phone]

## 2020-11-03 NOTE — Telephone Encounter (Signed)
Pt will have meds refilled at appt 05/09

## 2020-11-07 NOTE — Progress Notes (Signed)
Virtual Visit via Telephone Note  I connected with [name], on 11/08/2020 at 4:05 PM by telephone due to the COVID-19 pandemic and verified that I am speaking with the correct person using two identifiers.  Due to current restrictions/limitations of in-office visits due to the COVID-19 pandemic, this scheduled clinical appointment was converted to a telehealth visit.   Consent: I discussed the limitations, risks, security and privacy concerns of performing an evaluation and management service by telephone and the availability of in person appointments. I also discussed with the patient that there may be a patient responsible charge related to this service. The patient expressed understanding and agreed to proceed.   Location of Patient: Home  Location of Provider: [facility] [place]   Persons participating in Telemedicine visit: Kaiulani Lair  , NP Eboney Williams, CMA   History of Present Illness:  is a 45 y.o. female who presents for schizoaffective disorder follow-up. Her concerns today include:   1. SCHIZOAFFECTIVE DISORDER FOLLOW-UP: 06/15/2020 per DO note: Discussed will refill Abilify as do not want patient to have new onset psychosis or mania without medication, but that in near future would be more appropriate if this medication were managed by psych. Patient has f/u with psych in <1 month.   Visit 08/16/2020 - 08/20/2020 at [facility] discharge plan per MD note: - Follow-up with [facility]. - 30 day scripts sent to pharmacy.  11/08/2020: Reports she has been 3 weeks without medication. Reports she was not aware that she needs to have medications refilled by Psychiatry. She doesn't understand why Primary Care cannot manage medications on long-term basis. Reports she has 2 therapy sessions and 1 provider appointment each month with [facility]. Requesting refills of Abilify, Depakote, and Ativan. Denies thoughts of self-harm,  suicidal ideations, and homicidal ideations.   2. DIABETES TYPE 2 FOLLOW-UP: 06/23/2020: - Hemoglobin A1C not at goal today at 10.5%, goal < 8%. Hemoglobin A1C improved from previous 13.2% on 02/25/2020. - CBG, elevated today in office at 245 and patient non-fasting. - Continue Metformin and Liraglutide as prescribed. - Liraglutide refilled prior to today's visit on 06/15/2020 and refills available on file. - Continue Gabapentin as prescribed for diabetic neuropathy. - Begin Dapagliflozin Propanediol as prescribed for added diabetes control.  - Follow-up in 4 weeks with primary physician for diabetes checkup. Write your home blood sugar results down each day and bring those results to your appointment along with your home glucose monitor.  11/08/2020: Last A1C: 8.9% on 08/19/2020 Med Adherence: Ran out of medications 3 weeks ago. Requesting Metformin, Farxiga, and Victoza. Medication side effects:  [] Yes    [x] No Home Monitoring?  230's after eating  Diet Adherence: [x] Yes    [] No Exercise: [x] Yes    [] No Comments: Needs diabetic foot exam.   3. HYPERTENSION FOLLOW-UP: 01/20/2020 per DO note: Has not been compliant with Lisinopril. Encouraged to restart especially due to renal protection with history of DM.   11/08/2020: Currently taking: see medication list Med Adherence: Ran out of medication several weeks ago  Medication side effects: [] Yes    [x] No Adherence with salt restriction (low-salt diet): [x] Yes    [] No Exercise: Yes [x] No [] Home Monitoring?: [] Yes    [x] No  Monitoring Frequency: [] Yes    [x] No Home BP results range: [] Yes    [x] No Smoking: No, does vape SOB? [] Yes    [x]   No Chest Pain?: [] Yes    [x] No Leg swelling?: [] Yes    [x] No Headaches?: [] Yes    [x] No Dizziness? [] Yes    [x] No Comments: Requesting Lisinopril.   4. HYPERLIPIDEMIA FOLLOW-UP: 03/11/2020 per DO note: Discussed that her ASCVD risk is >20%. Needs to pick up the statin sent to  pharmacy from results a couple weeks ago as well as start ASA 81 mg daily. Would recommend aiming for 150 minutes of cardiac exercise per week. Advise to decrease animal fat intake, saturated fats, processed foods. Increase fiber rich foods such as fruits and veggies.   11/08/2020: Med Adherence: [x] Yes    [] No Medication side effects: [] Yes    [x] No Muscle aches:  [] Yes    [x] No Comments: Requesting refill of Atorvastatin   Past Medical History:  Diagnosis Date  . Abnormal Pap smear   . Bipolar 1 disorder (HCC)   . Diabetes mellitus without complication (HCC)   . Fibroids   . Hypertension   . IBS (irritable bowel syndrome)    No Known Allergies  Current Outpatient Medications on File Prior to Visit  Medication Sig Dispense Refill  . Accu-Chek Softclix Lancets lancets Use to check FSBS BID. Dx: E11.42 100 each 2  . ARIPiprazole (ABILIFY) 10 MG tablet Take 1 tablet (10 mg total) by mouth daily. 30 tablet 1  . atorvastatin (LIPITOR) 80 MG tablet Take 1 tablet (80 mg total) by mouth daily. 90 tablet 3  . Blood Glucose Monitoring Suppl (ACCU-CHEK GUIDE ME) w/Device KIT Use to check FSBS BID. Dx: E11.42 1 kit 0  . carbamide peroxide (DEBROX) 6.5 % OTIC solution Place 5 drops into the left ear 2 (two) times daily. 15 mL 0  . dapagliflozin propanediol (FARXIGA) 5 MG TABS tablet Take 1 tablet (5 mg total) by mouth daily before breakfast. 30 tablet 0  . divalproex (DEPAKOTE) 250 MG DR tablet Take 1 tablet (250 mg total) by mouth 2 (two) times daily. 60 tablet 1  . ibuprofen (ADVIL) 800 MG tablet Take 1 tablet (800 mg total) by mouth 3 (three) times daily with meals. 21 tablet 0  . Insulin Pen Needle (TRUEPLUS PEN NEEDLES) 32G X 4 MM MISC Use as instructed to inject Victoza daily. 100 each 2  . lisinopril (ZESTRIL) 5 MG tablet Take 1 tablet (5 mg total) by mouth daily. 90 tablet 1  . LORazepam (ATIVAN) 1 MG tablet Take 1 tablet (1 mg total) by mouth 3 (three) times daily. 90 tablet 0  .  metFORMIN (GLUCOPHAGE) 1000 MG tablet Take 1 tablet (1,000 mg total) by mouth 2 (two) times daily with a meal. 180 tablet 0  . metoCLOPramide (REGLAN) 10 MG tablet Take 1 tablet (10 mg total) by mouth every 6 (six) hours as needed for nausea or vomiting. 12 tablet 0  . neomycin-polymyxin-hydrocortisone (CORTISPORIN) 3.5-10000-1 OTIC suspension Place 4 drops into the right ear 3 (three) times daily. 10 mL 0  . ondansetron (ZOFRAN ODT) 4 MG disintegrating tablet Take 1-2 tablets (4-8 mg total) by mouth every 8 (eight) hours as needed for nausea or vomiting. 24 tablet 0  . promethazine (PHENERGAN) 25 MG suppository Place 1 suppository (25 mg total) rectally every 6 (six) hours as needed for nausea or vomiting. 12 each 0  . VICTOZA 18 MG/3ML SOPN INJECT 1.8 MG UNDER THE SKIN EVERY DAY 9 mL 0   No current facility-administered medications on file prior to visit.      Observations/Objective: Alert and oriented x 3. Not in acute distress. Physical examination not completed as this is a telemedicine visit.  Assessment and Plan: 1. Schizoaffective disorder, bipolar type (HCC): - Stable.  - Denies thoughts of self-harm, suicidal ideations, and homicidal ideations.  - Continue Aripiprazole, Divalproex, and Lorazepam as prescribed. Courtesy refill for 30 days. - Patient currently services with [facility]. - Counseled to follow-up with Psychiatry for further evaluation and management. Patient verbalized understanding and agreement. - ARIPiprazole (ABILIFY) 10 MG tablet; Take 1 tablet (10 mg total) by mouth daily.  Dispense: 30 tablet; Refill: 0 - divalproex (DEPAKOTE) 250 MG DR tablet; Take 1 tablet (250 mg total) by mouth 2 (two) times daily.  Dispense: 60 tablet; Refill: 0 - LORazepam (ATIVAN) 1 MG tablet; Take 1 tablet (1 mg total) by mouth 3 (three) times daily.  Dispense: 90 tablet; Refill: 0 - Ambulatory referral to Psychiatry  2. Type 2 diabetes mellitus with diabetic polyneuropathy, without long-term  current use of insulin (HCC): - Last hemoglobin A1c not at goal at  8.9% on 08/19/2020, goal < 7%.  - Continue Metformin, Liraglutide, and Dapagliflozin Propanediol as prescribed. Courtesy refill for 30 days. - Counseled patient that an office visit is needed to update hemoglobin A1c. Patient verbalized understanding.  - Discussed the importance of healthy eating habits, low-carbohydrate diet, low-sugar diet, regular aerobic exercise (at least 150 minutes a week as tolerated) and medication compliance to achieve or maintain control of diabetes. - Follow-up with primary provider within 4 weeks or sooner if needed.  - metFORMIN (GLUCOPHAGE) 1000 MG tablet; Take 1 tablet (1,000 mg total) by mouth 2 (two) times daily with a meal.  Dispense: 60 tablet; Refill: 0 - liraglutide (VICTOZA) 18 MG/3ML SOPN; INJECT 1.8 MG UNDER THE SKIN EVERY DAY  Dispense: 9 mL; Refill: 0 - dapagliflozin propanediol (FARXIGA) 5 MG TABS tablet; Take 1 tablet (5 mg total) by mouth daily before breakfast.  Dispense: 30 tablet; Refill: 0  3. Essential hypertension: - Continue Lisinopril as prescribed. Courtesy refill for 30 days.  - Counseled patient that an office visit is needed for additional refills. Patient verbalized understanding.  - Counseled on blood pressure goal of less than 130/80, low-sodium, DASH diet, medication compliance, 150 minutes of moderate intensity exercise per week as tolerated. Discussed medication compliance, adverse effects. - Follow-up with primary provider within 4 weeks or sooner if needed.  - lisinopril (ZESTRIL) 5 MG tablet; Take 1 tablet (5 mg total) by mouth daily.  Dispense: 30 tablet; Refill: 0  4. Hyperlipidemia, unspecified hyperlipidemia type: - Continue Atorvastatin as prescribed.  -Practice low-fat heart healthy diet and at least 150 minutes of moderate intensity exercise weekly as tolerated.  - Follow-up with primary provider as scheduled. - atorvastatin (LIPITOR) 80 MG tablet; Take 1  tablet (80 mg total) by mouth daily.  Dispense: 90 tablet; Refill: 0  5. Encounter for diabetic foot exam (HCC): - Referral to Podiatry for diabetic foot exam. - Ambulatory referral to Podiatry   Follow Up Instructions: Follow-up with primary provider in 4 weeks or sooner if needed. Referral to Psychiatry.   Patient was given clear instructions to go to Emergency Department or return to medical center if symptoms don't improve, worsen, or new problems develop.The patient verbalized understanding.  I discussed the assessment and treatment plan with the patient. The patient was provided an opportunity to ask questions and all were answered. The patient agreed with the plan and demonstrated an understanding of the instructions.   The patient   was advised to call back or seek an in-person evaluation if the symptoms worsen or if the condition fails to improve as anticipated.   I provided 20 minutes total of non-face-to-face time during this encounter.    J , NP  [facility] [place],  [phone] 11/08/2020, 4:05 PM 

## 2020-11-08 ENCOUNTER — Other Ambulatory Visit: Payer: Self-pay

## 2020-11-08 ENCOUNTER — Ambulatory Visit (INDEPENDENT_AMBULATORY_CARE_PROVIDER_SITE_OTHER): Payer: Medicaid Other | Admitting: Family

## 2020-11-08 DIAGNOSIS — E1142 Type 2 diabetes mellitus with diabetic polyneuropathy: Secondary | ICD-10-CM

## 2020-11-08 DIAGNOSIS — F250 Schizoaffective disorder, bipolar type: Secondary | ICD-10-CM

## 2020-11-08 DIAGNOSIS — E119 Type 2 diabetes mellitus without complications: Secondary | ICD-10-CM

## 2020-11-08 DIAGNOSIS — I10 Essential (primary) hypertension: Secondary | ICD-10-CM | POA: Diagnosis not present

## 2020-11-08 DIAGNOSIS — E785 Hyperlipidemia, unspecified: Secondary | ICD-10-CM | POA: Diagnosis not present

## 2020-11-08 NOTE — Progress Notes (Signed)
Medication refill  Referral for podiatrist (diabetic foot exam) Abilify, Atorvastatin, Farxiga, Depakote, Ativan, Lisinopril, Metformin, and Victoza

## 2020-11-09 ENCOUNTER — Other Ambulatory Visit: Payer: Self-pay | Admitting: Internal Medicine

## 2020-11-09 DIAGNOSIS — E1142 Type 2 diabetes mellitus with diabetic polyneuropathy: Secondary | ICD-10-CM

## 2020-11-09 MED ORDER — LORAZEPAM 1 MG PO TABS: 1.0000 mg | ORAL_TABLET | Freq: Three times a day (TID) | ORAL | 0 refills | Status: DC

## 2020-11-09 MED ORDER — ATORVASTATIN CALCIUM 80 MG PO TABS: 80.0000 mg | ORAL_TABLET | Freq: Every day | ORAL | 0 refills | Status: DC

## 2020-11-09 MED ORDER — METFORMIN HCL 1000 MG PO TABS: 1000.0000 mg | ORAL_TABLET | Freq: Two times a day (BID) | ORAL | 0 refills | Status: DC

## 2020-11-09 MED ORDER — ARIPIPRAZOLE 10 MG PO TABS: 10.0000 mg | ORAL_TABLET | Freq: Every day | ORAL | 0 refills | Status: DC

## 2020-11-09 MED ORDER — DIVALPROEX SODIUM 250 MG PO DR TAB: 250.0000 mg | DELAYED_RELEASE_TABLET | Freq: Two times a day (BID) | ORAL | 0 refills | Status: DC

## 2020-11-09 MED ORDER — LISINOPRIL 5 MG PO TABS: 5.0000 mg | ORAL_TABLET | Freq: Every day | ORAL | 0 refills | Status: DC

## 2020-11-09 MED ORDER — DAPAGLIFLOZIN PROPANEDIOL 5 MG PO TABS: 5.0000 mg | ORAL_TABLET | Freq: Every day | ORAL | 0 refills | Status: DC

## 2020-11-09 MED ORDER — VICTOZA 18 MG/3ML ~~LOC~~ SOPN: PEN_INJECTOR | SUBCUTANEOUS | 0 refills | Status: DC

## 2020-11-17 ENCOUNTER — Encounter: Payer: Self-pay | Admitting: Podiatry

## 2020-11-24 ENCOUNTER — Other Ambulatory Visit: Payer: Self-pay | Admitting: Internal Medicine

## 2020-11-24 DIAGNOSIS — E1142 Type 2 diabetes mellitus with diabetic polyneuropathy: Secondary | ICD-10-CM

## 2020-12-13 ENCOUNTER — Ambulatory Visit (HOSPITAL_COMMUNITY): Payer: Medicaid Other

## 2020-12-13 ENCOUNTER — Other Ambulatory Visit: Payer: Self-pay

## 2020-12-21 ENCOUNTER — Encounter (HOSPITAL_BASED_OUTPATIENT_CLINIC_OR_DEPARTMENT_OTHER): Payer: Self-pay | Admitting: *Deleted

## 2020-12-21 ENCOUNTER — Emergency Department (HOSPITAL_BASED_OUTPATIENT_CLINIC_OR_DEPARTMENT_OTHER)
Admission: EM | Admit: 2020-12-21 | Discharge: 2020-12-22 | Disposition: A | Discharge status: Home / Self Care | Payer: Medicaid Other | Attending: Emergency Medicine | Admitting: Emergency Medicine

## 2020-12-21 ENCOUNTER — Other Ambulatory Visit: Payer: Self-pay

## 2020-12-21 DIAGNOSIS — I10 Essential (primary) hypertension: Secondary | ICD-10-CM | POA: Diagnosis not present

## 2020-12-21 DIAGNOSIS — Z7984 Long term (current) use of oral hypoglycemic drugs: Secondary | ICD-10-CM | POA: Insufficient documentation

## 2020-12-21 DIAGNOSIS — Z79899 Other long term (current) drug therapy: Secondary | ICD-10-CM | POA: Diagnosis not present

## 2020-12-21 DIAGNOSIS — R197 Diarrhea, unspecified: Secondary | ICD-10-CM | POA: Insufficient documentation

## 2020-12-21 DIAGNOSIS — R112 Nausea with vomiting, unspecified: Secondary | ICD-10-CM | POA: Insufficient documentation

## 2020-12-21 DIAGNOSIS — Z794 Long term (current) use of insulin: Secondary | ICD-10-CM | POA: Diagnosis not present

## 2020-12-21 DIAGNOSIS — E119 Type 2 diabetes mellitus without complications: Secondary | ICD-10-CM | POA: Diagnosis not present

## 2020-12-21 DIAGNOSIS — F17200 Nicotine dependence, unspecified, uncomplicated: Secondary | ICD-10-CM | POA: Insufficient documentation

## 2020-12-21 LAB — COMPREHENSIVE METABOLIC PANEL
ALT: 18 U/L (ref 0–44)
AST: 16 U/L (ref 15–41)
Albumin: 4.1 g/dL (ref 3.5–5.0)
Alkaline Phosphatase: 74 U/L (ref 38–126)
Anion gap: 11 (ref 5–15)
BUN: 12 mg/dL (ref 6–20)
CO2: 22 mmol/L (ref 22–32)
Calcium: 9.5 mg/dL (ref 8.9–10.3)
Chloride: 103 mmol/L (ref 98–111)
Creatinine, Ser: 0.55 mg/dL (ref 0.44–1.00)
GFR, Estimated: >60 mL/min (ref >60)
Glucose, Bld: 251 mg/dL — ABNORMAL HIGH (ref 70–99)
Potassium: 4.2 mmol/L (ref 3.5–5.1)
Sodium: 136 mmol/L (ref 135–145)
Total Bilirubin: 0.8 mg/dL (ref 0.3–1.2)
Total Protein: 8.1 g/dL (ref 6.5–8.1)

## 2020-12-21 LAB — CBC WITH DIFFERENTIAL/PLATELET
Abs Immature Granulocytes: 0.06 10*3/uL (ref 0.00–0.07)
Basophils Absolute: 0 10*3/uL (ref 0.0–0.1)
Basophils Relative: 0 %
Eosinophils Absolute: 0 10*3/uL (ref 0.0–0.5)
Eosinophils Relative: 0 %
HCT: 36.3 % (ref 36.0–46.0)
Hemoglobin: 11.9 g/dL — ABNORMAL LOW (ref 12.0–15.0)
Immature Granulocytes: 1 %
Lymphocytes Relative: 6 %
Lymphs Abs: 0.8 10*3/uL (ref 0.7–4.0)
MCH: 24.8 pg — ABNORMAL LOW (ref 26.0–34.0)
MCHC: 32.8 g/dL (ref 30.0–36.0)
MCV: 75.6 fL — ABNORMAL LOW (ref 80.0–100.0)
Monocytes Absolute: 0.2 10*3/uL (ref 0.1–1.0)
Monocytes Relative: 1 %
Neutro Abs: 11.2 10*3/uL — ABNORMAL HIGH (ref 1.7–7.7)
Neutrophils Relative %: 92 %
Platelets: 475 10*3/uL — ABNORMAL HIGH (ref 150–400)
RBC: 4.8 MIL/uL (ref 3.87–5.11)
RDW: 17.2 % — ABNORMAL HIGH (ref 11.5–15.5)
WBC: 12.3 10*3/uL — ABNORMAL HIGH (ref 4.0–10.5)
nRBC: 0 % (ref 0.0–0.2)

## 2020-12-21 LAB — LIPASE, BLOOD: Lipase: 25 U/L (ref 11–51)

## 2020-12-21 MED ORDER — SODIUM CHLORIDE 0.9 % IV BOLUS
1000.0000 mL | Freq: Once | INTRAVENOUS | Status: AC
  Administered 2020-12-22: 1000 mL via INTRAVENOUS

## 2020-12-21 MED ORDER — ONDANSETRON HCL 4 MG/2ML IJ SOLN
4.0000 mg | Freq: Once | INTRAMUSCULAR | Status: AC
  Administered 2020-12-22: 4 mg via INTRAVENOUS
  Filled 2020-12-21: qty 2

## 2020-12-21 NOTE — ED Triage Notes (Signed)
C/o n/v/d x 1 day

## 2020-12-22 LAB — URINALYSIS, MICROSCOPIC (REFLEX)

## 2020-12-22 LAB — URINALYSIS, ROUTINE W REFLEX MICROSCOPIC
Bilirubin Urine: NEGATIVE
Glucose, UA: >=500 mg/dL — AB
Ketones, ur: >80 mg/dL — AB
Leukocytes,Ua: NEGATIVE
Nitrite: NEGATIVE
Protein, ur: NEGATIVE mg/dL
Specific Gravity, Urine: 1.025 (ref 1.005–1.030)
pH: 6 (ref 5.0–8.0)

## 2020-12-22 MED ORDER — LIDOCAINE VISCOUS HCL 2 % MT SOLN
15.0000 mL | Freq: Once | OROMUCOSAL | Status: AC
  Administered 2020-12-22: 15 mL via ORAL
  Filled 2020-12-22: qty 15

## 2020-12-22 MED ORDER — ALUM & MAG HYDROXIDE-SIMETH 200-200-20 MG/5ML PO SUSP
30.0000 mL | Freq: Once | ORAL | Status: AC
  Administered 2020-12-22: 30 mL via ORAL
  Filled 2020-12-22: qty 30

## 2020-12-22 MED ORDER — METOCLOPRAMIDE HCL 5 MG/ML IJ SOLN
10.0000 mg | Freq: Once | INTRAMUSCULAR | Status: AC
  Administered 2020-12-22: 10 mg via INTRAVENOUS
  Filled 2020-12-22: qty 2

## 2020-12-22 MED ORDER — ONDANSETRON 4 MG PO TBDP: 4.0000 mg | ORAL_TABLET | Freq: Three times a day (TID) | ORAL | 0 refills | Status: AC | PRN

## 2020-12-22 MED ORDER — PROMETHAZINE HCL 25 MG RE SUPP: 25.0000 mg | Freq: Once | RECTAL | Status: DC

## 2020-12-22 MED ORDER — PROCHLORPERAZINE 25 MG RE SUPP
25.0000 mg | Freq: Once | RECTAL | Status: AC
  Administered 2020-12-22: 25 mg via RECTAL
  Filled 2020-12-22: qty 1

## 2020-12-22 MED ORDER — HYOSCYAMINE SULFATE 0.125 MG SL SUBL
0.1250 mg | SUBLINGUAL_TABLET | Freq: Once | SUBLINGUAL | Status: AC
  Administered 2020-12-22: 0.125 mg via SUBLINGUAL
  Filled 2020-12-22: qty 1

## 2020-12-22 MED ORDER — SODIUM CHLORIDE 0.9 % IV BOLUS
1000.0000 mL | Freq: Once | INTRAVENOUS | Status: AC
  Administered 2020-12-22: 1000 mL via INTRAVENOUS

## 2020-12-22 NOTE — ED Provider Notes (Signed)
[facility] Provider Note  CSN: [id number] Arrival date & time: 12/21/20 2052  Chief Complaint(s) Vomiting  HPI [name] is a 45 y.o. female   The history is provided by the patient.  Abdominal Cramping This is a recurrent problem. The current episode started 12 to 24 hours ago. Episode frequency: intermittent. Pertinent negatives include no chest pain, no headaches and no shortness of breath. Exacerbated by: emesis. Nothing relieves the symptoms. She has tried nothing for the symptoms.   No known suspicious food intake, or recent travel. Patient still smoking marijuana.  Past Medical History Past Medical History:  Diagnosis Date   Abnormal Pap smear    Bipolar 1 disorder (Grandfather)    Diabetes mellitus without complication ([place])    Fibroids    Hypertension    IBS (irritable bowel syndrome)    Patient Active Problem List   Diagnosis Date Noted   Essential hypertension 04/08/2019   Class 2 severe obesity due to excess calories with serious comorbidity and body mass index (BMI) of 39.0 to 39.9 in adult ([place]) 04/08/2019   Tobacco dependence 04/08/2019   Schizoaffective disorder, bipolar type ([place]) 05/18/2018   Diabetes mellitus ([place]) 08/10/2016   Bipolar disorder, curr episode mixed, severe, with psychotic features ([place]) 08/07/2016   Cannabis use disorder, moderate, dependence ([place]) 08/07/2016   CHOLECYSTITIS, UNSPECIFIED 25/42/7062   BILIARY COLIC 37/62/8315   CERUMEN IMPACTION, BILATERAL 01/17/2008   PHARYNGITIS, VIRAL 01/17/2008   NECK PAIN 01/17/2008   Home Medication(s) Prior to Admission medications   Medication Sig Start Date End Date Taking? Authorizing Provider  ondansetron (ZOFRAN ODT) 4 MG disintegrating tablet Take 1 tablet (4 mg total) by mouth every 8 (eight) hours as needed for up to 3 days for nausea or vomiting. 12/22/20 12/25/20 Yes Drevion Offord, Grayce Sessions, MD  Accu-Chek Softclix Lancets lancets Use to check FSBS BID. Dx: E11.42  03/11/20   [name] Bang, MD  ARIPiprazole (ABILIFY) 10 MG tablet Take 1 tablet (10 mg total) by mouth daily. 11/09/20   Camillia Herter, NP  atorvastatin (LIPITOR) 80 MG tablet Take 1 tablet (80 mg total) by mouth daily. 11/09/20   Camillia Herter, NP  Blood Glucose Monitoring Suppl (ACCU-CHEK GUIDE ME) w/Device KIT Use to check FSBS BID. Dx: E11.42 03/11/20   [name] Bang, MD  carbamide peroxide (DEBROX) 6.5 % OTIC solution Place 5 drops into the left ear 2 (two) times daily. 09/13/20   Wieters, Hallie C, PA-C  dapagliflozin propanediol (FARXIGA) 5 MG TABS tablet Take 1 tablet (5 mg total) by mouth daily before breakfast. 11/09/20   Camillia Herter, NP  divalproex (DEPAKOTE) 250 MG DR tablet Take 1 tablet (250 mg total) by mouth 2 (two) times daily. 11/09/20   Camillia Herter, NP  ibuprofen (ADVIL) 800 MG tablet Take 1 tablet (800 mg total) by mouth 3 (three) times daily with meals. 09/11/20   Vanessa Kick, MD  Insulin Pen Needle (TRUEPLUS PEN NEEDLES) 32G X 4 MM MISC Use as instructed to inject Victoza daily. 03/11/20   [name] Bang, MD  liraglutide (VICTOZA) 18 MG/3ML SOPN INJECT 1.8 MG UNDER THE SKIN EVERY DAY 11/09/20   Camillia Herter, NP  lisinopril (ZESTRIL) 5 MG tablet Take 1 tablet (5 mg total) by mouth daily. 11/09/20   Camillia Herter, NP  LORazepam (ATIVAN) 1 MG tablet Take 1 tablet (1 mg total) by mouth 3 (three) times daily. 11/09/20   Camillia Herter, NP  metFORMIN (GLUCOPHAGE)  1000 MG tablet Take 1 tablet (1,000 mg total) by mouth 2 (two) times daily with a meal. 11/09/20   Camillia Herter, NP  metoCLOPramide (REGLAN) 10 MG tablet Take 1 tablet (10 mg total) by mouth every 6 (six) hours as needed for nausea or vomiting. 10/27/20   Molpus, Jenny Reichmann, MD  neomycin-polymyxin-hydrocortisone (CORTISPORIN) 3.5-10000-1 OTIC suspension Place 4 drops into the right ear 3 (three) times daily. 09/11/20   Vanessa Kick, MD  promethazine (PHENERGAN) 25 MG suppository Place 1  suppository (25 mg total) rectally every 6 (six) hours as needed for nausea or vomiting. 10/28/20   Tegeler, Gwenyth Allegra, MD                                                                                                                                    Past Surgical History Past Surgical History:  Procedure Laterality Date   CERVICAL BIOPSY     CHOLECYSTECTOMY     ESSURE TUBAL LIGATION     HIATAL HERNIA REPAIR     TUBAL LIGATION     Family History Family History  Problem Relation Age of Onset   Diabetes Father    Diabetes Paternal Grandmother    Diabetes Maternal Grandmother    Mental illness Cousin    Healthy Mother     Social History Social History   Tobacco Use   Smoking status: Some Days    Pack years: 0.00   Smokeless tobacco: Never  Vaping Use   Vaping Use: Some days  Substance Use Topics   Alcohol use: Not Currently    Comment: occasional   Drug use: Not Currently    Types: Marijuana   Allergies Patient has no known allergies.  Review of Systems Review of Systems  Respiratory:  Negative for shortness of breath.   Cardiovascular:  Negative for chest pain.  Neurological:  Negative for headaches.  All other systems are reviewed and are negative for acute change except as noted in the HPI  Physical Exam Vital Signs  I have reviewed the triage vital signs BP (!) 150/82   Pulse 90   Temp 98 F (36.7 C) (Oral)   Resp 16   Ht 6' (1.829 m)   Wt 117.9 kg   LMP 12/21/2020   SpO2 100%   BMI 35.26 kg/m  Currently on her menstrual cycle  Physical Exam Vitals reviewed.  Constitutional:      General: She is not in acute distress.    Appearance: She is well-developed. She is not diaphoretic.  HENT:     Head: Normocephalic and atraumatic.     Nose: Nose normal.  Eyes:     General: No scleral icterus.       Right eye: No discharge.        Left eye: No discharge.     Conjunctiva/sclera: Conjunctivae normal.     Pupils: Pupils are equal, round, and  reactive to light.  Cardiovascular:     Rate and Rhythm: Normal rate and regular rhythm.     Heart sounds: No murmur heard.   No friction rub. No gallop.  Pulmonary:     Effort: Pulmonary effort is normal. No respiratory distress.     Breath sounds: Normal breath sounds. No stridor. No rales.  Abdominal:     General: There is no distension.     Palpations: Abdomen is soft.     Tenderness: There is no abdominal tenderness. There is no guarding or rebound.  Musculoskeletal:        General: No tenderness.     Cervical back: Normal range of motion and neck supple.  Skin:    General: Skin is warm and dry.     Findings: No erythema or rash.  Neurological:     Mental Status: She is alert and oriented to person, place, and time.    ED Results and Treatments Labs (all labs ordered are listed, but only abnormal results are displayed) Labs Reviewed  CBC WITH DIFFERENTIAL/PLATELET - Abnormal; Notable for the following components:      Result Value   WBC 12.3 (*)    Hemoglobin 11.9 (*)    MCV 75.6 (*)    MCH 24.8 (*)    RDW 17.2 (*)    Platelets 475 (*)    Neutro Abs 11.2 (*)    All other components within normal limits  COMPREHENSIVE METABOLIC PANEL - Abnormal; Notable for the following components:   Glucose, Bld 251 (*)    All other components within normal limits  URINALYSIS, ROUTINE W REFLEX MICROSCOPIC - Abnormal; Notable for the following components:   Glucose, UA >=500 (*)    Hgb urine dipstick LARGE (*)    Ketones, ur >80 (*)    All other components within normal limits  URINALYSIS, MICROSCOPIC (REFLEX) - Abnormal; Notable for the following components:   Bacteria, UA RARE (*)    All other components within normal limits  LIPASE, BLOOD                                                                                                                         EKG  EKG Interpretation  Date/Time:    Ventricular Rate:    PR Interval:    QRS Duration:   QT Interval:    QTC  Calculation:   R Axis:     Text Interpretation:          Radiology No results found.  Pertinent labs & imaging results that were available during my care of the patient were reviewed by me and considered in my medical decision making (see chart for details).  Medications Ordered in ED Medications  sodium chloride 0.9 % bolus 1,000 mL ( Intravenous Stopped 12/22/20 0259)  ondansetron (ZOFRAN) injection 4 mg (4 mg Intravenous Given 12/22/20 0042)  metoCLOPramide (REGLAN) injection 10 mg (10 mg Intravenous Given 12/22/20 0113)  alum & mag hydroxide-simeth (MAALOX/MYLANTA) 200-200-20 MG/5ML suspension 30 mL (  30 mLs Oral Given 12/22/20 0217)    And  lidocaine (XYLOCAINE) 2 % viscous mouth solution 15 mL (15 mLs Oral Given 12/22/20 0217)  hyoscyamine (LEVSIN SL) SL tablet 0.125 mg (0.125 mg Sublingual Given 12/22/20 0119)  sodium chloride 0.9 % bolus 1,000 mL (1,000 mLs Intravenous New Bag/Given 12/22/20 0413)                                                                                                                                    Procedures Procedures  (including critical care time)  Medical Decision Making / ED Course I have reviewed the nursing notes for this encounter and the patient's prior records (if available in EHR or on provided paperwork).   [name] was evaluated in Emergency Department on 12/22/2020 for the symptoms described in the history of present illness. She was evaluated in the context of the global COVID-19 pandemic, which necessitated consideration that the patient might be at risk for infection with the SARS-CoV-2 virus that causes COVID-19. Institutional protocols and algorithms that pertain to the evaluation of patients at risk for COVID-19 are in a state of rapid change based on information released by regulatory bodies including the CDC and federal and state organizations. These policies and algorithms were followed during the patient's care in the ED.  45  y.o. female presents with vomiting, diarrhea, and abd cramping for 1-2 days. No known possible suspicious food intake.  decreased oral tolerance. Rest of history as above.  Patient appears well, not in distress, and with no signs of toxicity or dehydration. Abdomen benign.  Rest of the exam as above  Labs notable for mild leukocytosis likely demargination from emesis.  Possible previous baseline.  No significant electrolyte derangements or renal insufficiency.  Patient does have hyperglycemia without evidence of DKA.  No evidence of biliary obstruction or pancreatitis.  Low suspicion for serious intra-abdominal inflammatory/infectious process or bowel obstruction.  viral gastroenteritis, cannabinoid hyperemesis, vs gastroparesis  Patient treated symptomatically with antiemetics, GI cocktail and IV fluids.  Able to tolerate oral intake in the ED.  Discussed symptomatic treatment with the patient and they will follow closely with their PCP.       Final Clinical Impression(s) / ED Diagnoses Final diagnoses:  Nausea vomiting and diarrhea    The patient appears reasonably screened and/or stabilized for discharge and I doubt any other medical condition or other [facility] requiring further screening, evaluation, or treatment in the ED at this time prior to discharge. Safe for discharge with strict return precautions.  Disposition: Discharge  Condition: Good  I have discussed the results, Dx and Tx plan with the patient/family who expressed understanding and agree(s) with the plan. Discharge instructions discussed at length. The patient/family was given strict return precautions who verbalized understanding of the instructions. No further questions at time of discharge.    ED Discharge Orders          Ordered  ondansetron (ZOFRAN ODT) 4 MG disintegrating tablet  Every 8 hours PRN        12/22/20 0503             Follow Up: Primary care provider  Call  to schedule an appointment  for close follow up    This chart was dictated using voice recognition software.  Despite best efforts to proofread,  errors can occur which can change the documentation meaning.    Fatima Blank, MD 12/22/20 [phone]

## 2020-12-22 NOTE — ED Notes (Signed)
Pt vomited after administration of PO meds

## 2020-12-22 NOTE — ED Notes (Signed)
Pt given a cup of water and instructed to take sips.

## 2021-01-14 ENCOUNTER — Other Ambulatory Visit: Payer: Self-pay

## 2021-01-14 ENCOUNTER — Telehealth: Payer: Self-pay | Admitting: Family

## 2021-01-14 DIAGNOSIS — E1142 Type 2 diabetes mellitus with diabetic polyneuropathy: Secondary | ICD-10-CM

## 2021-01-14 MED ORDER — DAPAGLIFLOZIN PROPANEDIOL 5 MG PO TABS: 5.0000 mg | ORAL_TABLET | Freq: Every day | ORAL | 0 refills | Status: DC

## 2021-01-14 MED ORDER — METFORMIN HCL 1000 MG PO TABS: 1000.0000 mg | ORAL_TABLET | Freq: Two times a day (BID) | ORAL | 0 refills | Status: DC

## 2021-01-14 NOTE — Telephone Encounter (Signed)
Patient called needing refills. Informed patient she needed a 4wk follow up after her last visit and after she is done seeing the Provider she should always stop at the desk to check out so she can get her next appointments scheduled. Patient scheduled and appointment for 01/25/2021.  metFORMIN (GLUCOPHAGE) 1000 MG tablet  dapagliflozin propanediol (FARXIGA) 5 MG TABS tablet     Pharmacy  [id number] [place], [place] [place] [address]  Phone:  [phone]  Fax:  [phone]  DEA #:  [id number]

## 2021-01-25 ENCOUNTER — Encounter: Payer: Medicaid Other | Admitting: Family

## 2021-01-25 NOTE — Progress Notes (Signed)
Patient did not show for appointment.   

## 2021-01-28 ENCOUNTER — Other Ambulatory Visit: Payer: Self-pay | Admitting: Family

## 2021-01-28 DIAGNOSIS — E1142 Type 2 diabetes mellitus with diabetic polyneuropathy: Secondary | ICD-10-CM

## 2021-02-18 ENCOUNTER — Other Ambulatory Visit: Payer: Self-pay

## 2021-02-18 ENCOUNTER — Emergency Department (HOSPITAL_COMMUNITY)
Admission: EM | Admit: 2021-02-18 | Discharge: 2021-02-19 | Disposition: A | Discharge status: Home / Self Care | Payer: Medicaid Other | Attending: Emergency Medicine | Admitting: Emergency Medicine

## 2021-02-18 ENCOUNTER — Emergency Department (HOSPITAL_COMMUNITY): Payer: Medicaid Other

## 2021-02-18 ENCOUNTER — Encounter (HOSPITAL_COMMUNITY): Payer: Self-pay

## 2021-02-18 DIAGNOSIS — E119 Type 2 diabetes mellitus without complications: Secondary | ICD-10-CM | POA: Insufficient documentation

## 2021-02-18 DIAGNOSIS — Z79899 Other long term (current) drug therapy: Secondary | ICD-10-CM | POA: Insufficient documentation

## 2021-02-18 DIAGNOSIS — I10 Essential (primary) hypertension: Secondary | ICD-10-CM | POA: Insufficient documentation

## 2021-02-18 DIAGNOSIS — K449 Diaphragmatic hernia without obstruction or gangrene: Secondary | ICD-10-CM | POA: Diagnosis not present

## 2021-02-18 DIAGNOSIS — Z87891 Personal history of nicotine dependence: Secondary | ICD-10-CM | POA: Diagnosis not present

## 2021-02-18 DIAGNOSIS — R112 Nausea with vomiting, unspecified: Secondary | ICD-10-CM

## 2021-02-18 DIAGNOSIS — Z7984 Long term (current) use of oral hypoglycemic drugs: Secondary | ICD-10-CM | POA: Diagnosis not present

## 2021-02-18 DIAGNOSIS — R1012 Left upper quadrant pain: Secondary | ICD-10-CM | POA: Diagnosis present

## 2021-02-18 DIAGNOSIS — Z794 Long term (current) use of insulin: Secondary | ICD-10-CM | POA: Insufficient documentation

## 2021-02-18 LAB — COMPREHENSIVE METABOLIC PANEL
ALT: 16 U/L (ref 0–44)
AST: 18 U/L (ref 15–41)
Albumin: 4 g/dL (ref 3.5–5.0)
Alkaline Phosphatase: 74 U/L (ref 38–126)
Anion gap: 14 (ref 5–15)
BUN: 12 mg/dL (ref 6–20)
CO2: 19 mmol/L — ABNORMAL LOW (ref 22–32)
Calcium: 9.6 mg/dL (ref 8.9–10.3)
Chloride: 102 mmol/L (ref 98–111)
Creatinine, Ser: 0.66 mg/dL (ref 0.44–1.00)
GFR, Estimated: >60 mL/min (ref >60)
Glucose, Bld: 318 mg/dL — ABNORMAL HIGH (ref 70–99)
Potassium: 3.9 mmol/L (ref 3.5–5.1)
Sodium: 135 mmol/L (ref 135–145)
Total Bilirubin: 1.3 mg/dL — ABNORMAL HIGH (ref 0.3–1.2)
Total Protein: 7.9 g/dL (ref 6.5–8.1)

## 2021-02-18 LAB — CBG MONITORING, ED
Glucose-Capillary: 277 mg/dL — ABNORMAL HIGH (ref 70–99)
Glucose-Capillary: 339 mg/dL — ABNORMAL HIGH (ref 70–99)

## 2021-02-18 LAB — URINALYSIS, ROUTINE W REFLEX MICROSCOPIC
Bacteria, UA: NONE SEEN
Bilirubin Urine: NEGATIVE
Glucose, UA: >=500 mg/dL — AB
Ketones, ur: 80 mg/dL — AB
Leukocytes,Ua: NEGATIVE
Nitrite: NEGATIVE
Protein, ur: 30 mg/dL — AB
Specific Gravity, Urine: 1.031 — ABNORMAL HIGH (ref 1.005–1.030)
pH: 6 (ref 5.0–8.0)

## 2021-02-18 LAB — CBC
HCT: 33.4 % — ABNORMAL LOW (ref 36.0–46.0)
Hemoglobin: 10.5 g/dL — ABNORMAL LOW (ref 12.0–15.0)
MCH: 23.9 pg — ABNORMAL LOW (ref 26.0–34.0)
MCHC: 31.4 g/dL (ref 30.0–36.0)
MCV: 76.1 fL — ABNORMAL LOW (ref 80.0–100.0)
Platelets: 432 10*3/uL — ABNORMAL HIGH (ref 150–400)
RBC: 4.39 MIL/uL (ref 3.87–5.11)
RDW: 17.6 % — ABNORMAL HIGH (ref 11.5–15.5)
WBC: 11.2 10*3/uL — ABNORMAL HIGH (ref 4.0–10.5)
nRBC: 0 % (ref 0.0–0.2)

## 2021-02-18 LAB — LIPASE, BLOOD: Lipase: 23 U/L (ref 11–51)

## 2021-02-18 LAB — VALPROIC ACID LEVEL: Valproic Acid Lvl: <10 ug/mL — ABNORMAL LOW (ref 50.0–100.0)

## 2021-02-18 MED ORDER — METOCLOPRAMIDE HCL 5 MG/ML IJ SOLN
10.0000 mg | Freq: Once | INTRAMUSCULAR | Status: AC
  Administered 2021-02-18: 10 mg via INTRAVENOUS
  Filled 2021-02-18: qty 2

## 2021-02-18 MED ORDER — ONDANSETRON HCL 4 MG/2ML IJ SOLN
4.0000 mg | Freq: Once | INTRAMUSCULAR | Status: AC
  Administered 2021-02-18: 4 mg via INTRAVENOUS
  Filled 2021-02-18: qty 2

## 2021-02-18 MED ORDER — SODIUM CHLORIDE 0.9 % IV BOLUS
1000.0000 mL | Freq: Once | INTRAVENOUS | Status: AC
  Administered 2021-02-18: 1000 mL via INTRAVENOUS

## 2021-02-18 MED ORDER — ONDANSETRON 4 MG PO TBDP
4.0000 mg | ORAL_TABLET | Freq: Once | ORAL | Status: AC | PRN
  Administered 2021-02-18: 4 mg via ORAL
  Filled 2021-02-18: qty 1

## 2021-02-18 MED ORDER — PROMETHAZINE HCL 25 MG RE SUPP: 25.0000 mg | Freq: Four times a day (QID) | RECTAL | 0 refills | Status: DC | PRN

## 2021-02-18 MED ORDER — MORPHINE SULFATE (PF) 4 MG/ML IV SOLN
4.0000 mg | Freq: Once | INTRAVENOUS | Status: AC
  Administered 2021-02-18: 4 mg via INTRAVENOUS
  Filled 2021-02-18: qty 1

## 2021-02-18 MED ORDER — IOHEXOL 350 MG/ML SOLN
80.0000 mL | Freq: Once | INTRAVENOUS | Status: AC | PRN
  Administered 2021-02-18: 80 mL via INTRAVENOUS

## 2021-02-18 NOTE — ED Provider Notes (Signed)
Emergency Medicine Provider Triage Evaluation Note  [name] , a 45 y.o. female  was evaluated in triage.  Pt complains of 3 days of nausea, vomiting, lower abdominal pain.  Patient has a history of diabetes and a history of schizoaffective disorder and bipolar disorder.  It appears as though she is on chronic Depakote.  She states that she has never had symptoms like this before.   Review of Systems  Positive: Vomiting, abdominal pain Negative: Fever  Physical Exam  BP (!) 168/88 (BP Location: Left Arm)   Pulse 76   Temp 97.8 F (36.6 C) (Oral)   Resp 18   Ht 6' (1.829 m)   Wt 113.4 kg   LMP 02/18/2021   SpO2 100%   BMI 33.91 kg/m  Gen:   Awake, crying, uncomfortable appearing Resp:  Normal effort  MSK:   Moves extremities without difficulty Abd:   Right lower quadrant and generalized left-sided tenderness.  Medical Decision Making  Medically screening exam initiated at 7:05 PM.  Appropriate orders placed.  [name] was informed that the remainder of the evaluation will be completed by another provider, this initial triage assessment does not replace that evaluation, and the importance of remaining in the ED until their evaluation is complete.     Alyssa Cater, PA-C 02/18/21 1907    Alyssa Dusky, MD 02/18/21 [phone]

## 2021-02-18 NOTE — ED Provider Notes (Signed)
[facility] DEPT Provider Note   CSN: [id number] Arrival date & time: 02/18/21  1832     History Chief Complaint  Patient presents with   Abdominal Pain   Emesis   Hyperglycemia    [name] is a 45 y.o. female pmh DM, IBS, bipolar disorder, schizoaffective disorder and marijuana use disorder presenting today with a complaint nausea, vomiting, abdominal pain that began 3 days ago.  She is able to localize the pain to the left upper quadrant.  She denies diarrhea, constipation or any urinary problems.  She has had multiple visits due to similar complaints in the past few months.  Has not taken anything at home.  Reports that this is never happened.  Denying hematemesis.  Says that her chest hurts when she vomits.  Denies other chest pain, shortness of breath, palpitation, dizziness, syncope.  Reports smoking around "1 blunt a day" endorsing that she smoked this morning around 11 AM. Reports compliance with diabetes medications.     Past Medical History:  Diagnosis Date   Abnormal Pap smear    Bipolar 1 disorder ([place])    Diabetes mellitus without complication (HCC)    Fibroids    Hypertension    IBS (irritable bowel syndrome)     Patient Active Problem List   Diagnosis Date Noted   Essential hypertension 04/08/2019   Class 2 severe obesity due to excess calories with serious comorbidity and body mass index (BMI) of 39.0 to 39.9 in adult ([place]) 04/08/2019   Tobacco dependence 04/08/2019   Schizoaffective disorder, bipolar type ([place]) 05/18/2018   Diabetes mellitus ([place]) 08/10/2016   Bipolar disorder, curr episode mixed, severe, with psychotic features ([employer]) 08/07/2016   Cannabis use disorder, moderate, dependence ([place]) 08/07/2016   CHOLECYSTITIS, UNSPECIFIED 49/44/9675   BILIARY COLIC 91/63/8466   CERUMEN IMPACTION, BILATERAL 01/17/2008   PHARYNGITIS, VIRAL 01/17/2008   NECK PAIN 01/17/2008    Past Surgical History:  Procedure Laterality Date    CERVICAL BIOPSY     CHOLECYSTECTOMY     ESSURE TUBAL LIGATION     HIATAL HERNIA REPAIR     TUBAL LIGATION       OB History     Gravida  7   Para  2   Term  2   Preterm  0   AB  5   Living         SAB  0   IAB  4   Ectopic  1   Multiple      Live Births              Family History  Problem Relation Age of Onset   Diabetes Father    Diabetes Paternal Grandmother    Diabetes Maternal Grandmother    Mental illness Cousin    Healthy Mother     Social History   Tobacco Use   Smoking status: Former    Types: Cigarettes   Smokeless tobacco: Never  Vaping Use   Vaping Use: Some days   Substances: Nicotine, Flavoring  Substance Use Topics   Alcohol use: Not Currently    Comment: occasional   Drug use: Yes    Types: Marijuana    Home Medications Prior to Admission medications   Medication Sig Start Date End Date Taking? Authorizing Provider  Accu-Chek Softclix Lancets lancets Use to check FSBS BID. Dx: E11.42 03/11/20   [name] Bang, MD  ARIPiprazole (ABILIFY) 10 MG tablet Take 1 tablet (10 mg total) by mouth  daily. 11/09/20   Camillia Herter, NP  atorvastatin (LIPITOR) 80 MG tablet Take 1 tablet (80 mg total) by mouth daily. 11/09/20   Camillia Herter, NP  Blood Glucose Monitoring Suppl (ACCU-CHEK GUIDE ME) w/Device KIT Use to check FSBS BID. Dx: E11.42 03/11/20   [name] Bang, MD  carbamide peroxide (DEBROX) 6.5 % OTIC solution Place 5 drops into the left ear 2 (two) times daily. 09/13/20   Wieters, Hallie C, PA-C  dapagliflozin propanediol (FARXIGA) 5 MG TABS tablet Take 1 tablet (5 mg total) by mouth daily before breakfast. 01/14/21   Camillia Herter, NP  divalproex (DEPAKOTE) 250 MG DR tablet Take 1 tablet (250 mg total) by mouth 2 (two) times daily. 11/09/20   Camillia Herter, NP  ibuprofen (ADVIL) 800 MG tablet Take 1 tablet (800 mg total) by mouth 3 (three) times daily with meals. 09/11/20   Vanessa Kick, MD  Insulin Pen  Needle (TRUEPLUS PEN NEEDLES) 32G X 4 MM MISC Use as instructed to inject Victoza daily. 03/11/20   [name] Bang, MD  liraglutide (VICTOZA) 18 MG/3ML SOPN INJECT 1.8 MG UNDER THE SKIN EVERY DAY 11/09/20   Camillia Herter, NP  lisinopril (ZESTRIL) 5 MG tablet Take 1 tablet (5 mg total) by mouth daily. 11/09/20   Camillia Herter, NP  LORazepam (ATIVAN) 1 MG tablet Take 1 tablet (1 mg total) by mouth 3 (three) times daily. 11/09/20   Camillia Herter, NP  metFORMIN (GLUCOPHAGE) 1000 MG tablet Take 1 tablet (1,000 mg total) by mouth 2 (two) times daily with a meal. 01/14/21   Camillia Herter, NP  metoCLOPramide (REGLAN) 10 MG tablet Take 1 tablet (10 mg total) by mouth every 6 (six) hours as needed for nausea or vomiting. 10/27/20   Molpus, Jenny Reichmann, MD  neomycin-polymyxin-hydrocortisone (CORTISPORIN) 3.5-10000-1 OTIC suspension Place 4 drops into the right ear 3 (three) times daily. 09/11/20   Vanessa Kick, MD  promethazine (PHENERGAN) 25 MG suppository Place 1 suppository (25 mg total) rectally every 6 (six) hours as needed for nausea or vomiting. 10/28/20   Tegeler, Gwenyth Allegra, MD    Allergies    Patient has no known allergies.  Review of Systems   Review of Systems  Constitutional:  Negative for chills and fever.  HENT:  Negative for ear pain and sore throat.   Eyes:  Negative for pain and visual disturbance.  Respiratory:  Negative for cough and shortness of breath.   Cardiovascular:  Negative for chest pain and palpitations.  Gastrointestinal:  Positive for abdominal pain, nausea and vomiting. Negative for constipation and diarrhea.  Genitourinary:  Negative for dysuria and hematuria.  Musculoskeletal:  Negative for arthralgias and back pain.  Skin:  Negative for color change and rash.  Neurological:  Negative for dizziness, seizures, syncope, weakness and headaches.  All other systems reviewed and are negative.  Physical Exam Updated Vital Signs BP (!) 163/78   Pulse (!) 59    Temp 97.8 F (36.6 C) (Oral)   Resp 17   Ht 6' (1.829 m)   Wt 113.4 kg   LMP 02/18/2021   SpO2 100%   BMI 33.91 kg/m   Physical Exam Vitals and nursing note reviewed.  Constitutional:      Appearance: Normal appearance.  HENT:     Head: Normocephalic and atraumatic.  Eyes:     General: No scleral icterus.    Conjunctiva/sclera: Conjunctivae normal.  Cardiovascular:     Rate and Rhythm: Normal rate  and regular rhythm.     Heart sounds: No murmur heard. Pulmonary:     Effort: Pulmonary effort is normal. No respiratory distress.  Abdominal:     General: Abdomen is flat. Bowel sounds are normal. There is no distension.     Palpations: Abdomen is soft.     Tenderness: There is abdominal tenderness in the left upper quadrant. There is no guarding.  Skin:    General: Skin is warm and dry.     Findings: No rash.  Neurological:     Mental Status: She is alert.  Psychiatric:        Mood and Affect: Mood normal.     Comments: Abnormal affect    ED Results / Procedures / Treatments   Labs (all labs ordered are listed, but only abnormal results are displayed) Labs Reviewed  CBC - Abnormal; Notable for the following components:      Result Value   WBC 11.2 (*)    Hemoglobin 10.5 (*)    HCT 33.4 (*)    MCV 76.1 (*)    MCH 23.9 (*)    RDW 17.6 (*)    Platelets 432 (*)    All other components within normal limits  URINALYSIS, ROUTINE W REFLEX MICROSCOPIC - Abnormal; Notable for the following components:   Specific Gravity, Urine 1.031 (*)    Glucose, UA >=500 (*)    Hgb urine dipstick MODERATE (*)    Ketones, ur 80 (*)    Protein, ur 30 (*)    All other components within normal limits  CBG MONITORING, ED - Abnormal; Notable for the following components:   Glucose-Capillary 277 (*)    All other components within normal limits  LIPASE, BLOOD  COMPREHENSIVE METABOLIC PANEL  VALPROIC ACID LEVEL  I-STAT BETA HCG BLOOD, ED ([facility], [facility], [facility] ONLY)  CBG MONITORING, ED     EKG None  Radiology No results found.  Procedures Procedures   Medications Ordered in ED Medications  ondansetron (ZOFRAN-ODT) disintegrating tablet 4 mg (4 mg Oral Given 02/18/21 1859)  morphine 4 MG/ML injection 4 mg (4 mg Intravenous Given 02/18/21 2122)  sodium chloride 0.9 % bolus 1,000 mL (1,000 mLs Intravenous New Bag/Given 02/18/21 2117)  ondansetron (ZOFRAN) injection 4 mg (4 mg Intravenous Given 02/18/21 2134)  iohexol (OMNIPAQUE) 350 MG/ML injection 80 mL (80 mLs Intravenous Contrast Given 02/18/21 2231)  metoCLOPramide (REGLAN) injection 10 mg (10 mg Intravenous Given 02/18/21 2250)    ED Course  I have reviewed the triage vital signs and the nursing notes.  Pertinent labs & imaging results that were available during my care of the patient were reviewed by me and considered in my medical decision making (see chart for details).  Patient was evaluated by me multiple times at bedside.  She was difficult to maintain conversation with, likely due to her psychiatric disorders.  Most of my work-up has been based on physical exam.    MDM Rules/Calculators/A&P                         45 year old female presenting with a complaint of abdominal pain, N/V for 3 days.  She has history of IBS and multiple visits for the same complaint.  On my initial evaluation the patient was difficult to obtain a history from due to her wailing and writhing in pain.  I stepped out to speak to the nurse and when I returned only a few minutes later I found the patient  sleeping in the room.  When I reentered the room the patient began screaming and writhing again.  I pursued a normal abdominal pain work-up.  She was given Zofran and IV morphine.  I debated whether or not she needed a CT abdomen however her last one was in April and because she can localize the pain to the left upper quadrant I decided to order the scan.  CT revealed new small esophageal hiatal hernia and an unchanged moderate sized  periumbilical hernia. I do not believe these to be the cause of her symptoms, although she may need to be on a long-term PPI.  NV could be cannabinoid hyperemesis syndrome as patient has visited for the same complaint multiple times and continues to smoke 1 blunt/day.  Last smoked this morning at 11 AM.  Also could be associated with her poorly controlled diabetes.  She showed no signs of DKA clinically nor did her labs suggest this diagnosis.  Patient also noted to have an abnormal affect and difficulty remaining involved in her care.  Slow to respond to questions, potentially due to her schizoaffective disorder.  Possibly due to marijuana use. Depakote level was ordered to determine whether or not the patient has continued taking her medication. Level <10-patient does not seem to be taking. She has been counseled and encouraged to follow-up with her psychiatrist.  Patient was a hard stick resulting in a slower work-up however when she was reevaluated after a dose of morphine she was in NAD.  Patient blood work revealed elevated WBC however it appears this count is chronically elevated.  Hemoglobin noted to be 10.5, Hct 33.4.  Thrombocytosis to 432 however this also appears to be chronically elevated.  Blood glucose 277. Urine with >500 glucose level. Patient reports compliance with her daily regimen. No signs or results suspicious for DKA.  Has been given 2 doses of 4 mg Zofran IV.  She continued with nausea so she was given 10 mg of Reglan IV.  On reassessment patient reports that she is feeling better.  She was p.o. challenged and said that she has no pain or specified nausea chest "it is like a sick feeling."  She began crying saying she is looking for answers but that she may be having symptoms related to her menstrual cycle.` I told patient that we do not always have an answer but that I cannot assure her that she has no emergent causes for her nausea or vomiting.  I told her that it is emergent to  follow-up with GI for this concern.  Also discussed marijuana cessation.  Requesting suppository antiemetics upon discharge. Will send to pharmacy.  Hesitant yet agreeable to discharge.  Daughter will be picking her up.   Final Clinical Impression(s) / ED Diagnoses Final diagnoses:  Non-intractable vomiting with nausea, unspecified vomiting type    Rx / DC Orders Results and diagnoses were explained to the patient. Return precautions discussed in full. Patient had no additional questions and expressed complete understanding.    Darliss Ridgel 02/19/21 0007    Fredia Sorrow, MD 02/23/21 2001

## 2021-02-18 NOTE — ED Notes (Signed)
2 unsuccessful attempts to insert piv.  Charge nurse [place] aware and will attempt to start piv for Probation officer.

## 2021-02-18 NOTE — ED Triage Notes (Signed)
Per EMS- Patient c/o lower abdominal pain, n/V x 3 days.   [id number]

## 2021-02-18 NOTE — ED Notes (Signed)
Pt ambulated to restroom with steady gait.

## 2021-02-19 LAB — HCG, QUANTITATIVE, PREGNANCY: hCG, Beta Chain, Quant, S: <1 m[IU]/mL (ref <5)

## 2021-02-21 ENCOUNTER — Emergency Department (HOSPITAL_BASED_OUTPATIENT_CLINIC_OR_DEPARTMENT_OTHER)
Admission: EM | Admit: 2021-02-21 | Discharge: 2021-02-21 | Disposition: A | Discharge status: Home / Self Care | Payer: Medicaid Other | Attending: Emergency Medicine | Admitting: Emergency Medicine

## 2021-02-21 ENCOUNTER — Other Ambulatory Visit: Payer: Self-pay

## 2021-02-21 ENCOUNTER — Encounter (HOSPITAL_BASED_OUTPATIENT_CLINIC_OR_DEPARTMENT_OTHER): Payer: Self-pay | Admitting: *Deleted

## 2021-02-21 DIAGNOSIS — R1084 Generalized abdominal pain: Secondary | ICD-10-CM | POA: Diagnosis not present

## 2021-02-21 DIAGNOSIS — Z87891 Personal history of nicotine dependence: Secondary | ICD-10-CM | POA: Diagnosis not present

## 2021-02-21 DIAGNOSIS — I10 Essential (primary) hypertension: Secondary | ICD-10-CM | POA: Diagnosis not present

## 2021-02-21 DIAGNOSIS — R112 Nausea with vomiting, unspecified: Secondary | ICD-10-CM

## 2021-02-21 DIAGNOSIS — E119 Type 2 diabetes mellitus without complications: Secondary | ICD-10-CM | POA: Insufficient documentation

## 2021-02-21 DIAGNOSIS — Z79899 Other long term (current) drug therapy: Secondary | ICD-10-CM | POA: Insufficient documentation

## 2021-02-21 DIAGNOSIS — R1012 Left upper quadrant pain: Secondary | ICD-10-CM | POA: Insufficient documentation

## 2021-02-21 DIAGNOSIS — Z794 Long term (current) use of insulin: Secondary | ICD-10-CM | POA: Insufficient documentation

## 2021-02-21 DIAGNOSIS — R1031 Right lower quadrant pain: Secondary | ICD-10-CM | POA: Diagnosis not present

## 2021-02-21 DIAGNOSIS — Z7984 Long term (current) use of oral hypoglycemic drugs: Secondary | ICD-10-CM | POA: Insufficient documentation

## 2021-02-21 LAB — CBC WITH DIFFERENTIAL/PLATELET
Abs Immature Granulocytes: 0.02 10*3/uL (ref 0.00–0.07)
Basophils Absolute: 0.1 10*3/uL (ref 0.0–0.1)
Basophils Relative: 0 %
Eosinophils Absolute: 0 10*3/uL (ref 0.0–0.5)
Eosinophils Relative: 0 %
HCT: 37.6 % (ref 36.0–46.0)
Hemoglobin: 12.1 g/dL (ref 12.0–15.0)
Immature Granulocytes: 0 %
Lymphocytes Relative: 21 %
Lymphs Abs: 2.5 10*3/uL (ref 0.7–4.0)
MCH: 23.7 pg — ABNORMAL LOW (ref 26.0–34.0)
MCHC: 32.2 g/dL (ref 30.0–36.0)
MCV: 73.6 fL — ABNORMAL LOW (ref 80.0–100.0)
Monocytes Absolute: 0.9 10*3/uL (ref 0.1–1.0)
Monocytes Relative: 7 %
Neutro Abs: 8.2 10*3/uL — ABNORMAL HIGH (ref 1.7–7.7)
Neutrophils Relative %: 72 %
Platelets: 592 10*3/uL — ABNORMAL HIGH (ref 150–400)
RBC: 5.11 MIL/uL (ref 3.87–5.11)
RDW: 18.4 % — ABNORMAL HIGH (ref 11.5–15.5)
WBC: 11.6 10*3/uL — ABNORMAL HIGH (ref 4.0–10.5)
nRBC: 0 % (ref 0.0–0.2)

## 2021-02-21 LAB — URINALYSIS, ROUTINE W REFLEX MICROSCOPIC
Bilirubin Urine: NEGATIVE
Glucose, UA: >1000 mg/dL — AB
Ketones, ur: 40 mg/dL — AB
Leukocytes,Ua: NEGATIVE
Nitrite: NEGATIVE
Protein, ur: 100 mg/dL — AB
Specific Gravity, Urine: >1.046 — ABNORMAL HIGH (ref 1.005–1.030)
pH: 6.5 (ref 5.0–8.0)

## 2021-02-21 LAB — COMPREHENSIVE METABOLIC PANEL
ALT: 14 U/L (ref 0–44)
AST: 12 U/L — ABNORMAL LOW (ref 15–41)
Albumin: 4.1 g/dL (ref 3.5–5.0)
Alkaline Phosphatase: 76 U/L (ref 38–126)
Anion gap: 14 (ref 5–15)
BUN: 11 mg/dL (ref 6–20)
CO2: 20 mmol/L — ABNORMAL LOW (ref 22–32)
Calcium: 9.6 mg/dL (ref 8.9–10.3)
Chloride: 97 mmol/L — ABNORMAL LOW (ref 98–111)
Creatinine, Ser: 0.64 mg/dL (ref 0.44–1.00)
GFR, Estimated: >60 mL/min (ref >60)
Glucose, Bld: 307 mg/dL — ABNORMAL HIGH (ref 70–99)
Potassium: 3.5 mmol/L (ref 3.5–5.1)
Sodium: 131 mmol/L — ABNORMAL LOW (ref 135–145)
Total Bilirubin: 1.3 mg/dL — ABNORMAL HIGH (ref 0.3–1.2)
Total Protein: 7.8 g/dL (ref 6.5–8.1)

## 2021-02-21 LAB — CBG MONITORING, ED: Glucose-Capillary: 250 mg/dL — ABNORMAL HIGH (ref 70–99)

## 2021-02-21 LAB — LIPASE, BLOOD: Lipase: 22 U/L (ref 11–51)

## 2021-02-21 LAB — PREGNANCY, URINE: Preg Test, Ur: NEGATIVE

## 2021-02-21 MED ORDER — METOCLOPRAMIDE HCL 5 MG/ML IJ SOLN
10.0000 mg | Freq: Once | INTRAMUSCULAR | Status: AC
  Administered 2021-02-21: 10 mg via INTRAVENOUS
  Filled 2021-02-21: qty 2

## 2021-02-21 MED ORDER — MORPHINE SULFATE (PF) 4 MG/ML IV SOLN
4.0000 mg | Freq: Once | INTRAVENOUS | Status: AC
Start: 2021-02-21 — End: 2021-02-21
  Administered 2021-02-21: 4 mg via INTRAVENOUS
  Filled 2021-02-21: qty 1

## 2021-02-21 MED ORDER — INSULIN ASPART 100 UNIT/ML IJ SOLN
6.0000 [IU] | Freq: Once | INTRAMUSCULAR | Status: AC
  Administered 2021-02-21: 6 [IU] via SUBCUTANEOUS

## 2021-02-21 MED ORDER — KETOROLAC TROMETHAMINE 30 MG/ML IJ SOLN
30.0000 mg | Freq: Once | INTRAMUSCULAR | Status: AC
  Administered 2021-02-21: 30 mg via INTRAVENOUS
  Filled 2021-02-21: qty 1

## 2021-02-21 MED ORDER — METOCLOPRAMIDE HCL 10 MG PO TABS: 10.0000 mg | ORAL_TABLET | Freq: Four times a day (QID) | ORAL | 0 refills | Status: DC | PRN

## 2021-02-21 MED ORDER — SODIUM CHLORIDE 0.9 % IV BOLUS
500.0000 mL | Freq: Once | INTRAVENOUS | Status: AC
  Administered 2021-02-21: 500 mL via INTRAVENOUS

## 2021-02-21 NOTE — ED Notes (Signed)
Pt stated that the pain medication was effective and she feels much better.

## 2021-02-21 NOTE — ED Notes (Signed)
Pt did well with PO challenge and staed that she feels much better

## 2021-02-21 NOTE — ED Notes (Signed)
RN called patient's daughter with patient permission. Patient's daughter states she will be here in 20 minutes to pick patient up

## 2021-02-21 NOTE — Discharge Instructions (Addendum)
You were seen in the emergency department for recurrent nausea vomiting and abdominal pain.  Your lab work did not show any significant abnormalities other than some signs of dehydration.  Please start with a clear liquid diet and advance as tolerated.  Consider stopping marijuana as this may be a cause of your symptoms.  Please follow-up with gastroenterology.

## 2021-02-21 NOTE — ED Triage Notes (Addendum)
Pt arrived via ems with c/o of vomiting for past 4-5 days. Was seen at [facility] on 8-19 for same. Denies diarrhea or fevers.Pt is a diabetic and has not been taking her metormin. Blood sugar is 316. Pt c/o lower bilateral abd pain that hurts worse with movement. C/o urinary frequency. Pt was given zofran on arrival to ED

## 2021-02-21 NOTE — ED Notes (Signed)
Fluids given.

## 2021-02-21 NOTE — ED Provider Notes (Signed)
[place] EMERGENCY DEPT Provider Note   CSN: [id number] Arrival date & time: 02/21/21  0759     History Chief Complaint  Patient presents with   Emesis    [name] is a 45 y.o. female.  She is here by EMS for the complaint of nausea vomiting and abdominal pain that is going on for the last 6 days.  She was at [facility] 3 days ago for same and had a normal CT and fairly normal labs.  She said that this is happened before when she has her period.  Denies any fever.  No chest pain or shortness of breath.  The abdominal pain is both in her right lower quadrant and her left upper quadrant and crampy in nature.  She denies any blood in the vomitus.  She denies any diarrhea or constipation or urinary symptoms.  She has been referred to GI but she has not seen them yet.  She is tearful.  The history is provided by the patient and the EMS personnel.  Emesis Severity:  Moderate Duration:  6 days Timing:  Intermittent Quality:  Unable to specify Progression:  Unchanged Chronicity:  Recurrent Recent urination:  Normal Relieved by:  Nothing Worsened by:  Nothing Ineffective treatments:  Antiemetics Associated symptoms: abdominal pain   Associated symptoms: no cough, no diarrhea, no fever, no headaches and no sore throat   Abdominal pain:    Location:  RLQ and LUQ   Quality: cramping     Severity:  Severe   Onset quality:  Gradual   Duration:  6 days   Timing:  Intermittent   Progression:  Unchanged   Chronicity:  Recurrent     Past Medical History:  Diagnosis Date   Abnormal Pap smear    Bipolar 1 disorder ([place])    Diabetes mellitus without complication ([place])    Fibroids    Hypertension    IBS (irritable bowel syndrome)     Patient Active Problem List   Diagnosis Date Noted   Essential hypertension 04/08/2019   Class 2 severe obesity due to excess calories with serious comorbidity and body mass index (BMI) of 39.0 to 39.9 in adult ([place]) 04/08/2019    Tobacco dependence 04/08/2019   Schizoaffective disorder, bipolar type ([place]) 05/18/2018   Diabetes mellitus ([place]) 08/10/2016   Bipolar disorder, curr episode mixed, severe, with psychotic features ([place]) 08/07/2016   Cannabis use disorder, moderate, dependence ([place]) 08/07/2016   CHOLECYSTITIS, UNSPECIFIED 37/62/8315   BILIARY COLIC 17/61/6073   CERUMEN IMPACTION, BILATERAL 01/17/2008   PHARYNGITIS, VIRAL 01/17/2008   NECK PAIN 01/17/2008    Past Surgical History:  Procedure Laterality Date   CERVICAL BIOPSY     CHOLECYSTECTOMY     ESSURE TUBAL LIGATION     HIATAL HERNIA REPAIR     TUBAL LIGATION       OB History     Gravida  7   Para  2   Term  2   Preterm  0   AB  5   Living         SAB  0   IAB  4   Ectopic  1   Multiple      Live Births              Family History  Problem Relation Age of Onset   Diabetes Father    Diabetes Paternal Grandmother    Diabetes Maternal Grandmother    Mental illness Cousin    Healthy Mother  Social History   Tobacco Use   Smoking status: Former    Types: Cigarettes   Smokeless tobacco: Never  Vaping Use   Vaping Use: Some days   Substances: Nicotine, Flavoring  Substance Use Topics   Alcohol use: Not Currently    Comment: occasional   Drug use: Yes    Types: Marijuana    Home Medications Prior to Admission medications   Medication Sig Start Date End Date Taking? Authorizing Provider  Accu-Chek Softclix Lancets lancets Use to check FSBS BID. Dx: E11.42 03/11/20   [name] Bang, MD  ARIPiprazole (ABILIFY) 10 MG tablet Take 1 tablet (10 mg total) by mouth daily. 11/09/20   Camillia Herter, NP  atorvastatin (LIPITOR) 80 MG tablet Take 1 tablet (80 mg total) by mouth daily. 11/09/20   Camillia Herter, NP  Blood Glucose Monitoring Suppl (ACCU-CHEK GUIDE ME) w/Device KIT Use to check FSBS BID. Dx: E11.42 03/11/20   [name] Bang, MD  carbamide peroxide (DEBROX) 6.5 % OTIC solution Place 5  drops into the left ear 2 (two) times daily. 09/13/20   Wieters, Hallie C, PA-C  dapagliflozin propanediol (FARXIGA) 5 MG TABS tablet Take 1 tablet (5 mg total) by mouth daily before breakfast. 01/14/21   Camillia Herter, NP  divalproex (DEPAKOTE) 250 MG DR tablet Take 1 tablet (250 mg total) by mouth 2 (two) times daily. 11/09/20   Camillia Herter, NP  ibuprofen (ADVIL) 800 MG tablet Take 1 tablet (800 mg total) by mouth 3 (three) times daily with meals. 09/11/20   Vanessa Kick, MD  Insulin Pen Needle (TRUEPLUS PEN NEEDLES) 32G X 4 MM MISC Use as instructed to inject Victoza daily. 03/11/20   [name] Bang, MD  liraglutide (VICTOZA) 18 MG/3ML SOPN INJECT 1.8 MG UNDER THE SKIN EVERY DAY 11/09/20   Camillia Herter, NP  lisinopril (ZESTRIL) 5 MG tablet Take 1 tablet (5 mg total) by mouth daily. 11/09/20   Camillia Herter, NP  LORazepam (ATIVAN) 1 MG tablet Take 1 tablet (1 mg total) by mouth 3 (three) times daily. 11/09/20   Camillia Herter, NP  metFORMIN (GLUCOPHAGE) 1000 MG tablet Take 1 tablet (1,000 mg total) by mouth 2 (two) times daily with a meal. 01/14/21   Camillia Herter, NP  metoCLOPramide (REGLAN) 10 MG tablet Take 1 tablet (10 mg total) by mouth every 6 (six) hours as needed for nausea or vomiting. 10/27/20   Molpus, Jenny Reichmann, MD  neomycin-polymyxin-hydrocortisone (CORTISPORIN) 3.5-10000-1 OTIC suspension Place 4 drops into the right ear 3 (three) times daily. 09/11/20   Vanessa Kick, MD  promethazine (PHENERGAN) 25 MG suppository Place 1 suppository (25 mg total) rectally every 6 (six) hours as needed for nausea or vomiting. 02/18/21   Redwine, Madison A, PA-C    Allergies    Patient has no known allergies.  Review of Systems   Review of Systems  Constitutional:  Negative for fever.  HENT:  Negative for sore throat.   Eyes:  Negative for visual disturbance.  Respiratory:  Negative for cough.   Cardiovascular:  Negative for chest pain.  Gastrointestinal:  Positive for abdominal pain,  nausea and vomiting. Negative for diarrhea.  Genitourinary:  Negative for dysuria.  Musculoskeletal:  Negative for neck pain.  Skin:  Negative for rash.  Neurological:  Negative for headaches.   Physical Exam Updated Vital Signs BP (!) 173/96 (BP Location: Right Arm)   Pulse 75   Temp 97.7 F (36.5 C) (Oral)  Resp 18   Wt 113.4 kg   LMP 02/18/2021 (Approximate)   SpO2 100%   BMI 33.91 kg/m   Physical Exam Vitals and nursing note reviewed.  Constitutional:      General: She is not in acute distress.    Appearance: Normal appearance. She is well-developed.  HENT:     Head: Normocephalic and atraumatic.  Eyes:     Conjunctiva/sclera: Conjunctivae normal.  Cardiovascular:     Rate and Rhythm: Normal rate and regular rhythm.     Heart sounds: No murmur heard. Pulmonary:     Effort: Pulmonary effort is normal. No respiratory distress.     Breath sounds: Normal breath sounds.  Abdominal:     Palpations: Abdomen is soft.     Tenderness: There is abdominal tenderness (generalized). There is no guarding or rebound.  Musculoskeletal:        General: No deformity or signs of injury. Normal range of motion.     Cervical back: Neck supple.  Skin:    General: Skin is warm and dry.  Neurological:     General: No focal deficit present.     Mental Status: She is alert.    ED Results / Procedures / Treatments   Labs (all labs ordered are listed, but only abnormal results are displayed) Labs Reviewed  COMPREHENSIVE METABOLIC PANEL - Abnormal; Notable for the following components:      Result Value   Sodium 131 (*)    Chloride 97 (*)    CO2 20 (*)    Glucose, Bld 307 (*)    AST 12 (*)    Total Bilirubin 1.3 (*)    All other components within normal limits  CBC WITH DIFFERENTIAL/PLATELET - Abnormal; Notable for the following components:   WBC 11.6 (*)    MCV 73.6 (*)    MCH 23.7 (*)    RDW 18.4 (*)    Platelets 592 (*)    Neutro Abs 8.2 (*)    All other components within  normal limits  URINALYSIS, ROUTINE W REFLEX MICROSCOPIC - Abnormal; Notable for the following components:   Specific Gravity, Urine >1.046 (*)    Glucose, UA >1,000 (*)    Hgb urine dipstick SMALL (*)    Ketones, ur 40 (*)    Protein, ur 100 (*)    Bacteria, UA MANY (*)    All other components within normal limits  CBG MONITORING, ED - Abnormal; Notable for the following components:   Glucose-Capillary 250 (*)    All other components within normal limits  LIPASE, BLOOD  PREGNANCY, URINE    EKG None  Radiology No results found.  Procedures Procedures   Medications Ordered in ED Medications  metoCLOPramide (REGLAN) injection 10 mg (has no administration in time range)  sodium chloride 0.9 % bolus 500 mL (has no administration in time range)  morphine 4 MG/ML injection 4 mg (has no administration in time range)  ketorolac (TORADOL) 30 MG/ML injection 30 mg (has no administration in time range)    ED Course  I have reviewed the triage vital signs and the nursing notes.  Pertinent labs & imaging results that were available during my care of the patient were reviewed by me and considered in my medical decision making (see chart for details).  Clinical Course as of 02/21/21 1730  Mon Feb 21, 2021  1027 Reassessed patient, she looks much more comfortable.  She does have some ketones in her urine and her bicarb is a little  down although she is got a normal gap. [MB]  9379 Patient symptoms are improved and she is tolerated p.o. [MB]    Clinical Course User Index [MB] Hayden Rasmussen, MD   MDM Rules/Calculators/A&P                          This patient complains of generalized abdominal pain nausea and vomiting; this involves an extensive number of treatment Options and is a complaint that carries with it a high risk of complications and Morbidity. The differential includes cannabis hyperemesis syndrome, gastritis, gastroparesis, obstruction, dehydration  I ordered,  reviewed and interpreted labs, which included CBC with mildly elevated white count similar to priors, normal chemistries, platelets elevated similar to priors, chemistries with low sodium low bicarb and elevated glucose reflecting some dehydration and hyperglycemia, normal LFTs, urinalysis with some ketones, normal.  Blood sugar improved after fluids I ordered medication IV fluids and pain medication nausea medication. Previous records obtained and reviewed including prior ED records.  She just had a CAT scan just 3 days ago so do not feel needs repeating  After the interventions stated above, I reevaluated the patient and found patient to be symptomatically improved and hemodynamically stable.  Recommended marijuana cessation and will refill prescription for Reglan.  Recommended follow-up with GI as well as recommended before.  Return instructions discussed   Final Clinical Impression(s) / ED Diagnoses Final diagnoses:  Non-intractable vomiting with nausea, unspecified vomiting type  Generalized abdominal pain    Rx / DC Orders ED Discharge Orders          Ordered    metoCLOPramide (REGLAN) 10 MG tablet  Every 6 hours PRN        02/21/21 1355             Hayden Rasmussen, MD 02/21/21 1733

## 2021-02-21 NOTE — ED Notes (Signed)
[place] Daughter

## 2021-03-09 ENCOUNTER — Other Ambulatory Visit: Payer: Self-pay

## 2021-03-09 ENCOUNTER — Ambulatory Visit (INDEPENDENT_AMBULATORY_CARE_PROVIDER_SITE_OTHER): Payer: Medicaid Other | Admitting: Family

## 2021-03-09 ENCOUNTER — Other Ambulatory Visit (HOSPITAL_COMMUNITY)
Admission: RE | Admit: 2021-03-09 | Discharge: 2021-03-09 | Disposition: A | Discharge status: Home / Self Care | Payer: Medicaid Other | Source: Ambulatory Visit | Attending: Family | Admitting: Family

## 2021-03-09 ENCOUNTER — Encounter: Payer: Self-pay | Admitting: Family

## 2021-03-09 VITALS — BP 130/87 | HR 108 | Temp 98.4°F | Resp 18

## 2021-03-09 DIAGNOSIS — E1142 Type 2 diabetes mellitus with diabetic polyneuropathy: Secondary | ICD-10-CM

## 2021-03-09 DIAGNOSIS — N898 Other specified noninflammatory disorders of vagina: Secondary | ICD-10-CM | POA: Insufficient documentation

## 2021-03-09 DIAGNOSIS — I10 Essential (primary) hypertension: Secondary | ICD-10-CM

## 2021-03-09 DIAGNOSIS — R110 Nausea: Secondary | ICD-10-CM

## 2021-03-09 DIAGNOSIS — R350 Frequency of micturition: Secondary | ICD-10-CM | POA: Diagnosis not present

## 2021-03-09 DIAGNOSIS — R1110 Vomiting, unspecified: Secondary | ICD-10-CM

## 2021-03-09 LAB — POCT URINALYSIS DIP (CLINITEK)
Bilirubin, UA: NEGATIVE
Blood, UA: NEGATIVE
Glucose, UA: 250 mg/dL — AB
Ketones, POC UA: NEGATIVE mg/dL
Leukocytes, UA: NEGATIVE
Nitrite, UA: NEGATIVE
Spec Grav, UA: >=1.03 — AB (ref 1.010–1.025)
Urobilinogen, UA: 0.2 E.U./dL
pH, UA: 5.5 (ref 5.0–8.0)

## 2021-03-09 LAB — POCT GLYCOSYLATED HEMOGLOBIN (HGB A1C): Hemoglobin A1C: 8.7 % — AB (ref 4.0–5.6)

## 2021-03-09 MED ORDER — VICTOZA 18 MG/3ML ~~LOC~~ SOPN: PEN_INJECTOR | SUBCUTANEOUS | 0 refills | Status: DC

## 2021-03-09 MED ORDER — TRUE METRIX BLOOD GLUCOSE TEST VI STRP: ORAL_STRIP | 12 refills | Status: DC

## 2021-03-09 MED ORDER — DAPAGLIFLOZIN PROPANEDIOL 10 MG PO TABS: 10.0000 mg | ORAL_TABLET | Freq: Every day | ORAL | 0 refills | Status: DC

## 2021-03-09 MED ORDER — TRUE METRIX METER W/DEVICE KIT: PACK | 0 refills | Status: DC

## 2021-03-09 MED ORDER — LISINOPRIL 5 MG PO TABS: 5.0000 mg | ORAL_TABLET | Freq: Every day | ORAL | 0 refills | Status: DC

## 2021-03-09 MED ORDER — METFORMIN HCL 1000 MG PO TABS: 1000.0000 mg | ORAL_TABLET | Freq: Two times a day (BID) | ORAL | 0 refills | Status: DC

## 2021-03-09 MED ORDER — TRUEPLUS LANCETS 28G MISC: 1 refills | Status: DC

## 2021-03-09 NOTE — Progress Notes (Signed)
  Patient ID: [name], female    DOB: 04/14/1976  MRN: [id number]  CC: Diabetes and Hypertension Follow-Up  Subjective: [name] is a 45 y.o. female who presents for diabetes and hypertension follow-up.   Her concerns today include:   1. Type 2 diabetes mellitus follow-up: 11/08/2020: - Last hemoglobin A1c not at goal at  8.9% on 08/19/2020, goal < 7%.  - Continue Metformin, Liraglutide, and Dapagliflozin Propanediol as prescribed. Courtesy refill for 30 days.  03/09/2021: Doing well on current medications. Still having neuropathy in bilateral feet. Took Gabapentin in the past and made worse.    2. Essential hypertension follow-up: 11/08/2020: - Continue Lisinopril as prescribed. Courtesy refill for 30 days.   03/09/2021: Doing well on current regimen. No side effects. No issues/concerns.   3. Hyperlipidemia follow-up: 11/08/2020: - Continue Atorvastatin as prescribed.   03/09/2021: Requesting refills.   4. Chronic nausea and vomiting: Ongoing for several months. Seen at the local urgent care several times. Was told related to smoking tobacco, marijuana, vaping, and taking gummies. Since then has quit smoking. Would like referral to Gastroenterology.   5. Vaginal itching: Requesting screening.   Patient Active Problem List   Diagnosis Date Noted   Essential hypertension 04/08/2019   Class 2 severe obesity due to excess calories with serious comorbidity and body mass index (BMI) of 39.0 to 39.9 in adult ([facility]) 04/08/2019   Tobacco dependence 04/08/2019   Schizoaffective disorder, bipolar type ([facility]) 05/18/2018   Diabetes mellitus ([facility]) 08/10/2016   Bipolar disorder, curr episode mixed, severe, with psychotic features ([facility]) 08/07/2016   Cannabis use disorder, moderate, dependence ([facility]) 08/07/2016   CHOLECYSTITIS, UNSPECIFIED 06/12/2008   BILIARY COLIC 06/10/2008   CERUMEN IMPACTION, BILATERAL 01/17/2008   PHARYNGITIS, VIRAL 01/17/2008   NECK PAIN 01/17/2008     Current  Outpatient Medications on File Prior to Visit  Medication Sig Dispense Refill   Accu-Chek Softclix Lancets lancets Use to check FSBS BID. Dx: E11.42 100 each 2   ARIPiprazole (ABILIFY) 10 MG tablet Take 1 tablet (10 mg total) by mouth daily. 30 tablet 0   atorvastatin (LIPITOR) 80 MG tablet Take 1 tablet (80 mg total) by mouth daily. 90 tablet 0   Blood Glucose Monitoring Suppl (ACCU-CHEK GUIDE ME) w/Device KIT Use to check FSBS BID. Dx: E11.42 1 kit 0   carbamide peroxide (DEBROX) 6.5 % OTIC solution Place 5 drops into the left ear 2 (two) times daily. 15 mL 0   divalproex (DEPAKOTE) 250 MG DR tablet Take 1 tablet (250 mg total) by mouth 2 (two) times daily. 60 tablet 0   ibuprofen (ADVIL) 800 MG tablet Take 1 tablet (800 mg total) by mouth 3 (three) times daily with meals. 21 tablet 0   Insulin Pen Needle (TRUEPLUS PEN NEEDLES) 32G X 4 MM MISC Use as instructed to inject Victoza daily. 100 each 2   LORazepam (ATIVAN) 1 MG tablet Take 1 tablet (1 mg total) by mouth 3 (three) times daily. 90 tablet 0   metoCLOPramide (REGLAN) 10 MG tablet Take 1 tablet (10 mg total) by mouth every 6 (six) hours as needed for nausea or vomiting. 12 tablet 0   neomycin-polymyxin-hydrocortisone (CORTISPORIN) 3.5-10000-1 OTIC suspension Place 4 drops into the right ear 3 (three) times daily. 10 mL 0   promethazine (PHENERGAN) 25 MG suppository Place 1 suppository (25 mg total) rectally every 6 (six) hours as needed for nausea or vomiting. 12 each 0   No current facility-administered medications on file prior to   visit.    No Known Allergies  Social History   Socioeconomic History   Marital status: Single    Spouse name: Not on file   Number of children: 2   Years of education: Not on file   Highest education level: Master's degree (e.g., MA, MS, MEng, MEd, MSW, MBA)  Occupational History   Not on file  Tobacco Use   Smoking status: Former    Types: Cigarettes   Smokeless tobacco: Never  Vaping Use    Vaping Use: Some days   Substances: Nicotine, Flavoring  Substance and Sexual Activity   Alcohol use: Not Currently    Comment: occasional   Drug use: Yes    Types: Marijuana   Sexual activity: Not Currently    Birth control/protection: Surgical  Other Topics Concern   Not on file  Social History Narrative   ** Merged History Encounter **       Social Determinants of Health   Financial Resource Strain: Not on file  Food Insecurity: Not on file  Transportation Needs: Not on file  Physical Activity: Not on file  Stress: Not on file  Social Connections: Not on file  Intimate Partner Violence: Not on file    Family History  Problem Relation Age of Onset   Diabetes Father    Diabetes Paternal Grandmother    Diabetes Maternal Grandmother    Mental illness Cousin    Healthy Mother     Past Surgical History:  Procedure Laterality Date   CERVICAL BIOPSY     CHOLECYSTECTOMY     ESSURE TUBAL LIGATION     HIATAL HERNIA REPAIR     TUBAL LIGATION      ROS: Review of Systems Negative except as stated above  PHYSICAL EXAM: BP 130/87 (BP Location: Left Arm, Patient Position: Sitting, Cuff Size: Large)   Pulse (!) 108   Temp 98.4 F (36.9 C)   Resp 18   LMP 02/18/2021 (Approximate)   SpO2 98%   Physical Exam HENT:     Head: Normocephalic and atraumatic.  Eyes:     Extraocular Movements: Extraocular movements intact.     Conjunctiva/sclera: Conjunctivae normal.     Pupils: Pupils are equal, round, and reactive to light.  Cardiovascular:     Rate and Rhythm: Regular rhythm. Tachycardia present.     Pulses: Normal pulses.     Heart sounds: Normal heart sounds.  Pulmonary:     Effort: Pulmonary effort is normal.     Breath sounds: Normal breath sounds.  Musculoskeletal:     Cervical back: Normal range of motion and neck supple.  Neurological:     General: No focal deficit present.     Mental Status: She is alert and oriented to person, place, and time.   Psychiatric:        Mood and Affect: Mood normal.        Behavior: Behavior normal.   Results for orders placed or performed in visit on 03/09/21  POCT glycosylated hemoglobin (Hb A1C)  Result Value Ref Range   Hemoglobin A1C 8.7 (A) 4.0 - 5.6 %   HbA1c POC (<> result, manual entry)     HbA1c, POC (prediabetic range)     HbA1c, POC (controlled diabetic range)    POCT URINALYSIS DIP (CLINITEK)  Result Value Ref Range   Color, UA yellow yellow   Clarity, UA cloudy (A) clear   Glucose, UA =250 (A) negative mg/dL   Bilirubin, UA negative negative   Ketones,   POC UA negative negative mg/dL   Spec Grav, UA >=1.030 (A) 1.010 - 1.025   Blood, UA negative negative   pH, UA 5.5 5.0 - 8.0   POC PROTEIN,UA trace negative, trace   Urobilinogen, UA 0.2 0.2 or 1.0 E.U./dL   Nitrite, UA Negative Negative   Leukocytes, UA Negative Negative    ASSESSMENT AND PLAN: 1. Type 2 diabetes mellitus with diabetic polyneuropathy, without long-term current use of insulin ([place]): - Hemoglobin A1c today not at goal at 8.7%, goal < 7%. This is decreased from previous hemoglobin A1c of 8.9% on 08/19/2020.  - Continue Metformin and Liraglutide as prescribed.  - Increase Dapagliflozin Propanediol from 5 mg daily to 10 mg daily.  - Discussed the importance of healthy eating habits, low-carbohydrate diet, low-sugar diet, regular aerobic exercise (at least 150 minutes a week as tolerated) and medication compliance to achieve or maintain control of diabetes. - To achieve an A1C goal of less than or equal to 7.0 percent, a fasting blood sugar of 80 to 130 mg/dL and a postprandial glucose (90 to 120 minutes after a meal) less than 180 mg/dL. In the event of sugars less than 60 mg/dl or greater than 400 mg/dl please notify the clinic ASAP. It is recommended that you undergo annual eye exams and annual foot exams. - History of diabetic neuropathy. Gabapentin makes worse. Referral to Podiatry for further evaluation and  management. - Follow-up with primary provider in 4 weeks or sooner if needed.  - dapagliflozin propanediol (FARXIGA) 10 MG TABS tablet; Take 1 tablet (10 mg total) by mouth daily before breakfast.  Dispense: 90 tablet; Refill: 0 - liraglutide (VICTOZA) 18 MG/3ML SOPN; INJECT 1.8 MG UNDER THE SKIN EVERY DAY  Dispense: 9 mL; Refill: 0 - metFORMIN (GLUCOPHAGE) 1000 MG tablet; Take 1 tablet (1,000 mg total) by mouth 2 (two) times daily with a meal.  Dispense: 180 tablet; Refill: 0 - TRUEplus Lancets 28G MISC; Use as directed  Dispense: 100 each; Refill: 1 - glucose blood (TRUE METRIX BLOOD GLUCOSE TEST) test strip; Use as instructed  Dispense: 100 each; Refill: 12 - Blood Glucose Monitoring Suppl (TRUE METRIX METER) w/Device KIT; Use as directed  Dispense: 1 kit; Refill: 0 - Ambulatory referral to Podiatry - POCT glycosylated hemoglobin (Hb A1C)  2. Essential hypertension: - Continue Lisinopril as prescribed.  - Counseled on blood pressure goal of less than 130/80, low-sodium, DASH diet, medication compliance, 150 minutes of moderate intensity exercise per week as tolerated. Discussed medication compliance, adverse effects. - Follow-up with primary provider in 3 months or sooner if needed.  - lisinopril (ZESTRIL) 5 MG tablet; Take 1 tablet (5 mg total) by mouth daily.  Dispense: 90 tablet; Refill: 0  3. Frequent urination: - Urinalysis negative for nitrites and leukocytes.  - Urine culture for further evaluation.  - POCT URINALYSIS DIP (CLINITEK) - Urine Culture  4. Vaginal itching: - Cervicovaginal self-swab to screen for chlamydia, gonorrhea, trichomonas, bacterial vaginitis, and candida vaginitis. - Urine culture for further evaluation.  - Cervicovaginal ancillary only - Urine Culture  5. Chronic nausea: 6. Chronic vomiting: - Ongoing for several months. Seen at the local urgent care several times. Was told related to smoking tobacco, marijuana, vaping, and taking gummies. Since then  has quit smoking. Would like referral to Gastroenterology.  - Per patient request referral to Gastroenterology for further evaluation and management.  - Follow-up with primary provider as scheduled.  - Ambulatory referral to Gastroenterology    Patient was given the  opportunity to ask questions.  Patient verbalized understanding of the plan and was able to repeat key elements of the plan. Patient was given clear instructions to go to Emergency Department or return to medical center if symptoms don't improve, worsen, or new problems develop.The patient verbalized understanding.   Orders Placed This Encounter  Procedures   Urine Culture   Ambulatory referral to Gastroenterology   Ambulatory referral to Podiatry   POCT glycosylated hemoglobin (Hb A1C)   POCT URINALYSIS DIP (CLINITEK)     Requested Prescriptions   Signed Prescriptions Disp Refills   dapagliflozin propanediol (FARXIGA) 10 MG TABS tablet 90 tablet 0    Sig: Take 1 tablet (10 mg total) by mouth daily before breakfast.   Blood Glucose Monitoring Suppl (TRUE METRIX METER) w/Device KIT 1 kit 0    Sig: Use as directed   liraglutide (VICTOZA) 18 MG/3ML SOPN 9 mL 0    Sig: INJECT 1.8 MG UNDER THE SKIN EVERY DAY   metFORMIN (GLUCOPHAGE) 1000 MG tablet 180 tablet 0    Sig: Take 1 tablet (1,000 mg total) by mouth 2 (two) times daily with a meal.   lisinopril (ZESTRIL) 5 MG tablet 90 tablet 0    Sig: Take 1 tablet (5 mg total) by mouth daily.   TRUEplus Lancets 28G MISC 100 each 1    Sig: Use as directed   glucose blood (TRUE METRIX BLOOD GLUCOSE TEST) test strip 100 each 12    Sig: Use as instructed    Return in about 4 weeks (around 04/06/2021) for Follow-Up or next available diabetes and 3 months hypertension .  Camillia Herter, NP

## 2021-03-09 NOTE — Progress Notes (Signed)
Diabetes and urinalysis discussed in office.

## 2021-03-09 NOTE — Progress Notes (Signed)
Pt presents for diabetes follow-up, pt reports frequent urination and mild vaginal itching needs refills on Atorvastatin, farxiga, insulin pen needles, lancets, Victoza, lisinopril, and metformin

## 2021-03-11 LAB — CERVICOVAGINAL ANCILLARY ONLY

## 2021-03-11 LAB — URINE CULTURE: Organism ID, Bacteria: NO GROWTH

## 2021-03-11 NOTE — Progress Notes (Signed)
Please call patient with update.   Gonorrhea, Chlamydia, Trichomonas, Bacterial Vaginitis, and Candida Vaginitis (sometimes called yeast infection) negative.

## 2021-03-11 NOTE — Progress Notes (Signed)
Urine culture normal.

## 2021-03-11 NOTE — Progress Notes (Signed)
Please call patient with update.   Urine culture normal.

## 2021-03-17 ENCOUNTER — Ambulatory Visit: Payer: Medicaid Other | Admitting: Podiatry

## 2021-04-03 NOTE — Progress Notes (Signed)
Patient ID: [name], female    DOB: Dec 06, 1975  MRN: [id number]  CC: Diabetes Follow-Up   Subjective: [name] is a 45 y.o. female who presents for diabetes follow-up.   Her concerns today include:   DIABETES TYPE 2 FOLLOW-UP: 03/09/2021: - Hemoglobin A1c today not at goal at 8.7%, goal < 7%. This is decreased from previous hemoglobin A1c of 8.9% on 08/19/2020.  - Continue Metformin and Liraglutide as prescribed.  - Increase Dapagliflozin Propanediol from 5 mg daily to 10 mg daily.   04/08/2021: Doing well on current regimen. No side effects. No issues/concerns.   2. EYE CONCERNS: Reports began 4 days ago with draining and increased crust in the eyes. Reports green cake-like substance when blinking. Usually worse when looking at phone screen or laptop screen. Reports no symptoms today and thinks related to not being on phone and laptop as much. She does not wear glasses or contacts. Denies wearing eyelash extensions and eye makeup. Has not taken anything over the counter. Reports she contacted [facility] and they recommended purchasing over-the-counter blue light glasses. Would like referral to eye doctor for routine exam. Reports last eye doctor appointment was 8 years ago.  3. VAGINAL ITCHING: Denies any additional genitourinary symptoms.   Patient Active Problem List   Diagnosis Date Noted   Essential hypertension 04/08/2019   Class 2 severe obesity due to excess calories with serious comorbidity and body mass index (BMI) of 39.0 to 39.9 in adult ([place]) 04/08/2019   Tobacco dependence 04/08/2019   Schizoaffective disorder, bipolar type ([place]) 05/18/2018   Diabetes mellitus ([place]) 08/10/2016   Bipolar disorder, curr episode mixed, severe, with psychotic features ([place]) 08/07/2016   Cannabis use disorder, moderate, dependence ([place]) 08/07/2016   CHOLECYSTITIS, UNSPECIFIED 26/71/2458   BILIARY COLIC 09/98/3382   CERUMEN IMPACTION, BILATERAL 01/17/2008   PHARYNGITIS, VIRAL  01/17/2008   NECK PAIN 01/17/2008     Current Outpatient Medications on File Prior to Visit  Medication Sig Dispense Refill   Blood Glucose Monitoring Suppl (ACCU-CHEK GUIDE ME) w/Device KIT Use to check FSBS BID. Dx: E11.42 1 kit 0   dapagliflozin propanediol (FARXIGA) 10 MG TABS tablet Take 1 tablet (10 mg total) by mouth daily before breakfast. 90 tablet 0   glucose blood (TRUE METRIX BLOOD GLUCOSE TEST) test strip Use as instructed 100 each 12   Insulin Pen Needle (TRUEPLUS PEN NEEDLES) 32G X 4 MM MISC Use as instructed to inject Victoza daily. 100 each 2   liraglutide (VICTOZA) 18 MG/3ML SOPN INJECT 1.8 MG UNDER THE SKIN EVERY DAY 9 mL 0   lisinopril (ZESTRIL) 5 MG tablet Take 1 tablet (5 mg total) by mouth daily. 90 tablet 0   metFORMIN (GLUCOPHAGE) 1000 MG tablet Take 1 tablet (1,000 mg total) by mouth 2 (two) times daily with a meal. 180 tablet 0   Accu-Chek Softclix Lancets lancets Use to check FSBS BID. Dx: E11.42 100 each 2   ARIPiprazole (ABILIFY) 10 MG tablet Take 1 tablet (10 mg total) by mouth daily. (Patient not taking: Reported on 04/08/2021) 30 tablet 0   atorvastatin (LIPITOR) 80 MG tablet Take 1 tablet (80 mg total) by mouth daily. (Patient not taking: Reported on 04/08/2021) 90 tablet 0   Blood Glucose Monitoring Suppl (TRUE METRIX METER) w/Device KIT Use as directed 1 kit 0   divalproex (DEPAKOTE) 250 MG DR tablet Take 1 tablet (250 mg total) by mouth 2 (two) times daily. (Patient not taking: Reported on 04/08/2021) 60 tablet 0  LORazepam (ATIVAN) 1 MG tablet Take 1 tablet (1 mg total) by mouth 3 (three) times daily. (Patient not taking: Reported on 04/08/2021) 90 tablet 0   neomycin-polymyxin-hydrocortisone (CORTISPORIN) 3.5-10000-1 OTIC suspension Place 4 drops into the right ear 3 (three) times daily. 10 mL 0   TRUEplus Lancets 28G MISC Use as directed 100 each 1   No current facility-administered medications on file prior to visit.    No Known Allergies  Social  History   Socioeconomic History   Marital status: Single    Spouse name: Not on file   Number of children: 2   Years of education: Not on file   Highest education level: Master's degree (e.g., MA, MS, MEng, MEd, MSW, MBA)  Occupational History   Not on file  Tobacco Use   Smoking status: Former    Types: Cigarettes   Smokeless tobacco: Never  Vaping Use   Vaping Use: Some days   Substances: Nicotine, Flavoring  Substance and Sexual Activity   Alcohol use: Not Currently    Comment: occasional   Drug use: Yes    Types: Marijuana   Sexual activity: Not Currently    Birth control/protection: Surgical  Other Topics Concern   Not on file  Social History Narrative   ** Merged History Encounter **       Social Determinants of Health   Financial Resource Strain: Not on file  Food Insecurity: Not on file  Transportation Needs: Not on file  Physical Activity: Not on file  Stress: Not on file  Social Connections: Not on file  Intimate Partner Violence: Not on file    Family History  Problem Relation Age of Onset   Diabetes Father    Diabetes Paternal Grandmother    Diabetes Maternal Grandmother    Mental illness Cousin    Healthy Mother     Past Surgical History:  Procedure Laterality Date   CERVICAL BIOPSY     CHOLECYSTECTOMY     ESSURE TUBAL LIGATION     HIATAL HERNIA REPAIR     TUBAL LIGATION      ROS: Review of Systems Negative except as stated above  PHYSICAL EXAM: BP 133/74 (BP Location: Left Arm, Patient Position: Sitting, Cuff Size: Large)   Pulse (!) 113   Temp 98.1 F (36.7 C) (Oral)   Ht 6' (1.829 m)   Wt 252 lb 6.4 oz (114.5 kg)   LMP 03/16/2021 (Approximate)   SpO2 96%   BMI 34.23 kg/m   Physical Exam HENT:     Head: Normocephalic and atraumatic.  Eyes:     General: Lids are normal. Gaze aligned appropriately.     Conjunctiva/sclera: Conjunctivae normal.     Comments: No evidence of drainage.   Cardiovascular:     Rate and Rhythm:  Regular rhythm. Tachycardia present.     Heart sounds: Normal heart sounds.  Pulmonary:     Effort: Pulmonary effort is normal.     Breath sounds: Normal breath sounds.  Musculoskeletal:     Cervical back: Normal range of motion and neck supple.  Neurological:     General: No focal deficit present.     Mental Status: She is alert and oriented to person, place, and time.  Psychiatric:        Mood and Affect: Mood normal.        Behavior: Behavior normal.   Results for orders placed or performed in visit on 04/08/21  POCT URINALYSIS DIP (CLINITEK)  Result Value Ref Range  Color, UA yellow yellow   Clarity, UA clear clear   Glucose, UA >=1,000 (A) negative mg/dL   Bilirubin, UA negative negative   Ketones, POC UA trace (5) (A) negative mg/dL   Spec Grav, UA 1.020 1.010 - 1.025   Blood, UA negative negative   pH, UA 5.5 5.0 - 8.0   POC PROTEIN,UA negative negative, trace   Urobilinogen, UA 0.2 0.2 or 1.0 E.U./dL   Nitrite, UA Negative Negative   Leukocytes, UA Negative Negative    ASSESSMENT AND PLAN: 1. Type 2 diabetes mellitus with diabetic polyneuropathy, without long-term current use of insulin ([place]): - Continue Metformin,  Dapagliflozin Propanediol, and Liraglutide as prescribed. - Discussed the importance of healthy eating habits, low-carbohydrate diet, low-sugar diet, regular aerobic exercise (at least 150 minutes a week as tolerated) and medication compliance to achieve or maintain control of diabetes. - Follow-up with primary provider in December 2022 or sooner if needed for repeat hemoglobin A1c. - Insulin Pen Needle (TRUEPLUS PEN NEEDLES) 32G X 4 MM MISC; Use as instructed to inject Victoza daily.  Dispense: 30 each; Refill: 2  2. Vaginal itching: - Urine negative for nitrites and leukocytes, no urinary tract infection.  - Cervicovaginal self-swab to screen for chlamydia, gonorrhea, trichomonas, bacterial vaginitis, and candida vaginitis. - Cervicovaginal ancillary  only - POCT URINALYSIS DIP (CLINITEK)  3. Routine eye exam: - Referral to Ophthalmology for further evaluation and management.  - Ambulatory referral to Ophthalmology   Patient was given the opportunity to ask questions.  Patient verbalized understanding of the plan and was able to repeat key elements of the plan. Patient was given clear instructions to go to Emergency Department or return to medical center if symptoms don't improve, worsen, or new problems develop.The patient verbalized understanding.   Orders Placed This Encounter  Procedures   Ambulatory referral to Ophthalmology   POCT URINALYSIS DIP (CLINITEK)    Follow-up with primary provider in 8 weeks or sooner if needed.   Camillia Herter, NP

## 2021-04-08 ENCOUNTER — Other Ambulatory Visit (HOSPITAL_COMMUNITY)
Admission: RE | Admit: 2021-04-08 | Discharge: 2021-04-08 | Disposition: A | Discharge status: Home / Self Care | Payer: Medicaid Other | Source: Ambulatory Visit | Attending: Family | Admitting: Family

## 2021-04-08 ENCOUNTER — Ambulatory Visit (INDEPENDENT_AMBULATORY_CARE_PROVIDER_SITE_OTHER): Payer: Medicaid Other | Admitting: Family

## 2021-04-08 ENCOUNTER — Encounter: Payer: Self-pay | Admitting: Family

## 2021-04-08 ENCOUNTER — Other Ambulatory Visit: Payer: Self-pay

## 2021-04-08 VITALS — BP 133/74 | HR 113 | Temp 98.1°F | Ht 72.0 in | Wt 252.4 lb

## 2021-04-08 DIAGNOSIS — N898 Other specified noninflammatory disorders of vagina: Secondary | ICD-10-CM | POA: Diagnosis not present

## 2021-04-08 DIAGNOSIS — E1142 Type 2 diabetes mellitus with diabetic polyneuropathy: Secondary | ICD-10-CM | POA: Diagnosis not present

## 2021-04-08 DIAGNOSIS — Z0100 Encounter for examination of eyes and vision without abnormal findings: Secondary | ICD-10-CM

## 2021-04-08 LAB — POCT URINALYSIS DIP (CLINITEK)
Bilirubin, UA: NEGATIVE
Blood, UA: NEGATIVE
Glucose, UA: >=1000 mg/dL — AB
Leukocytes, UA: NEGATIVE
Nitrite, UA: NEGATIVE
POC PROTEIN,UA: NEGATIVE
Spec Grav, UA: 1.02 (ref 1.010–1.025)
Urobilinogen, UA: 0.2 E.U./dL
pH, UA: 5.5 (ref 5.0–8.0)

## 2021-04-08 MED ORDER — TRUEPLUS PEN NEEDLES 32G X 4 MM MISC: 2 refills | Status: DC

## 2021-04-08 NOTE — Progress Notes (Signed)
Pt believes she has an eye infection. Eyes running and crusted  Complains of vaginal itching  Needs rx for insulin pen needles

## 2021-04-11 ENCOUNTER — Other Ambulatory Visit: Payer: Self-pay | Admitting: Family

## 2021-04-11 DIAGNOSIS — B3731 Acute candidiasis of vulva and vagina: Secondary | ICD-10-CM

## 2021-04-11 LAB — CERVICOVAGINAL ANCILLARY ONLY
Bacterial Vaginitis (gardnerella): NEGATIVE
Candida Glabrata: NEGATIVE
Candida Vaginitis: POSITIVE — AB
Chlamydia: NEGATIVE
Comment: NEGATIVE
Comment: NEGATIVE
Comment: NEGATIVE
Comment: NEGATIVE
Comment: NEGATIVE
Comment: NORMAL
Neisseria Gonorrhea: NEGATIVE
Trichomonas: NEGATIVE

## 2021-04-11 MED ORDER — FLUCONAZOLE 150 MG PO TABS: 150.0000 mg | ORAL_TABLET | Freq: Once | ORAL | 0 refills | Status: AC

## 2021-04-11 NOTE — Progress Notes (Signed)
Please call patient with update.   Gonorrhea, Chlamydia, Trichomonas, and Bacterial Vaginitis negative.   Positive for Candida Vaginitis which is better known as a yeast infection. Prescribed Fluconazole (Diflucan)150 mg (1 tablet) for a one time dose to treat this.

## 2021-05-06 ENCOUNTER — Emergency Department (HOSPITAL_COMMUNITY): Payer: 59

## 2021-05-06 ENCOUNTER — Emergency Department (HOSPITAL_COMMUNITY)
Admission: EM | Admit: 2021-05-06 | Discharge: 2021-05-08 | Discharge status: Against Medical Advice | Payer: 59 | Attending: Emergency Medicine | Admitting: Emergency Medicine

## 2021-05-06 ENCOUNTER — Ambulatory Visit
Admission: EM | Admit: 2021-05-06 | Discharge: 2021-05-06 | Disposition: A | Discharge status: Other Acute Inpatient Hospital | Payer: Medicaid Other

## 2021-05-06 ENCOUNTER — Other Ambulatory Visit: Payer: Self-pay

## 2021-05-06 DIAGNOSIS — N390 Urinary tract infection, site not specified: Secondary | ICD-10-CM

## 2021-05-06 DIAGNOSIS — N9489 Other specified conditions associated with female genital organs and menstrual cycle: Secondary | ICD-10-CM | POA: Insufficient documentation

## 2021-05-06 DIAGNOSIS — Z20822 Contact with and (suspected) exposure to covid-19: Secondary | ICD-10-CM | POA: Diagnosis not present

## 2021-05-06 DIAGNOSIS — F250 Schizoaffective disorder, bipolar type: Secondary | ICD-10-CM | POA: Diagnosis not present

## 2021-05-06 DIAGNOSIS — Z87891 Personal history of nicotine dependence: Secondary | ICD-10-CM | POA: Diagnosis not present

## 2021-05-06 DIAGNOSIS — I10 Essential (primary) hypertension: Secondary | ICD-10-CM | POA: Diagnosis not present

## 2021-05-06 DIAGNOSIS — Z794 Long term (current) use of insulin: Secondary | ICD-10-CM | POA: Insufficient documentation

## 2021-05-06 DIAGNOSIS — R479 Unspecified speech disturbances: Secondary | ICD-10-CM | POA: Diagnosis not present

## 2021-05-06 DIAGNOSIS — E119 Type 2 diabetes mellitus without complications: Secondary | ICD-10-CM | POA: Insufficient documentation

## 2021-05-06 DIAGNOSIS — R4689 Other symptoms and signs involving appearance and behavior: Secondary | ICD-10-CM

## 2021-05-06 DIAGNOSIS — Z7984 Long term (current) use of oral hypoglycemic drugs: Secondary | ICD-10-CM | POA: Insufficient documentation

## 2021-05-06 DIAGNOSIS — Z79899 Other long term (current) drug therapy: Secondary | ICD-10-CM | POA: Insufficient documentation

## 2021-05-06 DIAGNOSIS — R4182 Altered mental status, unspecified: Secondary | ICD-10-CM | POA: Insufficient documentation

## 2021-05-06 DIAGNOSIS — R410 Disorientation, unspecified: Secondary | ICD-10-CM | POA: Diagnosis not present

## 2021-05-06 DIAGNOSIS — R531 Weakness: Secondary | ICD-10-CM | POA: Diagnosis not present

## 2021-05-06 DIAGNOSIS — I639 Cerebral infarction, unspecified: Secondary | ICD-10-CM | POA: Diagnosis not present

## 2021-05-06 LAB — I-STAT VENOUS BLOOD GAS, ED
Acid-base deficit: 4 mmol/L — ABNORMAL HIGH (ref 0.0–2.0)
Bicarbonate: 17 mmol/L — ABNORMAL LOW (ref 20.0–28.0)
Calcium, Ion: 1.03 mmol/L — ABNORMAL LOW (ref 1.15–1.40)
HCT: 41 % (ref 36.0–46.0)
Hemoglobin: 13.9 g/dL (ref 12.0–15.0)
O2 Saturation: 96 %
Potassium: 4.5 mmol/L (ref 3.5–5.1)
Sodium: 132 mmol/L — ABNORMAL LOW (ref 135–145)
TCO2: 18 mmol/L — ABNORMAL LOW (ref 22–32)
pCO2, Ven: 21.7 mmHg — ABNORMAL LOW (ref 44.0–60.0)
pH, Ven: 7.503 — ABNORMAL HIGH (ref 7.250–7.430)
pO2, Ven: 74 mmHg — ABNORMAL HIGH (ref 32.0–45.0)

## 2021-05-06 LAB — I-STAT CHEM 8, ED
BUN: 22 mg/dL — ABNORMAL HIGH (ref 6–20)
Calcium, Ion: 1.06 mmol/L — ABNORMAL LOW (ref 1.15–1.40)
Chloride: 105 mmol/L (ref 98–111)
Creatinine, Ser: 0.8 mg/dL (ref 0.44–1.00)
Glucose, Bld: 334 mg/dL — ABNORMAL HIGH (ref 70–99)
HCT: 42 % (ref 36.0–46.0)
Hemoglobin: 14.3 g/dL (ref 12.0–15.0)
Potassium: 4.5 mmol/L (ref 3.5–5.1)
Sodium: 133 mmol/L — ABNORMAL LOW (ref 135–145)
TCO2: 18 mmol/L — ABNORMAL LOW (ref 22–32)

## 2021-05-06 LAB — COMPREHENSIVE METABOLIC PANEL
ALT: 19 U/L (ref 0–44)
AST: 27 U/L (ref 15–41)
Albumin: 4 g/dL (ref 3.5–5.0)
Alkaline Phosphatase: 85 U/L (ref 38–126)
Anion gap: 15 (ref 5–15)
BUN: 19 mg/dL (ref 6–20)
CO2: 16 mmol/L — ABNORMAL LOW (ref 22–32)
Calcium: 9.5 mg/dL (ref 8.9–10.3)
Chloride: 100 mmol/L (ref 98–111)
Creatinine, Ser: 0.91 mg/dL (ref 0.44–1.00)
GFR, Estimated: >60 mL/min (ref >60)
Glucose, Bld: 338 mg/dL — ABNORMAL HIGH (ref 70–99)
Potassium: 4.5 mmol/L (ref 3.5–5.1)
Sodium: 131 mmol/L — ABNORMAL LOW (ref 135–145)
Total Bilirubin: 1.3 mg/dL — ABNORMAL HIGH (ref 0.3–1.2)
Total Protein: 7.8 g/dL (ref 6.5–8.1)

## 2021-05-06 LAB — DIFFERENTIAL
Abs Immature Granulocytes: 0.06 10*3/uL (ref 0.00–0.07)
Basophils Absolute: 0.1 10*3/uL (ref 0.0–0.1)
Basophils Relative: 0 %
Eosinophils Absolute: 0 10*3/uL (ref 0.0–0.5)
Eosinophils Relative: 0 %
Immature Granulocytes: 0 %
Lymphocytes Relative: 9 %
Lymphs Abs: 1.3 10*3/uL (ref 0.7–4.0)
Monocytes Absolute: 0.8 10*3/uL (ref 0.1–1.0)
Monocytes Relative: 6 %
Neutro Abs: 11.8 10*3/uL — ABNORMAL HIGH (ref 1.7–7.7)
Neutrophils Relative %: 85 %

## 2021-05-06 LAB — URINALYSIS, ROUTINE W REFLEX MICROSCOPIC
Bilirubin Urine: NEGATIVE
Glucose, UA: >=500 mg/dL — AB
Hgb urine dipstick: NEGATIVE
Ketones, ur: 20 mg/dL — AB
Nitrite: NEGATIVE
Protein, ur: 30 mg/dL — AB
Specific Gravity, Urine: >1.046 — ABNORMAL HIGH (ref 1.005–1.030)
pH: 5 (ref 5.0–8.0)

## 2021-05-06 LAB — PROTIME-INR
INR: 1.1 (ref 0.8–1.2)
Prothrombin Time: 14.1 seconds (ref 11.4–15.2)

## 2021-05-06 LAB — RAPID URINE DRUG SCREEN, HOSP PERFORMED
Amphetamines: NOT DETECTED
Barbiturates: NOT DETECTED
Benzodiazepines: NOT DETECTED
Cocaine: NOT DETECTED
Opiates: NOT DETECTED
Tetrahydrocannabinol: POSITIVE — AB

## 2021-05-06 LAB — CBC
HCT: 39.2 % (ref 36.0–46.0)
Hemoglobin: 12.4 g/dL (ref 12.0–15.0)
MCH: 23.7 pg — ABNORMAL LOW (ref 26.0–34.0)
MCHC: 31.6 g/dL (ref 30.0–36.0)
MCV: 74.8 fL — ABNORMAL LOW (ref 80.0–100.0)
Platelets: 428 10*3/uL — ABNORMAL HIGH (ref 150–400)
RBC: 5.24 MIL/uL — ABNORMAL HIGH (ref 3.87–5.11)
RDW: 17 % — ABNORMAL HIGH (ref 11.5–15.5)
WBC: 14 10*3/uL — ABNORMAL HIGH (ref 4.0–10.5)
nRBC: 0 % (ref 0.0–0.2)

## 2021-05-06 LAB — RESP PANEL BY RT-PCR (FLU A&B, COVID) ARPGX2
Influenza A by PCR: NEGATIVE
Influenza B by PCR: NEGATIVE
SARS Coronavirus 2 by RT PCR: NEGATIVE

## 2021-05-06 LAB — AMMONIA: Ammonia: <10 umol/L (ref 9–35)

## 2021-05-06 LAB — CBG MONITORING, ED
Glucose-Capillary: 318 mg/dL — ABNORMAL HIGH (ref 70–99)
Glucose-Capillary: 214 mg/dL — ABNORMAL HIGH (ref 70–99)

## 2021-05-06 LAB — ETHANOL: Alcohol, Ethyl (B): <10 mg/dL (ref <10)

## 2021-05-06 LAB — I-STAT BETA HCG BLOOD, ED (MC, WL, AP ONLY): I-stat hCG, quantitative: <5 m[IU]/mL (ref <5)

## 2021-05-06 MED ORDER — ONDANSETRON HCL 4 MG PO TABS: 4.0000 mg | ORAL_TABLET | Freq: Three times a day (TID) | ORAL | Status: DC | PRN

## 2021-05-06 MED ORDER — ZOLPIDEM TARTRATE 5 MG PO TABS
5.0000 mg | ORAL_TABLET | Freq: Every evening | ORAL | Status: DC | PRN
  Administered 2021-05-07: 5 mg via ORAL
  Filled 2021-05-06: qty 1

## 2021-05-06 MED ORDER — IOHEXOL 350 MG/ML SOLN
100.0000 mL | Freq: Once | INTRAVENOUS | Status: AC | PRN
  Administered 2021-05-06: 100 mL via INTRAVENOUS

## 2021-05-06 MED ORDER — NITROFURANTOIN MONOHYD MACRO 100 MG PO CAPS: 100.0000 mg | ORAL_CAPSULE | Freq: Two times a day (BID) | ORAL | 0 refills | Status: AC

## 2021-05-06 MED ORDER — LACTATED RINGERS IV BOLUS
1000.0000 mL | Freq: Once | INTRAVENOUS | Status: AC
  Administered 2021-05-06: 1000 mL via INTRAVENOUS

## 2021-05-06 MED ORDER — ACETAMINOPHEN 325 MG PO TABS
650.0000 mg | ORAL_TABLET | ORAL | Status: DC | PRN
  Administered 2021-05-07: 650 mg via ORAL
  Filled 2021-05-06: qty 2

## 2021-05-06 MED ORDER — NITROFURANTOIN MONOHYD MACRO 100 MG PO CAPS
100.0000 mg | ORAL_CAPSULE | Freq: Once | ORAL | Status: AC
  Administered 2021-05-06: 100 mg via ORAL
  Filled 2021-05-06: qty 1

## 2021-05-06 NOTE — ED Notes (Signed)
Pt here w/ stroke-like sx, ushered immediately to room from lobby, provider assessing at bedside.

## 2021-05-06 NOTE — ED Notes (Addendum)
Pt found trying to sit up in the bed after spitting out food that had been stuck in her mouth. Pt bringing left hand up to face with eyes open but once staff walks into room, pt closes eyes and lays back down.

## 2021-05-06 NOTE — ED Notes (Signed)
Pt outside building with ems. Stable on dc.

## 2021-05-06 NOTE — ED Notes (Signed)
Ems emergency contacted.

## 2021-05-06 NOTE — ED Provider Notes (Signed)
[facility] EMERGENCY DEPARTMENT Provider Note   CSN: [id number] Arrival date & time: 05/06/21  3646     History No chief complaint on file.   [name] is a 45 y.o. female.  45 year old female with a history significant for schizoaffective disorder who presented wi was brought in by EMS from urgent care with complaints of strokelike symptoms that started prior afternoon.  Patient is nonverbal and unable to follow commands but she does open eyes to voice.  Patient's friend had taken her to urgent care when they called the patient earlier in the morning and noticed that she was nontoxic. Discussed with patient's sister who reported that patient has a history of multiple similar complaints in the past.  She states that the patient has developed weakness and nonresponsiveness in the settings of acute life stressors due to a history of psychological trauma.  The sister reports that currently the patient is being removed from her apartment, and suggest that this is a possible trigger.  Sister reports that she does not believe that the patient would have taken any drugs under suicide or attempt to harm herself.  The history is provided by a relative and medical records. No language interpreter was used.      Past Medical History:  Diagnosis Date   Abnormal Pap smear    Bipolar 1 disorder ([place])    Diabetes mellitus without complication (H[facility])    Fibroids    Hypertension    IBS (irritable bowel syndrome)     Patient Active Problem List   Diagnosis Date Noted   Candida vaginitis 04/11/2021   Essential hypertension 04/08/2019   Class 2 severe obesity due to excess calories with serious comorbidity and body mass index (BMI) of 39.0 to 39.9 in adult ([place]) 04/08/2019   Tobacco dependence 04/08/2019   Schizoaffective disorder, bipolar type ([place]) 05/18/2018   Diabetes mellitus ([place]) 08/10/2016   Bipolar disorder, curr episode mixed, severe, with psychotic features ([place])  08/07/2016   Cannabis use disorder, moderate, dependence ([place]) 08/07/2016   CHOLECYSTITIS, UNSPECIFIED 80/32/1224   BILIARY COLIC 82/50/0370   CERUMEN IMPACTION, BILATERAL 01/17/2008   PHARYNGITIS, VIRAL 01/17/2008   NECK PAIN 01/17/2008    Past Surgical History:  Procedure Laterality Date   CERVICAL BIOPSY     CHOLECYSTECTOMY     ESSURE TUBAL LIGATION     HIATAL HERNIA REPAIR     TUBAL LIGATION       OB History     Gravida  7   Para  2   Term  2   Preterm  0   AB  5   Living         SAB  0   IAB  4   Ectopic  1   Multiple      Live Births              Family History  Problem Relation Age of Onset   Diabetes Father    Diabetes Paternal Grandmother    Diabetes Maternal Grandmother    Mental illness Cousin    Healthy Mother     Social History   Tobacco Use   Smoking status: Former    Types: Cigarettes   Smokeless tobacco: Never  Vaping Use   Vaping Use: Some days   Substances: Nicotine, Flavoring  Substance Use Topics   Alcohol use: Not Currently    Comment: occasional   Drug use: Yes    Types: Marijuana    Home Medications  Prior to Admission medications   Medication Sig Start Date End Date Taking? Authorizing Provider  Accu-Chek Softclix Lancets lancets Use to check FSBS BID. Dx: E11.42 03/11/20   [name] Bang, MD  ARIPiprazole (ABILIFY) 10 MG tablet Take 1 tablet (10 mg total) by mouth daily. Patient not taking: Reported on 04/08/2021 11/09/20   Camillia Herter, NP  atorvastatin (LIPITOR) 80 MG tablet Take 1 tablet (80 mg total) by mouth daily. Patient not taking: Reported on 04/08/2021 11/09/20   Camillia Herter, NP  Blood Glucose Monitoring Suppl (ACCU-CHEK GUIDE ME) w/Device KIT Use to check FSBS BID. Dx: E11.42 03/11/20   [name] Bang, MD  Blood Glucose Monitoring Suppl (TRUE METRIX METER) w/Device KIT Use as directed 03/09/21   Camillia Herter, NP  dapagliflozin propanediol (FARXIGA) 10 MG TABS tablet Take 1  tablet (10 mg total) by mouth daily before breakfast. 03/09/21 06/07/21  Camillia Herter, NP  divalproex (DEPAKOTE) 250 MG DR tablet Take 1 tablet (250 mg total) by mouth 2 (two) times daily. Patient not taking: Reported on 04/08/2021 11/09/20   Camillia Herter, NP  glucose blood (TRUE METRIX BLOOD GLUCOSE TEST) test strip Use as instructed 03/09/21   Camillia Herter, NP  Insulin Pen Needle (TRUEPLUS PEN NEEDLES) 32G X 4 MM MISC Use as instructed to inject Victoza daily. 04/08/21   Camillia Herter, NP  liraglutide (VICTOZA) 18 MG/3ML SOPN INJECT 1.8 MG UNDER THE SKIN EVERY DAY 03/09/21   Camillia Herter, NP  lisinopril (ZESTRIL) 5 MG tablet Take 1 tablet (5 mg total) by mouth daily. 03/09/21 06/07/21  Camillia Herter, NP  LORazepam (ATIVAN) 1 MG tablet Take 1 tablet (1 mg total) by mouth 3 (three) times daily. Patient not taking: Reported on 04/08/2021 11/09/20   Camillia Herter, NP  metFORMIN (GLUCOPHAGE) 1000 MG tablet Take 1 tablet (1,000 mg total) by mouth 2 (two) times daily with a meal. 03/09/21 06/07/21  Camillia Herter, NP  neomycin-polymyxin-hydrocortisone (CORTISPORIN) 3.5-10000-1 OTIC suspension Place 4 drops into the right ear 3 (three) times daily. 09/11/20   Vanessa Kick, MD  TRUEplus Lancets 28G MISC Use as directed 03/09/21   Camillia Herter, NP    Allergies    Patient has no known allergies.  Review of Systems   Review of Systems  Constitutional: Negative.   HENT: Negative.    Eyes: Negative.   Respiratory: Negative.    Cardiovascular: Negative.   Gastrointestinal: Negative.   Endocrine: Negative.   Genitourinary: Negative.   Musculoskeletal: Negative.   Neurological: Negative.   Hematological: Negative.   Psychiatric/Behavioral: Negative.     Physical Exam Updated Vital Signs BP 138/89 (BP Location: Right Arm)   Pulse (!) 114   Temp 99 F (37.2 C) (Oral)   Resp (!) 24   SpO2 96%   Physical Exam Constitutional:      Appearance: Normal appearance. She is obese. She is not  ill-appearing or diaphoretic.  HENT:     Head: Atraumatic.     Mouth/Throat:     Mouth: Mucous membranes are moist.     Comments: pocketed food products Eyes:     Conjunctiva/sclera: Conjunctivae normal.     Pupils: Pupils are equal, round, and reactive to light.  Cardiovascular:     Rate and Rhythm: Regular rhythm. Tachycardia present.     Heart sounds: No murmur heard.   No friction rub. No gallop.  Pulmonary:     Effort: Pulmonary effort is normal.  Breath sounds: Normal breath sounds.     Comments: tachypneic Abdominal:     General: Abdomen is flat. Bowel sounds are normal.     Palpations: Abdomen is soft.  Musculoskeletal:        General: Normal range of motion.     Cervical back: Normal range of motion and neck supple. No rigidity.  Skin:    General: Skin is warm and dry.     Coloration: Skin is not jaundiced or pale.     Findings: No bruising, erythema, lesion or rash.  Neurological:     Mental Status: She is disoriented.     Comments: Pt initially appears generalized weakness, however, she does inconsistently withdraw from noxious stimuli. Responds to sternal rub inconsistently. Sensation appears to be grossly intact. She inconsistently supports left arm  against gravity. When dropping arm towards face, she typically deflects, and occasionally slows the fall on her own. Opens eyes to verbal command inconsistently. She is properly protecting airway.   Psychiatric:     Comments: Unable to assess    ED Results / Procedures / Treatments   Labs (all labs ordered are listed, but only abnormal results are displayed) Labs Reviewed  CBC - Abnormal; Notable for the following components:      Result Value   WBC 14.0 (*)    RBC 5.24 (*)    MCV 74.8 (*)    MCH 23.7 (*)    RDW 17.0 (*)    Platelets 428 (*)    All other components within normal limits  DIFFERENTIAL - Abnormal; Notable for the following components:   Neutro Abs 11.8 (*)    All other components within  normal limits  CBG MONITORING, ED - Abnormal; Notable for the following components:   Glucose-Capillary 318 (*)    All other components within normal limits  I-STAT CHEM 8, ED - Abnormal; Notable for the following components:   Sodium 133 (*)    BUN 22 (*)    Glucose, Bld 334 (*)    Calcium, Ion 1.06 (*)    TCO2 18 (*)    All other components within normal limits  I-STAT VENOUS BLOOD GAS, ED - Abnormal; Notable for the following components:   pH, Ven 7.503 (*)    pCO2, Ven 21.7 (*)    pO2, Ven 74.0 (*)    Bicarbonate 17.0 (*)    TCO2 18 (*)    Acid-base deficit 4.0 (*)    Sodium 132 (*)    Calcium, Ion 1.03 (*)    All other components within normal limits  RESP PANEL BY RT-PCR (FLU A&B, COVID) ARPGX2  ETHANOL  PROTIME-INR  COMPREHENSIVE METABOLIC PANEL  RAPID URINE DRUG SCREEN, HOSP PERFORMED  URINALYSIS, ROUTINE W REFLEX MICROSCOPIC  BLOOD GAS, VENOUS  AMMONIA  I-STAT BETA HCG BLOOD, ED ([facility], [facility], [facility] ONLY)    EKG None  Radiology No results found.  Procedures Procedures   Medications Ordered in ED Medications  iohexol (OMNIPAQUE) 350 MG/ML injection 100 mL (100 mLs Intravenous Contrast Given 05/06/21 1004)    ED Course  I have reviewed the triage vital signs and the nursing notes.  Pertinent labs & imaging results that were available during my care of the patient were reviewed by me and considered in my medical decision making (see chart for details).    MDM Rules/Calculators/A&P  Patient presented with altered mental status and generlized weakness. Initially taken to urgent care by friends who noticed that she was not speaking much starting last night around dinner time. Friends called her today and were concerned enough to take her to UC for weakness, altered mental status. She was nonverbal on presentation.  During physical exam, she was nonverbal, not following commands.  displayed generalized weakness.   Code stroke was called and  taken to CT and imaging did not reveal any acute abnormalities.  Able to reach sister by telephone who reports a significant psychiatric history for patient. She states that she has presented to the hospital several times in the past with similar complaints. States these episodes occur when patient is dealing with life stressors. Called sister to see if she would be able to come to hospital to see patient at bedside, however, she does not live in the area. Sister recommended contacting pt's significant other, however was unable to reach him by telephone. Other sister who does live in the area has transportation problems. AMS in conjunction with labwork showing low CO2 and and elevated glucose of 300 raised initial concern for DKA, however, VBG was alkylotic indicating findings more consistent with respiratory driven etiology, likely due to hyperventilation. She was given 1L LR to help with tachycardia and hyperglycemia.  Patient is medically cleared for psych evaluation. Will place orders for psych hold.  Final Clinical Impression(s) / ED Diagnoses Final diagnoses:  None    Rx / DC Orders ED Discharge Orders     None        Delene Ruffini, MD 05/07/21 [id number]    Lucrezia Starch, MD 05/07/21 [phone]

## 2021-05-06 NOTE — BH Assessment (Signed)
Comprehensive Clinical Assessment (CCA) Note  05/06/2021 [name] [id number]  DISPOSITION: Gave clinical report to Quintella Reichert, NP who determined Pt meets criteria for inpatient psychiatric treatment. Contacted Lavell Luster, [facility], who will review for possible admission. Notified Dr. Evangeline Dakin and Tilden Dome, RN of recommendation via secure message.  The patient demonstrates the following risk factors for suicide: Chronic risk factors for suicide include: psychiatric disorder of schizoaffective disorder, bipolar type . Acute risk factors for suicide include: N/A. Protective factors for this patient include: positive social support, positive therapeutic relationship, and responsibility to others (children, family). Considering these factors, the overall suicide risk at this point appears to be low. Patient is not appropriate for outpatient follow up.  [place] ED from 05/06/2021 in [facility]  ED from 02/21/2021 in [facility] ED from 02/18/2021 in [place] DEPT  C-SSRS RISK CATEGORY No Risk No Risk No Risk      Pt is a 45 year old female who presents to [facility] ED accompanied by her boyfriend, Gordy Levan [phone], who participated in assessment. Pt is lying on the bed with her eyes closed and would not respond to any questions. Pt's medical record indicates she has a history of schizoaffective disorder, bipolar type and a history of manic episodes. Mr Ellin Mayhew states Pt has been stressed recently because she could no longer afford her residence and is moving to [place] to live with relatives. He says he saw her yesterday around noon and she appeared stressed. He says he called her in the evening and she was responding with single-word answers and he knew something was wrong. He says today she has not been responding to anyone. Pt was brought to [facility] with concern she had  experienced a stroke. Medical record indicates Pt has been cleared by neurology.   Mr Ellin Mayhew says he has seen Pt in manic episodes before but he has never seen her with current symptoms. He says she has not given any indication that she is suicidal or having thoughts of harming herself. He says she has not been physically aggressive. He says she is often awake at night. He says to his knowledge she occasionally uses marijuana but does not use any other substances. He says he knows she sees a psychiatrist and a therapist but does not know their names. He says she has been psychiatrically hospitalized in the past and Pt's medical record indicates she was hospitalized at [facility] in February 2022 and [facility] in November 2019.  Pt was lying in bed throughout assessment with eyes closed. She occasionally groaned and and appeared mildly restless. She did not respond to any questions from TTS or Mr Ellin Mayhew.  Chief Complaint: No chief complaint on file.  Visit Diagnosis: F25.0 Schizoaffective disorder, Bipolar type   CCA Screening, Triage and Referral (STR)  Patient Reported Information How did you hear about [place]? Family/Friend  What Is the Reason for Your Visit/Call Today? Pt has diagnosis of bipolar disorder and a history of manic episodes. Today she stopped speaking and is not responding to people around her.  How Long Has This Been Causing You Problems? <Week  What Do You Feel Would Help You the Most Today? Treatment for Depression or other mood problem; Medication(s)   Have You Recently Had Any Thoughts About Hurting Yourself? No  Are You Planning to Commit Suicide/Harm Yourself At This time? No   Have you Recently Had  Thoughts About Hurting Someone [place]? No  Are You Planning to Harm Someone at This Time? No  Explanation: No data recorded  Have You Used Any Alcohol or Drugs in the Past 24 Hours? -- (Unable to assess due to Pt not responding to questions. UDS+ for  cannabis.)  How Long Ago Did You Use Drugs or Alcohol? No data recorded What Did You Use and How Much? No data recorded  Do You Currently Have a Therapist/Psychiatrist? Yes  Name of Therapist/Psychiatrist: Unknown   Have You Been Recently Discharged From Any Office Practice or Programs? No  Explanation of Discharge From Practice/Program: No data recorded    CCA Screening Triage Referral Assessment Type of Contact: Tele-Assessment  Telemedicine Service Delivery: Telemedicine service delivery: This service was provided via telemedicine using a 2-way, interactive audio and video technology  Is this Initial or Reassessment? Initial Assessment  Date Telepsych consult ordered in [facility]:  05/06/21  Time Telepsych consult ordered in [facility]:  1153  Location of Assessment: [facility] ED  Provider Location: [facility] Assessment Services   Collateral Involvement: Boyfriend: Reggie Woodard [phone]   Does Patient Have a Stage manager Guardian? No data recorded Name and Contact of Legal Guardian: No data recorded If Minor and Not Living with Parent(s), Who has Custody? NA  Is CPS involved or ever been involved? -- (Unable to assess due to Pt not responding to questions.)  Is APS involved or ever been involved? -- (Unable to assess due to Pt not responding to questions.)   Patient Determined To Be At Risk for Harm To Self or Others Based on Review of Patient Reported Information or Presenting Complaint? No data recorded Method: No data recorded Availability of Means: No data recorded Intent: No data recorded Notification Required: No data recorded Additional Information for Danger to Others Potential: No data recorded Additional Comments for Danger to Others Potential: No data recorded Are There Guns or Other Weapons in Your Home? No data recorded Types of Guns/Weapons: No data recorded Are These Weapons Safely Secured?                            No data recorded Who Could Verify  You Are Able To Have These Secured: No data recorded Do You Have any Outstanding Charges, Pending Court Dates, Parole/Probation? No data recorded Contacted To Inform of Risk of Harm To Self or Others: Other: Comment (NA)    Does Patient Present under Involuntary Commitment? No  IVC Papers Initial File Date: No data recorded  [place] of Residence: [place]   Patient Currently Receiving the Following Services: Individual Therapy; Medication Management   Determination of Need: Emergent (2 hours)   Options For Referral: Inpatient Hospitalization     CCA Biopsychosocial Patient Reported Schizophrenia/Schizoaffective Diagnosis in Past: Yes   Strengths: Unable to assess due to Pt not responding to questions.   Mental Health Symptoms Depression:   Change in energy/activity   Duration of Depressive symptoms:  Duration of Depressive Symptoms: Less than two weeks   Mania:   -- (Unable to assess due to Pt not responding to questions.)   Anxiety:    -- (Unable to assess due to Pt not responding to questions.)   Psychosis:   Affective flattening/alogia/avolition   Duration of Psychotic symptoms:  Duration of Psychotic Symptoms: Less than six months   Trauma:   -- (Unable to assess due to Pt not responding to questions.)   Obsessions:   -- (  Unable to assess due to Pt not responding to questions.)   Compulsions:   -- (Unable to assess due to Pt not responding to questions.)   Inattention:   -- (Unable to assess due to Pt not responding to questions.)   Hyperactivity/Impulsivity:   -- (Unable to assess due to Pt not responding to questions.)   Oppositional/Defiant Behaviors:   -- (Unable to assess due to Pt not responding to questions.)   Emotional Irregularity:   -- (Unable to assess due to Pt not responding to questions.)   Other Mood/Personality Symptoms:   NA    Mental Status Exam Appearance and self-care  Stature:   Tall   Weight:   Overweight    Clothing:   -- (Scrubs)   Grooming:   Normal   Cosmetic use:   None   Posture/gait:   Slumped   Motor activity:   Restless   Sensorium  Attention:   Inattentive   Concentration:   -- (Unable to assess due to Pt not responding to questions.)   Orientation:   -- (Unable to assess due to Pt not responding to questions.)   Recall/memory:   -- (Unable to assess due to Pt not responding to questions.)   Affect and Mood  Affect:   -- (Unable to assess due to Pt not responding to questions.)   Mood:   -- (Unable to assess due to Pt not responding to questions.)   Relating  Eye contact:   None   Facial expression:   Anxious   Attitude toward examiner:   Uninterested   Thought and Language  Speech flow:  Mute   Thought content:   -- (Unable to assess due to Pt not responding to questions.)   Preoccupation:   -- (Unable to assess due to Pt not responding to questions.)   Hallucinations:   -- (Unable to assess due to Pt not responding to questions.)   Organization:  No data recorded  [employer] of Knowledge:   Average   Intelligence:   Average   Abstraction:   -- (Unable to assess due to Pt not responding to questions.)   Judgement:   -- (Unable to assess due to Pt not responding to questions.)   Reality Testing:   -- (Unable to assess due to Pt not responding to questions.)   Insight:   -- (Unable to assess due to Pt not responding to questions.)   Decision Making:   Paralyzed   Social Functioning  Social Maturity:   -- (Unable to assess due to Pt not responding to questions.)   Social Judgement:   -- (Unable to assess due to Pt not responding to questions.)   Stress  Stressors:   Housing; Museum/gallery curator; Transitions   Coping Ability:   Exhausted   Skill Deficits:   -- (Unable to assess due to Pt not responding to questions.)   Supports:   Family; Friends/Service system     Religion: Religion/Spirituality Are  You A Religious Person?:  (Unable to assess due to Pt not responding to questions.)  Leisure/Recreation: Leisure / Recreation Do You Have Hobbies?: Yes Leisure and Hobbies: Child psychotherapist, dance, free volunteer work."  Exercise/Diet: Exercise/Diet Do You Exercise?:  (Unable to assess due to Pt not responding to questions.) Have You Gained or Lost A Significant Amount of Weight in the Past Six Months?:  (Unable to assess due to Pt not responding to questions.) Do You Follow a Special Diet?:  (Unable to assess due to Pt  not responding to questions.) Do You Have Any Trouble Sleeping?:  (Unable to assess due to Pt not responding to questions.)   CCA Employment/Education Employment/Work Situation: Employment / Work Situation Employment Situation:  (Unable to assess due to Pt not responding to questions.) Patient's Job has Been Impacted by Current Illness:  (Unable to assess due to Pt not responding to questions.) Has Patient ever Been in the [employer]?: No  Education: Education Is Patient Currently Attending School?:  (Unable to assess due to Pt not responding to questions.) Did You Attend College?: Yes What Type of College Degree Do you Have?: Pt has 2 masters degrees Did You Have An Individualized Education Program (IIEP):  (Unable to assess due to Pt not responding to questions.) Did You Have Any Difficulty At School?:  (Unable to assess due to Pt not responding to questions.) Patient's Education Has Been Impacted by Current Illness:  (Unable to assess due to Pt not responding to questions.)   CCA Family/Childhood History Family and Relationship History: Family history Does patient have children?: Yes How many children?: 2 How is patient's relationship with their children?: Unable to assess due to Pt not responding to questions.  Childhood History:  Childhood History By whom was/is the patient raised?: Both parents Did patient suffer any verbal/emotional/physical/sexual abuse as a  child?:  (Unable to assess due to Pt not responding to questions.) Did patient suffer from severe childhood neglect?:  (Unable to assess due to Pt not responding to questions.) Has patient ever been sexually abused/assaulted/raped as an adolescent or adult?:  (Unable to assess due to Pt not responding to questions.) Was the patient ever a victim of a crime or a disaster?:  (Unable to assess due to Pt not responding to questions.) Witnessed domestic violence?:  (Unable to assess due to Pt not responding to questions.) Has patient been affected by domestic violence as an adult?:  (Unable to assess due to Pt not responding to questions.)  Child/Adolescent Assessment:     CCA Substance Use Alcohol/Drug Use: Alcohol / Drug Use Pain Medications: See MAR Prescriptions: See MAR Over the Counter: See MAR History of alcohol / drug use?: Yes (Urine drug screen positive for cannabis.) Longest period of sobriety (when/how long): Unknown                         ASAM's:  Six Dimensions of Multidimensional Assessment  Dimension 1:  Acute Intoxication and/or Withdrawal Potential:      Dimension 2:  Biomedical Conditions and Complications:      Dimension 3:  Emotional, Behavioral, or Cognitive Conditions and Complications:     Dimension 4:  Readiness to Change:     Dimension 5:  Relapse, Continued use, or Continued Problem Potential:     Dimension 6:  Recovery/Living Environment:     ASAM Severity Score:    ASAM Recommended Level of Treatment:     Substance use Disorder (SUD)    Recommendations for Services/Supports/Treatments:    Discharge Disposition: Discharge Disposition Medical Exam completed: Yes  DSM5 Diagnoses: Patient Active Problem List   Diagnosis Date Noted   Candida vaginitis 04/11/2021   Essential hypertension 04/08/2019   Class 2 severe obesity due to excess calories with serious comorbidity and body mass index (BMI) of 39.0 to 39.9 in adult ([place]) 04/08/2019    Tobacco dependence 04/08/2019   Schizoaffective disorder, bipolar type ([place]) 05/18/2018   Diabetes mellitus ([place]) 08/10/2016   Bipolar disorder, curr episode mixed, severe, with psychotic  features ([place]) 08/07/2016   Cannabis use disorder, moderate, dependence ([place]) 08/07/2016   CHOLECYSTITIS, UNSPECIFIED 30/04/4044   BILIARY COLIC 91/36/8599   CERUMEN IMPACTION, BILATERAL 01/17/2008   PHARYNGITIS, VIRAL 01/17/2008   NECK PAIN 01/17/2008     Referrals to Alternative Service(s): Referred to Alternative Service(s):   Place:   Date:   Time:    Referred to Alternative Service(s):   Place:   Date:   Time:    Referred to Alternative Service(s):   Place:   Date:   Time:    Referred to Alternative Service(s):   Place:   Date:   Time:     Evelena Peat, [facility]

## 2021-05-06 NOTE — ED Notes (Signed)
Dinner ordered 

## 2021-05-06 NOTE — ED Provider Notes (Signed)
[facility]    CSN: [id number] Arrival date & time: 05/06/21  0837      History   Chief Complaint Chief Complaint  Patient presents with   non-verbal    HPI [name] is a 45 y.o. female.   Patient brought in today by friend who reports that this morning she has been nonverbal and noncommunicative.  This is not something he has seen from her in the past.  He reports last known normal was yesterday afternoon.  She is able to walk without assistance.  She has past medical history of bipolar 1 disorder and he states she has had to be seen in behavioral health urgent care for manic episode in the past but nothing like this.  The history is provided by a friend.   Past Medical History:  Diagnosis Date   Abnormal Pap smear    Bipolar 1 disorder ([place])    Diabetes mellitus without complication ([place])    Fibroids    Hypertension    IBS (irritable bowel syndrome)     Patient Active Problem List   Diagnosis Date Noted   Candida vaginitis 04/11/2021   Essential hypertension 04/08/2019   Class 2 severe obesity due to excess calories with serious comorbidity and body mass index (BMI) of 39.0 to 39.9 in adult ([place]) 04/08/2019   Tobacco dependence 04/08/2019   Schizoaffective disorder, bipolar type ([place]) 05/18/2018   Diabetes mellitus ([place]) 08/10/2016   Bipolar disorder, curr episode mixed, severe, with psychotic features ([place]) 08/07/2016   Cannabis use disorder, moderate, dependence ([place]) 08/07/2016   CHOLECYSTITIS, UNSPECIFIED 50/93/2671   BILIARY COLIC 24/58/0998   CERUMEN IMPACTION, BILATERAL 01/17/2008   PHARYNGITIS, VIRAL 01/17/2008   NECK PAIN 01/17/2008    Past Surgical History:  Procedure Laterality Date   CERVICAL BIOPSY     CHOLECYSTECTOMY     ESSURE TUBAL LIGATION     HIATAL HERNIA REPAIR     TUBAL LIGATION      OB History     Gravida  7   Para  2   Term  2   Preterm  0   AB  5   Living         SAB  0   IAB  4   Ectopic  1    Multiple      Live Births               Home Medications    Prior to Admission medications   Medication Sig Start Date End Date Taking? Authorizing Provider  Accu-Chek Softclix Lancets lancets Use to check FSBS BID. Dx: E11.42 03/11/20   [name] Bang, MD  ARIPiprazole (ABILIFY) 10 MG tablet Take 1 tablet (10 mg total) by mouth daily. Patient not taking: Reported on 04/08/2021 11/09/20   Camillia Herter, NP  atorvastatin (LIPITOR) 80 MG tablet Take 1 tablet (80 mg total) by mouth daily. Patient not taking: Reported on 04/08/2021 11/09/20   Camillia Herter, NP  Blood Glucose Monitoring Suppl (ACCU-CHEK GUIDE ME) w/Device KIT Use to check FSBS BID. Dx: E11.42 03/11/20   [name] Bang, MD  Blood Glucose Monitoring Suppl (TRUE METRIX METER) w/Device KIT Use as directed 03/09/21   Camillia Herter, NP  dapagliflozin propanediol (FARXIGA) 10 MG TABS tablet Take 1 tablet (10 mg total) by mouth daily before breakfast. 03/09/21 06/07/21  Camillia Herter, NP  divalproex (DEPAKOTE) 250 MG DR tablet Take 1 tablet (250 mg total) by mouth 2 (two) times  daily. Patient not taking: Reported on 04/08/2021 11/09/20   Camillia Herter, NP  glucose blood (TRUE METRIX BLOOD GLUCOSE TEST) test strip Use as instructed 03/09/21   Camillia Herter, NP  Insulin Pen Needle (TRUEPLUS PEN NEEDLES) 32G X 4 MM MISC Use as instructed to inject Victoza daily. 04/08/21   Camillia Herter, NP  liraglutide (VICTOZA) 18 MG/3ML SOPN INJECT 1.8 MG UNDER THE SKIN EVERY DAY 03/09/21   Camillia Herter, NP  lisinopril (ZESTRIL) 5 MG tablet Take 1 tablet (5 mg total) by mouth daily. 03/09/21 06/07/21  Camillia Herter, NP  LORazepam (ATIVAN) 1 MG tablet Take 1 tablet (1 mg total) by mouth 3 (three) times daily. Patient not taking: Reported on 04/08/2021 11/09/20   Camillia Herter, NP  metFORMIN (GLUCOPHAGE) 1000 MG tablet Take 1 tablet (1,000 mg total) by mouth 2 (two) times daily with a meal. 03/09/21 06/07/21  Camillia Herter, NP   neomycin-polymyxin-hydrocortisone (CORTISPORIN) 3.5-10000-1 OTIC suspension Place 4 drops into the right ear 3 (three) times daily. 09/11/20   Vanessa Kick, MD  TRUEplus Lancets 28G MISC Use as directed 03/09/21   Camillia Herter, NP    Family History Family History  Problem Relation Age of Onset   Diabetes Father    Diabetes Paternal Grandmother    Diabetes Maternal Grandmother    Mental illness Cousin    Healthy Mother     Social History Social History   Tobacco Use   Smoking status: Former    Types: Cigarettes   Smokeless tobacco: Never  Vaping Use   Vaping Use: Some days   Substances: Nicotine, Flavoring  Substance Use Topics   Alcohol use: Not Currently    Comment: occasional   Drug use: Yes    Types: Marijuana     Allergies   Patient has no known allergies.   Review of Systems Review of Systems  Constitutional:  Negative for fever.  Eyes:  Negative for discharge and redness.  Respiratory:  Negative for shortness of breath.   Neurological:  Positive for speech difficulty. Negative for facial asymmetry.    Physical Exam Triage Vital Signs ED Triage Vitals  Enc Vitals Group     BP 05/06/21 0847 127/84     Pulse Rate 05/06/21 0847 (!) 124     Resp 05/06/21 0847 18     Temp 05/06/21 0847 98.2 F (36.8 C)     Temp Source 05/06/21 0847 Oral     SpO2 05/06/21 0847 98 %     Weight --      Height --      Head Circumference --      Peak Flow --      Pain Score 05/06/21 0849 0     Pain Loc --      Pain Edu? --      Excl. in [place]? --    No data found.  Updated Vital Signs BP 127/84 (BP Location: Right Arm)   Pulse (!) 124   Temp 98.2 F (36.8 C) (Oral)   Resp 18   SpO2 98%      Physical Exam Vitals and nursing note reviewed.  Constitutional:      Comments: Patient with blank stare  Eyes:     Conjunctiva/sclera: Conjunctivae normal.  Cardiovascular:     Rate and Rhythm: Normal rate.  Pulmonary:     Effort: Pulmonary effort is normal.   Neurological:     Mental Status: She is disoriented.  Comments: Patient does not follow simple commands but is observed to be what appears as listening to friend who is with her, she is nonverbal throughout visit     UC Treatments / Results  Labs (all labs ordered are listed, but only abnormal results are displayed) Labs Reviewed - No data to display  EKG   Radiology No results found.  Procedures Procedures (including critical care time)  Medications Ordered in UC Medications - No data to display  Initial Impression / Assessment and Plan / UC Course  I have reviewed the triage vital signs and the nursing notes.  Pertinent labs & imaging results that were available during my care of the patient were reviewed by me and considered in my medical decision making (see chart for details).   EMS called, patient transported to local hospital for further evaluation.   Final Clinical Impressions(s) / UC Diagnoses   Final diagnoses:  Difficulty with speech   Discharge Instructions   None    ED Prescriptions   None    PDMP not reviewed this encounter.   Francene Finders, PA-C 05/06/21 [phone]

## 2021-05-06 NOTE — ED Notes (Signed)
Ems at bedside  
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
## 2021-05-06 NOTE — ED Triage Notes (Signed)
Pt presents non-verbal, confusion, unable to follow reasonable commands. Last known well "yesterday afternoon."

## 2021-05-06 NOTE — Code Documentation (Signed)
Stroke Response Nurse Documentation Code Documentation  Jasmeen Fritsch is a 45 y.o. female arriving to [facility] via Private Vehicle on 05/06/21 with past medical hx of HTN, DM. On No antithrombotic. Code stroke was activated by ED.   Patient from home where she was [place] 05/05/21 at 1200 and now with global aphasia. Was seen by a friend yesterday who couldn't reach her today. Upon the friends arrival at her home again on 05/06/21 patient noticed to not be speaking. Friend drove her to the ED for aphasia  Stroke team at the bedside on patient arrival. Labs drawn and patient cleared for CT by EDP. Patient to CT with team. NIHSS 21, see documentation for details and code stroke times. Patient with Global aphasia  on exam and not following commands. The following imaging was completed: CT, CTA head and neck, CTP. Patient is not a candidate for IV Thrombolytic due to LKW time. Patient is not a candidate for IR due to not suspected for acute event.  Care/Plan: CODE STROKE CANCELLED.   Meda Klinefelter  Stroke Response RN

## 2021-05-06 NOTE — ED Notes (Signed)
Pt sat up in bed and was very distraught, RN asked pt what she needed and she said "I need to pee". Pt placed on purwick and still not following commands or answering questions.

## 2021-05-06 NOTE — ED Notes (Signed)
[facility] called spoke to Phil to activate code stroke, per Dr. Delena Bali request

## 2021-05-06 NOTE — ED Triage Notes (Signed)
Pt bib [facility] from [facility] with complaints of strake like symptoms that started "yesterday afternoon". Pt presents non-verbal and unable to follow commands. Pt does open eyes to voice. Pt friend told UC staff that they saw her at lunch and she was fine then when they called her at dinner, she was not talking.  EMS CBG: 442

## 2021-05-06 NOTE — Progress Notes (Signed)
Inpatient Behavioral Health Placement  Pt meets inpatient criteria per Quintella Reichert, NP. Per [facility] Lavell Luster, RN [facility] has no appropriate beds at this time. Referral was sent to the following facilities:  Destination Service Provider Address Phone Fax  [place]  [address] [place] [address] [phone]  [place], [name] [address] [phone]  [facility]  [address] [place] [address] [phone] [facility]  [address], [place] [address] [phone]  [facility]  [address], [place] [address] [phone] [place]  [address] [place] [address] [phone] [facility]  [address] [place] [address] [phone] [place]  [address] [place] [address] [phone] [facility]  [address], [place] [address] [phone] [facility]  [address], [place] [address] [phone] [place]  B[place], [place] [address] [place]  [facility]  [address], [place] [address] [place]  [facility]  [address] [place] [address] [phone] [facility]  [address] [place] [address] [phone]    Situation ongoing,  CSW will follow up.   Benjaman Kindler, MSW, LCSWA 05/06/2021  @ 11:19 PM

## 2021-05-07 DIAGNOSIS — F250 Schizoaffective disorder, bipolar type: Secondary | ICD-10-CM | POA: Diagnosis not present

## 2021-05-07 LAB — CBG MONITORING, ED
Glucose-Capillary: 298 mg/dL — ABNORMAL HIGH (ref 70–99)
Glucose-Capillary: 290 mg/dL — ABNORMAL HIGH (ref 70–99)
Glucose-Capillary: 295 mg/dL — ABNORMAL HIGH (ref 70–99)
Glucose-Capillary: 237 mg/dL — ABNORMAL HIGH (ref 70–99)

## 2021-05-07 MED ORDER — METFORMIN HCL 500 MG PO TABS
1000.0000 mg | ORAL_TABLET | Freq: Two times a day (BID) | ORAL | Status: DC
  Administered 2021-05-08 (×2): 1000 mg via ORAL
  Filled 2021-05-07 (×2): qty 2

## 2021-05-07 MED ORDER — LORAZEPAM 2 MG/ML IJ SOLN
INTRAMUSCULAR | Status: AC
  Administered 2021-05-07: 2 mg via INTRAVENOUS
  Filled 2021-05-07: qty 1

## 2021-05-07 MED ORDER — LORAZEPAM 1 MG PO TABS
1.0000 mg | ORAL_TABLET | Freq: Once | ORAL | Status: AC
  Administered 2021-05-07: 1 mg via ORAL
  Filled 2021-05-07: qty 1

## 2021-05-07 MED ORDER — INSULIN ASPART 100 UNIT/ML IJ SOLN
0.0000 [IU] | Freq: Three times a day (TID) | INTRAMUSCULAR | Status: DC
  Administered 2021-05-07 (×2): 8 [IU] via SUBCUTANEOUS
  Administered 2021-05-08 (×3): 5 [IU] via SUBCUTANEOUS

## 2021-05-07 MED ORDER — NITROFURANTOIN MONOHYD MACRO 100 MG PO CAPS
100.0000 mg | ORAL_CAPSULE | Freq: Two times a day (BID) | ORAL | Status: DC
  Administered 2021-05-07 – 2021-05-08 (×3): 100 mg via ORAL
  Filled 2021-05-07 (×4): qty 1

## 2021-05-07 MED ORDER — DIVALPROEX SODIUM 250 MG PO DR TAB
250.0000 mg | DELAYED_RELEASE_TABLET | Freq: Two times a day (BID) | ORAL | Status: DC
  Administered 2021-05-07 – 2021-05-08 (×3): 250 mg via ORAL
  Filled 2021-05-07 (×3): qty 1

## 2021-05-07 MED ORDER — ARIPIPRAZOLE 10 MG PO TABS
10.0000 mg | ORAL_TABLET | Freq: Every day | ORAL | Status: DC
  Administered 2021-05-07 – 2021-05-08 (×2): 10 mg via ORAL
  Filled 2021-05-07 (×2): qty 1

## 2021-05-07 MED ORDER — INSULIN ASPART 100 UNIT/ML IJ SOLN
0.0000 [IU] | Freq: Every day | INTRAMUSCULAR | Status: DC
  Administered 2021-05-07: 2 [IU] via SUBCUTANEOUS

## 2021-05-07 MED ORDER — LORAZEPAM 2 MG/ML IJ SOLN: 2.0000 mg | Freq: Once | INTRAMUSCULAR | Status: AC

## 2021-05-07 MED ORDER — METFORMIN HCL 500 MG PO TABS: 1000.0000 mg | ORAL_TABLET | Freq: Two times a day (BID) | ORAL | Status: DC

## 2021-05-07 MED ORDER — LIRAGLUTIDE 18 MG/3ML ~~LOC~~ SOPN: 1.8000 mg | PEN_INJECTOR | Freq: Every day | SUBCUTANEOUS | Status: DC

## 2021-05-07 MED ORDER — LISINOPRIL 10 MG PO TABS
5.0000 mg | ORAL_TABLET | Freq: Every day | ORAL | Status: DC
  Administered 2021-05-07 – 2021-05-08 (×2): 5 mg via ORAL
  Filled 2021-05-07 (×2): qty 1

## 2021-05-07 MED ORDER — DAPAGLIFLOZIN PROPANEDIOL 10 MG PO TABS: 10.0000 mg | ORAL_TABLET | Freq: Every day | ORAL | Status: DC

## 2021-05-07 MED ORDER — ATORVASTATIN CALCIUM 80 MG PO TABS
80.0000 mg | ORAL_TABLET | Freq: Every day | ORAL | Status: DC
  Administered 2021-05-07 – 2021-05-08 (×2): 80 mg via ORAL
  Filled 2021-05-07: qty 2
  Filled 2021-05-07: qty 1

## 2021-05-07 NOTE — ED Notes (Addendum)
Pt alert, NAD, calm, speaking with boyfriend Reggie by phone, asking what happened, asking about "if table is in her butt", "knows she has been here, and hasn't been talking. Not sure what happened before.  Not sure what is happening in her head, knows she has yelled a lot today".

## 2021-05-07 NOTE — ED Notes (Signed)
Steady gait to br

## 2021-05-07 NOTE — ED Notes (Signed)
Placed in bed, rails up, position of sleep, fall risk signs on door, near nurses station in view, calmer, updated.

## 2021-05-07 NOTE — Discharge Instructions (Addendum)
Return for any problem.  ?

## 2021-05-07 NOTE — ED Notes (Addendum)
Meal eaten. Attempting to use phone unsuccessfully. Numbers not going thru. Admits to being confused. Thought unit sounds call bell, were calling her.

## 2021-05-07 NOTE — ED Notes (Signed)
Calmer. Up to b/r, steady gait, asking about loud tubes station and ceiling noises (they are real and present). Eating at [facility]. Macrobid given.

## 2021-05-07 NOTE — ED Notes (Signed)
Sounds of other pts in department increasing this pts anxiousness, fear, paranoia. Reassurred. Updated. White noise provided/ attempted. Snack offered and accepted.

## 2021-05-07 NOTE — ED Notes (Signed)
RN rounding on patient. Pt loud and grimacing. Patient not using clear speech, but pointing to chest and abdomen. EKG performed with ST noted. PRNs given for pain. RN to continue to monitor patient at this time.

## 2021-05-07 NOTE — ED Notes (Signed)
To room from h/w, report received. Pt anxious, agitated, pacing, worrying verbally incessantly, sobbing. EDP notified.

## 2021-05-07 NOTE — ED Notes (Addendum)
Provided white noise for pt comfort. Pt watching TV. Attempting to block out voices and noises which may be unit/ hospital noises versus internal stimuli. Pt believes someone is talking to her, or noises are directed toward her. Noises explained.

## 2021-05-08 DIAGNOSIS — F250 Schizoaffective disorder, bipolar type: Secondary | ICD-10-CM | POA: Diagnosis not present

## 2021-05-08 LAB — BASIC METABOLIC PANEL
Anion gap: 13 (ref 5–15)
BUN: 13 mg/dL (ref 6–20)
CO2: 19 mmol/L — ABNORMAL LOW (ref 22–32)
Calcium: 9.7 mg/dL (ref 8.9–10.3)
Chloride: 101 mmol/L (ref 98–111)
Creatinine, Ser: 0.76 mg/dL (ref 0.44–1.00)
GFR, Estimated: >60 mL/min (ref >60)
Glucose, Bld: 229 mg/dL — ABNORMAL HIGH (ref 70–99)
Potassium: 3.7 mmol/L (ref 3.5–5.1)
Sodium: 133 mmol/L — ABNORMAL LOW (ref 135–145)

## 2021-05-08 LAB — CBG MONITORING, ED
Glucose-Capillary: 215 mg/dL — ABNORMAL HIGH (ref 70–99)
Glucose-Capillary: 232 mg/dL — ABNORMAL HIGH (ref 70–99)
Glucose-Capillary: 239 mg/dL — ABNORMAL HIGH (ref 70–99)

## 2021-05-08 MED ORDER — LORAZEPAM 1 MG PO TABS
1.0000 mg | ORAL_TABLET | Freq: Once | ORAL | Status: AC
  Administered 2021-05-08: 1 mg via ORAL
  Filled 2021-05-08: qty 1

## 2021-05-08 MED ORDER — NITROFURANTOIN MONOHYD MACRO 100 MG PO CAPS: 100.0000 mg | ORAL_CAPSULE | Freq: Two times a day (BID) | ORAL | 0 refills | Status: DC

## 2021-05-08 NOTE — ED Notes (Signed)
Pt up to the br needs no assistgance

## 2021-05-08 NOTE — ED Provider Notes (Signed)
Patient seen at request of nursing staff.  Patient desires to leave AMA.  Patient is currently comfortable and appropriately interactive.  She is oriented to person, place, situation, and time.  She has capacity to refuse care.  She denies any thoughts of self-harm or thoughts of wanting to hurt others.  She reports that she has well-established outpatient resources for continued medical and psychiatric care.  She is advised that staying in the hospital with help [place] resolve her acute issues -however, patient reports that she does not want to be in the hospital.  She would much rather go home and then follow-up with her outpatient resources.   Valarie Merino, MD 05/08/21 [phone]

## 2021-05-08 NOTE — ED Notes (Signed)
Pt stood in her doorway appearing like she needed something. This RN asked pt if she needed anything and pt looked at this nurse and then turned and walked back in her room and shut door.

## 2021-05-08 NOTE — ED Notes (Signed)
Pt came to nurses station tearful asking for a paper and pencil. Asked pt if she needed me to write something down for her. Pt reports she just wants to write a song or something. Pt provided w/ marker and paper.

## 2021-05-08 NOTE — ED Notes (Signed)
Pt pacing around room. Pt requesting pharmacy to see her regarding making sure her medications are correct. Pt says she is receiving more meds than normal. Explained to pt that she is getting an extra capsule d/t abx for UTI. Pt verbalized understanding. Pt also requesting reassessment.

## 2021-05-08 NOTE — ED Notes (Signed)
Pt more alert and able to carry on coherent conversations. Internal stimuli has resolved at this time. Pt does have some delusional ideas. Pt requesting phone to call family. Provided pt with phone.

## 2021-05-08 NOTE — ED Notes (Signed)
Pt having 1st phone call of the day 

## 2021-05-08 NOTE — ED Notes (Signed)
The pt thinks that she may be going home  sometime today

## 2021-05-08 NOTE — ED Notes (Signed)
Pt now requesting to leave states she has therapy 3 times a week that she would rather follow up with. EDP notified and he spoke with pt at bedside. EDP advised pt of recommendation for inpatient treatment. Pt denies any SI/HI. As pt in voluntary, EDP will have pt leave AMA. Pt verbalized understanding to EDP.

## 2021-05-08 NOTE — ED Notes (Signed)
Pt tearful and wailing in room responding to internal stimuli.

## 2021-05-08 NOTE — ED Notes (Signed)
Pt in room crying and talking in room.

## 2021-05-08 NOTE — ED Notes (Signed)
Pt came to doorway and reported that she has someone coming to bring her purse and keys. Explained to pt that per policy, patients under psychiatric holds cannot have their belongings or visitors. Pt not verbalizing understanding and not verbalizing not understanding. Pt w/ flat affect when being told policy and procedure for psychiatric hold pts.

## 2021-05-08 NOTE — ED Provider Notes (Signed)
  Physical Exam  BP (!) 148/82 (BP Location: Right Arm)   Pulse 95   Temp 98.7 F (37.1 C) (Oral)   Resp 16   SpO2 100%   Physical Exam  ED Course/Procedures     Procedures  MDM  Patient reportedly is becoming somewhat more agitated.  Reviewing records urine showed potential infection but was contaminated.  Will attempt to recheck.  Sugars been trending down but still mildly elevated.       Davonna Belling, MD 05/08/21 [phone]

## 2021-05-08 NOTE — ED Notes (Signed)
Pt talking on phone w/ normal speech and normal behavior at this time.

## 2021-05-08 NOTE — ED Notes (Signed)
Pt ambulatory in room w/ steady gait

## 2021-05-08 NOTE — Progress Notes (Signed)
Inpatient Behavioral Health Placement  Pt meets inpatient criteria per Quintella Reichert, NP. Per [place], RN [facility] there are no appropriate beds at [facility].   Referral was sent to the following facilities;   Destination Service Provider Address Phone                                            Fax  [place] [place] [address] [phone]  [place], [place] [address] [phone] [facility]  [place] [facility] [place] [address] [phone] [facility]  [address] [place] [address] [phone]  [facility]  [address], [place] [address] [phone] [place]  [address], [place] [address] [phone] [facility]  [address], [place] [address] [phone] [place]  [address], [place] [address] [phone] [facility]  [address], [place] [address] [phone] [facility]  [address], [place] [address] [place]  [facility]  [address] [place], [place] [address] [place]  [facility]  [address] [place] [address] [place]  [facility]  [address] [place] [address] [phone] [facility]  [address] [place] [address] [facility]  [place], [place] [address] [phone]    Situation ongoing,  CSW will follow up.   Benjaman Kindler, MSW, LCSWA 05/08/2021  @ 5:53 PM

## 2021-05-08 NOTE — ED Notes (Signed)
The pt is constantly on her call bell she has not slept all night she is currently on the phone talking to someone

## 2021-05-08 NOTE — ED Notes (Signed)
Pt spit up water onto floor

## 2021-05-11 ENCOUNTER — Other Ambulatory Visit: Payer: Self-pay | Admitting: Family

## 2021-05-11 ENCOUNTER — Emergency Department (HOSPITAL_COMMUNITY): Admission: EM | Admit: 2021-05-11 | Discharge: 2021-05-12 | Discharge status: Against Medical Advice | Payer: 59

## 2021-05-11 ENCOUNTER — Other Ambulatory Visit: Payer: Self-pay

## 2021-05-11 DIAGNOSIS — E1142 Type 2 diabetes mellitus with diabetic polyneuropathy: Secondary | ICD-10-CM

## 2021-05-11 DIAGNOSIS — F250 Schizoaffective disorder, bipolar type: Secondary | ICD-10-CM

## 2021-05-11 MED ORDER — VICTOZA 18 MG/3ML ~~LOC~~ SOPN: PEN_INJECTOR | SUBCUTANEOUS | 0 refills | Status: DC

## 2021-05-11 MED ORDER — TRUEPLUS PEN NEEDLES 32G X 4 MM MISC: 0 refills | Status: DC

## 2021-05-11 NOTE — Telephone Encounter (Signed)
Liraglutide (Victoza) refilled per patient request. Please schedule appointment for additional refills.   Referral to Psychiatry for management of Lorazepam (Ativan). Their office should call patient within 2 weeks with appointment details.

## 2021-05-11 NOTE — ED Notes (Signed)
No response x1  

## 2021-05-11 NOTE — ED Notes (Signed)
No response to last call

## 2021-05-12 ENCOUNTER — Emergency Department (HOSPITAL_COMMUNITY)
Admission: EM | Admit: 2021-05-12 | Discharge: 2021-05-16 | Disposition: A | Discharge status: Home / Self Care | Payer: 59 | Attending: Emergency Medicine | Admitting: Emergency Medicine

## 2021-05-12 ENCOUNTER — Other Ambulatory Visit: Payer: Self-pay

## 2021-05-12 ENCOUNTER — Encounter (HOSPITAL_COMMUNITY): Payer: Self-pay | Admitting: Emergency Medicine

## 2021-05-12 ENCOUNTER — Emergency Department (HOSPITAL_COMMUNITY): Payer: 59

## 2021-05-12 DIAGNOSIS — F250 Schizoaffective disorder, bipolar type: Secondary | ICD-10-CM | POA: Insufficient documentation

## 2021-05-12 DIAGNOSIS — Z87891 Personal history of nicotine dependence: Secondary | ICD-10-CM | POA: Insufficient documentation

## 2021-05-12 DIAGNOSIS — E119 Type 2 diabetes mellitus without complications: Secondary | ICD-10-CM | POA: Insufficient documentation

## 2021-05-12 DIAGNOSIS — Z7984 Long term (current) use of oral hypoglycemic drugs: Secondary | ICD-10-CM | POA: Insufficient documentation

## 2021-05-12 DIAGNOSIS — Z794 Long term (current) use of insulin: Secondary | ICD-10-CM | POA: Insufficient documentation

## 2021-05-12 DIAGNOSIS — I10 Essential (primary) hypertension: Secondary | ICD-10-CM | POA: Insufficient documentation

## 2021-05-12 DIAGNOSIS — Z20822 Contact with and (suspected) exposure to covid-19: Secondary | ICD-10-CM | POA: Diagnosis not present

## 2021-05-12 DIAGNOSIS — N9489 Other specified conditions associated with female genital organs and menstrual cycle: Secondary | ICD-10-CM | POA: Insufficient documentation

## 2021-05-12 DIAGNOSIS — F309 Manic episode, unspecified: Secondary | ICD-10-CM

## 2021-05-12 DIAGNOSIS — Y900 Blood alcohol level of less than 20 mg/100 ml: Secondary | ICD-10-CM | POA: Diagnosis not present

## 2021-05-12 DIAGNOSIS — Z79899 Other long term (current) drug therapy: Secondary | ICD-10-CM | POA: Insufficient documentation

## 2021-05-12 DIAGNOSIS — Z046 Encounter for general psychiatric examination, requested by authority: Secondary | ICD-10-CM | POA: Diagnosis present

## 2021-05-12 DIAGNOSIS — R4182 Altered mental status, unspecified: Secondary | ICD-10-CM | POA: Insufficient documentation

## 2021-05-12 LAB — CBC WITH DIFFERENTIAL/PLATELET
Abs Immature Granulocytes: 0.02 10*3/uL (ref 0.00–0.07)
Basophils Absolute: 0.1 10*3/uL (ref 0.0–0.1)
Basophils Relative: 1 %
Eosinophils Absolute: 0 10*3/uL (ref 0.0–0.5)
Eosinophils Relative: 0 %
HCT: 33.8 % — ABNORMAL LOW (ref 36.0–46.0)
Hemoglobin: 10.6 g/dL — ABNORMAL LOW (ref 12.0–15.0)
Immature Granulocytes: 0 %
Lymphocytes Relative: 17 %
Lymphs Abs: 1.4 10*3/uL (ref 0.7–4.0)
MCH: 24 pg — ABNORMAL LOW (ref 26.0–34.0)
MCHC: 31.4 g/dL (ref 30.0–36.0)
MCV: 76.5 fL — ABNORMAL LOW (ref 80.0–100.0)
Monocytes Absolute: 0.5 10*3/uL (ref 0.1–1.0)
Monocytes Relative: 5 %
Neutro Abs: 6.7 10*3/uL (ref 1.7–7.7)
Neutrophils Relative %: 77 %
Platelets: 392 10*3/uL (ref 150–400)
RBC: 4.42 MIL/uL (ref 3.87–5.11)
RDW: 17.2 % — ABNORMAL HIGH (ref 11.5–15.5)
WBC: 8.7 10*3/uL (ref 4.0–10.5)
nRBC: 0 % (ref 0.0–0.2)

## 2021-05-12 LAB — COMPREHENSIVE METABOLIC PANEL
ALT: 24 U/L (ref 0–44)
AST: 18 U/L (ref 15–41)
Albumin: 3.4 g/dL — ABNORMAL LOW (ref 3.5–5.0)
Alkaline Phosphatase: 77 U/L (ref 38–126)
Anion gap: 10 (ref 5–15)
BUN: 11 mg/dL (ref 6–20)
CO2: 22 mmol/L (ref 22–32)
Calcium: 9.1 mg/dL (ref 8.9–10.3)
Chloride: 104 mmol/L (ref 98–111)
Creatinine, Ser: 0.78 mg/dL (ref 0.44–1.00)
GFR, Estimated: >60 mL/min (ref >60)
Glucose, Bld: 238 mg/dL — ABNORMAL HIGH (ref 70–99)
Potassium: 3.8 mmol/L (ref 3.5–5.1)
Sodium: 136 mmol/L (ref 135–145)
Total Bilirubin: 1 mg/dL (ref 0.3–1.2)
Total Protein: 6.8 g/dL (ref 6.5–8.1)

## 2021-05-12 LAB — HEMOGLOBIN A1C
Hgb A1c MFr Bld: 9.2 % — ABNORMAL HIGH (ref 4.8–5.6)
Mean Plasma Glucose: 217.34 mg/dL

## 2021-05-12 LAB — I-STAT BETA HCG BLOOD, ED (MC, WL, AP ONLY): I-stat hCG, quantitative: <5 m[IU]/mL (ref <5)

## 2021-05-12 LAB — RESP PANEL BY RT-PCR (FLU A&B, COVID) ARPGX2
Influenza A by PCR: NEGATIVE
Influenza B by PCR: NEGATIVE
SARS Coronavirus 2 by RT PCR: NEGATIVE

## 2021-05-12 LAB — CBG MONITORING, ED
Glucose-Capillary: 235 mg/dL — ABNORMAL HIGH (ref 70–99)
Glucose-Capillary: 174 mg/dL — ABNORMAL HIGH (ref 70–99)
Glucose-Capillary: 237 mg/dL — ABNORMAL HIGH (ref 70–99)

## 2021-05-12 LAB — ETHANOL: Alcohol, Ethyl (B): <10 mg/dL (ref <10)

## 2021-05-12 MED ORDER — ARIPIPRAZOLE 10 MG PO TABS
10.0000 mg | ORAL_TABLET | Freq: Every day | ORAL | Status: DC
  Administered 2021-05-13 – 2021-05-16 (×4): 10 mg via ORAL
  Filled 2021-05-12 (×5): qty 1

## 2021-05-12 MED ORDER — LISINOPRIL 10 MG PO TABS
5.0000 mg | ORAL_TABLET | Freq: Every day | ORAL | Status: DC
  Administered 2021-05-12 – 2021-05-16 (×5): 5 mg via ORAL
  Filled 2021-05-12 (×6): qty 1

## 2021-05-12 MED ORDER — INSULIN ASPART 100 UNIT/ML IJ SOLN
0.0000 [IU] | Freq: Every day | INTRAMUSCULAR | Status: DC
  Administered 2021-05-12 – 2021-05-15 (×4): 2 [IU] via SUBCUTANEOUS

## 2021-05-12 MED ORDER — DIVALPROEX SODIUM 250 MG PO DR TAB
250.0000 mg | DELAYED_RELEASE_TABLET | Freq: Two times a day (BID) | ORAL | Status: DC
  Administered 2021-05-12 – 2021-05-15 (×6): 250 mg via ORAL
  Filled 2021-05-12 (×7): qty 1

## 2021-05-12 MED ORDER — ATORVASTATIN CALCIUM 40 MG PO TABS
80.0000 mg | ORAL_TABLET | Freq: Every day | ORAL | Status: DC
  Administered 2021-05-13 – 2021-05-16 (×4): 80 mg via ORAL
  Filled 2021-05-12 (×5): qty 2

## 2021-05-12 MED ORDER — INSULIN ASPART 100 UNIT/ML IJ SOLN
0.0000 [IU] | Freq: Three times a day (TID) | INTRAMUSCULAR | Status: DC
  Administered 2021-05-12 (×2): 3 [IU] via SUBCUTANEOUS
  Administered 2021-05-13: 8 [IU] via SUBCUTANEOUS
  Administered 2021-05-13: 3 [IU] via SUBCUTANEOUS
  Administered 2021-05-13 – 2021-05-14 (×2): 5 [IU] via SUBCUTANEOUS
  Administered 2021-05-14: 8 [IU] via SUBCUTANEOUS
  Administered 2021-05-14: 11 [IU] via SUBCUTANEOUS
  Administered 2021-05-15: 8 [IU] via SUBCUTANEOUS
  Administered 2021-05-15 (×2): 11 [IU] via SUBCUTANEOUS
  Administered 2021-05-16 (×2): 8 [IU] via SUBCUTANEOUS

## 2021-05-12 MED ORDER — LORAZEPAM 2 MG/ML IJ SOLN
1.0000 mg | Freq: Once | INTRAMUSCULAR | Status: AC
  Administered 2021-05-12: 1 mg via INTRAVENOUS
  Filled 2021-05-12: qty 1

## 2021-05-12 NOTE — ED Notes (Signed)
Pt spit out all her meds and would not swallow. Updated pt's dtr on pt's care.

## 2021-05-12 NOTE — ED Notes (Signed)
Called pt's mother. Updates given.

## 2021-05-12 NOTE — ED Triage Notes (Signed)
Pt here via EMS d/t concerns she is not speaking. Pt left AMA from [facility] 05/11/2021. Pt following commands and moaning. Pt got evicted and staying with friends. Hx of bipolar.   20Lhand 138/80 HR 86 RR 14 99% RA CBG 237

## 2021-05-12 NOTE — ED Notes (Signed)
3 copies IVC paperwork given to RN, 1 medical records, original in red folder

## 2021-05-12 NOTE — ED Notes (Signed)
Pt follows commands, can track with eyes. When asked to speak, pt moans or begins a high pitch humming sound. EMS reports pt was talking with them.

## 2021-05-12 NOTE — ED Notes (Signed)
[place]hel[place], mother, [phone] would like an update when available

## 2021-05-12 NOTE — ED Provider Notes (Addendum)
[facility] EMERGENCY DEPARTMENT Provider Note   CSN: [id number] Arrival date & time: 05/12/21  0731     History Chief Complaint  Patient presents with   Mental Health Problem    [name] is a 45 y.o. female.  LEVEL 5 CAVEAT as patient not answering questions.  EMS brought patient in.  Recently left from behavioral health several days ago.  Patient per EMS is following commands and moaning.  Supposedly friends called EMS.  Patient recently got evicted.  Friends called EMS because they were concerned about her.  She has a history of bipolar.  Patient was here for something similar last week.   Mental Health Problem     Past Medical History:  Diagnosis Date   Abnormal Pap smear    Bipolar 1 disorder ([place])    Diabetes mellitus without complication ([place])    Fibroids    Hypertension    IBS (irritable bowel syndrome)     Patient Active Problem List   Diagnosis Date Noted   Candida vaginitis 04/11/2021   Essential hypertension 04/08/2019   Class 2 severe obesity due to excess calories with serious comorbidity and body mass index (BMI) of 39.0 to 39.9 in adult ([place]) 04/08/2019   Tobacco dependence 04/08/2019   Schizoaffective disorder, bipolar type ([place]) 05/18/2018   Diabetes mellitus ([place]) 08/10/2016   Bipolar disorder, curr episode mixed, severe, with psychotic features ([place]) 08/07/2016   Cannabis use disorder, moderate, dependence ([place]) 08/07/2016   CHOLECYSTITIS, UNSPECIFIED 47/65/4650   BILIARY COLIC 35/46/5681   CERUMEN IMPACTION, BILATERAL 01/17/2008   PHARYNGITIS, VIRAL 01/17/2008   NECK PAIN 01/17/2008    Past Surgical History:  Procedure Laterality Date   CERVICAL BIOPSY     CHOLECYSTECTOMY     ESSURE TUBAL LIGATION     HIATAL HERNIA REPAIR     TUBAL LIGATION       OB History     Gravida  7   Para  2   Term  2   Preterm  0   AB  5   Living         SAB  0   IAB  4   Ectopic  1   Multiple      Live Births               Family History  Problem Relation Age of Onset   Diabetes Father    Diabetes Paternal Grandmother    Diabetes Maternal Grandmother    Mental illness Cousin    Healthy Mother     Social History   Tobacco Use   Smoking status: Former    Types: Cigarettes   Smokeless tobacco: Never  Vaping Use   Vaping Use: Some days   Substances: Nicotine, Flavoring  Substance Use Topics   Alcohol use: Not Currently    Comment: occasional   Drug use: Yes    Types: Marijuana    Home Medications Prior to Admission medications   Medication Sig Start Date End Date Taking? Authorizing Provider  Accu-Chek Softclix Lancets lancets Use to check FSBS BID. Dx: E11.42 03/11/20   [name] Bang, MD  ARIPiprazole (ABILIFY) 10 MG tablet Take 1 tablet (10 mg total) by mouth daily. 11/09/20   Camillia Herter, NP  atorvastatin (LIPITOR) 80 MG tablet Take 1 tablet (80 mg total) by mouth daily. 11/09/20   Camillia Herter, NP  Blood Glucose Monitoring Suppl (ACCU-CHEK GUIDE ME) w/Device KIT Use to check FSBS BID. Dx: E75.17  03/11/20   [name] Bang, MD  Blood Glucose Monitoring Suppl (TRUE METRIX METER) w/Device KIT Use as directed 03/09/21   Camillia Herter, NP  dapagliflozin propanediol (FARXIGA) 10 MG TABS tablet Take 1 tablet (10 mg total) by mouth daily before breakfast. 03/09/21 06/07/21  Camillia Herter, NP  divalproex (DEPAKOTE) 250 MG DR tablet Take 1 tablet (250 mg total) by mouth 2 (two) times daily. 11/09/20   Camillia Herter, NP  glucose blood (TRUE METRIX BLOOD GLUCOSE TEST) test strip Use as instructed 03/09/21   Camillia Herter, NP  Insulin Pen Needle (TRUEPLUS PEN NEEDLES) 32G X 4 MM MISC Use as instructed to inject Victoza daily. 05/11/21   Camillia Herter, NP  liraglutide (VICTOZA) 18 MG/3ML SOPN INJECT 1.8 MG UNDER THE SKIN EVERY DAY 05/11/21   Camillia Herter, NP  lisinopril (ZESTRIL) 5 MG tablet Take 1 tablet (5 mg total) by mouth daily. 03/09/21 06/07/21  Camillia Herter, NP   LORazepam (ATIVAN) 1 MG tablet Take 1 tablet (1 mg total) by mouth 3 (three) times daily. 11/09/20   Camillia Herter, NP  metFORMIN (GLUCOPHAGE) 1000 MG tablet Take 1 tablet (1,000 mg total) by mouth 2 (two) times daily with a meal. 03/09/21 06/07/21  Camillia Herter, NP  neomycin-polymyxin-hydrocortisone (CORTISPORIN) 3.5-10000-1 OTIC suspension Place 4 drops into the right ear 3 (three) times daily. Patient not taking: Reported on 05/08/2021 09/11/20   Vanessa Kick, MD  nitrofurantoin, macrocrystal-monohydrate, (MACROBID) 100 MG capsule Take 1 capsule (100 mg total) by mouth 2 (two) times daily. 05/08/21   Valarie Merino, MD  TRUEplus Lancets 28G MISC Use as directed 03/09/21   Camillia Herter, NP    Allergies    Patient has no known allergies.  Review of Systems   Review of Systems  Unable to perform ROS: Psychiatric disorder   Physical Exam Updated Vital Signs BP (!) 157/94   Pulse (!) 109   Temp 97.8 F (36.6 C) (Oral)   Resp 18   SpO2 100%   Physical Exam Vitals and nursing note reviewed.  Constitutional:      General: She is not in acute distress.    Appearance: She is well-developed. She is not ill-appearing.  HENT:     Head: Normocephalic and atraumatic.     Nose: Nose normal.     Mouth/Throat:     Mouth: Mucous membranes are moist.  Eyes:     Extraocular Movements: Extraocular movements intact.     Conjunctiva/sclera: Conjunctivae normal.     Pupils: Pupils are equal, round, and reactive to light.  Cardiovascular:     Rate and Rhythm: Normal rate and regular rhythm.     Pulses: Normal pulses.     Heart sounds: Normal heart sounds. No murmur heard. Pulmonary:     Effort: Pulmonary effort is normal. No respiratory distress.     Breath sounds: Normal breath sounds.  Abdominal:     Palpations: Abdomen is soft.     Tenderness: There is no abdominal tenderness.  Musculoskeletal:     Cervical back: Normal range of motion and neck supple.  Skin:    General: Skin is  warm and dry.     Capillary Refill: Capillary refill takes less than 2 seconds.  Neurological:     Mental Status: She is alert.     Comments: Patient opens her eyes on her own, she appears to be listening to me and moving all of her extremities, she becomes  intermittently tearful, she appears to be preventing myself from opening up her eyelids to examine her eyes closer, when I raise her extremities up to test for strength testing she pulls them away for me, she is able to lift her head up and look side to side, overall it is difficult to perform examination as patient appears to be working against me    ED Results / Procedures / Treatments   Labs (all labs ordered are listed, but only abnormal results are displayed) Labs Reviewed  COMPREHENSIVE METABOLIC PANEL - Abnormal; Notable for the following components:      Result Value   Glucose, Bld 238 (*)    Albumin 3.4 (*)    All other components within normal limits  CBC WITH DIFFERENTIAL/PLATELET - Abnormal; Notable for the following components:   Hemoglobin 10.6 (*)    HCT 33.8 (*)    MCV 76.5 (*)    MCH 24.0 (*)    RDW 17.2 (*)    All other components within normal limits  RESP PANEL BY RT-PCR (FLU A&B, COVID) ARPGX2  ETHANOL  RAPID URINE DRUG SCREEN, HOSP PERFORMED  HEMOGLOBIN A1C  I-STAT BETA HCG BLOOD, ED ([facility], [facility], [facility] ONLY)    EKG None  Radiology CT HEAD WO CONTRAST (5MM)  Result Date: 05/12/2021 CLINICAL DATA:  Altered mental status EXAM: CT HEAD WITHOUT CONTRAST TECHNIQUE: Contiguous axial images were obtained from the base of the skull through the vertex without intravenous contrast. COMPARISON:  05/06/2021 FINDINGS: Brain: There is patient motion limiting evaluation. Additional images were repeated. Ventricles are not dilated. There is no shift of midline structures. There are no epidural or subdural fluid collections. Cortical sulci are prominent. No significant interval changes are noted. Vascular: Unremarkable Skull:  Unremarkable Sinuses/Orbits: Unremarkable Other: None IMPRESSION: Motion limited study. There are no signs of bleeding within the cranium. No acute intracranial findings are seen. Electronically Signed   By: Elmer Picker M.D.   On: 05/12/2021 10:32    Procedures Procedures   Medications Ordered in ED Medications  LORazepam (ATIVAN) injection 1 mg (has no administration in time range)  insulin aspart (novoLOG) injection 0-15 Units (has no administration in time range)  insulin aspart (novoLOG) injection 0-5 Units (has no administration in time range)  ARIPiprazole (ABILIFY) tablet 10 mg (has no administration in time range)  atorvastatin (LIPITOR) tablet 80 mg (has no administration in time range)  divalproex (DEPAKOTE) DR tablet 250 mg (has no administration in time range)  lisinopril (ZESTRIL) tablet 5 mg (has no administration in time range)    ED Course  I have reviewed the triage vital signs and the nursing notes.  Pertinent labs & imaging results that were available during my care of the patient were reviewed by me and considered in my medical decision making (see chart for details).    MDM Rules/Calculators/A&P                           Pammie Chirino is here with concern for mental health issue.  Patient with history of bipolar.  Arrives with overall unremarkable vitals.  Unable to fully get a history from her as she is unable to speak.  Upon chart review she has had several episodes like this over the last several months.  Last week she was here with the same type of episode where she was refusing to speak and appeared to be under a lot of stress.  She had neurologic work-up  and was ultimately cleared by neurology.  Psychiatry had done initial evaluation but had not fully finished evaluating her when she suddenly asked to leave AMA.  Supposedly she had just been evicted from her home and has been staying with friends.  She is awake and alert and seems to follow commands.  She  appears very agitated and somewhat withdrawn.  She moves all of her extremities.  Her extraocular movements are intact.  Lab work is overall unremarkable.  Head CT unremarkable.  This appears to be psychogenic and seems to be consistent with prior episodes.  This does not appear to be consistent with an acute neurologic process.  Patient will be given a dose of Ativan.  We will have her home meds restarted.  We will have psychiatry evaluate.  Anticipate that if she does start talking and return back to baseline and request to leave that would be reasonable.  This is not seizure activity.  Awaiting psychiatric recommendations.  Medically cleared at this time.  Of note, I talked to her family member on the phone whose number is on her medical chart.  She has been known to have severe panic attacks and anxiety issues similar to today and this was confirmed with her again.  There has been a lot of social stressors going on.  She would like to be informed of her care if possible.  Recommend that maybe psychiatry reach out to her at some point as well.  We will fill out IVC after talking with family and after further chart review it appears that she was recommended for inpatient stay when she was seen here several days ago for the same.  This chart was dictated using voice recognition software.  Despite best efforts to proofread,  errors can occur which can change the documentation meaning.   Final Clinical Impression(s) / ED Diagnoses Final diagnoses:  [facility])    Rx / [place] Orders ED Discharge Orders     None        Lennice Sites, DO 05/12/21 [place], [place], DO 05/12/21 [place], [place], DO 05/12/21 1539

## 2021-05-12 NOTE — ED Notes (Signed)
Received verbal report from Chloe Y RN at this time 

## 2021-05-12 NOTE — ED Notes (Signed)
Pt refusing to cooperate to provide urine.
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
## 2021-05-12 NOTE — ED Notes (Signed)
Placed all pt belongings in locker room in psych- pt has large green bag and large big moving box on floor.

## 2021-05-12 NOTE — ED Notes (Signed)
Pt moved to H20 at this time

## 2021-05-12 NOTE — Progress Notes (Signed)
CSW attempted to speak with patient. Patient would moan, scrunch up her face, and blink. Patient would not speak to CSW.

## 2021-05-12 NOTE — ED Notes (Signed)
Checked patient cbg it was 56 notified RN of blood sugar patient is resting with call bell in reach

## 2021-05-13 DIAGNOSIS — F309 Manic episode, unspecified: Secondary | ICD-10-CM | POA: Diagnosis not present

## 2021-05-13 LAB — CBG MONITORING, ED
Glucose-Capillary: 223 mg/dL — ABNORMAL HIGH (ref 70–99)
Glucose-Capillary: 260 mg/dL — ABNORMAL HIGH (ref 70–99)
Glucose-Capillary: 181 mg/dL — ABNORMAL HIGH (ref 70–99)
Glucose-Capillary: 222 mg/dL — ABNORMAL HIGH (ref 70–99)

## 2021-05-13 MED ORDER — LORAZEPAM 1 MG PO TABS
2.0000 mg | ORAL_TABLET | Freq: Three times a day (TID) | ORAL | Status: DC | PRN
  Administered 2021-05-14 – 2021-05-16 (×4): 2 mg via ORAL
  Filled 2021-05-13 (×4): qty 2

## 2021-05-13 MED ORDER — RISPERIDONE 0.5 MG PO TBDP
0.5000 mg | ORAL_TABLET | Freq: Two times a day (BID) | ORAL | Status: DC
  Administered 2021-05-13 – 2021-05-16 (×6): 0.5 mg via ORAL
  Filled 2021-05-13 (×8): qty 1

## 2021-05-13 MED ORDER — ZIPRASIDONE HCL 20 MG PO CAPS
20.0000 mg | ORAL_CAPSULE | Freq: Once | ORAL | Status: AC
  Administered 2021-05-13: 20 mg via ORAL
  Filled 2021-05-13: qty 1

## 2021-05-13 NOTE — ED Provider Notes (Signed)
Emergency Medicine Observation Re-evaluation Note  [name] is a 45 y.o. female, seen on rounds today.  Pt initially presented to the ED for complaints of Mental Health Problem and Psychiatric Evaluation Currently, the patient is IVC and to be admitted by psych.  Physical Exam  BP 135/85 (BP Location: Left Arm)   Pulse 85   Temp 99.1 F (37.3 C) (Oral)   Resp 15   SpO2 99%  Physical Exam Vitals and nursing note reviewed.  Constitutional:      Appearance: Normal appearance.  HENT:     Head: Normocephalic.     Right Ear: External ear normal.     Left Ear: External ear normal.     Nose: Nose normal.  Neurological:     Mental Status: She is alert.   General: wdwn Cardiac: rrr Lungs: cta Psych: si,   ED Course / MDM  EKG:EKG Interpretation  Date/Time:  Thursday May 12 2021 08:38:25 EST Ventricular Rate:  103 PR Interval:  141 QRS Duration: 91 QT Interval:  350 QTC Calculation: 459 R Axis:   0 Text Interpretation: Sinus tachycardia Confirmed by Ronnald Nian, Adam ([id number]) on 05/12/2021 1:28:39 PM  I have reviewed the labs performed to date as well as medications administered while in observation.  Recent changes in the last 24 hours include psych evaluated for hospitalization.  Plan  Current plan is for admission. [name] is under involuntary commitment.      Alyssa Boss, MD 05/13/21 [phone]

## 2021-05-13 NOTE — ED Notes (Signed)
Pt calm and cooperative, asleep on the stretcher.

## 2021-05-13 NOTE — Progress Notes (Signed)
Inpatient Diabetes Program Recommendations  AACE/ADA: New Consensus Statement on Inpatient Glycemic Control (2015)  Target Ranges:  Prepandial:   less than 140 mg/dL      Peak postprandial:   less than 180 mg/dL (1-2 hours)      Critically ill patients:  140 - 180 mg/dL   Lab Results  Component Value Date   GLUCAP 260 (H) 05/13/2021   HGBA1C 9.2 (H) 05/12/2021    Review of Glycemic Control Results for RAYEN, PALEN (MRN [id number]) as of 05/13/2021 14:03  Ref. Range 05/12/2021 18:06 05/12/2021 22:26 05/13/2021 07:22 05/13/2021 12:34  Glucose-Capillary Latest Ref Range: 70 - 99 mg/dL 174 (H) 237 (H) 223 (H) 260 (H)   Diabetes history: Type 2 DM Outpatient Diabetes medications: Metformin 1000 mg BID, Victoza 1.8 mg QD Current orders for Inpatient glycemic control: Novolog 0-15 units TID & HS  Inpatient Diabetes Program Recommendations:    Consider: - adding Semglee 16 units QD -Changing diet to carb modified -Metformin 1000 mg BID  Thanks, Bronson Curb, MSN, RNC-OB Diabetes Coordinator [phone] (8a-5p)

## 2021-05-13 NOTE — ED Notes (Signed)
Pt currently sleeping. NO acute distress. Safety precautions maintained with sitter 1:1.

## 2021-05-13 NOTE — ED Notes (Signed)
Pt stated that she was hungry. Pt given a [place] sandwich. Pt had to be coached and reminded that she had a sandwich in her hands and to put it to her mouth and take a bite.   Pt was confused and tearful at times. Pt stated "I am so hungry, can I have food" while holding sandwich. Pt also tearfully asked what she was supposed to do net. Writer redirected patient and asked pt why was she upset. PT tearfully responded "I don't know. All I know is that my family loves me and wants the best for me"

## 2021-05-13 NOTE — ED Notes (Signed)
Pt is restless and continues to try to wander and walk the hallways. Pt redirected after several attempts, will continue to monitor.

## 2021-05-13 NOTE — ED Notes (Signed)
Pt continues to try to wander and walk the hallways. Redirection is unsuccessful. Charge RN Gretta Cool made aware pt may need a sitter.

## 2021-05-13 NOTE — ED Notes (Signed)
Pt growing increasingly restless, attempting to get up and walk around the ED. Pt was able to be redirected back to the stretcher by writer but was mildly agitated, stating "I just want to walk. Leave me alone" When asked where she was attempting to go, pt was unable to provide an answer.   Pt requiring more redirection and deescalation. RN notified. Will continue to monitor.

## 2021-05-13 NOTE — ED Notes (Signed)
Pt has lunch tray at the bedside, but requires frequent redirection back to meal tray and eating. Pt attention easily diverted and appears to be responding to internal stimuli. RN aware, will continue to monitor.

## 2021-05-13 NOTE — Consult Note (Signed)
[facility] Face-to-Face Psychiatry Consult   Reason for Consult:  Psychosis Referring Physician:  EDP Patient Identification: [name] MRN:  [id number] Principal Diagnosis: <principal problem not specified> Diagnosis:  Active Problems:   * No active hospital problems. *   Total Time spent with patient: 30 minutes  Subjective:   [name] is a 45 y.o. female patient admitted with depression with psychotic features, worsening suicidal ideations, intermittent episodes of psychosis.  Patient is seen and reassessed by the psychiatric nurse practitioner.  Patient is a poor historian. She appears to be alert yet disoriented, confused, however pleasant upon awakening.  Patient is unable to recall her reason for admission.  However when she is awake she begins to answer more questions appropriately, although she still appears to be disoriented.  She is unable to have a linear conversation at this time and presents with flight of ideas.  Although not present during this evaluation she was observed earlier this morning with some delusional thought disorder, exhibiting religious beliefs, and hallucinations.  Although patient denies at this time. She continues to endorse suicidal ideations. She denies any homicidal ideations and or hallucinations.   HPI:  Patient reporting suicidal thoughts. States that her suicidal thoughts started last week, worsened yesterday. Patient reporting a plan to "run out, in front of a moving car". She denies a history suicidal attempts and/or gesture. She is focused throughout the TTS assessment on not being discharging home. States several times, "Don't send me home", "I don't want to go back", "Send me to the hospital".    Upon chart review patient has presented to the [facility]: 09/24/2019 @ [facility] for suicidal ideations, 10/21/2019 @ [place] for suicidal ideations, 10/29/2019 '@[facility]'  for suicidal ideations, 11/06/2019 @ [facility] for suicidal ideations.    Her suicidal  thoughts started at the age of 45 yrs old, "Maybe earlier, I can't really remember". Her suicidal thoughts are seemingly chronic evidence by multiple ED visits within the [facility] system, and her reports of daily suicidal thoughts since the age of 45 yrs old. She says that her suicidal thoughts are mostly passive. However, due to stressors, "I want to die".  Past Psychiatric History: MDD w/psychosis.  Bipolar 1 disorder.  Current psychiatric medication includes Abilify 10 mg p.o. daily.  Risk to Self: Yes Risk to Others: Denies Prior Inpatient Therapy: No recent Prior Outpatient Therapy: Unable to obtain  Past Medical History:  Past Medical History:  Diagnosis Date   Abnormal Pap smear    Bipolar 1 disorder ([place])    Diabetes mellitus without complication (HCC)    Fibroids    Hypertension    IBS (irritable bowel syndrome)     Past Surgical History:  Procedure Laterality Date   CERVICAL BIOPSY     CHOLECYSTECTOMY     ESSURE TUBAL LIGATION     HIATAL HERNIA REPAIR     TUBAL LIGATION     Family History:  Family History  Problem Relation Age of Onset   Diabetes Father    Diabetes Paternal Grandmother    Diabetes Maternal Grandmother    Mental illness Cousin    Healthy Mother    Family Psychiatric  History: Unable to obtain Social History:  Social History   Substance and Sexual Activity  Alcohol Use Not Currently   Comment: occasional     Social History   Substance and Sexual Activity  Drug Use Yes   Types: Marijuana    Social History   Socioeconomic History   Marital  status: Single    Spouse name: Not on file   Number of children: 2   Years of education: Not on file   Highest education level: Master's degree (e.g., MA, MS, MEng, MEd, MSW, MBA)  Occupational History   Not on file  Tobacco Use   Smoking status: Former    Types: Cigarettes   Smokeless tobacco: Never  Vaping Use   Vaping Use: Some days   Substances: Nicotine, Flavoring  Substance and  Sexual Activity   Alcohol use: Not Currently    Comment: occasional   Drug use: Yes    Types: Marijuana   Sexual activity: Not Currently    Birth control/protection: Surgical  Other Topics Concern   Not on file  Social History Narrative   ** Merged History Encounter **       Social Determinants of Health   Financial Resource Strain: Not on file  Food Insecurity: Not on file  Transportation Needs: Not on file  Physical Activity: Not on file  Stress: Not on file  Social Connections: Not on file   Additional Social History:    Allergies:  No Known Allergies  Labs:  Results for orders placed or performed during the hospital encounter of 05/12/21 (from the past 48 hour(s))  Comprehensive metabolic panel     Status: Abnormal   Collection Time: 05/12/21  8:13 AM  Result Value Ref Range   Sodium 136 135 - 145 mmol/L   Potassium 3.8 3.5 - 5.1 mmol/L   Chloride 104 98 - 111 mmol/L   CO2 22 22 - 32 mmol/L   Glucose, Bld 238 (H) 70 - 99 mg/dL    Comment: Glucose reference range applies only to samples taken after fasting for at least 8 hours.   BUN 11 6 - 20 mg/dL   Creatinine, Ser 0.78 0.44 - 1.00 mg/dL   Calcium 9.1 8.9 - 10.3 mg/dL   Total Protein 6.8 6.5 - 8.1 g/dL   Albumin 3.4 (L) 3.5 - 5.0 g/dL   AST 18 15 - 41 U/L   ALT 24 0 - 44 U/L   Alkaline Phosphatase 77 38 - 126 U/L   Total Bilirubin 1.0 0.3 - 1.2 mg/dL   GFR, Estimated >60 >60 mL/min    Comment: (NOTE) Calculated using the CKD-EPI Creatinine Equation (2021)    Anion gap 10 5 - 15    Comment: Performed at [facility] [address] [place], [place] [address]  CBC with Diff     Status: Abnormal   Collection Time: 05/12/21  8:13 AM  Result Value Ref Range   WBC 8.7 4.0 - 10.5 K/uL   RBC 4.42 3.87 - 5.11 MIL/uL   Hemoglobin 10.6 (L) 12.0 - 15.0 g/dL   HCT 33.8 (L) 36.0 - 46.0 %   MCV 76.5 (L) 80.0 - 100.0 fL   MCH 24.0 (L) 26.0 - 34.0 pg   MCHC 31.4 30.0 - 36.0 g/dL   RDW 17.2 (H) 11.5 - 15.5 %    Platelets 392 150 - 400 K/uL   nRBC 0.0 0.0 - 0.2 %   Neutrophils Relative % 77 %   Neutro Abs 6.7 1.7 - 7.7 K/uL   Lymphocytes Relative 17 %   Lymphs Abs 1.4 0.7 - 4.0 K/uL   Monocytes Relative 5 %   Monocytes Absolute 0.5 0.1 - 1.0 K/uL   Eosinophils Relative 0 %   Eosinophils Absolute 0.0 0.0 - 0.5 K/uL   Basophils Relative 1 %  Basophils Absolute 0.1 0.0 - 0.1 K/uL   Immature Granulocytes 0 %   Abs Immature Granulocytes 0.02 0.00 - 0.07 K/uL    Comment: Performed at [facility], [place] [address], [place], [place] [address]  Hemoglobin A1c     Status: Abnormal   Collection Time: 05/12/21  8:13 AM  Result Value Ref Range   Hgb A1c MFr Bld 9.2 (H) 4.8 - 5.6 %    Comment: (NOTE) Pre diabetes:          5.7%-6.4%  Diabetes:              >6.4%  Glycemic control for   <7.0% adults with diabetes    Mean Plasma Glucose 217.34 mg/dL    Comment: Performed at [facility] [address], [place], [place] [address]  Ethanol     Status: None   Collection Time: 05/12/21  8:39 AM  Result Value Ref Range   Alcohol, Ethyl (B) <10 <10 mg/dL    Comment: (NOTE) Lowest detectable limit for serum alcohol is 10 mg/dL.  For medical purposes only. Performed at [facility], [place] [address], [place], [place] [address]   Resp Panel by RT-PCR (Flu A&B, Covid) Nasopharyngeal Swab     Status: None   Collection Time: 05/12/21  8:39 AM   Specimen: Nasopharyngeal Swab; Nasopharyngeal(NP) swabs in vial transport medium  Result Value Ref Range   SARS Coronavirus 2 by RT PCR NEGATIVE NEGATIVE    Comment: (NOTE) SARS-CoV-2 target nucleic acids are NOT DETECTED.  The SARS-CoV-2 RNA is generally detectable in upper respiratory specimens during the acute phase of infection. The lowest concentration of SARS-CoV-2 viral copies this assay can detect is 138 copies/mL. A negative result does not preclude SARS-Cov-2 infection and should not be used as the sole basis for treatment  or other patient management decisions. A negative result may occur with  improper specimen collection/handling, submission of specimen other than nasopharyngeal swab, presence of viral mutation(s) within the areas targeted by this assay, and inadequate number of viral copies(<138 copies/mL). A negative result must be combined with clinical observations, patient history, and epidemiological information. The expected result is Negative.  Fact Sheet for Patients:  EntrepreneurPulse.com.au  Fact Sheet for Healthcare Providers:  IncredibleEmployment.be  This test is no t yet approved or cleared by the [place] FDA and  has been authorized for detection and/or diagnosis of SARS-CoV-2 by FDA under an Emergency Use Authorization (EUA). This EUA will remain  in effect (meaning this test can be used) for the duration of the COVID-19 declaration under Section 564(b)(1) of the Act, 21 U.S.C.section 360bbb-3(b)(1), unless the authorization is terminated  or revoked sooner.       Influenza A by PCR NEGATIVE NEGATIVE   Influenza B by PCR NEGATIVE NEGATIVE    Comment: (NOTE) The Xpert Xpress SARS-CoV-2/FLU/RSV plus assay is intended as an aid in the diagnosis of influenza from Nasopharyngeal swab specimens and should not be used as a sole basis for treatment. Nasal washings and aspirates are unacceptable for Xpert Xpress SARS-CoV-2/FLU/RSV testing.  Fact Sheet for Patients: EntrepreneurPulse.com.au  Fact Sheet for Healthcare Providers: IncredibleEmployment.be  This test is not yet approved or cleared by the [place] FDA and has been authorized for detection and/or diagnosis of SARS-CoV-2 by FDA under an Emergency Use Authorization (EUA). This EUA will remain in effect (meaning this test can be used) for the duration of the COVID-19 declaration under Section 564(b)(1) of the Act,  21 U.S.C. section  360bbb-3(b)(1), unless the authorization is terminated or revoked.  Performed at [facility], [place] [address] [place], [place] [address]   I-Stat beta hCG blood, ED     Status: None   Collection Time: 05/12/21  9:01 AM  Result Value Ref Range   I-stat hCG, quantitative <5.0 <5 mIU/mL   Comment 3            Comment:   GEST. AGE      CONC.  (mIU/mL)   <=1 WEEK        5 - 50     2 WEEKS       50 - 500     3 WEEKS       100 - 10,000     4 WEEKS     1,000 - 30,000        FEMALE AND NON-PREGNANT FEMALE:     LESS THAN 5 mIU/mL   CBG monitoring, ED     Status: Abnormal   Collection Time: 05/12/21 12:02 PM  Result Value Ref Range   Glucose-Capillary 235 (H) 70 - 99 mg/dL    Comment: Glucose reference range applies only to samples taken after fasting for at least 8 hours.  CBG monitoring, ED     Status: Abnormal   Collection Time: 05/12/21  6:06 PM  Result Value Ref Range   Glucose-Capillary 174 (H) 70 - 99 mg/dL    Comment: Glucose reference range applies only to samples taken after fasting for at least 8 hours.  CBG monitoring, ED     Status: Abnormal   Collection Time: 05/12/21 10:26 PM  Result Value Ref Range   Glucose-Capillary 237 (H) 70 - 99 mg/dL    Comment: Glucose reference range applies only to samples taken after fasting for at least 8 hours.  CBG monitoring, ED     Status: Abnormal   Collection Time: 05/13/21  7:22 AM  Result Value Ref Range   Glucose-Capillary 223 (H) 70 - 99 mg/dL    Comment: Glucose reference range applies only to samples taken after fasting for at least 8 hours.    Current Facility-Administered Medications  Medication Dose Route Frequency Provider Last Rate Last Admin   ARIPiprazole (ABILIFY) tablet 10 mg  10 mg Oral Daily Curatolo, Adam, DO   10 mg at 05/13/21 0909   atorvastatin (LIPITOR) tablet 80 mg  80 mg Oral Daily Curatolo, Adam, DO   80 mg at 05/13/21 0909   divalproex (DEPAKOTE) DR tablet 250 mg  250 mg Oral BID Curatolo, Adam, DO    250 mg at 05/13/21 4562   insulin aspart (novoLOG) injection 0-15 Units  0-15 Units Subcutaneous TID WC Curatolo, Adam, DO   5 Units at 05/13/21 0910   insulin aspart (novoLOG) injection 0-5 Units  0-5 Units Subcutaneous QHS Curatolo, Adam, DO   2 Units at 05/12/21 2229   lisinopril (ZESTRIL) tablet 5 mg  5 mg Oral Daily Curatolo, Adam, DO   5 mg at 05/13/21 0910   risperiDONE (RISPERDAL M-TABS) disintegrating tablet 0.5 mg  0.5 mg Oral BID Suella Broad, FNP       Current Outpatient Medications  Medication Sig Dispense Refill   ARIPiprazole (ABILIFY) 10 MG tablet Take 1 tablet (10 mg total) by mouth daily. 30 tablet 0   atorvastatin (LIPITOR) 80 MG tablet Take 1 tablet (80 mg total) by mouth daily. 90 tablet 0   dapagliflozin propanediol (FARXIGA) 10 MG TABS tablet Take  1 tablet (10 mg total) by mouth daily before breakfast. 90 tablet 0   divalproex (DEPAKOTE) 250 MG DR tablet Take 1 tablet (250 mg total) by mouth 2 (two) times daily. 60 tablet 0   liraglutide (VICTOZA) 18 MG/3ML SOPN INJECT 1.8 MG UNDER THE SKIN EVERY DAY (Patient taking differently: Inject 1.8 mg into the skin daily.) 9 mL 0   lisinopril (ZESTRIL) 5 MG tablet Take 1 tablet (5 mg total) by mouth daily. 90 tablet 0   LORazepam (ATIVAN) 1 MG tablet Take 1 tablet (1 mg total) by mouth 3 (three) times daily. 90 tablet 0   metFORMIN (GLUCOPHAGE) 1000 MG tablet Take 1 tablet (1,000 mg total) by mouth 2 (two) times daily with a meal. 180 tablet 0   nitrofurantoin, macrocrystal-monohydrate, (MACROBID) 100 MG capsule Take 1 capsule (100 mg total) by mouth 2 (two) times daily. (Patient taking differently: Take 100 mg by mouth See admin instructions. Bid x 5 days) 10 capsule 0   Accu-Chek Softclix Lancets lancets Use to check FSBS BID. Dx: E11.42 100 each 2   Blood Glucose Monitoring Suppl (ACCU-CHEK GUIDE ME) w/Device KIT Use to check FSBS BID. Dx: E11.42 1 kit 0   Blood Glucose Monitoring Suppl (TRUE METRIX METER) w/Device KIT  Use as directed 1 kit 0   glucose blood (TRUE METRIX BLOOD GLUCOSE TEST) test strip Use as instructed 100 each 12   Insulin Pen Needle (TRUEPLUS PEN NEEDLES) 32G X 4 MM MISC Use as instructed to inject Victoza daily. 100 each 0   neomycin-polymyxin-hydrocortisone (CORTISPORIN) 3.5-10000-1 OTIC suspension Place 4 drops into the right ear 3 (three) times daily. (Patient not taking: No sig reported) 10 mL 0   TRUEplus Lancets 28G MISC Use as directed 100 each 1    Musculoskeletal: Strength & Muscle Tone: within normal limits Gait & Station: normal Patient leans: N/A            Psychiatric Specialty Exam:  Presentation  General Appearance: Bizarre; Disheveled (Patient diaphoretic)  Eye Contact:Poor  Speech:Garbled  Speech Volume:Decreased  Handedness:No data recorded  Mood and Affect  Mood:-- (Unable to assess)  Affect:Tearful; Labile (Patient is very tearful at times and hysterically laughing at other times.)   Thought Process  Thought Processes:Disorganized  Descriptions of Associations:Loose  Orientation:-- (Patient alert and oriented to name, month and year, but not oriented to location or day of the week.  Patient also states that she is not sure where her and her boyfriend live.)  Thought Content:Scattered; Paranoid Ideation  History of Schizophrenia/Schizoaffective disorder:Yes  Duration of Psychotic Symptoms:Greater than six months  Hallucinations:No data recorded Ideas of Reference:Paranoia  Suicidal Thoughts:No data recorded Homicidal Thoughts:No data recorded  Sensorium  Memory:-- (Unable to be assessed)  Judgment:Poor  Insight:Poor   Executive Functions  Concentration:Poor  Attention Span:Poor  [place] of Knowledge:Poor  Language:Poor   Psychomotor Activity  Psychomotor Activity:No data recorded  Assets  Assets:Housing; Financial Resources/Insurance; Desire for Improvement; Physical Health; Leisure Time   Sleep   Sleep:No data recorded  Physical Exam: Physical Exam ROS Blood pressure 128/80, pulse 89, temperature 97.8 F (36.6 C), temperature source Oral, resp. rate 15, SpO2 98 %. There is no height or weight on file to calculate BMI.  Treatment Plan Summary: Daily contact with patient to assess and evaluate symptoms and progress in treatment, Medication management, and Plan will start risperidone 0.5 mg p.o. twice daily to further target psychosis, hallucinations, delusional thought disorder.  Continue taking home medication Abilify 10 mg  p.o. daily will likely benefit from maximizing this dose once in an inpatient setting.  [facility]  Continue working closely with LCSW to refer to other inpatient facilities.  Disposition: Recommend psychiatric Inpatient admission when medically cleared.  Suella Broad, FNP 05/13/2021 11:07 AM

## 2021-05-13 NOTE — ED Notes (Signed)
Pt woke up worried and anxious at this time. Pt states, "I feel like people doesn't look like how they used to look." Pt is delusional and stating she is worried about her son. Very impaired in judgement. Alert and oriented x 2. Safety precautions maintained. IV removed as pt attempted to remove line. Will continue to monitor.

## 2021-05-13 NOTE — ED Notes (Signed)
Ambulated to restroom  

## 2021-05-13 NOTE — ED Notes (Signed)
Pt dinner Tray at bedside

## 2021-05-13 NOTE — ED Notes (Signed)
Sitter removed at this time

## 2021-05-13 NOTE — ED Notes (Signed)
Pt woke up and is very suspicious. Pt has been whispering at this time and appears to be responding to internal stimuli. Safety precautions maintained. Took meds with some coaching. Will continue to monitor.

## 2021-05-13 NOTE — ED Notes (Signed)
Pt started wandering in the other unit. Pt was redirected back to room by staff. Pt continues to talk about religious beliefs. Pt is still delusional at this time. Cooperative to taking meds. Safety precautions maintained with 1:1 sitter at bedside. Will continue to monitor.

## 2021-05-13 NOTE — Progress Notes (Signed)
Per [facility] AC, Clayborne Dana, RN pt is being reviewed for [facility]. CSW will assist and follow for placement needs.    Benjaman Kindler, MSW, [facility] 05/13/2021 11:33 PM

## 2021-05-13 NOTE — ED Notes (Signed)
Pt awake talking to herself. And asking questions about what is going on and what is happening. Per NT pt is also seeing things

## 2021-05-14 DIAGNOSIS — F309 Manic episode, unspecified: Secondary | ICD-10-CM | POA: Diagnosis not present

## 2021-05-14 LAB — CBG MONITORING, ED
Glucose-Capillary: 263 mg/dL — ABNORMAL HIGH (ref 70–99)
Glucose-Capillary: 245 mg/dL — ABNORMAL HIGH (ref 70–99)
Glucose-Capillary: 346 mg/dL — ABNORMAL HIGH (ref 70–99)
Glucose-Capillary: 249 mg/dL — ABNORMAL HIGH (ref 70–99)

## 2021-05-14 NOTE — ED Notes (Signed)
Pt eating breakfast sitter remains at bedside.

## 2021-05-15 DIAGNOSIS — F309 Manic episode, unspecified: Secondary | ICD-10-CM | POA: Diagnosis not present

## 2021-05-15 LAB — CBG MONITORING, ED
Glucose-Capillary: 227 mg/dL — ABNORMAL HIGH (ref 70–99)
Glucose-Capillary: 262 mg/dL — ABNORMAL HIGH (ref 70–99)
Glucose-Capillary: 316 mg/dL — ABNORMAL HIGH (ref 70–99)
Glucose-Capillary: 336 mg/dL — ABNORMAL HIGH (ref 70–99)

## 2021-05-15 LAB — RAPID URINE DRUG SCREEN, HOSP PERFORMED
Amphetamines: NOT DETECTED
Barbiturates: NOT DETECTED
Benzodiazepines: POSITIVE — AB
Cocaine: NOT DETECTED
Opiates: NOT DETECTED
Tetrahydrocannabinol: POSITIVE — AB

## 2021-05-15 MED ORDER — OLANZAPINE 5 MG PO TABS
5.0000 mg | ORAL_TABLET | Freq: Once | ORAL | Status: AC | PRN
  Administered 2021-05-15: 5 mg via ORAL
  Filled 2021-05-15: qty 1

## 2021-05-15 MED ORDER — DIVALPROEX SODIUM 250 MG PO DR TAB
500.0000 mg | DELAYED_RELEASE_TABLET | Freq: Two times a day (BID) | ORAL | Status: DC
  Administered 2021-05-15 – 2021-05-16 (×2): 500 mg via ORAL
  Filled 2021-05-15 (×2): qty 2

## 2021-05-15 NOTE — ED Notes (Signed)
Pt stated to security this RN is making up the rules as she goes along. Pt continues , I have been in a lot of mental hospitals and I can use the phone anytime I want. It was again explained to pt this is still the ER and she is awaiting placement.

## 2021-05-15 NOTE — ED Notes (Signed)
Pt made both of her dailey allotted phone calls. Pt verbalized understanding

## 2021-05-15 NOTE — ED Notes (Signed)
Patient was found walking around and asking the patient in room 34 to call someone for her. Explained to patient we cant walk into other peoples rooms and asked her to go back her bed. Patient stood near room for awhile and eventually walked back to her bed.

## 2021-05-15 NOTE — ED Notes (Signed)
Pt requested to call daughter due to an emergency. This RN dialed phone number for patient and reminded pt no other phone calls are to be made after midnight. Pt spoke with daughter for about 3 minutes. Pt's daughter called this RN back after phone call was completed and reported that her mother has reached out to her 3 times already, reports that she is making inappropriate statements on phone and wondering why pt has not been transferred to [facility]. Pt's daughter updated on bed status and reassured pt will be unable to contact pt for the rest of the night. Sitter at bedside. Pt denies pain.

## 2021-05-15 NOTE — ED Notes (Signed)
Pt sitting in hallway , asked family in room 30 for use of their phone. Secretary came to inform this RN that pt had a phone. This RN went to pt and took phone immediatly from pt and gave it back to room 30 and stated she is not allowed to use phones.  Pt very upset about this RN taking phone that was not hers . Security called. Pt crying about not being able to make important phone calls. Pt is aware of phone policy. Pt is frustrated by being in the hallway. Pt has no outlite, unable to walk outside.   Pt has no sitter. Pt does better with a sitter at bedside for someone to converse with. Pt returned to bed and is sitting quietly.

## 2021-05-15 NOTE — Progress Notes (Signed)
[name] with [facility] who is requesting IVC paperwork, a negative pregnancy test, and drug screen to be faxed for further review. Fax number [phone]. CSW will facilitate faxing negative pregnancy test and drug screen.     Glennie Isle, MSW, [place], LCAS-A Phone: [phone] Disposition/TOC

## 2021-05-15 NOTE — ED Notes (Signed)
Paperwork requested by possible accepting facility faxed by [facility] NT

## 2021-05-15 NOTE — ED Notes (Signed)
Pt given 10 minutes to sit in another location for a "break" from her hallway, which is understandable.  Pt calm and redirectable and took her medications willingly.

## 2021-05-15 NOTE — ED Notes (Signed)
Another copy of phone policy provided to pt. Pt sitting and reading policy.

## 2021-05-15 NOTE — ED Notes (Signed)
Pt on the phone, has increased agitation and tearfulness.

## 2021-05-15 NOTE — ED Notes (Addendum)
Pt wandering in the halls, saying she is upset about her treatment and needs to go. RN explained that we are awaiting placement. Pt still sitting in hallway, stating she doesn't want to sit in her hallway bed.

## 2021-05-15 NOTE — ED Notes (Signed)
Pt irritated and stating that she can leave anytime she wants. Pt attempted to leave out EMS bay doors. Stopped by staff and security called. Pt walked back to hallway bed. Policy of phone calls and inpatient placement explained to pt multiple times. Pt now sitting on bed.

## 2021-05-15 NOTE — Progress Notes (Signed)
Per Lewanda Rife, patient meets criteria for inpatient treatment. There are no available or appropriate beds at [facility] today, per [facility]. CSW faxed referrals to the following facilities for review:  [facility]  Pending - No Request Sent N/A [address], [place] [address] [phone] --  [facility]  Pending - No Request Sent N/A [address], [place] [address] [phone] --  [facility]  Pending - No Request [facility] [address] [place] [address] [phone] --  [address] [place] [address] [phone] --  [facility]  Pending - No Request Sent N/A [address] [place] [address] [phone] --  [facility]  Pending - No Request Sent N/A [address] [place], [place] [address] [phone] --  [facility]  Pending - No Request Sent N/A [address] [place] [address] [place] --  [facility]  Pending - No Request Sent N/A [address], [place] [address] [facility]  Pending - No Request Sent N/A [address] [place] [address] [phone] --  [facility]  Pending - No Request Sent N/A [address] [place] [address] [phone] --  [place]  Pending - No Request Sent N/A [address] [place] [address] [phone] --  [facility]  Pending - No Request Sent N/A [address] [place] [address] [phone] --  [place]  Pending - No Request Sent N/A [address] [place] [place] [address] [phone] --  [facility]  Pending - No Request Sent N/A [address] [place] [address] [phone] --  [facility]  Pending - No Request Sent N/A [address], [place] [address] [phone] --  [facility]  Pending - No Request Sent N/A [place], [place] [address] [phone] --   TTS will continue to seek bed placement.  Glennie Isle, MSW, [place], LCAS-A Phone: [phone] Disposition/TOC

## 2021-05-15 NOTE — ED Notes (Signed)
Pt states she has 3 masters degrees, makes and stars in movies. Pt took medication without issue. Pt sitting in chair next to bed.

## 2021-05-15 NOTE — ED Notes (Signed)
Pt now decided she would change into the appropriate BH scrubs instead of wearing hospital gown. Pt wrote a letter stating she is ready for discharge.

## 2021-05-15 NOTE — ED Notes (Signed)
Pt attempted to follow another pt into restroom. This RN intervened and stated pt can use a different restroom or wait her turn. Pt turned around calmly and went back and sat on her bed.

## 2021-05-16 ENCOUNTER — Other Ambulatory Visit: Payer: Self-pay

## 2021-05-16 ENCOUNTER — Encounter (HOSPITAL_COMMUNITY): Payer: Self-pay | Admitting: Psychiatry

## 2021-05-16 ENCOUNTER — Inpatient Hospital Stay (HOSPITAL_COMMUNITY)
Admission: AD | Admit: 2021-05-16 | Discharge: 2021-06-01 | DRG: 885 | Disposition: A | Discharge status: Home / Self Care | Payer: 59 | Source: Intra-hospital | Attending: Psychiatry | Admitting: Psychiatry

## 2021-05-16 ENCOUNTER — Other Ambulatory Visit: Payer: Self-pay | Admitting: Registered Nurse

## 2021-05-16 DIAGNOSIS — E119 Type 2 diabetes mellitus without complications: Secondary | ICD-10-CM | POA: Diagnosis present

## 2021-05-16 DIAGNOSIS — F419 Anxiety disorder, unspecified: Secondary | ICD-10-CM | POA: Diagnosis present

## 2021-05-16 DIAGNOSIS — R45851 Suicidal ideations: Secondary | ICD-10-CM | POA: Diagnosis present

## 2021-05-16 DIAGNOSIS — F1220 Cannabis dependence, uncomplicated: Secondary | ICD-10-CM | POA: Diagnosis present

## 2021-05-16 DIAGNOSIS — Z7984 Long term (current) use of oral hypoglycemic drugs: Secondary | ICD-10-CM | POA: Diagnosis not present

## 2021-05-16 DIAGNOSIS — Z79899 Other long term (current) drug therapy: Secondary | ICD-10-CM | POA: Diagnosis not present

## 2021-05-16 DIAGNOSIS — E7800 Pure hypercholesterolemia, unspecified: Secondary | ICD-10-CM | POA: Diagnosis present

## 2021-05-16 DIAGNOSIS — F333 Major depressive disorder, recurrent, severe with psychotic symptoms: Secondary | ICD-10-CM | POA: Insufficient documentation

## 2021-05-16 DIAGNOSIS — F250 Schizoaffective disorder, bipolar type: Principal | ICD-10-CM | POA: Diagnosis present

## 2021-05-16 DIAGNOSIS — Z87891 Personal history of nicotine dependence: Secondary | ICD-10-CM

## 2021-05-16 DIAGNOSIS — G4700 Insomnia, unspecified: Secondary | ICD-10-CM | POA: Diagnosis present

## 2021-05-16 DIAGNOSIS — F319 Bipolar disorder, unspecified: Secondary | ICD-10-CM | POA: Diagnosis present

## 2021-05-16 DIAGNOSIS — Z794 Long term (current) use of insulin: Secondary | ICD-10-CM | POA: Diagnosis not present

## 2021-05-16 DIAGNOSIS — I10 Essential (primary) hypertension: Secondary | ICD-10-CM | POA: Diagnosis present

## 2021-05-16 DIAGNOSIS — Z23 Encounter for immunization: Secondary | ICD-10-CM | POA: Diagnosis not present

## 2021-05-16 DIAGNOSIS — F309 Manic episode, unspecified: Secondary | ICD-10-CM | POA: Diagnosis not present

## 2021-05-16 LAB — CBG MONITORING, ED
Glucose-Capillary: 253 mg/dL — ABNORMAL HIGH (ref 70–99)
Glucose-Capillary: 277 mg/dL — ABNORMAL HIGH (ref 70–99)

## 2021-05-16 LAB — GLUCOSE, CAPILLARY: Glucose-Capillary: 291 mg/dL — ABNORMAL HIGH (ref 70–99)

## 2021-05-16 LAB — RESP PANEL BY RT-PCR (FLU A&B, COVID) ARPGX2
Influenza A by PCR: NEGATIVE
Influenza B by PCR: NEGATIVE
SARS Coronavirus 2 by RT PCR: NEGATIVE

## 2021-05-16 MED ORDER — DIVALPROEX SODIUM 250 MG PO DR TAB
250.0000 mg | DELAYED_RELEASE_TABLET | Freq: Two times a day (BID) | ORAL | Status: DC
  Administered 2021-05-16 – 2021-05-19 (×6): 250 mg via ORAL
  Filled 2021-05-16 (×11): qty 1

## 2021-05-16 MED ORDER — TRAZODONE HCL 50 MG PO TABS
50.0000 mg | ORAL_TABLET | Freq: Every evening | ORAL | Status: DC | PRN
  Administered 2021-05-17 – 2021-05-31 (×11): 50 mg via ORAL
  Filled 2021-05-16 (×12): qty 1

## 2021-05-16 MED ORDER — ATORVASTATIN CALCIUM 80 MG PO TABS
80.0000 mg | ORAL_TABLET | Freq: Every day | ORAL | Status: DC
  Administered 2021-05-17 – 2021-06-01 (×16): 80 mg via ORAL
  Filled 2021-05-16 (×6): qty 1
  Filled 2021-05-16: qty 2
  Filled 2021-05-16 (×11): qty 1

## 2021-05-16 MED ORDER — LISINOPRIL 5 MG PO TABS
5.0000 mg | ORAL_TABLET | Freq: Every day | ORAL | Status: DC
  Administered 2021-05-17 – 2021-06-01 (×16): 5 mg via ORAL
  Filled 2021-05-16 (×17): qty 1

## 2021-05-16 MED ORDER — INSULIN ASPART 100 UNIT/ML IJ SOLN
0.0000 [IU] | Freq: Every day | INTRAMUSCULAR | Status: DC
Start: 2021-05-16 — End: 2021-06-01
  Administered 2021-05-16: 3 [IU] via SUBCUTANEOUS
  Administered 2021-05-31: 2 [IU] via SUBCUTANEOUS

## 2021-05-16 MED ORDER — METFORMIN HCL ER 500 MG PO TB24
1000.0000 mg | ORAL_TABLET | Freq: Two times a day (BID) | ORAL | Status: DC
  Administered 2021-05-16: 1000 mg via ORAL
  Filled 2021-05-16: qty 2

## 2021-05-16 MED ORDER — ARIPIPRAZOLE 10 MG PO TABS
10.0000 mg | ORAL_TABLET | Freq: Every day | ORAL | Status: DC
  Administered 2021-05-17: 10 mg via ORAL
  Filled 2021-05-16 (×3): qty 1

## 2021-05-16 MED ORDER — RISPERIDONE 0.5 MG PO TBDP
0.5000 mg | ORAL_TABLET | Freq: Two times a day (BID) | ORAL | Status: DC
  Administered 2021-05-17: 0.5 mg via ORAL
  Filled 2021-05-16 (×5): qty 1

## 2021-05-16 MED ORDER — METFORMIN HCL 500 MG PO TABS
1000.0000 mg | ORAL_TABLET | Freq: Two times a day (BID) | ORAL | Status: DC
  Administered 2021-05-17 – 2021-06-01 (×31): 1000 mg via ORAL
  Filled 2021-05-16 (×34): qty 2

## 2021-05-16 MED ORDER — HYDROXYZINE HCL 25 MG PO TABS
25.0000 mg | ORAL_TABLET | Freq: Three times a day (TID) | ORAL | Status: DC | PRN
  Administered 2021-05-17 – 2021-05-27 (×11): 25 mg via ORAL
  Filled 2021-05-16 (×11): qty 1

## 2021-05-16 MED ORDER — INSULIN GLARGINE-YFGN 100 UNIT/ML ~~LOC~~ SOLN
16.0000 [IU] | Freq: Every day | SUBCUTANEOUS | Status: DC
  Administered 2021-05-16: 16 [IU] via SUBCUTANEOUS
  Filled 2021-05-16: qty 0.16

## 2021-05-16 MED ORDER — INSULIN ASPART 100 UNIT/ML IJ SOLN
0.0000 [IU] | Freq: Three times a day (TID) | INTRAMUSCULAR | Status: DC
Start: 2021-05-17 — End: 2021-06-01
  Administered 2021-05-17: 3 [IU] via SUBCUTANEOUS
  Administered 2021-05-17 – 2021-05-18 (×3): 2 [IU] via SUBCUTANEOUS
  Administered 2021-05-18: 3 [IU] via SUBCUTANEOUS
  Administered 2021-05-19: 12:00:00 5 [IU] via SUBCUTANEOUS
  Administered 2021-05-19: 17:00:00 3 [IU] via SUBCUTANEOUS
  Administered 2021-05-19 – 2021-05-20 (×3): 1 [IU] via SUBCUTANEOUS
  Administered 2021-05-20: 2 [IU] via SUBCUTANEOUS
  Administered 2021-05-21 (×2): 1 [IU] via SUBCUTANEOUS
  Administered 2021-05-21 – 2021-05-22 (×4): 2 [IU] via SUBCUTANEOUS
  Administered 2021-05-23: 1 [IU] via SUBCUTANEOUS
  Administered 2021-05-23 – 2021-05-24 (×3): 2 [IU] via SUBCUTANEOUS
  Administered 2021-05-24 – 2021-05-25 (×3): 1 [IU] via SUBCUTANEOUS
  Administered 2021-05-25: 2 [IU] via SUBCUTANEOUS
  Administered 2021-05-26 (×2): 1 [IU] via SUBCUTANEOUS
  Administered 2021-05-26: 3 [IU] via SUBCUTANEOUS
  Administered 2021-05-27 (×2): 1 [IU] via SUBCUTANEOUS
  Administered 2021-05-27: 2 [IU] via SUBCUTANEOUS
  Administered 2021-05-28: 1 [IU] via SUBCUTANEOUS
  Administered 2021-05-28 – 2021-05-29 (×4): 2 [IU] via SUBCUTANEOUS
  Administered 2021-05-29: 17:00:00 3 [IU] via SUBCUTANEOUS
  Administered 2021-05-30 – 2021-05-31 (×4): 2 [IU] via SUBCUTANEOUS
  Administered 2021-05-31: 1 [IU] via SUBCUTANEOUS
  Administered 2021-05-31: 2 [IU] via SUBCUTANEOUS
  Administered 2021-06-01: 3 [IU] via SUBCUTANEOUS

## 2021-05-16 NOTE — ED Notes (Signed)
Attempted report to [facility] x2. Per [facility] staff, report nurse comes in at 1500 and will call this RN at number provided.

## 2021-05-16 NOTE — ED Notes (Signed)
Pt sitting in hallway coloring at this time. Denies complaints.

## 2021-05-16 NOTE — ED Notes (Addendum)
Report given to [place], Therapist, sports at [facility]. [employer] transporting pt to [facility] at this time. Belongings and paperwork sent with [employer].

## 2021-05-16 NOTE — ED Notes (Signed)
[employer] called to transport patient to [facility]

## 2021-05-16 NOTE — ED Notes (Signed)
Attempted report to [facility]. Per staff, admissions RN is currently doing an admission and is unable to take report at this time.

## 2021-05-16 NOTE — Progress Notes (Signed)
Inpatient Diabetes Program Recommendations  AACE/ADA: New Consensus Statement on Inpatient Glycemic Control (2015)  Target Ranges:  Prepandial:   less than 140 mg/dL      Peak postprandial:   less than 180 mg/dL (1-2 hours)      Critically ill patients:  140 - 180 mg/dL   Lab Results  Component Value Date   GLUCAP 277 (H) 05/16/2021   HGBA1C 9.2 (H) 05/12/2021    Review of Glycemic Control Results for [name], [name] (MRN [id number]) as of 05/16/2021 12:18  Ref. Range 05/15/2021 15:03 05/15/2021 21:08 05/15/2021 23:38 05/16/2021 08:18 05/16/2021 11:39  Glucose-Capillary Latest Ref Range: 70 - 99 mg/dL 316 (H) 262 (H) 227 (H) 253 (H) 277 (H)    Diabetes history: Type 2 DM Outpatient Diabetes medications: Metformin 1000 mg BID, Victoza 1.8 mg QD Current orders for Inpatient glycemic control: Novolog 0-15 units TID & HS   Inpatient Diabetes Program Recommendations:     Consider: - adding Semglee 16 units QD -Changing diet to carb modified -Metformin 1000 mg BID  Secure chat sent to RN to help assist.  Thanks, Bronson Curb, MSN, RNC-OB Diabetes Coordinator [phone] (8a-5p)

## 2021-05-16 NOTE — ED Notes (Signed)
IVC papers faxed to [facility] per request by NS.

## 2021-05-16 NOTE — Progress Notes (Signed)
Pt admitted involuntarily to [facility] adult inpatient unit.  Pt only expressed "I am so manic I don't know what to do"  Pt stated all she want to do is to move to [place] to start a new life.  Pt denied SI, HI.  Pt stated she sometimes have flash backs and is unable to determine if it is hallucinations.  Fifteen minute checks initiated for patient safety.  Pt safe on unit.

## 2021-05-16 NOTE — Progress Notes (Signed)
Pt accepted to [facility] 307-1    Patient meets inpatient criteria per Sheran Fava, FNP  The attending provider will be Janine Limbo, MD  Call report to [phone]    Sheldon Silvan, RN @ [facility] ED notified.     Pt scheduled  to arrive at [facility] by 1630.    Mariea Clonts, MSW, LCSW-A  2:55 PM 05/16/2021

## 2021-05-16 NOTE — Tx Team (Signed)
Initial Treatment Plan 05/16/2021 6:43 PM Valentina Shaggy [id number]    PATIENT STRESSORS: Other: unable to verbalize     PATIENT STRENGTHS: Capable of independent living  Communication skills  Motivation for treatment/growth  Supportive family/friends  Work skills    PATIENT IDENTIFIED PROBLEMS: depression  anxiety                   DISCHARGE CRITERIA:  Improved stabilization in mood, thinking, and/or behavior Need for constant or close observation no longer present Verbal commitment to aftercare and medication compliance  PRELIMINARY DISCHARGE PLAN: Outpatient therapy Return to previous living arrangement Return to previous work or school arrangements  PATIENT/FAMILY INVOLVEMENT: This treatment plan has been presented to and reviewed with the patient, [name], and/or family member.  The patient and family have been given the opportunity to ask questions and make suggestions.  Judie Petit, RN 05/16/2021, 6:43 PM

## 2021-05-16 NOTE — ED Notes (Signed)
Attempted report to [facility] x1.

## 2021-05-16 NOTE — ED Notes (Signed)
[facility] faxed pregnancy test and nursing notes for admission    Fax number [phone]

## 2021-05-16 NOTE — BH Assessment (Signed)
RE-ASSESSMENT   This Probation officer met with patient this date to assess current mental health status. Patient initially presented on 11/10 with S/I, depression along with psychotic features,. Patient is a poor historian and speaks at length in reference to topics not associated with assessment questions. She is alert yet disoriented, confused and renders a conflicting history. Patient's memory is impaired and is having problems recalling why she presented to the ED. When asked in reference to S/I patient states, "I have to think about that." He speech is pressured at times and she appears to be having problems with recent recall. Patient contnues to display active flight of ideas. Per notes patient was observed earlier possible responding to internal stimuli as she sat alone. [place] NP recommends a continued inpatient admission as bed placement is investigated.

## 2021-05-16 NOTE — ED Provider Notes (Signed)
Emergency Medicine Observation Re-evaluation Note  [name] is a 45 y.o. female, seen on rounds today at 0700.  Pt initially presented to the ED for complaints of Mental Health Problem (//-*) and Psychiatric Evaluation Currently, the patient is resting comfortably.  Physical Exam  BP (!) 106/51   Pulse (!) 104   Temp 98.3 F (36.8 C)   Resp 17   SpO2 100%  Physical Exam General: NAD, asleep in hallway bed   ED Course / MDM  EKG:EKG Interpretation  Date/Time:  Thursday May 12 2021 08:38:25 EST Ventricular Rate:  103 PR Interval:  141 QRS Duration: 91 QT Interval:  350 QTC Calculation: 459 R Axis:   0 Text Interpretation: Sinus tachycardia Confirmed by Lennice Sites ([id number]) on 05/12/2021 1:28:39 PM  I have reviewed the labs performed to date as well as medications administered while in observation.  Recent changes in the last 24 hours include no significant acute events reported.  Plan  Current plan is for continuing psych evaluation and placment. [name] is under involuntary commitment.      Valarie Merino, MD 05/16/21 [phone]

## 2021-05-16 NOTE — ED Notes (Signed)
Pt approached desk stating she wanted to make a phone call and was requesting toothbrush and toothpaste. Provided toothbrush and toothpaste, pt making a phone call at this time. Per night shift, pt has made one phone call today. Pt made aware of phone policy.

## 2021-05-16 NOTE — [facility] Group Notes (Signed)
[facility] Group Notes:  (Nursing/MT/Case Management/Adjunct)  Date:  05/16/2021  Time:  9:42 PM  Type of Therapy:  Psychoeducational Skills  Participation Level:  Active  Participation Quality:  Appropriate  Affect:  Appropriate  Cognitive:  Appropriate  Insight:  Good  Engagement in Group:  Engaged  Modes of Intervention:  Education  Summary of Progress/Problems:The patient attended the evening A.A. meeting.   Archie Balboa S 05/16/2021, 9:42 PM

## 2021-05-16 NOTE — ED Notes (Signed)
Pt states "I need to walk around" when ask where she would like to walk pt stated "to the front, someone is waiting on me" pt educated that she is not allowed to leave but if she wanted to walk she could only walk within a very short distance

## 2021-05-17 DIAGNOSIS — F250 Schizoaffective disorder, bipolar type: Principal | ICD-10-CM

## 2021-05-17 LAB — LIPID PANEL
Cholesterol: 101 mg/dL (ref 0–200)
HDL: 32 mg/dL — ABNORMAL LOW (ref >40)
LDL Cholesterol: 56 mg/dL (ref 0–99)
Total CHOL/HDL Ratio: 3.2 RATIO
Triglycerides: 65 mg/dL (ref <150)
VLDL: 13 mg/dL (ref 0–40)

## 2021-05-17 LAB — GLUCOSE, CAPILLARY
Glucose-Capillary: 184 mg/dL — ABNORMAL HIGH (ref 70–99)
Glucose-Capillary: 178 mg/dL — ABNORMAL HIGH (ref 70–99)
Glucose-Capillary: 208 mg/dL — ABNORMAL HIGH (ref 70–99)
Glucose-Capillary: 200 mg/dL — ABNORMAL HIGH (ref 70–99)

## 2021-05-17 LAB — RAPID URINE DRUG SCREEN, HOSP PERFORMED
Amphetamines: NOT DETECTED
Barbiturates: NOT DETECTED
Benzodiazepines: NOT DETECTED
Cocaine: NOT DETECTED
Opiates: NOT DETECTED
Tetrahydrocannabinol: POSITIVE — AB

## 2021-05-17 LAB — VALPROIC ACID LEVEL: Valproic Acid Lvl: 45 ug/mL — ABNORMAL LOW (ref 50.0–100.0)

## 2021-05-17 LAB — TSH: TSH: 0.198 u[IU]/mL — ABNORMAL LOW (ref 0.350–4.500)

## 2021-05-17 MED ORDER — INSULIN GLARGINE-YFGN 100 UNIT/ML ~~LOC~~ SOLN
16.0000 [IU] | Freq: Every day | SUBCUTANEOUS | Status: DC
  Administered 2021-05-18 – 2021-06-01 (×15): 16 [IU] via SUBCUTANEOUS

## 2021-05-17 MED ORDER — ARIPIPRAZOLE 15 MG PO TABS
15.0000 mg | ORAL_TABLET | Freq: Every day | ORAL | Status: DC
  Administered 2021-05-18 – 2021-05-20 (×3): 15 mg via ORAL
  Filled 2021-05-17 (×5): qty 1

## 2021-05-17 NOTE — Progress Notes (Signed)
Pt has been observed interacting well in the milieu with peers. Pt offered food and drink as she said she had not eaten. Pt attended group and participated, denied SI/HI and contracted for safety. Maintained on q 15 min checks, support and encouragement offered as needed, medication provided as ordered, will continue to monitor.

## 2021-05-17 NOTE — H&P (Addendum)
Psychiatric Admission Assessment Adult  Patient Identification: [name] MRN:  [id number] Date of Evaluation:  05/17/2021 Chief Complaint:  MDD (major depressive disorder), recurrent, severe, with psychosis ([place]) [F33.3] Principal Diagnosis: Schizoaffective disorder, bipolar type ([place]) Diagnosis:  Principal Problem:   Schizoaffective disorder, bipolar type ([place]) Active Problems:   Cannabis use disorder, moderate, dependence ([place])  History of Present Illness:   Patient is a 45 year old female with a reported psychiatric history of "schizoaffective, bipolar, and personality" who was admitted to this psychiatric unit for disorientation, confusion, hallucinations, suicidal thoughts, and delusions.  Patient was medically cleared by outside hospital prior to admission to the psychiatric unit.  Patient case was discussed during interdisciplinary team meeting this morning.  During the initial intake evaluation today, the patient was disorganized and tangential.  She contradicts herself multiple times, during the interview, about details of her HPI.  She reports that she was driving from [place], to [place], realized that she was not thinking clearly and confused, and went to the [facility] for evaluation.  She reports that she was referred for psychiatric evaluation and then admitted to this hospital.  The patient reports feeling disoriented.  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  She reports having auditory hallucinations, off and on, and also hearing voices through walls at her home, "the torment me".  She denies having command auditory hallucinations.  Denies any visual hallucinations.  She reports that her emotions are up and down, recently mourning, but does not specify what she was mourning.  She denies pervasive sadness or anhedonia.  She reports that her sleep is mostly okay, sleep about 10 hours per night, and intervals, as she  likes to wake up around 3 AM to watch her favorite news show, but return to sleep for multiple hours.  Denies changes in appetite.  Reports having passive suicidal thoughts, without intent or plan.  Denies having homicidal ideation.  Reports that anxiety is elevated.  When assessed for symptoms meeting criteria for mania or hypomania, the patient cannot reliably answer this assessment due to tangentiality and disorganization of thoughts.  There is previous diagnosis of schizoaffective disorder bipolar type and also bipolar disorder.  Past theatric history: Patient reports history of "schizoaffective disorder, bipolar, personality" Patient reports most recently taking Abilify, Ativan, and Depakote.  Per a controlled substance report, Ativan was last filled in May 2022.  Patient does not give consistent answer as to her medication adherence she reports "I think I was taking them but I was moving out of my house".  Patient reports a history of taking Geodon, Paxil, and trazodone.  Patient reports history of multiple psychiatric hospitalizations.  She denies history of suicide attempts but does report having put a butter knife to her wrist 1 time "to scare someone off".  Past medical history: Reports she has diabetes, does not use as well home.  Hypertension.  Hyperlipidemia.  NKDA.  History of cholecystectomy.  Patient denies having had a seizure in the past but then later reports that she has had a seizure in the past.  She denies having been diagnosed with epilepsy in the past or taking antiseizure medications.  She reports taking my Depakote for mood stability..  Family history: Patient reports having cousins with psychiatric illness but does not know what illnesses are specifically.  Denies family history of suicide attempts.  Social history: Patient reports being born in [place] and raised "somewhere else".  She reports she is  single.  Has 2 children.  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  Substance use history: Patient reports "I might have used something from a bait shop during homecoming" when asked if she is illicit drugs or marijuana.  Reports babying nicotine on and off.  Denies alcohol use.  Total Time spent with patient: 45 minutes  Is the patient at risk to self? Yes.    Has the patient been a risk to self in the past 6 months? No.  Has the patient been a risk to self within the distant past? Yes.    Is the patient a risk to others? No.  Has the patient been a risk to others in the past 6 months? No.  Has the patient been a risk to others within the distant past? No.   Prior Inpatient Therapy:   Prior Outpatient Therapy:    Alcohol Screening: 1. How often do you have a drink containing alcohol?: Monthly or less 2. How many drinks containing alcohol do you have on a typical day when you are drinking?: 1 or 2 3. How often do you have six or more drinks on one occasion?: Never AUDIT-C Score: 1 4. How often during the last year have you found that you were not able to stop drinking once you had started?: Never 5. How often during the last year have you failed to do what was normally expected from you because of drinking?: Never 6. How often during the last year have you needed a first drink in the morning to get yourself going after a heavy drinking session?: Never 7. How often during the last year have you had a feeling of guilt of remorse after drinking?: Never 8. How often during the last year have you been unable to remember what happened the night before because you had been drinking?: Never 9. Have you or someone else been injured as a result of your drinking?: No 10. Has a relative or friend or a doctor or another health worker been concerned about your drinking or suggested you cut down?: No Alcohol Use Disorder Identification Test Final Score (AUDIT): 1 Substance Abuse History in the last 12 months: Patient is unsure but reports she might have  smoked something during homecoming, a few weeks ago Consequences of Substance Abuse: Negative Previous Psychotropic Medications: Yes  Psychological Evaluations: Yes  Past Medical History:  Past Medical History:  Diagnosis Date   Abnormal Pap smear    Bipolar 1 disorder ([place])    Diabetes mellitus without complication (HCC)    Fibroids    Hypertension    IBS (irritable bowel syndrome)     Past Surgical History:  Procedure Laterality Date   CERVICAL BIOPSY     CHOLECYSTECTOMY     ESSURE TUBAL LIGATION     HIATAL HERNIA REPAIR     TUBAL LIGATION     Family History:  Family History  Problem Relation Age of Onset   Diabetes Father    Diabetes Paternal Grandmother    Diabetes Maternal Grandmother    Mental illness Cousin    Healthy Mother    Family Psychiatric  History: See above Tobacco Screening:   Social History:  Social History   Substance and Sexual Activity  Alcohol Use Not Currently   Comment: occasional     Social History   Substance and Sexual Activity  Drug Use Not Currently    Additional Social History:  Allergies:  Not on File Lab Results:  Results for orders placed or performed during the hospital encounter of 05/16/21 (from the past 48 hour(s))  Glucose, capillary     Status: Abnormal   Collection Time: 05/16/21  9:42 PM  Result Value Ref Range   Glucose-Capillary 291 (H) 70 - 99 mg/dL    Comment: Glucose reference range applies only to samples taken after fasting for at least 8 hours.  Rapid urine drug screen (hospital performed)     Status: Abnormal   Collection Time: 05/17/21  6:17 AM  Result Value Ref Range   Opiates NONE DETECTED NONE DETECTED   Cocaine NONE DETECTED NONE DETECTED   Benzodiazepines NONE DETECTED NONE DETECTED   Amphetamines NONE DETECTED NONE DETECTED   Tetrahydrocannabinol POSITIVE (A) NONE DETECTED   Barbiturates NONE DETECTED NONE DETECTED    Comment: (NOTE) DRUG SCREEN FOR MEDICAL  PURPOSES ONLY.  IF CONFIRMATION IS NEEDED FOR ANY PURPOSE, NOTIFY LAB WITHIN 5 DAYS.  LOWEST DETECTABLE LIMITS FOR URINE DRUG SCREEN Drug Class                     Cutoff (ng/mL) Amphetamine and metabolites    1000 Barbiturate and metabolites    200 Benzodiazepine                 382 Tricyclics and metabolites     300 Opiates and metabolites        300 Cocaine and metabolites        300 THC                            50 Performed at [facility], [place] [address] [place], [place] [address]   Glucose, capillary     Status: Abnormal   Collection Time: 05/17/21  6:17 AM  Result Value Ref Range   Glucose-Capillary 184 (H) 70 - 99 mg/dL    Comment: Glucose reference range applies only to samples taken after fasting for at least 8 hours.   Comment 1 Notify RN    Comment 2 Document in Chart   Lipid panel     Status: Abnormal   Collection Time: 05/17/21  6:24 AM  Result Value Ref Range   Cholesterol 101 0 - 200 mg/dL   Triglycerides 65 <150 mg/dL   HDL 32 (L) >40 mg/dL   Total CHOL/HDL Ratio 3.2 RATIO   VLDL 13 0 - 40 mg/dL   LDL Cholesterol 56 0 - 99 mg/dL    Comment:        Total Cholesterol/HDL:CHD Risk Coronary Heart Disease Risk Table                     Men   Women  1/2 Average Risk   3.4   3.3  Average Risk       5.0   4.4  2 X Average Risk   9.6   7.1  3 X Average Risk  23.4   11.0        Use the calculated Patient Ratio above and the CHD Risk Table to determine the patient's CHD Risk.        ATP III CLASSIFICATION (LDL):  <100     mg/dL   Optimal  100-129  mg/dL   Near or Above                    Optimal  130-159  mg/dL   Borderline  160-189  mg/dL   High  >190     mg/dL   Very High Performed at [facility] [address] [place], [place] [address]   TSH     Status: Abnormal   Collection Time: 05/17/21  6:24 AM  Result Value Ref Range   TSH 0.198 (L) 0.350 - 4.500 uIU/mL    Comment: Performed by a 3rd Generation  assay with a functional sensitivity of <=0.01 uIU/mL. Performed at [facility], [place] [address], [place], [place] [address]   Valproic acid level     Status: Abnormal   Collection Time: 05/17/21  6:24 AM  Result Value Ref Range   Valproic Acid Lvl 45 (L) 50.0 - 100.0 ug/mL    Comment: Performed at [facility], [place] [address] [place], [place] [address]  Glucose, capillary     Status: Abnormal   Collection Time: 05/17/21 11:50 AM  Result Value Ref Range   Glucose-Capillary 178 (H) 70 - 99 mg/dL    Comment: Glucose reference range applies only to samples taken after fasting for at least 8 hours.    Blood Alcohol level:  Lab Results  Component Value Date   ETH <10 05/12/2021   ETH <10 79/89/2119    Metabolic Disorder Labs:  Lab Results  Component Value Date   HGBA1C 9.2 (H) 05/12/2021   MPG 217.34 05/12/2021   MPG 209 08/19/2020   Lab Results  Component Value Date   PROLACTIN 11.7 05/17/2018   PROLACTIN 105.5 (H) 02/28/2017   Lab Results  Component Value Date   CHOL 101 05/17/2021   TRIG 65 05/17/2021   HDL 32 (L) 05/17/2021   CHOLHDL 3.2 05/17/2021   VLDL 13 05/17/2021   LDLCALC 56 05/17/2021   LDLCALC 49 08/18/2020    Current Medications: Current Facility-Administered Medications  Medication Dose Route Frequency Provider Last Rate Last Admin   ARIPiprazole (ABILIFY) tablet 10 mg  10 mg Oral Daily Rankin, Shuvon B, NP   10 mg at 05/17/21 0900   atorvastatin (LIPITOR) tablet 80 mg  80 mg Oral Daily Rankin, Shuvon B, NP   80 mg at 05/17/21 0900   divalproex (DEPAKOTE) DR tablet 250 mg  250 mg Oral BID Rankin, Shuvon B, NP   250 mg at 05/17/21 0900   hydrOXYzine (ATARAX/VISTARIL) tablet 25 mg  25 mg Oral TID PRN Pres[place]illa Sours, PA-C   25 mg at 05/17/21 0140   insulin aspart (novoLOG) injection 0-5 Units  0-5 Units Subcutaneous QHS Prescilla Sours, PA-C   3 Units at 05/16/21 2254   insulin aspart (novoLOG) injection 0-9 Units  0-9  Units Subcutaneous TID WC Rankin, Shuvon B, NP   2 Units at 05/17/21 1211   lisinopril (ZESTRIL) tablet 5 mg  5 mg Oral Daily Rankin, Shuvon B, NP   5 mg at 05/17/21 0900   metFORMIN (GLUCOPHAGE) tablet 1,000 mg  1,000 mg Oral BID WC Rankin, Shuvon B, NP   1,000 mg at 05/17/21 0900   risperiDONE (RISPERDAL M-TABS) disintegrating tablet 0.5 mg  0.5 mg Oral BID Margorie John W, PA-C   0.5 mg at 05/17/21 0900   traZODone (DESYREL) tablet 50 mg  50 mg Oral QHS PRN Margorie John W, PA-C   50 mg at 05/17/21 0140   PTA Medications: Medications Prior to Admission  Medication Sig Dispense Refill Last Dose   Accu-Chek Softclix Lancets lancets Use to check FSBS BID. Dx: E11.42 100  each 2    ARIPiprazole (ABILIFY) 10 MG tablet Take 1 tablet (10 mg total) by mouth daily. 30 tablet 0    atorvastatin (LIPITOR) 80 MG tablet Take 1 tablet (80 mg total) by mouth daily. 90 tablet 0    Blood Glucose Monitoring Suppl (ACCU-CHEK GUIDE ME) w/Device KIT Use to check FSBS BID. Dx: E11.42 1 kit 0    Blood Glucose Monitoring Suppl (TRUE METRIX METER) w/Device KIT Use as directed 1 kit 0    dapagliflozin propanediol (FARXIGA) 10 MG TABS tablet Take 1 tablet (10 mg total) by mouth daily before breakfast. 90 tablet 0    divalproex (DEPAKOTE) 250 MG DR tablet Take 1 tablet (250 mg total) by mouth 2 (two) times daily. 60 tablet 0    glucose blood (TRUE METRIX BLOOD GLUCOSE TEST) test strip Use as instructed 100 each 12    Insulin Pen Needle (TRUEPLUS PEN NEEDLES) 32G X 4 MM MISC Use as instructed to inject Victoza daily. 100 each 0    liraglutide (VICTOZA) 18 MG/3ML SOPN INJECT 1.8 MG UNDER THE SKIN EVERY DAY (Patient taking differently: Inject 1.8 mg into the skin daily.) 9 mL 0    lisinopril (ZESTRIL) 5 MG tablet Take 1 tablet (5 mg total) by mouth daily. 90 tablet 0    LORazepam (ATIVAN) 1 MG tablet Take 1 tablet (1 mg total) by mouth 3 (three) times daily. 90 tablet 0    metFORMIN (GLUCOPHAGE) 1000 MG tablet Take 1 tablet  (1,000 mg total) by mouth 2 (two) times daily with a meal. 180 tablet 0    neomycin-polymyxin-hydrocortisone (CORTISPORIN) 3.5-10000-1 OTIC suspension Place 4 drops into the right ear 3 (three) times daily. (Patient not taking: No sig reported) 10 mL 0    nitrofurantoin, macrocrystal-monohydrate, (MACROBID) 100 MG capsule Take 1 capsule (100 mg total) by mouth 2 (two) times daily. (Patient taking differently: Take 100 mg by mouth See admin instructions. Bid x 5 days) 10 capsule 0    TRUEplus Lancets 28G MISC Use as directed 100 each 1     Musculoskeletal: Strength & Muscle Tone: within normal limits Gait & Station: normal Patient leans: N/A            Psychiatric Specialty Exam:  Presentation  General Appearance: Disheveled  Eye Contact:Good  Speech:Normal Rate  Speech Volume:Normal  Handedness:No data recorded  Mood and Affect  Mood:Anxious  Affect:Constricted   Thought Process  Thought Processes:Disorganized  Duration of Psychotic Symptoms: Greater than six months  Past Diagnosis of Schizophrenia or Psychoactive disorder: Yes  Descriptions of Associations:Tangential  Orientation:Partial  Thought Content:Tangential; Delusions; Illogical; Paranoid Ideation  Hallucinations:Hallucinations: Auditory  Ideas of Reference:Delusions; Paranoia  Suicidal Thoughts:Suicidal Thoughts: Yes, Passive SI Passive Intent and/or Plan: Without Intent; Without Plan  Homicidal Thoughts:Homicidal Thoughts: No   Sensorium  Memory:Immediate Poor; Recent Poor; Remote Poor  Judgment:Poor  Insight:Poor   Executive Functions  Concentration:Poor  Attention Span:Poor  Recall:Poor  Fund of Knowledge:Poor  Language:Poor   Psychomotor Activity  Psychomotor Activity:Psychomotor Activity: Normal   Assets  Assets:Housing; Financial Resources/Insurance; Desire for Improvement; Physical Health; Leisure Time   Sleep  Sleep:Sleep: Fair    Physical  Exam: Physical Exam Vitals reviewed.  Constitutional:      General: She is not in acute distress.    Appearance: She is not ill-appearing or toxic-appearing.  Pulmonary:     Effort: Pulmonary effort is normal. No respiratory distress.  Neurological:     Mental Status: She is alert.  Motor: No weakness.     Gait: Gait normal.   Review of Systems  Constitutional:  Negative for chills and fever.  Cardiovascular:  Negative for chest pain and palpitations.  Psychiatric/Behavioral:  Positive for hallucinations, substance abuse and suicidal ideas. Negative for depression. The patient is nervous/anxious.   All other systems reviewed and are negative. Blood pressure 129/87, pulse (!) 116, temperature 98.2 F (36.8 C), temperature source Oral, resp. rate 16, height '5\' 11"'  (1.803 m), weight 114.8 kg. Body mass index is 35.29 kg/m.  Treatment Plan Summary: Daily contact with patient to assess and evaluate symptoms and progress in treatment, Medication management, and Plan    ASSESSMENT:  Diagnoses / Active Problems: Schizoaffective bipolar type Rule out UTI or underlying medical cause of exacerbation of psychiatric illness  PLAN: Safety and Monitoring:  -- Involuntary admission to inpatient psychiatric unit for safety, stabilization and treatment  -- Daily contact with patient to assess and evaluate symptoms and progress in treatment  -- Patient's case to be discussed in multi-disciplinary team meeting  -- Observation Level : q15 minute checks  -- Vital signs:  q12 hours  -- Precautions: suicide, elopement, and assault  2. Psychiatric Diagnoses and Treatment:    Increase Abilify to 15 mg once daily Stop Risperdal 0.5 mg twice daily Continue Depakote 500 mg once daily Stop Ativan Continue other medications as ordered Appreciate recommendations from diabetes coordinator  --  The risks/benefits/side-effects/alternatives to this medication were discussed in detail with the patient  and time was given for questions. The patient consents to medication trial.    -- Metabolic profile and EKG monitoring obtained while on an atypical antipsychotic (BMI: Lipid Panel: HbgA1c: QTc:) 459  -- Encouraged patient to participate in unit milieu and in scheduled group therapies   -- Short Term Goals: Ability to identify changes in lifestyle to reduce recurrence of condition will improve, Ability to verbalize feelings will improve, Ability to disclose and discuss suicidal ideas, Ability to demonstrate self-control will improve, Ability to identify and develop effective coping behaviors will improve, Ability to maintain clinical measurements within normal limits will improve, Compliance with prescribed medications will improve, and Ability to identify triggers associated with substance abuse/mental health issues will improve  -- Long Term Goals: Improvement in symptoms so as ready for discharge    3. Medical Issues Being Addressed:   Tobacco Use Disorder  -- Nicotine patch 40m/24 hours was offered but the patient declined  -- Smoking cessation encouraged  Low TSH.  We will order free T4, T3, free T3.  Repeat UA  4. Discharge Planning:   -- Social work and case management to assist with discharge planning and identification of hospital follow-up needs prior to discharge  -- Estimated LOS: 5-7 days  -- Discharge Concerns: Need to establish a safety plan; Medication compliance and effectiveness  -- Discharge Goals: Return home with outpatient referrals for mental health follow-up including medication management/psychotherapy     Observation Level/Precautions:  15 minute checks  Laboratory:   See above  Psychotherapy: Group therapy and supportive psychotherapy  Medications: As above  Consultations:    Discharge Concerns:    Estimated LOS: 5 to 7 days  Other:     Physician Treatment Plan for Primary Diagnosis: Schizoaffective disorder, bipolar type ([facility]  Physician Treatment  Plan for Secondary Diagnosis: Principal Problem:   Schizoaffective disorder, bipolar type ([place] Active Problems:   Cannabis use disorder, moderate, dependence ([place]   I certify that inpatient services furnished can reasonably be expected  to improve the patient's condition.    Total Time Spent in Direct Patient Care:  I personally spent 60 minutes on the unit in direct patient care. The direct patient care time included face-to-face time with the patient, reviewing the patient's chart, communicating with other professionals, and coordinating care. Greater than 50% of this time was spent in counseling or coordinating care with the patient regarding goals of hospitalization, psycho-education, and discharge planning needs.    Christoper Allegra, MD 11/15/20222:09 PM
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
## 2021-05-17 NOTE — BHH Group Notes (Signed)
The focus of this group is to help patients establish daily goals to achieve during treatment and discuss how the patient can incorporate goal setting into their daily lives to aide in recovery.  Pt did not attend morning goals group.

## 2021-05-17 NOTE — Progress Notes (Signed)
D:  Patient's self inventory sheet, patient has fair sleep, sleep medication received.  Good appetite, low energy level, poor concentration.  Rated depression 3, hopeless 5, anxiety 10.  Withdrawals, diarrhea, cravings.  Denied SI.  Physical problems, blurred vision, very groggy.  Denied physical pain.  Goal is meet with SW/MD.  Plans to reach out to more people.  "I think someone is following me around going where I go (short story)." A:  Medications administered per MD orders.  Emotional support and encouragement given patient. R:  Denied SI and HI.  Contracts for safety.  Also denied A/V hallucinations.  Safety maintained with 15 minute checks.

## 2021-05-17 NOTE — Progress Notes (Signed)
Pt paranoid, had to be moved to [address] hall and made no roommate. Pt confused at times , visible on the unit this evening.    05/17/21 2000  Psych Admission Type (Psych Patients Only)  Admission Status Involuntary  Psychosocial Assessment  Patient Complaints Anxiety;Depression  Eye Contact Staring  Facial Expression Wide-eyed  Affect Anxious  Speech Logical/coherent  Interaction Assertive  Motor Activity Slow  Appearance/Hygiene In scrubs  Behavior Characteristics Anxious  Mood Depressed;Anxious  Thought Process  Coherency WDL  Content Preoccupation  Delusions Paranoid  Perception WDL  Hallucination None reported or observed  Judgment Limited  Confusion None  Danger to Self  Current suicidal ideation? Denies  Danger to Others  Danger to Others None reported or observed

## 2021-05-17 NOTE — Plan of Care (Signed)
Nurse discussed coping skills with patient.  

## 2021-05-17 NOTE — Progress Notes (Signed)
Patient did not attend group.

## 2021-05-18 ENCOUNTER — Encounter (HOSPITAL_COMMUNITY): Payer: Self-pay

## 2021-05-18 DIAGNOSIS — F1220 Cannabis dependence, uncomplicated: Secondary | ICD-10-CM

## 2021-05-18 LAB — URINALYSIS, COMPLETE (UACMP) WITH MICROSCOPIC
Bacteria, UA: NONE SEEN
Bilirubin Urine: NEGATIVE
Glucose, UA: NEGATIVE mg/dL
Hgb urine dipstick: NEGATIVE
Ketones, ur: 5 mg/dL — AB
Leukocytes,Ua: NEGATIVE
Nitrite: NEGATIVE
Protein, ur: NEGATIVE mg/dL
Specific Gravity, Urine: 1.015 (ref 1.005–1.030)
pH: 5 (ref 5.0–8.0)

## 2021-05-18 LAB — GLUCOSE, CAPILLARY
Glucose-Capillary: 164 mg/dL — ABNORMAL HIGH (ref 70–99)
Glucose-Capillary: 163 mg/dL — ABNORMAL HIGH (ref 70–99)
Glucose-Capillary: 235 mg/dL — ABNORMAL HIGH (ref 70–99)
Glucose-Capillary: 170 mg/dL — ABNORMAL HIGH (ref 70–99)

## 2021-05-18 MED ORDER — HALOPERIDOL 5 MG PO TABS
5.0000 mg | ORAL_TABLET | Freq: Two times a day (BID) | ORAL | Status: DC
  Administered 2021-05-18 – 2021-05-19 (×2): 5 mg via ORAL
  Filled 2021-05-18 (×9): qty 1

## 2021-05-18 MED ORDER — OLANZAPINE 5 MG PO TABS
5.0000 mg | ORAL_TABLET | Freq: Two times a day (BID) | ORAL | Status: DC | PRN
  Administered 2021-05-18 – 2021-05-21 (×4): 5 mg via ORAL
  Filled 2021-05-18 (×4): qty 2

## 2021-05-18 MED ORDER — OLANZAPINE 10 MG IM SOLR: 5.0000 mg | Freq: Two times a day (BID) | INTRAMUSCULAR | Status: DC | PRN

## 2021-05-18 MED ORDER — BENZTROPINE MESYLATE 0.5 MG PO TABS
0.5000 mg | ORAL_TABLET | Freq: Two times a day (BID) | ORAL | Status: DC
  Administered 2021-05-18 – 2021-05-19 (×2): 0.5 mg via ORAL
  Filled 2021-05-18 (×9): qty 1

## 2021-05-18 NOTE — [facility] Group Notes (Signed)
Pt did not attend group. 

## 2021-05-18 NOTE — [facility] Group Notes (Signed)
Pt did not attend goals group. 

## 2021-05-18 NOTE — [facility] Group Notes (Signed)
Adult Psychoeducational Group Note  Date:  05/18/2021 Time:  4:32 PM  Group Topic/Focus:  Healthy Communication:   The focus of this group is to discuss communication, barriers to communication, as well as healthy ways to communicate with others.  Participation Level:  Did Not Attend   [name] 05/18/2021, 4:32 PM

## 2021-05-18 NOTE — Progress Notes (Signed)
Recreation Therapy Notes  INPATIENT RECREATION THERAPY ASSESSMENT  Patient Details Name: [name] MRN: [id number] DOB: 1976-03-27 Today's Date: 05/18/2021       Information Obtained From: Patient  Able to Participate in Assessment/Interview: Yes  Patient Presentation: Alert (Paranoid, Soft Spoken)  Reason for Admission (Per Patient): Other (Comments) ("a situation that got out of control")  Patient Stressors: Other (Comment) (Rent fell 3 to 4 months behind, city said they would pay it but only paid for a few months then stopped paying it, went into a frenzy and was stressed)  Coping Skills:   Journal, TV, Sports, Arguments, Aggression, Music, Exercise, Meditate, Deep Breathing, Substance Abuse, Impulsivity, Prayer, Talk, Art, Avoidance, Read, Dance, Hot Bath/Shower  Leisure Interests (2+):  Exercise - Walking, Social - Family, Music - Singing, Art - Draw, Art - Other (Comment), Individual - Other (Comment) (Help others; Industrial/product designer)  Frequency of Recreation/Participation: Other (Comment) (Daily)  Awareness of Community Resources:  Yes  Community Resources:  [place], Special educational needs teacher (Comment) [employer])  Current Use: Yes  If no, Barriers?:    Expressed Interest in [place]: No  [place] of Residence:  Investment banker, corporate  Patient Main Form of Transportation: Walk (Surveyor, mining, Others, Diplomatic Services operational officer)  Patient Strengths:  Help people get jobs/start businesses/get computer skills/personal development skills  Patient Identified Areas of Improvement:  "not doing things that are bad for my health"  Patient Goal for Hospitalization:  "make sure I get with a Education officer, museum soon, doctor don't change medications but if they feel it beneficial that would be fine"  Current SI (including self-harm):  No  Current HI:  No  Current AVH: No  Staff Intervention Plan: Group Attendance, Collaborate with Interdisciplinary Treatment Team  Consent to  Intern Participation: N/A    Victorino Sparrow, Vickki Muff, Harrel Ferrone A 05/18/2021, 12:45 PM

## 2021-05-18 NOTE — BHH Counselor (Signed)
[facility] LCSW Note  05/18/2021   1:15PM  Type of Contact and Topic:  PSA Attempt  CSW met with pt in attempts to complete PSA. CSW proved able to partially complete assessment due to pt's disorganized thought content, tangentiality, and paranoia.  CSW team will make efforts to finalize PSA with pt on 05/19/21.    Blane Ohara, LCSW 05/18/2021  4:14 PM

## 2021-05-18 NOTE — Progress Notes (Signed)
[facility] MD Progress Note  05/18/2021 11:31 AM [name]  MRN:  [id number]  Subjective: [name]' tearfully reports, "I'm good. I have been here for a couple of days because I have been going through a lot. People were following me, listening to me & trying to track where I was at anytime. It made feel jumpy". Daily notes: [name] is seen. Chart reviewed. The chart findings discussed with the treatment team. She presents alert, oriented & aware of what has been going on with her. However, she presents with a disorganized & delusional thinking, worsening paranoia. She thinks people were following her, listening to her & trying to track her movements & whereabout. She says these has caused her a lot of stress prior to coming to the hospital. She says she is feeling a lot better since coming to the hospital. She says she wants to feel better, become less jumpy, start feeling normal again. She says her goal is to become normal again. And when feeling normal, she can do basic things like cooking, work & travel. She says when not normal, she can become erratic & call the police just to reach out. [name] seem like she is thought blocking as well as responding to internal stimuli. She does exihib some latency of speech. We have added Haldol 5 mg bid & cogentin 0.5 mg bid to her treatment plan to help combat the symptoms. She has been transferred to the 500-hall from 400-hall. She is currently in no apparent distress.   Reason for admission: 45 year old female with a reported psychiatric history of "schizoaffective, bipolar, and personality" who was admitted to this psychiatric unit for disorientation, confusion, hallucinations, suicidal thoughts, and delusions. During the her admission interview, about details of her HPI.  She reports that she was driving from [place], to [place], realized that she was not thinking clearly and confused, and went to the [facility] at [facility] for evaluation.    Principal Problem:  Schizoaffective disorder, bipolar type ([place])  Diagnosis: Principal Problem:   Schizoaffective disorder, bipolar type ([place]) Active Problems:   Cannabis use disorder, moderate, dependence ([place])  Total Time spent with patient:  35 minutes  Past Psychiatric History: Schizoaffective disorder, bipolar-type.  Past Medical History:  Past Medical History:  Diagnosis Date   Abnormal Pap smear    Bipolar 1 disorder ([place])    Diabetes mellitus without complication (HCC)    Fibroids    Hypertension    IBS (irritable bowel syndrome)     Past Surgical History:  Procedure Laterality Date   CERVICAL BIOPSY     CHOLECYSTECTOMY     ESSURE TUBAL LIGATION     HIATAL HERNIA REPAIR     TUBAL LIGATION     Family History:  Family History  Problem Relation Age of Onset   Diabetes Father    Diabetes Paternal Grandmother    Diabetes Maternal Grandmother    Mental illness Cousin    Healthy Mother    Family Psychiatric  History: See H&P.  Social History:  Social History   Substance and Sexual Activity  Alcohol Use Not Currently   Comment: occasional     Social History   Substance and Sexual Activity  Drug Use Not Currently    Social History   Socioeconomic History   Marital status: Single    Spouse name: Not on file   Number of children: 2   Years of education: Not on file   Highest education level: Master's degree (e.g., MA, MS, MEng, MEd,  MSW, MBA)  Occupational History   Not on file  Tobacco Use   Smoking status: Former    Types: Cigarettes   Smokeless tobacco: Never  Vaping Use   Vaping Use: Some days   Substances: Nicotine, Flavoring  Substance and Sexual Activity   Alcohol use: Not Currently    Comment: occasional   Drug use: Not Currently   Sexual activity: Yes    Birth control/protection: Surgical  Other Topics Concern   Not on file  Social History Narrative   ** Merged History Encounter **       Social Determinants of Health   Financial Resource Strain:  Not on file  Food Insecurity: Not on file  Transportation Needs: Not on file  Physical Activity: Not on file  Stress: Not on file  Social Connections: Not on file   Additional Social History:   Sleep:  6.5  Appetite:  Good  Current Medications: Current Facility-Administered Medications  Medication Dose Route Frequency Provider Last Rate Last Admin   ARIPiprazole (ABILIFY) tablet 15 mg  15 mg Oral Daily Massengill, Ovid Curd, MD   15 mg at 05/18/21 0738   atorvastatin (LIPITOR) tablet 80 mg  80 mg Oral Daily Rankin, Shuvon B, NP   80 mg at 05/18/21 0738   benztropine (COGENTIN) tablet 0.5 mg  0.5 mg Oral BID Lindell Spar I, NP       divalproex (DEPAKOTE) DR tablet 250 mg  250 mg Oral BID Rankin, Shuvon B, NP   250 mg at 05/18/21 0738   haloperidol (HALDOL) tablet 5 mg  5 mg Oral BID Lindell Spar I, NP       hydrOXYzine (ATARAX/VISTARIL) tablet 25 mg  25 mg Oral TID PRN Prescilla Sours, PA-C   25 mg at 05/17/21 2131   insulin aspart (novoLOG) injection 0-5 Units  0-5 Units Subcutaneous QHS Prescilla Sours, PA-C   3 Units at 05/16/21 2254   insulin aspart (novoLOG) injection 0-9 Units  0-9 Units Subcutaneous TID WC Rankin, Shuvon B, NP   2 Units at 05/18/21 0622   insulin glargine-yfgn (SEMGLEE) injection 16 Units  16 Units Subcutaneous Daily Massengill, Ovid Curd, MD   16 Units at 05/18/21 0829   lisinopril (ZESTRIL) tablet 5 mg  5 mg Oral Daily Rankin, Shuvon B, NP   5 mg at 05/18/21 0738   metFORMIN (GLUCOPHAGE) tablet 1,000 mg  1,000 mg Oral BID WC Rankin, Shuvon B, NP   1,000 mg at 05/18/21 0738   OLANZapine (ZYPREXA) injection 5 mg  5 mg Intramuscular BID PRN Massengill, Ovid Curd, MD       OLANZapine (ZYPREXA) tablet 5 mg  5 mg Oral BID PRN Massengill, Ovid Curd, MD       traZODone (DESYREL) tablet 50 mg  50 mg Oral QHS PRN Margorie John W, PA-C   50 mg at 05/17/21 2131   Lab Results:  Results for orders placed or performed during the hospital encounter of 05/16/21 (from the past 48 hour(s))   Glucose, capillary     Status: Abnormal   Collection Time: 05/16/21  9:42 PM  Result Value Ref Range   Glucose-Capillary 291 (H) 70 - 99 mg/dL    Comment: Glucose reference range applies only to samples taken after fasting for at least 8 hours.  Rapid urine drug screen (hospital performed)     Status: Abnormal   Collection Time: 05/17/21  6:17 AM  Result Value Ref Range   Opiates NONE DETECTED NONE DETECTED   Cocaine NONE DETECTED  NONE DETECTED   Benzodiazepines NONE DETECTED NONE DETECTED   Amphetamines NONE DETECTED NONE DETECTED   Tetrahydrocannabinol POSITIVE (A) NONE DETECTED   Barbiturates NONE DETECTED NONE DETECTED    Comment: (NOTE) DRUG SCREEN FOR MEDICAL PURPOSES ONLY.  IF CONFIRMATION IS NEEDED FOR ANY PURPOSE, NOTIFY LAB WITHIN 5 DAYS.  LOWEST DETECTABLE LIMITS FOR URINE DRUG SCREEN Drug Class                     Cutoff (ng/mL) Amphetamine and metabolites    1000 Barbiturate and metabolites    200 Benzodiazepine                 329 Tricyclics and metabolites     300 Opiates and metabolites        300 Cocaine and metabolites        300 THC                            50 Performed at [facility], [place] [address], [place], [place] [address]   Glucose, capillary     Status: Abnormal   Collection Time: 05/17/21  6:17 AM  Result Value Ref Range   Glucose-Capillary 184 (H) 70 - 99 mg/dL    Comment: Glucose reference range applies only to samples taken after fasting for at least 8 hours.   Comment 1 Notify RN    Comment 2 Document in Chart   Lipid panel     Status: Abnormal   Collection Time: 05/17/21  6:24 AM  Result Value Ref Range   Cholesterol 101 0 - 200 mg/dL   Triglycerides 65 <150 mg/dL   HDL 32 (L) >40 mg/dL   Total CHOL/HDL Ratio 3.2 RATIO   VLDL 13 0 - 40 mg/dL   LDL Cholesterol 56 0 - 99 mg/dL    Comment:        Total Cholesterol/HDL:CHD Risk Coronary Heart Disease Risk Table                     Men   Women  1/2 Average Risk    3.4   3.3  Average Risk       5.0   4.4  2 X Average Risk   9.6   7.1  3 X Average Risk  23.4   11.0        Use the calculated Patient Ratio above and the CHD Risk Table to determine the patient's CHD Risk.        ATP III CLASSIFICATION (LDL):  <100     mg/dL   Optimal  100-129  mg/dL   Near or Above                    Optimal  130-159  mg/dL   Borderline  160-189  mg/dL   High  >190     mg/dL   Very High Performed at [place] [address] [place], [place] [address]   TSH     Status: Abnormal   Collection Time: 05/17/21  6:24 AM  Result Value Ref Range   TSH 0.198 (L) 0.350 - 4.500 uIU/mL    Comment: Performed by a 3rd Generation assay with a functional sensitivity of <=0.01 uIU/mL. Performed at [facility], [place] [address] [place], [place] [address]   Valproic acid level     Status: Abnormal   Collection Time: 05/17/21  6:24 AM  Result Value Ref Range   Valproic Acid Lvl 45 (L) 50.0 - 100.0 ug/mL    Comment: Performed at [facility], [place] [address] [place], [place] [address]  Glucose, capillary     Status: Abnormal   Collection Time: 05/17/21 11:50 AM  Result Value Ref Range   Glucose-Capillary 178 (H) 70 - 99 mg/dL    Comment: Glucose reference range applies only to samples taken after fasting for at least 8 hours.  Urinalysis, Complete w Microscopic Urine, Clean Catch     Status: Abnormal   Collection Time: 05/17/21  2:35 PM  Result Value Ref Range   Color, Urine YELLOW YELLOW   APPearance CLEAR CLEAR   Specific Gravity, Urine 1.015 1.005 - 1.030   pH 5.0 5.0 - 8.0   Glucose, UA NEGATIVE NEGATIVE mg/dL   Hgb urine dipstick NEGATIVE NEGATIVE   Bilirubin Urine NEGATIVE NEGATIVE   Ketones, ur 5 (A) NEGATIVE mg/dL   Protein, ur NEGATIVE NEGATIVE mg/dL   Nitrite NEGATIVE NEGATIVE   Leukocytes,Ua NEGATIVE NEGATIVE   RBC / HPF 0-5 0 - 5 RBC/hpf   WBC, UA 0-5 0 - 5 WBC/hpf   Bacteria, UA NONE SEEN NONE  SEEN   Squamous Epithelial / LPF 6-10 0 - 5   Mucus PRESENT     Comment: Performed at [facility], [place] [address] [place], [place] [address]  Glucose, capillary     Status: Abnormal   Collection Time: 05/17/21  4:40 PM  Result Value Ref Range   Glucose-Capillary 208 (H) 70 - 99 mg/dL    Comment: Glucose reference range applies only to samples taken after fasting for at least 8 hours.   Comment 1 Notify RN   Glucose, capillary     Status: Abnormal   Collection Time: 05/17/21  8:31 PM  Result Value Ref Range   Glucose-Capillary 200 (H) 70 - 99 mg/dL    Comment: Glucose reference range applies only to samples taken after fasting for at least 8 hours.  Glucose, capillary     Status: Abnormal   Collection Time: 05/18/21  6:16 AM  Result Value Ref Range   Glucose-Capillary 164 (H) 70 - 99 mg/dL    Comment: Glucose reference range applies only to samples taken after fasting for at least 8 hours.   Comment 1 Notify RN    Comment 2 Document in Chart    Blood Alcohol level:  Lab Results  Component Value Date   ETH <10 05/12/2021   ETH <10 26/83/4196   Metabolic Disorder Labs: Lab Results  Component Value Date   HGBA1C 9.2 (H) 05/12/2021   MPG 217.34 05/12/2021   MPG 209 08/19/2020   Lab Results  Component Value Date   PROLACTIN 11.7 05/17/2018   PROLACTIN 105.5 (H) 02/28/2017   Lab Results  Component Value Date   CHOL 101 05/17/2021   TRIG 65 05/17/2021   HDL 32 (L) 05/17/2021   CHOLHDL 3.2 05/17/2021   VLDL 13 05/17/2021   LDLCALC 56 05/17/2021   LDLCALC 49 08/18/2020   Physical Findings: AIMS:  , ,  ,  ,    CIWA:    COWS:     Musculoskeletal: Strength & Muscle Tone: within normal limits Gait & Station: normal Patient leans: N/A  Psychiatric Specialty Exam:  Presentation  General Appearance: Casual; Fairly Groomed  Eye Contact:Fair  Speech:Clear and Coherent; Slow; Other (comment) (Some latency of speech noted.)  Speech  Volume:Decreased  Handedness:Right  Mood and Affect  Mood:Dysphoric  Affect:Congruent; Tearful; Labile  Thought Process  Thought Processes:Disorganized  Descriptions of Associations:Tangential  Orientation:Partial  Thought Content:Delusions; Illogical; Paranoid Ideation; Tangential  History of Schizophrenia/Schizoaffective disorder:Yes  Duration of Psychotic Symptoms:Greater than six months  Hallucinations:Hallucinations: Other (comment) Description of Auditory Hallucinations: Responding to internal stimuli.  Ideas of Reference:Delusions; Paranoia  Suicidal Thoughts:Suicidal Thoughts: No SI Passive Intent and/or Plan: Without Intent; Without Plan; Without Means to Carry Out; Without Access to Means  Homicidal Thoughts:Homicidal Thoughts: No  Sensorium  Memory:Immediate Fair; Recent Fair; Remote Poor  Judgment:Impaired  Insight:Poor  Executive Functions  Concentration:Fair  Attention Span:Fair  [place] of Knowledge:Poor  Language:Fair  Psychomotor Activity  Psychomotor Activity:Psychomotor Activity: Decreased  Assets  Assets:Desire for Improvement; Resilience  Sleep  Sleep:Sleep: Good Number of Hours of Sleep: 6.5  Physical Exam: Physical Exam Vitals and nursing note reviewed.  HENT:     Mouth/Throat:     Pharynx: Oropharynx is clear.  Eyes:     Pupils: Pupils are equal, round, and reactive to light.  Cardiovascular:     Pulses: Normal pulses.     Comments: Elevated b/p: 138/91.   Hx. HTN (essential). Patient is currently in no apparent distress. Pulmonary:     Effort: Pulmonary effort is normal.  Genitourinary:    Comments: Deferred Musculoskeletal:        General: Normal range of motion.     Cervical back: Normal range of motion.  Skin:    General: Skin is warm and dry.  Neurological:     Mental Status: She is alert and oriented to person, place, and time.     Comments: Oriented x 2 (self/situation).   Review of Systems   Constitutional:  Negative for chills, diaphoresis and fever.  HENT:  Negative for congestion and sore throat.   Eyes:  Negative for blurred vision.  Respiratory:  Negative for cough, shortness of breath and wheezing.   Cardiovascular:  Negative for chest pain and palpitations.  Gastrointestinal:  Negative for abdominal pain, constipation, diarrhea, heartburn, nausea and vomiting.  Genitourinary:  Negative for dysuria.  Musculoskeletal:  Negative for joint pain and myalgias.  Skin: Negative.   Neurological:  Negative for dizziness, tingling, tremors, sensory change, speech change, focal weakness, seizures, loss of consciousness, weakness and headaches.  Psychiatric/Behavioral:  Positive for depression, hallucinations (Presents paranoid, delusional, responding to internal stimuli.) and substance abuse (Hx. THC use disorder). Negative for memory loss and suicidal ideas. The patient is not nervous/anxious and does not have insomnia.   Blood pressure (!) 138/91, pulse (!) 107, temperature 98.2 F (36.8 C), temperature source Oral, resp. rate 16, height 5\' 11"  (1.803 m), weight 114.8 kg, SpO2 100 %. Body mass index is 35.29 kg/m.  Treatment Plan Summary: Daily contact with patient to assess and evaluate symptoms and progress in treatment and Medication management.   Continue inpatient hospitalization.  Will continue today 05/18/2021 plan as below except where it is noted.   Diagnoses:  Schizoaffective disorder, bipolar-type.  Cannabis use disorder.  Other medical issues:  Diabetes Mellitus.  HTN (essential).  Plan.  Schizoaffective disorder:  Continue Abilify 15 mg po daily. Initiated Haldol 5 mg po bid (starting today 05-18-21).  Continue Depakote 25 mg po bid .  EPS prevention. Initiated Cogentin 0.5 mg po bid (starting today 05-18-21).   Anxiety.  Continue Vistaril 25 mg po tid prn.  Insomnia.  Continue Trazodone 50 mg po Q hs prn.  Agitation/psychosis.  Continue  Olanzapine 5 mg po  or IM bid prn.  Other medical issues, continue:  Lipitor 80 mg po daily for high cholesterol.  Insulin glargine-yfgn 16 units subQ daily for DM. Metformin 1,000 mg po bid for diabetes mellitus. Lisinopril 5 mg po daily for hTN. Continue the sliding scale insulin as recommended per CBG results.  Encourage group attendance/participation. Discharge disposition plan in progress.  Lindell Spar, NP, pmhnp, fnp-bc 05/18/2021, 11:31 AM

## 2021-05-18 NOTE — [facility] Group Notes (Addendum)
Pt presents with paranoid thoughts, disorganized thoughts and disorganized thinking. She stated to Probation officer '' I need to speak with you now about what's going on. '' The patient reports that '' I saw that man in the burgundy scrubs, I know he is talking about me, I don't know him at all but he clearly knows all my information and is spreading lies about me. '' Patient has at times incomplete sentences that she states to writer and then stares as if staff should be responding in different manner. She stated '' he dropped the tray, he had two trays, and when he dropped them, I knew something was up, it was about to happen so I called 911 non emergency. '' Informed and educated patient that she is not to call 911 and to voice concerns with staff. During med pass another patient stepped behind her and she was somewhat verbally aggressive demanding the other patient step back further, no boundary violation noted as the patient was behind her beyond 3 feet. She did accept po medications this am, education provided. Discussed above with treatment team as concerns for acute decompensation noted by increased paranoia and agitated. Patient moved to 500 hall due to above. MD informed of above. Pt self inventory reviewed and noted paranoid thinking as patient reported her goal is to [employer] file a police report, meet, with doctor, sw and charge Nurse'' Pt then stated '' I have been over hearing talks on the phone and to each other about a patient trying to poison me. The nurses are great just need help with securing my safety in healing with the fellow patient. ''

## 2021-05-18 NOTE — [facility] Group Notes (Signed)
Patient did not attend the Wrap-Up group.

## 2021-05-18 NOTE — Group Note (Signed)
[facility] LCSW Group Therapy Note   Group Date: 05/18/2021 Start Time: 1300 End Time: 1400   Type of Therapy/Topic:  Group Therapy:  Emotion Regulation  Participation Level:  Active   Mood:  Description of Group:    The purpose of this group is to assist patients in learning to regulate negative emotions and experience positive emotions. Patients will be guided to discuss ways in which they have been vulnerable to their negative emotions. These vulnerabilities will be juxtaposed with experiences of positive emotions or situations, and patients challenged to use positive emotions to combat negative ones. Special emphasis will be placed on coping with negative emotions in conflict situations, and patients will process healthy conflict resolution skills.  Therapeutic Goals: Patient will identify two positive emotions or experiences to reflect on in order to balance out negative emotions:  Patient will label two or more emotions that they find the most difficult to experience:  Patient will be able to demonstrate positive conflict resolution skills through discussion or role plays:   Summary of Patient Progress: Due to the acuity and complex discharge plans, group was not held. Patient was provided therapeutic worksheets and asked to meet with CSW as needed.   Therapeutic Modalities:   Cognitive Behavioral Therapy Feelings Identification Dialectical Behavioral Therapy   Darleen Crocker, LCSWA

## 2021-05-18 NOTE — BH IP Treatment Plan (Signed)
Interdisciplinary Treatment and Diagnostic Plan Update  05/18/2021 Time of Session: 9:35am  [name] MRN: [id number]  Principal Diagnosis: Schizoaffective disorder, bipolar type ([place])  Secondary Diagnoses: Principal Problem:   Schizoaffective disorder, bipolar type ([place]) Active Problems:   Cannabis use disorder, moderate, dependence ([place])   Current Medications:  Current Facility-Administered Medications  Medication Dose Route Frequency Provider Last Rate Last Admin   ARIPiprazole (ABILIFY) tablet 15 mg  15 mg Oral Daily Massengill, Ovid Curd, MD   15 mg at 05/18/21 9826   atorvastatin (LIPITOR) tablet 80 mg  80 mg Oral Daily Rankin, Shuvon B, NP   80 mg at 05/18/21 0738   benztropine (COGENTIN) tablet 0.5 mg  0.5 mg Oral BID Lindell Spar I, NP       divalproex (DEPAKOTE) DR tablet 250 mg  250 mg Oral BID Rankin, Shuvon B, NP   250 mg at 05/18/21 4158   haloperidol (HALDOL) tablet 5 mg  5 mg Oral BID Lindell Spar I, NP       hydrOXYzine (ATARAX/VISTARIL) tablet 25 mg  25 mg Oral TID PRN Prescilla Sours, PA-C   25 mg at 05/17/21 2131   insulin aspart (novoLOG) injection 0-5 Units  0-5 Units Subcutaneous QHS Prescilla Sours, PA-C   3 Units at 05/16/21 2254   insulin aspart (novoLOG) injection 0-9 Units  0-9 Units Subcutaneous TID WC Rankin, Shuvon B, NP   2 Units at 05/18/21 0622   insulin glargine-yfgn (SEMGLEE) injection 16 Units  16 Units Subcutaneous Daily Massengill, Ovid Curd, MD   16 Units at 05/18/21 0829   lisinopril (ZESTRIL) tablet 5 mg  5 mg Oral Daily Rankin, Shuvon B, NP   5 mg at 05/18/21 3094   metFORMIN (GLUCOPHAGE) tablet 1,000 mg  1,000 mg Oral BID WC Rankin, Shuvon B, NP   1,000 mg at 05/18/21 0738   OLANZapine (ZYPREXA) injection 5 mg  5 mg Intramuscular BID PRN Massengill, Ovid Curd, MD       OLANZapine (ZYPREXA) tablet 5 mg  5 mg Oral BID PRN Janine Limbo, MD       traZODone (DESYREL) tablet 50 mg  50 mg Oral QHS PRN Margorie John W, PA-C   50 mg at 05/17/21 2131   PTA  Medications: Medications Prior to Admission  Medication Sig Dispense Refill Last Dose   Accu-Chek Softclix Lancets lancets Use to check FSBS BID. Dx: E11.42 100 each 2    ARIPiprazole (ABILIFY) 10 MG tablet Take 1 tablet (10 mg total) by mouth daily. 30 tablet 0    atorvastatin (LIPITOR) 80 MG tablet Take 1 tablet (80 mg total) by mouth daily. 90 tablet 0    Blood Glucose Monitoring Suppl (ACCU-CHEK GUIDE ME) w/Device KIT Use to check FSBS BID. Dx: E11.42 1 kit 0    Blood Glucose Monitoring Suppl (TRUE METRIX METER) w/Device KIT Use as directed 1 kit 0    dapagliflozin propanediol (FARXIGA) 10 MG TABS tablet Take 1 tablet (10 mg total) by mouth daily before breakfast. 90 tablet 0    divalproex (DEPAKOTE) 250 MG DR tablet Take 1 tablet (250 mg total) by mouth 2 (two) times daily. 60 tablet 0    glucose blood (TRUE METRIX BLOOD GLUCOSE TEST) test strip Use as instructed 100 each 12    Insulin Pen Needle (TRUEPLUS PEN NEEDLES) 32G X 4 MM MISC Use as instructed to inject Victoza daily. 100 each 0    liraglutide (VICTOZA) 18 MG/3ML SOPN INJECT 1.8 MG UNDER THE SKIN EVERY DAY (Patient  taking differently: Inject 1.8 mg into the skin daily.) 9 mL 0    lisinopril (ZESTRIL) 5 MG tablet Take 1 tablet (5 mg total) by mouth daily. 90 tablet 0    LORazepam (ATIVAN) 1 MG tablet Take 1 tablet (1 mg total) by mouth 3 (three) times daily. 90 tablet 0    metFORMIN (GLUCOPHAGE) 1000 MG tablet Take 1 tablet (1,000 mg total) by mouth 2 (two) times daily with a meal. 180 tablet 0    neomycin-polymyxin-hydrocortisone (CORTISPORIN) 3.5-10000-1 OTIC suspension Place 4 drops into the right ear 3 (three) times daily. (Patient not taking: No sig reported) 10 mL 0    nitrofurantoin, macrocrystal-monohydrate, (MACROBID) 100 MG capsule Take 1 capsule (100 mg total) by mouth 2 (two) times daily. (Patient taking differently: Take 100 mg by mouth See admin instructions. Bid x 5 days) 10 capsule 0    TRUEplus Lancets 28G MISC Use as  directed 100 each 1     Patient Stressors: Other: unable to verbalize    Patient Strengths: Capable of independent living  Communication skills  Motivation for treatment/growth  Supportive family/friends  Work skills   Treatment Modalities: Medication Management, Group therapy, Case management,  1 to 1 session with clinician, Psychoeducation, Recreational therapy.   Physician Treatment Plan for Primary Diagnosis: Schizoaffective disorder, bipolar type ([place]) Long Term Goal(s): Improvement in symptoms so as ready for discharge   Short Term Goals: Ability to identify changes in lifestyle to reduce recurrence of condition will improve Ability to verbalize feelings will improve Ability to disclose and discuss suicidal ideas Ability to demonstrate self-control will improve Ability to identify and develop effective coping behaviors will improve Ability to maintain clinical measurements within normal limits will improve Compliance with prescribed medications will improve Ability to identify triggers associated with substance abuse/mental health issues will improve  Medication Management: Evaluate patient's response, side effects, and tolerance of medication regimen.  Therapeutic Interventions: 1 to 1 sessions, Unit Group sessions and Medication administration.  Evaluation of Outcomes: Not Met  Physician Treatment Plan for Secondary Diagnosis: Principal Problem:   Schizoaffective disorder, bipolar type ([place]) Active Problems:   Cannabis use disorder, moderate, dependence ([place])  Long Term Goal(s): Improvement in symptoms so as ready for discharge   Short Term Goals: Ability to identify changes in lifestyle to reduce recurrence of condition will improve Ability to verbalize feelings will improve Ability to disclose and discuss suicidal ideas Ability to demonstrate self-control will improve Ability to identify and develop effective coping behaviors will improve Ability to maintain  clinical measurements within normal limits will improve Compliance with prescribed medications will improve Ability to identify triggers associated with substance abuse/mental health issues will improve     Medication Management: Evaluate patient's response, side effects, and tolerance of medication regimen.  Therapeutic Interventions: 1 to 1 sessions, Unit Group sessions and Medication administration.  Evaluation of Outcomes: Not Met   RN Treatment Plan for Primary Diagnosis: Schizoaffective disorder, bipolar type ([place]) Long Term Goal(s): Knowledge of disease and therapeutic regimen to maintain health will improve  Short Term Goals: Ability to remain free from injury will improve, Ability to demonstrate self-control, Ability to participate in decision making will improve, Ability to verbalize feelings will improve, Ability to disclose and discuss suicidal ideas, and Ability to identify and develop effective coping behaviors will improve  Medication Management: RN will administer medications as ordered by provider, will assess and evaluate patient's response and provide education to patient for prescribed medication. RN will report any adverse and/or  side effects to prescribing provider.  Therapeutic Interventions: 1 on 1 counseling sessions, Psychoeducation, Medication administration, Evaluate responses to treatment, Monitor vital signs and CBGs as ordered, Perform/monitor CIWA, COWS, AIMS and Fall Risk screenings as ordered, Perform wound care treatments as ordered.  Evaluation of Outcomes: Not Met   LCSW Treatment Plan for Primary Diagnosis: Schizoaffective disorder, bipolar type ([place]) Long Term Goal(s): Safe transition to appropriate next level of care at discharge, Engage patient in therapeutic group addressing interpersonal concerns.  Short Term Goals: Engage patient in aftercare planning with referrals and resources, Increase social support, Increase emotional regulation, Facilitate  acceptance of mental health diagnosis and concerns, Identify triggers associated with mental health/substance abuse issues, and Increase skills for wellness and recovery  Therapeutic Interventions: Assess for all discharge needs, 1 to 1 time with Social worker, Explore available resources and support systems, Assess for adequacy in community support network, Educate family and significant other(s) on suicide prevention, Complete Psychosocial Assessment, Interpersonal group therapy.  Evaluation of Outcomes: Not Met   Progress in Treatment: Attending groups: No. Participating in groups: No. Taking medication as prescribed: Yes. Toleration medication: Yes. Family/Significant other contact made: No, will contact:  If consents are provided  Patient understands diagnosis: No. Discussing patient identified problems/goals with staff: Yes. Medical problems stabilized or resolved: Yes. Denies suicidal/homicidal ideation: Yes. Issues/concerns per patient self-inventory: No.   New problem(s) identified: No, Describe:  None   New Short Term/Long Term Goal(s): medication stabilization, elimination of SI thoughts, development of comprehensive mental wellness plan.   Patient Goals: "To speak with a Education officer, museum and a doctor"   Discharge Plan or Barriers: Patient recently admitted. CSW will continue to follow and assess for appropriate referrals and possible discharge planning.   Reason for Continuation of Hospitalization: Anxiety Delusions  Medication stabilization  Estimated Length of Stay: 3 to 5 days    Scribe for Treatment Team: Darleen Crocker, Latanya Presser 05/18/2021 3:01 PM

## 2021-05-19 LAB — GLUCOSE, CAPILLARY
Glucose-Capillary: 135 mg/dL — ABNORMAL HIGH (ref 70–99)
Glucose-Capillary: 256 mg/dL — ABNORMAL HIGH (ref 70–99)
Glucose-Capillary: 212 mg/dL — ABNORMAL HIGH (ref 70–99)
Glucose-Capillary: 146 mg/dL — ABNORMAL HIGH (ref 70–99)

## 2021-05-19 MED ORDER — BENZTROPINE MESYLATE 1 MG PO TABS
1.0000 mg | ORAL_TABLET | Freq: Two times a day (BID) | ORAL | Status: DC
  Administered 2021-05-19 – 2021-05-22 (×6): 1 mg via ORAL
  Filled 2021-05-19 (×9): qty 1

## 2021-05-19 MED ORDER — PNEUMOCOCCAL VAC POLYVALENT 25 MCG/0.5ML IJ INJ
0.5000 mL | INJECTION | INTRAMUSCULAR | Status: AC
  Administered 2021-05-20: 0.5 mL via INTRAMUSCULAR
  Filled 2021-05-19: qty 0.5

## 2021-05-19 MED ORDER — INFLUENZA VAC SPLIT QUAD 0.5 ML IM SUSY
0.5000 mL | PREFILLED_SYRINGE | INTRAMUSCULAR | Status: AC
  Administered 2021-05-20: 0.5 mL via INTRAMUSCULAR
  Filled 2021-05-19: qty 0.5

## 2021-05-19 MED ORDER — DIVALPROEX SODIUM 500 MG PO DR TAB
500.0000 mg | DELAYED_RELEASE_TABLET | Freq: Two times a day (BID) | ORAL | Status: DC
  Administered 2021-05-19 – 2021-06-01 (×26): 500 mg via ORAL
  Filled 2021-05-19 (×37): qty 1

## 2021-05-19 MED ORDER — PROPRANOLOL HCL 10 MG PO TABS
10.0000 mg | ORAL_TABLET | Freq: Two times a day (BID) | ORAL | Status: DC
  Administered 2021-05-19 – 2021-06-01 (×26): 10 mg via ORAL
  Filled 2021-05-19 (×30): qty 1

## 2021-05-19 MED ORDER — HALOPERIDOL 1 MG PO TABS
1.0000 mg | ORAL_TABLET | Freq: Two times a day (BID) | ORAL | Status: DC
  Filled 2021-05-19 (×2): qty 1

## 2021-05-19 MED ORDER — HALOPERIDOL 5 MG PO TABS
10.0000 mg | ORAL_TABLET | Freq: Two times a day (BID) | ORAL | Status: DC
  Administered 2021-05-19 – 2021-05-22 (×6): 10 mg via ORAL
  Filled 2021-05-19 (×9): qty 2

## 2021-05-19 NOTE — [facility] Group Notes (Signed)
Adult Psychoeducational Group Note  Date:  05/19/2021 Time:  9:31 AM  Group Topic/Focus:  Goals Group:   The focus of this group is to help patients establish daily goals to achieve during treatment and discuss how the patient can incorporate goal setting into their daily lives to aide in recovery.  Participation Level:  Did Not Attend   Additional Comments:  Pt crying in the hallway, too emotional to attend group  Dub Mikes 05/19/2021, 9:31 AM

## 2021-05-19 NOTE — Progress Notes (Signed)
Patient slept after taking prn Zyprexa and trazodone at hs. This evening Pt had been very anxious and agitated. With selective mutism just crying, uncontrollable. Support and encouragement provided. Patient refused to answer Suicide questions. Patient remains safe.

## 2021-05-19 NOTE — Group Note (Signed)
Recreation Therapy Group Note   Group Topic:Team Building  Group Date: 05/19/2021 Start Time: 1000 End Time: 1035 Facilitators: Victorino Sparrow, LRT,CTRS Location: [address] Dayroom   Goal Area(s) Addresses:  Patient will effectively work with peer towards shared goal.  Patient will identify skill used to make activity successful.  Patient will identify how skills used during activity can be used to reach post d/c goals.   Group Description:  Patient(s) were given a set of solo cups, a rubber band, and some tied strings. The objective is to build a pyramid with the cups by only using the rubber band and string to move the cups. After the activity the patient(s) and LRT debriefed and discussed what strategies worked, what didn't, and what lessons they can take from the activity and use in life post discharge.    Affect/Mood: N/A   Participation Level: Did not attend    Clinical Observations/Individualized Feedback: Pt did not attend group.    Plan: Continue to engage patient in RT group sessions 2-3x/week.   Victorino Sparrow, LRT,CTRS 05/19/2021 11:13 AM

## 2021-05-19 NOTE — Progress Notes (Signed)
   05/19/21 1200  Psych Admission Type (Psych Patients Only)  Admission Status Involuntary  Psychosocial Assessment  Patient Complaints Crying spells;Irritability  Eye Contact Staring  Facial Expression Wide-eyed  Affect Anxious  Speech Logical/coherent  Interaction Assertive  Motor Activity Slow  Appearance/Hygiene In scrubs  Behavior Characteristics Unwilling to participate  Mood Preoccupied;Suspicious  Thought Process  Coherency WDL  Content Preoccupation  Delusions Paranoid  Perception WDL  Hallucination None reported or observed  Judgment Limited  Confusion None  Danger to Self  Current suicidal ideation? Denies  Danger to Others  Danger to Others None reported or observed

## 2021-05-19 NOTE — Progress Notes (Signed)
[facility] MD Progress Note  05/19/2021 10:12 AM [name]  MRN:  [id number]  Subjective: [name] reports, "I did not sleep well last night because of the girl next door.  She has been saying stuff about me. I need to report a crime to the police. I know what happened here last night. I don't want be around this people around here because I don't know what they do to me. I don't feel safe here". Daily notes: [name] is seen. Chart reviewed. The chart findings discussed with the treatment team. She presents alert & highly delusional & paranoid. She reported yesterday that she thought prior to her admission that people were following her, listening to her & trying to track her movements & whereabout. She said that these has caused her a lot of stress prior to coming to the hospital. Today, she is fixated on another patient that this patient has been talking about her. She says she wants to report a crime to the cops about what happened here last night. She says she does not feel safe here. She stated yesterday that she wanted to get better, become less jumpy, start feeling normal again. She says her goal is to become normal again. And when feeling normal, she can do basic things like cooking, work & travel. She says when not normal, she can become erratic & call the police just to reach out. Today, there has not been much changes in her symptoms. She seem like she is thought blocking as well as responding to internal stimuli. She does exihib some latency of speech. We have increased her Haldol from 5 mg bid to Haldol 10 mg po bid for her mood symptoms & Cogentin from 0.5 mg bid to 1 mg po bid to prevent eps. Her Depakote has been increased as well to 500 mg po bid for mood stabilization. Will obtain valproic level on 05-24-21 am. She is also started on Propranolol 10 mg bid for elevated heart rate/anxiety. We will obtain U/A for complain of urinary symptoms. There are other labs pending collection. We will continue  current plan of care as already in progress. Patient may be a candidate for antipsychotic monthly injectable. We will continue to treat her symptoms, evaluate her response to treatments on daily basis to make adequate treatment decision for her symptoms prior to discharge.  Reason for admission: 45 year old female with a reported psychiatric history of "schizoaffective, bipolar, and personality" who was admitted to this psychiatric unit for disorientation, confusion, hallucinations, suicidal thoughts, and delusions. During the her admission interview, about details of her HPI.  She reports that she was driving from [place], to [place], realized that she was not thinking clearly and confused, and went to the [facility] for evaluation.    Principal Problem: Schizoaffective disorder, bipolar type ([place])  Diagnosis: Principal Problem:   Schizoaffective disorder, bipolar type ([place]) Active Problems:   Cannabis use disorder, moderate, dependence ([place])  Total Time spent with patient:  35 minutes  Past Psychiatric History: Schizoaffective disorder, bipolar-type.  Past Medical History:  Past Medical History:  Diagnosis Date   Abnormal Pap smear    Bipolar 1 disorder ([place])    Diabetes mellitus without complication ([place])    Fibroids    Hypertension    IBS (irritable bowel syndrome)     Past Surgical History:  Procedure Laterality Date   CERVICAL BIOPSY     CHOLECYSTECTOMY     ESSURE TUBAL LIGATION     HIATAL HERNIA REPAIR  TUBAL LIGATION     Family History:  Family History  Problem Relation Age of Onset   Diabetes Father    Diabetes Paternal Grandmother    Diabetes Maternal Grandmother    Mental illness Cousin    Healthy Mother    Family Psychiatric  History: See H&P.  Social History:  Social History   Substance and Sexual Activity  Alcohol Use Not Currently   Comment: occasional     Social History   Substance and Sexual Activity  Drug Use Not Currently     Social History   Socioeconomic History   Marital status: Single    Spouse name: Not on file   Number of children: 2   Years of education: Not on file   Highest education level: Master's degree (e.g., MA, MS, MEng, MEd, MSW, MBA)  Occupational History   Not on file  Tobacco Use   Smoking status: Former    Types: Cigarettes   Smokeless tobacco: Never  Vaping Use   Vaping Use: Some days   Substances: Nicotine, Flavoring  Substance and Sexual Activity   Alcohol use: Not Currently    Comment: occasional   Drug use: Not Currently   Sexual activity: Yes    Birth control/protection: Surgical  Other Topics Concern   Not on file  Social History Narrative   ** Merged History Encounter **       Social Determinants of Health   Financial Resource Strain: Not on file  Food Insecurity: Not on file  Transportation Needs: Not on file  Physical Activity: Not on file  Stress: Not on file  Social Connections: Not on file   Additional Social History:   Sleep:  6.5  Appetite:  Good  Current Medications: Current Facility-Administered Medications  Medication Dose Route Frequency Provider Last Rate Last Admin   ARIPiprazole (ABILIFY) tablet 15 mg  15 mg Oral Daily Massengill, Nathan, MD   15 mg at 05/19/21 0854   atorvastatin (LIPITOR) tablet 80 mg  80 mg Oral Daily Rankin, Shuvon B, NP   80 mg at 05/19/21 2841   benztropine (COGENTIN) tablet 0.5 mg  0.5 mg Oral BID Lindell Spar I, NP   0.5 mg at 05/19/21 0852   divalproex (DEPAKOTE) DR tablet 250 mg  250 mg Oral BID Rankin, Shuvon B, NP   250 mg at 05/19/21 3244   haloperidol (HALDOL) tablet 5 mg  5 mg Oral BID Lindell Spar I, NP   5 mg at 05/19/21 0102   hydrOXYzine (ATARAX/VISTARIL) tablet 25 mg  25 mg Oral TID PRN Prescilla Sours, PA-C   25 mg at 05/18/21 2024   [START ON 05/20/2021] influenza vac split quadrivalent PF (FLUARIX) injection 0.5 mL  0.5 mL Intramuscular Tomorrow-1000 Massengill, Nathan, MD       insulin aspart  (novoLOG) injection 0-5 Units  0-5 Units Subcutaneous QHS Prescilla Sours, PA-C   3 Units at 05/16/21 2254   insulin aspart (novoLOG) injection 0-9 Units  0-9 Units Subcutaneous TID WC Rankin, Shuvon B, NP   1 Units at 05/19/21 0702   insulin glargine-yfgn (SEMGLEE) injection 16 Units  16 Units Subcutaneous Daily Massengill, Ovid Curd, MD   16 Units at 05/19/21 0854   lisinopril (ZESTRIL) tablet 5 mg  5 mg Oral Daily Rankin, Shuvon B, NP   5 mg at 05/19/21 0832   metFORMIN (GLUCOPHAGE) tablet 1,000 mg  1,000 mg Oral BID WC Rankin, Shuvon B, NP   1,000 mg at 05/19/21 0832   OLANZapine (  ZYPREXA) injection 5 mg  5 mg Intramuscular BID PRN Massengill, Ovid Curd, MD       OLANZapine (ZYPREXA) tablet 5 mg  5 mg Oral BID PRN Janine Limbo, MD   5 mg at 05/18/21 2024   [START ON 05/20/2021] pneumococcal 23 valent vaccine (PNEUMOVAX-23) injection 0.5 mL  0.5 mL Intramuscular Tomorrow-1000 Massengill, Ovid Curd, MD       traZODone (DESYREL) tablet 50 mg  50 mg Oral QHS PRN Prescilla Sours, PA-C   50 mg at 05/18/21 2024   Lab Results:  Results for orders placed or performed during the hospital encounter of 05/16/21 (from the past 48 hour(s))  Glucose, capillary     Status: Abnormal   Collection Time: 05/17/21 11:50 AM  Result Value Ref Range   Glucose-Capillary 178 (H) 70 - 99 mg/dL    Comment: Glucose reference range applies only to samples taken after fasting for at least 8 hours.  Urinalysis, Complete w Microscopic Urine, Clean Catch     Status: Abnormal   Collection Time: 05/17/21  2:35 PM  Result Value Ref Range   Color, Urine YELLOW YELLOW   APPearance CLEAR CLEAR   Specific Gravity, Urine 1.015 1.005 - 1.030   pH 5.0 5.0 - 8.0   Glucose, UA NEGATIVE NEGATIVE mg/dL   Hgb urine dipstick NEGATIVE NEGATIVE   Bilirubin Urine NEGATIVE NEGATIVE   Ketones, ur 5 (A) NEGATIVE mg/dL   Protein, ur NEGATIVE NEGATIVE mg/dL   Nitrite NEGATIVE NEGATIVE   Leukocytes,Ua NEGATIVE NEGATIVE   RBC / HPF 0-5 0 - 5  RBC/hpf   WBC, UA 0-5 0 - 5 WBC/hpf   Bacteria, UA NONE SEEN NONE SEEN   Squamous Epithelial / LPF 6-10 0 - 5   Mucus PRESENT     Comment: Performed at [facility], [place] [address], [place], [place] [address]  Glucose, capillary     Status: Abnormal   Collection Time: 05/17/21  4:40 PM  Result Value Ref Range   Glucose-Capillary 208 (H) 70 - 99 mg/dL    Comment: Glucose reference range applies only to samples taken after fasting for at least 8 hours.   Comment 1 Notify RN   Glucose, capillary     Status: Abnormal   Collection Time: 05/17/21  8:31 PM  Result Value Ref Range   Glucose-Capillary 200 (H) 70 - 99 mg/dL    Comment: Glucose reference range applies only to samples taken after fasting for at least 8 hours.  Glucose, capillary     Status: Abnormal   Collection Time: 05/18/21  6:16 AM  Result Value Ref Range   Glucose-Capillary 164 (H) 70 - 99 mg/dL    Comment: Glucose reference range applies only to samples taken after fasting for at least 8 hours.   Comment 1 Notify RN    Comment 2 Document in Chart   Glucose, capillary     Status: Abnormal   Collection Time: 05/18/21 12:12 PM  Result Value Ref Range   Glucose-Capillary 163 (H) 70 - 99 mg/dL    Comment: Glucose reference range applies only to samples taken after fasting for at least 8 hours.  Glucose, capillary     Status: Abnormal   Collection Time: 05/18/21  5:35 PM  Result Value Ref Range   Glucose-Capillary 235 (H) 70 - 99 mg/dL    Comment: Glucose reference range applies only to samples taken after fasting for at least 8 hours.  Glucose, capillary     Status: Abnormal  Collection Time: 05/18/21  8:13 PM  Result Value Ref Range   Glucose-Capillary 170 (H) 70 - 99 mg/dL    Comment: Glucose reference range applies only to samples taken after fasting for at least 8 hours.  Glucose, capillary     Status: Abnormal   Collection Time: 05/19/21  6:36 AM  Result Value Ref Range   Glucose-Capillary 135  (H) 70 - 99 mg/dL    Comment: Glucose reference range applies only to samples taken after fasting for at least 8 hours.   Blood Alcohol level:  Lab Results  Component Value Date   ETH <10 05/12/2021   ETH <10 52/77/8242   Metabolic Disorder Labs: Lab Results  Component Value Date   HGBA1C 9.2 (H) 05/12/2021   MPG 217.34 05/12/2021   MPG 209 08/19/2020   Lab Results  Component Value Date   PROLACTIN 11.7 05/17/2018   PROLACTIN 105.5 (H) 02/28/2017   Lab Results  Component Value Date   CHOL 101 05/17/2021   TRIG 65 05/17/2021   HDL 32 (L) 05/17/2021   CHOLHDL 3.2 05/17/2021   VLDL 13 05/17/2021   LDLCALC 56 05/17/2021   LDLCALC 49 08/18/2020   Physical Findings: AIMS:  , ,  ,  ,    CIWA:    COWS:     Musculoskeletal: Strength & Muscle Tone: within normal limits Gait & Station: normal Patient leans: N/A  Psychiatric Specialty Exam:  Presentation  General Appearance: Casual; Fairly Groomed  Eye Contact:Fair  Speech:Clear and Coherent; Slow; Other (comment) (Some latency of speech noted.)  Speech Volume:Decreased  Handedness:Right  Mood and Affect  Mood:Dysphoric  Affect:Congruent; Tearful; Labile  Thought Process  Thought Processes:Disorganized  Descriptions of Associations:Tangential  Orientation:Partial  Thought Content:Delusions; Illogical; Paranoid Ideation; Tangential  History of Schizophrenia/Schizoaffective disorder:Yes  Duration of Psychotic Symptoms:Greater than six months  Hallucinations:Hallucinations: Other (comment) Description of Auditory Hallucinations: Responding to internal stimuli.  Ideas of Reference:Delusions; Paranoia  Suicidal Thoughts:Suicidal Thoughts: No SI Passive Intent and/or Plan: Without Intent; Without Plan; Without Means to Carry Out; Without Access to Means  Homicidal Thoughts:Homicidal Thoughts: No  Sensorium  Memory:Immediate Fair; Recent Fair; Remote  Poor  Judgment:Impaired  Insight:Poor  Executive Functions  Concentration:Fair  Attention Span:Fair  [place] of Knowledge:Poor  Language:Fair  Psychomotor Activity  Psychomotor Activity:Psychomotor Activity: Decreased  Assets  Assets:Desire for Improvement; Resilience  Sleep  Sleep:Sleep: Good Number of Hours of Sleep: 6.5  Physical Exam: Physical Exam Vitals and nursing note reviewed.  HENT:     Mouth/Throat:     Pharynx: Oropharynx is clear.  Eyes:     Pupils: Pupils are equal, round, and reactive to light.  Cardiovascular:     Pulses: Normal pulses.     Comments: Elevated b/p: 138/91.   Hx. HTN (essential). Patient is currently in no apparent distress. Pulmonary:     Effort: Pulmonary effort is normal.  Genitourinary:    Comments: Deferred Musculoskeletal:        General: Normal range of motion.     Cervical back: Normal range of motion.  Skin:    General: Skin is warm and dry.  Neurological:     Mental Status: She is alert and oriented to person, place, and time.     Comments: Oriented x 2 (self/situation).   Review of Systems  Constitutional:  Negative for chills, diaphoresis and fever.  HENT:  Negative for congestion and sore throat.   Eyes:  Negative for blurred vision.  Respiratory:  Negative for cough,  shortness of breath and wheezing.   Cardiovascular:  Negative for chest pain and palpitations.  Gastrointestinal:  Negative for abdominal pain, constipation, diarrhea, heartburn, nausea and vomiting.  Genitourinary:  Negative for dysuria.  Musculoskeletal:  Negative for joint pain and myalgias.  Skin: Negative.   Neurological:  Negative for dizziness, tingling, tremors, sensory change, speech change, focal weakness, seizures, loss of consciousness, weakness and headaches.  Psychiatric/Behavioral:  Positive for depression, hallucinations (Presents paranoid, delusional, responding to internal stimuli.) and substance abuse (Hx. THC use  disorder). Negative for memory loss and suicidal ideas. The patient is not nervous/anxious and does not have insomnia.   Blood pressure (!) 138/91, pulse (!) 107, temperature 98.2 F (36.8 C), temperature source Oral, resp. rate 16, height 5\' 11"  (1.803 m), weight 114.8 kg, SpO2 100 %. Body mass index is 35.29 kg/m.  Treatment Plan Summary: Daily contact with patient to assess and evaluate symptoms and progress in treatment and Medication management.   Continue inpatient hospitalization.  Will continue today 05/19/2021 plan as below except where it is noted.   Diagnoses:  Schizoaffective disorder, bipolar-type.  Cannabis use disorder.  Other medical issues:  Diabetes Mellitus.  HTN (essential).  Plan.  Schizoaffective disorder:  Continue Abilify 15 mg po daily. Increased Haldol from 5 mg po bid to Haldol 10 mg po bid (starting today 05-19-21 evening).  Increased Depakote from 250 mg po bid to Depakote 500 mg po bid (starting 05-19-21 evening).  Will obtain Depakote level on 05-24-21 am.  EPS prevention. Increased Cogentin from 0.5 mg to Cogentin 1 mg po bid (starting today 05-19-21 evening).   Anxiety.  Continue Vistaril 25 mg po tid prn. Initiated Propranolol 10 mg po bid for anxiety/elevated heart rate.  Insomnia.  Continue Trazodone 50 mg po Q hs prn.  Lab pending: CBC with diff, CMP, T3, T4, U/A.  Agitation/psychosis.  Continue Olanzapine 5 mg po or IM bid prn.  Other medical issues, continue:  Lipitor 80 mg po daily for high cholesterol.  Insulin glargine-yfgn 16 units subQ daily for DM. Metformin 1,000 mg po bid for diabetes mellitus. Lisinopril 5 mg po daily for hTN. Continue the sliding scale insulin as recommended per CBG results.  Encourage group attendance/participation. Discharge disposition plan in progress.  Lindell Spar, NP, pmhnp, fnp-bc 05/19/2021, 10:12 AM Patient ID: [name], female   DOB: Aug 18, 1975, 45 y.o.   MRN: [id number]

## 2021-05-19 NOTE — Progress Notes (Signed)
   05/18/21 2100  Psych Admission Type (Psych Patients Only)  Admission Status Involuntary  Psychosocial Assessment  Patient Complaints Agitation;Anxiety;Crying spells;Depression;Irritability;Isolation;Sadness  Eye Contact Staring  Facial Expression Wide-eyed  Affect Anxious  Speech Logical/coherent  Interaction Assertive  Motor Activity Slow  Appearance/Hygiene In scrubs  Behavior Characteristics Anxious;Irritable  Mood Anxious;Depressed;Suspicious  Thought Process  Coherency WDL  Content Preoccupation  Delusions Paranoid  Perception WDL  Hallucination None reported or observed  Judgment Limited  Confusion None  Danger to Self  Current suicidal ideation? Denies  Danger to Others  Danger to Others None reported or observed

## 2021-05-19 NOTE — [facility] Suicide Risk Assessment (Signed)
[place] INPATIENT:  Family/Significant Other Suicide Prevention Education  Suicide Prevention Education:  Education Completed; Danne Baxter, [phone] (Mother)   (name of family member/significant other) has been identified by the patient as the family member/significant other with whom the patient will be residing, and identified as the person(s) who will aid the patient in the event of a mental health crisis (suicidal ideations/suicide attempt).  With written consent from the patient, the family member/significant other has been provided the following suicide prevention education, prior to the and/or following the discharge of the patient.  CSW spoke with patient mother, Corky Sing.  Mother reports that patient was manic and paranoid prior to hospitalization.  She recently was evicted and was living with her sister, however that was a strained relationship and patient called the police on her claiming she was kidnapped. At this time patient can not return to her sister.  Patient mother lives three hours away and mom reports that patient will need to find shelter when she leaves the hospital.  At this time patient is homeless. Mother reports that she does not have access to guns/weapons. Mom reports her safety concerns are more around patient paranoia.  Mother feels like she needs assistance applying  for disability and getting shelter/low income housing.    The suicide prevention education provided includes the following: Suicide risk factors Suicide prevention and interventions National Suicide Hotline telephone number [facility] assessment telephone number [facility] Emergency Assistance [place] and/or Residential Mobile Crisis Unit telephone number  Request made of family/significant other to: Remove weapons (e.g., guns, rifles, knives), all items previously/currently identified as safety concern.   Remove drugs/medications (over-the-counter, prescriptions,  illicit drugs), all items previously/currently identified as a safety concern.  The family member/significant other verbalizes understanding of the suicide prevention education information provided.  The family member/significant other agrees to remove the items of safety concern listed above.  Stark Aguinaga E Ho Parisi 05/19/2021, 2:20 PM
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
## 2021-05-19 NOTE — [facility] Group Notes (Signed)
Pt didn't attend group. 

## 2021-05-19 NOTE — Group Note (Signed)
Occupational Therapy Group Note  Group Topic:Self-Esteem  Group Date: 05/19/2021 Start Time: 1400 End Time: 1435 Facilitators: Ponciano Ort, OT/L   Group Description: Group encouraged increased engagement and participation through discussion and activity focused on self-esteem. Patients explored and discussed the differences between healthy and low self-esteem and how it affects our daily lives and occupations with a focus on relationships, work, school, self-care, and personal leisure interests. Group discussion then transitioned into identifying specific strategies to boost self-esteem and engaged in a collaborative and independent activity looking at positive ways to describe oneself A-Z.   Therapeutic Goal(s): Understand and recognize the differences between healthy and low self-esteem Identify healthy strategies to improve/build self-esteem    Participation Level: Did not attend   Plan: Continue to engage patient in OT groups 2 - 3x/week.  05/19/2021  Ponciano Ort, OT/L

## 2021-05-20 LAB — CBC WITH DIFFERENTIAL/PLATELET
Abs Immature Granulocytes: 0.02 10*3/uL (ref 0.00–0.07)
Basophils Absolute: 0.1 10*3/uL (ref 0.0–0.1)
Basophils Relative: 1 %
Eosinophils Absolute: 0.3 10*3/uL (ref 0.0–0.5)
Eosinophils Relative: 4 %
HCT: 35.2 % — ABNORMAL LOW (ref 36.0–46.0)
Hemoglobin: 10.5 g/dL — ABNORMAL LOW (ref 12.0–15.0)
Immature Granulocytes: 0 %
Lymphocytes Relative: 27 %
Lymphs Abs: 2.3 10*3/uL (ref 0.7–4.0)
MCH: 24.1 pg — ABNORMAL LOW (ref 26.0–34.0)
MCHC: 29.8 g/dL — ABNORMAL LOW (ref 30.0–36.0)
MCV: 80.7 fL (ref 80.0–100.0)
Monocytes Absolute: 0.7 10*3/uL (ref 0.1–1.0)
Monocytes Relative: 8 %
Neutro Abs: 5.1 10*3/uL (ref 1.7–7.7)
Neutrophils Relative %: 60 %
Platelets: 418 10*3/uL — ABNORMAL HIGH (ref 150–400)
RBC: 4.36 MIL/uL (ref 3.87–5.11)
RDW: 16.8 % — ABNORMAL HIGH (ref 11.5–15.5)
WBC: 8.4 10*3/uL (ref 4.0–10.5)
nRBC: 0 % (ref 0.0–0.2)

## 2021-05-20 LAB — T4, FREE: Free T4: 1.16 ng/dL — ABNORMAL HIGH (ref 0.61–1.12)

## 2021-05-20 LAB — COMPREHENSIVE METABOLIC PANEL
ALT: 22 U/L (ref 0–44)
AST: 19 U/L (ref 15–41)
Albumin: 3.4 g/dL — ABNORMAL LOW (ref 3.5–5.0)
Alkaline Phosphatase: 70 U/L (ref 38–126)
Anion gap: 7 (ref 5–15)
BUN: 9 mg/dL (ref 6–20)
CO2: 24 mmol/L (ref 22–32)
Calcium: 9.2 mg/dL (ref 8.9–10.3)
Chloride: 106 mmol/L (ref 98–111)
Creatinine, Ser: 0.69 mg/dL (ref 0.44–1.00)
GFR, Estimated: >60 mL/min (ref >60)
Glucose, Bld: 201 mg/dL — ABNORMAL HIGH (ref 70–99)
Potassium: 4.1 mmol/L (ref 3.5–5.1)
Sodium: 137 mmol/L (ref 135–145)
Total Bilirubin: 0.6 mg/dL (ref 0.3–1.2)
Total Protein: 6.9 g/dL (ref 6.5–8.1)

## 2021-05-20 LAB — URINALYSIS, COMPLETE (UACMP) WITH MICROSCOPIC
Bilirubin Urine: NEGATIVE
Glucose, UA: 150 mg/dL — AB
Ketones, ur: 5 mg/dL — AB
Nitrite: NEGATIVE
Protein, ur: NEGATIVE mg/dL
Specific Gravity, Urine: 1.02 (ref 1.005–1.030)
pH: 5 (ref 5.0–8.0)

## 2021-05-20 LAB — AMMONIA: Ammonia: 35 umol/L (ref 9–35)

## 2021-05-20 LAB — GLUCOSE, CAPILLARY
Glucose-Capillary: 145 mg/dL — ABNORMAL HIGH (ref 70–99)
Glucose-Capillary: 200 mg/dL — ABNORMAL HIGH (ref 70–99)
Glucose-Capillary: 121 mg/dL — ABNORMAL HIGH (ref 70–99)
Glucose-Capillary: 171 mg/dL — ABNORMAL HIGH (ref 70–99)

## 2021-05-20 MED ORDER — ACETAMINOPHEN 325 MG PO TABS
650.0000 mg | ORAL_TABLET | ORAL | Status: DC | PRN
  Administered 2021-05-20 – 2021-05-29 (×3): 650 mg via ORAL
  Filled 2021-05-20 (×4): qty 2

## 2021-05-20 MED ORDER — SULFAMETHOXAZOLE-TRIMETHOPRIM 800-160 MG PO TABS
1.0000 | ORAL_TABLET | Freq: Two times a day (BID) | ORAL | Status: AC
  Administered 2021-05-20 – 2021-05-26 (×14): 1 via ORAL
  Filled 2021-05-20 (×15): qty 1

## 2021-05-20 MED ORDER — FERROUS SULFATE 325 (65 FE) MG PO TABS
325.0000 mg | ORAL_TABLET | Freq: Two times a day (BID) | ORAL | Status: DC
  Administered 2021-05-20 – 2021-06-01 (×24): 325 mg via ORAL
  Filled 2021-05-20 (×26): qty 1

## 2021-05-20 MED ORDER — DOCUSATE SODIUM 100 MG PO CAPS
100.0000 mg | ORAL_CAPSULE | Freq: Every day | ORAL | Status: DC
  Administered 2021-05-20 – 2021-06-01 (×13): 100 mg via ORAL
  Filled 2021-05-20 (×14): qty 1

## 2021-05-20 NOTE — Group Note (Signed)
Recreation Therapy Group Note   Group Topic:Stress Management  Group Date: 05/20/2021 Start Time: 1000 End Time: 7543 Facilitators: Victorino Sparrow, LRT,CTRS Location: [address] Dayroom   Goal Area(s) Addresses:  Patient will review and complete packet supporting identification of stressors and and techniques to combat compounding stress.    Clinical Observations/Individualized Feedback: Due to COVID restrictions on unit, patients were given a stress management packet.  The packet addressed symptoms of stress, triggers for stress, identifying what you can/can't control that leads to stress and techniques to manage your time to reduce stress.     Plan: Continue to engage patient in RT group sessions 2-3x/week.   Victorino Sparrow, LRT,CTRS  05/20/2021 11:19 AM

## 2021-05-20 NOTE — Progress Notes (Signed)
[place]/Goals group wasn't held due to covid precautions that are in place.

## 2021-05-20 NOTE — Progress Notes (Signed)
Pt visible in the dayroom some this evening, pt given PRN Vistaril, Trazodone and Zyprexa per [facility] with HS medication , pt appeared less confused this evening, but continues to have issues making decisions    05/20/21 2100  Psych Admission Type (Psych Patients Only)  Admission Status Involuntary  Psychosocial Assessment  Patient Complaints Depression;Crying spells  Eye Contact Staring  Facial Expression Wide-eyed  Affect Anxious  Speech Logical/coherent  Interaction Assertive  Motor Activity Slow  Appearance/Hygiene In scrubs  Behavior Characteristics Cooperative  Mood Preoccupied  Thought Process  Coherency WDL  Content Preoccupation  Delusions Paranoid  Perception WDL  Hallucination None reported or observed  Judgment Limited  Confusion None  Danger to Self  Current suicidal ideation? Denies  Danger to Others  Danger to Others None reported or observed

## 2021-05-20 NOTE — Progress Notes (Signed)
Pt was encouraged to go to therapeutic relaxation group but didn't attend.

## 2021-05-20 NOTE — Progress Notes (Signed)
[facility] MD Progress Note  05/20/2021 2:39 PM [name]  MRN:  [id number]  Subjective: [name] cheerfully reports today, "I feel good on the inside of my body today. But, my mind is racing, thinking about what I should be doing, how I should be doing it & when I should be doing it. But I know that is not what I should be thinking about. I'm taking my medicines. I think I'm feeling better. I know when I'm better, I'm productive. I just feel better today, seeing things for what they are". Daily notes: [name] is seen in her room. Chart reviewed. The chart findings discussed with the treatment team. She is sitting on her bed. She presents alert, cheerful & making a good eye contact. She presents with a bright & reactive affect, laughing & having what seem like a concrete conversation. She reports her mind as racing because she was thinking about stuff that she should not be thinking or worried about. She says she is taking her medications & she feels she is doing/feeling better. She denies any side effects. She says she slept well last night. We had increased as of yesterday, her Haldol from 5 mg bid to Haldol 10 mg po bid for her mood symptoms & Cogentin from 0.5 mg bid to 1 mg po bid to prevent eps. Her Depakote was increased as well to 500 mg po bid for mood stabilization. Will obtain valproic level on 05-24-21 am. She is also started on Propranolol 10 mg bid for elevated heart rate/anxiety. We obtained a U/A for complain of urinary symptoms. The result indicated presence of blood & some leukocytes, moderate. We started her on Bactrim DS bid x 7 days. She is encouraged to drink plenty fluid during the day. There are other labs pending collection. We will continue current plan of care as already in progress. We will continue to treat her symptoms, evaluate her response to treatments on daily basis to make adequate treatment decision for her symptoms prior to discharge. Reviewed vital signs, pulse rate (wnl), blood pressure  threading down. Patient is currently in no apparent distress.  Reason for admission: 45 year old female with a reported psychiatric history of "schizoaffective, bipolar, and personality" who was admitted to this psychiatric unit for disorientation, confusion, hallucinations, suicidal thoughts, and delusions. During the her admission interview, about details of her HPI.  She reports that she was driving from [place], to [place], realized that she was not thinking clearly and confused, and went to the [facility] for evaluation.    Principal Problem: Schizoaffective disorder, bipolar type ([place])  Diagnosis: Principal Problem:   Schizoaffective disorder, bipolar type ([place]) Active Problems:   Cannabis use disorder, moderate, dependence ([place])  Total Time spent with patient:  25 minutes  Past Psychiatric History: Schizoaffective disorder, bipolar-type.  Past Medical History:  Past Medical History:  Diagnosis Date   Abnormal Pap smear    Bipolar 1 disorder ([place])    Diabetes mellitus without complication (HCC)    Fibroids    Hypertension    IBS (irritable bowel syndrome)     Past Surgical History:  Procedure Laterality Date   CERVICAL BIOPSY     CHOLECYSTECTOMY     ESSURE TUBAL LIGATION     HIATAL HERNIA REPAIR     TUBAL LIGATION     Family History:  Family History  Problem Relation Age of Onset   Diabetes Father    Diabetes Paternal Grandmother    Diabetes Maternal Grandmother  Mental illness Cousin    Healthy Mother    Family Psychiatric  History: See H&P.  Social History:  Social History   Substance and Sexual Activity  Alcohol Use Not Currently   Comment: occasional     Social History   Substance and Sexual Activity  Drug Use Not Currently    Social History   Socioeconomic History   Marital status: Single    Spouse name: Not on file   Number of children: 2   Years of education: Not on file   Highest education level: Master's degree (e.g.,  MA, MS, MEng, MEd, MSW, MBA)  Occupational History   Not on file  Tobacco Use   Smoking status: Former    Types: Cigarettes   Smokeless tobacco: Never  Vaping Use   Vaping Use: Some days   Substances: Nicotine, Flavoring  Substance and Sexual Activity   Alcohol use: Not Currently    Comment: occasional   Drug use: Not Currently   Sexual activity: Yes    Birth control/protection: Surgical  Other Topics Concern   Not on file  Social History Narrative   ** Merged History Encounter **       Social Determinants of Health   Financial Resource Strain: Not on file  Food Insecurity: Not on file  Transportation Needs: Not on file  Physical Activity: Not on file  Stress: Not on file  Social Connections: Not on file   Additional Social History:   Sleep:  6.5  Appetite:  Good  Current Medications: Current Facility-Administered Medications  Medication Dose Route Frequency Provider Last Rate Last Admin   ARIPiprazole (ABILIFY) tablet 15 mg  15 mg Oral Daily Massengill, Nathan, MD   15 mg at 05/20/21 0718   atorvastatin (LIPITOR) tablet 80 mg  80 mg Oral Daily Rankin, Shuvon B, NP   80 mg at 05/20/21 0718   benztropine (COGENTIN) tablet 1 mg  1 mg Oral BID Lindell Spar I, NP   1 mg at 05/20/21 0717   divalproex (DEPAKOTE) DR tablet 500 mg  500 mg Oral BID Lindell Spar I, NP   500 mg at 05/20/21 6606   docusate sodium (COLACE) capsule 100 mg  100 mg Oral Daily Lindell Spar I, NP   100 mg at 05/20/21 1216   ferrous sulfate tablet 325 mg  325 mg Oral BID WC Lorry Anastasi I, NP       haloperidol (HALDOL) tablet 10 mg  10 mg Oral BID Massengill, Ovid Curd, MD   10 mg at 05/20/21 0719   hydrOXYzine (ATARAX/VISTARIL) tablet 25 mg  25 mg Oral TID PRN Prescilla Sours, PA-C   25 mg at 05/20/21 0401   insulin aspart (novoLOG) injection 0-5 Units  0-5 Units Subcutaneous QHS Prescilla Sours, PA-C   3 Units at 05/16/21 2254   insulin aspart (novoLOG) injection 0-9 Units  0-9 Units Subcutaneous TID WC  Rankin, Shuvon B, NP   2 Units at 05/20/21 1216   insulin glargine-yfgn (SEMGLEE) injection 16 Units  16 Units Subcutaneous Daily Massengill, Ovid Curd, MD   16 Units at 05/20/21 0720   lisinopril (ZESTRIL) tablet 5 mg  5 mg Oral Daily Rankin, Shuvon B, NP   5 mg at 05/20/21 0718   metFORMIN (GLUCOPHAGE) tablet 1,000 mg  1,000 mg Oral BID WC Rankin, Shuvon B, NP   1,000 mg at 05/20/21 0718   OLANZapine (ZYPREXA) injection 5 mg  5 mg Intramuscular BID PRN Janine Limbo, MD  OLANZapine (ZYPREXA) tablet 5 mg  5 mg Oral BID PRN Massengill, Ovid Curd, MD   5 mg at 05/19/21 2020   propranolol (INDERAL) tablet 10 mg  10 mg Oral BID Massengill, Ovid Curd, MD   10 mg at 05/20/21 0865   sulfamethoxazole-trimethoprim (BACTRIM DS) 800-160 MG per tablet 1 tablet  1 tablet Oral Q12H Lindell Spar I, NP   1 tablet at 05/20/21 1216   traZODone (DESYREL) tablet 50 mg  50 mg Oral QHS PRN Prescilla Sours, PA-C   50 mg at 05/19/21 2020   Lab Results:  Results for orders placed or performed during the hospital encounter of 05/16/21 (from the past 48 hour(s))  Glucose, capillary     Status: Abnormal   Collection Time: 05/18/21  5:35 PM  Result Value Ref Range   Glucose-Capillary 235 (H) 70 - 99 mg/dL    Comment: Glucose reference range applies only to samples taken after fasting for at least 8 hours.  Glucose, capillary     Status: Abnormal   Collection Time: 05/18/21  8:13 PM  Result Value Ref Range   Glucose-Capillary 170 (H) 70 - 99 mg/dL    Comment: Glucose reference range applies only to samples taken after fasting for at least 8 hours.  Glucose, capillary     Status: Abnormal   Collection Time: 05/19/21  6:36 AM  Result Value Ref Range   Glucose-Capillary 135 (H) 70 - 99 mg/dL    Comment: Glucose reference range applies only to samples taken after fasting for at least 8 hours.  Glucose, capillary     Status: Abnormal   Collection Time: 05/19/21 11:42 AM  Result Value Ref Range   Glucose-Capillary 256  (H) 70 - 99 mg/dL    Comment: Glucose reference range applies only to samples taken after fasting for at least 8 hours.  Glucose, capillary     Status: Abnormal   Collection Time: 05/19/21  4:39 PM  Result Value Ref Range   Glucose-Capillary 212 (H) 70 - 99 mg/dL    Comment: Glucose reference range applies only to samples taken after fasting for at least 8 hours.  Glucose, capillary     Status: Abnormal   Collection Time: 05/19/21  8:30 PM  Result Value Ref Range   Glucose-Capillary 146 (H) 70 - 99 mg/dL    Comment: Glucose reference range applies only to samples taken after fasting for at least 8 hours.  Glucose, capillary     Status: Abnormal   Collection Time: 05/20/21  5:26 AM  Result Value Ref Range   Glucose-Capillary 145 (H) 70 - 99 mg/dL    Comment: Glucose reference range applies only to samples taken after fasting for at least 8 hours.   Comment 1 Notify RN   CBC with Differential/Platelet     Status: Abnormal   Collection Time: 05/20/21  6:24 AM  Result Value Ref Range   WBC 8.4 4.0 - 10.5 K/uL   RBC 4.36 3.87 - 5.11 MIL/uL   Hemoglobin 10.5 (L) 12.0 - 15.0 g/dL   HCT 35.2 (L) 36.0 - 46.0 %   MCV 80.7 80.0 - 100.0 fL   MCH 24.1 (L) 26.0 - 34.0 pg   MCHC 29.8 (L) 30.0 - 36.0 g/dL   RDW 16.8 (H) 11.5 - 15.5 %   Platelets 418 (H) 150 - 400 K/uL   nRBC 0.0 0.0 - 0.2 %   Neutrophils Relative % 60 %   Neutro Abs 5.1 1.7 - 7.7 K/uL  Lymphocytes Relative 27 %   Lymphs Abs 2.3 0.7 - 4.0 K/uL   Monocytes Relative 8 %   Monocytes Absolute 0.7 0.1 - 1.0 K/uL   Eosinophils Relative 4 %   Eosinophils Absolute 0.3 0.0 - 0.5 K/uL   Basophils Relative 1 %   Basophils Absolute 0.1 0.0 - 0.1 K/uL   Immature Granulocytes 0 %   Abs Immature Granulocytes 0.02 0.00 - 0.07 K/uL    Comment: Performed at [facility], [place] [address], [place], [place] [address]  T4, free     Status: Abnormal   Collection Time: 05/20/21  6:24 AM  Result Value Ref Range   Free T4 1.16  (H) 0.61 - 1.12 ng/dL    Comment: (NOTE) Biotin ingestion may interfere with free T4 tests. If the results are inconsistent with the TSH level, previous test results, or the clinical presentation, then consider biotin interference. If needed, order repeat testing after stopping biotin. Performed at [facility], [place] [address] [place], [place] [address]   Comprehensive metabolic panel     Status: Abnormal   Collection Time: 05/20/21  6:24 AM  Result Value Ref Range   Sodium 137 135 - 145 mmol/L   Potassium 4.1 3.5 - 5.1 mmol/L   Chloride 106 98 - 111 mmol/L   CO2 24 22 - 32 mmol/L   Glucose, Bld 201 (H) 70 - 99 mg/dL    Comment: Glucose reference range applies only to samples taken after fasting for at least 8 hours.   BUN 9 6 - 20 mg/dL   Creatinine, Ser 0.69 0.44 - 1.00 mg/dL   Calcium 9.2 8.9 - 10.3 mg/dL   Total Protein 6.9 6.5 - 8.1 g/dL   Albumin 3.4 (L) 3.5 - 5.0 g/dL   AST 19 15 - 41 U/L   ALT 22 0 - 44 U/L   Alkaline Phosphatase 70 38 - 126 U/L   Total Bilirubin 0.6 0.3 - 1.2 mg/dL   GFR, Estimated >60 >60 mL/min    Comment: (NOTE) Calculated using the CKD-EPI Creatinine Equation (2021)    Anion gap 7 5 - 15    Comment: Performed at [facility], [place] [address] [place], [place] [address]  Ammonia     Status: None   Collection Time: 05/20/21  6:24 AM  Result Value Ref Range   Ammonia 35 9 - 35 umol/L    Comment: Performed at [facility], [place] [address], [place], [place] [address]  Urinalysis, Complete w Microscopic Urine, Clean Catch     Status: Abnormal   Collection Time: 05/20/21  7:12 AM  Result Value Ref Range   Color, Urine YELLOW YELLOW   APPearance CLOUDY (A) CLEAR   Specific Gravity, Urine 1.020 1.005 - 1.030   pH 5.0 5.0 - 8.0   Glucose, UA 150 (A) NEGATIVE mg/dL   Hgb urine dipstick SMALL (A) NEGATIVE   Bilirubin Urine NEGATIVE NEGATIVE   Ketones, ur 5 (A) NEGATIVE mg/dL   Protein, ur NEGATIVE NEGATIVE  mg/dL   Nitrite NEGATIVE NEGATIVE   Leukocytes,Ua MODERATE (A) NEGATIVE   RBC / HPF 0-5 0 - 5 RBC/hpf   WBC, UA 6-10 0 - 5 WBC/hpf   Bacteria, UA RARE (A) NONE SEEN   Squamous Epithelial / LPF 21-50 0 - 5   Mucus PRESENT    Budding Yeast PRESENT    Hyaline Casts, UA PRESENT     Comment: Performed at [facility],  [place] [address] [place], [place] [address]  Glucose, capillary     Status: Abnormal   Collection Time: 05/20/21 12:01 PM  Result Value Ref Range   Glucose-Capillary 200 (H) 70 - 99 mg/dL    Comment: Glucose reference range applies only to samples taken after fasting for at least 8 hours.   Blood Alcohol level:  Lab Results  Component Value Date   ETH <10 05/12/2021   ETH <10 56/31/4970   Metabolic Disorder Labs: Lab Results  Component Value Date   HGBA1C 9.2 (H) 05/12/2021   MPG 217.34 05/12/2021   MPG 209 08/19/2020   Lab Results  Component Value Date   PROLACTIN 11.7 05/17/2018   PROLACTIN 105.5 (H) 02/28/2017   Lab Results  Component Value Date   CHOL 101 05/17/2021   TRIG 65 05/17/2021   HDL 32 (L) 05/17/2021   CHOLHDL 3.2 05/17/2021   VLDL 13 05/17/2021   LDLCALC 56 05/17/2021   LDLCALC 49 08/18/2020   Physical Findings: AIMS:  , ,  ,  ,    CIWA:    COWS:     Musculoskeletal: Strength & Muscle Tone: within normal limits Gait & Station: normal Patient leans: N/A  Psychiatric Specialty Exam:  Presentation  General Appearance: Appropriate for Environment; Casual; Fairly Groomed  Eye Contact:Good  Speech:Clear and Coherent; Normal Rate  Speech Volume:Normal  Handedness:Right  Mood and Affect  Mood:-- ("Improving, hopeful")  Affect:Appropriate; Congruent  Thought Process  Thought Processes:Coherent; Goal Directed  Descriptions of Associations:Intact  Orientation:Full (Time, Place and Person)  Thought Content:Logical  History of Schizophrenia/Schizoaffective disorder:Yes  Duration of Psychotic  Symptoms:Greater than six months  Hallucinations:Hallucinations: None Description of Auditory Hallucinations: NA  Ideas of Reference:None  Suicidal Thoughts:Suicidal Thoughts: No SI Passive Intent and/or Plan: Without Intent; Without Plan; Without Means to Carry Out; Without Access to Means  Homicidal Thoughts:Homicidal Thoughts: No  Sensorium  Memory:Immediate Fair; Recent Fair; Remote Fair  Judgment:Fair  Insight:Fair  Executive Functions  Concentration:Fair  Attention Span:Fair  [place]  Language:Good  Psychomotor Activity  Psychomotor Activity:Psychomotor Activity: Normal  Assets  Assets:Communication Skills; Desire for Improvement; Financial Resources/Insurance; Housing; Social Support; Resilience  Sleep  Sleep:Sleep: Fair Number of Hours of Sleep: 5.5  Physical Exam: Physical Exam Vitals and nursing note reviewed.  HENT:     Mouth/Throat:     Pharynx: Oropharynx is clear.  Eyes:     Pupils: Pupils are equal, round, and reactive to light.  Cardiovascular:     Pulses: Normal pulses.     Comments: Elevated b/p: 138/91.   Hx. HTN (essential). Patient is currently in no apparent distress. Pulmonary:     Effort: Pulmonary effort is normal.  Genitourinary:    Comments: Deferred Musculoskeletal:        General: Normal range of motion.     Cervical back: Normal range of motion.  Skin:    General: Skin is warm and dry.  Neurological:     Mental Status: She is alert and oriented to person, place, and time.     Comments: Oriented x 2 (self/situation).   Review of Systems  Constitutional:  Negative for chills, diaphoresis and fever.  HENT:  Negative for congestion and sore throat.   Eyes:  Negative for blurred vision.  Respiratory:  Negative for cough, shortness of breath and wheezing.   Cardiovascular:  Negative for chest pain and palpitations.  Gastrointestinal:  Negative for abdominal pain, constipation, diarrhea,  heartburn, nausea and vomiting.  Genitourinary:  Negative for dysuria.  Musculoskeletal:  Negative for joint pain and myalgias.  Skin: Negative.   Neurological:  Negative for dizziness, tingling, tremors, sensory change, speech change, focal weakness, seizures, loss of consciousness, weakness and headaches.  Psychiatric/Behavioral:  Positive for depression, hallucinations (Presents paranoid, delusional, responding to internal stimuli.) and substance abuse (Hx. THC use disorder). Negative for memory loss and suicidal ideas. The patient is not nervous/anxious and does not have insomnia.   Blood pressure (!) 139/93, pulse 95, temperature 97.7 F (36.5 C), temperature source Oral, resp. rate 16, height 5\' 11"  (1.803 m), weight 114.8 kg, SpO2 100 %. Body mass index is 35.29 kg/m.  Treatment Plan Summary: Daily contact with patient to assess and evaluate symptoms and progress in treatment and Medication management.   Continue inpatient hospitalization.  Will continue today 05/20/2021 plan as below except where it is noted.   Diagnoses:  Schizoaffective disorder, bipolar-type.  Cannabis use disorder.  Other medical issues:  Diabetes Mellitus.  HTN (essential).  Plan.  Schizoaffective disorder:  Continue Abilify 15 mg po daily. Continue Haldol 10 mg po bid.  Continue Depakote 500 mg.  Will obtain Depakote level on 05-24-21 am.  EPS prevention. Continue Cogentin 1 mg po bid.  Anxiety.  Continue Vistaril 25 mg po tid prn. Continue Propranolol 10 mg po bid for anxiety/elevated heart rate.  Insomnia.  Continue Trazodone 50 mg po Q hs prn.  Lab pending: CBC with diff, CMP, T3, T4,  U/A result: (+) for blood, leukocytes- moderate. Started on Bactrim Ds 800-160 mg po bid x 7 days.  Agitation/psychosis.  Continue Olanzapine 5 mg po or IM bid prn.  Other medical issues, continue:  Lipitor 80 mg po daily for high cholesterol.  Insulin glargine-yfgn 16 units subQ daily for  DM. Metformin 1,000 mg po bid for diabetes mellitus. Lisinopril 5 mg po daily for hTN. Continue the sliding scale insulin as recommended per CBG results.  Encourage group attendance/participation. Discharge disposition plan in progress.  Lindell Spar, NP, pmhnp, fnp-bc 05/20/2021, 2:39 PM Patient ID: [name], female   DOB: [birth year], 45 y.o.   MRN: [id number] Patient ID: [name], female   DOB: May 06, 1976, 45 y.o.   MRN: [id number]

## 2021-05-20 NOTE — Progress Notes (Signed)
Patient anxious with crying spell at HS Prn Zyprexa, vistaril and trazodone given at 2020. Patient able to sleep. Woke up at 4 am crying inconsolable. Prn vistaril 25 mg given.  Scheduled medications administered per Provider order. Support and encouragement provided. Routine safety checks conducted every 15 minutes. Patient notified to inform staff with problems or concerns. No adverse drug reactions noted. Patient contracts for safety at this time. Will continue to monitor.

## 2021-05-20 NOTE — Group Note (Signed)
LCSW Group Therapy Note  05/20/2021    11am-12pm               Type of Therapy and Topic:  Group Therapy: Understanding the differences between fear and anxiety / Fear Ladder & Examining the Evidence  Participation Level:  Active   Description of Group:   In this group session, patients learned how to define and recognize the similarities differences between fear and anxiety. Patients will explore reactions we have to fear and anxiety. Patients identified a fear or something they feel anxious about. Patients analyzed scenarios and discussed both the positive and negative aspects to them. Patients were asked to identify if it is a fear or anxiety as well.  Patients were asked to provide their own examples. Patients will learn what to do for both fear and anxiety. CSW provided tools such as breaking things up into smaller steps, challenging automatic negative thinking / questions to ask, fear ladder and how to use gradual exposure. Patients will be asked to practice each tool with situations and to complete a fear ladder for their personal gain.   Therapeutic Goals: Patients will learn the difference between fear versus anxiety and the definitions of both. Patients will utilize scenarios and be able to identify if this is an example of a fear or anxiety. Patients will learn tools to handle both their fears and anxieties as well as discuss breaking it into smaller steps and exposure.  Patients will be asked to provide examples of their own and discuss how things have both positive and negative aspects.  Patients were provided questions to ask to challenge automatic negative thinking for anxiety.  Patients were provided a sample form of a fear ladder and asked to complete one for a fear.   Summary of Patient Progress:  Due to COVID on the unit, CSW met with patient individually, patient expressed experiencing racing thoughts as a form of anxiety.  She reports that talking with family and people who  support her help with the anxiety. Patient participated in deep breathing exercises as a way to assist with anxiety. Patient provided education around the cycle of anxiety and how avoiding and fear of something can increase anxiety.  Therapeutic Modalities:   Cognitive Behavioral Therapy Motivational Interviewing  Brief Therapy  Zachery Conch, LCSW  05/20/2021 11:55 AM

## 2021-05-20 NOTE — B[facility] Group Notes (Signed)
Adult Psychoeducational Group Note  Date:  05/20/2021 Time:  8:14 PM  Group Topic/Focus:  Making Healthy Choices:   The focus of this group is to help patients identify negative/unhealthy choices they were using prior to admission and identify positive/healthier coping strategies to replace them upon discharge.  Participation Level:  Minimal  Participation Quality:  Drowsy  Affect:  Flat  Cognitive:  Disorganized  Insight: Lacking  Engagement in Group:  Limited  Modes of Intervention:  Discussion  Additional Comments  Dalene Carrow 05/20/2021, 8:14 PM

## 2021-05-20 NOTE — [facility] Group Notes (Signed)
  Spirituality group facilitated by Kathrynn Humble, [place].   Group Description: Group focused on topic of hope. Patients participated in facilitated discussion around topic, connecting with one another around experiences and definitions for hope. Group members engaged with visual explorer photos, reflecting on what hope looks like for them today. Group engaged in discussion around how their definitions of hope are present today in hospital.   Modalities: Psycho-social ed, Adlerian, Narrative, MI   Patient Progress: [name] participated in group and was actively engaged in the conversation.  At first she was hesitant to share and didn't seem to trust her answers, but as we talked more, she shared quite a bit about times when she felt she showed courage and what she needs courage for in the immediate future.  [place], [place] Pager, [phone] 9:44 PM

## 2021-05-20 NOTE — Progress Notes (Signed)
Pt was encouraged to go to psycho-ed group but didn't attend.

## 2021-05-21 LAB — GLUCOSE, CAPILLARY
Glucose-Capillary: 142 mg/dL — ABNORMAL HIGH (ref 70–99)
Glucose-Capillary: 159 mg/dL — ABNORMAL HIGH (ref 70–99)
Glucose-Capillary: 146 mg/dL — ABNORMAL HIGH (ref 70–99)
Glucose-Capillary: 151 mg/dL — ABNORMAL HIGH (ref 70–99)

## 2021-05-21 LAB — T3, FREE: T3, Free: 2.8 pg/mL (ref 2.0–4.4)

## 2021-05-21 MED ORDER — OLANZAPINE 10 MG IM SOLR: 10.0000 mg | Freq: Two times a day (BID) | INTRAMUSCULAR | Status: DC | PRN

## 2021-05-21 MED ORDER — OLANZAPINE 10 MG PO TABS
10.0000 mg | ORAL_TABLET | Freq: Two times a day (BID) | ORAL | Status: DC | PRN
  Administered 2021-05-21: 10 mg via ORAL
  Filled 2021-05-21: qty 1

## 2021-05-21 NOTE — B[facility] Group Notes (Signed)
.  Psychoeducational Group Note  Date 05/21/2021 Time: 0900-1000    Goal Setting   Purpose of Group: Group Focus: affirmation, clarity of thought, and goals/reality orientation Treatment Modality:  Psychoeducation Interventions utilized were assignment, group exercise, and support  Purpose: To be able to understand and verbalize the reason for their admission to the hospital. To understand that the medication helps with their chemical imbalance but they also need to work on their choices in life. To be challenged to develop a list of 30 positives about themselves. Also introduce the concept that "feelings" are not reality.    Pt walked in the group apprehensively and needed to be guided to a seat. Speech was so low that this writer had to get close to hear pt's response.  Pt's affect was fearful with wide eyes. [place] a few words, which was hard to understand. States that other patients are threatening her.    Additional Comments:   Paulino Rily

## 2021-05-21 NOTE — Group Note (Signed)
  [facility] LCSW Group Therapy Note  Date/Time:  05/21/2021 11:15AM-12:00PM  Type of Therapy and Topic:  Group Therapy:  Feelings About Hospitalization  Participation Level:  Minimal   Description of Group This process group involved patients discussing their feelings related to being hospitalized, as well as the benefits they see to being in the hospital.  These feelings and benefits were itemized.  The group then brainstormed specific ways in which they could seek those same benefits when they discharge and return home.  Therapeutic Goals Patient will identify and describe positive and negative feelings related to hospitalization Patient will verbalize benefits of hospitalization to themselves personally Patients will brainstorm together ways they can obtain similar benefits in the outpatient setting, identify barriers to wellness and possible solutions  Summary of Patient Progress:  The patient expressed her primary feelings about being hospitalized are "not so great" and "it's hard."  She had great difficult in speaking, then kept becoming more agitated, having what eventually looked similar to a panic attack.  She then in a panicked voice said "Circle, circle, circle, circle" numerous times.  She said "I'm so tired, so tired."  She said she had taken pills and needed to go to the hospital to get check out.  CSW reminded her she is in a hospital and that she has only had the pills given to her by her nurse.  She accepted this and calmed a little.  Another patient soothed her and successfully helped the patient to take deep breaths.  Staff was informed about her acute emotional difficulty and she was helped out of the room.  Therapeutic Modalities Cognitive Behavioral Therapy Motivational Interviewing    Selmer Dominion, LCSW 05/21/2021, 11:47 AM

## 2021-05-21 NOTE — [facility] Group Notes (Signed)
Pt didn't attend group. 

## 2021-05-21 NOTE — Progress Notes (Signed)
Pt noted with increased paranoia, irritability, confusion and thought blocking this shift. Received PRN Zyprexa mg PO twice  with Vistaril this shift as pt attempted to call [phone], standing at exit door, refusing to leave the area "Just take me outside, my family here to get me. Just take me to them" while crying. Paranoid about peers hearing about her medications and "They will come for me if you keep calling my medicines. Please take me out" as Probation officer was going over her scheduled evening medications. Required multiple verbal redirections this shift. Medications changes made by assigned provider this shift due to pt's current state. She denies SI, HI, AVH and pain when assessed. Remains preoccupied about staff her family being here to get her and needing the police for safety. Remains medication compliant without discomfort.  Q 15 minutes safety checks maintained without self harm gestures or outburst. Emotional support and reassurance offered to pt. Writer encouraged pt to voice concerns. Safety maintained on unit.  Tolerated meals and fluids well.

## 2021-05-21 NOTE — Progress Notes (Signed)
[facility] MD Progress Note  05/21/2021 4:26 PM [name]  MRN:  [id number]  Subjective: [name] reports today, "I'm okay. I'm just okay today". Daily notes: [name] is seen in her room. Chart reviewed. The chart findings discussed with the treatment team. She is sitting in the day room. She presents alert, but with a flat/worried affect. She is making a fair eye contact. She is displaying some thought blocking episodes, having difficulties completing a sentence. She says she is taking her medications & she feels she is doing okay on them.  She denies any side effects. She says she slept well last night. We recently increased her Haldol 10 mg po bid for her mood symptoms & Cogentin to 1 mg po bid to prevent eps. Her Depakote was increased as well to 500 mg po bid for mood stabilization. Will obtain valproic level on 05-24-21 am. She was also started on Propranolol 10 mg bid for elevated heart rate/anxiety. We obtained a U/A for complain of urinary symptoms. The result indicated presence of blood & some leukocytes, moderate. We started her on Bactrim DS bid x 7 days. She is encouraged to drink plenty fluid during the day. We will continue to treat her symptoms, evaluate her response to treatments on daily basis to make adequate treatment decision for her symptoms prior to discharge. Reviewed vital signs (stable). Patient is currently in no apparent distress.  Reason for admission: 45 year old female with a reported psychiatric history of "schizoaffective, bipolar, and personality" who was admitted to this psychiatric unit for disorientation, confusion, hallucinations, suicidal thoughts, and delusions. During the her admission interview, about details of her HPI.  She reports that she was driving from [place], to [place], realized that she was not thinking clearly and confused, and went to the [facility] for evaluation.    Principal Problem: Schizoaffective disorder, bipolar type ([place])  Diagnosis:  Principal Problem:   Schizoaffective disorder, bipolar type ([place]) Active Problems:   Cannabis use disorder, moderate, dependence ([place])  Total Time spent with patient:  25 minutes  Past Psychiatric History: Schizoaffective disorder, bipolar-type.  Past Medical History:  Past Medical History:  Diagnosis Date   Abnormal Pap smear    Bipolar 1 disorder ([place])    Diabetes mellitus without complication (HCC)    Fibroids    Hypertension    IBS (irritable bowel syndrome)     Past Surgical History:  Procedure Laterality Date   CERVICAL BIOPSY     CHOLECYSTECTOMY     ESSURE TUBAL LIGATION     HIATAL HERNIA REPAIR     TUBAL LIGATION     Family History:  Family History  Problem Relation Age of Onset   Diabetes Father    Diabetes Paternal Grandmother    Diabetes Maternal Grandmother    Mental illness Cousin    Healthy Mother    Family Psychiatric  History: See H&P.  Social History:  Social History   Substance and Sexual Activity  Alcohol Use Not Currently   Comment: occasional     Social History   Substance and Sexual Activity  Drug Use Not Currently    Social History   Socioeconomic History   Marital status: Single    Spouse name: Not on file   Number of children: 2   Years of education: Not on file   Highest education level: Master's degree (e.g., MA, MS, MEng, MEd, MSW, MBA)  Occupational History   Not on file  Tobacco Use   Smoking status: Former  Types: Cigarettes   Smokeless tobacco: Never  Vaping Use   Vaping Use: Some days   Substances: Nicotine, Flavoring  Substance and Sexual Activity   Alcohol use: Not Currently    Comment: occasional   Drug use: Not Currently   Sexual activity: Yes    Birth control/protection: Surgical  Other Topics Concern   Not on file  Social History Narrative   ** Merged History Encounter **       Social Determinants of Health   Financial Resource Strain: Not on file  Food Insecurity: Not on file  Transportation  Needs: Not on file  Physical Activity: Not on file  Stress: Not on file  Social Connections: Not on file   Additional Social History:   Sleep:  6.5  Appetite:  Good  Current Medications: Current Facility-Administered Medications  Medication Dose Route Frequency Provider Last Rate Last Admin   acetaminophen (TYLENOL) tablet 650 mg  650 mg Oral Q4H PRN Lindell Spar I, NP   650 mg at 05/20/21 1732   atorvastatin (LIPITOR) tablet 80 mg  80 mg Oral Daily Rankin, Shuvon B, NP   80 mg at 05/21/21 0801   benztropine (COGENTIN) tablet 1 mg  1 mg Oral BID Lindell Spar I, NP   1 mg at 05/21/21 0801   divalproex (DEPAKOTE) DR tablet 500 mg  500 mg Oral BID Lindell Spar I, NP   500 mg at 05/21/21 0801   docusate sodium (COLACE) capsule 100 mg  100 mg Oral Daily Ariauna Farabee, Herbert Pun I, NP   100 mg at 05/21/21 0801   ferrous sulfate tablet 325 mg  325 mg Oral BID WC Jazmine Heckman, Herbert Pun I, NP   325 mg at 05/21/21 0801   haloperidol (HALDOL) tablet 10 mg  10 mg Oral BID Massengill, Ovid Curd, MD   10 mg at 05/21/21 0802   hydrOXYzine (ATARAX/VISTARIL) tablet 25 mg  25 mg Oral TID PRN Prescilla Sours, PA-C   25 mg at 05/21/21 1008   insulin aspart (novoLOG) injection 0-5 Units  0-5 Units Subcutaneous QHS Prescilla Sours, PA-C   3 Units at 05/16/21 2254   insulin aspart (novoLOG) injection 0-9 Units  0-9 Units Subcutaneous TID WC Rankin, Shuvon B, NP   2 Units at 05/21/21 1231   insulin glargine-yfgn (SEMGLEE) injection 16 Units  16 Units Subcutaneous Daily Massengill, Ovid Curd, MD   16 Units at 05/21/21 0949   lisinopril (ZESTRIL) tablet 5 mg  5 mg Oral Daily Rankin, Shuvon B, NP   5 mg at 05/21/21 0802   metFORMIN (GLUCOPHAGE) tablet 1,000 mg  1,000 mg Oral BID WC Rankin, Shuvon B, NP   1,000 mg at 05/21/21 0801   OLANZapine (ZYPREXA) injection 10 mg  10 mg Intramuscular BID PRN Briant Cedar, MD       OLANZapine (ZYPREXA) tablet 10 mg  10 mg Oral BID PRN Briant Cedar, MD       propranolol (INDERAL) tablet 10  mg  10 mg Oral BID Massengill, Ovid Curd, MD   10 mg at 05/21/21 0802   sulfamethoxazole-trimethoprim (BACTRIM DS) 800-160 MG per tablet 1 tablet  1 tablet Oral Q12H Lindell Spar I, NP   1 tablet at 05/21/21 0802   traZODone (DESYREL) tablet 50 mg  50 mg Oral QHS PRN Prescilla Sours, PA-C   50 mg at 05/20/21 2045   Lab Results:  Results for orders placed or performed during the hospital encounter of 05/16/21 (from the past 48 hour(s))  Glucose, capillary  Status: Abnormal   Collection Time: 05/19/21  4:39 PM  Result Value Ref Range   Glucose-Capillary 212 (H) 70 - 99 mg/dL    Comment: Glucose reference range applies only to samples taken after fasting for at least 8 hours.  Glucose, capillary     Status: Abnormal   Collection Time: 05/19/21  8:30 PM  Result Value Ref Range   Glucose-Capillary 146 (H) 70 - 99 mg/dL    Comment: Glucose reference range applies only to samples taken after fasting for at least 8 hours.  Glucose, capillary     Status: Abnormal   Collection Time: 05/20/21  5:26 AM  Result Value Ref Range   Glucose-Capillary 145 (H) 70 - 99 mg/dL    Comment: Glucose reference range applies only to samples taken after fasting for at least 8 hours.   Comment 1 Notify RN   CBC with Differential/Platelet     Status: Abnormal   Collection Time: 05/20/21  6:24 AM  Result Value Ref Range   WBC 8.4 4.0 - 10.5 K/uL   RBC 4.36 3.87 - 5.11 MIL/uL   Hemoglobin 10.5 (L) 12.0 - 15.0 g/dL   HCT 35.2 (L) 36.0 - 46.0 %   MCV 80.7 80.0 - 100.0 fL   MCH 24.1 (L) 26.0 - 34.0 pg   MCHC 29.8 (L) 30.0 - 36.0 g/dL   RDW 16.8 (H) 11.5 - 15.5 %   Platelets 418 (H) 150 - 400 K/uL   nRBC 0.0 0.0 - 0.2 %   Neutrophils Relative % 60 %   Neutro Abs 5.1 1.7 - 7.7 K/uL   Lymphocytes Relative 27 %   Lymphs Abs 2.3 0.7 - 4.0 K/uL   Monocytes Relative 8 %   Monocytes Absolute 0.7 0.1 - 1.0 K/uL   Eosinophils Relative 4 %   Eosinophils Absolute 0.3 0.0 - 0.5 K/uL   Basophils Relative 1 %   Basophils  Absolute 0.1 0.0 - 0.1 K/uL   Immature Granulocytes 0 %   Abs Immature Granulocytes 0.02 0.00 - 0.07 K/uL    Comment: Performed at [facility], [place] [address] [place], [place] [address]  T3, free     Status: None   Collection Time: 05/20/21  6:24 AM  Result Value Ref Range   T3, Free 2.8 2.0 - 4.4 pg/mL    Comment: (NOTE) Performed At: [facility] [place], [place] [id number] Rush Farmer MD IH:[id number]   T4, free     Status: Abnormal   Collection Time: 05/20/21  6:24 AM  Result Value Ref Range   Free T4 1.16 (H) 0.61 - 1.12 ng/dL    Comment: (NOTE) Biotin ingestion may interfere with free T4 tests. If the results are inconsistent with the TSH level, previous test results, or the clinical presentation, then consider biotin interference. If needed, order repeat testing after stopping biotin. Performed at [facility], [place] [address] [place], [place] [address]   Comprehensive metabolic panel     Status: Abnormal   Collection Time: 05/20/21  6:24 AM  Result Value Ref Range   Sodium 137 135 - 145 mmol/L   Potassium 4.1 3.5 - 5.1 mmol/L   Chloride 106 98 - 111 mmol/L   CO2 24 22 - 32 mmol/L   Glucose, Bld 201 (H) 70 - 99 mg/dL    Comment: Glucose reference range applies only to samples taken after fasting for at least 8 hours.   BUN 9 6 - 20 mg/dL  Creatinine, Ser 0.69 0.44 - 1.00 mg/dL   Calcium 9.2 8.9 - 10.3 mg/dL   Total Protein 6.9 6.5 - 8.1 g/dL   Albumin 3.4 (L) 3.5 - 5.0 g/dL   AST 19 15 - 41 U/L   ALT 22 0 - 44 U/L   Alkaline Phosphatase 70 38 - 126 U/L   Total Bilirubin 0.6 0.3 - 1.2 mg/dL   GFR, Estimated >60 >60 mL/min    Comment: (NOTE) Calculated using the CKD-EPI Creatinine Equation (2021)    Anion gap 7 5 - 15    Comment: Performed at [facility], [place] [address] [place], [place] [address]  Ammonia     Status: None   Collection Time: 05/20/21  6:24 AM  Result Value Ref Range    Ammonia 35 9 - 35 umol/L    Comment: Performed at [facility], [place] [address], [place], [place] [address]  Urinalysis, Complete w Microscopic Urine, Clean Catch     Status: Abnormal   Collection Time: 05/20/21  7:12 AM  Result Value Ref Range   Color, Urine YELLOW YELLOW   APPearance CLOUDY (A) CLEAR   Specific Gravity, Urine 1.020 1.005 - 1.030   pH 5.0 5.0 - 8.0   Glucose, UA 150 (A) NEGATIVE mg/dL   Hgb urine dipstick SMALL (A) NEGATIVE   Bilirubin Urine NEGATIVE NEGATIVE   Ketones, ur 5 (A) NEGATIVE mg/dL   Protein, ur NEGATIVE NEGATIVE mg/dL   Nitrite NEGATIVE NEGATIVE   Leukocytes,Ua MODERATE (A) NEGATIVE   RBC / HPF 0-5 0 - 5 RBC/hpf   WBC, UA 6-10 0 - 5 WBC/hpf   Bacteria, UA RARE (A) NONE SEEN   Squamous Epithelial / LPF 21-50 0 - 5   Mucus PRESENT    Budding Yeast PRESENT    Hyaline Casts, UA PRESENT     Comment: Performed at [facility], [place] [address], [place], [place] [address]  Glucose, capillary     Status: Abnormal   Collection Time: 05/20/21 12:01 PM  Result Value Ref Range   Glucose-Capillary 200 (H) 70 - 99 mg/dL    Comment: Glucose reference range applies only to samples taken after fasting for at least 8 hours.  Glucose, capillary     Status: Abnormal   Collection Time: 05/20/21  5:27 PM  Result Value Ref Range   Glucose-Capillary 121 (H) 70 - 99 mg/dL    Comment: Glucose reference range applies only to samples taken after fasting for at least 8 hours.  Glucose, capillary     Status: Abnormal   Collection Time: 05/20/21  7:57 PM  Result Value Ref Range   Glucose-Capillary 171 (H) 70 - 99 mg/dL    Comment: Glucose reference range applies only to samples taken after fasting for at least 8 hours.  Glucose, capillary     Status: Abnormal   Collection Time: 05/21/21  6:08 AM  Result Value Ref Range   Glucose-Capillary 142 (H) 70 - 99 mg/dL    Comment: Glucose reference range applies only to samples taken after fasting  for at least 8 hours.  Glucose, capillary     Status: Abnormal   Collection Time: 05/21/21 12:08 PM  Result Value Ref Range   Glucose-Capillary 159 (H) 70 - 99 mg/dL    Comment: Glucose reference range applies only to samples taken after fasting for at least 8 hours.   Blood Alcohol level:  Lab Results  Component Value Date   ETH <10 05/12/2021  ETH <10 16/04/9603   Metabolic Disorder Labs: Lab Results  Component Value Date   HGBA1C 9.2 (H) 05/12/2021   MPG 217.34 05/12/2021   MPG 209 08/19/2020   Lab Results  Component Value Date   PROLACTIN 11.7 05/17/2018   PROLACTIN 105.5 (H) 02/28/2017   Lab Results  Component Value Date   CHOL 101 05/17/2021   TRIG 65 05/17/2021   HDL 32 (L) 05/17/2021   CHOLHDL 3.2 05/17/2021   VLDL 13 05/17/2021   LDLCALC 56 05/17/2021   LDLCALC 49 08/18/2020   Physical Findings: AIMS:  , ,  ,  ,    CIWA:    COWS:     Musculoskeletal: Strength & Muscle Tone: within normal limits Gait & Station: normal Patient leans: N/A  Psychiatric Specialty Exam:  Presentation  General Appearance: Appropriate for Environment; Casual; Fairly Groomed  Eye Contact:Good  Speech:Clear and Coherent; Normal Rate  Speech Volume:Normal  Handedness:Right  Mood and Affect  Mood:-- ("Improving, hopeful")  Affect:Appropriate; Congruent  Thought Process  Thought Processes:Coherent; Goal Directed  Descriptions of Associations:Intact  Orientation:Full (Time, Place and Person)  Thought Content:Logical  History of Schizophrenia/Schizoaffective disorder:Yes  Duration of Psychotic Symptoms:Greater than six months  Hallucinations:Hallucinations: None Description of Auditory Hallucinations: NA  Ideas of Reference:None  Suicidal Thoughts:Suicidal Thoughts: No SI Passive Intent and/or Plan: Without Intent; Without Plan; Without Means to Carry Out; Without Access to Means  Homicidal Thoughts:Homicidal Thoughts: No  Sensorium  Memory:Immediate  Fair; Recent Fair; Remote Fair  Judgment:Fair  Insight:Fair  Executive Functions  Concentration:Fair  Attention Span:Fair  [place]  Language:Good  Psychomotor Activity  Psychomotor Activity:Psychomotor Activity: Normal  Assets  Assets:Communication Skills; Desire for Improvement; Financial Resources/Insurance; Housing; Social Support; Resilience  Sleep  Sleep:Sleep: Good Number of Hours of Sleep: 7.5  Physical Exam: Physical Exam Vitals and nursing note reviewed.  HENT:     Mouth/Throat:     Pharynx: Oropharynx is clear.  Eyes:     Pupils: Pupils are equal, round, and reactive to light.  Cardiovascular:     Pulses: Normal pulses.     Comments: Elevated b/p: 138/91.   Hx. HTN (essential). Patient is currently in no apparent distress. Pulmonary:     Effort: Pulmonary effort is normal.  Genitourinary:    Comments: Deferred Musculoskeletal:        General: Normal range of motion.     Cervical back: Normal range of motion.  Skin:    General: Skin is warm and dry.  Neurological:     Mental Status: She is alert and oriented to person, place, and time.     Comments: Oriented x 2 (self/situation).   Review of Systems  Constitutional:  Negative for chills, diaphoresis and fever.  HENT:  Negative for congestion and sore throat.   Eyes:  Negative for blurred vision.  Respiratory:  Negative for cough, shortness of breath and wheezing.   Cardiovascular:  Negative for chest pain and palpitations.  Gastrointestinal:  Negative for abdominal pain, constipation, diarrhea, heartburn, nausea and vomiting.  Genitourinary:  Negative for dysuria.  Musculoskeletal:  Negative for joint pain and myalgias.  Skin: Negative.   Neurological:  Negative for dizziness, tingling, tremors, sensory change, speech change, focal weakness, seizures, loss of consciousness, weakness and headaches.  Psychiatric/Behavioral:  Positive for depression, hallucinations  (Presents paranoid, delusional, responding to internal stimuli.) and substance abuse (Hx. THC use disorder). Negative for memory loss and suicidal ideas. The patient is not nervous/anxious and does not have insomnia.  Blood pressure 124/80, pulse 90, temperature 97.6 F (36.4 C), temperature source Oral, resp. rate 16, height 5\' 11"  (1.803 m), weight 114.8 kg, SpO2 99 %. Body mass index is 35.29 kg/m.  Treatment Plan Summary: Daily contact with patient to assess and evaluate symptoms and progress in treatment and Medication management.   Continue inpatient hospitalization.  Will continue today 05/21/2021 plan as below except where it is noted.   Diagnoses:  Schizoaffective disorder, bipolar-type.  Cannabis use disorder.  Other medical issues:  Diabetes Mellitus.  HTN (essential).  Plan.  Schizoaffective disorder:  Continue Abilify 15 mg po daily. Continue Haldol 10 mg po bid.  Continue Depakote 500 mg.  Will obtain Depakote level on 05-24-21 am.  EPS prevention. Continue Cogentin 1 mg po bid.  Anxiety.  Continue Vistaril 25 mg po tid prn. Continue Propranolol 10 mg po bid for anxiety/elevated heart rate.  Insomnia.  Continue Trazodone 50 mg po Q hs prn.  Lab pending: CBC with diff, CMP, T3, T4,  U/A result: (+) for blood, leukocytes- moderate. Continue  Bactrim Ds 800-160 mg po bid x 7 days.  Agitation/psychosis.  Continue Olanzapine 10 mg po or IM bid prn.  Other medical issues, continue:  Lipitor 80 mg po daily for high cholesterol.  Insulin glargine-yfgn 16 units subQ daily for DM. Metformin 1,000 mg po bid for diabetes mellitus. Lisinopril 5 mg po daily for hTN. Continue Ferrous sulfate 325 mg po bid for anemia. Continue the sliding scale insulin as recommended per CBG results.  Encourage group attendance/participation. Discharge disposition plan in progress.  Lindell Spar, NP, pmhnp, fnp-bc 05/21/2021, 4:26 PM Patient ID: [name], female   DOB:  10/16/1975, 45 y.o.   MRN: [id number] Patient ID: [name], female   DOB: 12/08/1975, 45 y.o.   MRN: [id number] Patient ID: [name], female   DOB: 29-Dec-1975, 45 y.o.   MRN: [id number]

## 2021-05-21 NOTE — Progress Notes (Signed)
Pt was encouraged to go to orientation/goals group but didn't attend.

## 2021-05-21 NOTE — Progress Notes (Signed)
Patient is very guarded and nervous acting. Writer spoke with her and attempted to have conversation with her but she did not verbally respond. She took her medications and began to act as if she was falling and MHT was close by and help her to regain her balance. Writer asked what was wrong and she whispered that she was having a seizure but it was observed by Probation officer and she was not having a seizure. MHT walked her back to her room and she was encouraged to rest. Will continue to monitor with 15 min checks.    05/21/21 2100  Psych Admission Type (Psych Patients Only)  Admission Status Involuntary  Psychosocial Assessment  Patient Complaints None  Eye Contact Staring  Facial Expression Wide-eyed  Affect Anxious  Speech Logical/coherent  Interaction Assertive  Motor Activity Slow  Appearance/Hygiene In scrubs  Behavior Characteristics Cooperative;Guarded;Anxious  Mood Preoccupied  Aggressive Behavior  Effect No apparent injury  Thought Process  Coherency WDL  Content Preoccupation;Blaming others;Paranoia  Delusions Paranoid  Perception Derealization  Hallucination None reported or observed  Judgment Poor  Confusion Moderate  Danger to Self  Current suicidal ideation? Denies  Danger to Others  Danger to Others None reported or observed

## 2021-05-22 LAB — RESP PANEL BY RT-PCR (FLU A&B, COVID) ARPGX2
Influenza A by PCR: NEGATIVE
Influenza B by PCR: NEGATIVE
SARS Coronavirus 2 by RT PCR: NEGATIVE

## 2021-05-22 LAB — GLUCOSE, CAPILLARY
Glucose-Capillary: 167 mg/dL — ABNORMAL HIGH (ref 70–99)
Glucose-Capillary: 177 mg/dL — ABNORMAL HIGH (ref 70–99)
Glucose-Capillary: 181 mg/dL — ABNORMAL HIGH (ref 70–99)
Glucose-Capillary: 174 mg/dL — ABNORMAL HIGH (ref 70–99)

## 2021-05-22 MED ORDER — HALOPERIDOL 5 MG PO TABS
5.0000 mg | ORAL_TABLET | Freq: Two times a day (BID) | ORAL | Status: DC
  Administered 2021-05-22 – 2021-05-23 (×2): 5 mg via ORAL
  Filled 2021-05-22 (×6): qty 1

## 2021-05-22 MED ORDER — BENZTROPINE MESYLATE 0.5 MG PO TABS
0.5000 mg | ORAL_TABLET | Freq: Two times a day (BID) | ORAL | Status: DC
  Administered 2021-05-22 – 2021-05-23 (×2): 0.5 mg via ORAL
  Filled 2021-05-22 (×6): qty 1

## 2021-05-22 MED ORDER — LORAZEPAM 2 MG/ML IJ SOLN: 1.0000 mg | Freq: Four times a day (QID) | INTRAMUSCULAR | Status: DC | PRN

## 2021-05-22 MED ORDER — LORAZEPAM 1 MG PO TABS
1.0000 mg | ORAL_TABLET | Freq: Four times a day (QID) | ORAL | Status: DC | PRN
  Administered 2021-05-22: 1 mg via ORAL
  Filled 2021-05-22: qty 1

## 2021-05-22 MED ORDER — LORAZEPAM 1 MG PO TABS
1.0000 mg | ORAL_TABLET | Freq: Four times a day (QID) | ORAL | Status: DC | PRN
  Administered 2021-05-24: 1 mg via ORAL
  Filled 2021-05-22: qty 1

## 2021-05-22 NOTE — Progress Notes (Signed)
[facility] MD Progress Note  05/22/2021 2:23 PM [name]  MRN:  [id number]  Subjective: [name] reports today, "I feel okay, I also feel bad about other things. I need help". Daily notes: [name] is seen in her room. Chart reviewed. The chart findings discussed with the treatment team. She is sitting down in her room. She presents alert, but with a flat/worried affect. She is making a fair eye contact. She is displaying some thought blocking episodes, having difficulties completing sentences. She says she is taking her medications & she feels she is doing okay on them.  She denies any side effects. She says she slept well last night. The staff reports some worsening latency of speech, having difficulties expressing herself/needs. We recently increased her Haldol 10 mg po bid for her mood symptoms & Cogentin to 1 mg po bid to prevent eps. Her Depakote was increased as well to 500 mg po bid for mood stabilization. Will obtain valproic level as well hepatic panel on 05-24-21 am. She was also started on Propranolol 10 mg bid for elevated heart rate/anxiety. We obtained a U/A for complain of urinary symptoms. The result indicated presence of blood & some leukocytes, moderate. We started her on Bactrim DS bid x 7 days. She is encouraged to drink plenty of fluid during the day. However, after discussing patient current mental state/symptoms with the weekend attending psychiatrist today, she instructed to allow patient to remain on Abilify 15 mg, but to decrease haldol down to 5 mg bid. Chart review indicated patient was on antipsychotic injectable (Haldol Dec) in the remote past. She will also be a good candidate for Act team referral as apart of her discharge plan. We will continue to treat her symptoms, evaluate her response to treatments on daily basis to make adequate treatment decision for her symptoms prior to discharge. Reviewed vital signs (stable).  Reason for admission: 45 year old female with a reported psychiatric  history of "schizoaffective, bipolar, and personality" who was admitted to this psychiatric unit for disorientation, confusion, hallucinations, suicidal thoughts, and delusions. During the her admission interview, about details of her HPI.  She reports that she was driving from [place], to [place], realized that she was not thinking clearly and confused, and went to the [facility] at [facility] for evaluation.    Principal Problem: Schizoaffective disorder, bipolar type ([place])  Diagnosis: Principal Problem:   Schizoaffective disorder, bipolar type ([place]) Active Problems:   Cannabis use disorder, moderate, dependence ([place])  Total Time spent with patient:  25 minutes  Past Psychiatric History: Schizoaffective disorder, bipolar-type.  Past Medical History:  Past Medical History:  Diagnosis Date   Abnormal Pap smear    Bipolar 1 disorder ([place])    Diabetes mellitus without complication (HCC)    Fibroids    Hypertension    IBS (irritable bowel syndrome)     Past Surgical History:  Procedure Laterality Date   CERVICAL BIOPSY     CHOLECYSTECTOMY     ESSURE TUBAL LIGATION     HIATAL HERNIA REPAIR     TUBAL LIGATION     Family History:  Family History  Problem Relation Age of Onset   Diabetes Father    Diabetes Paternal Grandmother    Diabetes Maternal Grandmother    Mental illness Cousin    Healthy Mother    Family Psychiatric  History: See H&P.  Social History:  Social History   Substance and Sexual Activity  Alcohol Use Not Currently   Comment: occasional  Social History   Substance and Sexual Activity  Drug Use Not Currently    Social History   Socioeconomic History   Marital status: Single    Spouse name: Not on file   Number of children: 2   Years of education: Not on file   Highest education level: Master's degree (e.g., MA, MS, MEng, MEd, MSW, MBA)  Occupational History   Not on file  Tobacco Use   Smoking status: Former    Types: Cigarettes    Smokeless tobacco: Never  Vaping Use   Vaping Use: Some days   Substances: Nicotine, Flavoring  Substance and Sexual Activity   Alcohol use: Not Currently    Comment: occasional   Drug use: Not Currently   Sexual activity: Yes    Birth control/protection: Surgical  Other Topics Concern   Not on file  Social History Narrative   ** Merged History Encounter **       Social Determinants of Health   Financial Resource Strain: Not on file  Food Insecurity: Not on file  Transportation Needs: Not on file  Physical Activity: Not on file  Stress: Not on file  Social Connections: Not on file   Additional Social History:   Sleep:  6.5  Appetite:  Good  Current Medications: Current Facility-Administered Medications  Medication Dose Route Frequency Provider Last Rate Last Admin   acetaminophen (TYLENOL) tablet 650 mg  650 mg Oral Q4H PRN Lindell Spar I, NP   650 mg at 05/20/21 1732   atorvastatin (LIPITOR) tablet 80 mg  80 mg Oral Daily Rankin, Shuvon B, NP   80 mg at 05/22/21 2641   benztropine (COGENTIN) tablet 0.5 mg  0.5 mg Oral BID Lindell Spar I, NP       divalproex (DEPAKOTE) DR tablet 500 mg  500 mg Oral BID Lindell Spar I, NP   500 mg at 05/22/21 5830   docusate sodium (COLACE) capsule 100 mg  100 mg Oral Daily Lindell Spar I, NP   100 mg at 05/22/21 9407   ferrous sulfate tablet 325 mg  325 mg Oral BID WC Lindell Spar I, NP   325 mg at 05/22/21 6808   haloperidol (HALDOL) tablet 5 mg  5 mg Oral BID Lindell Spar I, NP       hydrOXYzine (ATARAX/VISTARIL) tablet 25 mg  25 mg Oral TID PRN Prescilla Sours, PA-C   25 mg at 05/21/21 2033   insulin aspart (novoLOG) injection 0-5 Units  0-5 Units Subcutaneous QHS Prescilla Sours, PA-C   3 Units at 05/16/21 2254   insulin aspart (novoLOG) injection 0-9 Units  0-9 Units Subcutaneous TID WC Rankin, Shuvon B, NP   2 Units at 05/22/21 1206   insulin glargine-yfgn (SEMGLEE) injection 16 Units  16 Units Subcutaneous Daily Massengill, Ovid Curd, MD    16 Units at 05/22/21 0821   lisinopril (ZESTRIL) tablet 5 mg  5 mg Oral Daily Rankin, Shuvon B, NP   5 mg at 05/22/21 0820   LORazepam (ATIVAN) tablet 1 mg  1 mg Oral Q6H PRN Lindell Spar I, NP       metFORMIN (GLUCOPHAGE) tablet 1,000 mg  1,000 mg Oral BID WC Rankin, Shuvon B, NP   1,000 mg at 05/22/21 0821   OLANZapine (ZYPREXA) injection 10 mg  10 mg Intramuscular BID PRN Briant Cedar, MD       OLANZapine (ZYPREXA) tablet 10 mg  10 mg Oral BID PRN Briant Cedar, MD   10 mg  at 05/21/21 1618   propranolol (INDERAL) tablet 10 mg  10 mg Oral BID Massengill, Ovid Curd, MD   10 mg at 05/22/21 3710   sulfamethoxazole-trimethoprim (BACTRIM DS) 800-160 MG per tablet 1 tablet  1 tablet Oral Q12H Lindell Spar I, NP   1 tablet at 05/22/21 6269   traZODone (DESYREL) tablet 50 mg  50 mg Oral QHS PRN Prescilla Sours, PA-C   50 mg at 05/21/21 2033   Lab Results:  Results for orders placed or performed during the hospital encounter of 05/16/21 (from the past 48 hour(s))  Glucose, capillary     Status: Abnormal   Collection Time: 05/20/21  5:27 PM  Result Value Ref Range   Glucose-Capillary 121 (H) 70 - 99 mg/dL    Comment: Glucose reference range applies only to samples taken after fasting for at least 8 hours.  Glucose, capillary     Status: Abnormal   Collection Time: 05/20/21  7:57 PM  Result Value Ref Range   Glucose-Capillary 171 (H) 70 - 99 mg/dL    Comment: Glucose reference range applies only to samples taken after fasting for at least 8 hours.  Glucose, capillary     Status: Abnormal   Collection Time: 05/21/21  6:08 AM  Result Value Ref Range   Glucose-Capillary 142 (H) 70 - 99 mg/dL    Comment: Glucose reference range applies only to samples taken after fasting for at least 8 hours.  Glucose, capillary     Status: Abnormal   Collection Time: 05/21/21 12:08 PM  Result Value Ref Range   Glucose-Capillary 159 (H) 70 - 99 mg/dL    Comment: Glucose reference range applies only  to samples taken after fasting for at least 8 hours.  Glucose, capillary     Status: Abnormal   Collection Time: 05/21/21  5:13 PM  Result Value Ref Range   Glucose-Capillary 146 (H) 70 - 99 mg/dL    Comment: Glucose reference range applies only to samples taken after fasting for at least 8 hours.  Glucose, capillary     Status: Abnormal   Collection Time: 05/21/21  8:02 PM  Result Value Ref Range   Glucose-Capillary 151 (H) 70 - 99 mg/dL    Comment: Glucose reference range applies only to samples taken after fasting for at least 8 hours.  Resp Panel by RT-PCR (Flu A&B, Covid) Nasopharyngeal Swab     Status: None   Collection Time: 05/22/21  4:19 AM   Specimen: Nasopharyngeal Swab; Nasopharyngeal(NP) swabs in vial transport medium  Result Value Ref Range   SARS Coronavirus 2 by RT PCR NEGATIVE NEGATIVE    Comment: (NOTE) SARS-CoV-2 target nucleic acids are NOT DETECTED.  The SARS-CoV-2 RNA is generally detectable in upper respiratory specimens during the acute phase of infection. The lowest concentration of SARS-CoV-2 viral copies this assay can detect is 138 copies/mL. A negative result does not preclude SARS-Cov-2 infection and should not be used as the sole basis for treatment or other patient management decisions. A negative result may occur with  improper specimen collection/handling, submission of specimen other than nasopharyngeal swab, presence of viral mutation(s) within the areas targeted by this assay, and inadequate number of viral copies(<138 copies/mL). A negative result must be combined with clinical observations, patient history, and epidemiological information. The expected result is Negative.  Fact Sheet for Patients:  EntrepreneurPulse.com.au  Fact Sheet for Healthcare Providers:  IncredibleEmployment.be  This test is no t yet approved or cleared by the [place] and  has been authorized for detection and/or  diagnosis of SARS-CoV-2 by FDA under an Emergency Use Authorization (EUA). This EUA will remain  in effect (meaning this test can be used) for the duration of the COVID-19 declaration under Section 564(b)(1) of the Act, 21 U.S.C.section 360bbb-3(b)(1), unless the authorization is terminated  or revoked sooner.       Influenza A by PCR NEGATIVE NEGATIVE   Influenza B by PCR NEGATIVE NEGATIVE    Comment: (NOTE) The Xpert Xpress SARS-CoV-2/FLU/RSV plus assay is intended as an aid in the diagnosis of influenza from Nasopharyngeal swab specimens and should not be used as a sole basis for treatment. Nasal washings and aspirates are unacceptable for Xpert Xpress SARS-CoV-2/FLU/RSV testing.  Fact Sheet for Patients: EntrepreneurPulse.com.au  Fact Sheet for Healthcare Providers: IncredibleEmployment.be  This test is not yet approved or cleared by the [place] FDA and has been authorized for detection and/or diagnosis of SARS-CoV-2 by FDA under an Emergency Use Authorization (EUA). This EUA will remain in effect (meaning this test can be used) for the duration of the COVID-19 declaration under Section 564(b)(1) of the Act, 21 U.S.C. section 360bbb-3(b)(1), unless the authorization is terminated or revoked.  Performed at [facility] [place] [address], [place], [place] [address]   Glucose, capillary     Status: Abnormal   Collection Time: 05/22/21  5:34 AM  Result Value Ref Range   Glucose-Capillary 167 (H) 70 - 99 mg/dL    Comment: Glucose reference range applies only to samples taken after fasting for at least 8 hours.  Glucose, capillary     Status: Abnormal   Collection Time: 05/22/21 11:35 AM  Result Value Ref Range   Glucose-Capillary 177 (H) 70 - 99 mg/dL    Comment: Glucose reference range applies only to samples taken after fasting for at least 8 hours.   Blood Alcohol level:  Lab Results  Component Value Date    ETH <10 05/12/2021   ETH <10 24/23/5361   Metabolic Disorder Labs: Lab Results  Component Value Date   HGBA1C 9.2 (H) 05/12/2021   MPG 217.34 05/12/2021   MPG 209 08/19/2020   Lab Results  Component Value Date   PROLACTIN 11.7 05/17/2018   PROLACTIN 105.5 (H) 02/28/2017   Lab Results  Component Value Date   CHOL 101 05/17/2021   TRIG 65 05/17/2021   HDL 32 (L) 05/17/2021   CHOLHDL 3.2 05/17/2021   VLDL 13 05/17/2021   LDLCALC 56 05/17/2021   LDLCALC 49 08/18/2020   Physical Findings: AIMS:  , ,  ,  ,    CIWA:    COWS:     Musculoskeletal: Strength & Muscle Tone: within normal limits Gait & Station: normal Patient leans: N/A  Psychiatric Specialty Exam:  Presentation  General Appearance: Casual; Disheveled  Eye Contact:Fair  Speech:Slow; Other (comment) (Thought blocking)  Speech Volume:Decreased (Presents with latency of speech)  Handedness:Right  Mood and Affect  Mood:Anxious; Dysphoric  Affect:Flat; Tearful  Thought Process  Thought Processes:Other (comment) (Blocked)  Descriptions of Associations:Tangential  Orientation:Full (Time, Place and Person)  Thought Content:Perseveration; Tangential  History of Schizophrenia/Schizoaffective disorder:Yes  Duration of Psychotic Symptoms:Greater than six months  Hallucinations:Hallucinations: None Description of Auditory Hallucinations: NA  Ideas of Reference:Paranoia  Suicidal Thoughts:Suicidal Thoughts: No SI Passive Intent and/or Plan: Without Intent; Without Plan; Without Means to Carry Out; Without Access to Means  Homicidal Thoughts:Homicidal Thoughts: No  Sensorium  Memory:Immediate Fair; Recent Fair; Remote Fair  Judgment:Impaired  Insight:Fair  Executive  Functions  Concentration:Fair  Attention Span:Fair  [place]  Psychomotor Activity  Psychomotor Activity:Psychomotor Activity: Decreased  Assets  Assets:Communication Skills;  Desire for Improvement; Financial Resources/Insurance; Housing; Resilience; Social Support  Sleep  Sleep:Sleep: Fair Number of Hours of Sleep: 5.75  Physical Exam: Physical Exam Vitals and nursing note reviewed.  HENT:     Mouth/Throat:     Pharynx: Oropharynx is clear.  Eyes:     Pupils: Pupils are equal, round, and reactive to light.  Cardiovascular:     Pulses: Normal pulses.     Comments: Elevated b/p: 138/91.   Hx. HTN (essential). Patient is currently in no apparent distress. Pulmonary:     Effort: Pulmonary effort is normal.  Genitourinary:    Comments: Deferred Musculoskeletal:        General: Normal range of motion.     Cervical back: Normal range of motion.  Skin:    General: Skin is warm and dry.  Neurological:     Mental Status: She is alert and oriented to person, place, and time.     Comments: Oriented x 2 (self/situation).   Review of Systems  Constitutional:  Negative for chills, diaphoresis and fever.  HENT:  Negative for congestion and sore throat.   Eyes:  Negative for blurred vision.  Respiratory:  Negative for cough, shortness of breath and wheezing.   Cardiovascular:  Negative for chest pain and palpitations.  Gastrointestinal:  Negative for abdominal pain, constipation, diarrhea, heartburn, nausea and vomiting.  Genitourinary:  Negative for dysuria.  Musculoskeletal:  Negative for joint pain and myalgias.  Skin: Negative.   Neurological:  Negative for dizziness, tingling, tremors, sensory change, speech change, focal weakness, seizures, loss of consciousness, weakness and headaches.  Psychiatric/Behavioral:  Positive for depression, hallucinations (Presents paranoid, delusional, responding to internal stimuli.) and substance abuse (Hx. THC use disorder). Negative for memory loss and suicidal ideas. The patient is not nervous/anxious and does not have insomnia.   Blood pressure 130/75, pulse 91, temperature 97.9 F (36.6 C), temperature source Oral,  resp. rate 16, height 5\' 11"  (1.803 m), weight 114.8 kg, SpO2 100 %. Body mass index is 35.29 kg/m.  Treatment Plan Summary: Daily contact with patient to assess and evaluate symptoms and progress in treatment and Medication management.   Continue inpatient hospitalization.  Will continue today 05/22/2021 plan as below except where it is noted.   Diagnoses:  Schizoaffective disorder, bipolar-type.  Cannabis use disorder.  Other medical issues:  Diabetes Mellitus.  HTN (essential).  Plan.  Schizoaffective disorder:  Continue Abilify 15 mg po daily. Decreased Haldol from 10 mg to Haldol 5 mg po bid.  Continue Depakote 500 mg po bid.  Obtain Depakote level on 05-24-21 am. Obtain Hepatic panel on 05-24-21 am.  EPS prevention. Decreased Cogentin to 0.5 mg po bid.  Anxiety.  Continue Vistaril 25 mg po tid prn. Continue Propranolol 10 mg po bid for anxiety/elevated heart rate.  Insomnia.  Continue Trazodone 50 mg po Q hs prn.  Lab pending: CBC with diff, CMP, T3, T4,  U/A result: (+) for blood, leukocytes- moderate. Continue Bactrim Ds 800-160 mg po bid x 7 days.  Agitation/psychosis.  Continue Olanzapine 10 mg po or IM bid prn. Continue lorazepam 1 mg po Q 6 hrs prn for anxiety.  Other medical issues, continue:  Lipitor 80 mg po daily for high cholesterol.  Insulin glargine-yfgn 16 units subQ daily for DM. Metformin 1,000 mg po bid for diabetes mellitus. Lisinopril 5  mg po daily for hTN. Continue Ferrous sulfate 325 mg po bid for anemia. Continue the sliding scale insulin as recommended per CBG results.  Encourage group attendance/participation. Discharge disposition plan in progress.  Lindell Spar, NP, pmhnp, fnp-bc 05/22/2021, 2:23 PM Patient ID: Valentina Shaggy, female   DOB: January 01, 1976, 45 y.o.   MRN: [id number] Patient ID: Lizmarie Witters, female   DOB: 08/27/1975, 45 y.o.   MRN: [id number] Patient ID: Hazelgrace Bonham, female   DOB: 1975-09-05, 45 y.o.   MRN: [id number] Patient ID:  Alayne Estrella, female   DOB: 12/18/1975, 45 y.o.   MRN: [id number]

## 2021-05-22 NOTE — Progress Notes (Addendum)
   05/22/21 1000  Psych Admission Type (Psych Patients Only)  Admission Status Involuntary  Psychosocial Assessment  Patient Complaints Anxiety;Depression  Eye Contact Staring  Facial Expression Wide-eyed  Affect Anxious  Speech Logical/coherent  Interaction Assertive  Motor Activity Slow  Appearance/Hygiene In scrubs  Behavior Characteristics Cooperative;Guarded;Anxious  Mood Preoccupied  Aggressive Behavior  Effect No apparent injury  Thought Process  Coherency WDL  Content Preoccupation;Paranoia  Delusions Paranoid  Perception Derealization  Hallucination None reported or observed  Judgment Poor  Confusion Moderate  Danger to Self  Current suicidal ideation? Denies  Danger to Others  Danger to Others None reported or observed   D. Pt presents with an anxious affect/ depressed mood-very soft spoken during interactions, and appears to be thought blocking at times.  Pt did become tearful and somewhat panicky around lunch, stating that she was fearful that her boyfriend was dead.  Pt currently denies SI/HI and AVH  A. Labs and vitals monitored. Pt given scheduled meds and prn Ativan for anxiety  Pt supported emotionally and encouraged to express concerns and ask questions.   R. Pt remains safe with 15 minute checks. Will continue POC.

## 2021-05-22 NOTE — Group Note (Signed)
[facility] LCSW Group Therapy Note  Date/Time:  05/22/2021  11:00AM-12:00PM  Type of Therapy and Topic:  Group Therapy:  Music and Mood  Participation Level:  Minimal   Description of Group: In this process group, members listened to a variety of genres of music and identified that different types of music evoke different responses.  Patients were encouraged to identify music that was soothing for them and music that was energizing for them.  Patients discussed how this knowledge can help with wellness and recovery in various ways including managing depression and anxiety as well as encouraging healthy sleep habits.    Therapeutic Goals: Patients will explore the impact of different varieties of music on mood Patients will verbalize the thoughts they have when listening to different types of music Patients will identify music that is soothing to them as well as music that is energizing to them Patients will discuss how to use this knowledge to assist in maintaining wellness and recovery Patients will explore the use of music as a coping skill  Summary of Patient Progress:  At the beginning of group, patient expressed hesitatingly that she felt "perplexed."  She remained anxious and eventually left the group, did not return.    Therapeutic Modalities: Solution Focused Brief Therapy Activity   Selmer Dominion, LCSW

## 2021-05-22 NOTE — Progress Notes (Signed)
Adult Psychoeducational Group Note  Date:  05/22/2021 Time:  8:15 PM  Group Topic/Focus:  Wrap-Up Group:   The focus of this group is to help patients review their daily goal of treatment and discuss progress on daily workbooks.  Participation Level:  Did Not Attend  Participation Quality:   Did Not Attend  Affect:   Did Not Attend  Cognitive:   Did Not Attend  Insight: None  Engagement in Group:   Did Not Attend  Modes of Intervention:   Did Not Attend  Additional Comments:  Pt did not attend evening wrap up group tonight.  Candy Sledge 05/22/2021, 8:15 PM

## 2021-05-22 NOTE — [facility] Group Notes (Signed)
Adult Psychoeducational Group Not Date:  05/22/2021 Time:  0900-1045 Group Topic/Focus: PROGRESSIVE RELAXATION. A group where deep breathing is taught and tensing and relaxation muscle groups is used. Imagery is used as well.  Pts are asked to imagine 3 pillars that hold them up when they are not able to hold themselves up.  Participation Level:  Active  Participation Quality:  Appropriate  Affect: fearful  Cognitive:  Oriented  Insight: Improving  Engagement in Group:  Engaged  Modes of Intervention:  Activity, Discussion, Education, and Support  Additional Comments:  Pt. Was soft spoken and difficult to understand. When she did speak up she was on target with the responses. States what holds her up is "God and family. Her wisdom is: Be strong and take courage.  Paulino Rily

## 2021-05-23 ENCOUNTER — Encounter (HOSPITAL_COMMUNITY): Payer: Self-pay

## 2021-05-23 LAB — GLUCOSE, CAPILLARY
Glucose-Capillary: 158 mg/dL — ABNORMAL HIGH (ref 70–99)
Glucose-Capillary: 125 mg/dL — ABNORMAL HIGH (ref 70–99)
Glucose-Capillary: 158 mg/dL — ABNORMAL HIGH (ref 70–99)
Glucose-Capillary: 155 mg/dL — ABNORMAL HIGH (ref 70–99)

## 2021-05-23 MED ORDER — RISPERIDONE 0.5 MG PO TBDP
1.5000 mg | ORAL_TABLET | Freq: Two times a day (BID) | ORAL | Status: DC
  Administered 2021-05-23 – 2021-05-24 (×2): 1.5 mg via ORAL
  Filled 2021-05-23 (×6): qty 3

## 2021-05-23 MED ORDER — TRAZODONE HCL 50 MG PO TABS
50.0000 mg | ORAL_TABLET | Freq: Once | ORAL | Status: AC
  Administered 2021-05-23: 50 mg via ORAL
  Filled 2021-05-23 (×2): qty 1

## 2021-05-23 NOTE — Progress Notes (Signed)
Adult Psychoeducational Group Note  Date:  05/23/2021 Time:  11:26 PM  Group Topic/Focus:  Wrap-Up Group:   The focus of this group is to help patients review their daily goal of treatment and discuss progress on daily workbooks.  Participation Level:  Did Not Attend  Participation Quality:   Did Not Attend  Affect:   Did not Attend  Cognitive:   Did Not Attend  Insight: None  Engagement in Group:   Did Not Attend  Modes of Intervention:   Did Not Attend  Additional Comments:  Pt did not attend evening wrap up group tonight.  Candy Sledge 05/23/2021, 11:26 PM

## 2021-05-23 NOTE — Progress Notes (Signed)
Patient continues to be suspicious and paranoid. She told Probation officer that she did not want to be called [name]. Writer explained that she must have told a staff member she wanted to use this name. When writer asked if we should call her Jalee she reported that it doesn't matter call her whatever we want. Writer asked what would she like to be called again and she stood at stared at [employer] for a while and then responded whatever you like. Thought blocking continues also. Emotional support provided and safety maintained on unit with 15 min checks.     05/22/21 2030  Psych Admission Type (Psych Patients Only)  Admission Status Involuntary  Psychosocial Assessment  Patient Complaints Worrying  Eye Contact Staring  Facial Expression Wide-eyed  Affect Anxious  Speech Logical/coherent  Interaction Assertive  Motor Activity Slow  Appearance/Hygiene In scrubs  Behavior Characteristics Guarded;Appropriate to situation  Mood Preoccupied;Pleasant;Suspicious  Aggressive Behavior  Effect No apparent injury  Thought Process  Coherency WDL  Content Preoccupation;Paranoia  Delusions Paranoid  Perception Derealization  Hallucination None reported or observed  Judgment Poor  Confusion Moderate  Danger to Self  Current suicidal ideation? Denies  Danger to Others  Danger to Others None reported or observed

## 2021-05-23 NOTE — Progress Notes (Signed)
Pt encouraged to go to group but didn't attend therapeutic relaxation group.

## 2021-05-23 NOTE — Progress Notes (Signed)
DAR NOTE: Patient presents with a flat affect and paranoid behavior.  Reports people looking at her and talking about her medications.  Patient is withdrawn and isolates to her room for majority of this shift.  Denies suicidal thoughts, pain, auditory and visual hallucinations.  Rates depression at 2, hopelessness at 0, and anxiety at 0.  Maintained on routine safety checks.  Medications given as prescribed.  Support and encouragement offered as needed.   States goal for today is "Education officer, museum to coordinate."  Patient is safe on the unit.

## 2021-05-23 NOTE — Progress Notes (Signed)
Pt continues to be confused at times and disorganized. Pt kept to herself much of the evening, pt would interact with peers and staff very minimally .    05/23/21 2200  Psych Admission Type (Psych Patients Only)  Admission Status Involuntary  Psychosocial Assessment  Patient Complaints Worrying  Eye Contact Staring  Facial Expression Wide-eyed  Affect Anxious  Speech Logical/coherent  Interaction Assertive  Motor Activity Slow  Appearance/Hygiene In scrubs  Behavior Characteristics Cooperative  Aggressive Behavior  Effect No apparent injury  Thought Process  Coherency WDL  Content Preoccupation;Paranoia  Delusions Paranoid  Perception Derealization  Hallucination None reported or observed  Judgment Poor  Confusion Moderate  Danger to Self  Current suicidal ideation? Denies  Danger to Others  Danger to Others None reported or observed

## 2021-05-23 NOTE — Progress Notes (Addendum)
[facility] MD Progress Note  05/23/2021 1:52 PM [name]  MRN:  [id number]  Subjective: [name] reports "I feel okay today. I am anxious about where I am going when I leave here."   Evaluation on the unit today 11/21:  Patient is seen, chart reviewed and discussed with the treatment team. Patient is seen in her room with this writer and Tamela Oddi, NP.  She is alert and oriented to place, person, situation. She appears depressed with a flat affect. She stated she is worried and anxious about what her life will look like when she is discharged. She continues to struggle with paranoia. Per nursing note today, patient is paranoid and thinks people are looking at her and talking about her medications. She stated she is anxious about where she will go when she leaves the hospital. Her descriptions are tangential.  She is aware that calling 911 repeatedly "sounds and looks crazy." She stated she is taking her medications as prescribed and denies side effects. Her AIMS score was 0 today. She was visualized later in the day and looked quite confused. Her Abilify was discontinued on Friday and Haldol was decreased to 5 mg twice daily over the weekend. She has not responded well to these antipsychotics. Her haldol will be discontinued today. She will be started on Risperdal 1.5 mg twice daily, first dose starting tonight.  She stated she slept well last night, record reflects she slept 5.75 hours last night. She may benefit from an ACT team referral as apart of her discharge plan.  Reviewed vital signs; BP 119/72, Pulse 94. Will obtain labs tomorrow; TSH, CBC, CMP, Free T4, Vitamin B1 and B12, Vitamin D, Hepatic function panel and Valproic acid level. We will continue to monitor for safety and medication efficacy and titrate medications as needed.  Reason for admission: 45 year old female with a reported psychiatric history of "schizoaffective, bipolar, and personality" who was admitted to this psychiatric unit for disorientation,  confusion, hallucinations, suicidal thoughts, and delusions. During the her admission interview, about details of her HPI.  She reports that she was driving from [place], to [place], realized that she was not thinking clearly and confused, and went to the [facility] for evaluation.    Principal Problem: Schizoaffective disorder, bipolar type ([place])  Diagnosis: Principal Problem:   Schizoaffective disorder, bipolar type ([place]) Active Problems:   Cannabis use disorder, moderate, dependence ([place])  Total Time spent with patient:  25 minutes  Past Psychiatric History: Schizoaffective disorder, bipolar-type.  Past Medical History:  Past Medical History:  Diagnosis Date   Abnormal Pap smear    Bipolar 1 disorder ([place])    Diabetes mellitus without complication (HCC)    Fibroids    Hypertension    IBS (irritable bowel syndrome)     Past Surgical History:  Procedure Laterality Date   CERVICAL BIOPSY     CHOLECYSTECTOMY     ESSURE TUBAL LIGATION     HIATAL HERNIA REPAIR     TUBAL LIGATION     Family History:  Family History  Problem Relation Age of Onset   Diabetes Father    Diabetes Paternal Grandmother    Diabetes Maternal Grandmother    Mental illness Cousin    Healthy Mother    Family Psychiatric  History: See H&P.  Social History:  Social History   Substance and Sexual Activity  Alcohol Use Not Currently   Comment: occasional     Social History   Substance and Sexual Activity  Drug Use  Not Currently    Social History   Socioeconomic History   Marital status: Single    Spouse name: Not on file   Number of children: 2   Years of education: Not on file   Highest education level: Master's degree (e.g., MA, MS, MEng, MEd, MSW, MBA)  Occupational History   Not on file  Tobacco Use   Smoking status: Former    Types: Cigarettes   Smokeless tobacco: Never  Vaping Use   Vaping Use: Some days   Substances: Nicotine, Flavoring  Substance and Sexual  Activity   Alcohol use: Not Currently    Comment: occasional   Drug use: Not Currently   Sexual activity: Yes    Birth control/protection: Surgical  Other Topics Concern   Not on file  Social History Narrative   ** Merged History Encounter **       Social Determinants of Health   Financial Resource Strain: Not on file  Food Insecurity: Not on file  Transportation Needs: Not on file  Physical Activity: Not on file  Stress: Not on file  Social Connections: Not on file   Additional Social History:   Sleep:  6.5  Appetite:  Good  Current Medications: Current Facility-Administered Medications  Medication Dose Route Frequency Provider Last Rate Last Admin   acetaminophen (TYLENOL) tablet 650 mg  650 mg Oral Q4H PRN Lindell Spar I, NP   650 mg at 05/20/21 1732   atorvastatin (LIPITOR) tablet 80 mg  80 mg Oral Daily Rankin, Shuvon B, NP   80 mg at 05/23/21 9562   benztropine (COGENTIN) tablet 0.5 mg  0.5 mg Oral BID Lindell Spar I, NP   0.5 mg at 05/23/21 0820   divalproex (DEPAKOTE) DR tablet 500 mg  500 mg Oral BID Lindell Spar I, NP   500 mg at 05/23/21 1308   docusate sodium (COLACE) capsule 100 mg  100 mg Oral Daily Lindell Spar I, NP   100 mg at 05/23/21 6578   ferrous sulfate tablet 325 mg  325 mg Oral BID WC Nwoko, Herbert Pun I, NP   325 mg at 05/23/21 4696   haloperidol (HALDOL) tablet 5 mg  5 mg Oral BID Lindell Spar I, NP   5 mg at 05/23/21 2952   hydrOXYzine (ATARAX/VISTARIL) tablet 25 mg  25 mg Oral TID PRN Prescilla Sours, PA-C   25 mg at 05/22/21 2023   insulin aspart (novoLOG) injection 0-5 Units  0-5 Units Subcutaneous QHS Prescilla Sours, PA-C   3 Units at 05/16/21 2254   insulin aspart (novoLOG) injection 0-9 Units  0-9 Units Subcutaneous TID WC Rankin, Shuvon B, NP   1 Units at 05/23/21 1212   insulin glargine-yfgn (SEMGLEE) injection 16 Units  16 Units Subcutaneous Daily Massengill, Ovid Curd, MD   16 Units at 05/23/21 0822   lisinopril (ZESTRIL) tablet 5 mg  5 mg Oral  Daily Rankin, Shuvon B, NP   5 mg at 05/23/21 0821   LORazepam (ATIVAN) tablet 1 mg  1 mg Oral Q6H PRN Lindell Spar I, NP       metFORMIN (GLUCOPHAGE) tablet 1,000 mg  1,000 mg Oral BID WC Rankin, Shuvon B, NP   1,000 mg at 05/23/21 0821   OLANZapine (ZYPREXA) injection 10 mg  10 mg Intramuscular BID PRN Briant Cedar, MD       OLANZapine (ZYPREXA) tablet 10 mg  10 mg Oral BID PRN Briant Cedar, MD   10 mg at 05/21/21 [phone]  propranolol (INDERAL) tablet 10 mg  10 mg Oral BID Massengill, Ovid Curd, MD   10 mg at 05/23/21 7616   sulfamethoxazole-trimethoprim (BACTRIM DS) 800-160 MG per tablet 1 tablet  1 tablet Oral Q12H Lindell Spar I, NP   1 tablet at 05/23/21 0737   traZODone (DESYREL) tablet 50 mg  50 mg Oral QHS PRN Prescilla Sours, PA-C   50 mg at 05/22/21 2023   Lab Results:  Results for orders placed or performed during the hospital encounter of 05/16/21 (from the past 48 hour(s))  Glucose, capillary     Status: Abnormal   Collection Time: 05/21/21  5:13 PM  Result Value Ref Range   Glucose-Capillary 146 (H) 70 - 99 mg/dL    Comment: Glucose reference range applies only to samples taken after fasting for at least 8 hours.  Glucose, capillary     Status: Abnormal   Collection Time: 05/21/21  8:02 PM  Result Value Ref Range   Glucose-Capillary 151 (H) 70 - 99 mg/dL    Comment: Glucose reference range applies only to samples taken after fasting for at least 8 hours.  Resp Panel by RT-PCR (Flu A&B, Covid) Nasopharyngeal Swab     Status: None   Collection Time: 05/22/21  4:19 AM   Specimen: Nasopharyngeal Swab; Nasopharyngeal(NP) swabs in vial transport medium  Result Value Ref Range   SARS Coronavirus 2 by RT PCR NEGATIVE NEGATIVE    Comment: (NOTE) SARS-CoV-2 target nucleic acids are NOT DETECTED.  The SARS-CoV-2 RNA is generally detectable in upper respiratory specimens during the acute phase of infection. The lowest concentration of SARS-CoV-2 viral copies this assay  can detect is 138 copies/mL. A negative result does not preclude SARS-Cov-2 infection and should not be used as the sole basis for treatment or other patient management decisions. A negative result may occur with  improper specimen collection/handling, submission of specimen other than nasopharyngeal swab, presence of viral mutation(s) within the areas targeted by this assay, and inadequate number of viral copies(<138 copies/mL). A negative result must be combined with clinical observations, patient history, and epidemiological information. The expected result is Negative.  Fact Sheet for Patients:  EntrepreneurPulse.com.au  Fact Sheet for Healthcare Providers:  IncredibleEmployment.be  This test is no t yet approved or cleared by the [place] FDA and  has been authorized for detection and/or diagnosis of SARS-CoV-2 by FDA under an Emergency Use Authorization (EUA). This EUA will remain  in effect (meaning this test can be used) for the duration of the COVID-19 declaration under Section 564(b)(1) of the Act, 21 U.S.C.section 360bbb-3(b)(1), unless the authorization is terminated  or revoked sooner.       Influenza A by PCR NEGATIVE NEGATIVE   Influenza B by PCR NEGATIVE NEGATIVE    Comment: (NOTE) The Xpert Xpress SARS-CoV-2/FLU/RSV plus assay is intended as an aid in the diagnosis of influenza from Nasopharyngeal swab specimens and should not be used as a sole basis for treatment. Nasal washings and aspirates are unacceptable for Xpert Xpress SARS-CoV-2/FLU/RSV testing.  Fact Sheet for Patients: EntrepreneurPulse.com.au  Fact Sheet for Healthcare Providers: IncredibleEmployment.be  This test is not yet approved or cleared by the [place] FDA and has been authorized for detection and/or diagnosis of SARS-CoV-2 by FDA under an Emergency Use Authorization (EUA). This EUA will remain in effect  (meaning this test can be used) for the duration of the COVID-19 declaration under Section 564(b)(1) of the Act, 21 U.S.C. section 360bbb-3(b)(1), unless the authorization is terminated or  revoked.  Performed at [facility], [place] [address], [place], [place] [address]   Glucose, capillary     Status: Abnormal   Collection Time: 05/22/21  5:34 AM  Result Value Ref Range   Glucose-Capillary 167 (H) 70 - 99 mg/dL    Comment: Glucose reference range applies only to samples taken after fasting for at least 8 hours.  Glucose, capillary     Status: Abnormal   Collection Time: 05/22/21 11:35 AM  Result Value Ref Range   Glucose-Capillary 177 (H) 70 - 99 mg/dL    Comment: Glucose reference range applies only to samples taken after fasting for at least 8 hours.  Glucose, capillary     Status: Abnormal   Collection Time: 05/22/21  5:35 PM  Result Value Ref Range   Glucose-Capillary 181 (H) 70 - 99 mg/dL    Comment: Glucose reference range applies only to samples taken after fasting for at least 8 hours.  Glucose, capillary     Status: Abnormal   Collection Time: 05/22/21  7:34 PM  Result Value Ref Range   Glucose-Capillary 174 (H) 70 - 99 mg/dL    Comment: Glucose reference range applies only to samples taken after fasting for at least 8 hours.  Glucose, capillary     Status: Abnormal   Collection Time: 05/23/21  5:52 AM  Result Value Ref Range   Glucose-Capillary 158 (H) 70 - 99 mg/dL    Comment: Glucose reference range applies only to samples taken after fasting for at least 8 hours.  Glucose, capillary     Status: Abnormal   Collection Time: 05/23/21 12:02 PM  Result Value Ref Range   Glucose-Capillary 125 (H) 70 - 99 mg/dL    Comment: Glucose reference range applies only to samples taken after fasting for at least 8 hours.   Blood Alcohol level:  Lab Results  Component Value Date   ETH <10 05/12/2021   ETH <10 96/22/2979   Metabolic Disorder Labs: Lab Results   Component Value Date   HGBA1C 9.2 (H) 05/12/2021   MPG 217.34 05/12/2021   MPG 209 08/19/2020   Lab Results  Component Value Date   PROLACTIN 11.7 05/17/2018   PROLACTIN 105.5 (H) 02/28/2017   Lab Results  Component Value Date   CHOL 101 05/17/2021   TRIG 65 05/17/2021   HDL 32 (L) 05/17/2021   CHOLHDL 3.2 05/17/2021   VLDL 13 05/17/2021   LDLCALC 56 05/17/2021   LDLCALC 49 08/18/2020   Physical Findings: AIMS: Facial and Oral Movements Muscles of Facial Expression: None, normal Lips and Perioral Area: None, normal Jaw: None, normal Tongue: None, normal,Extremity Movements Upper (arms, wrists, hands, fingers): None, normal Lower (legs, knees, ankles, toes): None, normal,  Overall Severity Severity of abnormal movements (highest score from questions above): None, normal Incapacitation due to abnormal movements: None, normal Patient's awareness of abnormal movements (rate only patient's report): No Awareness,  Dental Status Current problems with teeth and/or dentures?: No Does patient usually wear dentures?: No  CIWA:    COWS:     Musculoskeletal: Strength & Muscle Tone: within normal limits Gait & Station: normal Patient leans: N/A  Psychiatric Specialty Exam:  Presentation  General Appearance: Casual; Appropriate for Environment  Eye Contact:Good  Speech:Normal  Speech Volume: Normal  Handedness:Right  Mood and Affect  Mood:Anxious; Depressed  Affect:Appropriate; Congruent, depressed, anxious  Thought Process  Thought Processes:Coherent  Descriptions of Associations:Tangential  Orientation:Full (Time, Place and Person)  Thought Content:Tangential; Rumination  History  of Schizophrenia/Schizoaffective disorder:Yes  Duration of Psychotic Symptoms:Greater than six months  Hallucinations:Hallucinations: None Description of Auditory Hallucinations: NA  Ideas of Reference:Paranoia  Suicidal Thoughts:Suicidal Thoughts: No  Homicidal  Thoughts:Homicidal Thoughts: No  Sensorium  Memory:Immediate Fair; Recent Fair; Remote Fair  Judgment:Impaired  Insight:Fair  Executive Functions  Concentration:Fair  Attention Span:Fair  [place]  Psychomotor Activity  Psychomotor Activity:Psychomotor Activity: Normal  Assets  Assets:Communication Skills; Desire for Improvement; Financial Resources/Insurance; Housing; Resilience; Social Support  Sleep  Sleep:Sleep: Fair Number of Hours of Sleep: 5.75  Physical Exam: Physical Exam Vitals and nursing note reviewed.  HENT:     Mouth/Throat:     Pharynx: Oropharynx is clear.  Eyes:     Pupils: Pupils are equal, round, and reactive to light.  Cardiovascular:     Pulses: Normal pulses.     Comments: Elevated b/p: 138/91.   Hx. HTN (essential). Patient is currently in no apparent distress. Pulmonary:     Effort: Pulmonary effort is normal.  Genitourinary:    Comments: Deferred Musculoskeletal:        General: Normal range of motion.     Cervical back: Normal range of motion.  Skin:    General: Skin is warm and dry.  Neurological:     Mental Status: She is alert and oriented to person, place, and time.     Comments: Oriented x 2 (self/situation).   Review of Systems  Constitutional:  Negative for chills, diaphoresis and fever.  HENT:  Negative for congestion and sore throat.   Eyes:  Negative for blurred vision.  Respiratory:  Negative for cough, shortness of breath and wheezing.   Cardiovascular:  Negative for chest pain and palpitations.  Gastrointestinal:  Negative for abdominal pain, constipation, diarrhea, heartburn, nausea and vomiting.  Genitourinary:  Negative for dysuria.  Musculoskeletal:  Negative for joint pain and myalgias.  Skin: Negative.   Neurological:  Negative for dizziness, tingling, tremors, sensory change, speech change, focal weakness, seizures, loss of consciousness, weakness and headaches.   Psychiatric/Behavioral:  Negative for hallucinations (denies hallucinations), memory loss and suicidal ideas. Substance abuse: Hx. THC use disorder.The patient is not nervous/anxious and does not have insomnia.    Blood pressure 119/72, pulse 94, temperature 97.8 F (36.6 C), temperature source Oral, resp. rate 16, height 5\' 11"  (1.803 m), weight 114.8 kg, SpO2 100 %. Body mass index is 35.29 kg/m.  Treatment Plan Summary: Daily contact with patient to assess and evaluate symptoms and progress in treatment and Medication management.   Continue inpatient hospitalization.  Will continue today 05/23/2021 plan as below except where it is noted.   Diagnoses:  Schizoaffective disorder, bipolar-type.  Cannabis use disorder.  Other medical issues:  Diabetes Mellitus.  HTN (essential).  Plan.  Schizoaffective disorder:  Discontinued Abilify 15 mg po daily on 05/20/2021 Discontinue Haldol 5 mg po bid on 11/21. Initiate Risperdal 1.5 mg PO BID, first dose today at 2000.   Continue Depakote 500 mg po bid.  Labs ordered: Valproic acid level, hepatic function panel, TSH, Free T4, Vitamin B1, B12 and D levels, CBC, CMP.   EPS prevention. Continue Cogentin to 0.5 mg po bid.  Anxiety.  Continue Vistaril 25 mg po tid prn. Continue Propranolol 10 mg po bid for anxiety/elevated heart rate.  Insomnia.  Continue Trazodone 50 mg po Q hs prn.  UTI:  Continue Bactrim Ds 800-160 mg po bid x 7 days. First dose on 11/18 at 1216.   Agitation/psychosis.  Continue Olanzapine  10 mg po or IM bid prn. Continue lorazepam 1 mg po Q 6 hrs prn for anxiety.  Other medical issues, continue:  Lipitor 80 mg po daily for high cholesterol.  Insulin glargine-yfgn 16 units subQ daily for DM. Metformin 1,000 mg po bid for diabetes mellitus. Lisinopril 5 mg po daily for HTN. Continue Ferrous sulfate 325 mg po bid for anemia. Continue the sliding scale insulin as recommended per CBG results.  Encourage  group attendance/participation. Discharge disposition plan in progress.  Ethelene Hal, NP 05/23/2021, 3:59 PM

## 2021-05-23 NOTE — BH IP Treatment Plan (Signed)
Interdisciplinary Treatment and Diagnostic Plan Update  05/23/2021 Time of Session: 9:40am [name] MRN: [id number]  Principal Diagnosis: Schizoaffective disorder, bipolar type ([place])  Secondary Diagnoses: Principal Problem:   Schizoaffective disorder, bipolar type ([place]) Active Problems:   Cannabis use disorder, moderate, dependence ([place])   Current Medications:  Current Facility-Administered Medications  Medication Dose Route Frequency Provider Last Rate Last Admin   acetaminophen (TYLENOL) tablet 650 mg  650 mg Oral Q4H PRN Lindell Spar I, NP   650 mg at 05/20/21 1732   atorvastatin (LIPITOR) tablet 80 mg  80 mg Oral Daily Rankin, Shuvon B, NP   80 mg at 05/23/21 1749   benztropine (COGENTIN) tablet 0.5 mg  0.5 mg Oral BID Lindell Spar I, NP   0.5 mg at 05/23/21 0820   divalproex (DEPAKOTE) DR tablet 500 mg  500 mg Oral BID Lindell Spar I, NP   500 mg at 05/23/21 4496   docusate sodium (COLACE) capsule 100 mg  100 mg Oral Daily Lindell Spar I, NP   100 mg at 05/23/21 7591   ferrous sulfate tablet 325 mg  325 mg Oral BID WC Lindell Spar I, NP   325 mg at 05/23/21 6384   haloperidol (HALDOL) tablet 5 mg  5 mg Oral BID Lindell Spar I, NP   5 mg at 05/23/21 6659   hydrOXYzine (ATARAX/VISTARIL) tablet 25 mg  25 mg Oral TID PRN Prescilla Sours, PA-C   25 mg at 05/22/21 2023   insulin aspart (novoLOG) injection 0-5 Units  0-5 Units Subcutaneous QHS Prescilla Sours, PA-C   3 Units at 05/16/21 2254   insulin aspart (novoLOG) injection 0-9 Units  0-9 Units Subcutaneous TID WC Rankin, Shuvon B, NP   2 Units at 05/23/21 0621   insulin glargine-yfgn (SEMGLEE) injection 16 Units  16 Units Subcutaneous Daily Massengill, Ovid Curd, MD   16 Units at 05/23/21 0822   lisinopril (ZESTRIL) tablet 5 mg  5 mg Oral Daily Rankin, Shuvon B, NP   5 mg at 05/23/21 0821   LORazepam (ATIVAN) tablet 1 mg  1 mg Oral Q6H PRN Lindell Spar I, NP       metFORMIN (GLUCOPHAGE) tablet 1,000 mg  1,000 mg Oral BID WC Rankin,  Shuvon B, NP   1,000 mg at 05/23/21 0821   OLANZapine (ZYPREXA) injection 10 mg  10 mg Intramuscular BID PRN Briant Cedar, MD       OLANZapine (ZYPREXA) tablet 10 mg  10 mg Oral BID PRN Briant Cedar, MD   10 mg at 05/21/21 1618   propranolol (INDERAL) tablet 10 mg  10 mg Oral BID Massengill, Ovid Curd, MD   10 mg at 05/23/21 9357   sulfamethoxazole-trimethoprim (BACTRIM DS) 800-160 MG per tablet 1 tablet  1 tablet Oral Q12H Lindell Spar I, NP   1 tablet at 05/23/21 0177   traZODone (DESYREL) tablet 50 mg  50 mg Oral QHS PRN Prescilla Sours, PA-C   50 mg at 05/22/21 2023   PTA Medications: Medications Prior to Admission  Medication Sig Dispense Refill Last Dose   Accu-Chek Softclix Lancets lancets Use to check FSBS BID. Dx: E11.42 100 each 2    ARIPiprazole (ABILIFY) 10 MG tablet Take 1 tablet (10 mg total) by mouth daily. 30 tablet 0    atorvastatin (LIPITOR) 80 MG tablet Take 1 tablet (80 mg total) by mouth daily. 90 tablet 0    Blood Glucose Monitoring Suppl (ACCU-CHEK GUIDE ME) w/Device KIT Use to check FSBS BID.  Dx: E11.42 1 kit 0    Blood Glucose Monitoring Suppl (TRUE METRIX METER) w/Device KIT Use as directed 1 kit 0    dapagliflozin propanediol (FARXIGA) 10 MG TABS tablet Take 1 tablet (10 mg total) by mouth daily before breakfast. 90 tablet 0    divalproex (DEPAKOTE) 250 MG DR tablet Take 1 tablet (250 mg total) by mouth 2 (two) times daily. 60 tablet 0    glucose blood (TRUE METRIX BLOOD GLUCOSE TEST) test strip Use as instructed 100 each 12    Insulin Pen Needle (TRUEPLUS PEN NEEDLES) 32G X 4 MM MISC Use as instructed to inject Victoza daily. 100 each 0    liraglutide (VICTOZA) 18 MG/3ML SOPN INJECT 1.8 MG UNDER THE SKIN EVERY DAY (Patient taking differently: Inject 1.8 mg into the skin daily.) 9 mL 0    lisinopril (ZESTRIL) 5 MG tablet Take 1 tablet (5 mg total) by mouth daily. 90 tablet 0    LORazepam (ATIVAN) 1 MG tablet Take 1 tablet (1 mg total) by mouth 3 (three)  times daily. 90 tablet 0    metFORMIN (GLUCOPHAGE) 1000 MG tablet Take 1 tablet (1,000 mg total) by mouth 2 (two) times daily with a meal. 180 tablet 0    neomycin-polymyxin-hydrocortisone (CORTISPORIN) 3.5-10000-1 OTIC suspension Place 4 drops into the right ear 3 (three) times daily. (Patient not taking: No sig reported) 10 mL 0    nitrofurantoin, macrocrystal-monohydrate, (MACROBID) 100 MG capsule Take 1 capsule (100 mg total) by mouth 2 (two) times daily. (Patient taking differently: Take 100 mg by mouth See admin instructions. Bid x 5 days) 10 capsule 0    TRUEplus Lancets 28G MISC Use as directed 100 each 1     Patient Stressors: Other: unable to verbalize    Patient Strengths: Capable of independent living  Communication skills  Motivation for treatment/growth  Supportive family/friends  Work skills   Treatment Modalities: Medication Management, Group therapy, Case management,  1 to 1 session with clinician, Psychoeducation, Recreational therapy.   Physician Treatment Plan for Primary Diagnosis: Schizoaffective disorder, bipolar type ([place]) Long Term Goal(s): Improvement in symptoms so as ready for discharge   Short Term Goals: Ability to identify changes in lifestyle to reduce recurrence of condition will improve Ability to verbalize feelings will improve Ability to disclose and discuss suicidal ideas Ability to demonstrate self-control will improve Ability to identify and develop effective coping behaviors will improve Ability to maintain clinical measurements within normal limits will improve Compliance with prescribed medications will improve Ability to identify triggers associated with substance abuse/mental health issues will improve  Medication Management: Evaluate patient's response, side effects, and tolerance of medication regimen.  Therapeutic Interventions: 1 to 1 sessions, Unit Group sessions and Medication administration.  Evaluation of Outcomes:  Progressing  Physician Treatment Plan for Secondary Diagnosis: Principal Problem:   Schizoaffective disorder, bipolar type ([place]) Active Problems:   Cannabis use disorder, moderate, dependence ([place])  Long Term Goal(s): Improvement in symptoms so as ready for discharge   Short Term Goals: Ability to identify changes in lifestyle to reduce recurrence of condition will improve Ability to verbalize feelings will improve Ability to disclose and discuss suicidal ideas Ability to demonstrate self-control will improve Ability to identify and develop effective coping behaviors will improve Ability to maintain clinical measurements within normal limits will improve Compliance with prescribed medications will improve Ability to identify triggers associated with substance abuse/mental health issues will improve     Medication Management: Evaluate patient's response, side effects, and  tolerance of medication regimen.  Therapeutic Interventions: 1 to 1 sessions, Unit Group sessions and Medication administration.  Evaluation of Outcomes: Progressing   RN Treatment Plan for Primary Diagnosis: Schizoaffective disorder, bipolar type ([place]) Long Term Goal(s): Knowledge of disease and therapeutic regimen to maintain health will improve  Short Term Goals: Ability to remain free from injury will improve, Ability to verbalize frustration and anger appropriately will improve, Ability to demonstrate self-control, Ability to participate in decision making will improve, Ability to verbalize feelings will improve, Ability to identify and develop effective coping behaviors will improve, and Compliance with prescribed medications will improve  Medication Management: RN will administer medications as ordered by provider, will assess and evaluate patient's response and provide education to patient for prescribed medication. RN will report any adverse and/or side effects to prescribing provider.  Therapeutic  Interventions: 1 on 1 counseling sessions, Psychoeducation, Medication administration, Evaluate responses to treatment, Monitor vital signs and CBGs as ordered, Perform/monitor CIWA, COWS, AIMS and Fall Risk screenings as ordered, Perform wound care treatments as ordered.  Evaluation of Outcomes: Progressing   LCSW Treatment Plan for Primary Diagnosis: Schizoaffective disorder, bipolar type ([place]) Long Term Goal(s): Safe transition to appropriate next level of care at discharge, Engage patient in therapeutic group addressing interpersonal concerns.  Short Term Goals: Engage patient in aftercare planning with referrals and resources, Increase social support, Increase ability to appropriately verbalize feelings, Increase emotional regulation, Identify triggers associated with mental health/substance abuse issues, and Increase skills for wellness and recovery  Therapeutic Interventions: Assess for all discharge needs, 1 to 1 time with Social worker, Explore available resources and support systems, Assess for adequacy in community support network, Educate family and significant other(s) on suicide prevention, Complete Psychosocial Assessment, Interpersonal group therapy.  Evaluation of Outcomes: Progressing   Progress in Treatment: Attending groups: Yes and no Participating in groups: No. Taking medication as prescribed: Yes. Toleration medication: Yes. Family/Significant other contact made: Yes, will contact:  with mother Patient understands diagnosis: No. Discussing patient identified problems/goals with staff: Yes. Medical problems stabilized or resolved: Yes. Denies suicidal/homicidal ideation: Yes. Issues/concerns per patient self-inventory: No.     New problem(s) identified: No, Describe:  None    New Short Term/Long Term Goal(s): medication stabilization, elimination of SI thoughts, development of comprehensive mental wellness plan.    Patient Goals: "To speak with a Education officer, museum  and a doctor"    Discharge Plan or Barriers: Patient is to continue services through [facility] at discharge   Reason for Continuation of Hospitalization: Anxiety Delusions  Medication stabilization   Estimated Length of Stay: 3 to 5 days    Scribe for Treatment Team: Vassie Moselle, LCSW 05/23/2021 10:57 AM

## 2021-05-23 NOTE — Progress Notes (Signed)
Pt requesting something to help her sleep, Cody NP ordered 1 x 50 mg Trazodone

## 2021-05-23 NOTE — Group Note (Signed)
LCSW Group Therapy Note     Type of Therapy and Topic:  Group Therapy: Worry    Participation Level: Did Not Attend   Description of Group:   In this group, patients learned how to define worry. Patients were asked to identify a time they felt anxiety or something that triggers their anxiety. Patients engaged in a matching interactive activity matching bugs, with each match facts about worry was shared. Patients and CSW engaged in discussion surrounding each fact / definition shared. Patients were then asked to explore positive coping mechanisms for worry and discussed things like unrealistic worry, things out of our control and times that they worried about something but actually ended up enjoying it (I.e. Learning to ride a bike). Patients discussed several new ways to handle worry such as music, journaling, thinking about positive endings instead of negative, drawing, happy place, relaxation skills (deep breathing, progressive muscle relaxation and meditation) and engaging in a hobby. CSW asked patients to commit to trying a positive coping skill when faced with worry in the future.   Therapeutic Goals: Patients will recall a time they felt worried or identify something they worry about often. Patients will learn how to define worry.  Patients will learn that some worry cannot be erased, as some things are out of out control and at times our worries are not likely to happen.  Patients will be asked to do some self-reflection with their own worry (throughout the matching activity). Patients will be asked to practice impulse control with the matching game and imagery with the happy place during this group.   Patients were asked to share ways they can cope with anxiety.       Summary of Patient Progress:  Did not attend     Therapeutic Modalities:   Cognitive Behavioral Therapy Motivational Interviewing  Brief Therapy   [place] Mc[place]ough, LCSW, [facility]

## 2021-05-23 NOTE — BHH Counselor (Signed)
CSW spoke with mother, Corky Sing, who requested an update for her daughter. Mother also worried about IVC court date tomorrow.  CSW explained process of IVC court date and the patient's ability to participate via telehealth. Mother demonstrated understanding.  Mother also asked for ongoing updates as patient progresses and discharge plans as careteam gets closer to discharge.    Teddie Mehta, LCSW, [facility] Social Worker  [facility]

## 2021-05-24 LAB — VITAMIN D 25 HYDROXY (VIT D DEFICIENCY, FRACTURES): Vit D, 25-Hydroxy: 17.6 ng/mL — ABNORMAL LOW (ref 30–100)

## 2021-05-24 LAB — GLUCOSE, CAPILLARY
Glucose-Capillary: 191 mg/dL — ABNORMAL HIGH (ref 70–99)
Glucose-Capillary: 148 mg/dL — ABNORMAL HIGH (ref 70–99)
Glucose-Capillary: 133 mg/dL — ABNORMAL HIGH (ref 70–99)
Glucose-Capillary: 164 mg/dL — ABNORMAL HIGH (ref 70–99)

## 2021-05-24 LAB — CBC
HCT: 33.5 % — ABNORMAL LOW (ref 36.0–46.0)
Hemoglobin: 10.4 g/dL — ABNORMAL LOW (ref 12.0–15.0)
MCH: 24.1 pg — ABNORMAL LOW (ref 26.0–34.0)
MCHC: 31 g/dL (ref 30.0–36.0)
MCV: 77.5 fL — ABNORMAL LOW (ref 80.0–100.0)
Platelets: 343 10*3/uL (ref 150–400)
RBC: 4.32 MIL/uL (ref 3.87–5.11)
RDW: 16.7 % — ABNORMAL HIGH (ref 11.5–15.5)
WBC: 5.2 10*3/uL (ref 4.0–10.5)
nRBC: 0 % (ref 0.0–0.2)

## 2021-05-24 LAB — COMPREHENSIVE METABOLIC PANEL
ALT: 18 U/L (ref 0–44)
AST: 14 U/L — ABNORMAL LOW (ref 15–41)
Albumin: 3.2 g/dL — ABNORMAL LOW (ref 3.5–5.0)
Alkaline Phosphatase: 58 U/L (ref 38–126)
Anion gap: 8 (ref 5–15)
BUN: 10 mg/dL (ref 6–20)
CO2: 23 mmol/L (ref 22–32)
Calcium: 8.7 mg/dL — ABNORMAL LOW (ref 8.9–10.3)
Chloride: 105 mmol/L (ref 98–111)
Creatinine, Ser: 0.76 mg/dL (ref 0.44–1.00)
GFR, Estimated: >60 mL/min (ref >60)
Glucose, Bld: 190 mg/dL — ABNORMAL HIGH (ref 70–99)
Potassium: 4.3 mmol/L (ref 3.5–5.1)
Sodium: 136 mmol/L (ref 135–145)
Total Bilirubin: 0.3 mg/dL (ref 0.3–1.2)
Total Protein: 6.4 g/dL — ABNORMAL LOW (ref 6.5–8.1)

## 2021-05-24 LAB — VALPROIC ACID LEVEL: Valproic Acid Lvl: 64 ug/mL (ref 50.0–100.0)

## 2021-05-24 LAB — T4, FREE: Free T4: 1.11 ng/dL (ref 0.61–1.12)

## 2021-05-24 LAB — TSH: TSH: 0.408 u[IU]/mL (ref 0.350–4.500)

## 2021-05-24 LAB — VITAMIN B12: Vitamin B-12: 759 pg/mL (ref 180–914)

## 2021-05-24 MED ORDER — VITAMIN D3 25 MCG PO TABS
1000.0000 [IU] | ORAL_TABLET | Freq: Every day | ORAL | Status: DC
  Administered 2021-05-24 – 2021-06-01 (×9): 1000 [IU] via ORAL
  Filled 2021-05-24 (×11): qty 1

## 2021-05-24 MED ORDER — LORAZEPAM 1 MG PO TABS
1.0000 mg | ORAL_TABLET | Freq: Two times a day (BID) | ORAL | Status: DC
  Administered 2021-05-24 – 2021-05-25 (×2): 1 mg via ORAL
  Filled 2021-05-24 (×2): qty 1

## 2021-05-24 MED ORDER — QUETIAPINE FUMARATE 50 MG PO TABS
50.0000 mg | ORAL_TABLET | Freq: Every day | ORAL | Status: DC
  Administered 2021-05-24 – 2021-05-31 (×8): 50 mg via ORAL
  Filled 2021-05-24 (×10): qty 1

## 2021-05-24 MED ORDER — RISPERIDONE 2 MG PO TBDP
2.0000 mg | ORAL_TABLET | Freq: Two times a day (BID) | ORAL | Status: DC
  Administered 2021-05-24 – 2021-05-25 (×2): 2 mg via ORAL
  Filled 2021-05-24 (×7): qty 1

## 2021-05-24 NOTE — Progress Notes (Signed)
[facility] MD Progress Note  05/24/2021 1:07 PM [name]  MRN:  [id number]  Subjective: Shakari reports "I feel okay today. I am anxious about where I am going when I leave here."   Evaluation on the unit today 11/21:  Patient is seen, chart reviewed and discussed with the treatment team. Patient is seen in her room with this writer and Tamela Oddi, NP. She is isolative to her room, her affect is flat. She continues to struggle with paranoia. She is hesitant to go outside or to the gym. She is not attending group sessions. She believes people are watching her and reporting on her to staff. She stated she did not want to go to the gym yesterday because she of a particular girl who was "fishy in her reporting of her." She stated she sometimes thinks people are behind her. She is hearing people in the hall and in the neighboring rooms and appears to be having some trouble discerning what is real from what is not. She is able to reason logically for the most part. She stated she knows she is paranoid but feels it has improved since being in the hospital.  Busch-Francis Catatonia scale was 0 today.  Her Abilify was discontinued on Friday and Haldol was decreased to 5 mg twice daily over the weekend. She has not responded well to these antipsychotics. Her haldol will be discontinued today. Risperdal was initiated on Monday at 1.5 mg BID and will be increased to 2 mg BID starting with the evening dose tonight. She will also be started on Seroquel 50 mg at bedtime for paranoia and sleep. She stated she slept well last night, however record reflects she slept 3 hours last night. She stated she kept hearing people saying, "Onna get up and Janney do this."  Reviewed vital signs; BP 108/76, Pulse 92. We will continue to monitor for safety and medication efficacy and titrate medications as needed.   Reason for admission: 45 year old female with a reported psychiatric history of "schizoaffective, bipolar, and personality" who was admitted  to this psychiatric unit for disorientation, confusion, hallucinations, suicidal thoughts, and delusions. During the her admission interview, about details of her HPI.  She reports that she was driving from [place], to [place], realized that she was not thinking clearly and confused, and went to the [facility] for evaluation.    Principal Problem: Schizoaffective disorder, bipolar type ([place])  Diagnosis: Principal Problem:   Schizoaffective disorder, bipolar type ([place]) Active Problems:   Cannabis use disorder, moderate, dependence ([place])  Total Time spent with patient:  25 minutes  Past Psychiatric History: Schizoaffective disorder, bipolar-type.  Past Medical History:  Past Medical History:  Diagnosis Date   Abnormal Pap smear    Bipolar 1 disorder ([place])    Diabetes mellitus without complication (HCC)    Fibroids    Hypertension    IBS (irritable bowel syndrome)     Past Surgical History:  Procedure Laterality Date   CERVICAL BIOPSY     CHOLECYSTECTOMY     ESSURE TUBAL LIGATION     HIATAL HERNIA REPAIR     TUBAL LIGATION     Family History:  Family History  Problem Relation Age of Onset   Diabetes Father    Diabetes Paternal Grandmother    Diabetes Maternal Grandmother    Mental illness Cousin    Healthy Mother    Family Psychiatric  History: See H&P.  Social History:  Social History   Substance and Sexual Activity  Alcohol Use Not Currently   Comment: occasional     Social History   Substance and Sexual Activity  Drug Use Not Currently    Social History   Socioeconomic History   Marital status: Single    Spouse name: Not on file   Number of children: 2   Years of education: Not on file   Highest education level: Master's degree (e.g., MA, MS, MEng, MEd, MSW, MBA)  Occupational History   Not on file  Tobacco Use   Smoking status: Former    Types: Cigarettes   Smokeless tobacco: Never  Vaping Use   Vaping Use: Some days    Substances: Nicotine, Flavoring  Substance and Sexual Activity   Alcohol use: Not Currently    Comment: occasional   Drug use: Not Currently   Sexual activity: Yes    Birth control/protection: Surgical  Other Topics Concern   Not on file  Social History Narrative   ** Merged History Encounter **       Social Determinants of Health   Financial Resource Strain: Not on file  Food Insecurity: Not on file  Transportation Needs: Not on file  Physical Activity: Not on file  Stress: Not on file  Social Connections: Not on file   Additional Social History:   Sleep:  6.5  Appetite:  Good  Current Medications: Current Facility-Administered Medications  Medication Dose Route Frequency Provider Last Rate Last Admin   acetaminophen (TYLENOL) tablet 650 mg  650 mg Oral Q4H PRN Lindell Spar I, NP   650 mg at 05/20/21 1732   atorvastatin (LIPITOR) tablet 80 mg  80 mg Oral Daily Rankin, Shuvon B, NP   80 mg at 05/24/21 0758   divalproex (DEPAKOTE) DR tablet 500 mg  500 mg Oral BID Lindell Spar I, NP   500 mg at 05/24/21 0757   docusate sodium (COLACE) capsule 100 mg  100 mg Oral Daily Lindell Spar I, NP   100 mg at 05/24/21 0802   ferrous sulfate tablet 325 mg  325 mg Oral BID WC Lindell Spar I, NP   325 mg at 05/24/21 0759   hydrOXYzine (ATARAX/VISTARIL) tablet 25 mg  25 mg Oral TID PRN Prescilla Sours, PA-C   25 mg at 05/23/21 2235   insulin aspart (novoLOG) injection 0-5 Units  0-5 Units Subcutaneous QHS Prescilla Sours, PA-C   3 Units at 05/16/21 2254   insulin aspart (novoLOG) injection 0-9 Units  0-9 Units Subcutaneous TID WC Rankin, Shuvon B, NP   1 Units at 05/24/21 1207   insulin glargine-yfgn (SEMGLEE) injection 16 Units  16 Units Subcutaneous Daily Massengill, Ovid Curd, MD   16 Units at 05/24/21 0757   lisinopril (ZESTRIL) tablet 5 mg  5 mg Oral Daily Rankin, Shuvon B, NP   5 mg at 05/24/21 0757   LORazepam (ATIVAN) tablet 1 mg  1 mg Oral Q6H PRN Lindell Spar I, NP       metFORMIN  (GLUCOPHAGE) tablet 1,000 mg  1,000 mg Oral BID WC Rankin, Shuvon B, NP   1,000 mg at 05/24/21 0758   OLANZapine (ZYPREXA) injection 10 mg  10 mg Intramuscular BID PRN Briant Cedar, MD       OLANZapine (ZYPREXA) tablet 10 mg  10 mg Oral BID PRN Briant Cedar, MD   10 mg at 05/21/21 1618   propranolol (INDERAL) tablet 10 mg  10 mg Oral BID Janine Limbo, MD   10 mg at 05/24/21 0757   QUEtiapine (  SEROQUEL) tablet 50 mg  50 mg Oral QHS Ethelene Hal, NP       risperiDONE (RISPERDAL M-TABS) disintegrating tablet 2 mg  2 mg Oral BID Ethelene Hal, NP       sulfamethoxazole-trimethoprim (BACTRIM DS) 800-160 MG per tablet 1 tablet  1 tablet Oral Q12H Lindell Spar I, NP   1 tablet at 05/24/21 0757   traZODone (DESYREL) tablet 50 mg  50 mg Oral QHS PRN Prescilla Sours, PA-C   50 mg at 05/23/21 2003   Lab Results:  Results for orders placed or performed during the hospital encounter of 05/16/21 (from the past 48 hour(s))  Glucose, capillary     Status: Abnormal   Collection Time: 05/22/21  5:35 PM  Result Value Ref Range   Glucose-Capillary 181 (H) 70 - 99 mg/dL    Comment: Glucose reference range applies only to samples taken after fasting for at least 8 hours.  Glucose, capillary     Status: Abnormal   Collection Time: 05/22/21  7:34 PM  Result Value Ref Range   Glucose-Capillary 174 (H) 70 - 99 mg/dL    Comment: Glucose reference range applies only to samples taken after fasting for at least 8 hours.  Glucose, capillary     Status: Abnormal   Collection Time: 05/23/21  5:52 AM  Result Value Ref Range   Glucose-Capillary 158 (H) 70 - 99 mg/dL    Comment: Glucose reference range applies only to samples taken after fasting for at least 8 hours.  Glucose, capillary     Status: Abnormal   Collection Time: 05/23/21 12:02 PM  Result Value Ref Range   Glucose-Capillary 125 (H) 70 - 99 mg/dL    Comment: Glucose reference range applies only to samples taken after  fasting for at least 8 hours.  Glucose, capillary     Status: Abnormal   Collection Time: 05/23/21  5:05 PM  Result Value Ref Range   Glucose-Capillary 158 (H) 70 - 99 mg/dL    Comment: Glucose reference range applies only to samples taken after fasting for at least 8 hours.  Glucose, capillary     Status: Abnormal   Collection Time: 05/23/21  7:51 PM  Result Value Ref Range   Glucose-Capillary 155 (H) 70 - 99 mg/dL    Comment: Glucose reference range applies only to samples taken after fasting for at least 8 hours.  Glucose, capillary     Status: Abnormal   Collection Time: 05/24/21  5:40 AM  Result Value Ref Range   Glucose-Capillary 191 (H) 70 - 99 mg/dL    Comment: Glucose reference range applies only to samples taken after fasting for at least 8 hours.  Valproic acid level     Status: None   Collection Time: 05/24/21  6:23 AM  Result Value Ref Range   Valproic Acid Lvl 64 50.0 - 100.0 ug/mL    Comment: Performed at [facility], [place] [address], [place], [place] [address]  TSH     Status: None   Collection Time: 05/24/21  6:23 AM  Result Value Ref Range   TSH 0.408 0.350 - 4.500 uIU/mL    Comment: Performed by a 3rd Generation assay with a functional sensitivity of <=0.01 uIU/mL. Performed at [facility], [place] [address], [place], [place] [address]   T4, free     Status: None   Collection Time: 05/24/21  6:23 AM  Result Value Ref Range   Free T4 1.11 0.61 -  1.12 ng/dL    Comment: (NOTE) Biotin ingestion may interfere with free T4 tests. If the results are inconsistent with the TSH level, previous test results, or the clinical presentation, then consider biotin interference. If needed, order repeat testing after stopping biotin. Performed at [facility], [place] [address], [place], [place] [address]   CBC     Status: Abnormal   Collection Time: 05/24/21  6:23 AM  Result Value Ref Range   WBC 5.2 4.0 - 10.5 K/uL   RBC 4.32 3.87 -  5.11 MIL/uL   Hemoglobin 10.4 (L) 12.0 - 15.0 g/dL   HCT 33.5 (L) 36.0 - 46.0 %   MCV 77.5 (L) 80.0 - 100.0 fL   MCH 24.1 (L) 26.0 - 34.0 pg   MCHC 31.0 30.0 - 36.0 g/dL   RDW 16.7 (H) 11.5 - 15.5 %   Platelets 343 150 - 400 K/uL   nRBC 0.0 0.0 - 0.2 %    Comment: Performed at [facility], [place] [address] [place], [place] [address]  Comprehensive metabolic panel     Status: Abnormal   Collection Time: 05/24/21  6:23 AM  Result Value Ref Range   Sodium 136 135 - 145 mmol/L   Potassium 4.3 3.5 - 5.1 mmol/L   Chloride 105 98 - 111 mmol/L   CO2 23 22 - 32 mmol/L   Glucose, Bld 190 (H) 70 - 99 mg/dL    Comment: Glucose reference range applies only to samples taken after fasting for at least 8 hours.   BUN 10 6 - 20 mg/dL   Creatinine, Ser 0.76 0.44 - 1.00 mg/dL   Calcium 8.7 (L) 8.9 - 10.3 mg/dL   Total Protein 6.4 (L) 6.5 - 8.1 g/dL   Albumin 3.2 (L) 3.5 - 5.0 g/dL   AST 14 (L) 15 - 41 U/L   ALT 18 0 - 44 U/L   Alkaline Phosphatase 58 38 - 126 U/L   Total Bilirubin 0.3 0.3 - 1.2 mg/dL   GFR, Estimated >60 >60 mL/min    Comment: (NOTE) Calculated using the CKD-EPI Creatinine Equation (2021)    Anion gap 8 5 - 15    Comment: Performed at [facility], [place] [address], [place], [place] [address]  Vitamin B12     Status: None   Collection Time: 05/24/21  6:23 AM  Result Value Ref Range   Vitamin B-12 759 180 - 914 pg/mL    Comment: (NOTE) This assay is not validated for testing neonatal or myeloproliferative syndrome specimens for Vitamin B12 levels. Performed at [facility], [place] [address], [place], [place] [address]   VITAMIN D 25 Hydroxy (Vit-D Deficiency, Fractures)     Status: Abnormal   Collection Time: 05/24/21  6:23 AM  Result Value Ref Range   Vit D, 25-Hydroxy 17.60 (L) 30 - 100 ng/mL    Comment: (NOTE) Vitamin D deficiency has been defined by the [facility] practice  guideline as a level of serum 25-OH  vitamin D less than 20 ng/mL (1,2). The Endocrine Society went on to  further define vitamin D insufficiency as a level between 21 and 29  ng/mL (2).  1. IOM (Institute of Medicine). 2010. Dietary reference intakes for  calcium and D. [place]: The [employer]. 2. Holick MF, Binkley Sanford, Bischoff-Ferrari HA, et al. Evaluation,  treatment, and prevention of vitamin D deficiency: an Endocrine  Society clinical practice guideline,  JCEM. 2011 Jul; 96(7): 1911-30.  Performed at [facility], [place] [address], [place], [place] [address]   Glucose, capillary     Status: Abnormal   Collection Time: 05/24/21 12:05 PM  Result Value Ref Range   Glucose-Capillary 148 (H) 70 - 99 mg/dL    Comment: Glucose reference range applies only to samples taken after fasting for at least 8 hours.   Blood Alcohol level:  Lab Results  Component Value Date   ETH <10 05/12/2021   ETH <10 14/43/1540   Metabolic Disorder Labs: Lab Results  Component Value Date   HGBA1C 9.2 (H) 05/12/2021   MPG 217.34 05/12/2021   MPG 209 08/19/2020   Lab Results  Component Value Date   PROLACTIN 11.7 05/17/2018   PROLACTIN 105.5 (H) 02/28/2017   Lab Results  Component Value Date   CHOL 101 05/17/2021   TRIG 65 05/17/2021   HDL 32 (L) 05/17/2021   CHOLHDL 3.2 05/17/2021   VLDL 13 05/17/2021   LDLCALC 56 05/17/2021   LDLCALC 49 08/18/2020   Physical Findings: AIMS: Facial and Oral Movements Muscles of Facial Expression: None, normal Lips and Perioral Area: None, normal Jaw: None, normal Tongue: None, normal,Extremity Movements Upper (arms, wrists, hands, fingers): None, normal Lower (legs, knees, ankles, toes): None, normal,  Overall Severity Severity of abnormal movements (highest score from questions above): None, normal Incapacitation due to abnormal movements: None, normal Patient's awareness of abnormal movements (rate only patient's report): No  Awareness,  Dental Status Current problems with teeth and/or dentures?: No Does patient usually wear dentures?: No  CIWA:    COWS:     Musculoskeletal: Strength & Muscle Tone: within normal limits Gait & Station: normal Patient leans: N/A  Psychiatric Specialty Exam:  Presentation  General Appearance: Casual; Appropriate for Environment  Eye Contact:Good  Speech:Normal  Speech Volume: Normal  Handedness:Right  Mood and Affect  Mood:Anxious; Depressed  Affect: Flat, depressed  Thought Process  Thought Processes:Coherent, tangential  Descriptions of Associations:Tangential  Orientation:Full (Time, Place and Person)  Thought Content:Tangential; Rumination  History of Schizophrenia/Schizoaffective disorder:Yes  Duration of Psychotic Symptoms:Greater than six months  Hallucinations:No patient denies  Ideas of Reference:Paranoia  Suicidal Thoughts:No, patient denies  Homicidal Thoughts: No, patient denies  Sensorium  Memory:Immediate Fair; Recent Fair; Remote Fair  Judgment:Impaired  Insight:Fair  Executive Functions  Concentration:Fair  Attention Span:Fair  [place]  Psychomotor Activity  Psychomotor Activity: Slowed  Assets  Assets:Communication Skills; Desire for Improvement; Financial Resources/Insurance; Housing; Resilience; Social Support  Sleep  Sleep:Poor, 3 hours  Physical Exam: Physical Exam Vitals and nursing note reviewed.  HENT:     Mouth/Throat:     Pharynx: Oropharynx is clear.  Eyes:     Pupils: Pupils are equal, round, and reactive to light.  Cardiovascular:     Pulses: Normal pulses.     Comments: Elevated b/p: 138/91.   Hx. HTN (essential). Patient is currently in no apparent distress. Pulmonary:     Effort: Pulmonary effort is normal.  Genitourinary:    Comments: Deferred Musculoskeletal:        General: Normal range of motion.     Cervical back: Normal range of  motion.  Skin:    General: Skin is warm and dry.  Neurological:     Mental Status: She is alert and oriented to person, place, and time.     Comments: Oriented x 2 (self/situation).   Review of Systems  Constitutional:  Negative for chills,  diaphoresis and fever.  HENT:  Negative for congestion and sore throat.   Eyes:  Negative for blurred vision.  Respiratory:  Negative for cough, shortness of breath and wheezing.   Cardiovascular:  Negative for chest pain and palpitations.  Gastrointestinal:  Negative for abdominal pain, constipation, diarrhea, heartburn, nausea and vomiting.  Genitourinary:  Negative for dysuria.  Musculoskeletal:  Negative for joint pain and myalgias.  Skin: Negative.   Neurological:  Negative for dizziness, tingling, tremors, sensory change, speech change, focal weakness, seizures, loss of consciousness, weakness and headaches.  Psychiatric/Behavioral:  Negative for hallucinations (denies hallucinations), memory loss and suicidal ideas. Substance abuse: Hx. THC use disorder.The patient is not nervous/anxious and does not have insomnia.    Blood pressure 108/76, pulse 92, temperature (!) 97.5 F (36.4 C), temperature source Oral, resp. rate 16, height 5\' 11"  (1.803 m), weight 114.8 kg, SpO2 100 %. Body mass index is 35.29 kg/m.  Treatment Plan Summary: Daily contact with patient to assess and evaluate symptoms and progress in treatment and Medication management.   Continue inpatient hospitalization.  Will continue today 05/24/2021 plan as below except where it is noted.   Diagnoses:  Schizoaffective disorder, bipolar-type.  Cannabis use disorder.  Other medical issues:  Diabetes Mellitus.  HTN (essential).  Plan.  Schizoaffective disorder:  Discontinued Abilify 15 mg po daily on 05/20/2021 Discontinue Haldol 5 mg po bid on 11/21. Increase Risperdal to 2.0 mg PO BID, first dose today at 2000.   Continue Depakote 500 mg po bid.  EPS  prevention. Continue Cogentin to 0.5 mg po bid.  Anxiety.  Continue Vistaril 25 mg po tid prn. Continue Propranolol 10 mg po bid for anxiety/elevated heart rate.  Insomnia.  Continue Trazodone 50 mg po Q hs prn.  UTI:  Continue Bactrim Ds 800-160 mg po bid x 7 days. First dose on 11/18 at 1216.   Vitamin D deficiency:  Initiate Vitamin D 1,000 units daily  Agitation/psychosis.  Continue Olanzapine 10 mg po or IM bid prn. Continue lorazepam 1 mg po Q 6 hrs prn for anxiety.  Other medical issues, continue:  Lipitor 80 mg po daily for high cholesterol.  Insulin glargine-yfgn 16 units subQ daily for DM. Metformin 1,000 mg po bid for diabetes mellitus. Lisinopril 5 mg po daily for HTN. Continue Ferrous sulfate 325 mg po bid for anemia. Continue the sliding scale insulin as recommended per CBG results.  Labs reviewed: CMP with albumin 3.2, AST 14, Total protein 6.4. Vitamin D 17.60. Vitamin B12 759. CBC with H/H 10.4/33.5, MCH 77.5, MCH 24.1, RDW 16.7. Glucose 190. TSH 0.408, Free T4 1.11. Valproic acid level 64.   Labs pending: Vitamin B.   Encourage group attendance/participation. Discharge disposition plan in progress.  Ethelene Hal, NP 05/24/2021, 1:38 PM

## 2021-05-24 NOTE — Progress Notes (Signed)
Pt was encouraged but didn't attend psycho-ed group.

## 2021-05-24 NOTE — Progress Notes (Signed)
Pt visible on the unit, pt less confused and less disorganized this evening. Pt appeared coherent this evening and even able to make decisions. Pt able to tell a story about how she works from home. Pt given HS medications, pt encouraged to let staff know if she was having issue sleeping and she could take PRN medication for sleep    05/24/21 2200  Psych Admission Type (Psych Patients Only)  Admission Status Involuntary  Psychosocial Assessment  Patient Complaints Worrying  Eye Contact Staring  Facial Expression Wide-eyed  Affect Anxious  Speech Logical/coherent  Interaction Assertive  Motor Activity Slow  Appearance/Hygiene In scrubs  Behavior Characteristics Cooperative  Mood Suspicious  Aggressive Behavior  Effect No apparent injury  Thought Process  Coherency WDL  Content Preoccupation;Paranoia  Delusions Paranoid  Perception Derealization  Hallucination None reported or observed  Judgment Poor  Confusion Moderate  Danger to Self  Current suicidal ideation? Denies  Danger to Others  Danger to Others None reported or observed

## 2021-05-24 NOTE — Progress Notes (Signed)
Adult Psychoeducational Group Note  Date:  05/24/2021 Time:  8:24 PM  Group Topic/Focus:  Wrap-Up Group:   The focus of this group is to help patients review their daily goal of treatment and discuss progress on daily workbooks.  Participation Level:  Active  Participation Quality:  Appropriate  Affect:  Appropriate  Cognitive:  Appropriate  Insight: Appropriate  Engagement in Group:  Engaged  Modes of Intervention:  Discussion  Additional Comments:  Pt stated her goal for today was to focus on her treatment plan. Pt stated she accomplished her goal today. Pt stated she talked with her doctor and her social worker about her care today. Pt rated her overall day a 9 out of 10. Pt stated she was able to contact her cousin, and her sister today which improved her overall day. Pt stated she felt better about herself tonight.  Pt stated she was able to attend all meals. Pt stated she took all medications provided today. Pt stated her appetite was pretty good today. Pt rated her sleep last night as fair. Pt stated the goal tonight was to get some rest. Pt stated she had no physical pain tonight. Pt deny visual hallucinations and auditory issues tonight. Pt denies thoughts of harming herself or others. Pt stated she would alert staff if anything changed.  Candy Sledge 05/24/2021, 8:24 PM

## 2021-05-24 NOTE — Progress Notes (Signed)
CSW met with [name] and CSW and [name] called sister and family friend with patient consent.  [name] was paranoid when speaking with family members.  [name] assumed she was being discharged today and became upset when she learned that she was not being discharged.  Patient hung up on sister without ability for CSW and [name] to speak about discharge planning.  CSW also assisted patient call support, [name], who reported she was working on assisting patient to get to [place] and then get connected with a CST team in [place], [place]. [name] would not let support discuss specific names or phone numbers of supports that would be willing to [place] when she arrived to [place]. Towards the end of the call, patient became worried that her daughter was screaming for her in the hallway and had to end the phone call abruptly.  Patient gave this CSW consent to talk with [name] on her own about discharge planning.   CSW called [name] later and she explained that they are hoping a cousin from [place] can pick patient up when she is ready for discharge and the cousin can drive patient to [place] to stay with a family member there while she gets connected with a CST team in [place].  [name] hopes that they can assist her with finding housing while on the CST team.  [name] requested that we stay in communication about discharge planning so that they can try to coordinate appropriately.    Jamelle Noy, LCSW, [place] Social Worker  [facility]

## 2021-05-24 NOTE — Progress Notes (Signed)
CSW met with patient to discuss discharge.  CSW informed patient that from information that we have been provided from mother, patient may not be able to go back to sister's place to stay.  Patient feels like issues have been resolved and should be able to return.  Patient continues to be paranoid and whispers contact information to this social worker because she is worried people are listening to her. Patient provided this CSW consent to speak with her sister, [name].  CSW encouraged patient to call sister first to learn what her options are and to report back to CSW prior to CSW calling [name] for safety planning.    CSW provided patient with shelter resources in case patient unable to return back to sister.  Patient reports that she has other contacts in her phone and does not feel like shelter resources are needed.  Patient reports confidence that she has someone else that she can stay with if her sister doesn't allow her to come back.  Patient was unable to provide specific contacts at this time.   Shaundra Fullam, LCSW, [place] Social Worker  [facility]

## 2021-05-24 NOTE — Progress Notes (Signed)
DAR NOTE: Patient presents with a flat affect and depressed mood.  Patient observed pacing the hallway preoccupied.  Still paranoid about peers listening to her phone conversation and talking about her.  Needed a lot of redirection on the unit.  Denies suicidal thoughts, pain, auditory and visual hallucinations.  Rates depression at 0, hopelessness at 0, and anxiety at 3.  Maintained on routine safety checks.  Medications given as prescribed.  Support and encouragement offered as needed.  States goal for today is "discharge planning and staying on track."  Ativan 1 mg given for anxiety with good effect.  Patient is safe on the unit.

## 2021-05-25 DIAGNOSIS — F250 Schizoaffective disorder, bipolar type: Secondary | ICD-10-CM | POA: Diagnosis not present

## 2021-05-25 LAB — GLUCOSE, CAPILLARY
Glucose-Capillary: 127 mg/dL — ABNORMAL HIGH (ref 70–99)
Glucose-Capillary: 234 mg/dL — ABNORMAL HIGH (ref 70–99)
Glucose-Capillary: 173 mg/dL — ABNORMAL HIGH (ref 70–99)
Glucose-Capillary: 180 mg/dL — ABNORMAL HIGH (ref 70–99)

## 2021-05-25 MED ORDER — LORAZEPAM 0.5 MG PO TABS
0.5000 mg | ORAL_TABLET | Freq: Three times a day (TID) | ORAL | Status: DC
  Administered 2021-05-25 – 2021-05-29 (×12): 0.5 mg via ORAL
  Filled 2021-05-25 (×13): qty 1

## 2021-05-25 MED ORDER — RISPERIDONE 2 MG PO TBDP
2.5000 mg | ORAL_TABLET | Freq: Two times a day (BID) | ORAL | Status: DC
  Administered 2021-05-25 – 2021-05-26 (×2): 2.5 mg via ORAL
  Filled 2021-05-25 (×7): qty 1

## 2021-05-25 NOTE — Group Note (Signed)
LCSW Aftercare Discharge Planning Group Note   Type of Group and Topic: Psychoeducational Group: Discharge Planning  Participation Level: Minimal  Description of Group: Discharge planning group reviews patient's anticipated discharge plans and assists patients to anticipate and address any barriers to wellness/recovery in the community. Suicide prevention education is reviewed with patients in group.  Therapeutic Goals:  1. Patients will state their anticipated discharge plan and mental health aftercare  2. Patients will identify potential barriers to wellness in the community setting  3. Patients will engage in problem solving, solution focused discussion of ways to anticipate and address barriers to wellness/recovery  Summary of Patient Progress: Patient attended group and offered some participation during group discussion.    Supports: Patient shares that the elderly family members in her life are her supports and motivations.   Therapeutic Modalities: Motivational Interviewing

## 2021-05-25 NOTE — Progress Notes (Signed)
CSW spoke with patient sister, Aleasha Fregeau, who discussed discharge planning with CSW.  At this time, family is hoping that when patient is discharged that her cousin, Lorrene Reid, will be able to pick patient up and bring her back to [place].  Family is hoping to connect patient with a CST team to assist with patient mental health care. CSW provided support with information on how to apply for disability for patient and family agreed to send this CSW via email contact information for the CST team that they are hoping to connect patient with.   Family will need to be informed prior to discharge about discharge date and plans so that they can coordinate transportation and support for patient.  CSW agreed to send family information about applying for disability via email and to send appropriate referrals to organizations supports wish to connect patient to.   Horris Speros, LCSW, [place] Social Worker  [facility]

## 2021-05-25 NOTE — Progress Notes (Signed)
Did not attend group 

## 2021-05-25 NOTE — Progress Notes (Signed)
Adult Psychoeducational Group Note  Date:  05/25/2021 Time:  7:59 PM  Group Topic/Focus:  Wrap-Up Group:   The focus of this group is to help patients review their daily goal of treatment and discuss progress on daily workbooks.  Participation Level:  Active  Participation Quality:  Appropriate  Affect:  Appropriate  Cognitive:  Appropriate  Insight: Appropriate  Engagement in Group:  Engaged  Modes of Intervention:  Discussion  Additional Comments:  Pt stated her goal for today was to focus on her treatment plan. Pt stated she accomplished her goal today. Pt stated she talked with her doctor and her social worker about her care today. Pt rated her overall day a 10. Pt stated she was able to contact her cousin, mother, and her sister today which improved her overall day. Pt stated she felt better about herself tonight.  Pt stated she was able to attend all meals. Pt stated she took all medications provided today. Pt stated her appetite was pretty good today. Pt rated her sleep last night was pretty good. Pt stated the goal tonight was to get some rest. Pt stated she had no physical pain tonight. Pt deny visual hallucinations and auditory issues tonight. Pt denies thoughts of harming herself or others. Pt stated she would alert staff if anything changed.  Candy Sledge 05/25/2021, 7:59 PM

## 2021-05-25 NOTE — Progress Notes (Signed)
[facility] MD Progress Note  05/25/2021 9:23 AM [name]  MRN:  [id number]  Subjective: [name] reports "I feel okay really tired today. I think I am on too much medication"   Evaluation on the unit today 11/21:  Patient is seen, chart reviewed and case discussed with the treatment team. Her affect is flat. Her grooming and hygiene are adequate. She continues to struggle with paranoia. She stated she feels very tired today and she feels she is on too much medication. We discussed the change to Risperdal and the start of Seroquel.  She does appear to be tired. There is no cog-wheeling noted. She stated she does feel her paranoid thoughts are better than when she came in. She is able to be on topic for a short time but then starts talking about hearing [name] on the phone giving out peoples information. She stated "he was on the new patient as soon as he got here but I told him not to give out his information." She has a very real (to her) fear of writing her information down and someone coming into her room to steal it. She stated she does not come out of her room much because if she stays around people a fight might break out. She is worried about some of the patient's behaviors on the unit. She then will talk about her clothes and how she usually dresses very "fancy." She is grandiose and mistrusting at the same time.  Reviewed vital signs; BP 103/67, Pulse 106. She reported good sleep, record reflects she slept 6.75 hours. We will increase her Risperdal to 2.5 mg BID to better target her paranoia. Her Ativan will be decreased to 0.5 mg TID to address the sedation she is feeling. We will continue to monitor for safety and titrate medications as needed.  Reason for admission: 45 year old female with a reported psychiatric history of "schizoaffective, bipolar, and personality" who was admitted to this psychiatric unit for disorientation, confusion, hallucinations, suicidal thoughts, and delusions. During the her  admission interview, about details of her HPI.  She reports that she was driving from [name], to [name], realized that she was not thinking clearly and confused, and went to the [name] for evaluation.    Principal Problem: Schizoaffective disorder, bipolar type ([name])  Diagnosis: Principal Problem:   Schizoaffective disorder, bipolar type ([name]) Active Problems:   Cannabis use disorder, moderate, dependence ([name])  Total Time spent with patient:  25 minutes  Past Psychiatric History: Schizoaffective disorder, bipolar-type.  Past Medical History:  Past Medical History:  Diagnosis Date   Abnormal Pap smear    Bipolar 1 disorder ([name])    Diabetes mellitus without complication (HCC)    Fibroids    Hypertension    IBS (irritable bowel syndrome)     Past Surgical History:  Procedure Laterality Date   CERVICAL BIOPSY     CHOLECYSTECTOMY     ESSURE TUBAL LIGATION     HIATAL HERNIA REPAIR     TUBAL LIGATION     Family History:  Family History  Problem Relation Age of Onset   Diabetes Father    Diabetes Paternal Grandmother    Diabetes Maternal Grandmother    Mental illness Cousin    Healthy Mother    Family Psychiatric  History: See H&P.  Social History:  Social History   Substance and Sexual Activity  Alcohol Use Not Currently   Comment: occasional     Social History   Substance and Sexual Activity  Drug Use Not Currently    Social History   Socioeconomic History   Marital status: Single    Spouse name: Not on file   Number of children: 2   Years of education: Not on file   Highest education level: Master's degree (e.g., MA, MS, MEng, MEd, MSW, MBA)  Occupational History   Not on file  Tobacco Use   Smoking status: Former    Types: Cigarettes   Smokeless tobacco: Never  Vaping Use   Vaping Use: Some days   Substances: Nicotine, Flavoring  Substance and Sexual Activity   Alcohol use: Not Currently    Comment: occasional   Drug use:  Not Currently   Sexual activity: Yes    Birth control/protection: Surgical  Other Topics Concern   Not on file  Social History Narrative   ** Merged History Encounter **       Social Determinants of Health   Financial Resource Strain: Not on file  Food Insecurity: Not on file  Transportation Needs: Not on file  Physical Activity: Not on file  Stress: Not on file  Social Connections: Not on file   Additional Social History:   Sleep:  6.5  Appetite:  Good  Current Medications: Current Facility-Administered Medications  Medication Dose Route Frequency Provider Last Rate Last Admin   acetaminophen (TYLENOL) tablet 650 mg  650 mg Oral Q4H PRN Lindell Spar I, NP   650 mg at 05/20/21 1732   atorvastatin (LIPITOR) tablet 80 mg  80 mg Oral Daily Rankin, Shuvon B, NP   80 mg at 05/24/21 0758   divalproex (DEPAKOTE) DR tablet 500 mg  500 mg Oral BID Lindell Spar I, NP   500 mg at 05/24/21 1727   docusate sodium (COLACE) capsule 100 mg  100 mg Oral Daily Lindell Spar I, NP   100 mg at 05/24/21 0802   ferrous sulfate tablet 325 mg  325 mg Oral BID WC Nwoko, Herbert Pun I, NP   325 mg at 05/24/21 1725   hydrOXYzine (ATARAX/VISTARIL) tablet 25 mg  25 mg Oral TID PRN Prescilla Sours, PA-C   25 mg at 05/23/21 2235   insulin aspart (novoLOG) injection 0-5 Units  0-5 Units Subcutaneous QHS Prescilla Sours, PA-C   3 Units at 05/16/21 2254   insulin aspart (novoLOG) injection 0-9 Units  0-9 Units Subcutaneous TID WC Rankin, Shuvon B, NP   1 Units at 05/25/21 0622   insulin glargine-yfgn (SEMGLEE) injection 16 Units  16 Units Subcutaneous Daily Massengill, Ovid Curd, MD   16 Units at 05/24/21 0757   lisinopril (ZESTRIL) tablet 5 mg  5 mg Oral Daily Rankin, Shuvon B, NP   5 mg at 05/24/21 0757   LORazepam (ATIVAN) tablet 1 mg  1 mg Oral Q6H PRN Lindell Spar I, NP   1 mg at 05/24/21 1516   LORazepam (ATIVAN) tablet 1 mg  1 mg Oral BID Ethelene Hal, NP   1 mg at 05/24/21 2055   metFORMIN (GLUCOPHAGE)  tablet 1,000 mg  1,000 mg Oral BID WC Rankin, Shuvon B, NP   1,000 mg at 05/24/21 1726   OLANZapine (ZYPREXA) injection 10 mg  10 mg Intramuscular BID PRN Briant Cedar, MD       OLANZapine (ZYPREXA) tablet 10 mg  10 mg Oral BID PRN Briant Cedar, MD   10 mg at 05/21/21 1618   propranolol (INDERAL) tablet 10 mg  10 mg Oral BID Massengill, Ovid Curd, MD   10 mg at  05/24/21 1725   QUEtiapine (SEROQUEL) tablet 50 mg  50 mg Oral QHS Ethelene Hal, NP   50 mg at 05/24/21 2056   risperiDONE (RISPERDAL M-TABS) disintegrating tablet 2 mg  2 mg Oral BID Ethelene Hal, NP   2 mg at 05/24/21 2055   sulfamethoxazole-trimethoprim (BACTRIM DS) 800-160 MG per tablet 1 tablet  1 tablet Oral Q12H Lindell Spar I, NP   1 tablet at 05/24/21 2056   traZODone (DESYREL) tablet 50 mg  50 mg Oral QHS PRN Prescilla Sours, PA-C   50 mg at 05/23/21 2003   Vitamin D3 (Vitamin D) tablet 1,000 Units  1,000 Units Oral Daily Ethelene Hal, NP   1,000 Units at 05/24/21 1724   Lab Results:  Results for orders placed or performed during the hospital encounter of 05/16/21 (from the past 48 hour(s))  Glucose, capillary     Status: Abnormal   Collection Time: 05/23/21 12:02 PM  Result Value Ref Range   Glucose-Capillary 125 (H) 70 - 99 mg/dL    Comment: Glucose reference range applies only to samples taken after fasting for at least 8 hours.  Glucose, capillary     Status: Abnormal   Collection Time: 05/23/21  5:05 PM  Result Value Ref Range   Glucose-Capillary 158 (H) 70 - 99 mg/dL    Comment: Glucose reference range applies only to samples taken after fasting for at least 8 hours.  Glucose, capillary     Status: Abnormal   Collection Time: 05/23/21  7:51 PM  Result Value Ref Range   Glucose-Capillary 155 (H) 70 - 99 mg/dL    Comment: Glucose reference range applies only to samples taken after fasting for at least 8 hours.  Glucose, capillary     Status: Abnormal   Collection Time: 05/24/21   5:40 AM  Result Value Ref Range   Glucose-Capillary 191 (H) 70 - 99 mg/dL    Comment: Glucose reference range applies only to samples taken after fasting for at least 8 hours.  Valproic acid level     Status: None   Collection Time: 05/24/21  6:23 AM  Result Value Ref Range   Valproic Acid Lvl 64 50.0 - 100.0 ug/mL    Comment: Performed at [facility], [place] [address], [place], [place] [address]  TSH     Status: None   Collection Time: 05/24/21  6:23 AM  Result Value Ref Range   TSH 0.408 0.350 - 4.500 uIU/mL    Comment: Performed by a 3rd Generation assay with a functional sensitivity of <=0.01 uIU/mL. Performed at [facility], [place] [address] [place], [place] [address]   T4, free     Status: None   Collection Time: 05/24/21  6:23 AM  Result Value Ref Range   Free T4 1.11 0.61 - 1.12 ng/dL    Comment: (NOTE) Biotin ingestion may interfere with free T4 tests. If the results are inconsistent with the TSH level, previous test results, or the clinical presentation, then consider biotin interference. If needed, order repeat testing after stopping biotin. Performed at [facility], [place] [address], [place] [address]   CBC     Status: Abnormal   Collection Time: 05/24/21  6:23 AM  Result Value Ref Range   WBC 5.2 4.0 - 10.5 K/uL   RBC 4.32 3.87 - 5.11 MIL/uL   Hemoglobin 10.4 (L) 12.0 - 15.0 g/dL   HCT 33.5 (L) 36.0 - 46.0 %  MCV 77.5 (L) 80.0 - 100.0 fL   MCH 24.1 (L) 26.0 - 34.0 pg   MCHC 31.0 30.0 - 36.0 g/dL   RDW 16.7 (H) 11.5 - 15.5 %   Platelets 343 150 - 400 K/uL   nRBC 0.0 0.0 - 0.2 %    Comment: Performed at [facility], [place] [address] [place], [place] [address]  Comprehensive metabolic panel     Status: Abnormal   Collection Time: 05/24/21  6:23 AM  Result Value Ref Range   Sodium 136 135 - 145 mmol/L   Potassium 4.3 3.5 - 5.1 mmol/L   Chloride 105 98 - 111 mmol/L   CO2 23 22 - 32 mmol/L    Glucose, Bld 190 (H) 70 - 99 mg/dL    Comment: Glucose reference range applies only to samples taken after fasting for at least 8 hours.   BUN 10 6 - 20 mg/dL   Creatinine, Ser 0.76 0.44 - 1.00 mg/dL   Calcium 8.7 (L) 8.9 - 10.3 mg/dL   Total Protein 6.4 (L) 6.5 - 8.1 g/dL   Albumin 3.2 (L) 3.5 - 5.0 g/dL   AST 14 (L) 15 - 41 U/L   ALT 18 0 - 44 U/L   Alkaline Phosphatase 58 38 - 126 U/L   Total Bilirubin 0.3 0.3 - 1.2 mg/dL   GFR, Estimated >60 >60 mL/min    Comment: (NOTE) Calculated using the CKD-EPI Creatinine Equation (2021)    Anion gap 8 5 - 15    Comment: Performed at [facility], [place] [address] [place], [place] [address]  Vitamin B12     Status: None   Collection Time: 05/24/21  6:23 AM  Result Value Ref Range   Vitamin B-12 759 180 - 914 pg/mL    Comment: (NOTE) This assay is not validated for testing neonatal or myeloproliferative syndrome specimens for Vitamin B12 levels. Performed at [facility], [place] [address], [place], [place] [address]   VITAMIN D 25 Hydroxy (Vit-D Deficiency, Fractures)     Status: Abnormal   Collection Time: 05/24/21  6:23 AM  Result Value Ref Range   Vit D, 25-Hydroxy 17.60 (L) 30 - 100 ng/mL    Comment: (NOTE) Vitamin D deficiency has been defined by the [place] practice guideline as a level of serum 25-OH  vitamin D less than 20 ng/mL (1,2). The Endocrine Society went on to  further define vitamin D insufficiency as a level between 21 and 29  ng/mL (2).  1. IOM (Institute of Medicine). 2010. Dietary reference intakes for  calcium and D. [place]: The [employer]. 2. Holick MF, Binkley [place], Bischoff-Ferrari HA, et al. Evaluation,  treatment, and prevention of vitamin D deficiency: an Endocrine  Society clinical practice guideline, JCEM. 2011 Jul; 96(7): 1911-30.  Performed at [facility], [place] [address], [place],  [place] [address]   Glucose, capillary     Status: Abnormal   Collection Time: 05/24/21 12:05 PM  Result Value Ref Range   Glucose-Capillary 148 (H) 70 - 99 mg/dL    Comment: Glucose reference range applies only to samples taken after fasting for at least 8 hours.  Glucose, capillary     Status: Abnormal   Collection Time: 05/24/21  5:17 PM  Result Value Ref Range   Glucose-Capillary 133 (H) 70 - 99 mg/dL    Comment: Glucose reference range applies only to samples taken after fasting  for at least 8 hours.  Glucose, capillary     Status: Abnormal   Collection Time: 05/24/21  7:34 PM  Result Value Ref Range   Glucose-Capillary 164 (H) 70 - 99 mg/dL    Comment: Glucose reference range applies only to samples taken after fasting for at least 8 hours.  Glucose, capillary     Status: Abnormal   Collection Time: 05/25/21  5:42 AM  Result Value Ref Range   Glucose-Capillary 127 (H) 70 - 99 mg/dL    Comment: Glucose reference range applies only to samples taken after fasting for at least 8 hours.   Blood Alcohol level:  Lab Results  Component Value Date   ETH <10 05/12/2021   ETH <10 33/29/5188   Metabolic Disorder Labs: Lab Results  Component Value Date   HGBA1C 9.2 (H) 05/12/2021   MPG 217.34 05/12/2021   MPG 209 08/19/2020   Lab Results  Component Value Date   PROLACTIN 11.7 05/17/2018   PROLACTIN 105.5 (H) 02/28/2017   Lab Results  Component Value Date   CHOL 101 05/17/2021   TRIG 65 05/17/2021   HDL 32 (L) 05/17/2021   CHOLHDL 3.2 05/17/2021   VLDL 13 05/17/2021   LDLCALC 56 05/17/2021   LDLCALC 49 08/18/2020   Physical Findings: AIMS: Facial and Oral Movements Muscles of Facial Expression: None, normal Lips and Perioral Area: None, normal Jaw: None, normal Tongue: None, normal,Extremity Movements Upper (arms, wrists, hands, fingers): None, normal Lower (legs, knees, ankles, toes): None, normal,  Overall Severity Severity of abnormal movements (highest score from  questions above): None, normal Incapacitation due to abnormal movements: None, normal Patient's awareness of abnormal movements (rate only patient's report): No Awareness,  Dental Status Current problems with teeth and/or dentures?: No Does patient usually wear dentures?: No  CIWA:    COWS:     Musculoskeletal: Strength & Muscle Tone: within normal limits Gait & Station: normal Patient leans: N/A  Psychiatric Specialty Exam:  Presentation  General Appearance: Casual; Appropriate for Environment  Eye Contact:Good  Speech:Normal  Speech Volume: Normal  Handedness:Right  Mood and Affect  Mood:Anxious; Depressed  Affect: Flat, depressed  Thought Process  Thought Processes:Coherent, tangential  Descriptions of Associations:Tangential  Orientation:Full (Time, Place and Person)  Thought Content:Tangential; Rumination  History of Schizophrenia/Schizoaffective disorder:Yes  Duration of Psychotic Symptoms:Greater than six months  Hallucinations:No patient denies  Ideas of Reference:Paranoia  Suicidal Thoughts:No, patient denies  Homicidal Thoughts: No, patient denies  Sensorium  Memory:Immediate Fair; Recent Fair; Remote Fair  Judgment:Impaired  Insight:Fair  Executive Functions  Concentration:Fair  Attention Span:Fair  [place]  Psychomotor Activity  Psychomotor Activity: Slowed  Assets  Assets:Communication Skills; Desire for Improvement; Financial Resources/Insurance; Housing; Resilience; Social Support  Sleep  Sleep:Poor, 3 hours  Physical Exam: Physical Exam Vitals and nursing note reviewed.  HENT:     Mouth/Throat:     Pharynx: Oropharynx is clear.  Eyes:     Pupils: Pupils are equal, round, and reactive to light.  Cardiovascular:     Pulses: Normal pulses.     Comments: Elevated b/p: 138/91.   Hx. HTN (essential). Patient is currently in no apparent distress. Pulmonary:     Effort:  Pulmonary effort is normal.  Genitourinary:    Comments: Deferred Musculoskeletal:        General: Normal range of motion.     Cervical back: Normal range of motion.  Skin:    General: Skin is warm and dry.  Neurological:  Mental Status: She is alert and oriented to person, place, and time.     Comments: Oriented x 2 (self/situation).   Review of Systems  Constitutional:  Negative for chills, diaphoresis and fever.  HENT:  Negative for congestion and sore throat.   Eyes:  Negative for blurred vision.  Respiratory:  Negative for cough, shortness of breath and wheezing.   Cardiovascular:  Negative for chest pain and palpitations.  Gastrointestinal:  Negative for abdominal pain, constipation, diarrhea, heartburn, nausea and vomiting.  Genitourinary:  Negative for dysuria.  Musculoskeletal:  Negative for joint pain and myalgias.  Skin: Negative.   Neurological:  Negative for dizziness, tingling, tremors, sensory change, speech change, focal weakness, seizures, loss of consciousness, weakness and headaches.  Psychiatric/Behavioral:  Negative for hallucinations (denies hallucinations), memory loss and suicidal ideas. Substance abuse: Hx. THC use disorder.The patient is not nervous/anxious and does not have insomnia.    Blood pressure 103/67, pulse (!) 106, temperature 98.4 F (36.9 C), temperature source Oral, resp. rate 16, height 5\' 11"  (1.803 m), weight 114.8 kg, SpO2 100 %. Body mass index is 35.29 kg/m.  Treatment Plan Summary: Daily contact with patient to assess and evaluate symptoms and progress in treatment and Medication management.   Continue inpatient hospitalization.  Will continue today 05/25/2021 plan as below except where it is noted.   Diagnoses:  Schizoaffective disorder, bipolar-type.  Cannabis use disorder.  Other medical issues:  Diabetes Mellitus.  HTN (essential).  Plan.  Schizoaffective disorder:  Discontinued Abilify 15 mg po daily on  05/20/2021 Discontinue Haldol 5 mg po bid on 11/21. Increase Risperdal to 2.5 mg PO BID, first dose today at 2000.  Continue Depakote 500 mg po bid.  EPS prevention. Continue Cogentin to 0.5 mg po bid.  Anxiety.  Decrease Ativan to 0.5 mg PO TID, schedule 8A/2P/8P Continue Vistaril 25 mg po tid prn. Continue Propranolol 10 mg po bid for anxiety/elevated heart rate.  Insomnia.  Continue Trazodone 50 mg po Q hs prn.  UTI:  Continue Bactrim Ds 800-160 mg po bid x 7 days. First dose on 11/18 at 1216.   Vitamin D deficiency:  Initiate Vitamin D 1,000 units daily  Agitation/psychosis.  Continue Olanzapine 10 mg po or IM bid prn. Continue lorazepam 1 mg po Q 6 hrs prn for anxiety.  Other medical issues, continue:  Lipitor 80 mg po daily for high cholesterol.  Insulin glargine-yfgn 16 units subQ daily for DM. Metformin 1,000 mg po bid for diabetes mellitus. Lisinopril 5 mg po daily for HTN. Continue Ferrous sulfate 325 mg po bid for anemia. Continue the sliding scale insulin as recommended per CBG results.  Labs reviewed: CMP with albumin 3.2, AST 14, Total protein 6.4. Vitamin D 17.60. Vitamin B12 759. CBC with H/H 10.4/33.5, MCH 77.5, MCH 24.1, RDW 16.7. Glucose 190. TSH 0.408, Free T4 1.11. Valproic acid level 64.   Labs pending: Vitamin B.   Encourage group attendance/participation. Discharge disposition plan in progress.  Ethelene Hal, NP 05/25/2021, 12:47 PM

## 2021-05-25 NOTE — Progress Notes (Signed)
Group note  Pt's participated in guided imagery group for relaxation. Pt's expressed and discussed how they may be able to use this once discharged for coping and progression with their mental health.   Pt attended group and was appropriate

## 2021-05-25 NOTE — Progress Notes (Signed)
Pt visible interacting on the unit appropriately, pt vocal with proper responses. Pt stated she was tired earlier , but got better throughout the day.     05/25/21 2100  Psych Admission Type (Psych Patients Only)  Admission Status Involuntary  Psychosocial Assessment  Patient Complaints Worrying;Anxiety  Eye Contact Fair  Facial Expression Wide-eyed  Affect Anxious  Speech Slow  Interaction Forwards little;Minimal  Motor Activity Slow (very slow in ambulation)  Appearance/Hygiene Unremarkable  Behavior Characteristics Cooperative  Mood Pleasant;Anxious  Aggressive Behavior  Effect No apparent injury  Thought Process  Coherency Blocking  Content Preoccupation;Paranoia  Delusions Paranoid  Perception Derealization  Hallucination None reported or observed  Judgment Poor  Confusion Mild  Danger to Self  Current suicidal ideation? Denies  Danger to Others  Danger to Others None reported or observed

## 2021-05-26 LAB — GLUCOSE, CAPILLARY
Glucose-Capillary: 142 mg/dL — ABNORMAL HIGH (ref 70–99)
Glucose-Capillary: 127 mg/dL — ABNORMAL HIGH (ref 70–99)
Glucose-Capillary: 234 mg/dL — ABNORMAL HIGH (ref 70–99)
Glucose-Capillary: 167 mg/dL — ABNORMAL HIGH (ref 70–99)

## 2021-05-26 MED ORDER — WHITE PETROLATUM EX OINT
TOPICAL_OINTMENT | CUTANEOUS | Status: AC
  Administered 2021-05-26: 1
  Filled 2021-05-26: qty 5

## 2021-05-26 MED ORDER — RISPERIDONE 3 MG PO TBDP
3.0000 mg | ORAL_TABLET | Freq: Two times a day (BID) | ORAL | Status: DC
  Administered 2021-05-26 – 2021-05-28 (×4): 3 mg via ORAL
  Filled 2021-05-26 (×9): qty 1

## 2021-05-26 NOTE — [facility] Group Notes (Signed)
Pt didn't attend group. 

## 2021-05-26 NOTE — [facility] Group Notes (Signed)
[facility] Group Notes:  (Nursing/MHT/Case Management/Adjunct)  Date:  05/26/2021  Time:  10:38 AM  Type of Therapy:   Orientation/Goals group  Participation Level:  Active  Participation Quality:  Appropriate  Affect:  Appropriate  Cognitive:  Alert and Appropriate  Insight:  Appropriate  Engagement in Group:  Engaged  Modes of Intervention:  Discussion, Education, Orientation, and Support  Summary of Progress/Problems: pt goal for today is to find out discharge plan and medication management.   [name] J Saaya Procell 05/26/2021, 10:38 AM

## 2021-05-26 NOTE — Progress Notes (Signed)
[facility] MD Progress Note  05/26/2021 2:41 PM [name]  MRN:  [id number]  Subjective: [name] reports "I'm doing well today. It is going pretty well. I'm having a good morning so far today. I have no depression or anxiety today". Evaluation on the unit today 05/25/21:  Patient is seen in her room, chart reviewed, case discussed with the treatment team. [name] presents with a much improved/reactive affect, good eye contact. She is verbally responsive. She is well groomed. She reports doing well. Denies any symptoms of depression or anxiety. She denies AVH, delusional thoughts or paranoia. She does not appear to to be responding to any internal stimuli. Reviewed vital signs; B/P slightly low 127/56 , Pulse 93. She reports sleeping well last night, record reflects she slept 7.75 hours. Recent medication changes & dose adjustments to meet patient needs. Tolerating treatment regimen without any side effects reported. We will continue to monitor for safety & titrate medications as needed. Patient is participating in the group sessions. We will continue current plan of care as already in progress.  Reason for admission: 45 year old female with a reported psychiatric history of "schizoaffective, bipolar, and personality" who was admitted to this psychiatric unit for disorientation, confusion, hallucinations, suicidal thoughts, and delusions. During the her admission interview, about details of her HPI.  She reports that she was driving from [name], to [name], realized that she was not thinking clearly and confused, and went to the [facility] for evaluation.    Principal Problem: Schizoaffective disorder, bipolar type ([place])  Diagnosis: Principal Problem:   Schizoaffective disorder, bipolar type ([place]) Active Problems:   Cannabis use disorder, moderate, dependence ([place])  Total Time spent with patient:  25 minutes  Past Psychiatric History: Schizoaffective disorder, bipolar-type.  Past Medical  History:  Past Medical History:  Diagnosis Date   Abnormal Pap smear    Bipolar 1 disorder ([place])    Diabetes mellitus without complication (HCC)    Fibroids    Hypertension    IBS (irritable bowel syndrome)     Past Surgical History:  Procedure Laterality Date   CERVICAL BIOPSY     CHOLECYSTECTOMY     ESSURE TUBAL LIGATION     HIATAL HERNIA REPAIR     TUBAL LIGATION     Family History:  Family History  Problem Relation Age of Onset   Diabetes Father    Diabetes Paternal Grandmother    Diabetes Maternal Grandmother    Mental illness Cousin    Healthy Mother    Family Psychiatric  History: See H&P.  Social History:  Social History   Substance and Sexual Activity  Alcohol Use Not Currently   Comment: occasional     Social History   Substance and Sexual Activity  Drug Use Not Currently    Social History   Socioeconomic History   Marital status: Single    Spouse name: Not on file   Number of children: 2   Years of education: Not on file   Highest education level: Master's degree (e.g., MA, MS, MEng, MEd, MSW, MBA)  Occupational History   Not on file  Tobacco Use   Smoking status: Former    Types: Cigarettes   Smokeless tobacco: Never  Vaping Use   Vaping Use: Some days   Substances: Nicotine, Flavoring  Substance and Sexual Activity   Alcohol use: Not Currently    Comment: occasional   Drug use: Not Currently   Sexual activity: Yes    Birth control/protection: Surgical  Other  Topics Concern   Not on file  Social History Narrative   ** Merged History Encounter **       Social Determinants of Health   Financial Resource Strain: Not on file  Food Insecurity: Not on file  Transportation Needs: Not on file  Physical Activity: Not on file  Stress: Not on file  Social Connections: Not on file   Additional Social History:   Sleep:  6.5  Appetite:  Good  Current Medications: Current Facility-Administered Medications  Medication Dose Route  Frequency Provider Last Rate Last Admin   acetaminophen (TYLENOL) tablet 650 mg  650 mg Oral Q4H PRN Lindell Spar I, NP   650 mg at 05/20/21 1732   atorvastatin (LIPITOR) tablet 80 mg  80 mg Oral Daily Rankin, Shuvon B, NP   80 mg at 05/26/21 0814   divalproex (DEPAKOTE) DR tablet 500 mg  500 mg Oral BID Lindell Spar I, NP   500 mg at 05/26/21 0814   docusate sodium (COLACE) capsule 100 mg  100 mg Oral Daily Lindell Spar I, NP   100 mg at 05/26/21 0810   ferrous sulfate tablet 325 mg  325 mg Oral BID WC Eleanor Gatliff, Herbert Pun I, NP   325 mg at 05/26/21 0811   hydrOXYzine (ATARAX/VISTARIL) tablet 25 mg  25 mg Oral TID PRN Prescilla Sours, PA-C   25 mg at 05/23/21 2235   insulin aspart (novoLOG) injection 0-5 Units  0-5 Units Subcutaneous QHS Prescilla Sours, PA-C   3 Units at 05/16/21 2254   insulin aspart (novoLOG) injection 0-9 Units  0-9 Units Subcutaneous TID WC Rankin, Shuvon B, NP   1 Units at 05/26/21 1259   insulin glargine-yfgn (SEMGLEE) injection 16 Units  16 Units Subcutaneous Daily Massengill, Ovid Curd, MD   16 Units at 05/26/21 0815   lisinopril (ZESTRIL) tablet 5 mg  5 mg Oral Daily Rankin, Shuvon B, NP   5 mg at 05/26/21 1696   LORazepam (ATIVAN) tablet 0.5 mg  0.5 mg Oral TID Ethelene Hal, NP   0.5 mg at 05/26/21 0814   LORazepam (ATIVAN) tablet 1 mg  1 mg Oral Q6H PRN Lindell Spar I, NP   1 mg at 05/24/21 1516   metFORMIN (GLUCOPHAGE) tablet 1,000 mg  1,000 mg Oral BID WC Rankin, Shuvon B, NP   1,000 mg at 05/26/21 0814   OLANZapine (ZYPREXA) injection 10 mg  10 mg Intramuscular BID PRN Briant Cedar, MD       OLANZapine (ZYPREXA) tablet 10 mg  10 mg Oral BID PRN Briant Cedar, MD   10 mg at 05/21/21 1618   propranolol (INDERAL) tablet 10 mg  10 mg Oral BID Massengill, Ovid Curd, MD   10 mg at 05/26/21 0814   QUEtiapine (SEROQUEL) tablet 50 mg  50 mg Oral QHS Ethelene Hal, NP   50 mg at 05/25/21 2038   risperiDONE (RISPERDAL M-TABS) disintegrating tablet 2.5 mg  2.5  mg Oral BID Ethelene Hal, NP   2.5 mg at 05/26/21 7893   sulfamethoxazole-trimethoprim (BACTRIM DS) 800-160 MG per tablet 1 tablet  1 tablet Oral Q12H Lindell Spar I, NP   1 tablet at 05/26/21 0810   traZODone (DESYREL) tablet 50 mg  50 mg Oral QHS PRN Prescilla Sours, PA-C   50 mg at 05/23/21 2003   Vitamin D3 (Vitamin D) tablet 1,000 Units  1,000 Units Oral Daily Ethelene Hal, NP   1,000 Units at 05/26/21 8101   Lab  Results:  Results for orders placed or performed during the hospital encounter of 05/16/21 (from the past 48 hour(s))  Glucose, capillary     Status: Abnormal   Collection Time: 05/24/21  5:17 PM  Result Value Ref Range   Glucose-Capillary 133 (H) 70 - 99 mg/dL    Comment: Glucose reference range applies only to samples taken after fasting for at least 8 hours.  Glucose, capillary     Status: Abnormal   Collection Time: 05/24/21  7:34 PM  Result Value Ref Range   Glucose-Capillary 164 (H) 70 - 99 mg/dL    Comment: Glucose reference range applies only to samples taken after fasting for at least 8 hours.  Glucose, capillary     Status: Abnormal   Collection Time: 05/25/21  5:42 AM  Result Value Ref Range   Glucose-Capillary 127 (H) 70 - 99 mg/dL    Comment: Glucose reference range applies only to samples taken after fasting for at least 8 hours.  Glucose, capillary     Status: Abnormal   Collection Time: 05/25/21 11:49 AM  Result Value Ref Range   Glucose-Capillary 234 (H) 70 - 99 mg/dL    Comment: Glucose reference range applies only to samples taken after fasting for at least 8 hours.  Glucose, capillary     Status: Abnormal   Collection Time: 05/25/21  5:13 PM  Result Value Ref Range   Glucose-Capillary 173 (H) 70 - 99 mg/dL    Comment: Glucose reference range applies only to samples taken after fasting for at least 8 hours.  Glucose, capillary     Status: Abnormal   Collection Time: 05/25/21  7:31 PM  Result Value Ref Range   Glucose-Capillary 180  (H) 70 - 99 mg/dL    Comment: Glucose reference range applies only to samples taken after fasting for at least 8 hours.  Glucose, capillary     Status: Abnormal   Collection Time: 05/26/21  5:44 AM  Result Value Ref Range   Glucose-Capillary 142 (H) 70 - 99 mg/dL    Comment: Glucose reference range applies only to samples taken after fasting for at least 8 hours.   Comment 1 Notify RN   Glucose, capillary     Status: Abnormal   Collection Time: 05/26/21 12:14 PM  Result Value Ref Range   Glucose-Capillary 127 (H) 70 - 99 mg/dL    Comment: Glucose reference range applies only to samples taken after fasting for at least 8 hours.   Blood Alcohol level:  Lab Results  Component Value Date   ETH <10 05/12/2021   ETH <10 00/93/8182   Metabolic Disorder Labs: Lab Results  Component Value Date   HGBA1C 9.2 (H) 05/12/2021   MPG 217.34 05/12/2021   MPG 209 08/19/2020   Lab Results  Component Value Date   PROLACTIN 11.7 05/17/2018   PROLACTIN 105.5 (H) 02/28/2017   Lab Results  Component Value Date   CHOL 101 05/17/2021   TRIG 65 05/17/2021   HDL 32 (L) 05/17/2021   CHOLHDL 3.2 05/17/2021   VLDL 13 05/17/2021   LDLCALC 56 05/17/2021   LDLCALC 49 08/18/2020   Physical Findings: AIMS: Facial and Oral Movements Muscles of Facial Expression: None, normal Lips and Perioral Area: None, normal Jaw: None, normal Tongue: None, normal,Extremity Movements Upper (arms, wrists, hands, fingers): None, normal Lower (legs, knees, ankles, toes): None, normal,  Overall Severity Severity of abnormal movements (highest score from questions above): None, normal Incapacitation due to abnormal movements: None, normal  Patient's awareness of abnormal movements (rate only patient's report): No Awareness,  Dental Status Current problems with teeth and/or dentures?: No Does patient usually wear dentures?: No  CIWA:    COWS:     Musculoskeletal: Strength & Muscle Tone: within normal limits Gait &  Station: normal Patient leans: N/A  Psychiatric Specialty Exam:  Presentation  General Appearance: Appropriate for Environment; Casual; Well Groomed  Eye Contact:Good  Speech:Normal  Speech Volume: Normal  Handedness:Right  Mood and Affect  Mood:Euthymic  Affect: Flat, depressed  Thought Process  Thought Processes:Coherent, tangential  Descriptions of Associations:Intact  Orientation:Full (Time, [name] and Person)  Thought Content:Logical  History of Schizophrenia/Schizoaffective disorder:Yes  Duration of Psychotic Symptoms:Greater than six months  Hallucinations:No patient denies  Ideas of Reference:None  Suicidal Thoughts:No, patient denies  Homicidal Thoughts: No, patient denies  Sensorium  Memory:Immediate Fair; Remote Poor; Recent Fair  Judgment:Other (comment) (Slightly improved)  Insight:Shallow  Executive Functions  Concentration:Fair  Attention Span:Fair  [place] of Knowledge:Poor  Language:Fair  Psychomotor Activity  Psychomotor Activity: Slowed  Assets  Assets:Communication Skills; Desire for Improvement; Financial Resources/Insurance; Housing; Resilience; Social Support  Sleep  Sleep: Good: 7.75 hrs.  Physical Exam: Physical Exam Vitals and nursing note reviewed.  HENT:     Mouth/Throat:     Pharynx: Oropharynx is clear.  Eyes:     Pupils: Pupils are equal, round, and reactive to light.  Cardiovascular:     Pulses: Normal pulses.     Comments: Elevated b/p: 138/91.   Hx. HTN (essential). Patient is currently in no apparent distress. Pulmonary:     Effort: Pulmonary effort is normal.  Genitourinary:    Comments: Deferred Musculoskeletal:        General: Normal range of motion.     Cervical back: Normal range of motion.  Skin:    General: Skin is warm and dry.  Neurological:     Mental Status: She is alert and oriented to person, [name], and time.     Comments: Oriented x 2 (self/situation).   Review of  Systems  Constitutional:  Negative for chills, diaphoresis and fever.  HENT:  Negative for congestion and sore throat.   Eyes:  Negative for blurred vision.  Respiratory:  Negative for cough, shortness of breath and wheezing.   Cardiovascular:  Negative for chest pain and palpitations.  Gastrointestinal:  Negative for abdominal pain, constipation, diarrhea, heartburn, nausea and vomiting.  Genitourinary:  Negative for dysuria.  Musculoskeletal:  Negative for joint pain and myalgias.  Skin: Negative.   Neurological:  Negative for dizziness, tingling, tremors, sensory change, speech change, focal weakness, seizures, loss of consciousness, weakness and headaches.  Psychiatric/Behavioral:  Negative for hallucinations (denies hallucinations), memory loss and suicidal ideas. Substance abuse: Hx. THC use disorder.The patient is not nervous/anxious and does not have insomnia.    Blood pressure (!) 127/56, pulse 93, temperature 97.7 F (36.5 C), temperature source Oral, resp. rate 18, height 5\' 11"  (1.803 m), weight 114.8 kg, SpO2 100 %. Body mass index is 35.29 kg/m.  Treatment Plan Summary: Daily contact with patient to assess and evaluate symptoms and progress in treatment and Medication management.   Continue inpatient hospitalization.  Will continue today 05/26/2021 plan as below except where it is noted.   Diagnoses:  Schizoaffective disorder, bipolar-type.  Cannabis use disorder.  Other medical issues:  Diabetes Mellitus.  HTN (essential).  Plan.  Schizoaffective disorder:  Continue Risperdal 2.5 mg PO bid.  Continue Seroquel 50 mg po Q bedtime. Continue Depakote  500 mg po bid.  EPS prevention. Continue Cogentin to 0.5 mg po bid.  Anxiety.  Continue Ativan 0.5 mg PO TID, schedule 8A, 2PM, /8PM Continue Vistaril 25 mg po tid prn. Continue Propranolol 10 mg po bid for anxiety/elevated heart rate.  Insomnia.  Continue Trazodone 50 mg po Q hs prn.  UTI:  Continue Bactrim Ds  800-160 mg po bid x 7 days. First dose on 11/18 at 1216.   Vitamin D deficiency:  Continue Vitamin D 1,000 units daily  Agitation/psychosis.  Continue Olanzapine 10 mg po or IM bid prn. Continue lorazepam 1 mg po Q 6 hrs prn for anxiety.  Other medical issues, continue:  Lipitor 80 mg po daily for high cholesterol.  Insulin glargine-yfgn 16 units subQ daily for DM. Metformin 1,000 mg po bid for diabetes mellitus. Lisinopril 5 mg po daily for HTN. Continue Ferrous sulfate 325 mg po bid for anemia. Continue the sliding scale insulin as recommended per CBG results.  Labs reviewed: CMP with albumin 3.2, AST 14, Total protein 6.4. Vitamin D 17.60. Vitamin B12 759. CBC with H/H 10.4/33.5, MCH 77.5, MCH 24.1, RDW 16.7. Glucose 190. TSH 0.408, Free T4 1.11. Valproic acid level 64.   Labs pending: Vitamin B1.   Encourage group attendance/participation. Discharge disposition plan in progress.  Lindell Spar, NP 05/26/2021, 2:41 PM  Patient ID: [name], female   DOB: 05-28-1976, 45 y.o.   MRN: [id number]

## 2021-05-26 NOTE — [facility] Group Notes (Signed)
[facility] Group Notes:  (Nursing/MHT/Case Management/Adjunct)  Date:  05/26/2021  Time:  4:38 PM  Type of Therapy:  Psychoeducational Skills  Participation Level:  Active  Participation Quality:  Appropriate and Attentive  Affect:  Appropriate  Cognitive:  Alert and Appropriate  Insight:  Appropriate and Good  Engagement in Group:  Engaged and Improving  Modes of Intervention:  Activity, Discussion, and Support  Summary of Progress/Problems: Group consisted of reading and discussing a poem called "There's a Hole in My Sidewalk" which talk about learning from your mistakes and how someone will deal with the same issues until they change their behavior and want to change for the better in order to stop those mistakes from happening.   Dalana Pfahler J Atreyu Mak 05/26/2021, 4:38 PM

## 2021-05-26 NOTE — Progress Notes (Signed)
Pt a lot better on the unit    05/26/21 2200  Psych Admission Type (Psych Patients Only)  Admission Status Involuntary  Psychosocial Assessment  Patient Complaints Anxiety  Eye Contact Fair  Facial Expression Wide-eyed  Affect Anxious  Speech Slow  Interaction Forwards little;Minimal  Motor Activity Slow (very slow in ambulation)  Appearance/Hygiene Unremarkable  Behavior Characteristics Cooperative  Mood Pleasant  Aggressive Behavior  Effect No apparent injury  Thought Process  Coherency WDL  Content Preoccupation  Delusions Paranoid  Perception Derealization  Hallucination None reported or observed  Judgment Poor  Confusion Mild  Danger to Self  Current suicidal ideation? Denies  Danger to Others  Danger to Others None reported or observed

## 2021-05-26 NOTE — Progress Notes (Addendum)
   05/26/21 0900  Psych Admission Type (Psych Patients Only)  Admission Status Involuntary  Psychosocial Assessment  Patient Complaints Anxiety  Eye Contact Fair  Facial Expression Wide-eyed  Affect Anxious  Speech Slow  Interaction Forwards little;Minimal  Motor Activity Slow (very slow in ambulation)  Appearance/Hygiene Unremarkable  Behavior Characteristics Cooperative  Mood Pleasant;Anxious  Aggressive Behavior  Effect No apparent injury  Thought Process  Coherency WDL  Content Preoccupation  Delusions Paranoid  Perception Derealization  Hallucination None reported or observed  Judgment Poor  Confusion Mild  Danger to Self  Current suicidal ideation? Denies  Danger to Others  Danger to Others None reported or observed   D. Pt presents with a much brighter affect, improved mood- observed in the milieu throughout the shift, seen interacting appropriately with peers and staff. Per pt's self inventory, pt rated her depression, hopelessness and anxiety all 0's today. Pt reported that her goal was "to eat right, take meds, sleep and coordinate discharge plans". . Pt currently denies SI/HI and AVH and does not appear to be responding to internal stimuli. A. Labs and vitals monitored. Pt given and educated on medications. Pt supported emotionally and encouraged to express concerns and ask questions.   R. Pt remains safe with 15 minute checks. Will continue POC.

## 2021-05-27 ENCOUNTER — Encounter (HOSPITAL_COMMUNITY): Payer: Self-pay

## 2021-05-27 LAB — GLUCOSE, CAPILLARY
Glucose-Capillary: 131 mg/dL — ABNORMAL HIGH (ref 70–99)
Glucose-Capillary: 126 mg/dL — ABNORMAL HIGH (ref 70–99)
Glucose-Capillary: 167 mg/dL — ABNORMAL HIGH (ref 70–99)
Glucose-Capillary: 161 mg/dL — ABNORMAL HIGH (ref 70–99)

## 2021-05-27 LAB — VITAMIN B1: Vitamin B1 (Thiamine): 95.1 nmol/L (ref 66.5–200.0)

## 2021-05-27 NOTE — Progress Notes (Signed)
Pt visible on the unit this evening, pt appeared less paranoid    05/27/21 2100  Psych Admission Type (Psych Patients Only)  Admission Status Involuntary  Psychosocial Assessment  Patient Complaints Anxiety  Eye Contact Fair  Facial Expression Anxious  Affect Anxious  Speech Slow  Interaction Cautious;Forwards little;Guarded;Minimal  Motor Activity Slow  Appearance/Hygiene Unremarkable  Behavior Characteristics Cooperative;Anxious  Mood Anxious;Pleasant  Thought Process  Coherency WDL  Content WDL  Delusions None reported or observed  Perception WDL  Hallucination None reported or observed  Judgment WDL  Confusion WDL  Danger to Self  Current suicidal ideation? Denies  Danger to Others  Danger to Others None reported or observed

## 2021-05-27 NOTE — Progress Notes (Signed)
Progress note    05/27/21 0813  Psych Admission Type (Psych Patients Only)  Admission Status Involuntary  Psychosocial Assessment  Patient Complaints Anxiety  Eye Contact Fair  Facial Expression Anxious  Affect Anxious  Speech Slow  Interaction Cautious;Forwards little;Guarded;Minimal  Motor Activity Slow  Appearance/Hygiene Unremarkable  Behavior Characteristics Cooperative;Anxious;Guarded  Mood Anxious;Preoccupied;Pleasant  Thought Process  Coherency WDL  Content WDL  Delusions None reported or observed  Perception WDL  Hallucination None reported or observed  Judgment WDL  Confusion WDL  Danger to Self  Current suicidal ideation? Denies  Danger to Others  Danger to Others None reported or observed

## 2021-05-27 NOTE — Progress Notes (Signed)
[name]  05/27/2021 2:24 PM [name]  MRN:  [id number]  Subjective: [name] reports "My mood is good. [name] got up feeling a little sluggish, but it went away as the morning progressed. [name] had a good breakfast. It has been been a good morning so far".  Evaluation on the unit today 05/27/21:  Patient is seen in her room, chart reviewed, case discussed with the treatment team. [name] presents with a much improved/reactive affect, good eye contact. She is verbally responsive. She is well groomed. She reports doing well. Denies any symptoms of depression or anxiety. She denies AVH, delusional thoughts or paranoia. She does not appear to to be responding to any internal stimuli. The only complain reported this morning is feeling sluggish when woken up this morning. However, she says this got better as the morning progressed. States had a good breakfast. Reviewed vital signs; [name]/P wnl 118/79 , Pulse slightly elevated 119. She reports sleeping well last night, record reflects she slept 6.5 hours. Taking & tolerating treatment regimen without any side effects reported. We will continue to monitor for safety & titrate medications as needed. Patient is participating in the group sessions. We will continue current plan of care as already in progress.  Reason for admission: 45 year old female with a reported psychiatric history of "schizoaffective, bipolar, and personality" who was admitted to this psychiatric unit for disorientation, confusion, hallucinations, suicidal thoughts, and delusions. During the her admission interview, about details of her HPI.  She reports that she was driving from [name], to [name], realized that she was not thinking clearly and confused, and went to the [name] at [name] for evaluation.    Principal Problem: Schizoaffective disorder, bipolar type ([name])  Diagnosis: Principal Problem:   Schizoaffective disorder, bipolar type ([name]) Active Problems:   Cannabis use  disorder, moderate, dependence ([name])  Total Time spent with patient: 15 minutes  Past Psychiatric History: Schizoaffective disorder, bipolar-type.  Past Medical History:  Past Medical History:  Diagnosis Date   Abnormal Pap smear    Bipolar 1 disorder ([place])    Diabetes mellitus without complication (HCC)    Fibroids    Hypertension    IBS (irritable bowel syndrome)     Past Surgical History:  Procedure Laterality Date   CERVICAL BIOPSY     CHOLECYSTECTOMY     ESSURE TUBAL LIGATION     HIATAL HERNIA REPAIR     TUBAL LIGATION     Family History:  Family History  Problem Relation Age of Onset   Diabetes Father    Diabetes Paternal Grandmother    Diabetes Maternal Grandmother    Mental illness Cousin    Healthy Mother    Family Psychiatric  History: See H&P.  Social History:  Social History   Substance and Sexual Activity  Alcohol Use Not Currently   Comment: occasional     Social History   Substance and Sexual Activity  Drug Use Not Currently    Social History   Socioeconomic History   Marital status: Single    Spouse name: Not on file   Number of children: 2   Years of education: Not on file   Highest education level: Master's degree (e.g., MA, MS, MEng, MEd, MSW, MBA)  Occupational History   Not on file  Tobacco Use   Smoking status: Former    Types: Cigarettes   Smokeless tobacco: Never  Vaping Use   Vaping Use: Some days   Substances: Nicotine, Flavoring  Substance and Sexual  Activity   Alcohol use: Not Currently    Comment: occasional   Drug use: Not Currently   Sexual activity: Yes    Birth control/protection: Surgical  Other Topics Concern   Not on file  Social History Narrative   ** Merged History Encounter **       Social Determinants of Health   Financial Resource Strain: Not on file  Food Insecurity: Not on file  Transportation Needs: Not on file  Physical Activity: Not on file  Stress: Not on file  Social Connections: Not on  file   Additional Social History:   Sleep:  6.5  Appetite:  Good  Current Medications: Current Facility-Administered Medications  Medication Dose Route Frequency Provider Last Rate Last Admin   acetaminophen (TYLENOL) tablet 650 mg  650 mg Oral Q4H PRN [name] [name], [name]   650 mg at 05/20/21 1732   atorvastatin (LIPITOR) tablet 80 mg  80 mg Oral Daily [name], [name], [name]   80 mg at 05/27/21 0813   divalproex (DEPAKOTE) DR tablet 500 mg  500 mg Oral BID [name] [name], [name]   500 mg at 05/27/21 0813   docusate sodium (COLACE) capsule 100 mg  100 mg Oral Daily [name] [name], [name]   100 mg at 05/27/21 0815   ferrous sulfate tablet 325 mg  325 mg Oral BID WC [name], Alyssa Pun [name], [name]   325 mg at 05/27/21 0813   hydrOXYzine (ATARAX/VISTARIL) tablet 25 mg  25 mg Oral TID PRN Alyssa Sours, [name]   25 mg at 05/23/21 2235   insulin aspart (novoLOG) injection 0-5 Units  0-5 Units Subcutaneous QHS Alyssa Sours, [name]   3 Units at 05/16/21 2254   insulin aspart (novoLOG) injection 0-9 Units  0-9 Units Subcutaneous TID WC [name], [name], [name]   1 Units at 05/27/21 1205   insulin glargine-yfgn (SEMGLEE) injection 16 Units  16 Units Subcutaneous Daily [name], Alyssa Curd, [name]   16 Units at 05/27/21 0813   lisinopril (ZESTRIL) tablet 5 mg  5 mg Oral Daily [name], [name], [name]   5 mg at 05/27/21 0813   LORazepam (ATIVAN) tablet 0.5 mg  0.5 mg Oral TID Alyssa Hal, [name]   0.5 mg at 05/27/21 0815   LORazepam (ATIVAN) tablet 1 mg  1 mg Oral Q6H PRN [name] [name], [name]   1 mg at 05/24/21 1516   metFORMIN (GLUCOPHAGE) tablet 1,000 mg  1,000 mg Oral BID WC [name], [name], [name]   1,000 mg at 05/27/21 0813   OLANZapine (ZYPREXA) injection 10 mg  10 mg Intramuscular BID PRN Alyssa Cedar, [name]       OLANZapine (ZYPREXA) tablet 10 mg  10 mg Oral BID PRN Alyssa Cedar, [name]   10 mg at 05/21/21 1618   propranolol (INDERAL) tablet 10 mg  10 mg Oral BID [name], Alyssa Curd, [name]   10 mg at 05/27/21 0813    QUEtiapine (SEROQUEL) tablet 50 mg  50 mg Oral QHS Alyssa Hal, [name]   50 mg at 05/26/21 2059   risperiDONE (RISPERDAL M-TABS) disintegrating tablet 3 mg  3 mg Oral BID [name], Alyssa Curd, [name]   3 mg at 05/27/21 0813   traZODone (DESYREL) tablet 50 mg  50 mg Oral QHS PRN Alyssa Sours, [name]   50 mg at 05/23/21 2003   Vitamin D3 (Vitamin D) tablet 1,000 Units  1,000 Units Oral Daily Alyssa Hal, [name]   1,000 Units at 05/27/21 [phone]  Lab Results:  Results for orders placed or performed during the hospital encounter of 05/16/21 (from the past 48 hour(s))  Glucose, capillary     Status: Abnormal   Collection Time: 05/25/21  5:13 PM  Result Value Ref Range   Glucose-Capillary 173 (H) 70 - 99 mg/dL    Comment: Glucose reference range applies only to samples taken after fasting for at least 8 hours.  Glucose, capillary     Status: Abnormal   Collection Time: 05/25/21  7:31 PM  Result Value Ref Range   Glucose-Capillary 180 (H) 70 - 99 mg/dL    Comment: Glucose reference range applies only to samples taken after fasting for at least 8 hours.  Glucose, capillary     Status: Abnormal   Collection Time: 05/26/21  5:44 AM  Result Value Ref Range   Glucose-Capillary 142 (H) 70 - 99 mg/dL    Comment: Glucose reference range applies only to samples taken after fasting for at least 8 hours.   Comment 1 Notify RN   Glucose, capillary     Status: Abnormal   Collection Time: 05/26/21 12:14 PM  Result Value Ref Range   Glucose-Capillary 127 (H) 70 - 99 mg/dL    Comment: Glucose reference range applies only to samples taken after fasting for at least 8 hours.  Glucose, capillary     Status: Abnormal   Collection Time: 05/26/21  5:02 PM  Result Value Ref Range   Glucose-Capillary 234 (H) 70 - 99 mg/dL    Comment: Glucose reference range applies only to samples taken after fasting for at least 8 hours.  Glucose, capillary     Status: Abnormal   Collection Time: 05/26/21  8:30 PM  Result  Value Ref Range   Glucose-Capillary 167 (H) 70 - 99 mg/dL    Comment: Glucose reference range applies only to samples taken after fasting for at least 8 hours.  Glucose, capillary     Status: Abnormal   Collection Time: 05/27/21  5:47 AM  Result Value Ref Range   Glucose-Capillary 131 (H) 70 - 99 mg/dL    Comment: Glucose reference range applies only to samples taken after fasting for at least 8 hours.  Glucose, capillary     Status: Abnormal   Collection Time: 05/27/21 11:57 AM  Result Value Ref Range   Glucose-Capillary 126 (H) 70 - 99 mg/dL    Comment: Glucose reference range applies only to samples taken after fasting for at least 8 hours.   Blood Alcohol level:  Lab Results  Component Value Date   ETH <10 05/12/2021   ETH <10 87/68/1157   Metabolic Disorder Labs: Lab Results  Component Value Date   HGBA1C 9.2 (H) 05/12/2021   MPG 217.34 05/12/2021   MPG 209 08/19/2020   Lab Results  Component Value Date   PROLACTIN 11.7 05/17/2018   PROLACTIN 105.5 (H) 02/28/2017   Lab Results  Component Value Date   CHOL 101 05/17/2021   TRIG 65 05/17/2021   HDL 32 (L) 05/17/2021   CHOLHDL 3.2 05/17/2021   VLDL 13 05/17/2021   LDLCALC 56 05/17/2021   LDLCALC 49 08/18/2020   Physical Findings: AIMS: Facial and Oral Movements Muscles of Facial Expression: None, normal Lips and Perioral Area: None, normal Jaw: None, normal Tongue: None, normal,Extremity Movements Upper (arms, wrists, hands, fingers): None, normal Lower (legs, knees, ankles, toes): None, normal,  Overall Severity Severity of abnormal movements (highest score from questions above): None, normal Incapacitation due to abnormal movements: None,  normal Patient's awareness of abnormal movements (rate only patient's report): No Awareness,  Dental Status Current problems with teeth and/or dentures?: No Does patient usually wear dentures?: No  CIWA:    COWS:     Musculoskeletal: Strength & Muscle Tone: within  normal limits Gait & Station: normal Patient leans: N/A  Psychiatric Specialty Exam:  Presentation  General Appearance: Appropriate for Environment; Casual; Well Groomed  Eye Contact:Good  Speech:Normal  Speech Volume: Normal  Handedness:Right  Mood and Affect  Mood:Euthymic  Affect: Improving.  Thought Process  Thought Processes:Coherent,   Descriptions of Associations:Intact  Orientation:Full (Time, Place and Person)  Thought Content:Logical  History of Schizophrenia/Schizoaffective disorder:Yes  Duration of Psychotic Symptoms:Greater than six months  Hallucinations:No patient denies  Ideas of Reference:None  Suicidal Thoughts:No, patient denies  Homicidal Thoughts: No, patient denies  Sensorium  Memory:Immediate Good; Recent Good; Remote Fair  Judgment:Fair (Improving remarkably)  Insight:Fair  Executive Functions  Concentration:Fair  Attention Span:Fair  [place]  Language:Good  Psychomotor Activity  Psychomotor Activity: Slowed  Assets  Assets:Communication Skills; Desire for Improvement; Financial Resources/Insurance; Housing; Resilience; Social Support  Sleep  Sleep: Good: 6.5 hrs.  Physical Exam: Physical Exam Vitals and nursing [name] reviewed.  HENT:     Mouth/Throat:     Pharynx: Oropharynx is clear.  Eyes:     Pupils: Pupils are equal, round, and reactive to light.  Cardiovascular:     Pulses: Normal pulses.     Comments: Elevated [name]/p: 138/91.   Hx. HTN (essential). Patient is currently in no apparent distress. Pulmonary:     Effort: Pulmonary effort is normal.  Genitourinary:    Comments: Deferred Musculoskeletal:        General: Normal range of motion.     Cervical back: Normal range of motion.  Skin:    General: Skin is warm and dry.  Neurological:     Mental Status: She is alert and oriented to person, place, and time.     Comments: Oriented x 2 (self/situation).   Review of  Systems  Constitutional:  Negative for chills, diaphoresis and fever.  HENT:  Negative for congestion and sore throat.   Eyes:  Negative for blurred vision.  Respiratory:  Negative for cough, shortness of breath and wheezing.   Cardiovascular:  Negative for chest pain and palpitations.  Gastrointestinal:  Negative for abdominal pain, constipation, diarrhea, heartburn, nausea and vomiting.  Genitourinary:  Negative for dysuria.  Musculoskeletal:  Negative for joint pain and myalgias.  Skin: Negative.   Neurological:  Negative for dizziness, tingling, tremors, sensory change, speech change, focal weakness, seizures, loss of consciousness, weakness and headaches.  Psychiatric/Behavioral:  Negative for hallucinations (denies hallucinations), memory loss and suicidal ideas. Substance abuse: Hx. THC use disorder.The patient is not nervous/anxious and does not have insomnia.    Blood pressure 118/79, pulse (!) 119, temperature 98.1 F (36.7 C), temperature source Oral, resp. rate 16, height 5\' 11"  (1.803 m), weight 114.8 kg, SpO2 99 %. Body mass index is 35.29 kg/m.  Treatment Plan Summary: Daily contact with patient to assess and evaluate symptoms and progress in treatment and Medication management.   Continue inpatient hospitalization.  Will continue today 05/27/2021 plan as below except where it is noted.   Diagnoses:  Schizoaffective disorder, bipolar-type.  Cannabis use disorder.  Other medical issues:  Diabetes Mellitus.  HTN (essential).  Plan.  Schizoaffective disorder:  Continue Risperdal-ODT 3 mg po bid.  Continue Seroquel 50 mg po Q bedtime. Continue Depakote 500  mg po bid.  EPS prevention. Continue Cogentin to 0.5 mg po bid.  Anxiety.  Continue Ativan 0.5 mg PO TID, schedule 8A, 2PM, /8PM Continue Vistaril 25 mg po tid prn. Continue Propranolol 10 mg po bid for anxiety/elevated heart rate.  Insomnia.  Continue Trazodone 50 mg po Q hs prn.  UTI:  Continue Bactrim  Ds 800-160 mg po bid x 7 days :(completed).  Vitamin D deficiency:  Continue Vitamin D 1,000 units daily  Agitation/psychosis.  Continue Olanzapine 10 mg po or IM bid prn. Continue lorazepam 1 mg po Q 6 hrs prn for anxiety.  Other medical issues, continue:  Lipitor 80 mg po daily for high cholesterol.  Insulin glargine-yfgn 16 units subQ daily for DM. Metformin 1,000 mg po bid for diabetes mellitus. Lisinopril 5 mg po daily for HTN. Continue Ferrous sulfate 325 mg po bid for anemia. Continue the sliding scale insulin as recommended per CBG results.  Labs reviewed: CMP with albumin 3.2, AST 14, Total protein 6.4. Vitamin D 17.60. Vitamin B12 759. CBC with H/H 10.4/33.5, MCH 77.5, MCH 24.1, RDW 16.7. Glucose 190. TSH 0.408, Free T4 1.11. Valproic acid level 64.   Labs result Vitamin B1: 95.1  Encourage group attendance/participation. Discharge disposition plan in progress.  Alyssa Spar, [name], pmhnp, fnp-bc 05/27/2021, 2:24 PM  Patient ID: [name], female   DOB: 03-21-1976, 45 y.o.   MRN: [id number] Patient ID: [name], female   DOB: 1975/09/08, 45 y.o.   MRN: [id number]

## 2021-05-27 NOTE — [facility] Group Notes (Signed)
[facility] Group Notes:  (Nursing/MHT/Case Management/Adjunct)  Date:  05/27/2021  Time:  2:46 PM  Type of Therapy:   Therapeutic Relaxation group  Participation Level:  Minimal  Participation Quality:  Appropriate  Affect:  Appropriate  Cognitive:  Appropriate  Insight:  Appropriate  Engagement in Group:  Engaged  Modes of Intervention:  Activity, Discussion, and Support  Summary of Progress/Problems:  We dicussed relaxation which can be achieved through meditation, autogenics, and progressive muscle relaxation. Relaxation helps improve coping with stress. Stress is the leading cause of mental problems and physical problems, therefore feeling relaxed is beneficial for a person's health. Action that took place in group was positive visualization technique along with meditative music.   [name] [name] 05/27/2021, 2:46 PM

## 2021-05-27 NOTE — BH IP Treatment Plan (Signed)
Interdisciplinary Treatment and Diagnostic Plan Update  05/27/2021 Time of Session: 0900 [name] MRN: [id number]  Principal Diagnosis: Schizoaffective disorder, bipolar type [facility])  Secondary Diagnoses: Principal Problem:   Schizoaffective disorder, bipolar type ([place]) Active Problems:   Cannabis use disorder, moderate, dependence ([place])   Current Medications:  Current Facility-Administered Medications  Medication Dose Route Frequency Provider Last Rate Last Admin   acetaminophen (TYLENOL) tablet 650 mg  650 mg Oral Q4H PRN Lindell Spar I, NP   650 mg at 05/20/21 1732   atorvastatin (LIPITOR) tablet 80 mg  80 mg Oral Daily Rankin, Shuvon B, NP   80 mg at 05/27/21 0813   divalproex (DEPAKOTE) DR tablet 500 mg  500 mg Oral BID Lindell Spar I, NP   500 mg at 05/27/21 0813   docusate sodium (COLACE) capsule 100 mg  100 mg Oral Daily Lindell Spar I, NP   100 mg at 05/27/21 0815   ferrous sulfate tablet 325 mg  325 mg Oral BID WC Nwoko, Herbert Pun I, NP   325 mg at 05/27/21 0813   hydrOXYzine (ATARAX/VISTARIL) tablet 25 mg  25 mg Oral TID PRN Prescilla Sours, PA-C   25 mg at 05/23/21 2235   insulin aspart (novoLOG) injection 0-5 Units  0-5 Units Subcutaneous QHS Prescilla Sours, PA-C   3 Units at 05/16/21 2254   insulin aspart (novoLOG) injection 0-9 Units  0-9 Units Subcutaneous TID WC Rankin, Shuvon B, NP   1 Units at 05/27/21 0628   insulin glargine-yfgn (SEMGLEE) injection 16 Units  16 Units Subcutaneous Daily Massengill, Ovid Curd, MD   16 Units at 05/27/21 0813   lisinopril (ZESTRIL) tablet 5 mg  5 mg Oral Daily Rankin, Shuvon B, NP   5 mg at 05/27/21 0813   LORazepam (ATIVAN) tablet 0.5 mg  0.5 mg Oral TID Ethelene Hal, NP   0.5 mg at 05/27/21 0815   LORazepam (ATIVAN) tablet 1 mg  1 mg Oral Q6H PRN Lindell Spar I, NP   1 mg at 05/24/21 1516   metFORMIN (GLUCOPHAGE) tablet 1,000 mg  1,000 mg Oral BID WC Rankin, Shuvon B, NP   1,000 mg at 05/27/21 0813   OLANZapine (ZYPREXA) injection 10  mg  10 mg Intramuscular BID PRN Briant Cedar, MD       OLANZapine (ZYPREXA) tablet 10 mg  10 mg Oral BID PRN Briant Cedar, MD   10 mg at 05/21/21 1618   propranolol (INDERAL) tablet 10 mg  10 mg Oral BID Massengill, Ovid Curd, MD   10 mg at 05/27/21 0813   QUEtiapine (SEROQUEL) tablet 50 mg  50 mg Oral QHS Ethelene Hal, NP   50 mg at 05/26/21 2059   risperiDONE (RISPERDAL M-TABS) disintegrating tablet 3 mg  3 mg Oral BID Massengill, Ovid Curd, MD   3 mg at 05/27/21 0813   traZODone (DESYREL) tablet 50 mg  50 mg Oral QHS PRN Prescilla Sours, PA-C   50 mg at 05/23/21 2003   Vitamin D3 (Vitamin D) tablet 1,000 Units  1,000 Units Oral Daily Ethelene Hal, NP   1,000 Units at 05/27/21 0813   PTA Medications: Medications Prior to Admission  Medication Sig Dispense Refill Last Dose   Accu-Chek Softclix Lancets lancets Use to check FSBS BID. Dx: E11.42 100 each 2    ARIPiprazole (ABILIFY) 10 MG tablet Take 1 tablet (10 mg total) by mouth daily. 30 tablet 0    atorvastatin (LIPITOR) 80 MG tablet Take 1 tablet (80  mg total) by mouth daily. 90 tablet 0    Blood Glucose Monitoring Suppl (ACCU-CHEK GUIDE ME) w/Device KIT Use to check FSBS BID. Dx: E11.42 1 kit 0    Blood Glucose Monitoring Suppl (TRUE METRIX METER) w/Device KIT Use as directed 1 kit 0    dapagliflozin propanediol (FARXIGA) 10 MG TABS tablet Take 1 tablet (10 mg total) by mouth daily before breakfast. 90 tablet 0    divalproex (DEPAKOTE) 250 MG DR tablet Take 1 tablet (250 mg total) by mouth 2 (two) times daily. 60 tablet 0    glucose blood (TRUE METRIX BLOOD GLUCOSE TEST) test strip Use as instructed 100 each 12    Insulin Pen Needle (TRUEPLUS PEN NEEDLES) 32G X 4 MM MISC Use as instructed to inject Victoza daily. 100 each 0    liraglutide (VICTOZA) 18 MG/3ML SOPN INJECT 1.8 MG UNDER THE SKIN EVERY DAY (Patient taking differently: Inject 1.8 mg into the skin daily.) 9 mL 0    lisinopril (ZESTRIL) 5 MG tablet Take  1 tablet (5 mg total) by mouth daily. 90 tablet 0    LORazepam (ATIVAN) 1 MG tablet Take 1 tablet (1 mg total) by mouth 3 (three) times daily. 90 tablet 0    metFORMIN (GLUCOPHAGE) 1000 MG tablet Take 1 tablet (1,000 mg total) by mouth 2 (two) times daily with a meal. 180 tablet 0    neomycin-polymyxin-hydrocortisone (CORTISPORIN) 3.5-10000-1 OTIC suspension Place 4 drops into the right ear 3 (three) times daily. (Patient not taking: No sig reported) 10 mL 0    nitrofurantoin, macrocrystal-monohydrate, (MACROBID) 100 MG capsule Take 1 capsule (100 mg total) by mouth 2 (two) times daily. (Patient taking differently: Take 100 mg by mouth See admin instructions. Bid x 5 days) 10 capsule 0    TRUEplus Lancets 28G MISC Use as directed 100 each 1     Patient Stressors: Other: unable to verbalize    Patient Strengths: Capable of independent living  Communication skills  Motivation for treatment/growth  Supportive family/friends  Work skills   Treatment Modalities: Medication Management, Group therapy, Case management,  1 to 1 session with clinician, Psychoeducation, Recreational therapy.   Physician Treatment Plan for Primary Diagnosis: Schizoaffective disorder, bipolar type ([place]) Long Term Goal(s): Improvement in symptoms so as ready for discharge   Short Term Goals: Ability to identify changes in lifestyle to reduce recurrence of condition will improve Ability to verbalize feelings will improve Ability to disclose and discuss suicidal ideas Ability to demonstrate self-control will improve Ability to identify and develop effective coping behaviors will improve Ability to maintain clinical measurements within normal limits will improve Compliance with prescribed medications will improve Ability to identify triggers associated with substance abuse/mental health issues will improve  Medication Management: Evaluate patient's response, side effects, and tolerance of medication  regimen.  Therapeutic Interventions: 1 to 1 sessions, Unit Group sessions and Medication administration.  Evaluation of Outcomes: Not Met  Physician Treatment Plan for Secondary Diagnosis: Principal Problem:   Schizoaffective disorder, bipolar type ([place]) Active Problems:   Cannabis use disorder, moderate, dependence ([place])  Long Term Goal(s): Improvement in symptoms so as ready for discharge   Short Term Goals: Ability to identify changes in lifestyle to reduce recurrence of condition will improve Ability to verbalize feelings will improve Ability to disclose and discuss suicidal ideas Ability to demonstrate self-control will improve Ability to identify and develop effective coping behaviors will improve Ability to maintain clinical measurements within normal limits will improve Compliance with prescribed medications  will improve Ability to identify triggers associated with substance abuse/mental health issues will improve     Medication Management: Evaluate patient's response, side effects, and tolerance of medication regimen.  Therapeutic Interventions: 1 to 1 sessions, Unit Group sessions and Medication administration.  Evaluation of Outcomes: Not Met   RN Treatment Plan for Primary Diagnosis: Schizoaffective disorder, bipolar type ([place]) Long Term Goal(s): Knowledge of disease and therapeutic regimen to maintain health will improve  Short Term Goals: Ability to remain free from injury will improve, Ability to verbalize frustration and anger appropriately will improve, Ability to demonstrate self-control, Ability to participate in decision making will improve, Ability to verbalize feelings will improve, Ability to disclose and discuss suicidal ideas, Ability to identify and develop effective coping behaviors will improve, and Compliance with prescribed medications will improve  Medication Management: RN will administer medications as ordered by provider, will assess and evaluate  patient's response and provide education to patient for prescribed medication. RN will report any adverse and/or side effects to prescribing provider.  Therapeutic Interventions: 1 on 1 counseling sessions, Psychoeducation, Medication administration, Evaluate responses to treatment, Monitor vital signs and CBGs as ordered, Perform/monitor CIWA, COWS, AIMS and Fall Risk screenings as ordered, Perform wound care treatments as ordered.  Evaluation of Outcomes: Not Met   LCSW Treatment Plan for Primary Diagnosis: Schizoaffective disorder, bipolar type ([place]) Long Term Goal(s): Safe transition to appropriate next level of care at discharge, Engage patient in therapeutic group addressing interpersonal concerns.  Short Term Goals: Engage patient in aftercare planning with referrals and resources, Increase social support, Increase ability to appropriately verbalize feelings, Increase emotional regulation, Facilitate acceptance of mental health diagnosis and concerns, Facilitate patient progression through stages of change regarding substance use diagnoses and concerns, Identify triggers associated with mental health/substance abuse issues, and Increase skills for wellness and recovery  Therapeutic Interventions: Assess for all discharge needs, 1 to 1 time with Social worker, Explore available resources and support systems, Assess for adequacy in community support network, Educate family and significant other(s) on suicide prevention, Complete Psychosocial Assessment, Interpersonal group therapy.  Evaluation of Outcomes: Not Met   Progress in Treatment: Attending groups: Yes. Participating in groups: No. Taking medication as prescribed: Yes. Toleration medication: Yes. Family/Significant other contact made: Yes, individual(s) contacted:  SPE completed with Danne Baxter, Mother at [phone] Patient understands diagnosis: Yes. Discussing patient identified problems/goals with staff:  Yes. Medical problems stabilized or resolved: Yes. Denies suicidal/homicidal ideation: No. Issues/concerns per patient self-inventory: Yes. Other: none   New problem(s) identified: No, Describe:  No additional problems identified  New Short Term/Long Term Goal(s): No additional goals identified at this time.   Patient Goals:  No additional goals identified at this time.   Discharge Plan or Barriers: Patient is homeless, CSW to provide options for housing.   Reason for Continuation of Hospitalization: Other; describe Mood dysregulation, disorganization, suicidal ideation, hallucinations.   Estimated Length of Stay: TBD    Scribe for Treatment Team: Larose Kells 05/27/2021 10:29 AM

## 2021-05-27 NOTE — Plan of Care (Signed)
  Problem: Education: Goal: Knowledge of [facility] Education information/materials will improve Outcome: Progressing Goal: Emotional status will improve Outcome: Progressing Goal: Mental status will improve Outcome: Progressing Goal: Verbalization of understanding the information provided will improve Outcome: Progressing   

## 2021-05-27 NOTE — Group Note (Signed)
[facility] LCSW Group Therapy Note   Group Date: 05/27/2021 Start Time: 1300 End Time: 1400   Type of Therapy/Topic:  Group Therapy:  Emotion Regulation  Participation Level:  Active   Mood: euthymic   Description of Group:    The purpose of this group is to assist patients in learning to regulate negative emotions and experience positive emotions. Patients will be guided to discuss ways in which they have been vulnerable to their negative emotions. These vulnerabilities will be juxtaposed with experiences of positive emotions or situations, and patients challenged to use positive emotions to combat negative ones. Special emphasis will be placed on coping with negative emotions in conflict situations, and patients will process healthy conflict resolution skills.  Therapeutic Goals: Patient will identify two positive emotions or experiences to reflect on in order to balance out negative emotions:  Patient will label two or more emotions that they find the most difficult to experience:  Patient will be able to demonstrate positive conflict resolution skills through discussion or role plays:   Summary of Patient Progress: Patient was present for the entirety of the group session. Patient was an active listener and participated in the topic of discussion, provided helpful advice to others, and added nuance to topic of conversation. Patient shared that she has manic episodes where she needs little sleep and feeds extra motivated. She states that she relies on others to let her know when she appears manic.     Therapeutic Modalities:   Cognitive Behavioral Therapy Feelings Identification Dialectical Behavioral Therapy   Durenda Hurt, [place]

## 2021-05-27 NOTE — Progress Notes (Signed)
Did not attend group 

## 2021-05-28 DIAGNOSIS — F250 Schizoaffective disorder, bipolar type: Secondary | ICD-10-CM | POA: Diagnosis not present

## 2021-05-28 LAB — GLUCOSE, CAPILLARY
Glucose-Capillary: 179 mg/dL — ABNORMAL HIGH (ref 70–99)
Glucose-Capillary: 146 mg/dL — ABNORMAL HIGH (ref 70–99)
Glucose-Capillary: 159 mg/dL — ABNORMAL HIGH (ref 70–99)
Glucose-Capillary: 181 mg/dL — ABNORMAL HIGH (ref 70–99)

## 2021-05-28 MED ORDER — ALUM & MAG HYDROXIDE-SIMETH 200-200-20 MG/5ML PO SUSP
30.0000 mL | Freq: Four times a day (QID) | ORAL | Status: DC | PRN
  Filled 2021-05-28: qty 30

## 2021-05-28 MED ORDER — RISPERIDONE 3 MG PO TBDP
3.0000 mg | ORAL_TABLET | Freq: Every day | ORAL | Status: DC
  Administered 2021-05-29 – 2021-06-01 (×4): 3 mg via ORAL
  Filled 2021-05-28 (×5): qty 1

## 2021-05-28 MED ORDER — RISPERIDONE 2 MG PO TBDP
4.0000 mg | ORAL_TABLET | Freq: Every day | ORAL | Status: DC
  Administered 2021-05-28 – 2021-05-31 (×4): 4 mg via ORAL
  Filled 2021-05-28 (×6): qty 2

## 2021-05-28 NOTE — Progress Notes (Addendum)
D: Patient is pleasant and cooperative. She is compliant with her medications. She continues to experience paranoia; however, she states it has improved. The only physical complaint patient had was some heartburn and flatulence today; maalox ordered and given. Patient denies any thoughts of self harm and does not appear to be responding to internal stimuli.  A: Continue to monitor medication management and MD orders.  Safety checks completed every 15 minutes per protocol.  Offer support and encouragement as needed.  R: Patient is receptive to staff; her behavior is appropriate.     05/28/21 1100  Psych Admission Type (Psych Patients Only)  Admission Status Involuntary  Psychosocial Assessment  Patient Complaints Anxiety  Eye Contact Fair  Facial Expression Anxious  Affect Anxious  Speech Slow  Interaction Cautious;Guarded  Motor Activity Slow  Appearance/Hygiene Unremarkable  Behavior Characteristics Cooperative;Anxious  Mood Anxious;Pleasant  Thought Process  Coherency WDL  Content WDL  Delusions None reported or observed  Perception WDL  Hallucination None reported or observed  Judgment WDL  Confusion WDL  Danger to Self  Current suicidal ideation? Denies  Danger to Others  Danger to Others None reported or observed

## 2021-05-28 NOTE — Progress Notes (Signed)
Adult Psychoeducational Group Note  Date:  05/28/2021 Time:  8:51 PM  Group Topic/Focus:  Wrap-Up Group:   The focus of this group is to help patients review their daily goal of treatment and discuss progress on daily workbooks.  Participation Level:  Active  Participation Quality:  Appropriate  Affect:  Appropriate  Cognitive:  Appropriate  Insight: Appropriate  Engagement in Group:  Engaged  Modes of Intervention:  Discussion  Additional Comments:  Pt stated her goal for today was to focus on her treatment plan. Pt stated she accomplished her goal today. Pt stated she talked with her doctor and her social worker about her care today. Pt rated her overall day a 9 out of 10. Pt stated she was able to contact her son, mother, and her sister today which improved her overall day. Pt stated she felt better about herself tonight.  Pt stated she was able to attend all meals. Pt stated she took all medications provided today. Pt stated her appetite was pretty good today. Pt rated her sleep last night was pretty good. Pt stated the goal tonight was to get some rest. Pt stated she had no physical pain tonight. Pt deny visual hallucinations and auditory issues tonight. Pt denies thoughts of harming herself or others. Pt stated she would alert staff if anything changed.   Candy Sledge 05/28/2021, 8:51 PM

## 2021-05-28 NOTE — Plan of Care (Signed)
°  Problem: Education: °Goal: Emotional status will improve °Outcome: Progressing °Goal: Mental status will improve °Outcome: Progressing °Goal: Verbalization of understanding the information provided will improve °Outcome: Progressing °  °

## 2021-05-28 NOTE — Group Note (Signed)
  [facility] LCSW Group Therapy Note  Date/Time:  05/28/2021 11:15AM-12:00PM  Type of Therapy and Topic:  Group Therapy:  Feelings About Hospitalization  Participation Level:  Active   Description of Group This process group involved patients discussing their feelings related to being hospitalized, as well as the benefits they see to being in the hospital.  These feelings and benefits were itemized.  The group then brainstormed specific ways in which they could seek those same benefits when they discharge and return home.  Therapeutic Goals Patient will identify and describe positive and negative feelings related to hospitalization Patient will verbalize benefits of hospitalization to themselves personally Patients will brainstorm together ways they can obtain similar benefits in the outpatient setting, identify barriers to wellness and possible solutions  Summary of Patient Progress:  The patient expressed her primary feelings about being hospitalized are that she needs to be here, and that the hospital environment is therapeutic for her.  Therapeutic Modalities Cognitive Behavioral Therapy Motivational Interviewing   Baird Kay, [place] 05/28/2021  12:34 PM

## 2021-05-28 NOTE — Progress Notes (Signed)
Adult Psychoeducational Group Note  Date:  05/28/2021 Time:  2:20 AM  Group Topic/Focus:  Wrap-Up Group:   The focus of this group is to help patients review their daily goal of treatment and discuss progress on daily workbooks.  Participation Level:  Active  Participation Quality:  Appropriate  Affect:  Appropriate  Cognitive:  Appropriate  Insight: Appropriate  Engagement in Group:  Engaged  Modes of Intervention:  Discussion  Additional Comments:  Pt stated her goal for today was to focus on her treatment plan. Pt stated she accomplished her goal today. Pt stated she talked with her doctor and her social worker about her care today. Pt rated her overall day a 10. Pt stated she was able to contact her cousin, mother, and her sister today which improved her overall day. Pt stated she felt better about herself tonight.  Pt stated she was able to attend all meals. Pt stated she took all medications provided today. Pt stated her appetite was pretty good today. Pt rated her sleep last night was pretty good. Pt stated the goal tonight was to get some rest. Pt stated she had no physical pain tonight. Pt deny visual hallucinations and auditory issues tonight. Pt denies thoughts of harming herself or others. Pt stated she would alert staff if anything changed.   Candy Sledge 05/28/2021, 2:20 AM

## 2021-05-28 NOTE — Progress Notes (Signed)
[facility] MD Progress Note  05/28/2021 3:23 PM [name]  MRN:  [id number]  Reason for admission: 45 year old female with a reported psychiatric history of "schizoaffective, bipolar, and personality" who was admitted to this psychiatric unit for disorientation, confusion, hallucinations, suicidal thoughts, and delusions. During the her admission interview, about details of her HPI.  She reports that she was driving from [place], to [place], realized that she was not thinking clearly and confused, and went to the [facility] at [facility] for evaluation.    Recommendations made by the treatment team yesterday:  Continue Risperdal-ODT 3 mg po bid.  Continue Seroquel 50 mg po Q bedtime. Continue Depakote 500 mg po bid. Continue Cogentin to 0.5 mg po bid. Continue Ativan 0.5 mg PO TID, schedule 8A, 2PM, /8PM Continue Vistaril 25 mg po tid prn. Continue Propranolol 10 mg po bid for anxiety/elevated heart rate.  Evaluation on the unit today:   Patient is seen in her room, chart reviewed, case discussed with the treatment team. Resha presents with a much improved/reactive affect, she maintains good eye contact. She is well groomed with good hygiene. She continues to have paranoid thinking and appears to have some difficulty deciding what is real and what is not when she hears other patients talking in the hallway or on the phone. She often believes that she is the target of these conversations. She agrees that her paranoid thinking is much improved since she was admitted. She denies symptoms of depression or anxiety. She stated "I never felt depressed or anxious, I was just afraid that people were breaking into my apartment and stealing my things." She is grandiose at times, especially when talking about her social media presence. She is reluctant to give out her social media name due to other patients hearing it and recognizing her by her face. She denies SI/HI, AVH and delusional thoughts. She does not appear  to to be responding to any internal stimuli. She is taking her medications as prescribed and has no complaint of side effect with the exception of feeling sluggish She reported that this improves once she is up and moving around. She reported a good appetite. Reviewed vital signs; B/P wnl 114/71, pulse slightly elevated 105. She reports sleeping well last night, record reflects she slept 6 hours. We will continue to monitor for safety & titrate medications as needed. Patient is participating in the group sessions. We will continue current plan with the following medication adjustment; increase Risperdal to 3 mg in the AM and 4 mg in the PM to better target her paranoia.   Principal Problem: Schizoaffective disorder, bipolar type ([place])  Diagnosis: Principal Problem:   Schizoaffective disorder, bipolar type ([place]) Active Problems:   Cannabis use disorder, moderate, dependence ([place])  Total Time spent with patient:  25 minutes  Past Psychiatric History: Schizoaffective disorder, bipolar-type.  Past Medical History:  Past Medical History:  Diagnosis Date   Abnormal Pap smear    Bipolar 1 disorder ([place])    Diabetes mellitus without complication (HCC)    Fibroids    Hypertension    IBS (irritable bowel syndrome)     Past Surgical History:  Procedure Laterality Date   CERVICAL BIOPSY     CHOLECYSTECTOMY     ESSURE TUBAL LIGATION     HIATAL HERNIA REPAIR     TUBAL LIGATION     Family History:  Family History  Problem Relation Age of Onset   Diabetes Father    Diabetes Paternal Grandmother  Diabetes Maternal Grandmother    Mental illness Cousin    Healthy Mother    Family Psychiatric  History: See H&P.  Social History:  Social History   Substance and Sexual Activity  Alcohol Use Not Currently   Comment: occasional     Social History   Substance and Sexual Activity  Drug Use Not Currently    Social History   Socioeconomic History   Marital status: Single    Spouse  name: Not on file   Number of children: 2   Years of education: Not on file   Highest education level: Master's degree (e.g., MA, MS, MEng, MEd, MSW, MBA)  Occupational History   Not on file  Tobacco Use   Smoking status: Former    Types: Cigarettes   Smokeless tobacco: Never  Vaping Use   Vaping Use: Some days   Substances: Nicotine, Flavoring  Substance and Sexual Activity   Alcohol use: Not Currently    Comment: occasional   Drug use: Not Currently   Sexual activity: Yes    Birth control/protection: Surgical  Other Topics Concern   Not on file  Social History Narrative   ** Merged History Encounter **       Social Determinants of Health   Financial Resource Strain: Not on file  Food Insecurity: Not on file  Transportation Needs: Not on file  Physical Activity: Not on file  Stress: Not on file  Social Connections: Not on file   Additional Social History:   Sleep:  6.5  Appetite:  Good  Current Medications: Current Facility-Administered Medications  Medication Dose Route Frequency Provider Last Rate Last Admin   acetaminophen (TYLENOL) tablet 650 mg  650 mg Oral Q4H PRN Lindell Spar I, NP   650 mg at 05/20/21 1732   alum & mag hydroxide-simeth (MAALOX/MYLANTA) 200-200-20 MG/5ML suspension 30 mL  30 mL Oral Q6H PRN Massengill, Nathan, MD       atorvastatin (LIPITOR) tablet 80 mg  80 mg Oral Daily Rankin, Shuvon B, NP   80 mg at 05/28/21 0828   divalproex (DEPAKOTE) DR tablet 500 mg  500 mg Oral BID Lindell Spar I, NP   500 mg at 05/28/21 7062   docusate sodium (COLACE) capsule 100 mg  100 mg Oral Daily Lindell Spar I, NP   100 mg at 05/28/21 0829   ferrous sulfate tablet 325 mg  325 mg Oral BID WC Lindell Spar I, NP   325 mg at 05/28/21 3762   hydrOXYzine (ATARAX/VISTARIL) tablet 25 mg  25 mg Oral TID PRN Prescilla Sours, PA-C   25 mg at 05/27/21 2041   insulin aspart (novoLOG) injection 0-5 Units  0-5 Units Subcutaneous QHS Prescilla Sours, PA-C   3 Units at 05/16/21  2254   insulin aspart (novoLOG) injection 0-9 Units  0-9 Units Subcutaneous TID WC Rankin, Shuvon B, NP   1 Units at 05/28/21 1234   insulin glargine-yfgn (SEMGLEE) injection 16 Units  16 Units Subcutaneous Daily Massengill, Ovid Curd, MD   16 Units at 05/28/21 0832   lisinopril (ZESTRIL) tablet 5 mg  5 mg Oral Daily Rankin, Shuvon B, NP   5 mg at 05/28/21 0829   LORazepam (ATIVAN) tablet 0.5 mg  0.5 mg Oral TID Ethelene Hal, NP   0.5 mg at 05/28/21 1345   LORazepam (ATIVAN) tablet 1 mg  1 mg Oral Q6H PRN Lindell Spar I, NP   1 mg at 05/24/21 1516   metFORMIN (GLUCOPHAGE) tablet 1,000 mg  1,000 mg Oral BID WC Rankin, Shuvon B, NP   1,000 mg at 05/28/21 0828   OLANZapine (ZYPREXA) injection 10 mg  10 mg Intramuscular BID PRN Briant Cedar, MD       OLANZapine [facility]) tablet 10 mg  10 mg Oral BID PRN Briant Cedar, MD   10 mg at 05/21/21 1618   propranolol (INDERAL) tablet 10 mg  10 mg Oral BID Janine Limbo, MD   10 mg at 05/28/21 0829   QUEtiapine (SEROQUEL) tablet 50 mg  50 mg Oral QHS Ethelene Hal, NP   50 mg at 05/27/21 2041   [START ON 05/29/2021] risperiDONE (RISPERDAL M-TABS) disintegrating tablet 3 mg  3 mg Oral Daily Ethelene Hal, NP       risperiDONE (RISPERDAL M-TABS) disintegrating tablet 4 mg  4 mg Oral QHS Ethelene Hal, NP       traZODone (DESYREL) tablet 50 mg  50 mg Oral QHS PRN Margorie John W, PA-C   50 mg at 05/23/21 2003   Vitamin D3 (Vitamin D) tablet 1,000 Units  1,000 Units Oral Daily Ethelene Hal, NP   1,000 Units at 05/28/21 6761   Lab Results:  Results for orders placed or performed during the hospital encounter of 05/16/21 (from the past 48 hour(s))  Glucose, capillary     Status: Abnormal   Collection Time: 05/26/21  5:02 PM  Result Value Ref Range   Glucose-Capillary 234 (H) 70 - 99 mg/dL    Comment: Glucose reference range applies only to samples taken after fasting for at least 8 hours.  Glucose,  capillary     Status: Abnormal   Collection Time: 05/26/21  8:30 PM  Result Value Ref Range   Glucose-Capillary 167 (H) 70 - 99 mg/dL    Comment: Glucose reference range applies only to samples taken after fasting for at least 8 hours.  Glucose, capillary     Status: Abnormal   Collection Time: 05/27/21  5:47 AM  Result Value Ref Range   Glucose-Capillary 131 (H) 70 - 99 mg/dL    Comment: Glucose reference range applies only to samples taken after fasting for at least 8 hours.  Glucose, capillary     Status: Abnormal   Collection Time: 05/27/21 11:57 AM  Result Value Ref Range   Glucose-Capillary 126 (H) 70 - 99 mg/dL    Comment: Glucose reference range applies only to samples taken after fasting for at least 8 hours.  Glucose, capillary     Status: Abnormal   Collection Time: 05/27/21  5:09 PM  Result Value Ref Range   Glucose-Capillary 167 (H) 70 - 99 mg/dL    Comment: Glucose reference range applies only to samples taken after fasting for at least 8 hours.  Glucose, capillary     Status: Abnormal   Collection Time: 05/27/21  8:10 PM  Result Value Ref Range   Glucose-Capillary 161 (H) 70 - 99 mg/dL    Comment: Glucose reference range applies only to samples taken after fasting for at least 8 hours.  Glucose, capillary     Status: Abnormal   Collection Time: 05/28/21  5:34 AM  Result Value Ref Range   Glucose-Capillary 179 (H) 70 - 99 mg/dL    Comment: Glucose reference range applies only to samples taken after fasting for at least 8 hours.  Glucose, capillary     Status: Abnormal   Collection Time: 05/28/21 12:12 PM  Result Value Ref Range   Glucose-Capillary 146 (H)  70 - 99 mg/dL    Comment: Glucose reference range applies only to samples taken after fasting for at least 8 hours.   Blood Alcohol level:  Lab Results  Component Value Date   ETH <10 05/12/2021   ETH <10 96/29/5284   Metabolic Disorder Labs: Lab Results  Component Value Date   HGBA1C 9.2 (H) 05/12/2021    MPG 217.34 05/12/2021   MPG 209 08/19/2020   Lab Results  Component Value Date   PROLACTIN 11.7 05/17/2018   PROLACTIN 105.5 (H) 02/28/2017   Lab Results  Component Value Date   CHOL 101 05/17/2021   TRIG 65 05/17/2021   HDL 32 (L) 05/17/2021   CHOLHDL 3.2 05/17/2021   VLDL 13 05/17/2021   LDLCALC 56 05/17/2021   LDLCALC 49 08/18/2020   Physical Findings: AIMS: Facial and Oral Movements Muscles of Facial Expression: None, normal Lips and Perioral Area: None, normal Jaw: None, normal Tongue: None, normal,Extremity Movements Upper (arms, wrists, hands, fingers): None, normal Lower (legs, knees, ankles, toes): None, normal,  Overall Severity Severity of abnormal movements (highest score from questions above): None, normal Incapacitation due to abnormal movements: None, normal Patient's awareness of abnormal movements (rate only patient's report): No Awareness,  Dental Status Current problems with teeth and/or dentures?: No Does patient usually wear dentures?: No  CIWA:    COWS:     Musculoskeletal: Strength & Muscle Tone: within normal limits Gait & Station: normal Patient leans: N/A  Psychiatric Specialty Exam:  Presentation  General Appearance: Appropriate for Environment; Casual; Well Groomed  Eye Contact:Good  Speech:Normal  Speech Volume: Normal  Handedness:Right  Mood and Affect  Mood:Euthymic  Affect: Improving.  Thought Process  Thought Processes:Coherent,   Descriptions of Associations:Intact  Orientation:Full (Time, Place and Person)  Thought Content:Paranoid Ideation  History of Schizophrenia/Schizoaffective disorder:Yes  Duration of Psychotic Symptoms:Greater than six months  Hallucinations:No patient denies  Ideas of Reference:None  Suicidal Thoughts:No, patient denies  Homicidal Thoughts: No, patient denies  Sensorium  Memory:Immediate Good; Recent Good; Remote Fair  Judgment:Fair (Improving  remarkably)  Insight:Fair  Executive Functions  Concentration:Good  Attention Span:Good  [place]  Language:Good  Psychomotor Activity  Psychomotor Activity: Slowed  Assets  Assets:Communication Skills; Desire for Improvement; Financial Resources/Insurance; Housing; Resilience; Social Support; Vocational/Educational; Transportation  Sleep  Sleep: Improving: 6 hrs.  Physical Exam: Physical Exam Vitals and nursing note reviewed.  HENT:     Mouth/Throat:     Pharynx: Oropharynx is clear.  Eyes:     Pupils: Pupils are equal, round, and reactive to light.  Cardiovascular:     Pulses: Normal pulses.  Pulmonary:     Effort: Pulmonary effort is normal.  Genitourinary:    Comments: Deferred Musculoskeletal:        General: Normal range of motion.     Cervical back: Normal range of motion.  Skin:    General: Skin is warm and dry.  Neurological:     Mental Status: She is alert and oriented to person, place, and time.   Review of Systems  Constitutional:  Negative for chills, diaphoresis and fever.  HENT:  Negative for congestion and sore throat.   Eyes:  Negative for blurred vision.  Respiratory:  Negative for cough, shortness of breath and wheezing.   Cardiovascular:  Negative for chest pain and palpitations.  Gastrointestinal:  Negative for abdominal pain, constipation, diarrhea, heartburn, nausea and vomiting.  Genitourinary:  Negative for dysuria.  Musculoskeletal:  Negative for joint pain and myalgias.  Skin: Negative.   Neurological:  Negative for dizziness, tingling, tremors, sensory change, speech change, focal weakness, seizures, loss of consciousness, weakness and headaches.  Psychiatric/Behavioral:  Negative for hallucinations (denies hallucinations), memory loss and suicidal ideas. Substance abuse: Hx. THC use disorder.The patient is not nervous/anxious and does not have insomnia.    Blood pressure 114/71, pulse (!) 105,  temperature 97.6 F (36.4 C), temperature source Oral, resp. rate 16, height 5\' 11"  (1.803 m), weight 114.8 kg, SpO2 100 %. Body mass index is 35.29 kg/m.  Treatment Plan Summary: Daily contact with patient to assess and evaluate symptoms and progress in treatment and Medication management.   Continue inpatient hospitalization.  Will continue today 05/28/2021 plan as below except where it is noted.   Diagnoses:  Schizoaffective disorder, bipolar-type.  Cannabis use disorder.  Other medical issues:  Diabetes Mellitus.  HTN (essential).  Plan.  Schizoaffective disorder:  Increase Risperdal-ODT form 3 mg po bid to 3 mg AM and 4 mg PM, first increase tonight Continue Seroquel 50 mg po Q bedtime. Continue Depakote 500 mg po bid.  EPS prevention. Continue Cogentin to 0.5 mg po bid.  Anxiety.  Continue Ativan 0.5 mg PO TID, schedule 8A, 2PM, /8PM Continue Vistaril 25 mg po tid prn. Continue Propranolol 10 mg po bid for anxiety/elevated heart rate.  Insomnia.  Continue Trazodone 50 mg po Q hs prn.  UTI:  Continue Bactrim Ds 800-160 mg po bid x 7 days :(completed).  Vitamin D deficiency:  Continue Vitamin D 1,000 units daily  Agitation/psychosis.  Continue Olanzapine 10 mg po or IM bid prn. Continue lorazepam 1 mg po Q 6 hrs prn for anxiety.  Other medical issues, continue:  Lipitor 80 mg po daily for high cholesterol.  Insulin glargine-yfgn 16 units subQ daily for DM. Metformin 1,000 mg po bid for diabetes mellitus. Lisinopril 5 mg po daily for HTN. Continue Ferrous sulfate 325 mg po bid for anemia. Continue the sliding scale insulin as recommended per CBG results.  Labs reviewed: CMP with albumin 3.2, AST 14, Total protein 6.4. Vitamin D 17.60. Vitamin B12 759. CBC with H/H 10.4/33.5, MCH 77.5, MCH 24.1, RDW 16.7. Glucose 190. TSH 0.408, Free T4 1.11. Valproic acid level 64.   Labs result Vitamin B1: 95.1  Encourage group attendance/participation. Discharge  disposition plan in progress.  Ethelene Hal, NP 05/28/2021, 3:23 PM

## 2021-05-28 NOTE — Progress Notes (Signed)
Pt was encouraged but didn't attend orientation/goals group.

## 2021-05-28 NOTE — [facility] Group Notes (Signed)
.  Psychoeducational Group Note  Date: 05/28/2021 Time: 0900-1000    Goal Setting   Purpose of Group: This group helps to provide patients with the steps of setting a goal that is specific, measurable, attainable, realistic and time specific. A discussion on how we keep ourselves stuck with negative self talk. Homework given for Patients to write 30 positive attributes about themselves.    Participation Level:  Active  Participation Quality:  Appropriate  Affect:  Appropriate  Cognitive:  Appropriate  Insight:  Improving  Engagement in Group:  Engaged  Additional Comments:  Pt attended the group. Pt has improved remarkably since last weekend.. 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 She also stated she worked in [employer].  Paulino Rily

## 2021-05-28 NOTE — Progress Notes (Signed)
   On assessment, pt presents with anxiety and a complaint of bilateral ankle and foot pain, swelling, burning and tingling.  Provider notified and assessed the pt.  Administered PRN Tylenol per MAR per pt request.  Requested pt keep feet elevated above heart tonight while in bed.  Pt denies SI/HI and verbally contracts for safety.  Pt denies AVH.  Pt is safe on unit with Q 15 minute safety checks.    05/28/21 2203  Psych Admission Type (Psych Patients Only)  Admission Status Involuntary  Psychosocial Assessment  Patient Complaints Anxiety  Eye Contact Fair  Facial Expression Anxious;Pained  Affect Anxious  Speech Slow;Logical/coherent  Interaction Cautious;Guarded  Motor Activity Slow  Appearance/Hygiene Unremarkable  Behavior Characteristics Cooperative;Anxious  Mood Pleasant;Anxious  Thought Process  Coherency WDL  Content WDL  Delusions None reported or observed  Perception WDL  Hallucination None reported or observed  Judgment WDL  Confusion WDL  Danger to Self  Current suicidal ideation? Denies  Danger to Others  Danger to Others None reported or observed

## 2021-05-29 DIAGNOSIS — F250 Schizoaffective disorder, bipolar type: Secondary | ICD-10-CM | POA: Diagnosis not present

## 2021-05-29 LAB — GLUCOSE, CAPILLARY
Glucose-Capillary: 195 mg/dL — ABNORMAL HIGH (ref 70–99)
Glucose-Capillary: 184 mg/dL — ABNORMAL HIGH (ref 70–99)
Glucose-Capillary: 216 mg/dL — ABNORMAL HIGH (ref 70–99)
Glucose-Capillary: 184 mg/dL — ABNORMAL HIGH (ref 70–99)

## 2021-05-29 MED ORDER — LORAZEPAM 0.5 MG PO TABS
0.5000 mg | ORAL_TABLET | Freq: Two times a day (BID) | ORAL | Status: DC
  Administered 2021-05-29 – 2021-06-01 (×6): 0.5 mg via ORAL
  Filled 2021-05-29 (×6): qty 1

## 2021-05-29 MED ORDER — CLONAZEPAM 0.5 MG PO TABS: 0.5000 mg | ORAL_TABLET | Freq: Two times a day (BID) | ORAL | Status: DC

## 2021-05-29 NOTE — [facility] Group Notes (Signed)
[facility] Group Notes:  (Nursing/MHT/Case Management/Adjunct)  Date:  05/29/2021  Time:  4:57 PM  Type of Therapy:  Psychoeducational Skills  Participation Level:  Active  Participation Quality:  Appropriate  Affect:  Appropriate  Cognitive:  Appropriate  Insight:  Appropriate  Engagement in Group:  Engaged  Modes of Intervention:  Activity, Discussion, Education, and Support  Summary of Progress/Problems: Group was consisted of reading an article called "yesterday, Today and Tomorrow" and group discussion and education focused on how to only focus on things that are in your control. Not to worry about yesterday or the tomorrow but focus on the present and to just learn to live in the moment.  Carle Fenech J Eytan Carrigan 05/29/2021, 4:57 PM

## 2021-05-29 NOTE — Progress Notes (Signed)
[facility] MD Progress Note  05/29/2021 9:52 AM [name]  MRN:  [id number]  Reason for admission: 45 year old female with a reported psychiatric history of "schizoaffective, bipolar, and personality" who was admitted to this psychiatric unit for disorientation, confusion, hallucinations, suicidal thoughts, and delusions. During the her admission interview, about details of her HPI.  She reports that she was driving from [place], to [place], realized that she was not thinking clearly and confused, and went to the [facility] at [facility] for evaluation.    Recommendations made by the treatment team yesterday:  Increase Risperdal-ODT from 3 mg po bid to 3 mg AM and 4 mg PM Continue Seroquel 50 mg po Q bedtime. Continue Depakote 500 mg po bid. Continue Cogentin to 0.5 mg po bid. Continue Ativan 0.5 mg PO TID, schedule 8A, 2PM, /8PM Continue Vistaril 25 mg po tid prn. Continue Propranolol 10 mg po bid for anxiety/elevated heart rate.  Evaluation on the unit today:   Patient is seen in the alcove on the 500 hal, chart reviewed and case discussed with the treatment team. [name] presents as calm and cooperative, she maintains good eye contact. She is well groomed with good hygiene. She continues to have paranoid thinking. Today, per nursing, she was extremely paranoid about her medications. She was insistent with this provider that she is on too much Risperdal. Her speech is slowed today. She appears tired. She stated she needs the doctor to fix her medications. Her paranoid thoughts were not as prominent today, however she continues to perseverate about labs, medications and people hearing her her when she is talking and stealing her information. She has non-pitting edema to her bilateral feet. She is not complaining of pain and stated she can ambulate without difficulty. She denies SI/HI, AVH and delusional thoughts. She does not appear to to be responding to internal stimuli. She is taking her medications as  prescribed and has no complaint of side effects with the exception of feeling sluggish during the day. She did attend group this morning and is visible on the unit at times. She did not go to the cafeteria for lunch. At first she said she was going to go but at the last minute passed the MHT a note that said "I stay back OK." Reviewed vital signs; B/P wnl 120/83, pulse 91. Her CBG at lunch was  184, requiring 2 units SSI for meal coverage. She reports sleeping hard last night, record reflects she slept 5.75 hours. She blames her hard sleep on too much Risperdal. We will draw labs in the AM due to her bilateral foot swelling. We will continue to monitor for safety & titrate medications as needed.   Principal Problem: Schizoaffective disorder, bipolar type ([place])  Diagnosis: Principal Problem:   Schizoaffective disorder, bipolar type ([place]) Active Problems:   Cannabis use disorder, moderate, dependence ([place])  Total Time spent with patient:  25 minutes  Past Psychiatric History: Schizoaffective disorder, bipolar-type.  Past Medical History:  Past Medical History:  Diagnosis Date   Abnormal Pap smear    Bipolar 1 disorder ([place])    Diabetes mellitus without complication ([place])    Fibroids    Hypertension    IBS (irritable bowel syndrome)     Past Surgical History:  Procedure Laterality Date   CERVICAL BIOPSY     CHOLECYSTECTOMY     ESSURE TUBAL LIGATION     HIATAL HERNIA REPAIR     TUBAL LIGATION     Family History:  Family History  Problem Relation Age of Onset   Diabetes Father    Diabetes Paternal Grandmother    Diabetes Maternal Grandmother    Mental illness Cousin    Healthy Mother    Family Psychiatric  History: See H&P.  Social History:  Social History   Substance and Sexual Activity  Alcohol Use Not Currently   Comment: occasional     Social History   Substance and Sexual Activity  Drug Use Not Currently    Social History   Socioeconomic History   Marital  status: Single    Spouse name: Not on file   Number of children: 2   Years of education: Not on file   Highest education level: Master's degree (e.g., MA, MS, MEng, MEd, MSW, MBA)  Occupational History   Not on file  Tobacco Use   Smoking status: Former    Types: Cigarettes   Smokeless tobacco: Never  Vaping Use   Vaping Use: Some days   Substances: Nicotine, Flavoring  Substance and Sexual Activity   Alcohol use: Not Currently    Comment: occasional   Drug use: Not Currently   Sexual activity: Yes    Birth control/protection: Surgical  Other Topics Concern   Not on file  Social History Narrative   ** Merged History Encounter **       Social Determinants of Health   Financial Resource Strain: Not on file  Food Insecurity: Not on file  Transportation Needs: Not on file  Physical Activity: Not on file  Stress: Not on file  Social Connections: Not on file   Additional Social History:   Sleep:  6.5  Appetite:  Good  Current Medications: Current Facility-Administered Medications  Medication Dose Route Frequency Provider Last Rate Last Admin   acetaminophen (TYLENOL) tablet 650 mg  650 mg Oral Q4H PRN Lindell Spar I, NP   650 mg at 05/29/21 0620   alum & mag hydroxide-simeth (MAALOX/MYLANTA) 200-200-20 MG/5ML suspension 30 mL  30 mL Oral Q6H PRN Massengill, Ovid Curd, MD       atorvastatin (LIPITOR) tablet 80 mg  80 mg Oral Daily Rankin, Shuvon B, NP   80 mg at 05/29/21 0812   divalproex (DEPAKOTE) DR tablet 500 mg  500 mg Oral BID Lindell Spar I, NP   500 mg at 05/29/21 0813   docusate sodium (COLACE) capsule 100 mg  100 mg Oral Daily Lindell Spar I, NP   100 mg at 05/29/21 0174   ferrous sulfate tablet 325 mg  325 mg Oral BID WC Nwoko, Herbert Pun I, NP   325 mg at 05/29/21 0811   hydrOXYzine (ATARAX/VISTARIL) tablet 25 mg  25 mg Oral TID PRN Prescilla Sours, PA-C   25 mg at 05/27/21 2041   insulin aspart (novoLOG) injection 0-5 Units  0-5 Units Subcutaneous QHS Prescilla Sours,  PA-C   3 Units at 05/16/21 2254   insulin aspart (novoLOG) injection 0-9 Units  0-9 Units Subcutaneous TID WC Rankin, Shuvon B, NP   2 Units at 05/29/21 0620   insulin glargine-yfgn (SEMGLEE) injection 16 Units  16 Units Subcutaneous Daily Massengill, Ovid Curd, MD   16 Units at 05/29/21 0815   lisinopril (ZESTRIL) tablet 5 mg  5 mg Oral Daily Rankin, Shuvon B, NP   5 mg at 05/29/21 0818   LORazepam (ATIVAN) tablet 0.5 mg  0.5 mg Oral TID Ethelene Hal, NP   0.5 mg at 05/29/21 0818   LORazepam (ATIVAN) tablet 1 mg  1 mg Oral Q6H PRN  Lindell Spar I, NP   1 mg at 05/24/21 1516   metFORMIN (GLUCOPHAGE) tablet 1,000 mg  1,000 mg Oral BID WC Rankin, Shuvon B, NP   1,000 mg at 05/29/21 0811   OLANZapine (ZYPREXA) injection 10 mg  10 mg Intramuscular BID PRN Briant Cedar, MD       OLANZapine [facility]) tablet 10 mg  10 mg Oral BID PRN Briant Cedar, MD   10 mg at 05/21/21 1618   propranolol (INDERAL) tablet 10 mg  10 mg Oral BID Massengill, Ovid Curd, MD   10 mg at 05/29/21 0820   QUEtiapine (SEROQUEL) tablet 50 mg  50 mg Oral QHS Ethelene Hal, NP   50 mg at 05/28/21 2120   risperiDONE (RISPERDAL M-TABS) disintegrating tablet 3 mg  3 mg Oral Daily Ethelene Hal, NP   3 mg at 05/29/21 0818   risperiDONE (RISPERDAL M-TABS) disintegrating tablet 4 mg  4 mg Oral QHS Ethelene Hal, NP   4 mg at 05/28/21 2119   traZODone (DESYREL) tablet 50 mg  50 mg Oral QHS PRN Prescilla Sours, PA-C   50 mg at 05/23/21 2003   Vitamin D3 (Vitamin D) tablet 1,000 Units  1,000 Units Oral Daily Ethelene Hal, NP   1,000 Units at 05/29/21 0818   Lab Results:  Results for orders placed or performed during the hospital encounter of 05/16/21 (from the past 48 hour(s))  Glucose, capillary     Status: Abnormal   Collection Time: 05/27/21 11:57 AM  Result Value Ref Range   Glucose-Capillary 126 (H) 70 - 99 mg/dL    Comment: Glucose reference range applies only to samples taken after  fasting for at least 8 hours.  Glucose, capillary     Status: Abnormal   Collection Time: 05/27/21  5:09 PM  Result Value Ref Range   Glucose-Capillary 167 (H) 70 - 99 mg/dL    Comment: Glucose reference range applies only to samples taken after fasting for at least 8 hours.  Glucose, capillary     Status: Abnormal   Collection Time: 05/27/21  8:10 PM  Result Value Ref Range   Glucose-Capillary 161 (H) 70 - 99 mg/dL    Comment: Glucose reference range applies only to samples taken after fasting for at least 8 hours.  Glucose, capillary     Status: Abnormal   Collection Time: 05/28/21  5:34 AM  Result Value Ref Range   Glucose-Capillary 179 (H) 70 - 99 mg/dL    Comment: Glucose reference range applies only to samples taken after fasting for at least 8 hours.  Glucose, capillary     Status: Abnormal   Collection Time: 05/28/21 12:12 PM  Result Value Ref Range   Glucose-Capillary 146 (H) 70 - 99 mg/dL    Comment: Glucose reference range applies only to samples taken after fasting for at least 8 hours.  Glucose, capillary     Status: Abnormal   Collection Time: 05/28/21  4:56 PM  Result Value Ref Range   Glucose-Capillary 159 (H) 70 - 99 mg/dL    Comment: Glucose reference range applies only to samples taken after fasting for at least 8 hours.  Glucose, capillary     Status: Abnormal   Collection Time: 05/28/21  7:37 PM  Result Value Ref Range   Glucose-Capillary 181 (H) 70 - 99 mg/dL    Comment: Glucose reference range applies only to samples taken after fasting for at least 8 hours.  Glucose, capillary  Status: Abnormal   Collection Time: 05/29/21  5:44 AM  Result Value Ref Range   Glucose-Capillary 195 (H) 70 - 99 mg/dL    Comment: Glucose reference range applies only to samples taken after fasting for at least 8 hours.   Blood Alcohol level:  Lab Results  Component Value Date   ETH <10 05/12/2021   ETH <10 17/00/1749   Metabolic Disorder Labs: Lab Results  Component  Value Date   HGBA1C 9.2 (H) 05/12/2021   MPG 217.34 05/12/2021   MPG 209 08/19/2020   Lab Results  Component Value Date   PROLACTIN 11.7 05/17/2018   PROLACTIN 105.5 (H) 02/28/2017   Lab Results  Component Value Date   CHOL 101 05/17/2021   TRIG 65 05/17/2021   HDL 32 (L) 05/17/2021   CHOLHDL 3.2 05/17/2021   VLDL 13 05/17/2021   LDLCALC 56 05/17/2021   LDLCALC 49 08/18/2020   Physical Findings: AIMS: Facial and Oral Movements Muscles of Facial Expression: None, normal Lips and Perioral Area: None, normal Jaw: None, normal Tongue: None, normal,Extremity Movements Upper (arms, wrists, hands, fingers): None, normal Lower (legs, knees, ankles, toes): None, normal,  Overall Severity Severity of abnormal movements (highest score from questions above): None, normal Incapacitation due to abnormal movements: None, normal Patient's awareness of abnormal movements (rate only patient's report): No Awareness,  Dental Status Current problems with teeth and/or dentures?: No Does patient usually wear dentures?: No  CIWA:    COWS:     Musculoskeletal: Strength & Muscle Tone: within normal limits Gait & Station: normal Patient leans: N/A  Psychiatric Specialty Exam:  Presentation  General Appearance: Appropriate for Environment; Casual; Well Groomed  Eye Contact:Good  Speech:Normal  Speech Volume: Normal  Handedness:Right  Mood and Affect  Mood:Euthymic  Affect: Improving.  Thought Process  Thought Processes:Coherent,   Descriptions of Associations:Intact  Orientation:Full (Time, Place and Person)  Thought Content:Paranoid Ideation  History of Schizophrenia/Schizoaffective disorder:Yes  Duration of Psychotic Symptoms:Greater than six months  Hallucinations:No patient denies  Ideas of Reference:None  Suicidal Thoughts:No, patient denies  Homicidal Thoughts: No, patient denies  Sensorium  Memory:Immediate Good; Recent Good; Remote Fair  Judgment:Fair  (Improving remarkably)  Insight:Fair  Executive Functions  Concentration:Good  Attention Span:Good  [place]  Language:Good  Psychomotor Activity  Psychomotor Activity: Slowed  Assets  Assets:Communication Skills; Desire for Improvement; Financial Resources/Insurance; Housing; Resilience; Social Support; Vocational/Educational; Transportation  Sleep  Sleep: Improving: 6 hrs.  Physical Exam: Physical Exam Vitals and nursing note reviewed.  HENT:     Mouth/Throat:     Pharynx: Oropharynx is clear.  Eyes:     Pupils: Pupils are equal, round, and reactive to light.  Cardiovascular:     Pulses: Normal pulses.  Pulmonary:     Effort: Pulmonary effort is normal.  Genitourinary:    Comments: Deferred Musculoskeletal:        General: Normal range of motion.     Cervical back: Normal range of motion.  Skin:    General: Skin is warm and dry.  Neurological:     Mental Status: She is alert and oriented to person, place, and time.   Review of Systems  Constitutional:  Negative for chills, diaphoresis and fever.  HENT:  Negative for congestion and sore throat.   Eyes:  Negative for blurred vision.  Respiratory:  Negative for cough, shortness of breath and wheezing.   Cardiovascular:  Negative for chest pain and palpitations.  Gastrointestinal:  Negative for abdominal pain, constipation,  diarrhea, heartburn, nausea and vomiting.  Genitourinary:  Negative for dysuria.  Musculoskeletal:  Negative for joint pain and myalgias.  Skin: Negative.   Neurological:  Negative for dizziness, tingling, tremors, sensory change, speech change, focal weakness, seizures, loss of consciousness, weakness and headaches.  Psychiatric/Behavioral:  Negative for hallucinations (denies hallucinations), memory loss and suicidal ideas. Substance abuse: Hx. THC use disorder.The patient is not nervous/anxious and does not have insomnia.    Blood pressure 120/83, pulse 91,  temperature 97.6 F (36.4 C), temperature source Oral, resp. rate 16, height 5\' 11"  (1.803 m), weight 114.8 kg, SpO2 99 %. Body mass index is 35.29 kg/m.  Treatment Plan Summary: Daily contact with patient to assess and evaluate symptoms and progress in treatment and Medication management.   Continue inpatient hospitalization.  Will continue today 05/29/2021 plan as below except where it is noted.   Diagnoses:  Schizoaffective disorder, bipolar-type.  Cannabis use disorder.  Other medical issues:  Diabetes Mellitus.  HTN (essential).  Plan.  Schizoaffective disorder:  Increase Risperdal-ODT form 3 mg po bid to 3 mg AM and 4 mg PM, first increase tonight Continue Seroquel 50 mg po Q bedtime. Continue Depakote 500 mg po bid.  EPS prevention. Continue Cogentin to 0.5 mg po bid.  Anxiety.  Continue Ativan 0.5 mg PO TID, schedule 8A, 2PM, /8PM Continue Vistaril 25 mg po tid prn. Continue Propranolol 10 mg po bid for anxiety/elevated heart rate.  Insomnia.  Continue Trazodone 50 mg po Q hs prn.  UTI:  Continue Bactrim Ds 800-160 mg po bid x 7 days :(completed).  Vitamin D deficiency:  Continue Vitamin D 1,000 units daily  Agitation/psychosis.  Continue Olanzapine 10 mg po or IM bid prn. Continue lorazepam 1 mg po Q 6 hrs prn for anxiety.  Other medical issues, continue:  Lipitor 80 mg po daily for high cholesterol.  Insulin glargine-yfgn 16 units subQ daily for DM. Metformin 1,000 mg po bid for diabetes mellitus. Lisinopril 5 mg po daily for HTN. Continue Ferrous sulfate 325 mg po bid for anemia. Continue the sliding scale insulin as recommended per CBG results.  Labs ordered for AM draw tomorrow: CMP, CBC with Diff, BNP and Valproic Acid level  Labs results reviewed on 11/22: CMP with albumin 3.2, AST 14, Total protein 6.4. Vitamin D 17.60. Vitamin B12 759. CBC with H/H 10.4/33.5, MCH 77.5, MCH 24.1, RDW 16.7. Glucose 190. TSH 0.408, Free T4 1.11. Valproic acid level  64. Vitamin B1: 95.1    Encourage group attendance/participation. Discharge disposition plan in progress.  Ethelene Hal, NP 05/29/2021, 1:16 PM
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
## 2021-05-29 NOTE — Progress Notes (Addendum)
   Pt reports 6/10 bilateral ankle & foot pain.  Assessed both and swelling remains, with a little less swelling in the right foot.  Pt reports most of the pain is coming from left ankle/foot.  Administered PRN Tylenol per [facility] per pt request.  Provider notified of updated assessment.  Per provider will report to dayshift RN for further assessment by the doctor.

## 2021-05-29 NOTE — BHH Suicide Risk Assessment (Signed)
[name] INPATIENT:  Family/Significant Other Suicide Prevention Education  Suicide Prevention Education:  Education Completed; cousin, [name] [phone],  (name of family member/significant other) has been identified by the patient as the family member/significant other with whom the patient will be residing, and identified as the person(s) who will aid the patient in the event of a mental health crisis (suicidal ideations/suicide attempt).  With written consent from the patient, the family member/significant other has been provided the following suicide prevention education, prior to the and/or following the discharge of the patient.  The suicide prevention education provided includes the following: Suicide risk factors Suicide prevention and interventions National Suicide Hotline telephone number [facility] assessment telephone number [facility] Emergency Assistance [place] and/or Residential Mobile Crisis Unit telephone number  Request made of family/significant other to: Remove weapons (e.g., guns, rifles, knives), all items previously/currently identified as safety concern.   Remove drugs/medications (over-the-counter, prescriptions, illicit drugs), all items previously/currently identified as a safety concern.  The family member/significant other verbalizes understanding of the suicide prevention education information provided.  The family member/significant other agrees to remove the items of safety concern listed above.  Cousin works in a [employer] and is already aware of this information.  She is helping the patient to transition to living in [place] where she will have more family supports.  Berlin Hun Grossman-Orr 05/29/2021, 3:28 PM

## 2021-05-29 NOTE — BHH Counselor (Addendum)
Clinical Social Work Note  CSW spoke with patient about her discharge and received verbal consent to talk with her cousin Burnice Vassel [phone] to confirm discharge plan for Wednesday 11/30.  Cousin confirmed that she is helping the patient to transition to living in [place].  She wanted to know that it is the doctor who has decided she is ready to discharge, not just something that the patient is reporting.  CSW confirmed that doctor has determined her to be stable for discharge on Wednesday 11/30.  Either cousin Thirza Pellicano or uncle Minka Knight will pick up the patient on Wednesday 11/30 in order to transport to [place].  Cousin will alert patient's sister to the upcoming discharge so that everyone "is on the same page."  The hospital address was provided.  Selmer Dominion, LCSW 05/29/2021, 3:07 PM

## 2021-05-29 NOTE — Progress Notes (Signed)
   05/29/21 0812  Psych Admission Type (Psych Patients Only)  Admission Status Involuntary  Psychosocial Assessment  Patient Complaints Anxiety  Eye Contact Fair  Facial Expression Anxious;Pained  Affect Anxious  Speech Slow;Logical/coherent  Interaction Cautious;Guarded  Motor Activity Slow  Appearance/Hygiene Unremarkable  Behavior Characteristics Anxious;Guarded  Mood Depressed;Anxious;Suspicious  Thought Process  Coherency WDL  Content WDL  Delusions None reported or observed  Perception WDL  Hallucination None reported or observed  Judgment WDL  Confusion WDL  Danger to Self  Current suicidal ideation? Denies  Danger to Others  Danger to Others None reported or observed   D: Patient denies SI/HI/AVH. Pt. Denies anxiety and depresson. Pt. Had bilateral non-pitting edema. Pt. Questioned every med. Pt. Statted, "I want a new nurse"  Pt. Isolated in room.  A:  Patient took scheduled medicine.  Support and encouragement provided Routine safety checks conducted every 15 minutes. Patient  Informed to notify staff with any concerns.   R:  Safety maintained.

## 2021-05-29 NOTE — Progress Notes (Signed)
Pt was encouraged but didn't attend orientation/goals group.

## 2021-05-29 NOTE — Group Note (Signed)
[facility] LCSW Group Therapy Note  Date/Time:  05/29/2021  11:00AM-12:00PM  Type of Therapy and Topic:  Group Therapy:  Music and Mood  Participation Level:  Did Not Attend   Description of Group: In this process group, members listened to a variety of genres of music and identified that different types of music evoke different responses.  Patients were encouraged to identify music that was soothing for them and music that was energizing for them.  Patients discussed how this knowledge can help with wellness and recovery in various ways including managing depression and anxiety as well as encouraging healthy sleep habits.    Therapeutic Goals: Patients will explore the impact of different varieties of music on mood Patients will verbalize the thoughts they have when listening to different types of music Patients will identify music that is soothing to them as well as music that is energizing to them Patients will discuss how to use this knowledge to assist in maintaining wellness and recovery Patients will explore the use of music as a coping skill  Summary of Patient Progress:  At the beginning of group, patient  came into the room, but left abruptly once she asked what group was about.  She was reminded that she liked the music group last week, but again said she did not listen to anybody else's music, so she left the room.  Therapeutic Modalities: Solution Focused Brief Therapy Activity   Selmer Dominion, LCSW

## 2021-05-30 LAB — COMPREHENSIVE METABOLIC PANEL
ALT: 18 U/L (ref 0–44)
AST: 15 U/L (ref 15–41)
Albumin: 3.2 g/dL — ABNORMAL LOW (ref 3.5–5.0)
Alkaline Phosphatase: 58 U/L (ref 38–126)
Anion gap: 9 (ref 5–15)
BUN: 10 mg/dL (ref 6–20)
CO2: 24 mmol/L (ref 22–32)
Calcium: 8.8 mg/dL — ABNORMAL LOW (ref 8.9–10.3)
Chloride: 103 mmol/L (ref 98–111)
Creatinine, Ser: 0.63 mg/dL (ref 0.44–1.00)
GFR, Estimated: >60 mL/min (ref >60)
Glucose, Bld: 193 mg/dL — ABNORMAL HIGH (ref 70–99)
Potassium: 4.1 mmol/L (ref 3.5–5.1)
Sodium: 136 mmol/L (ref 135–145)
Total Bilirubin: 0.6 mg/dL (ref 0.3–1.2)
Total Protein: 6 g/dL — ABNORMAL LOW (ref 6.5–8.1)

## 2021-05-30 LAB — GLUCOSE, CAPILLARY
Glucose-Capillary: 177 mg/dL — ABNORMAL HIGH (ref 70–99)
Glucose-Capillary: 189 mg/dL — ABNORMAL HIGH (ref 70–99)
Glucose-Capillary: 200 mg/dL — ABNORMAL HIGH (ref 70–99)
Glucose-Capillary: 198 mg/dL — ABNORMAL HIGH (ref 70–99)

## 2021-05-30 LAB — CBC WITH DIFFERENTIAL/PLATELET
Abs Immature Granulocytes: 0.03 10*3/uL (ref 0.00–0.07)
Basophils Absolute: 0.1 10*3/uL (ref 0.0–0.1)
Basophils Relative: 1 %
Eosinophils Absolute: 0.2 10*3/uL (ref 0.0–0.5)
Eosinophils Relative: 2 %
HCT: 34.3 % — ABNORMAL LOW (ref 36.0–46.0)
Hemoglobin: 10.6 g/dL — ABNORMAL LOW (ref 12.0–15.0)
Immature Granulocytes: 0 %
Lymphocytes Relative: 35 %
Lymphs Abs: 2.9 10*3/uL (ref 0.7–4.0)
MCH: 24.4 pg — ABNORMAL LOW (ref 26.0–34.0)
MCHC: 30.9 g/dL (ref 30.0–36.0)
MCV: 78.9 fL — ABNORMAL LOW (ref 80.0–100.0)
Monocytes Absolute: 0.7 10*3/uL (ref 0.1–1.0)
Monocytes Relative: 9 %
Neutro Abs: 4.4 10*3/uL (ref 1.7–7.7)
Neutrophils Relative %: 53 %
Platelets: 310 10*3/uL (ref 150–400)
RBC: 4.35 MIL/uL (ref 3.87–5.11)
RDW: 17.9 % — ABNORMAL HIGH (ref 11.5–15.5)
WBC: 8.3 10*3/uL (ref 4.0–10.5)
nRBC: 0 % (ref 0.0–0.2)

## 2021-05-30 LAB — BRAIN NATRIURETIC PEPTIDE: B Natriuretic Peptide: 57.1 pg/mL (ref 0.0–100.0)

## 2021-05-30 LAB — VALPROIC ACID LEVEL: Valproic Acid Lvl: 62 ug/mL (ref 50.0–100.0)

## 2021-05-30 MED ORDER — BENZTROPINE MESYLATE 0.5 MG PO TABS
0.5000 mg | ORAL_TABLET | Freq: Two times a day (BID) | ORAL | Status: DC
  Administered 2021-05-30 – 2021-06-01 (×5): 0.5 mg via ORAL
  Filled 2021-05-30 (×7): qty 1

## 2021-05-30 NOTE — Progress Notes (Signed)
Patient was cooperative with treatment, she was visible in the dayroom during the evening, she denies SI/HI &AVH. Patient had no newbehavioral issues to report on shift at this time.

## 2021-05-30 NOTE — Progress Notes (Signed)
DAR NOTE: Patient presents with a flat affect and depressed mood.  Denies suicidal thoughts, auditory and visual hallucinations.  Described energy level as normal with good concentration on self inventory form.  Patient thoughts/speech are clear and she is  goal oriented. Rates depression at 0, hopelessness at 0, and anxiety at 3.  Maintained on routine safety checks.  Medications given as prescribed.  Support and encouragement offered as needed.  Attended group and participated.  States goal for today is "meds/self-care/awaiting discharge."  Patient visible in milieu briefly to use phone.   Offered no complaint.  Patient is safe on and off the unit.

## 2021-05-30 NOTE — BHH Group Notes (Signed)
[facility] Group Notes:  (Nursing/MHT/Case Management/Adjunct)  Date:  05/30/2021  Time:  10:10 AM  Type of Therapy:   Orientation/Goals group  Participation Level:  Minimal  Participation Quality:  Appropriate  Affect:  Appropriate  Cognitive:  Appropriate  Insight:  Improving  Engagement in Group:  Engaged and Improving  Modes of Intervention:  Discussion, Education, and Orientation  Summary of Progress/Problems: pt goal for today is to take medications, focus on self-care and be hopeful to work on discharge plan.   Analilia Geddis J Maisey Deandrade 05/30/2021, 10:10 AM

## 2021-05-30 NOTE — Progress Notes (Signed)
Pt was encouraged but didn't attend therapeutic relaxation group. ?

## 2021-05-30 NOTE — Progress Notes (Addendum)
[facility] MD Progress Note  05/30/2021 3:10 PM [name]  MRN:  [id number]  Reason for admission: 45 year old female with a reported psychiatric history of "schizoaffective, bipolar, and personality" who was admitted to this psychiatric unit for disorientation, confusion, hallucinations, suicidal thoughts, and delusions. During the her admission interview, about details of her HPI.  She reports that she was driving from [place], to [place], realized that she was not thinking clearly and confused, and went to the [facility] at [facility] for evaluation.    Recommendations made by the treatment team yesterday:  Continue Risperdal-ODT 3 mg AM and 4 mg PM Continue Seroquel 50 mg po Q bedtime. Continue Depakote 500 mg po bid. Continue Cogentin to 0.5 mg po bid. Change Ativan 0.5 mg from TID to BID  Continue Vistaril 25 mg po tid prn. Continue Propranolol 10 mg po bid for anxiety/elevated heart rate.  Evaluation on the unit today:    For this encounter, pt is seen in her room on the 500 hall, chart reviewed and case discussed with the treatment team. Patient with good eye contact, is calm and cooperative, attention to personal hygiene is good, and she is dressed in regular clothing. She reports that she is doing well today, concentration is good, speech is clear and more coherent today than reported yesterday. Pt denies SI/HI/AVH, but has some delusional thoughts, and ruminates about having a business, a call center and a new contract that she has signed which will start on 12/05. 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 Pt also with some paranoia, and states that there is a patient on the unit that talks about everyone, and she just hopes that the patient will "mind their own business". Pt  lowered her voice and whispered her words when discussing having a business, and about the other patient in the hall.  Pt reports a good appetite, reports that she is going to the cafeteria and is eating all of her meals for breakfast, lunch, dinner and snacks in between. Pt also reports that her sleep quality last night was good. As per flow sheets, she slept a total of 5.75 hrs last night, and reports not being tired this morning. Pt's attention and concentration is good, pt is ambulating with a steady gait, and denies having any current medication side effects.   Pt with flat affect, when asked about her mood, she states: "I'm moody, but I'm not jumping to do anything. Everything is fine". Pt denies any numbness, tingling sensations, and states that her last bowel movement was this morning. Pt is taking all of her medications as scheduled, and is attending group sessions. Pt is continuing to make progress on the unit.   Principal Problem: Schizoaffective disorder, bipolar type ([place])  Diagnosis: Principal Problem:   Schizoaffective disorder, bipolar type ([place]) Active Problems:   Cannabis use disorder, moderate, dependence ([facility])  Total Time spent with patient:  25 minutes  Past Psychiatric History: Schizoaffective disorder, bipolar-type.  Past Medical History:  Past Medical History:  Diagnosis Date   Abnormal Pap smear    Bipolar 1 disorder ([place])    Diabetes mellitus without complication (HCC)    Fibroids    Hypertension    IBS (irritable bowel syndrome)     Past Surgical History:  Procedure Laterality Date   CERVICAL BIOPSY     CHOLECYSTECTOMY     ESSURE TUBAL LIGATION     HIATAL HERNIA REPAIR     TUBAL LIGATION     Family History:  Family History  Problem Relation Age of Onset   Diabetes Father    Diabetes Paternal Grandmother    Diabetes Maternal Grandmother    Mental illness Cousin    Healthy Mother    Family Psychiatric  History: See H&P.  Social History:  Social  History   Substance and Sexual Activity  Alcohol Use Not Currently   Comment: occasional     Social History   Substance and Sexual Activity  Drug Use Not Currently    Social History   Socioeconomic History   Marital status: Single    Spouse name: Not on file   Number of children: 2   Years of education: Not on file   Highest education level: Master's degree (e.g., MA, MS, MEng, MEd, MSW, MBA)  Occupational History   Not on file  Tobacco Use   Smoking status: Former    Types: Cigarettes   Smokeless tobacco: Never  Vaping Use   Vaping Use: Some days   Substances: Nicotine, Flavoring  Substance and Sexual Activity   Alcohol use: Not Currently    Comment: occasional   Drug use: Not Currently   Sexual activity: Yes    Birth control/protection: Surgical  Other Topics Concern   Not on file  Social History Narrative   ** Merged History Encounter **       Social Determinants of Health   Financial Resource Strain: Not on file  Food Insecurity: Not on file  Transportation Needs: Not on file  Physical Activity: Not on file  Stress: Not on file  Social Connections: Not on file   Additional Social History:   Sleep:  6.5  Appetite:  Good  Current Medications: Current Facility-Administered Medications  Medication Dose Route Frequency Provider Last Rate Last Admin   acetaminophen (TYLENOL) tablet 650 mg  650 mg Oral Q4H PRN Lindell Spar I, NP   650 mg at 05/29/21 0620   alum & mag hydroxide-simeth (MAALOX/MYLANTA) 200-200-20 MG/5ML suspension 30 mL  30 mL Oral Q6H PRN Sandor Arboleda, Ovid Curd, MD       atorvastatin (LIPITOR) tablet 80 mg  80 mg Oral Daily Rankin, Shuvon B, NP   80 mg at 05/30/21 0818   benztropine (COGENTIN) tablet 0.5 mg  0.5 mg Oral BID Nicholes Rough, NP       divalproex (DEPAKOTE) DR tablet 500 mg  500 mg Oral BID Lindell Spar I, NP   500 mg at 05/30/21 0820   docusate sodium (COLACE) capsule 100 mg  100 mg Oral Daily Nwoko, Herbert Pun I, NP   100 mg at  05/30/21 0819   ferrous sulfate tablet 325 mg  325 mg Oral BID WC Nwoko, Herbert Pun I, NP   325 mg at 05/30/21 0817   hydrOXYzine (ATARAX/VISTARIL) tablet 25 mg  25 mg Oral TID PRN Prescilla Sours, PA-C   25 mg at 05/27/21 2041   insulin aspart (novoLOG) injection 0-5 Units  0-5 Units Subcutaneous QHS Prescilla Sours, PA-C   3 Units at 05/16/21 2254   insulin aspart (novoLOG) injection 0-9 Units  0-9 Units Subcutaneous TID WC Rankin, Shuvon B, NP   2 Units at 05/30/21 1254   insulin glargine-yfgn (SEMGLEE) injection 16 Units  16 Units Subcutaneous Daily Franca Stakes, Ovid Curd, MD   16 Units at  05/30/21 0819   lisinopril (ZESTRIL) tablet 5 mg  5 mg Oral Daily Rankin, Shuvon B, NP   5 mg at 05/30/21 0817   LORazepam (ATIVAN) tablet 0.5 mg  0.5 mg Oral BID Ethelene Hal, NP   0.5 mg at 05/30/21 0818   LORazepam (ATIVAN) tablet 1 mg  1 mg Oral Q6H PRN Lindell Spar I, NP   1 mg at 05/24/21 1516   metFORMIN (GLUCOPHAGE) tablet 1,000 mg  1,000 mg Oral BID WC Rankin, Shuvon B, NP   1,000 mg at 05/30/21 0818   OLANZapine (ZYPREXA) injection 10 mg  10 mg Intramuscular BID PRN Briant Cedar, MD       OLANZapine (ZYPREXA) tablet 10 mg  10 mg Oral BID PRN Briant Cedar, MD   10 mg at 05/21/21 1618   propranolol (INDERAL) tablet 10 mg  10 mg Oral BID Merrick Maggio, Ovid Curd, MD   10 mg at 05/30/21 0818   QUEtiapine (SEROQUEL) tablet 50 mg  50 mg Oral QHS Ethelene Hal, NP   50 mg at 05/29/21 2128   risperiDONE (RISPERDAL M-TABS) disintegrating tablet 3 mg  3 mg Oral Daily Ethelene Hal, NP   3 mg at 05/30/21 0818   risperiDONE (RISPERDAL M-TABS) disintegrating tablet 4 mg  4 mg Oral QHS Ethelene Hal, NP   4 mg at 05/29/21 2128   traZODone (DESYREL) tablet 50 mg  50 mg Oral QHS PRN Prescilla Sours, PA-C   50 mg at 05/29/21 2128   Vitamin D3 (Vitamin D) tablet 1,000 Units  1,000 Units Oral Daily Ethelene Hal, NP   1,000 Units at 05/30/21 5681   Lab Results:  Results for  orders placed or performed during the hospital encounter of 05/16/21 (from the past 48 hour(s))  Glucose, capillary     Status: Abnormal   Collection Time: 05/28/21  4:56 PM  Result Value Ref Range   Glucose-Capillary 159 (H) 70 - 99 mg/dL    Comment: Glucose reference range applies only to samples taken after fasting for at least 8 hours.  Glucose, capillary     Status: Abnormal   Collection Time: 05/28/21  7:37 PM  Result Value Ref Range   Glucose-Capillary 181 (H) 70 - 99 mg/dL    Comment: Glucose reference range applies only to samples taken after fasting for at least 8 hours.  Glucose, capillary     Status: Abnormal   Collection Time: 05/29/21  5:44 AM  Result Value Ref Range   Glucose-Capillary 195 (H) 70 - 99 mg/dL    Comment: Glucose reference range applies only to samples taken after fasting for at least 8 hours.  Glucose, capillary     Status: Abnormal   Collection Time: 05/29/21 12:01 PM  Result Value Ref Range   Glucose-Capillary 184 (H) 70 - 99 mg/dL    Comment: Glucose reference range applies only to samples taken after fasting for at least 8 hours.  Glucose, capillary     Status: Abnormal   Collection Time: 05/29/21  5:06 PM  Result Value Ref Range   Glucose-Capillary 216 (H) 70 - 99 mg/dL    Comment: Glucose reference range applies only to samples taken after fasting for at least 8 hours.  Glucose, capillary     Status: Abnormal   Collection Time: 05/29/21  8:57 PM  Result Value Ref Range   Glucose-Capillary 184 (H) 70 - 99 mg/dL    Comment: Glucose reference range applies only to samples taken  after fasting for at least 8 hours.  Glucose, capillary     Status: Abnormal   Collection Time: 05/30/21  6:40 AM  Result Value Ref Range   Glucose-Capillary 177 (H) 70 - 99 mg/dL    Comment: Glucose reference range applies only to samples taken after fasting for at least 8 hours.  Brain natriuretic peptide     Status: None   Collection Time: 05/30/21  6:50 AM  Result  Value Ref Range   B Natriuretic Peptide 57.1 0.0 - 100.0 pg/mL    Comment: Performed at [facility], [place] [address], [place], [place] [address]  CBC with Differential/Platelet     Status: Abnormal   Collection Time: 05/30/21  6:50 AM  Result Value Ref Range   WBC 8.3 4.0 - 10.5 K/uL   RBC 4.35 3.87 - 5.11 MIL/uL   Hemoglobin 10.6 (L) 12.0 - 15.0 g/dL   HCT 34.3 (L) 36.0 - 46.0 %   MCV 78.9 (L) 80.0 - 100.0 fL   MCH 24.4 (L) 26.0 - 34.0 pg   MCHC 30.9 30.0 - 36.0 g/dL   RDW 17.9 (H) 11.5 - 15.5 %   Platelets 310 150 - 400 K/uL   nRBC 0.0 0.0 - 0.2 %   Neutrophils Relative % 53 %   Neutro Abs 4.4 1.7 - 7.7 K/uL   Lymphocytes Relative 35 %   Lymphs Abs 2.9 0.7 - 4.0 K/uL   Monocytes Relative 9 %   Monocytes Absolute 0.7 0.1 - 1.0 K/uL   Eosinophils Relative 2 %   Eosinophils Absolute 0.2 0.0 - 0.5 K/uL   Basophils Relative 1 %   Basophils Absolute 0.1 0.0 - 0.1 K/uL   Immature Granulocytes 0 %   Abs Immature Granulocytes 0.03 0.00 - 0.07 K/uL    Comment: Performed at [facility], [place] [address] [place], [place] [address]  Comprehensive metabolic panel     Status: Abnormal   Collection Time: 05/30/21  6:50 AM  Result Value Ref Range   Sodium 136 135 - 145 mmol/L   Potassium 4.1 3.5 - 5.1 mmol/L   Chloride 103 98 - 111 mmol/L   CO2 24 22 - 32 mmol/L   Glucose, Bld 193 (H) 70 - 99 mg/dL    Comment: Glucose reference range applies only to samples taken after fasting for at least 8 hours.   BUN 10 6 - 20 mg/dL   Creatinine, Ser 0.63 0.44 - 1.00 mg/dL   Calcium 8.8 (L) 8.9 - 10.3 mg/dL   Total Protein 6.0 (L) 6.5 - 8.1 g/dL   Albumin 3.2 (L) 3.5 - 5.0 g/dL   AST 15 15 - 41 U/L   ALT 18 0 - 44 U/L   Alkaline Phosphatase 58 38 - 126 U/L   Total Bilirubin 0.6 0.3 - 1.2 mg/dL   GFR, Estimated >60 >60 mL/min    Comment: (NOTE) Calculated using the CKD-EPI Creatinine Equation (2021)    Anion gap 9 5 - 15    Comment: Performed at [facility], [place] [address], [place], [place] [address]  Valproic acid level     Status: None   Collection Time: 05/30/21  6:50 AM  Result Value Ref Range   Valproic Acid Lvl 62 50.0 - 100.0 ug/mL    Comment: Performed at [facility], [place] [address], [place], [place] [address]  Glucose, capillary     Status: Abnormal   Collection Time: 05/30/21 11:55 AM  Result Value Ref Range   Glucose-Capillary 189 (H) 70 - 99 mg/dL    Comment: Glucose reference range applies only to samples taken after fasting for at least 8 hours.   Blood Alcohol level:  Lab Results  Component Value Date   ETH <10 05/12/2021   ETH <10 62/83/6629   Metabolic Disorder Labs: Lab Results  Component Value Date   HGBA1C 9.2 (H) 05/12/2021   MPG 217.34 05/12/2021   MPG 209 08/19/2020   Lab Results  Component Value Date   PROLACTIN 11.7 05/17/2018   PROLACTIN 105.5 (H) 02/28/2017   Lab Results  Component Value Date   CHOL 101 05/17/2021   TRIG 65 05/17/2021   HDL 32 (L) 05/17/2021   CHOLHDL 3.2 05/17/2021   VLDL 13 05/17/2021   LDLCALC 56 05/17/2021   LDLCALC 49 08/18/2020   Physical Findings: AIMS: Facial and Oral Movements Muscles of Facial Expression: None, normal Lips and Perioral Area: None, normal Jaw: None, normal Tongue: None, normal,Extremity Movements Upper (arms, wrists, hands, fingers): None, normal Lower (legs, knees, ankles, toes): None, normal,  Overall Severity Severity of abnormal movements (highest score from questions above): None, normal Incapacitation due to abnormal movements: None, normal Patient's awareness of abnormal movements (rate only patient's report): No Awareness,  Dental Status Current problems with teeth and/or dentures?: No Does patient usually wear dentures?: No  CIWA:  N/A COWS:  N/A   Musculoskeletal: Strength & Muscle Tone: within normal limits Gait & Station: normal Patient leans: N/A  Psychiatric Specialty  Exam:  Presentation  General Appearance: Appropriate for Environment; Fairly Groomed  Eye Contact:Fair  Speech:Normal  Speech Volume: Normal  Handedness:Right  Mood and Affect  Mood:Euthymic  Affect: Improving.  Thought Process  Thought Processes:Coherent,   Descriptions of Associations:Intact  Orientation:Full (Time, Place and Person)  Thought Content:Illogical  History of Schizophrenia/Schizoaffective disorder:Yes  Duration of Psychotic Symptoms:Greater than six months  Hallucinations:No patient denies  Ideas of Reference:Paranoia; Delusions  Suicidal Thoughts:No, patient denies  Homicidal Thoughts: No, patient denies  Sensorium  Memory:Immediate Fair  Judgment:Poor  Insight:Poor  Executive Functions  Concentration:Fair  Attention Span:Fair  [place]  Language:Good  Psychomotor Activity  Psychomotor Activity: Slowed  Assets  Assets:Financial Resources/Insurance  Sleep  Sleep: Improving: 5.75 hrs.  Physical Exam: Physical Exam Vitals and nursing note reviewed.  HENT:     Mouth/Throat:     Pharynx: Oropharynx is clear.  Eyes:     Pupils: Pupils are equal, round, and reactive to light.  Cardiovascular:     Pulses: Normal pulses.  Pulmonary:     Effort: Pulmonary effort is normal.  Abdominal:     General: There is no distension.     Tenderness: There is no abdominal tenderness.  Genitourinary:    Comments: Deferred Musculoskeletal:        General: No swelling or deformity. Normal range of motion.     Cervical back: Normal range of motion.  Skin:    General: Skin is warm and dry.  Neurological:     Mental Status: She is alert and oriented to person, place, and time.     Sensory: No sensory deficit.     Motor: No weakness.     Coordination: Coordination normal.     Gait: Gait normal.   Review of Systems  Constitutional:  Negative for chills, diaphoresis and fever.  HENT:  Negative for congestion  and sore throat.   Eyes:  Negative for blurred vision.  Respiratory:  Negative for  cough, shortness of breath and wheezing.   Cardiovascular:  Negative for chest pain and palpitations.  Gastrointestinal:  Negative for abdominal pain, constipation, diarrhea, heartburn, nausea and vomiting.  Genitourinary:  Negative for dysuria.  Musculoskeletal:  Negative for back pain, falls, joint pain, myalgias and neck pain.  Skin: Negative.   Neurological:  Negative for dizziness, tingling, tremors, sensory change, speech change, focal weakness, seizures, loss of consciousness, weakness and headaches.  Psychiatric/Behavioral:  Negative for hallucinations (denies hallucinations), memory loss and suicidal ideas. Substance abuse: Hx. THC use disorder.The patient is not nervous/anxious and does not have insomnia.    Blood pressure (!) 133/91, pulse 98, temperature (!) 97.4 F (36.3 C), temperature source Oral, resp. rate 20, height 5\' 11"  (1.803 m), weight 114.8 kg, SpO2 100 %. Body mass index is 35.29 kg/m.  Treatment Plan Summary: Daily contact with patient to assess and evaluate symptoms and progress in treatment and Medication management.   Continue inpatient hospitalization.  Will continue today 05/30/2021 plan as below except where it is noted.   Diagnoses:  Schizoaffective disorder, bipolar-type.  Cannabis use disorder.  Other medical issues:  Diabetes Mellitus.  HTN (essential).  Plan.  Schizoaffective disorder:  -Continue Risperdal-ODT 3 mg po daily in the morning -Continue Risperdal-ODT 4 mg po nightly at bedtime -Continue Seroquel 50 mg po Q bedtime. -Continue Depakote 500 mg po bid.  EPS prevention. -Continue Cogentin to 0.5 mg po bid.  Anxiety.  -Continue Ativan 0.5 mg PO BID -Continue Vistaril 25 mg po tid prn. -Continue Propranolol 10 mg po bid for anxiety/elevated heart rate.  Insomnia.  -Continue Trazodone 50 mg po Q hs prn.  UTI:  Bactrim Ds 800-160 mg po bid x 7 days  :(completed).  Vitamin D deficiency:  Continue Vitamin D 1,000 units daily  Agitation/psychosis.  Continue Olanzapine 10 mg po or IM bid prn. Continue lorazepam 1 mg po Q 6 hrs prn for anxiety.  Other medical issues, continue:  Lipitor 80 mg po daily for high cholesterol.  Insulin glargine-yfgn 16 units subQ daily for DM. Metformin 1,000 mg po bid for diabetes mellitus. Lisinopril 5 mg po daily for HTN. Continue Ferrous sulfate 325 mg po bid for anemia. Continue the sliding scale insulin as recommended per CBG results. Continue Colace 100 mg daily for constipation prophylaxis  Labs ordered for AM draw tomorrow: CMP, CBC with Diff, BNP and Valproic Acid level New Labs reviewed: BNP WNL, Valproic acid level WNL at 62. BP slightly elevated today as above, but will recheck.  Discharge planning in progress.  Nicholes Rough, NP  05/30/2021, 3:10 PM  Total Time Spent in Direct Patient Care:  I personally spent 30 minutes on the unit in direct patient care. The direct patient care time included face-to-face time with the patient, reviewing the patient's chart, communicating with other professionals, and coordinating care. Greater than 50% of this time was spent in counseling or coordinating care with the patient regarding goals of hospitalization, psycho-education, and discharge planning needs.  I have independently evaluated the patient during a face-to-face assessment on 05/30/21. I reviewed the patient's chart, and I participated in key portions of the service. I discussed the case with the [employer], and I agree with the assessment and plan of care as documented in the [employer] note, as addended by me or notated below:  I agree with the note and plan. Patient is doing better. Less paranoid. Discharge planning for Wednesday.   Janine Limbo, MD Psychiatrist    Patient  ID: [name], female   DOB: [birth year], 45 y.o.   MRN: [id number]

## 2021-05-30 NOTE — Group Note (Signed)
LCSW Group Therapy Note  Group Date: 05/30/2021 Start Time: 1300 End Time: 1400   Type of Therapy and Topic:  Group Therapy: Anger Cues and Responses  Participation Level:  Active   Description of Group:   In this group, patients learned how to recognize the physical, cognitive, emotional, and behavioral responses they have to anger-provoking situations.  They identified a recent time they became angry and how they reacted.  They analyzed how their reaction was possibly beneficial and how it was possibly unhelpful.  The group discussed a variety of healthier coping skills that could help with such a situation in the future.  Focus was placed on how helpful it is to recognize the underlying emotions to our anger, because working on those can lead to a more permanent solution as well as our ability to focus on the important rather than the urgent.  Therapeutic Goals: Patients will remember their last incident of anger and how they felt emotionally and physically, what their thoughts were at the time, and how they behaved. Patients will identify how their behavior at that time worked for them, as well as how it worked against them. Patients will explore possible new behaviors to use in future anger situations. Patients will learn that anger itself is normal and cannot be eliminated, and that healthier reactions can assist with resolving conflict rather than worsening situations.  Summary of Patient Progress:  Alesia was active during the group. She shared a recent occurrence wherein feeling insecure led to anger. She demonstrated good insight into the subject matter, was respectful of peers, and participated throughout the entire session. Patient discussed different people she could communicate with in her life to discuss deeper emotions that are going on rather than acting out with anger.   Therapeutic Modalities:   Cognitive Behavioral Therapy    Zachery Conch, LCSWA 05/30/2021  1:42 PM

## 2021-05-30 NOTE — Group Note (Signed)
Recreation Therapy Group Note   Group Topic:Health and Wellness  Group Date: 05/30/2021 Start Time: 1000 End Time: 1100 Facilitators: Victorino Sparrow, LRT,CTRS Location: 500 Hall Dayroom   Goal Area(s) Addresses:  Patient will define components of whole wellness. Patient will verbalize benefit of whole wellness.  Group Description:  LRT and patients discussed the components of wellness (mental, physical and spiritual) and why they are important.  LRT and patients completed interventions that corresponded to each of the components.  In the first section, patients took turns leading the group in exercises of their choosing to show how the physical in important.  Second, patients did meditation to show how it helps to relax and clear the mind and lastly, patients were able to request songs to be played as a form of therapy that can bring all three elements together at once.   Affect/Mood: Happy   Participation Level: Active   Participation Quality: Independent   Behavior: Appropriate   Speech/Thought Process: Focused   Insight: Good   Judgement: Good   Modes of Intervention: Activity and Music   Patient Response to Interventions:  Attentive and Engaged   Education Outcome:  Acknowledges education and In group clarification offered    Clinical Observations/Individualized Feedback: Pt arrived halfway through the second exercise which was meditation.  Pt came after meeting with doctor.  Pt joined right in with the activity.  Pt was also attentive to the music peers chose during group.  Pt also picked two songs which pt explained where songs that talk about love and the melody from each helps to calm and relax you.     Plan: Continue to engage patient in RT group sessions 2-3x/week.   Victorino Sparrow, LRT,CTRS 05/30/2021 1:49 PM

## 2021-05-30 NOTE — Progress Notes (Signed)
Adult Psychoeducational Group Note  Date:  05/30/2021 Time:  8:52 PM  Group Topic/Focus:  Wrap-Up Group:   The focus of this group is to help patients review their daily goal of treatment and discuss progress on daily workbooks.  Participation Level:  Active  Participation Quality:  Appropriate  Affect:  Appropriate  Cognitive:  Appropriate  Insight: Appropriate  Engagement in Group:  Engaged  Modes of Intervention:  Discussion  Additional Comments:   Pt stated her goal for today was to focus on her treatment plan and to speak with her doctor about her discharge plan. Pt stated she accomplished her goals today. Pt stated she talked with her doctor and her social worker about her care today. Pt rated her overall day a 10. Pt stated the plan is for her to discharge on 06/01/21. Pt stated she was able to contact her cousin, mother, friend, and her sister today which improved her overall day. Pt stated she felt better about herself tonight.  Pt stated she was able to attend all meals. Pt stated she took all medications provided today. Pt stated her appetite was pretty good today. Pt rated her sleep last night was pretty good. Pt stated the goal tonight was to get some rest. Pt stated she had some physical pain tonight. Pt stated she had some mild pain in her left ankle tonight. Pt rated the mild pain in her left ankle a 3 on the pain level scale. Pt deny visual hallucinations and auditory issues tonight. Pt denies thoughts of harming herself or others. Pt stated she would alert staff if anything changed.   Candy Sledge 05/30/2021, 8:52 PM

## 2021-05-30 NOTE — Progress Notes (Signed)
   05/30/21 1959  Psych Admission Type (Psych Patients Only)  Admission Status Involuntary  Psychosocial Assessment  Patient Complaints None  Eye Contact Fair  Facial Expression Anxious;Pained  Affect Anxious  Speech Slow;Logical/coherent  Interaction Cautious;Guarded  Motor Activity Slow  Appearance/Hygiene Unremarkable  Behavior Characteristics Cooperative  Mood Depressed;Pleasant  Thought Process  Coherency WDL  Content WDL  Delusions None reported or observed  Perception WDL  Hallucination None reported or observed  Judgment WDL  Confusion WDL  Danger to Self  Current suicidal ideation? Denies  Danger to Others  Danger to Others None reported or observed

## 2021-05-31 LAB — GLUCOSE, CAPILLARY
Glucose-Capillary: 162 mg/dL — ABNORMAL HIGH (ref 70–99)
Glucose-Capillary: 196 mg/dL — ABNORMAL HIGH (ref 70–99)
Glucose-Capillary: 150 mg/dL — ABNORMAL HIGH (ref 70–99)
Glucose-Capillary: 213 mg/dL — ABNORMAL HIGH (ref 70–99)

## 2021-05-31 NOTE — Group Note (Deleted)
Recreation Therapy Group Note   Group Topic:Leisure Education  Group Date: 05/31/2021 Start Time: 1000 End Time: 1100 Facilitators: Vikki Ports, NT Location: [address] Day[address]       Affect/Mood: {RT BHH Affect/Mood:26271}   Participation Level: {RT BHH Participation [employer]   Participation Quality: {RT BHH Participation Quality:26268}   Behavior: {RT BHH Group Behavior:26269}   Speech/Thought Process: {RT BHH Speech/Thought:26276}   Insight: {RT BHH Insight:26272}   Judgement: {RT BHH Judgement:26278}   Modes of Intervention: {RT BHH Modes of Intervention:26277}   Patient Response to Interventions:  {RT BHH Patient Response to Intervention:26274}   Education Outcome:  {RT [place] Education Outcome:26279}   Clinical Observations/Individualized Feedback: *** was *** in their participation of session activities and group discussion. Pt identified ***   Plan: {RT BHH Tx DUKG:25427}   Vikki Ports, NT,  05/31/2021 11:55 AM

## 2021-05-31 NOTE — Progress Notes (Signed)
CSW met with patient regarding discharge.  Patient reports that she is excited about discharge.  CSW spoke with patient support, Varney Biles, who reports that she plans to pick patient up at noon.  Patient support agreed that they would call this social worker if any documentation needs to be sent to CST team upon discharge.   Patient discussed needing paper scripts for prescriptions so that she can pick up at pharmacy of choice in [place].    Deaveon Schoen, LCSW, [place] Social Worker  [facility]

## 2021-05-31 NOTE — Progress Notes (Signed)
Adult Psychoeducational Group Note  Date:  05/31/2021 Time:  9:04 PM  Group Topic/Focus:  Wrap-Up Group:   The focus of this group is to help patients review their daily goal of treatment and discuss progress on daily workbooks.  Participation Level:  Active  Participation Quality:  Appropriate  Affect:  Appropriate  Cognitive:  Appropriate  Insight: Appropriate  Engagement in Group:  Engaged  Modes of Intervention:  Discussion  Additional Comments:  Pt stated her goal for today was to focus on her treatment plan and to speak with her doctor about her discharge plan. Pt stated she accomplished her goals today. Pt stated she talked with her doctor and her social worker about her care today. Pt rated her overall day a 10. Pt stated the plan is for her to discharge on 06/01/21. Pt stated she was able to contact her cousin, mother and her sister today which improved her overall day. Pt stated she felt better about herself tonight.  Pt stated she was able to attend all meals. Pt stated she took all medications provided today. Pt stated her appetite was pretty good today. Pt rated her sleep last night was pretty good. Pt stated the goal tonight was to get some rest. Pt stated she had no physical pain tonight. Pt deny visual hallucinations and auditory issues tonight. Pt denies thoughts of harming herself or others. Pt stated she would alert staff if anything changed.   Candy Sledge 05/31/2021, 9:04 PM

## 2021-05-31 NOTE — Group Note (Signed)
Recreation Therapy Group Note   Group Topic:Leisure Education  Group Date: 05/31/2021 Start Time: 1000 End Time: 1100 Facilitators: Victorino Sparrow, LRT,CTRS Location: [address] Dayroom   Goal Area(s) Addresses:  Patient will successfully identify positive leisure and recreation activities.  Patient will acknowledge benefits of participation in healthy leisure activities post discharge.  Patient will actively work with peers toward a shared goal.   Group Description:  Pictionary. Patients took turns trying to guess the picture being drawn on the board by their peer. If they guessed the correct answer, they would get the next turn.  If they guessed wrong, then then LRT would pick the next person to go.  Post-activity discussion reviewed benefits of positive recreation outlets: reducing stress, improving coping mechanisms, increasing self-esteem, and building larger support systems.   Affect/Mood: Appropriate and Happy   Participation Level: Engaged   Participation Quality: Independent   Behavior: Appropriate   Speech/Thought Process: Focused   Insight: Good   Judgement: Good   Modes of Intervention: Competitive Play   Patient Response to Interventions:  Engaged   Education Outcome:  Acknowledges education and In group clarification offered    Clinical Observations/Individualized Feedback: Pt was bright and into the activity.  Pt would show some doubt in ability to compete in the activity.  However, pt was much more successful guessing the pictures and showed good ability to draw the pictures as well.  Pt interacted well with peers and would encourage them as well.    Plan: Continue to engage patient in RT group sessions 2-3x/week.   Victorino Sparrow, LRT,CTRS 05/31/2021 11:46 AM

## 2021-05-31 NOTE — Progress Notes (Signed)
Pt was worrying about the new pt on the unit, pt continued to express her displeasure for the other patients disrespectful and disruptive behavior.     05/31/21 2200  Psych Admission Type (Psych Patients Only)  Admission Status Involuntary  Psychosocial Assessment  Patient Complaints Worrying  Eye Contact Fair  Facial Expression Anxious;Pained  Affect Anxious  Speech Slow;Logical/coherent  Interaction Cautious;Guarded  Motor Activity Slow  Appearance/Hygiene Unremarkable  Behavior Characteristics Cooperative  Mood Pleasant  Thought Process  Coherency WDL  Content WDL  Delusions None reported or observed  Perception WDL  Hallucination None reported or observed  Judgment WDL  Confusion WDL  Danger to Self  Current suicidal ideation? Denies  Danger to Others  Danger to Others None reported or observed

## 2021-05-31 NOTE — Progress Notes (Signed)
[facility] MD Progress Note  05/31/2021 11:37 AM [name]  MRN:  [id number]  Reason for admission: 45 year old female with a reported psychiatric history of "schizoaffective, bipolar, and personality" who was admitted to this psychiatric unit for disorientation, confusion, hallucinations, suicidal thoughts, and delusions. During the her admission interview, about details of her HPI.  She reports that she was driving from [place], to [place], realized that she was not thinking clearly and confused, and went to the [facility] for evaluation.    Recommendations made by the treatment team yesterday:  Continue Risperdal-ODT 3 mg po daily in the morning -Continue Risperdal-ODT 4 mg po nightly at bedtime -Continue Seroquel 50 mg po Q bedtime. -Continue Depakote 500 mg po bid. -Continue Cogentin to 0.5 mg po bid. -Continue Ativan 0.5 mg PO BID -Continue Vistaril 25 mg po tid prn. -Continue Propranolol 10 mg po bid for anxiety/elevated heart rate. -Continue Trazodone 50 mg po Q hs prn. -Continue Vitamin D 1,000 units daily -Continue Olanzapine 10 mg po or IM bid prn. -Continue lorazepam 1 mg po Q 6 hrs prn for anxiety.  Evaluation on the unit today:    For this encounter, pt is seen in her room on the 500 hall, chart reviewed and case discussed with the treatment team. Patient with good eye contact, is calm and cooperative, attention to personal hygiene is good, and she is dressed in regular clothing. She reports that she is doing well today, concentration is good, speech is clear coherent today than reported yesterday. Pt denies SI/HI/AVH, is able to carry on a logical conversation and displays her emotions appropriately. There is no paranoia today, and also no evidence of any delusional thoughts.   Pt reports a good appetite, reports that she is going to the cafeteria and is eating all of her meals for breakfast, lunch, dinner and snacks in between. Pt reports a good sleep quality and  states that she slept at least  7 hours last night. As per flow sheets, she slept 7 hrs last night. Attention and concentration today is good, pt denies any numbness or tingling sensations, or any medication related side effects. Mood is euthymic, affect is congruent, pt reports that her last bowel movement was last night, and verbalizes being ready for discharge tomorrow. Pt states that her cousin or uncle will be picking her up at time of discharge. Pt is ambulating with a steady gait, and is visible in the milieu attending group sessions and interacting appropriately with her peers and staff. Pt educated to seek out staff attention as needed, and verbalizes understanding. Pt is tolerating her medications well, and is improving, and no med changes are necessary at this time.  Principal Problem: Schizoaffective disorder, bipolar type ([place])  Diagnosis: Principal Problem:   Schizoaffective disorder, bipolar type ([place]) Active Problems:   Cannabis use disorder, moderate, dependence ([place])  Total Time spent with patient:  25 minutes  Past Psychiatric History: Schizoaffective disorder, bipolar-type.  Past Medical History:  Past Medical History:  Diagnosis Date   Abnormal Pap smear    Bipolar 1 disorder ([place])    Diabetes mellitus without complication ([place])    Fibroids    Hypertension    IBS (irritable bowel syndrome)     Past Surgical History:  Procedure Laterality Date   CERVICAL BIOPSY     CHOLECYSTECTOMY     ESSURE TUBAL LIGATION     HIATAL HERNIA REPAIR     TUBAL LIGATION     Family History:  Family History  Problem Relation Age of Onset   Diabetes Father    Diabetes Paternal Grandmother    Diabetes Maternal Grandmother    Mental illness Cousin    Healthy Mother    Family Psychiatric  History: See H&P.  Social History:  Social History   Substance and Sexual Activity  Alcohol Use Not Currently   Comment: occasional     Social History   Substance and Sexual Activity   Drug Use Not Currently    Social History   Socioeconomic History   Marital status: Single    Spouse name: Not on file   Number of children: 2   Years of education: Not on file   Highest education level: Master's degree (e.g., MA, MS, MEng, MEd, MSW, MBA)  Occupational History   Not on file  Tobacco Use   Smoking status: Former    Types: Cigarettes   Smokeless tobacco: Never  Vaping Use   Vaping Use: Some days   Substances: Nicotine, Flavoring  Substance and Sexual Activity   Alcohol use: Not Currently    Comment: occasional   Drug use: Not Currently   Sexual activity: Yes    Birth control/protection: Surgical  Other Topics Concern   Not on file  Social History Narrative   ** Merged History Encounter **       Social Determinants of Health   Financial Resource Strain: Not on file  Food Insecurity: Not on file  Transportation Needs: Not on file  Physical Activity: Not on file  Stress: Not on file  Social Connections: Not on file   Additional Social History:   Sleep:  6.5  Appetite:  Good  Current Medications: Current Facility-Administered Medications  Medication Dose Route Frequency Provider Last Rate Last Admin   acetaminophen (TYLENOL) tablet 650 mg  650 mg Oral Q4H PRN Lindell Spar I, NP   650 mg at 05/29/21 0620   alum & mag hydroxide-simeth (MAALOX/MYLANTA) 200-200-20 MG/5ML suspension 30 mL  30 mL Oral Q6H PRN Massengill, Ovid Curd, MD       atorvastatin (LIPITOR) tablet 80 mg  80 mg Oral Daily Rankin, Shuvon B, NP   80 mg at 05/31/21 0842   benztropine (COGENTIN) tablet 0.5 mg  0.5 mg Oral BID Nicholes Rough, NP   0.5 mg at 05/31/21 0835   divalproex (DEPAKOTE) DR tablet 500 mg  500 mg Oral BID Lindell Spar I, NP   500 mg at 05/31/21 0840   docusate sodium (COLACE) capsule 100 mg  100 mg Oral Daily Lindell Spar I, NP   100 mg at 05/31/21 0836   ferrous sulfate tablet 325 mg  325 mg Oral BID WC Nwoko, Herbert Pun I, NP   325 mg at 05/31/21 0835   hydrOXYzine  (ATARAX/VISTARIL) tablet 25 mg  25 mg Oral TID PRN Prescilla Sours, PA-C   25 mg at 05/27/21 2041   insulin aspart (novoLOG) injection 0-5 Units  0-5 Units Subcutaneous QHS Prescilla Sours, PA-C   3 Units at 05/16/21 2254   insulin aspart (novoLOG) injection 0-9 Units  0-9 Units Subcutaneous TID WC Rankin, Shuvon B, NP   2 Units at 05/31/21 1194   insulin glargine-yfgn (SEMGLEE) injection 16 Units  16 Units Subcutaneous Daily Massengill, Ovid Curd, MD   16 Units at 05/31/21 0836   lisinopril (ZESTRIL) tablet 5 mg  5 mg Oral Daily Rankin, Shuvon B, NP   5 mg at 05/31/21 0836   LORazepam (ATIVAN) tablet 0.5 mg  0.5 mg Oral  BID Ethelene Hal, NP   0.5 mg at 05/31/21 0835   LORazepam (ATIVAN) tablet 1 mg  1 mg Oral Q6H PRN Lindell Spar I, NP   1 mg at 05/24/21 1516   metFORMIN (GLUCOPHAGE) tablet 1,000 mg  1,000 mg Oral BID WC Rankin, Shuvon B, NP   1,000 mg at 05/31/21 0836   OLANZapine (ZYPREXA) injection 10 mg  10 mg Intramuscular BID PRN Briant Cedar, MD       OLANZapine (ZYPREXA) tablet 10 mg  10 mg Oral BID PRN Briant Cedar, MD   10 mg at 05/21/21 1618   propranolol (INDERAL) tablet 10 mg  10 mg Oral BID Janine Limbo, MD   10 mg at 05/31/21 0834   QUEtiapine (SEROQUEL) tablet 50 mg  50 mg Oral QHS Ethelene Hal, NP   50 mg at 05/30/21 2054   risperiDONE (RISPERDAL M-TABS) disintegrating tablet 3 mg  3 mg Oral Daily Ethelene Hal, NP   3 mg at 05/31/21 0836   risperiDONE (RISPERDAL M-TABS) disintegrating tablet 4 mg  4 mg Oral QHS Ethelene Hal, NP   4 mg at 05/30/21 2054   traZODone (DESYREL) tablet 50 mg  50 mg Oral QHS PRN Prescilla Sours, PA-C   50 mg at 05/30/21 2054   Vitamin D3 (Vitamin D) tablet 1,000 Units  1,000 Units Oral Daily Ethelene Hal, NP   1,000 Units at 05/31/21 8850   Lab Results:  Results for orders placed or performed during the hospital encounter of 05/16/21 (from the past 48 hour(s))  Glucose, capillary     Status:  Abnormal   Collection Time: 05/29/21 12:01 PM  Result Value Ref Range   Glucose-Capillary 184 (H) 70 - 99 mg/dL    Comment: Glucose reference range applies only to samples taken after fasting for at least 8 hours.  Glucose, capillary     Status: Abnormal   Collection Time: 05/29/21  5:06 PM  Result Value Ref Range   Glucose-Capillary 216 (H) 70 - 99 mg/dL    Comment: Glucose reference range applies only to samples taken after fasting for at least 8 hours.  Glucose, capillary     Status: Abnormal   Collection Time: 05/29/21  8:57 PM  Result Value Ref Range   Glucose-Capillary 184 (H) 70 - 99 mg/dL    Comment: Glucose reference range applies only to samples taken after fasting for at least 8 hours.  Glucose, capillary     Status: Abnormal   Collection Time: 05/30/21  6:40 AM  Result Value Ref Range   Glucose-Capillary 177 (H) 70 - 99 mg/dL    Comment: Glucose reference range applies only to samples taken after fasting for at least 8 hours.  Brain natriuretic peptide     Status: None   Collection Time: 05/30/21  6:50 AM  Result Value Ref Range   B Natriuretic Peptide 57.1 0.0 - 100.0 pg/mL    Comment: Performed at [facility], [place] [address] [place], [place] [address]  CBC with Differential/Platelet     Status: Abnormal   Collection Time: 05/30/21  6:50 AM  Result Value Ref Range   WBC 8.3 4.0 - 10.5 K/uL   RBC 4.35 3.87 - 5.11 MIL/uL   Hemoglobin 10.6 (L) 12.0 - 15.0 g/dL   HCT 34.3 (L) 36.0 - 46.0 %   MCV 78.9 (L) 80.0 - 100.0 fL   MCH 24.4 (L) 26.0 - 34.0 pg   MCHC 30.9  30.0 - 36.0 g/dL   RDW 17.9 (H) 11.5 - 15.5 %   Platelets 310 150 - 400 K/uL   nRBC 0.0 0.0 - 0.2 %   Neutrophils Relative % 53 %   Neutro Abs 4.4 1.7 - 7.7 K/uL   Lymphocytes Relative 35 %   Lymphs Abs 2.9 0.7 - 4.0 K/uL   Monocytes Relative 9 %   Monocytes Absolute 0.7 0.1 - 1.0 K/uL   Eosinophils Relative 2 %   Eosinophils Absolute 0.2 0.0 - 0.5 K/uL   Basophils Relative 1 %    Basophils Absolute 0.1 0.0 - 0.1 K/uL   Immature Granulocytes 0 %   Abs Immature Granulocytes 0.03 0.00 - 0.07 K/uL    Comment: Performed at [facility], [place] [address] [place], [place] [address]  Comprehensive metabolic panel     Status: Abnormal   Collection Time: 05/30/21  6:50 AM  Result Value Ref Range   Sodium 136 135 - 145 mmol/L   Potassium 4.1 3.5 - 5.1 mmol/L   Chloride 103 98 - 111 mmol/L   CO2 24 22 - 32 mmol/L   Glucose, Bld 193 (H) 70 - 99 mg/dL    Comment: Glucose reference range applies only to samples taken after fasting for at least 8 hours.   BUN 10 6 - 20 mg/dL   Creatinine, Ser 0.63 0.44 - 1.00 mg/dL   Calcium 8.8 (L) 8.9 - 10.3 mg/dL   Total Protein 6.0 (L) 6.5 - 8.1 g/dL   Albumin 3.2 (L) 3.5 - 5.0 g/dL   AST 15 15 - 41 U/L   ALT 18 0 - 44 U/L   Alkaline Phosphatase 58 38 - 126 U/L   Total Bilirubin 0.6 0.3 - 1.2 mg/dL   GFR, Estimated >60 >60 mL/min    Comment: (NOTE) Calculated using the CKD-EPI Creatinine Equation (2021)    Anion gap 9 5 - 15    Comment: Performed at [facility], [place] [address] [place], [place] [address]  Valproic acid level     Status: None   Collection Time: 05/30/21  6:50 AM  Result Value Ref Range   Valproic Acid Lvl 62 50.0 - 100.0 ug/mL    Comment: Performed at [facility], [place] [address], [place], [place] [address]  Glucose, capillary     Status: Abnormal   Collection Time: 05/30/21 11:55 AM  Result Value Ref Range   Glucose-Capillary 189 (H) 70 - 99 mg/dL    Comment: Glucose reference range applies only to samples taken after fasting for at least 8 hours.  Glucose, capillary     Status: Abnormal   Collection Time: 05/30/21  5:03 PM  Result Value Ref Range   Glucose-Capillary 200 (H) 70 - 99 mg/dL    Comment: Glucose reference range applies only to samples taken after fasting for at least 8 hours.  Glucose, capillary     Status: Abnormal   Collection Time:  05/30/21  7:26 PM  Result Value Ref Range   Glucose-Capillary 198 (H) 70 - 99 mg/dL    Comment: Glucose reference range applies only to samples taken after fasting for at least 8 hours.  Glucose, capillary     Status: Abnormal   Collection Time: 05/31/21  5:39 AM  Result Value Ref Range   Glucose-Capillary 162 (H) 70 - 99 mg/dL    Comment: Glucose reference range applies only to samples taken after fasting for at least 8 hours.   Blood  Alcohol level:  Lab Results  Component Value Date   ETH <10 05/12/2021   ETH <10 24/03/7352   Metabolic Disorder Labs: Lab Results  Component Value Date   HGBA1C 9.2 (H) 05/12/2021   MPG 217.34 05/12/2021   MPG 209 08/19/2020   Lab Results  Component Value Date   PROLACTIN 11.7 05/17/2018   PROLACTIN 105.5 (H) 02/28/2017   Lab Results  Component Value Date   CHOL 101 05/17/2021   TRIG 65 05/17/2021   HDL 32 (L) 05/17/2021   CHOLHDL 3.2 05/17/2021   VLDL 13 05/17/2021   LDLCALC 56 05/17/2021   LDLCALC 49 08/18/2020   Physical Findings: AIMS: Facial and Oral Movements Muscles of Facial Expression: None, normal Lips and Perioral Area: None, normal Jaw: None, normal Tongue: None, normal,Extremity Movements Upper (arms, wrists, hands, fingers): None, normal Lower (legs, knees, ankles, toes): None, normal,  Overall Severity Severity of abnormal movements (highest score from questions above): None, normal Incapacitation due to abnormal movements: None, normal Patient's awareness of abnormal movements (rate only patient's report): No Awareness,  Dental Status Current problems with teeth and/or dentures?: No Does patient usually wear dentures?: No  CIWA:  N/A COWS:  N/A  AIMS-0 Musculoskeletal: Strength & Muscle Tone: within normal limits Gait & Station: normal Patient leans: N/A  Psychiatric Specialty Exam:  Presentation  General Appearance: Appropriate for Environment; Well Groomed  Eye Contact:Good  Speech:Normal  Speech  Volume: Normal  Handedness:Right  Mood and Affect  Mood:Euthymic  Affect: Improving.  Thought Process  Thought Processes:Coherent,   Descriptions of Associations:Intact  Orientation:Full (Time, Place and Person)  Thought Content:Logical  History of Schizophrenia/Schizoaffective disorder:Yes  Duration of Psychotic Symptoms:Greater than six months  Hallucinations:No patient denies  Ideas of Reference:None  Suicidal Thoughts:No, patient denies  Homicidal Thoughts: No, patient denies  Sensorium  Memory:Immediate Good  Judgment:Fair  Insight:Poor; [place]  Concentration:Good  Attention Span:Good  [place] of Knowledge:Good  Language:Good  Psychomotor Activity  Psychomotor Activity: Slowed  Assets  Assets:Financial Resources/Insurance; Social Support  Sleep  Sleep: Improving: 5.75 hrs.  Physical Exam: Physical Exam Vitals and nursing note reviewed.  HENT:     Mouth/Throat:     Pharynx: Oropharynx is clear.  Eyes:     Pupils: Pupils are equal, round, and reactive to light.  Cardiovascular:     Pulses: Normal pulses.  Pulmonary:     Effort: Pulmonary effort is normal.  Abdominal:     General: There is no distension.     Tenderness: There is no abdominal tenderness.  Genitourinary:    Comments: Deferred Musculoskeletal:        General: No swelling or deformity. Normal range of motion.     Cervical back: Normal range of motion.  Skin:    General: Skin is warm and dry.     Findings: No rash.  Neurological:     Mental Status: She is alert and oriented to person, place, and time.     Sensory: No sensory deficit.     Motor: No weakness.     Coordination: Coordination normal.     Gait: Gait normal.   Review of Systems  Constitutional:  Negative for chills, diaphoresis and fever.  HENT:  Negative for congestion, hearing loss and sore throat.   Eyes:  Negative for blurred vision, discharge and redness.  Respiratory:   Negative for cough, shortness of breath and wheezing.   Cardiovascular:  Negative for chest pain and palpitations.  Gastrointestinal:  Negative for  abdominal pain, constipation, diarrhea, heartburn, nausea and vomiting.  Genitourinary:  Negative for dysuria.  Musculoskeletal:  Negative for back pain, falls, joint pain, myalgias and neck pain.  Skin: Negative.  Negative for itching and rash.  Neurological:  Negative for dizziness, tingling, tremors, sensory change, speech change, focal weakness, seizures, loss of consciousness, weakness and headaches.  Psychiatric/Behavioral:  Negative for depression, hallucinations (denies hallucinations), memory loss, substance abuse (Hx. THC use disorder) and suicidal ideas. The patient is not nervous/anxious and does not have insomnia.    Blood pressure 128/72, pulse 94, temperature 98.1 F (36.7 C), temperature source Oral, resp. rate 18, height 5\' 11"  (1.803 m), weight 114.8 kg, SpO2 100 %. Body mass index is 35.29 kg/m.  Treatment Plan Summary: Daily contact with patient to assess and evaluate symptoms and progress in treatment and Medication management.   Continue inpatient hospitalization.  Will continue today 05/31/2021 plan as below except where it is noted.   Diagnoses:  Schizoaffective disorder, bipolar-type.  Cannabis use disorder.  Other medical issues:  Diabetes Mellitus.  HTN (essential).  Plan.  Schizoaffective disorder:  -Continue Risperdal-ODT 3 mg po daily in the morning -Continue Risperdal-ODT 4 mg po nightly at bedtime -Continue Seroquel 50 mg po Q bedtime. -Continue Depakote 500 mg po bid.  EPS prevention. -Continue Cogentin to 0.5 mg po bid.  Anxiety.  -Continue Ativan 0.5 mg PO BID -Continue Vistaril 25 mg po tid prn. -Continue Propranolol 10 mg po bid for anxiety/elevated heart rate.  Insomnia.  -Continue Trazodone 50 mg po Q hs prn.  UTI:  Bactrim Ds 800-160 mg po bid x 7 days :(completed).  Vitamin D  deficiency:  Continue Vitamin D 1,000 units daily  Agitation/psychosis.  Continue Olanzapine 10 mg po or IM bid prn. Continue lorazepam 1 mg po Q 6 hrs prn for anxiety.  Other medical issues, continue:  Lipitor 80 mg po daily for high cholesterol.  Insulin glargine-yfgn 16 units subQ daily for DM. Metformin 1,000 mg po bid for diabetes mellitus. Lisinopril 5 mg po daily for HTN. Continue Ferrous sulfate 325 mg po bid for anemia. Continue the sliding scale insulin as recommended per CBG results. Continue Colace 100 mg daily for constipation prophylaxis  Labs ordered for AM draw tomorrow: CMP, CBC with Diff, BNP and Valproic Acid level New Labs reviewed: BNP WNL, Valproic acid level WNL at 62. BP slightly elevated today as above, but will recheck.  Discharge planning in progress.  Nicholes Rough, NP  05/31/2021, 11:37 AM   Patient ID: [name], female   DOB: December 24, 1975, 45 y.o.   MRN: [id number]

## 2021-05-31 NOTE — [facility] Group Notes (Signed)
Adult Psychoeducational Group Note  Date:  05/31/2021 Time:  9:13 AM  Group Topic/Focus:  Goals Group:   The focus of this group is to help patients establish daily goals to achieve during treatment and discuss how the patient can incorporate goal setting into their daily lives to aide in recovery.  Participation Level:  Active  Participation Quality:  Appropriate  Affect:  Appropriate  Cognitive:  Appropriate  Insight: Appropriate  Engagement in Group:  Engaged  Modes of Intervention:  Discussion  Additional Comments:  Pt. Goal: Med Management memorization   Dub Mikes 05/31/2021, 9:13 AM

## 2021-06-01 ENCOUNTER — Encounter (HOSPITAL_COMMUNITY): Payer: Self-pay

## 2021-06-01 LAB — GLUCOSE, CAPILLARY: Glucose-Capillary: 205 mg/dL — ABNORMAL HIGH (ref 70–99)

## 2021-06-01 MED ORDER — BENZTROPINE MESYLATE 0.5 MG PO TABS: 0.5000 mg | ORAL_TABLET | Freq: Two times a day (BID) | ORAL | 0 refills | Status: DC

## 2021-06-01 MED ORDER — TRAZODONE HCL 50 MG PO TABS: 50.0000 mg | ORAL_TABLET | Freq: Every evening | ORAL | 0 refills | Status: DC | PRN

## 2021-06-01 MED ORDER — HYDROXYZINE HCL 25 MG PO TABS: 25.0000 mg | ORAL_TABLET | Freq: Three times a day (TID) | ORAL | 0 refills | Status: DC | PRN

## 2021-06-01 MED ORDER — ATORVASTATIN CALCIUM 80 MG PO TABS: 80.0000 mg | ORAL_TABLET | Freq: Every day | ORAL | 0 refills | Status: DC

## 2021-06-01 MED ORDER — INSULIN GLARGINE-YFGN 100 UNIT/ML ~~LOC~~ SOLN
16.0000 [IU] | Freq: Every day | SUBCUTANEOUS | 0 refills | Status: DC
Start: 2021-06-01 — End: 2021-06-01

## 2021-06-01 MED ORDER — METFORMIN HCL 1000 MG PO TABS: 1000.0000 mg | ORAL_TABLET | Freq: Two times a day (BID) | ORAL | 0 refills | Status: DC

## 2021-06-01 MED ORDER — FERROUS SULFATE 325 (65 FE) MG PO TABS: 325.0000 mg | ORAL_TABLET | Freq: Two times a day (BID) | ORAL | 0 refills | Status: DC

## 2021-06-01 MED ORDER — LORAZEPAM 0.5 MG PO TABS: 0.5000 mg | ORAL_TABLET | Freq: Two times a day (BID) | ORAL | 0 refills | Status: DC

## 2021-06-01 MED ORDER — INSULIN ASPART 100 UNIT/ML IJ SOLN
0.0000 [IU] | Freq: Three times a day (TID) | INTRAMUSCULAR | 0 refills | Status: DC
Start: 2021-06-01 — End: 2022-11-13

## 2021-06-01 MED ORDER — INSULIN GLARGINE-YFGN 100 UNIT/ML ~~LOC~~ SOLN: 16.0000 [IU] | Freq: Every day | SUBCUTANEOUS | 0 refills | Status: DC

## 2021-06-01 MED ORDER — VITAMIN D3 25 MCG PO TABS: 1000.0000 [IU] | ORAL_TABLET | Freq: Every day | ORAL | 0 refills | Status: DC

## 2021-06-01 MED ORDER — LISINOPRIL 5 MG PO TABS: 5.0000 mg | ORAL_TABLET | Freq: Every day | ORAL | 0 refills | Status: DC

## 2021-06-01 MED ORDER — DIVALPROEX SODIUM 500 MG PO DR TAB: 500.0000 mg | DELAYED_RELEASE_TABLET | Freq: Two times a day (BID) | ORAL | 0 refills | Status: DC

## 2021-06-01 MED ORDER — PROPRANOLOL HCL 10 MG PO TABS: 10.0000 mg | ORAL_TABLET | Freq: Two times a day (BID) | ORAL | 0 refills | Status: DC

## 2021-06-01 MED ORDER — QUETIAPINE FUMARATE 50 MG PO TABS: 50.0000 mg | ORAL_TABLET | Freq: Every day | ORAL | 0 refills | Status: DC

## 2021-06-01 MED ORDER — HYDROXYZINE HCL 25 MG PO TABS
25.0000 mg | ORAL_TABLET | Freq: Three times a day (TID) | ORAL | 0 refills | Status: DC | PRN
Start: 2021-06-01 — End: 2021-06-01

## 2021-06-01 MED ORDER — ACCU-CHEK SOFTCLIX LANCETS MISC
0 refills | Status: DC
Start: 2021-06-01 — End: 2021-06-01

## 2021-06-01 MED ORDER — RISPERIDONE 3 MG PO TBDP: 3.0000 mg | ORAL_TABLET | Freq: Every day | ORAL | 0 refills | Status: DC

## 2021-06-01 MED ORDER — DOCUSATE SODIUM 100 MG PO CAPS: 100.0000 mg | ORAL_CAPSULE | Freq: Every day | ORAL | 0 refills | Status: DC

## 2021-06-01 MED ORDER — ACCU-CHEK SOFTCLIX LANCETS MISC: 0 refills | Status: DC

## 2021-06-01 MED ORDER — INSULIN ASPART 100 UNIT/ML IJ SOLN: 0.0000 [IU] | Freq: Three times a day (TID) | INTRAMUSCULAR | 0 refills | Status: DC

## 2021-06-01 MED ORDER — ATORVASTATIN CALCIUM 80 MG PO TABS
80.0000 mg | ORAL_TABLET | Freq: Every day | ORAL | 0 refills | Status: DC
Start: 2021-06-01 — End: 2021-06-01

## 2021-06-01 MED ORDER — RISPERIDONE 4 MG PO TBDP: 4.0000 mg | ORAL_TABLET | Freq: Every day | ORAL | 0 refills | Status: DC
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
## 2021-06-01 NOTE — Progress Notes (Signed)
Pt discharged to lobby. Pt was stable and appreciative at that time. All papers and prescriptions were given and valuables returned. Verbal understanding expressed. Denies SI/HI and A/VH. Pt given opportunity to express concerns and ask questions.  
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
## 2021-06-01 NOTE — BHH Group Notes (Signed)
Adult Psychoeducational Group Note  Date:  06/01/2021 Time:  10:27 AM  Group Topic/Focus:  Orientation:   The focus of this group is to educate the patient on the purpose and policies of crisis stabilization and provide a format to answer questions about their admission.  The group details unit policies and expectations of patients while admitted.  Participation Level:  Active  Participation Quality:  Appropriate  Affect:  Appropriate  Cognitive:  Appropriate  Insight: Appropriate  Engagement in Group:  Engaged  Modes of Intervention:  Education  Additional Comments:  Patient's goal for today "discharge plan, get meds filled, make it to destination". We discussed ways that she can stay on track with her meds such as weekly pill dividers, med tracking apps on her smartphone, etc. Pt is in good spirits today. Denies any thoughts of suicide/self harm.   Kern Reap 06/01/2021, 10:27 AM

## 2021-06-01 NOTE — Plan of Care (Signed)
Patient was able to identify how some of the things from groups could be used as coping skills at completion of recreation therapy group sessions.   Victorino Sparrow, LRT,CTRS

## 2021-06-01 NOTE — BH IP Treatment Plan (Signed)
Interdisciplinary Treatment and Diagnostic Plan Update  06/01/2021 [name] MRN: [id number]  Principal Diagnosis: Schizoaffective disorder, bipolar type [facility])  Secondary Diagnoses: Principal Problem:   Schizoaffective disorder, bipolar type ([place]) Active Problems:   Cannabis use disorder, moderate, dependence ([place])   Current Medications:  Current Facility-Administered Medications  Medication Dose Route Frequency Provider Last Rate Last Admin   acetaminophen (TYLENOL) tablet 650 mg  650 mg Oral Q4H PRN Lindell Spar I, NP   650 mg at 05/29/21 0620   alum & mag hydroxide-simeth (MAALOX/MYLANTA) 200-200-20 MG/5ML suspension 30 mL  30 mL Oral Q6H PRN Massengill, Ovid Curd, MD       atorvastatin (LIPITOR) tablet 80 mg  80 mg Oral Daily Rankin, Shuvon B, NP   80 mg at 06/01/21 9326   benztropine (COGENTIN) tablet 0.5 mg  0.5 mg Oral BID Nicholes Rough, NP   0.5 mg at 06/01/21 0829   divalproex (DEPAKOTE) DR tablet 500 mg  500 mg Oral BID Lindell Spar I, NP   500 mg at 06/01/21 7124   docusate sodium (COLACE) capsule 100 mg  100 mg Oral Daily Lindell Spar I, NP   100 mg at 06/01/21 5809   ferrous sulfate tablet 325 mg  325 mg Oral BID WC Lindell Spar I, NP   325 mg at 06/01/21 9833   hydrOXYzine (ATARAX/VISTARIL) tablet 25 mg  25 mg Oral TID PRN Prescilla Sours, PA-C   25 mg at 05/27/21 2041   insulin aspart (novoLOG) injection 0-5 Units  0-5 Units Subcutaneous QHS Prescilla Sours, PA-C   2 Units at 05/31/21 2036   insulin aspart (novoLOG) injection 0-9 Units  0-9 Units Subcutaneous TID WC Rankin, Shuvon B, NP   3 Units at 06/01/21 0639   insulin glargine-yfgn (SEMGLEE) injection 16 Units  16 Units Subcutaneous Daily Massengill, Ovid Curd, MD   16 Units at 06/01/21 0829   lisinopril (ZESTRIL) tablet 5 mg  5 mg Oral Daily Rankin, Shuvon B, NP   5 mg at 06/01/21 8250   LORazepam (ATIVAN) tablet 0.5 mg  0.5 mg Oral BID Ethelene Hal, NP   0.5 mg at 06/01/21 0829   LORazepam (ATIVAN) tablet 1 mg  1  mg Oral Q6H PRN Lindell Spar I, NP   1 mg at 05/24/21 1516   metFORMIN (GLUCOPHAGE) tablet 1,000 mg  1,000 mg Oral BID WC Rankin, Shuvon B, NP   1,000 mg at 06/01/21 0828   OLANZapine (ZYPREXA) injection 10 mg  10 mg Intramuscular BID PRN Briant Cedar, MD       OLANZapine (ZYPREXA) tablet 10 mg  10 mg Oral BID PRN Briant Cedar, MD   10 mg at 05/21/21 1618   propranolol (INDERAL) tablet 10 mg  10 mg Oral BID Massengill, Ovid Curd, MD   10 mg at 06/01/21 5397   QUEtiapine (SEROQUEL) tablet 50 mg  50 mg Oral QHS Ethelene Hal, NP   50 mg at 05/31/21 2034   risperiDONE (RISPERDAL M-TABS) disintegrating tablet 3 mg  3 mg Oral Daily Ethelene Hal, NP   3 mg at 06/01/21 6734   risperiDONE (RISPERDAL M-TABS) disintegrating tablet 4 mg  4 mg Oral QHS Ethelene Hal, NP   4 mg at 05/31/21 2035   traZODone (DESYREL) tablet 50 mg  50 mg Oral QHS PRN Prescilla Sours, PA-C   50 mg at 05/31/21 2034   Vitamin D3 (Vitamin D) tablet 1,000 Units  1,000 Units Oral Daily Ethelene Hal, NP  1,000 Units at 06/01/21 6546   Current Outpatient Medications  Medication Sig Dispense Refill   Accu-Chek Softclix Lancets lancets Use to check FSBS BID. Dx: E11.42 100 each 2   Accu-Chek Softclix Lancets lancets Use as directed. 100 each 0   atorvastatin (LIPITOR) 80 MG tablet Take 1 tablet (80 mg total) by mouth daily. Elevated cholesterol 30 tablet 0   benztropine (COGENTIN) 0.5 MG tablet Take 1 tablet (0.5 mg total) by mouth 2 (two) times daily. tremors 60 tablet 0   Blood Glucose Monitoring Suppl (ACCU-CHEK GUIDE ME) w/Device KIT Use to check FSBS BID. Dx: E11.42 1 kit 0   divalproex (DEPAKOTE) 500 MG DR tablet Take 1 tablet (500 mg total) by mouth 2 (two) times daily. Mood disorder 30 tablet 0   [START ON 06/02/2021] docusate sodium (COLACE) 100 MG capsule Take 1 capsule (100 mg total) by mouth daily. Stool softener 10 capsule 0   ferrous sulfate 325 (65 FE) MG tablet Take 1 tablet  (325 mg total) by mouth 2 (two) times daily with a meal. Iron supplement 30 tablet 0   glucose blood (TRUE METRIX BLOOD GLUCOSE TEST) test strip Use as instructed 100 each 12   hydrOXYzine (ATARAX) 25 MG tablet Take 1 tablet (25 mg total) by mouth 3 (three) times daily as needed for anxiety. For anxiety 30 tablet 0   insulin aspart (NOVOLOG) 100 UNIT/ML injection Inject 0-9 Units into the skin 3 (three) times daily with meals. For elevated blood glucose 10 mL 0   insulin glargine-yfgn (SEMGLEE) 100 UNIT/ML injection Inject 0.16 mLs (16 Units total) into the skin daily. High blood glucose 10 mL 0   lisinopril (ZESTRIL) 5 MG tablet Take 1 tablet (5 mg total) by mouth daily. 90 tablet 0   lisinopril (ZESTRIL) 5 MG tablet Take 1 tablet (5 mg total) by mouth daily. For high blood pressure 30 tablet 0   LORazepam (ATIVAN) 0.5 MG tablet Take 1 tablet (0.5 mg total) by mouth 2 (two) times daily. For anxiety 60 tablet 0   metFORMIN (GLUCOPHAGE) 1000 MG tablet Take 1 tablet (1,000 mg total) by mouth 2 (two) times daily with a meal. diabetes 60 tablet 0   propranolol (INDERAL) 10 MG tablet Take 1 tablet (10 mg total) by mouth 2 (two) times daily. For anxiety and hypertension 30 tablet 0   QUEtiapine (SEROQUEL) 50 MG tablet Take 1 tablet (50 mg total) by mouth at bedtime. For mood disorder 30 tablet 0   [START ON 06/02/2021] risperiDONE (RISPERDAL M-TABS) 3 MG disintegrating tablet Take 1 tablet (3 mg total) by mouth daily. Mood disorder. 30 tablet 0   risperiDONE (RISPERDAL M-TABS) 4 MG disintegrating tablet Take 1 tablet (4 mg total) by mouth at bedtime. For mood disorder 30 tablet 0   traZODone (DESYREL) 50 MG tablet Take 1 tablet (50 mg total) by mouth at bedtime as needed for sleep. For sleep 30 tablet 0   TRUEplus Lancets 28G MISC Use as directed 100 each 1   [START ON 06/02/2021] Vitamin D3 (VITAMIN D) 25 MCG tablet Take 1 tablet (1,000 Units total) by mouth daily. Vitamin D supplement 60 tablet 0   PTA  Medications: No medications prior to admission.    Patient Stressors: Other: unable to verbalize    Patient Strengths: Capable of independent living  Communication skills  Motivation for treatment/growth  Supportive family/friends  Work skills   Treatment Modalities: Medication Management, Group therapy, Case management,  1 to 1 session with clinician, Psychoeducation,  Recreational therapy.   Physician Treatment Plan for Primary Diagnosis: Schizoaffective disorder, bipolar type ([place]) Long Term Goal(s): Improvement in symptoms so as ready for discharge   Short Term Goals: Ability to identify changes in lifestyle to reduce recurrence of condition will improve Ability to verbalize feelings will improve Ability to disclose and discuss suicidal ideas Ability to demonstrate self-control will improve Ability to identify and develop effective coping behaviors will improve Ability to maintain clinical measurements within normal limits will improve Compliance with prescribed medications will improve Ability to identify triggers associated with substance abuse/mental health issues will improve  Medication Management: Evaluate patient's response, side effects, and tolerance of medication regimen.  Therapeutic Interventions: 1 to 1 sessions, Unit Group sessions and Medication administration.  Evaluation of Outcomes: Adequate for Discharge  Physician Treatment Plan for Secondary Diagnosis: Principal Problem:   Schizoaffective disorder, bipolar type ([place]) Active Problems:   Cannabis use disorder, moderate, dependence ([place])  Long Term Goal(s): Improvement in symptoms so as ready for discharge   Short Term Goals: Ability to identify changes in lifestyle to reduce recurrence of condition will improve Ability to verbalize feelings will improve Ability to disclose and discuss suicidal ideas Ability to demonstrate self-control will improve Ability to identify and develop effective coping  behaviors will improve Ability to maintain clinical measurements within normal limits will improve Compliance with prescribed medications will improve Ability to identify triggers associated with substance abuse/mental health issues will improve     Medication Management: Evaluate patient's response, side effects, and tolerance of medication regimen.  Therapeutic Interventions: 1 to 1 sessions, Unit Group sessions and Medication administration.  Evaluation of Outcomes: Adequate for Discharge   RN Treatment Plan for Primary Diagnosis: Schizoaffective disorder, bipolar type ([name]) Long Term Goal(s): Knowledge of disease and therapeutic regimen to maintain health will improve  Short Term Goals: Ability to verbalize feelings will improve, Ability to identify and develop effective coping behaviors will improve, and Compliance with prescribed medications will improve  Medication Management: RN will administer medications as ordered by provider, will assess and evaluate patient's response and provide education to patient for prescribed medication. RN will report any adverse and/or side effects to prescribing provider.  Therapeutic Interventions: 1 on 1 counseling sessions, Psychoeducation, Medication administration, Evaluate responses to treatment, Monitor vital signs and CBGs as ordered, Perform/monitor CIWA, COWS, AIMS and Fall Risk screenings as ordered, Perform wound care treatments as ordered.  Evaluation of Outcomes: Adequate for Discharge   LCSW Treatment Plan for Primary Diagnosis: Schizoaffective disorder, bipolar type ([place]) Long Term Goal(s): Safe transition to appropriate next level of care at discharge, Engage patient in therapeutic group addressing interpersonal concerns.  Short Term Goals: Engage patient in aftercare planning with referrals and resources, Increase social support, and Increase ability to appropriately verbalize feelings  Therapeutic Interventions: Assess for all  discharge needs, 1 to 1 time with Social worker, Explore available resources and support systems, Assess for adequacy in community support network, Educate family and significant other(s) on suicide prevention, Complete Psychosocial Assessment, Interpersonal group therapy.  Evaluation of Outcomes: Adequate for Discharge   Progress in Treatment: Attending groups: Yes. Participating in groups: Yes. Taking medication as prescribed: Yes. Toleration medication: Yes. Family/Significant other contact made: Yes, individual(s) contacted:  sister Patient understands diagnosis: Yes. Discussing patient identified problems/goals with staff: Yes. Medical problems stabilized or resolved: Yes. Denies suicidal/homicidal ideation: Yes. Issues/concerns per patient self-inventory: No. Other: None  New problem(s) identified: No, Describe:  None  New Short Term/Long Term Goal(s): medication stabilization, elimination  of SI thoughts, development of comprehensive mental wellness plan.   Patient Goals:  Did Not Attend  Discharge Plan or Barriers: Pt will be discharged to family who will take her to [facility]  Reason for Continuation of Hospitalization: Medication stabilization  Estimated Length of Stay: 3-5 days   Scribe for Treatment Team: Mliss Fritz, Latanya Presser 06/01/2021 2:26 PM

## 2021-06-01 NOTE — Progress Notes (Signed)
Recreation Therapy Notes  INPATIENT RECREATION TR PLAN  Patient Details Name: [name] MRN: [id number] DOB: [birth year] Today's Date: 06/01/2021  Rec Therapy Plan Is patient appropriate for Therapeutic Recreation?: Yes Treatment times per week: about 3 days Estimated Length of Stay: 5-7 days TR Treatment/Interventions: Group participation (Comment)  Discharge Criteria Pt will be discharged from therapy if:: Discharged Treatment plan/goals/alternatives discussed and agreed upon by:: Patient/family  Discharge Summary Short term goals set: See patient care plan Short term goals met: Adequate for discharge Progress toward goals comments: Groups attended Which groups?: Self-esteem, Wellness, Leisure education, Stress management Reason goals not met: None Therapeutic equipment acquired: N/A Reason patient discharged from therapy: Discharge from hospital Pt/family agrees with progress & goals achieved: Yes Date patient discharged from therapy: 06/01/21    Victorino Sparrow, Vickki Muff, [place] 06/01/2021, 12:50 PM

## 2021-06-01 NOTE — Progress Notes (Signed)
  [facility] Adult Case Management Discharge Plan :  Will you be returning to the same living situation after discharge:  No. Patient will be going to live with family supports in [place], [place] At discharge, do you have transportation home?: Yes,  patient cousin and uncle will pick patient up.  Do you have the ability to pay for your medications: Yes,  insurance.   Release of information consent forms completed and in the chart;  Patient's signature needed at discharge.  Patient to Follow up at:  [place]. Go to.   Specialty: Behavioral Health Why: You may go to this provider for therapy and medication management services during walk in hours:  Monday through Wednesday, from 7:45 am to 11:00 am.  Services are provided on a first come, first served basis so please arrive early. Contact information: [place] [phone]        [facility] Follow up on 06/08/2021.   Why: You have a hospital discharge assessment on 06/08/21 at 11:00 am to obtain therapy services.  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 Contact information: [place]  [place] [address] [phone]         [facility]. Call.   Why: Call to set up a follow up med management appointment.  You can also go to location during office hours to get an evaluation, Monday, Wednesday and Thursday, 8:30am-5pm. Contact information: [address],  [place] [address] [place], [place] [address] [phone]                Next level of care provider has access to [place] and Suicide Prevention discussed: Yes,  Varney Biles, cousin, Corky Sing, Mother.      Has patient been referred to the Quitline?: N/A patient is not a smoker  Patient has been referred for addiction treatment: [place], LCSW 06/01/2021, 10:11 AM

## 2021-06-01 NOTE — Discharge Summary (Signed)
Physician Discharge Summary Note  Patient:  [name] is an 45 y.o., female MRN:  [id number] DOB:  02/08/1976 Patient phone:  [phone] (home)  Patient address:   [place] [address],  Total Time spent with patient: 30 minutes  Date of Admission:  05/16/2021 Date of Discharge: 06/01/2021  Reason for Admission: Disorientation, confusion, hallucinations, suicidal thoughts, and delusions.   Principal Problem: Schizoaffective disorder, bipolar type ([place]) Discharge Diagnoses: Principal Problem:   Schizoaffective disorder, bipolar type ([place]) Active Problems:   Cannabis use disorder, moderate, dependence ([place])  Past Psychiatric History: As above  Past Medical History:  Past Medical History:  Diagnosis Date   Abnormal Pap smear    Bipolar 1 disorder ([place])    Diabetes mellitus without complication ([facility])    Fibroids    Hypertension    IBS (irritable bowel syndrome)     Past Surgical History:  Procedure Laterality Date   CERVICAL BIOPSY     CHOLECYSTECTOMY     ESSURE TUBAL LIGATION     HIATAL HERNIA REPAIR     TUBAL LIGATION     Family History:  Family History  Problem Relation Age of Onset   Diabetes Father    Diabetes Paternal Grandmother    Diabetes Maternal Grandmother    Mental illness Cousin    Healthy Mother    Family Psychiatric  History: As above Social History:  Social History   Substance and Sexual Activity  Alcohol Use Not Currently   Comment: occasional     Social History   Substance and Sexual Activity  Drug Use Not Currently    Social History   Socioeconomic History   Marital status: Single    Spouse name: Not on file   Number of children: 2   Years of education: Not on file   Highest education level: Master's degree (e.g., MA, MS, MEng, MEd, MSW, MBA)  Occupational History   Not on file  Tobacco Use   Smoking status: Former    Types: Cigarettes   Smokeless tobacco: Never  Vaping Use   Vaping Use: Some days    Substances: Nicotine, Flavoring  Substance and Sexual Activity   Alcohol use: Not Currently    Comment: occasional   Drug use: Not Currently   Sexual activity: Yes    Birth control/protection: Surgical  Other Topics Concern   Not on file  Social History Narrative   ** Merged History Encounter **       Social Determinants of Health   Financial Resource Strain: Not on file  Food Insecurity: Not on file  Transportation Needs: Not on file  Physical Activity: Not on file  Stress: Not on file  Social Connections: Not on file    Hospital Course:  As per admissions assessment, "Patient is a 45 year old female with a reported psychiatric history of "schizoaffective, bipolar, and personality" who was admitted to this psychiatric unit for disorientation, confusion, hallucinations, suicidal thoughts, and delusions.  Patient was medically cleared by outside hospital prior to admission to the psychiatric unit".    Prior to this discharge, [name] was seen and evaluated for mood stability. The current lab results and nurses' notes reviewed, both are stable. At this time, patient is both mentally and medically stable to be discharged to continue mental health care on an outpatient basis. This is [name]'s first inpatient admission to this [facility]. She was admitted with complaints of worsening confusion, +AH and paranoia as noted above. She was brought to the hospital  as a result, for evaluation and treatment.   After evaluation of her presenting symptoms, by her treatment team, [name] was recommended for mood stabilization treatment. She was treated and stabilized with medications as listed on her discharge medication list below. She was also enrolled in the group counseling sessions being held on this unit.  She participated and learned coping skills. She presented with no other medical issues that required treatment.  She tolerated her treatment regimen without any side effects or adverse reactions.  [name]'s mood  has stabilized. This is evidenced by her reports of improved mood, and absence of suicidal/homicidal ideations. She is now being discharged to continue mental health care on an outpatient basis. She currently denies any S/HI/AVH, delusional thoughts, or paranoia. She left [facility] with a family member who picked her up. Paper prescriptions of all of her meds were provided to her.  Physical Findings: AIMS: Facial and Oral Movements Muscles of Facial Expression: None, normal Lips and Perioral Area: None, normal Jaw: None, normal Tongue: None, normal,Extremity Movements Upper (arms, wrists, hands, fingers): None, normal Lower (legs, knees, ankles, toes): None, normal, Trunk Movements Neck, shoulders, hips: None, normal, Overall Severity Severity of abnormal movements (highest score from questions above): None, normal Incapacitation due to abnormal movements: None, normal Patient's awareness of abnormal movements (rate only patient's report): No Awareness, Dental Status Current problems with teeth and/or dentures?: No Does patient usually wear dentures?: No  CIWA:    COWS:     Musculoskeletal: Strength & Muscle Tone: within normal limits Gait & Station: normal Patient leans: N/A   Psychiatric Specialty Exam:  Presentation  General Appearance: Appropriate for Environment; Casual; Fairly Groomed  Eye Contact:Good  Speech:Clear and Coherent; Normal Rate  Speech Volume:Normal  Handedness:Right   Mood and Affect  Mood:Euthymic  Affect:Appropriate; Congruent; Full Range   Thought Process  Thought Processes:Coherent; Linear  Descriptions of Associations:Intact  Orientation:Full (Time, Place and Person)  Thought Content:Logical  History of Schizophrenia/Schizoaffective disorder:Yes  Duration of Psychotic Symptoms:Greater than six months  Hallucinations:Hallucinations: None  Ideas of Reference:None  Suicidal Thoughts:Suicidal Thoughts: No  Homicidal Thoughts:Homicidal  Thoughts: No   Sensorium  Memory:Immediate Good; Recent Good; Remote Good  Judgment:Good  Insight:Good   Executive Functions  Concentration:Good  Attention Span:Good  [place] of Knowledge:Good  Language:Good   Psychomotor Activity  Psychomotor Activity:Psychomotor Activity: Normal   Assets  Assets:Financial Resources/Insurance; Social Support   Sleep  Sleep:Sleep: Good Number of Hours of Sleep: 7    Physical Exam: Physical Exam ROS Blood pressure 124/74, pulse 98, temperature 98.4 F (36.9 C), temperature source Oral, resp. rate 16, height $RemoveBe'5\' 11"'rrNQQrWfr$  (1.803 m), weight 114.8 kg, SpO2 100 %. Body mass index is 35.29 kg/m.   Social History   Tobacco Use  Smoking Status Former   Types: Cigarettes  Smokeless Tobacco Never   Tobacco Cessation:  N/A, patient does not currently use tobacco products   Blood Alcohol level:  Lab Results  Component Value Date   ETH <10 05/12/2021   ETH <10 80/99/8338    Metabolic Disorder Labs:  Lab Results  Component Value Date   HGBA1C 9.2 (H) 05/12/2021   MPG 217.34 05/12/2021   MPG 209 08/19/2020   Lab Results  Component Value Date   PROLACTIN 11.7 05/17/2018   PROLACTIN 105.5 (H) 02/28/2017   Lab Results  Component Value Date   CHOL 101 05/17/2021   TRIG 65 05/17/2021   HDL 32 (L) 05/17/2021   CHOLHDL 3.2 05/17/2021   VLDL 13  05/17/2021   LDLCALC 56 05/17/2021   LDLCALC 49 08/18/2020    See Psychiatric Specialty Exam and Suicide Risk Assessment completed by Attending Physician prior to discharge.  Discharge destination:  Other:  home  Is patient on multiple antipsychotic therapies at discharge:  No   Has Patient had three or more failed trials of antipsychotic monotherapy by history:  Yes,   Antipsychotic medications that previously failed include:   1.  Abilify. and 2.  Haldol.  Recommended Plan for Multiple Antipsychotic Therapies: Additional reason(s) for multiple antispychotic treatment:   Resistant auditory hallucinations with the other antipsychotics listed above  Discharge Instructions     Diet - low sodium heart healthy   Complete by: As directed    Diet - low sodium heart healthy   Complete by: As directed    Increase activity slowly   Complete by: As directed    Increase activity slowly   Complete by: As directed       Allergies as of 06/01/2021   Not on File      Medication List     STOP taking these medications    ARIPiprazole 10 MG tablet Commonly known as: ABILIFY   dapagliflozin propanediol 10 MG Tabs tablet Commonly known as: FARXIGA   neomycin-polymyxin-hydrocortisone 3.5-10000-1 OTIC suspension Commonly known as: CORTISPORIN   nitrofurantoin (macrocrystal-monohydrate) 100 MG capsule Commonly known as: MACROBID   TRUEplus Pen Needles 32G X 4 MM Misc Generic drug: Insulin Pen Needle   Victoza 18 MG/3ML Sopn Generic drug: liraglutide       TAKE these medications      Indication  Accu-Chek Guide Me w/Device Kit Use to check FSBS BID. Dx: E11.42 What changed: Another medication with the same name was removed. Continue taking this medication, and follow the directions you see here.  Indication: diabetes   Accu-Chek Softclix Lancets lancets Use to check FSBS BID. Dx: E11.42 What changed: Another medication with the same name was added. Make sure you understand how and when to take each.  Indication: diabetes   TRUEplus Lancets 28G Misc Use as directed What changed: Another medication with the same name was added. Make sure you understand how and when to take each.  Indication: High Blood Sugar   Accu-Chek Softclix Lancets lancets Use as directed. What changed: You were already taking a medication with the same name, and this prescription was added. Make sure you understand how and when to take each.  Indication: High Blood Sugar   atorvastatin 80 MG tablet Commonly known as: Lipitor Take 1 tablet (80 mg total) by mouth  daily. Elevated cholesterol What changed: additional instructions  Indication: High Amount of Fats in the Blood   benztropine 0.5 MG tablet Commonly known as: COGENTIN Take 1 tablet (0.5 mg total) by mouth 2 (two) times daily. tremors  Indication: Extrapyramidal Reaction caused by Medications   divalproex 500 MG DR tablet Commonly known as: DEPAKOTE Take 1 tablet (500 mg total) by mouth 2 (two) times daily. Mood disorder What changed:  medication strength how much to take additional instructions  Indication: Mood stabilization   docusate sodium 100 MG capsule Commonly known as: COLACE Take 1 capsule (100 mg total) by mouth daily. Stool softener Start taking on: June 02, 2021  Indication: Constipation   ferrous sulfate 325 (65 FE) MG tablet Take 1 tablet (325 mg total) by mouth 2 (two) times daily with a meal. Iron supplement  Indication: Anemia From Inadequate Iron in the Body   hydrOXYzine 25 MG  tablet Commonly known as: ATARAX Take 1 tablet (25 mg total) by mouth 3 (three) times daily as needed for anxiety. For anxiety  Indication: Feeling Anxious   insulin aspart 100 UNIT/ML injection Commonly known as: novoLOG Inject 0-9 Units into the skin 3 (three) times daily with meals. For elevated blood glucose  Indication: High Blood Sugar   insulin glargine-yfgn 100 UNIT/ML injection Commonly known as: SEMGLEE Inject 0.16 mLs (16 Units total) into the skin daily. High blood glucose  Indication: High Blood Sugar   lisinopril 5 MG tablet Commonly known as: ZESTRIL Take 1 tablet (5 mg total) by mouth daily. What changed: Another medication with the same name was added. Make sure you understand how and when to take each.  Indication: High Blood Pressure Disorder   lisinopril 5 MG tablet Commonly known as: ZESTRIL Take 1 tablet (5 mg total) by mouth daily. For high blood pressure What changed: You were already taking a medication with the same name, and this prescription  was added. Make sure you understand how and when to take each.  Indication: High Blood Pressure Disorder   LORazepam 0.5 MG tablet Commonly known as: ATIVAN Take 1 tablet (0.5 mg total) by mouth 2 (two) times daily. For anxiety What changed:  medication strength how much to take when to take this additional instructions  Indication: Feeling Anxious   metFORMIN 1000 MG tablet Commonly known as: Glucophage Take 1 tablet (1,000 mg total) by mouth 2 (two) times daily with a meal. diabetes What changed: additional instructions  Indication: Type 2 Diabetes   propranolol 10 MG tablet Commonly known as: INDERAL Take 1 tablet (10 mg total) by mouth 2 (two) times daily. For anxiety and hypertension  Indication: Feeling Anxious, High Blood Pressure Disorder   QUEtiapine 50 MG tablet Commonly known as: SEROQUEL Take 1 tablet (50 mg total) by mouth at bedtime. For mood disorder  Indication: Depressive Phase of Manic-Depression   risperiDONE 4 MG disintegrating tablet Commonly known as: RISPERDAL M-TABS Take 1 tablet (4 mg total) by mouth at bedtime. For mood disorder  Indication: Hypomanic Episode of Bipolar Disorder   risperiDONE 3 MG disintegrating tablet Commonly known as: RISPERDAL M-TABS Take 1 tablet (3 mg total) by mouth daily. Mood disorder. Start taking on: June 02, 2021  Indication: Hypomanic Episode of Bipolar Disorder   traZODone 50 MG tablet Commonly known as: DESYREL Take 1 tablet (50 mg total) by mouth at bedtime as needed for sleep. For sleep  Indication: Trouble Sleeping   True Metrix Blood Glucose Test test strip Generic drug: glucose blood Use as instructed  Indication: High Blood Sugar   Vitamin D3 25 MCG tablet Commonly known as: Vitamin D Take 1 tablet (1,000 Units total) by mouth daily. Vitamin D supplement Start taking on: June 02, 2021  Indication: Vitamin D Deficiency        Follow-up [place]. Go to.   Specialty: Behavioral Health Why: You may go to this provider for therapy and medication management services during walk in hours:  Monday through Wednesday, from 7:45 am to 11:00 am.  Services are provided on a first come, first served basis so please arrive early. Contact information: [place] [phone]        [facility] Follow up on 06/08/2021.   Why: You have a hospital discharge assessment on 06/08/21 at 11:00 am to obtain therapy services.  This will be a TEFL teacher  appointment. Contact information: [place]  [place] [address] [phone]         [facility]. Call.   Why: Call to set up a follow up med management appointment.  You can also go to location during office hours to get an evaluation, Monday, Wednesday and Thursday, 8:30am-5pm. Contact information: [address],  [place] [address] [place],  [address] [phone]                Follow-up recommendations:  Other:    Prescriptions given at discharge. Patient agreeable to plan.  Given opportunity to ask questions.  Appears to feel comfortable with discharge denies any current suicidal or homicidal thought. Denies paranoia or delusional thoughts. Patient is also instructed prior to discharge to: Take all medications as prescribed by his mental healthcare provider. Report any adverse effects and or reactions from the medicines to his outpatient provider promptly. Patient has been instructed & cautioned: To not engage in alcohol and or illegal drug use while on prescription medicines. In the event of worsening symptoms, patient is instructed to call the mobile crisis hotline [phone], National Suicide prevention Lifeline 979 070 6352, 911 and/or go to the nearest ED for appropriate evaluation and treatment of symptoms. To follow-up with his primary care provider for your other medical issues, concerns and or health care needs.      Signed: Nicholes Rough, NP 06/01/2021, 6:40 PM

## 2021-06-06 NOTE — Progress Notes (Signed)
Erroneous encounter

## 2021-06-08 ENCOUNTER — Encounter: Payer: Medicaid Other | Admitting: Family

## 2021-06-08 DIAGNOSIS — I10 Essential (primary) hypertension: Secondary | ICD-10-CM

## 2021-06-08 DIAGNOSIS — E1142 Type 2 diabetes mellitus with diabetic polyneuropathy: Secondary | ICD-10-CM

## 2021-07-05 DIAGNOSIS — E559 Vitamin D deficiency, unspecified: Secondary | ICD-10-CM | POA: Insufficient documentation

## 2021-07-20 ENCOUNTER — Encounter: Payer: Self-pay | Admitting: Family

## 2021-07-20 ENCOUNTER — Ambulatory Visit (INDEPENDENT_AMBULATORY_CARE_PROVIDER_SITE_OTHER): Payer: Medicaid Other | Admitting: Family

## 2021-07-20 ENCOUNTER — Other Ambulatory Visit: Payer: Self-pay

## 2021-07-20 VITALS — BP 129/76 | HR 72 | Temp 98.5°F | Resp 18 | Wt 292.0 lb

## 2021-07-20 DIAGNOSIS — Z111 Encounter for screening for respiratory tuberculosis: Secondary | ICD-10-CM

## 2021-07-20 DIAGNOSIS — R609 Edema, unspecified: Secondary | ICD-10-CM

## 2021-07-20 MED ORDER — FUROSEMIDE 20 MG PO TABS: 20.0000 mg | ORAL_TABLET | Freq: Every day | ORAL | 0 refills | Status: DC

## 2021-07-20 NOTE — Progress Notes (Signed)
Patient ID: [name], female    DOB: [birth year]  MRN: [id number]  CC: Swollen Feet   Subjective: [name] is a 46 y.o. female who presents for swollen feet.   Her concerns today include:  Reports began 2 months ago while admitted to the [facility]. Reports during that time ate a lot of bacon, grits, and salty foods mostly staying in bed or sitting all day. When discharged from [facility] told to get compression stockings, reports they do not help. Reports unable to wear normal shoes having to wear slides. Endorses pain of bilateral feet, burning sensation. Unable to take Gabapentin because discontinued per Psychiatry. Using Diflucan gel from her sister and helps with pain. Denies swelling, tenderness, pain, and warmth of bilateral lower extremities.    Patient Active Problem List   Diagnosis Date Noted   MDD (major depressive disorder), recurrent, severe, with psychosis ([place]) 05/16/2021   Candida vaginitis 04/11/2021   Essential hypertension 04/08/2019   Class 2 severe obesity due to excess calories with serious comorbidity and body mass index (BMI) of 39.0 to 39.9 in adult ([place]) 04/08/2019   Tobacco dependence 04/08/2019   Schizoaffective disorder, bipolar type ([place]) 05/18/2018   Diabetes mellitus ([place]) 08/10/2016   Bipolar disorder, curr episode mixed, severe, with psychotic features ([place]) 08/07/2016   Cannabis use disorder, moderate, dependence ([place]) 08/07/2016   CHOLECYSTITIS, UNSPECIFIED 90/24/0973   BILIARY COLIC 53/29/9242   CERUMEN IMPACTION, BILATERAL 01/17/2008   PHARYNGITIS, VIRAL 01/17/2008   NECK PAIN 01/17/2008     Current Outpatient Medications on File Prior to Visit  Medication Sig Dispense Refill   Accu-Chek Softclix Lancets lancets Use to check FSBS BID. Dx: E11.42 100 each 2   Accu-Chek Softclix Lancets lancets Use as directed. 100 each 0   atorvastatin (LIPITOR) 80 MG tablet Take 1 tablet (80 mg total) by mouth daily.  Elevated cholesterol 30 tablet 0   benztropine (COGENTIN) 0.5 MG tablet Take 1 tablet (0.5 mg total) by mouth 2 (two) times daily. tremors 60 tablet 0   Blood Glucose Monitoring Suppl (ACCU-CHEK GUIDE ME) w/Device KIT Use to check FSBS BID. Dx: E11.42 1 kit 0   divalproex (DEPAKOTE) 500 MG DR tablet Take 1 tablet (500 mg total) by mouth 2 (two) times daily. Mood disorder 30 tablet 0   docusate sodium (COLACE) 100 MG capsule Take 1 capsule (100 mg total) by mouth daily. Stool softener 10 capsule 0   ferrous sulfate 325 (65 FE) MG tablet Take 1 tablet (325 mg total) by mouth 2 (two) times daily with a meal. Iron supplement 30 tablet 0   glucose blood (TRUE METRIX BLOOD GLUCOSE TEST) test strip Use as instructed 100 each 12   hydrOXYzine (ATARAX) 25 MG tablet Take 1 tablet (25 mg total) by mouth 3 (three) times daily as needed for anxiety. For anxiety 30 tablet 0   insulin aspart (NOVOLOG) 100 UNIT/ML injection Inject 0-9 Units into the skin 3 (three) times daily with meals. For elevated blood glucose 10 mL 0   insulin glargine-yfgn (SEMGLEE) 100 UNIT/ML injection Inject 0.16 mLs (16 Units total) into the skin daily. High blood glucose 10 mL 0   lisinopril (ZESTRIL) 5 MG tablet Take 1 tablet (5 mg total) by mouth daily. 90 tablet 0   lisinopril (ZESTRIL) 5 MG tablet Take 1 tablet (5 mg total) by mouth daily. For high blood pressure 30 tablet 0   LORazepam (ATIVAN) 0.5 MG tablet Take 1 tablet (0.5 mg total)  by mouth 2 (two) times daily. For anxiety 60 tablet 0   metFORMIN (GLUCOPHAGE) 1000 MG tablet Take 1 tablet (1,000 mg total) by mouth 2 (two) times daily with a meal. diabetes 60 tablet 0   propranolol (INDERAL) 10 MG tablet Take 1 tablet (10 mg total) by mouth 2 (two) times daily. For anxiety and hypertension 30 tablet 0   QUEtiapine (SEROQUEL) 50 MG tablet Take 1 tablet (50 mg total) by mouth at bedtime. For mood disorder 30 tablet 0   risperiDONE (RISPERDAL M-TABS) 3 MG disintegrating tablet Take 1  tablet (3 mg total) by mouth daily. Mood disorder. 30 tablet 0   risperiDONE (RISPERDAL M-TABS) 4 MG disintegrating tablet Take 1 tablet (4 mg total) by mouth at bedtime. For mood disorder 30 tablet 0   traZODone (DESYREL) 50 MG tablet Take 1 tablet (50 mg total) by mouth at bedtime as needed for sleep. For sleep 30 tablet 0   TRUEplus Lancets 28G MISC Use as directed 100 each 1   Vitamin D3 (VITAMIN D) 25 MCG tablet Take 1 tablet (1,000 Units total) by mouth daily. Vitamin D supplement 60 tablet 0   No current facility-administered medications on file prior to visit.    Not on File  Social History   Socioeconomic History   Marital status: Single    Spouse name: Not on file   Number of children: 2   Years of education: Not on file   Highest education level: Master's degree (e.g., MA, MS, MEng, MEd, MSW, MBA)  Occupational History   Not on file  Tobacco Use   Smoking status: Former    Types: Cigarettes   Smokeless tobacco: Never  Vaping Use   Vaping Use: Some days   Substances: Nicotine, Flavoring  Substance and Sexual Activity   Alcohol use: Not Currently    Comment: occasional   Drug use: Not Currently   Sexual activity: Yes    Birth control/protection: Surgical  Other Topics Concern   Not on file  Social History Narrative   ** Merged History Encounter **       Social Determinants of Health   Financial Resource Strain: Not on file  Food Insecurity: Not on file  Transportation Needs: Not on file  Physical Activity: Not on file  Stress: Not on file  Social Connections: Not on file  Intimate Partner Violence: Not on file    Family History  Problem Relation Age of Onset   Diabetes Father    Diabetes Paternal Grandmother    Diabetes Maternal Grandmother    Mental illness Cousin    Healthy Mother     Past Surgical History:  Procedure Laterality Date   CERVICAL BIOPSY     CHOLECYSTECTOMY     ESSURE TUBAL LIGATION     HIATAL HERNIA REPAIR     TUBAL LIGATION       ROS: Review of Systems Negative except as stated above  PHYSICAL EXAM: BP 129/76    Pulse 72    Temp 98.5 F (36.9 C)    Resp 18    Wt 292 lb (132.5 kg)    SpO2 98%    BMI 40.73 kg/m   Physical Exam HENT:     Head: Normocephalic and atraumatic.  Eyes:     Extraocular Movements: Extraocular movements intact.     Conjunctiva/sclera: Conjunctivae normal.     Pupils: Pupils are equal, round, and reactive to light.  Cardiovascular:     Rate and Rhythm: Normal rate and  regular rhythm.     Pulses: Normal pulses.     Heart sounds: Normal heart sounds.  Pulmonary:     Effort: Pulmonary effort is normal.     Breath sounds: Normal breath sounds.  Musculoskeletal:     Cervical back: Normal range of motion and neck supple.     Right foot: Swelling present.     Left foot: Swelling present.  Neurological:     General: No focal deficit present.     Mental Status: She is alert and oriented to person, place, and time.  Psychiatric:        Mood and Affect: Mood normal.        Behavior: Behavior normal.    ASSESSMENT AND PLAN: 1. Dependent edema: - Furosemide as prescribed.  - Keep bilateral legs raised above (elevated) above the level of your heart when you are sitting or lying down. - Do not sit or stand for a long time.  - Do not wear tight clothes.  - Exercise your legs. This may help swelling go down.  - Wear support stockings as tolerated. - Follow-up with primary provider as scheduled.  - furosemide (LASIX) 20 MG tablet; Take 1 tablet (20 mg total) by mouth daily for 10 days.  Dispense: 10 tablet; Refill: 0    Patient was given the opportunity to ask questions.  Patient verbalized understanding of the plan and was able to repeat key elements of the plan. Patient was given clear instructions to go to Emergency Department or return to medical center if symptoms don't improve, worsen, or new problems develop.The patient verbalized understanding.    Requested Prescriptions    Signed Prescriptions Disp Refills   furosemide (LASIX) 20 MG tablet 10 tablet 0    Sig: Take 1 tablet (20 mg total) by mouth daily for 10 days.    Follow-up with primary provider as scheduled.   Camillia Herter, NP

## 2021-07-20 NOTE — Addendum Note (Signed)
Addended by: Elmon Else on: 07/20/2021 03:43 PM   Modules accepted: Orders

## 2021-07-20 NOTE — Progress Notes (Addendum)
Pt presents for bilateral foot edema that's been present for approx 2 months, symptoms include burning sensation and pain   Tuberculin skin test applied to left ventral forearm. Explained to patient to return within 48-72 hours for reading and paperwork. Patient verbalized understanding.

## 2021-07-22 LAB — TB SKIN TEST
Induration: 0 mm
TB Skin Test: NEGATIVE

## 2021-07-22 NOTE — Progress Notes (Signed)
TB negative.

## 2021-08-04 ENCOUNTER — Encounter: Payer: Self-pay | Admitting: Family

## 2021-10-09 ENCOUNTER — Other Ambulatory Visit: Payer: Self-pay | Admitting: Family

## 2021-10-09 DIAGNOSIS — R609 Edema, unspecified: Secondary | ICD-10-CM

## 2022-01-31 ENCOUNTER — Emergency Department (HOSPITAL_COMMUNITY)
Admission: EM | Admit: 2022-01-31 | Discharge: 2022-02-01 | Disposition: A | Discharge status: Other Acute Inpatient Hospital | Payer: Medicaid Other | Attending: Emergency Medicine | Admitting: Emergency Medicine

## 2022-01-31 ENCOUNTER — Other Ambulatory Visit: Payer: Self-pay

## 2022-01-31 DIAGNOSIS — F29 Unspecified psychosis not due to a substance or known physiological condition: Secondary | ICD-10-CM | POA: Diagnosis not present

## 2022-01-31 DIAGNOSIS — I10 Essential (primary) hypertension: Secondary | ICD-10-CM | POA: Insufficient documentation

## 2022-01-31 DIAGNOSIS — E119 Type 2 diabetes mellitus without complications: Secondary | ICD-10-CM | POA: Diagnosis not present

## 2022-01-31 DIAGNOSIS — R443 Hallucinations, unspecified: Secondary | ICD-10-CM | POA: Insufficient documentation

## 2022-01-31 DIAGNOSIS — D649 Anemia, unspecified: Secondary | ICD-10-CM | POA: Insufficient documentation

## 2022-01-31 DIAGNOSIS — R7309 Other abnormal glucose: Secondary | ICD-10-CM | POA: Insufficient documentation

## 2022-01-31 DIAGNOSIS — Z79899 Other long term (current) drug therapy: Secondary | ICD-10-CM | POA: Insufficient documentation

## 2022-01-31 DIAGNOSIS — F250 Schizoaffective disorder, bipolar type: Secondary | ICD-10-CM | POA: Diagnosis present

## 2022-01-31 DIAGNOSIS — F259 Schizoaffective disorder, unspecified: Secondary | ICD-10-CM

## 2022-01-31 DIAGNOSIS — R4182 Altered mental status, unspecified: Secondary | ICD-10-CM | POA: Insufficient documentation

## 2022-01-31 DIAGNOSIS — Z794 Long term (current) use of insulin: Secondary | ICD-10-CM | POA: Diagnosis not present

## 2022-01-31 DIAGNOSIS — Z7984 Long term (current) use of oral hypoglycemic drugs: Secondary | ICD-10-CM | POA: Insufficient documentation

## 2022-01-31 DIAGNOSIS — F319 Bipolar disorder, unspecified: Secondary | ICD-10-CM | POA: Diagnosis present

## 2022-01-31 DIAGNOSIS — R000 Tachycardia, unspecified: Secondary | ICD-10-CM | POA: Insufficient documentation

## 2022-01-31 DIAGNOSIS — Z20822 Contact with and (suspected) exposure to covid-19: Secondary | ICD-10-CM | POA: Diagnosis not present

## 2022-01-31 LAB — CBC WITH DIFFERENTIAL/PLATELET
Abs Immature Granulocytes: 0.04 10*3/uL (ref 0.00–0.07)
Basophils Absolute: 0.1 10*3/uL (ref 0.0–0.1)
Basophils Relative: 1 %
Eosinophils Absolute: 0 10*3/uL (ref 0.0–0.5)
Eosinophils Relative: 0 %
HCT: 36.9 % (ref 36.0–46.0)
Hemoglobin: 11.2 g/dL — ABNORMAL LOW (ref 12.0–15.0)
Immature Granulocytes: 0 %
Lymphocytes Relative: 16 %
Lymphs Abs: 1.5 10*3/uL (ref 0.7–4.0)
MCH: 23.2 pg — ABNORMAL LOW (ref 26.0–34.0)
MCHC: 30.4 g/dL (ref 30.0–36.0)
MCV: 76.4 fL — ABNORMAL LOW (ref 80.0–100.0)
Monocytes Absolute: 0.7 10*3/uL (ref 0.1–1.0)
Monocytes Relative: 8 %
Neutro Abs: 6.9 10*3/uL (ref 1.7–7.7)
Neutrophils Relative %: 75 %
Platelets: 462 10*3/uL — ABNORMAL HIGH (ref 150–400)
RBC: 4.83 MIL/uL (ref 3.87–5.11)
RDW: 17.8 % — ABNORMAL HIGH (ref 11.5–15.5)
WBC: 9.3 10*3/uL (ref 4.0–10.5)
nRBC: 0 % (ref 0.0–0.2)

## 2022-01-31 LAB — URINALYSIS, ROUTINE W REFLEX MICROSCOPIC
Bacteria, UA: NONE SEEN
Bilirubin Urine: NEGATIVE
Glucose, UA: >=500 mg/dL — AB
Hgb urine dipstick: NEGATIVE
Ketones, ur: 80 mg/dL — AB
Leukocytes,Ua: NEGATIVE
Nitrite: NEGATIVE
Protein, ur: NEGATIVE mg/dL
Specific Gravity, Urine: 1.039 — ABNORMAL HIGH (ref 1.005–1.030)
pH: 6 (ref 5.0–8.0)

## 2022-01-31 LAB — COMPREHENSIVE METABOLIC PANEL
ALT: 37 U/L (ref 0–44)
AST: 27 U/L (ref 15–41)
Albumin: 3.5 g/dL (ref 3.5–5.0)
Alkaline Phosphatase: 67 U/L (ref 38–126)
Anion gap: 12 (ref 5–15)
BUN: 7 mg/dL (ref 6–20)
CO2: 22 mmol/L (ref 22–32)
Calcium: 9.8 mg/dL (ref 8.9–10.3)
Chloride: 105 mmol/L (ref 98–111)
Creatinine, Ser: 0.85 mg/dL (ref 0.44–1.00)
GFR, Estimated: >60 mL/min (ref >60)
Glucose, Bld: 205 mg/dL — ABNORMAL HIGH (ref 70–99)
Potassium: 3.7 mmol/L (ref 3.5–5.1)
Sodium: 139 mmol/L (ref 135–145)
Total Bilirubin: 0.9 mg/dL (ref 0.3–1.2)
Total Protein: 7.4 g/dL (ref 6.5–8.1)

## 2022-01-31 LAB — RESP PANEL BY RT-PCR (FLU A&B, COVID) ARPGX2
Influenza A by PCR: NEGATIVE
Influenza B by PCR: NEGATIVE
SARS Coronavirus 2 by RT PCR: NEGATIVE

## 2022-01-31 LAB — HEMOGLOBIN A1C
Hgb A1c MFr Bld: 13.6 % — ABNORMAL HIGH (ref 4.8–5.6)
Mean Plasma Glucose: 343.62 mg/dL

## 2022-01-31 LAB — CBG MONITORING, ED
Glucose-Capillary: 189 mg/dL — ABNORMAL HIGH (ref 70–99)
Glucose-Capillary: 201 mg/dL — ABNORMAL HIGH (ref 70–99)

## 2022-01-31 LAB — RAPID URINE DRUG SCREEN, HOSP PERFORMED
Amphetamines: NOT DETECTED
Barbiturates: NOT DETECTED
Benzodiazepines: NOT DETECTED
Cocaine: NOT DETECTED
Opiates: NOT DETECTED
Tetrahydrocannabinol: POSITIVE — AB

## 2022-01-31 LAB — SALICYLATE LEVEL: Salicylate Lvl: <7 mg/dL — ABNORMAL LOW (ref 7.0–30.0)

## 2022-01-31 LAB — I-STAT BETA HCG BLOOD, ED (MC, WL, AP ONLY): I-stat hCG, quantitative: <5 m[IU]/mL (ref <5)

## 2022-01-31 LAB — ETHANOL: Alcohol, Ethyl (B): <10 mg/dL (ref <10)

## 2022-01-31 LAB — ACETAMINOPHEN LEVEL: Acetaminophen (Tylenol), Serum: <10 ug/mL — ABNORMAL LOW (ref 10–30)

## 2022-01-31 MED ORDER — SODIUM CHLORIDE 0.9 % IV BOLUS
1000.0000 mL | Freq: Once | INTRAVENOUS | Status: AC
  Administered 2022-01-31: 1000 mL via INTRAVENOUS

## 2022-01-31 MED ORDER — LORAZEPAM 1 MG PO TABS
1.0000 mg | ORAL_TABLET | Freq: Once | ORAL | Status: AC | PRN
  Administered 2022-01-31: 1 mg via ORAL
  Filled 2022-01-31: qty 1

## 2022-01-31 MED ORDER — DIVALPROEX SODIUM 500 MG PO DR TAB
500.0000 mg | DELAYED_RELEASE_TABLET | Freq: Two times a day (BID) | ORAL | Status: DC
  Administered 2022-01-31 – 2022-02-01 (×3): 500 mg via ORAL
  Filled 2022-01-31 (×3): qty 1

## 2022-01-31 MED ORDER — QUETIAPINE FUMARATE 50 MG PO TABS
50.0000 mg | ORAL_TABLET | Freq: Every day | ORAL | Status: DC
  Administered 2022-01-31: 50 mg via ORAL
  Filled 2022-01-31: qty 1

## 2022-01-31 MED ORDER — LISINOPRIL 2.5 MG PO TABS
5.0000 mg | ORAL_TABLET | Freq: Every day | ORAL | Status: DC
  Administered 2022-01-31 – 2022-02-01 (×2): 5 mg via ORAL
  Filled 2022-01-31 (×2): qty 2

## 2022-01-31 MED ORDER — INSULIN ASPART 100 UNIT/ML IJ SOLN
0.0000 [IU] | Freq: Three times a day (TID) | INTRAMUSCULAR | Status: DC
  Administered 2022-01-31 – 2022-02-01 (×3): 2 [IU] via SUBCUTANEOUS

## 2022-01-31 MED ORDER — ARIPIPRAZOLE 10 MG PO TABS: 10.0000 mg | ORAL_TABLET | Freq: Every day | ORAL | Status: DC

## 2022-01-31 MED ORDER — ATORVASTATIN CALCIUM 80 MG PO TABS
80.0000 mg | ORAL_TABLET | Freq: Every day | ORAL | Status: DC
  Administered 2022-01-31 – 2022-02-01 (×2): 80 mg via ORAL
  Filled 2022-01-31 (×2): qty 1

## 2022-01-31 MED ORDER — RISPERIDONE 1 MG PO TBDP
1.0000 mg | ORAL_TABLET | Freq: Every day | ORAL | Status: DC
  Administered 2022-01-31: 1 mg via ORAL
  Filled 2022-01-31: qty 1
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
## 2022-01-31 NOTE — ED Notes (Signed)
Pt is very tearful at this time. Stating "I just can't do it anymore" when asked what she means she starts crying harder and will not answer my questions. Pt also initially during the night stated that she could not get up, but after a lot of direction the Pt was able to stand on her own and move from bed to bedside commode and back by herself.

## 2022-01-31 NOTE — ED Notes (Signed)
Pt is unable to provide any real details. Pt is able to tell me her name and that she is at [facility] in [place]. Pt is very tearful, often crying and repeating "I'm sorry" and "its okay". Pt has blank stare and is anxiously looking around room. Pt does state she has pain all over

## 2022-01-31 NOTE — Consult Note (Signed)
[facility] ED ASSESSMENT   Reason for Consult:  Dr. Laverta Baltimore Referring Physician:  Evaluation Patient Identification: [name] MRN:  [id number] ED Chief Complaint: Psychosis [facility])  Diagnosis:  Principal Problem:   Psychosis ([place]) Active Problems:   Schizoaffective disorder, bipolar type [facility])   ED Assessment Time Calculation: Start Time: 0258 Stop Time: 1450 Total Time in Minutes (Assessment Completion): 30   Subjective:   [name] is a 46 y.o. female patient who was brought to [facility] ED by EMS due to the patient reporting hallucinating and her altered mental status.   HPI:   Chart review indicates patient has a history of schizoaffective disorder, bipolar type.  It appears in November 2022 patient was admitted to [facility] ED for very similar presentation of partial orientation, some thought blocking, medication noncompliance, psychosis.  Patient seen today at [facility] ED for face-to-face evaluation.  Patient is very difficult to engage in conversation.  She has a blank stare, appears to be thought blocking, will answer some questions and not others.  She is somewhat oriented, she is able to tell me her name, current location, and year.  Patient unable to tell me what brought her to the hospital she kept stating "I'm so sorry I do not know".  Patient had a lot of difficulty answering open-ended questions.  When I asked her if she was having any auditory visual hallucinations she would just stare at me with no response.  She would randomly say words such as "my kids my kids" or "I do not know everything" or "I need to go to [place]".  She was able to mention she lives in [place] with a cousin. I tried to get her to explain how she ended up in [place] today if she has been living in [place] and she was unable to come up with a response. She was unable to engage in a linear and coherent conversation.  ED RN stated she has been able to get up and walk to the bathroom, she can physically move on her own with  encouragement.  Pt is unable to tell me the last time she took her psychiatric medications, she would just stare at me. She was unable to tell me if she has been sleeping well at night or not. I asked her if there was a family member or friend I could call on her behalf and she stated "please no please no" and appeared tearful.  In November 2022 patient had very similar presentations upon admission to [facility] ED and was transferred to [facility] for inpatient treatment.  Her Abilify was discontinued and she was started on Depakote, risperidone, and Seroquel.  Will continue with that regimen, as it was successful for her before.  Patient has been medically cleared by EDP. Pt urine toxicology screening was positive for THC, could be possible substance induced. Will recommend inpatient psychiatric treatment at this time.   Past Psychiatric History:  Previous history of schizoaffective disorder, bipolar type.  Patient last psychiatric hospitalization was in November 2022.  Risk to Self or Others: Is the patient at risk to self? Yes Has the patient been a risk to self in the past 6 months? No Has the patient been a risk to self within the distant past? No Is the patient a risk to others? No Has the patient been a risk to others in the past 6 months? No Has the patient been a risk to others within the distant past? No  [place] Scale:  [place] ED from  01/31/2022 in [place] Admission (Discharged) from 05/16/2021 in [place] [facility] ED from 05/12/2021 in [place] No Risk No Risk No Risk        Past Medical History:  Past Medical History:  Diagnosis Date   Abnormal Pap smear    Bipolar 1 disorder ([place])    Diabetes mellitus without complication (HCC)    Fibroids    Hypertension    IBS (irritable bowel syndrome)     Past Surgical History:  Procedure Laterality Date    CERVICAL BIOPSY     CHOLECYSTECTOMY     ESSURE TUBAL LIGATION     HIATAL HERNIA REPAIR     TUBAL LIGATION     Family History:  Family History  Problem Relation Age of Onset   Diabetes Father    Diabetes Paternal Grandmother    Diabetes Maternal Grandmother    Mental illness Cousin    Healthy Mother     Social History:  Social History   Substance and Sexual Activity  Alcohol Use Not Currently   Comment: occasional     Social History   Substance and Sexual Activity  Drug Use Not Currently    Social History   Socioeconomic History   Marital status: Single    Spouse name: Not on file   Number of children: 2   Years of education: Not on file   Highest education level: Master's degree (e.g., MA, MS, MEng, MEd, MSW, MBA)  Occupational History   Not on file  Tobacco Use   Smoking status: Former    Types: Cigarettes   Smokeless tobacco: Never  Vaping Use   Vaping Use: Some days   Substances: Nicotine, Flavoring  Substance and Sexual Activity   Alcohol use: Not Currently    Comment: occasional   Drug use: Not Currently   Sexual activity: Yes    Birth control/protection: Surgical  Other Topics Concern   Not on file  Social History Narrative   ** Merged History Encounter **       Social Determinants of Health   Financial Resource Strain: Not on file  Food Insecurity: Not on file  Transportation Needs: Not on file  Physical Activity: Not on file  Stress: Not on file  Social Connections: Not on file   Additional Social History:    Allergies:  Not on File  Labs:  Results for orders placed or performed during the hospital encounter of 01/31/22 (from the past 48 hour(s))  Resp Panel by RT-PCR (Flu A&B, Covid) Anterior Nasal Swab     Status: None   Collection Time: 01/31/22  3:13 AM   Specimen: Anterior Nasal Swab  Result Value Ref Range   SARS Coronavirus 2 by RT PCR NEGATIVE NEGATIVE    Comment: (NOTE) SARS-CoV-2 target nucleic acids are NOT  DETECTED.  The SARS-CoV-2 RNA is generally detectable in upper respiratory specimens during the acute phase of infection. The lowest concentration of SARS-CoV-2 viral copies this assay can detect is 138 copies/mL. A negative result does not preclude SARS-Cov-2 infection and should not be used as the sole basis for treatment or other patient management decisions. A negative result may occur with  improper specimen collection/handling, submission of specimen other than nasopharyngeal swab, presence of viral mutation(s) within the areas targeted by this assay, and inadequate number of viral copies(<138 copies/mL). A negative result must be combined with clinical observations, patient history, and epidemiological  information. The expected result is Negative.  Fact Sheet for Patients:  EntrepreneurPulse.com.au  Fact Sheet for Healthcare Providers:  IncredibleEmployment.be  This test is no t yet approved or cleared by the [place] FDA and  has been authorized for detection and/or diagnosis of SARS-CoV-2 by FDA under an Emergency Use Authorization (EUA). This EUA will remain  in effect (meaning this test can be used) for the duration of the COVID-19 declaration under Section 564(b)(1) of the Act, 21 U.S.C.section 360bbb-3(b)(1), unless the authorization is terminated  or revoked sooner.       Influenza A by PCR NEGATIVE NEGATIVE   Influenza B by PCR NEGATIVE NEGATIVE    Comment: (NOTE) The Xpert Xpress SARS-CoV-2/FLU/RSV plus assay is intended as an aid in the diagnosis of influenza from Nasopharyngeal swab specimens and should not be used as a sole basis for treatment. Nasal washings and aspirates are unacceptable for Xpert Xpress SARS-CoV-2/FLU/RSV testing.  Fact Sheet for Patients: EntrepreneurPulse.com.au  Fact Sheet for Healthcare Providers: IncredibleEmployment.be  This test is not yet approved or  cleared by the [place] FDA and has been authorized for detection and/or diagnosis of SARS-CoV-2 by FDA under an Emergency Use Authorization (EUA). This EUA will remain in effect (meaning this test can be used) for the duration of the COVID-19 declaration under Section 564(b)(1) of the Act, 21 U.S.C. section 360bbb-3(b)(1), unless the authorization is terminated or revoked.  Performed at [facility], [place] [address], [place], [place] [address]   Comprehensive metabolic panel     Status: Abnormal   Collection Time: 01/31/22  3:14 AM  Result Value Ref Range   Sodium 139 135 - 145 mmol/L   Potassium 3.7 3.5 - 5.1 mmol/L   Chloride 105 98 - 111 mmol/L   CO2 22 22 - 32 mmol/L   Glucose, Bld 205 (H) 70 - 99 mg/dL    Comment: Glucose reference range applies only to samples taken after fasting for at least 8 hours.   BUN 7 6 - 20 mg/dL   Creatinine, Ser 0.85 0.44 - 1.00 mg/dL   Calcium 9.8 8.9 - 10.3 mg/dL   Total Protein 7.4 6.5 - 8.1 g/dL   Albumin 3.5 3.5 - 5.0 g/dL   AST 27 15 - 41 U/L   ALT 37 0 - 44 U/L   Alkaline Phosphatase 67 38 - 126 U/L   Total Bilirubin 0.9 0.3 - 1.2 mg/dL   GFR, Estimated >60 >60 mL/min    Comment: (NOTE) Calculated using the CKD-EPI Creatinine Equation (2021)    Anion gap 12 5 - 15    Comment: Performed at [facility] [address], [place], [place] [address]  Ethanol     Status: None   Collection Time: 01/31/22  3:14 AM  Result Value Ref Range   Alcohol, Ethyl (B) <10 <10 mg/dL    Comment: (NOTE) Lowest detectable limit for serum alcohol is 10 mg/dL.  For medical purposes only. Performed at [facility], [place] [address] [place], [place] [address]   CBC with Diff     Status: Abnormal   Collection Time: 01/31/22  3:14 AM  Result Value Ref Range   WBC 9.3 4.0 - 10.5 K/uL   RBC 4.83 3.87 - 5.11 MIL/uL   Hemoglobin 11.2 (L) 12.0 - 15.0 g/dL   HCT 36.9 36.0 - 46.0 %   MCV 76.4 (L) 80.0 - 100.0 fL   MCH 23.2 (L) 26.0 -  34.0 pg  MCHC 30.4 30.0 - 36.0 g/dL   RDW 17.8 (H) 11.5 - 15.5 %   Platelets 462 (H) 150 - 400 K/uL   nRBC 0.0 0.0 - 0.2 %   Neutrophils Relative % 75 %   Neutro Abs 6.9 1.7 - 7.7 K/uL   Lymphocytes Relative 16 %   Lymphs Abs 1.5 0.7 - 4.0 K/uL   Monocytes Relative 8 %   Monocytes Absolute 0.7 0.1 - 1.0 K/uL   Eosinophils Relative 0 %   Eosinophils Absolute 0.0 0.0 - 0.5 K/uL   Basophils Relative 1 %   Basophils Absolute 0.1 0.0 - 0.1 K/uL   Immature Granulocytes 0 %   Abs Immature Granulocytes 0.04 0.00 - 0.07 K/uL    Comment: Performed at [facility] [address], [place], [place] [address]  Acetaminophen level     Status: Abnormal   Collection Time: 01/31/22  3:14 AM  Result Value Ref Range   Acetaminophen (Tylenol), Serum <10 (L) 10 - 30 ug/mL    Comment: (NOTE) Therapeutic concentrations vary significantly. A range of 10-30 ug/mL  may be an effective concentration for many patients. However, some  are best treated at concentrations outside of this range. Acetaminophen concentrations >150 ug/mL at 4 hours after ingestion  and >50 ug/mL at 12 hours after ingestion are often associated with  toxic reactions.  Performed at [facility], [place] [address] [place], [place] [address]   Salicylate level     Status: Abnormal   Collection Time: 01/31/22  3:14 AM  Result Value Ref Range   Salicylate Lvl <6.5 (L) 7.0 - 30.0 mg/dL    Comment: Performed at [facility] [address] [place], [place] [address]  I-Stat beta hCG blood, ED     Status: None   Collection Time: 01/31/22  3:20 AM  Result Value Ref Range   I-stat hCG, quantitative <5.0 <5 mIU/mL   Comment 3            Comment:   GEST. AGE      CONC.  (mIU/mL)   <=1 WEEK        5 - 50     2 WEEKS       50 - 500     3 WEEKS       100 - 10,000     4 WEEKS     1,000 - 30,000        FEMALE AND NON-PREGNANT FEMALE:     LESS THAN 5 mIU/mL   Urine rapid drug screen (hosp performed)     Status: Abnormal    Collection Time: 01/31/22 11:04 AM  Result Value Ref Range   Opiates NONE DETECTED NONE DETECTED   Cocaine NONE DETECTED NONE DETECTED   Benzodiazepines NONE DETECTED NONE DETECTED   Amphetamines NONE DETECTED NONE DETECTED   Tetrahydrocannabinol POSITIVE (A) NONE DETECTED   Barbiturates NONE DETECTED NONE DETECTED    Comment: (NOTE) DRUG SCREEN FOR MEDICAL PURPOSES ONLY.  IF CONFIRMATION IS NEEDED FOR ANY PURPOSE, NOTIFY LAB WITHIN 5 DAYS.  LOWEST DETECTABLE LIMITS FOR URINE DRUG SCREEN Drug Class                     Cutoff (ng/mL) Amphetamine and metabolites    1000 Barbiturate and metabolites    200 Benzodiazepine                 568 Tricyclics and metabolites     300  Opiates and metabolites        300 Cocaine and metabolites        300 THC                            50 Performed at [facility], [place] [address] [place], [place] [address]   Urinalysis, Routine w reflex microscopic Urine, In & Out Cath     Status: Abnormal   Collection Time: 01/31/22 11:04 AM  Result Value Ref Range   Color, Urine YELLOW YELLOW   APPearance CLEAR CLEAR   Specific Gravity, Urine 1.039 (H) 1.005 - 1.030   pH 6.0 5.0 - 8.0   Glucose, UA >=500 (A) NEGATIVE mg/dL   Hgb urine dipstick NEGATIVE NEGATIVE   Bilirubin Urine NEGATIVE NEGATIVE   Ketones, ur 80 (A) NEGATIVE mg/dL   Protein, ur NEGATIVE NEGATIVE mg/dL   Nitrite NEGATIVE NEGATIVE   Leukocytes,Ua NEGATIVE NEGATIVE   RBC / HPF 0-5 0 - 5 RBC/hpf   WBC, UA 0-5 0 - 5 WBC/hpf   Bacteria, UA NONE SEEN NONE SEEN    Comment: Performed at [facility] [address] [place], [place] [address]    Current Facility-Administered Medications  Medication Dose Route Frequency Provider Last Rate Last Admin   atorvastatin (LIPITOR) tablet 80 mg  80 mg Oral Daily Long, Wonda Olds, MD       divalproex (DEPAKOTE) DR tablet 500 mg  500 mg Oral BID Long, Wonda Olds, MD       insulin aspart (novoLOG) injection 0-9 Units  0-9 Units  Subcutaneous TID WC Long, Wonda Olds, MD       lisinopril (ZESTRIL) tablet 5 mg  5 mg Oral Daily Long, Wonda Olds, MD       QUEtiapine (SEROQUEL) tablet 50 mg  50 mg Oral QHS Vesta Mixer, NP       risperiDONE (RISPERDAL M-TABS) disintegrating tablet 1 mg  1 mg Oral QHS Vesta Mixer, NP       Current Outpatient Medications  Medication Sig Dispense Refill   ARIPiprazole (ABILIFY) 10 MG tablet Take 10 mg by mouth daily.     atorvastatin (LIPITOR) 80 MG tablet Take 1 tablet (80 mg total) by mouth daily. Elevated cholesterol 30 tablet 0   dapagliflozin propanediol (FARXIGA) 10 MG TABS tablet Take 10 mg by mouth daily.     divalproex (DEPAKOTE) 500 MG DR tablet Take 1 tablet (500 mg total) by mouth 2 (two) times daily. Mood disorder 30 tablet 0   hydrOXYzine (ATARAX) 25 MG tablet Take 1 tablet (25 mg total) by mouth 3 (three) times daily as needed for anxiety. For anxiety 30 tablet 0   lisinopril (ZESTRIL) 5 MG tablet Take 1 tablet (5 mg total) by mouth daily. For high blood pressure 30 tablet 0   metFORMIN (GLUCOPHAGE) 1000 MG tablet Take 1 tablet (1,000 mg total) by mouth 2 (two) times daily with a meal. diabetes 60 tablet 0   traZODone (DESYREL) 50 MG tablet Take 1 tablet (50 mg total) by mouth at bedtime as needed for sleep. For sleep 30 tablet 0   VICTOZA 18 MG/3ML SOPN Inject 1.8 mg into the skin daily.     Accu-Chek Softclix Lancets lancets Use to check FSBS BID. Dx: E11.42 100 each 2   Accu-Chek Softclix Lancets lancets Use as directed. 100 each 0   benztropine (COGENTIN) 0.5 MG tablet Take 1 tablet (0.5 mg total) by  mouth 2 (two) times daily. tremors (Patient not taking: Reported on 01/31/2022) 60 tablet 0   Blood Glucose Monitoring Suppl (ACCU-CHEK GUIDE ME) w/Device KIT Use to check FSBS BID. Dx: E11.42 1 kit 0   docusate sodium (COLACE) 100 MG capsule Take 1 capsule (100 mg total) by mouth daily. Stool softener (Patient not taking: Reported on 01/31/2022) 10 capsule 0   ferrous sulfate  325 (65 FE) MG tablet Take 1 tablet (325 mg total) by mouth 2 (two) times daily with a meal. Iron supplement (Patient not taking: Reported on 01/31/2022) 30 tablet 0   glucose blood (TRUE METRIX BLOOD GLUCOSE TEST) test strip Use as instructed 100 each 12   insulin aspart (NOVOLOG) 100 UNIT/ML injection Inject 0-9 Units into the skin 3 (three) times daily with meals. For elevated blood glucose (Patient not taking: Reported on 01/31/2022) 10 mL 0   insulin glargine-yfgn (SEMGLEE) 100 UNIT/ML injection Inject 0.16 mLs (16 Units total) into the skin daily. High blood glucose (Patient not taking: Reported on 01/31/2022) 10 mL 0   LORazepam (ATIVAN) 0.5 MG tablet Take 1 tablet (0.5 mg total) by mouth 2 (two) times daily. For anxiety (Patient not taking: Reported on 01/31/2022) 60 tablet 0   propranolol (INDERAL) 10 MG tablet Take 1 tablet (10 mg total) by mouth 2 (two) times daily. For anxiety and hypertension (Patient not taking: Reported on 01/31/2022) 30 tablet 0   QUEtiapine (SEROQUEL) 50 MG tablet Take 1 tablet (50 mg total) by mouth at bedtime. For mood disorder (Patient not taking: Reported on 01/31/2022) 30 tablet 0   risperiDONE (RISPERDAL M-TABS) 3 MG disintegrating tablet Take 1 tablet (3 mg total) by mouth daily. Mood disorder. (Patient not taking: Reported on 01/31/2022) 30 tablet 0   risperiDONE (RISPERDAL M-TABS) 4 MG disintegrating tablet Take 1 tablet (4 mg total) by mouth at bedtime. For mood disorder (Patient not taking: Reported on 01/31/2022) 30 tablet 0   TRUEplus Lancets 28G MISC Use as directed 100 each 1   Vitamin D3 (VITAMIN D) 25 MCG tablet Take 1 tablet (1,000 Units total) by mouth daily. Vitamin D supplement (Patient not taking: Reported on 01/31/2022) 60 tablet 0    Psychiatric Specialty Exam: Presentation  General Appearance: Appropriate for Environment  Eye Contact:Fair  Speech:Slow; Blocked  Speech Volume:Decreased  Handedness:Right   Mood and Affect   Mood:Depressed  Affect:Flat   Thought Process  Thought Processes:Disorganized  Descriptions of Associations:Loose  Orientation:Partial  Thought Content:Illogical; Tangential  History of Schizophrenia/Schizoaffective disorder:Yes  Duration of Psychotic Symptoms:Greater than six months  Hallucinations:Hallucinations: None  Ideas of Reference:None  Suicidal Thoughts:Suicidal Thoughts: No  Homicidal Thoughts:Homicidal Thoughts: No   Sensorium  Memory:Immediate Poor  Judgment:Impaired  Insight:Poor   Executive Functions  Concentration:Poor  Attention Span:Poor  Recall:Poor  Fund of Knowledge:Poor  Language:Poor   Psychomotor Activity  Psychomotor Activity:Psychomotor Activity: Normal   Assets  Assets:Physical Health; Resilience; Social Support    Sleep  Sleep:Sleep: Fair   Physical Exam: Physical Exam Neurological:     Mental Status: She is alert. She is disoriented.  Psychiatric:        Behavior: Behavior is slowed.    Review of Systems  Psychiatric/Behavioral:         Disoriented, disorganized thought process, thought blocking   Blood pressure (!) 144/84, pulse 92, temperature 98.2 F (36.8 C), temperature source Oral, resp. rate 16, height '5\' 11"'  (1.803 m), weight 132.5 kg, SpO2 99 %. Body mass index is 40.74 kg/m.  Medical Decision Making:  Patient case reviewed and discussed with Dr. Leverne Humbles.  At this time will recommend patient for inpatient psychiatric treatment.  If no availability at [facility] patient will be faxed out.  EDP, RN, LCSW notified of disposition.  Problem 1: Psychosis - Depakote 500 mg BID -Seroquel 50 mg Qhs -Risperidone 1 mg Qhs   Disposition: Recommend psychiatric Inpatient admission when medically cleared.  Vesta Mixer, NP 01/31/2022 2:47 PM

## 2022-01-31 NOTE — ED Notes (Signed)
Able to hear pt crying and whining at this time. Attempted to get pt to the restroom with assistance but unable to. Attempted to get pt back to bed and she stood there crying and whining with NT and this RN at this time

## 2022-01-31 NOTE — ED Notes (Signed)
Dr. Cardama at bedside.  

## 2022-01-31 NOTE — ED Notes (Signed)
Pt ambulated to restroom with assistance, crying and whinnying. Once finished pt returned to room. Attempted to complete assessment however pt remains silent, will make facial grimacing and moaning but will not answer questions
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
## 2022-01-31 NOTE — ED Notes (Signed)
Pt up standing at the door crying and moaning at this time. Still cannot understand what she is saying. NT is at door trying to redirect pt back to bed or to restroom if needed at this time

## 2022-01-31 NOTE — ED Notes (Signed)
Pt is crying and moaning loudly at this time

## 2022-01-31 NOTE — ED Notes (Signed)
Pt is laying in bed crying, whining, and moaning loud enough to hear her at the nurses station

## 2022-01-31 NOTE — ED Provider Notes (Signed)
Blood pressure (!) 138/90, pulse 89, temperature 99 F (37.2 C), temperature source Oral, resp. rate 16, height '5\' 11"'$  (1.803 m), weight 132.5 kg, SpO2 99 %.  Assuming care from Dr. Leonette Monarch.  In short, [name] is a 46 y.o. female with a chief complaint of Altered Mental Status, Tachycardia, and Hallucinations .  Refer to the original H&P for additional details.  The current plan of care is to follow up on UA and reassess.  12:45 PM UDS positive for THC.  UA not consistent with infection.  Patient with known psychiatric history.  Plan for TTS evaluation.  She is medically clear for psychiatry evaluation. Will need med rec and can order home medications at that time.     Margette Fast, MD 01/31/22 1247

## 2022-01-31 NOTE — ED Notes (Signed)
Phone locked up in security
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
## 2022-01-31 NOTE — ED Notes (Signed)
Received verbal report from Lysbeth Galas at this time

## 2022-01-31 NOTE — ED Notes (Signed)
Pt up ambulating to restroom with NT assistance at this time

## 2022-01-31 NOTE — ED Notes (Signed)
Pt up to doorway when tried to determine if she needed to go to the restroom and asking pt what she needed pt wouldn't respond verbally. Pt redirected back to bed and educated that it was night time and time to go to bed

## 2022-01-31 NOTE — Progress Notes (Signed)
CSW requested [facility] Wynonia Hazard, RN to review for [facility]. CSW will assist and follow with placement.  Benjaman Kindler, MSW, [facility] 01/31/2022 8:23 PM

## 2022-01-31 NOTE — ED Notes (Signed)
Pt tearful at this time.

## 2022-02-01 LAB — CBG MONITORING, ED
Glucose-Capillary: 188 mg/dL — ABNORMAL HIGH (ref 70–99)
Glucose-Capillary: 182 mg/dL — ABNORMAL HIGH (ref 70–99)

## 2022-02-01 MED ORDER — LORAZEPAM 2 MG/ML IJ SOLN
1.0000 mg | Freq: Three times a day (TID) | INTRAMUSCULAR | Status: DC | PRN
  Administered 2022-02-01: 1 mg via INTRAMUSCULAR
  Filled 2022-02-01: qty 1

## 2022-02-01 MED ORDER — LORAZEPAM 1 MG PO TABS: 1.0000 mg | ORAL_TABLET | Freq: Three times a day (TID) | ORAL | Status: DC | PRN

## 2022-02-01 NOTE — Progress Notes (Signed)
Inpatient Behavioral Health Placement  Pt meets inpatient criteria per Merlyn Lot, NP. There are no available beds at [facility] per [facility] Lynnda Shields, RN. Referral was sent to the following facilities;    Destination Service Provider Address Phone Fax  [facility]  [address] [place] [address] [phone] [facility]  [place], [place] [address] [place]  [facility]  [address] [place] [address] [phone]  [facility]  [place], [place] [address] [phone]  [facility]  [address] [place] [address] [place]  [facility]  [address] [place] [address] [phone]  [facility]  [address], [place] [address] [phone]  [facility]  [address] [place] [address] [phone] [place]  [address], [place] [address] [phone] [facility]  [address] [place] [address] [phone] [place]  [address] [place] [address] [phone] [facility]  [address] [place] [address] [phone] [facility]  [address] [place] [address] [phone] [place]  [place], [place] [address] [place]  [facility]  [address], [place] [address] [phone]   Situation ongoing,  CSW will follow up.   Benjaman Kindler, MSW, LCSWA 02/01/2022  @ 1:26 PM
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
## 2022-02-01 NOTE — Inpatient Diabetes Management (Signed)
Inpatient Diabetes Program Recommendations  AACE/ADA: New Consensus Statement on Inpatient Glycemic Control (2015)  Target Ranges:  Prepandial:   less than 140 mg/dL      Peak postprandial:   less than 180 mg/dL (1-2 hours)      Critically ill patients:  140 - 180 mg/dL   Lab Results  Component Value Date   GLUCAP 182 (H) 02/01/2022   HGBA1C 13.6 (H) 01/31/2022    Review of Glycemic Control  Diabetes history: DM2 Outpatient Diabetes medications: Farxiga 10 mg, Metformin 1 gm bid, Victoza 1.8 mg qd (not taking Semglee and Novolog) Current orders for Inpatient glycemic control: Novolog 0-9 tid  Inpatient Diabetes Program Recommendations:   Patient currently in ED.  Please consider if CBGs elevate > 180: -Semglee 20 units qd (0.15 units/kg x 132.5 kg)  Will plan to speak with patient during hospitalization.  Thank you, Nani Gasser. Sreekar Broyhill, RN, MSN, CDE  Diabetes Coordinator Inpatient Glycemic Control Team Team Pager [phone] (8am-5pm) 02/01/2022 3:03 PM

## 2022-02-01 NOTE — ED Notes (Signed)
Ms. Guinther, states that she isn't able to tune out patient that has not stopped talking and is causing a disturbance in the unit. She request medications at this time.

## 2022-02-01 NOTE — ED Notes (Signed)
Pt up and ambulated to restroom at this time without assistance

## 2022-02-01 NOTE — ED Notes (Signed)
Verbal report given to Idelle Jo RN at this time

## 2022-02-01 NOTE — ED Provider Notes (Signed)
Emergency Medicine Observation Re-evaluation Note  [name] is a 46 y.o. female, seen on rounds today.  Pt initially presented to the ED for complaints of Altered Mental Status, Tachycardia, and Hallucinations Currently, the patient is sleeping.  Physical Exam  BP 133/74 (BP Location: Right Arm)   Pulse 89   Temp 97.9 F (36.6 C) (Oral)   Resp 16   Ht '5\' 11"'$  (1.803 m)   Wt 132.5 kg   SpO2 100%   BMI 40.74 kg/m  Physical Exam General: Calm Cardiac: Regular rate and rhythm Lungs: No increased work of breathing Psych: Calm  ED Course / MDM  EKG:EKG Interpretation  Date/Time:  Tuesday January 31 2022 06:45:29 EDT Ventricular Rate:  90 PR Interval:  151 QRS Duration: 88 QT Interval:  359 QTC Calculation: 440 R Axis:   -4 Text Interpretation: Sinus rhythm lead reversal corrected. Otherwise no significant change Confirmed by Addison Lank [phone]) on 01/31/2022 7:28:58 AM  I have reviewed the labs performed to date as well as medications administered while in observation.  Recent changes in the last 24 hours include no notable changes.  Plan  Current plan is for behavioral health evaluation.  [name] is not under involuntary commitment.     Carmin Muskrat, MD 02/01/22 [phone]

## 2022-02-01 NOTE — Progress Notes (Signed)
[name] with Intake from [facility] advised that pt will be presented to their provider for review. CSW will assist and follow with placement.   Benjaman Kindler, MSW, LCSWA 02/01/2022 2:02 PM

## 2022-02-01 NOTE — ED Notes (Signed)
Ms. Mullally, is sitting in a chair and started talking about why she is here. She's been talking for 1 hour to this nurse and NT.

## 2022-02-01 NOTE — ED Notes (Signed)
Sitter remains outside of room with line of sight. Pt is resting quietly at this time with NAD noted, resp equal and non labored at this time

## 2022-02-01 NOTE — ED Notes (Signed)
Pt all of a sudden come running out of her room. Ran to the exit door that goes to triage. Tried multiple times to get out through that door. Pt then ambulated to restroom. Noted pt had a diaper on and her pants were wet. Pt pulled diaper and pants off while sitting on the toilet. Pt urinated on the floor and on the toilet paper roll in the bathroom. Pt provided wash clothes to clean herself, one NT cleaned and changed her sheets while this RN and a second NT assisted pt in the bathroom. New diaper placed on pt and provided pt with clean scrubs at this time. Pt had to be assisted back to her room. While attempting to escort her back to the room pt tried to go for the door that goes to triage again. Pt was redirected back to her room and back to her bed. While the NT was with her in the room per NT pt whispered to her "why are we doing this to her".

## 2022-02-01 NOTE — Progress Notes (Signed)
Pt was accepted to [facility] 02/01/22; Bed Assignment Delta Unit   Pt meets inpatient criteria per Merlyn Lot, NP  Attending Physician will be Cammie Mcgee, NP  Report can be called to: [phone]  Pt can arrive: Now bed is ready  Care Team notified: [facility] Lynnda Shields, RN, Merlyn Lot, NP, and Lynnda Shields, RN   Nadara Mode, Wann 02/01/2022 @ 2:12 PM

## 2022-02-01 NOTE — ED Notes (Signed)
Pt transported to [facility]; Bed Assignment Delta Unit Attending: Cammie Mcgee NP  Report: [phone]  Spoke to Sharyn Lull, RN  Pt escorted to the [employer] and belongings placed in [place] with patient. Her cell phone, clothing and medications.

## 2022-02-01 NOTE — ED Notes (Signed)
Ms. [name] opened up her Patient Valuable bag with her phone in it in front of the nurses station. She was able to get phone numbers off her cell phone and write them down. She handed her cell phone back to nurse and it was placed back into the valuable bag with tape on it. And my signature. In front of Ms. [name].

## 2022-02-15 ENCOUNTER — Other Ambulatory Visit: Payer: Self-pay

## 2022-02-15 ENCOUNTER — Emergency Department (HOSPITAL_COMMUNITY)
Admission: EM | Admit: 2022-02-15 | Discharge: 2022-02-17 | Disposition: A | Discharge status: Home / Self Care | Payer: Medicaid Other | Attending: Emergency Medicine | Admitting: Emergency Medicine

## 2022-02-15 ENCOUNTER — Encounter (HOSPITAL_COMMUNITY): Payer: Self-pay

## 2022-02-15 DIAGNOSIS — F250 Schizoaffective disorder, bipolar type: Secondary | ICD-10-CM | POA: Diagnosis not present

## 2022-02-15 DIAGNOSIS — F319 Bipolar disorder, unspecified: Secondary | ICD-10-CM | POA: Diagnosis present

## 2022-02-15 DIAGNOSIS — F29 Unspecified psychosis not due to a substance or known physiological condition: Secondary | ICD-10-CM | POA: Insufficient documentation

## 2022-02-15 DIAGNOSIS — Z794 Long term (current) use of insulin: Secondary | ICD-10-CM | POA: Diagnosis not present

## 2022-02-15 DIAGNOSIS — Z7984 Long term (current) use of oral hypoglycemic drugs: Secondary | ICD-10-CM | POA: Diagnosis not present

## 2022-02-15 DIAGNOSIS — F419 Anxiety disorder, unspecified: Secondary | ICD-10-CM | POA: Diagnosis not present

## 2022-02-15 DIAGNOSIS — Z20822 Contact with and (suspected) exposure to covid-19: Secondary | ICD-10-CM | POA: Diagnosis not present

## 2022-02-15 LAB — CBC WITH DIFFERENTIAL/PLATELET
Abs Immature Granulocytes: 0.02 10*3/uL (ref 0.00–0.07)
Basophils Absolute: 0.1 10*3/uL (ref 0.0–0.1)
Basophils Relative: 1 %
Eosinophils Absolute: 0.1 10*3/uL (ref 0.0–0.5)
Eosinophils Relative: 1 %
HCT: 33.6 % — ABNORMAL LOW (ref 36.0–46.0)
Hemoglobin: 10.3 g/dL — ABNORMAL LOW (ref 12.0–15.0)
Immature Granulocytes: 0 %
Lymphocytes Relative: 15 %
Lymphs Abs: 1.4 10*3/uL (ref 0.7–4.0)
MCH: 23.3 pg — ABNORMAL LOW (ref 26.0–34.0)
MCHC: 30.7 g/dL (ref 30.0–36.0)
MCV: 75.8 fL — ABNORMAL LOW (ref 80.0–100.0)
Monocytes Absolute: 0.7 10*3/uL (ref 0.1–1.0)
Monocytes Relative: 7 %
Neutro Abs: 7.3 10*3/uL (ref 1.7–7.7)
Neutrophils Relative %: 76 %
Platelets: 344 10*3/uL (ref 150–400)
RBC: 4.43 MIL/uL (ref 3.87–5.11)
RDW: 17.2 % — ABNORMAL HIGH (ref 11.5–15.5)
WBC: 9.5 10*3/uL (ref 4.0–10.5)
nRBC: 0 % (ref 0.0–0.2)

## 2022-02-15 LAB — COMPREHENSIVE METABOLIC PANEL WITH GFR
ALT: 21 U/L (ref 0–44)
AST: 18 U/L (ref 15–41)
Albumin: 3.6 g/dL (ref 3.5–5.0)
Alkaline Phosphatase: 62 U/L (ref 38–126)
Anion gap: 7 (ref 5–15)
BUN: 7 mg/dL (ref 6–20)
CO2: 23 mmol/L (ref 22–32)
Calcium: 9.2 mg/dL (ref 8.9–10.3)
Chloride: 108 mmol/L (ref 98–111)
Creatinine, Ser: 0.63 mg/dL (ref 0.44–1.00)
GFR, Estimated: >60 mL/min
Glucose, Bld: 177 mg/dL — ABNORMAL HIGH (ref 70–99)
Potassium: 3.7 mmol/L (ref 3.5–5.1)
Sodium: 138 mmol/L (ref 135–145)
Total Bilirubin: 0.8 mg/dL (ref 0.3–1.2)
Total Protein: 7.2 g/dL (ref 6.5–8.1)

## 2022-02-15 LAB — ETHANOL: Alcohol, Ethyl (B): <10 mg/dL (ref <10)

## 2022-02-15 LAB — I-STAT BETA HCG BLOOD, ED (MC, WL, AP ONLY): I-stat hCG, quantitative: <5 m[IU]/mL (ref <5)

## 2022-02-15 NOTE — ED Triage Notes (Signed)
Pt signed out of psych facility yesterday that she was voluntary at. Pt reports being scared and having AH.  Pt reports being off meds.  Hx schizo and bipolar.  Denies SI/HI

## 2022-02-15 NOTE — ED Provider Notes (Signed)
[facility] DEPT Provider Note   CSN: [id number] Arrival date & time: 02/15/22  2212     History  Chief Complaint  Patient presents with   Psychiatric Evaluation    [name] is a 46 y.o. female who presents emergency department brought in by EMS for psychosis.  Patient is unable to give any history except to state that she thinks people are trying to kill her.  According to EMS patient lives with her mother and apparently was inpatient at a psychiatric facility but signed herself out [place].  She is apparently also not taking her medications.  Patient does not appear to be under involuntary commitment.  HPI     Home Medications Prior to Admission medications   Medication Sig Start Date End Date Taking? Authorizing Provider  Accu-Chek Softclix Lancets lancets Use to check FSBS BID. Dx: E11.42 03/11/20   [name] Bang, MD  Accu-Chek Softclix Lancets lancets Use as directed. 06/01/21   Nicholes Rough, NP  ARIPiprazole (ABILIFY) 10 MG tablet Take 10 mg by mouth daily. 01/29/22   [provider]  atorvastatin (LIPITOR) 80 MG tablet Take 1 tablet (80 mg total) by mouth daily. Elevated cholesterol 06/01/21 06/01/22  Nicholes Rough, NP  benztropine (COGENTIN) 0.5 MG tablet Take 1 tablet (0.5 mg total) by mouth 2 (two) times daily. tremors Patient not taking: Reported on 01/31/2022 06/01/21   Nicholes Rough, NP  Blood Glucose Monitoring Suppl (ACCU-CHEK GUIDE ME) w/Device KIT Use to check FSBS BID. Dx: E11.42 03/11/20   [name] Bang, MD  dapagliflozin propanediol (FARXIGA) 10 MG TABS tablet Take 10 mg by mouth daily.    [provider]  divalproex (DEPAKOTE) 500 MG DR tablet Take 1 tablet (500 mg total) by mouth 2 (two) times daily. Mood disorder 06/01/21   Nicholes Rough, NP  docusate sodium (COLACE) 100 MG capsule Take 1 capsule (100 mg total) by mouth daily. Stool softener Patient not taking: Reported on  01/31/2022 06/02/21   Nicholes Rough, NP  ferrous sulfate 325 (65 FE) MG tablet Take 1 tablet (325 mg total) by mouth 2 (two) times daily with a meal. Iron supplement Patient not taking: Reported on 01/31/2022 06/01/21   Nicholes Rough, NP  glucose blood (TRUE METRIX BLOOD GLUCOSE TEST) test strip Use as instructed 03/09/21   Camillia Herter, NP  hydrOXYzine (ATARAX) 25 MG tablet Take 1 tablet (25 mg total) by mouth 3 (three) times daily as needed for anxiety. For anxiety 06/01/21   Nicholes Rough, NP  insulin aspart (NOVOLOG) 100 UNIT/ML injection Inject 0-9 Units into the skin 3 (three) times daily with meals. For elevated blood glucose Patient not taking: Reported on 01/31/2022 06/01/21   Nicholes Rough, NP  insulin glargine-yfgn (SEMGLEE) 100 UNIT/ML injection Inject 0.16 mLs (16 Units total) into the skin daily. High blood glucose Patient not taking: Reported on 01/31/2022 06/01/21   Nicholes Rough, NP  lisinopril (ZESTRIL) 5 MG tablet Take 1 tablet (5 mg total) by mouth daily. For high blood pressure 06/01/21 06/01/22  Nicholes Rough, NP  LORazepam (ATIVAN) 0.5 MG tablet Take 1 tablet (0.5 mg total) by mouth 2 (two) times daily. For anxiety Patient not taking: Reported on 01/31/2022 06/01/21   Nicholes Rough, NP  metFORMIN (GLUCOPHAGE) 1000 MG tablet Take 1 tablet (1,000 mg total) by mouth 2 (two) times daily with a meal. diabetes 06/01/21 06/01/22  Nicholes Rough, NP  propranolol (INDERAL) 10 MG tablet Take 1 tablet (10 mg total) by mouth  2 (two) times daily. For anxiety and hypertension Patient not taking: Reported on 01/31/2022 06/01/21   Nicholes Rough, NP  QUEtiapine (SEROQUEL) 50 MG tablet Take 1 tablet (50 mg total) by mouth at bedtime. For mood disorder Patient not taking: Reported on 01/31/2022 06/01/21   Nicholes Rough, NP  risperiDONE (RISPERDAL M-TABS) 3 MG disintegrating tablet Take 1 tablet (3 mg total) by mouth daily. Mood disorder. Patient not taking: Reported on 01/31/2022 06/02/21   Nicholes Rough, NP  risperiDONE (RISPERDAL M-TABS) 4 MG disintegrating tablet Take 1 tablet (4 mg total) by mouth at bedtime. For mood disorder Patient not taking: Reported on 01/31/2022 06/01/21   Nicholes Rough, NP  traZODone (DESYREL) 50 MG tablet Take 1 tablet (50 mg total) by mouth at bedtime as needed for sleep. For sleep 06/01/21   Nicholes Rough, NP  TRUEplus Lancets 28G MISC Use as directed 03/09/21   Camillia Herter, NP  VICTOZA 18 MG/3ML SOPN Inject 1.8 mg into the skin daily. 11/25/21   [provider]  Vitamin D3 (VITAMIN D) 25 MCG tablet Take 1 tablet (1,000 Units total) by mouth daily. Vitamin D supplement Patient not taking: Reported on 01/31/2022 06/02/21   Nicholes Rough, NP      Allergies    Patient has no known allergies.    Review of Systems   Review of Systems  Physical Exam Updated Vital Signs BP (!) 139/101 (BP Location: Right Arm)   Pulse 97   Temp 98.5 F (36.9 C) (Oral)   Resp 18   Ht '5\' 11"'  (1.803 m)   SpO2 96%   BMI 40.74 kg/m  Physical Exam Vitals and nursing note reviewed.  Constitutional:      General: She is not in acute distress.    Appearance: She is well-developed. She is not diaphoretic.  HENT:     Head: Normocephalic and atraumatic.     Right Ear: External ear normal.     Left Ear: External ear normal.     Nose: Nose normal.     Mouth/Throat:     Mouth: Mucous membranes are moist.  Eyes:     General: No scleral icterus.    Extraocular Movements: Extraocular movements intact.     Conjunctiva/sclera: Conjunctivae normal.     Pupils: Pupils are equal, round, and reactive to light.  Cardiovascular:     Rate and Rhythm: Normal rate and regular rhythm.     Heart sounds: Normal heart sounds. No murmur heard.    No friction rub. No gallop.  Pulmonary:     Effort: Pulmonary effort is normal. No respiratory distress.     Breath sounds: Normal breath sounds.  Abdominal:     General: Bowel sounds are normal. There is no distension.      Palpations: Abdomen is soft. There is no mass.     Tenderness: There is no abdominal tenderness. There is no guarding.  Musculoskeletal:     Cervical back: Normal range of motion.  Skin:    General: Skin is warm and dry.  Neurological:     Mental Status: She is alert.  Psychiatric:        Attention and Perception: She is inattentive.        Mood and Affect: Mood is anxious.        Speech: She is noncommunicative.        Behavior: Behavior is slowed and withdrawn.        Thought Content: Thought content is paranoid.  Comments: Patient with wide eyed stare Tacit[place] Appears afraid Not following commands     ED Results / Procedures / Treatments   Labs (all labs ordered are listed, but only abnormal results are displayed) Labs Reviewed  RESP PANEL BY RT-PCR (FLU A&B, COVID) ARPGX2  COMPREHENSIVE METABOLIC PANEL  ETHANOL  RAPID URINE DRUG SCREEN, HOSP PERFORMED  CBC WITH DIFFERENTIAL/PLATELET  I-STAT BETA HCG BLOOD, ED ([facility], [facility], [facility] ONLY)    EKG None  Radiology No results found.  Procedures Procedures    Medications Ordered in ED Medications - No data to display  ED Course/ Medical Decision Making/ A&P                           Medical Decision Making Amount and/or Complexity of Data Reviewed Labs: ordered.  Risk Prescription drug management.     MEDICALLY CLEAR  labs reassuring         Final Clinical Impression(s) / ED Diagnoses Final diagnoses:  None    Rx / DC Orders ED Discharge Orders     None         Margarita Mail, PA-C 02/16/22 [place], April, MD 02/16/22 2317

## 2022-02-15 NOTE — ED Provider Notes (Incomplete)
[facility] DEPT Provider Note   CSN: [id number] Arrival date & time: 02/15/22  2212     History {Add pertinent medical, surgical, social history, OB history to HPI:1} Chief Complaint  Patient presents with  . Psychiatric Evaluation    [name] is a 46 y.o. female who presents emergency department brought in by EMS for psychosis.  Patient is unable to give any history except to state that she thinks people are trying to kill her.  According to EMS patient lives with her mother and apparently was inpatient at a psychiatric facility but signed herself out [place].  She is apparently also not taking her medications.  Patient does not appear to be under involuntary commitment.  HPI     Home Medications Prior to Admission medications   Medication Sig Start Date End Date Taking? Authorizing Provider  Accu-Chek Softclix Lancets lancets Use to check FSBS BID. Dx: E11.42 03/11/20   [name] Bang, MD  Accu-Chek Softclix Lancets lancets Use as directed. 06/01/21   Nicholes Rough, NP  ARIPiprazole (ABILIFY) 10 MG tablet Take 10 mg by mouth daily. 01/29/22   [provider]  atorvastatin (LIPITOR) 80 MG tablet Take 1 tablet (80 mg total) by mouth daily. Elevated cholesterol 06/01/21 06/01/22  Nicholes Rough, NP  benztropine (COGENTIN) 0.5 MG tablet Take 1 tablet (0.5 mg total) by mouth 2 (two) times daily. tremors Patient not taking: Reported on 01/31/2022 06/01/21   Nicholes Rough, NP  Blood Glucose Monitoring Suppl (ACCU-CHEK GUIDE ME) w/Device KIT Use to check FSBS BID. Dx: E11.42 03/11/20   [name] Bang, MD  dapagliflozin propanediol (FARXIGA) 10 MG TABS tablet Take 10 mg by mouth daily.    [provider]  divalproex (DEPAKOTE) 500 MG DR tablet Take 1 tablet (500 mg total) by mouth 2 (two) times daily. Mood disorder 06/01/21   Nicholes Rough, NP  docusate sodium (COLACE) 100 MG capsule Take 1 capsule (100 mg  total) by mouth daily. Stool softener Patient not taking: Reported on 01/31/2022 06/02/21   Nicholes Rough, NP  ferrous sulfate 325 (65 FE) MG tablet Take 1 tablet (325 mg total) by mouth 2 (two) times daily with a meal. Iron supplement Patient not taking: Reported on 01/31/2022 06/01/21   Nicholes Rough, NP  glucose blood (TRUE METRIX BLOOD GLUCOSE TEST) test strip Use as instructed 03/09/21   Camillia Herter, NP  hydrOXYzine (ATARAX) 25 MG tablet Take 1 tablet (25 mg total) by mouth 3 (three) times daily as needed for anxiety. For anxiety 06/01/21   Nicholes Rough, NP  insulin aspart (NOVOLOG) 100 UNIT/ML injection Inject 0-9 Units into the skin 3 (three) times daily with meals. For elevated blood glucose Patient not taking: Reported on 01/31/2022 06/01/21   Nicholes Rough, NP  insulin glargine-yfgn (SEMGLEE) 100 UNIT/ML injection Inject 0.16 mLs (16 Units total) into the skin daily. High blood glucose Patient not taking: Reported on 01/31/2022 06/01/21   Nicholes Rough, NP  lisinopril (ZESTRIL) 5 MG tablet Take 1 tablet (5 mg total) by mouth daily. For high blood pressure 06/01/21 06/01/22  Nicholes Rough, NP  LORazepam (ATIVAN) 0.5 MG tablet Take 1 tablet (0.5 mg total) by mouth 2 (two) times daily. For anxiety Patient not taking: Reported on 01/31/2022 06/01/21   Nicholes Rough, NP  metFORMIN (GLUCOPHAGE) 1000 MG tablet Take 1 tablet (1,000 mg total) by mouth 2 (two) times daily with a meal. diabetes 06/01/21 06/01/22  Nicholes Rough, NP  propranolol (INDERAL) 10 MG  tablet Take 1 tablet (10 mg total) by mouth 2 (two) times daily. For anxiety and hypertension Patient not taking: Reported on 01/31/2022 06/01/21   Nicholes Rough, NP  QUEtiapine (SEROQUEL) 50 MG tablet Take 1 tablet (50 mg total) by mouth at bedtime. For mood disorder Patient not taking: Reported on 01/31/2022 06/01/21   Nicholes Rough, NP  risperiDONE (RISPERDAL M-TABS) 3 MG disintegrating tablet Take 1 tablet (3 mg total) by mouth daily. Mood  disorder. Patient not taking: Reported on 01/31/2022 06/02/21   Nicholes Rough, NP  risperiDONE (RISPERDAL M-TABS) 4 MG disintegrating tablet Take 1 tablet (4 mg total) by mouth at bedtime. For mood disorder Patient not taking: Reported on 01/31/2022 06/01/21   Nicholes Rough, NP  traZODone (DESYREL) 50 MG tablet Take 1 tablet (50 mg total) by mouth at bedtime as needed for sleep. For sleep 06/01/21   Nicholes Rough, NP  TRUEplus Lancets 28G MISC Use as directed 03/09/21   Camillia Herter, NP  VICTOZA 18 MG/3ML SOPN Inject 1.8 mg into the skin daily. 11/25/21   [provider]  Vitamin D3 (VITAMIN D) 25 MCG tablet Take 1 tablet (1,000 Units total) by mouth daily. Vitamin D supplement Patient not taking: Reported on 01/31/2022 06/02/21   Nicholes Rough, NP      Allergies    Patient has no known allergies.    Review of Systems   Review of Systems  Physical Exam Updated Vital Signs BP (!) 139/101 (BP Location: Right Arm)   Pulse 97   Temp 98.5 F (36.9 C) (Oral)   Resp 18   Ht _0  (1.803 m)   SpO2 96%   BMI 40.74 kg/m  Physical Exam Vitals and nursing note reviewed.  Constitutional:      General: She is not in acute distress.    Appearance: She is well-developed. She is not diaphoretic.  HENT:     Head: Normocephalic and atraumatic.     Right Ear: External ear normal.     Left Ear: External ear normal.     Nose: Nose normal.     Mouth/Throat:     Mouth: Mucous membranes are moist.  Eyes:     General: No scleral icterus.    Extraocular Movements: Extraocular movements intact.     Conjunctiva/sclera: Conjunctivae normal.     Pupils: Pupils are equal, round, and reactive to light.  Cardiovascular:     Rate and Rhythm: Normal rate and regular rhythm.     Heart sounds: Normal heart sounds. No murmur heard.    No friction rub. No gallop.  Pulmonary:     Effort: Pulmonary effort is normal. No respiratory distress.     Breath sounds: Normal breath sounds.  Abdominal:      General: Bowel sounds are normal. There is no distension.     Palpations: Abdomen is soft. There is no mass.     Tenderness: There is no abdominal tenderness. There is no guarding.  Musculoskeletal:     Cervical back: Normal range of motion.  Skin:    General: Skin is warm and dry.  Neurological:     Mental Status: She is alert.  Psychiatric:        Attention and Perception: She is inattentive.        Mood and Affect: Mood is anxious.        Speech: She is noncommunicative.        Behavior: Behavior is slowed and withdrawn.  Thought Content: Thought content is paranoid.     Comments: Patient with wide eyed stare [place]it[place] Appears afraid Not following commands     ED Results / Procedures / Treatments   Labs (all labs ordered are listed, but only abnormal results are displayed) Labs Reviewed  RESP PANEL BY RT-PCR (FLU A&B, COVID) ARPGX2  COMPREHENSIVE METABOLIC PANEL  ETHANOL  RAPID URINE DRUG SCREEN, HOSP PERFORMED  CBC WITH DIFFERENTIAL/PLATELET  I-STAT BETA HCG BLOOD, ED ([facility], [facility], [facility] ONLY)    EKG None  Radiology No results found.  Procedures Procedures  {Document cardiac monitor, telemetry assessment procedure when appropriate:1}  Medications Ordered in ED Medications - No data to display  ED Course/ Medical Decision Making/ A&P                           Medical Decision Making Amount and/or Complexity of Data Reviewed Labs: ordered.   ***  {Document critical care time when appropriate:1} {Document review of labs and clinical decision tools ie heart score, Chads2Vasc2 etc:1}  {Document your independent review of radiology images, and any outside records:1} {Document your discussion with family members, caretakers, and with consultants:1} {Document social determinants of health affecting pt's care:1} {Document your decision making why or why not admission, treatments were needed:1} Final Clinical Impression(s) / ED Diagnoses Final diagnoses:  None     Rx / DC Orders ED Discharge Orders     None

## 2022-02-16 ENCOUNTER — Encounter (HOSPITAL_COMMUNITY): Payer: Self-pay | Admitting: Emergency Medicine

## 2022-02-16 DIAGNOSIS — F250 Schizoaffective disorder, bipolar type: Secondary | ICD-10-CM | POA: Diagnosis not present

## 2022-02-16 LAB — RESP PANEL BY RT-PCR (FLU A&B, COVID) ARPGX2
Influenza A by PCR: NEGATIVE
Influenza B by PCR: NEGATIVE
SARS Coronavirus 2 by RT PCR: NEGATIVE

## 2022-02-16 LAB — URINALYSIS, ROUTINE W REFLEX MICROSCOPIC
Bilirubin Urine: NEGATIVE
Glucose, UA: 50 mg/dL — AB
Hgb urine dipstick: NEGATIVE
Ketones, ur: 5 mg/dL — AB
Leukocytes,Ua: NEGATIVE
Nitrite: NEGATIVE
Protein, ur: NEGATIVE mg/dL
Specific Gravity, Urine: 1.015 (ref 1.005–1.030)
pH: 6 (ref 5.0–8.0)

## 2022-02-16 MED ORDER — ACETAMINOPHEN 325 MG PO TABS
650.0000 mg | ORAL_TABLET | Freq: Four times a day (QID) | ORAL | Status: DC | PRN
  Administered 2022-02-16: 650 mg via ORAL
  Filled 2022-02-16: qty 2

## 2022-02-16 MED ORDER — ZIPRASIDONE MESYLATE 20 MG IM SOLR
20.0000 mg | Freq: Once | INTRAMUSCULAR | Status: AC
  Administered 2022-02-16: 20 mg via INTRAMUSCULAR
  Filled 2022-02-16: qty 20

## 2022-02-16 MED ORDER — LORAZEPAM 0.5 MG PO TABS: 0.5000 mg | ORAL_TABLET | Freq: Two times a day (BID) | ORAL | Status: DC

## 2022-02-16 MED ORDER — ARIPIPRAZOLE 10 MG PO TABS: 10.0000 mg | ORAL_TABLET | Freq: Every day | ORAL | Status: DC

## 2022-02-16 MED ORDER — ARIPIPRAZOLE 10 MG PO TABS
20.0000 mg | ORAL_TABLET | Freq: Every day | ORAL | Status: DC
  Administered 2022-02-16 – 2022-02-17 (×2): 20 mg via ORAL
  Filled 2022-02-16 (×2): qty 2

## 2022-02-16 MED ORDER — LORAZEPAM 0.5 MG PO TABS: 0.5000 mg | ORAL_TABLET | Freq: Two times a day (BID) | ORAL | Status: DC | PRN

## 2022-02-16 MED ORDER — STERILE WATER FOR INJECTION IJ SOLN
INTRAMUSCULAR | Status: AC
  Administered 2022-02-16: 1.2 mL via INTRAMUSCULAR
  Filled 2022-02-16: qty 10

## 2022-02-16 MED ORDER — BENZTROPINE MESYLATE 0.5 MG PO TABS
0.5000 mg | ORAL_TABLET | Freq: Two times a day (BID) | ORAL | Status: DC
  Administered 2022-02-16 – 2022-02-17 (×3): 0.5 mg via ORAL
  Filled 2022-02-16 (×3): qty 1

## 2022-02-16 MED ORDER — LORAZEPAM 1 MG PO TABS
1.0000 mg | ORAL_TABLET | Freq: Once | ORAL | Status: AC
  Administered 2022-02-16: 1 mg via ORAL
  Filled 2022-02-16: qty 1

## 2022-02-16 MED ORDER — ARIPIPRAZOLE 5 MG PO TABS: 15.0000 mg | ORAL_TABLET | Freq: Every day | ORAL | Status: DC

## 2022-02-16 NOTE — ED Notes (Incomplete)
Pt states she "need to pee". This writer attempted to assist pt out of bed. Pt unable to follow directions to get out of bed. When this writer asked pt if she needs to pee, pt had a blank stare and does not answer question. Pt will, also, change her wants. States she wants to pee and then will state she wants to eat.

## 2022-02-16 NOTE — ED Notes (Signed)
Pt tells sitter that she needs to go to BR.  This RN to bedside to assist pt.  Pt asks RN "What if I fall?"  Rn informs pt that she arrived to ER by herself and that she was walking prior to coming to ER.  RN informs pt that she is able to walk on her own.  Pt gets herself up from bed with much coaching from RN.  Pt attempts several times to take a step, but tries to sit back on bed.  This RN again gives verbal coaching to have pt walk forward.  Pt shuffles to BR and sits hard on toilet.  When pt is finished she calls for RN.  This RN again has to coach pt to stand from toilet. Pt stands and then leans forward.  RN touches pt's arm and she stops leaning and takes several steps forward.  Pt has to be encouraged to walk herself back to the bed.  Upon arriving at the bed, pt falls into bed.  This RN tells the pt that she will have to get herself up in the bed to a position of comfort.  Pt verbalizes understanding and pulls herself up in the bed some.  Will monitor.

## 2022-02-16 NOTE — ED Notes (Signed)
Pt is swinging her fists at staff and screaming, notified EDP

## 2022-02-16 NOTE — ED Notes (Signed)
Pt resting, respirations equal & unlabored.

## 2022-02-16 NOTE — BH Assessment (Signed)
Per Dr. Dwyane Dee in the 9am morning huddle/bed meeting, Provider to assess. No TTS CCA is needed at this time.

## 2022-02-16 NOTE — ED Notes (Signed)
Pt received breakfast tray 

## 2022-02-16 NOTE — ED Notes (Signed)
Attempted to change pt into burgundy scrubs, pt currently not being cooperative or even acknowledging staff direction. Will continue to monitor and attempt when pt is calmed down.

## 2022-02-16 NOTE — ED Notes (Signed)
Lunch tray given to pt. Pt crying. States "they always do this to me". When asked if she's hungry, pt cries and mumbles.

## 2022-02-16 NOTE — ED Notes (Signed)
Pt crying and mumbling to self. Unable to clearly understand everything she's saying. When asked what's wrong, she states she "wants to get out of this bed", "wants to get out", "he's a lunatic", "tell my family I'm sorry". Pt, also, states she has abdominal pain. RN aware. Will continue to monitor

## 2022-02-16 NOTE — Consult Note (Signed)
[facility] ED ASSESSMENT   Reason for Consult:  Psychosis Referring Physician:  Dene Gentry, MD Patient Identification: [name] MRN:  [id number] ED Chief Complaint: Schizoaffective disorder, bipolar type [facility])  Diagnosis:  Principal Problem:   Schizoaffective disorder, bipolar type [facility])   ED Assessment Time Calculation: Start Time: 1350 Stop Time: 1405 Total Time in Minutes (Assessment Completion): 15   Subjective:   [name] is a 46 y.o. female patient with a history of schizoaffective disorder-bipolar type who presents to [facility] via EMS for psychosis. Per EDP note, patient reported that people were trying to kill her.   HPI:   Patient evaluated twice today. Patient provides minimal responses. Speech is decreased in volume and slow. Responses she dose provide are delayed. She primarily stares toward provider when asked questions. Patient noted to be tearful at times and then laughing at times during the assessment. Patient becomes tearful when asked about AVH, and states "I don't know." She denies SI. She denies HI.   Patient encouraged to eat lunch, which was at bedside. She states "I know I need to, but I don't really have an appetite." This was the clearest response provided during the assessments.   Medications were reviewed with the patient's sister by pharmacy. It appears the most recently prescribed antipsychotic is Abilify 30 mg daily. Per dispense report, abilify 30 mg daily was dispensed on 02/14/2022.   Patient was evaluated at [facility] with similar presentation. She was transferred to [facility] for inpatient psychiatric treatment on 02/01/2022. Per notes, it appears the patient may have left [facility].   Past Psychiatric History: Schizoaffective Disorder-Bipolar Type. She was inpatient at [facility] 05/2021. At that time she was discharged on seroquel 50 mg QHS, risperidone 3 mg daily and 4 mg QHS, depakote 500 mg BID, and cogentin 0.5 mg BID.  Risk to  Self or Others: Is the patient at risk to self? Yes Has the patient been a risk to self in the past 6 months? Yes Has the patient been a risk to self within the distant past? Yes Is the patient a risk to others? No Has the patient been a risk to others in the past 6 months? No Has the patient been a risk to others within the distant past? No  [place] Scale:  [place] ED from 02/15/2022 in [facility] DEPT ED from 01/31/2022 in [facility] Admission (Discharged) from 05/16/2021 in [facility] 500B  C-SSRS RISK CATEGORY No Risk No Risk No Risk       AIMS:  , , ,  ,   ASAM:    Substance Abuse:     Past Medical History:  Past Medical History:  Diagnosis Date   Abnormal Pap smear    Bipolar 1 disorder ([place])    Diabetes mellitus without complication (HCC)    Fibroids    Hypertension    IBS (irritable bowel syndrome)     Past Surgical History:  Procedure Laterality Date   CERVICAL BIOPSY     CHOLECYSTECTOMY     ESSURE TUBAL LIGATION     HIATAL HERNIA REPAIR     TUBAL LIGATION     Family History:  Family History  Problem Relation Age of Onset   Diabetes Father    Diabetes Paternal Grandmother    Diabetes Maternal Grandmother    Mental illness Cousin    Healthy Mother     Social History:  Social History  Substance and Sexual Activity  Alcohol Use Not Currently   Comment: occasional     Social History   Substance and Sexual Activity  Drug Use Not Currently    Social History   Socioeconomic History   Marital status: Single    Spouse name: Not on file   Number of children: 2   Years of education: Not on file   Highest education level: Master's degree (e.g., MA, MS, MEng, MEd, MSW, MBA)  Occupational History   Not on file  Tobacco Use   Smoking status: Former    Types: Cigarettes   Smokeless tobacco: Never  Vaping Use   Vaping Use: Some days   Substances:  Nicotine, Flavoring  Substance and Sexual Activity   Alcohol use: Not Currently    Comment: occasional   Drug use: Not Currently   Sexual activity: Yes    Birth control/protection: Surgical  Other Topics Concern   Not on file  Social History Narrative   ** Merged History Encounter **       Social Determinants of Health   Financial Resource Strain: Not on file  Food Insecurity: Not on file  Transportation Needs: Not on file  Physical Activity: Not on file  Stress: Not on file  Social Connections: Not on file   Additional Social History:    Allergies:  No Known Allergies  Labs:  Results for orders placed or performed during the hospital encounter of 02/15/22 (from the past 48 hour(s))  Comprehensive metabolic panel     Status: Abnormal   Collection Time: 02/15/22 10:55 PM  Result Value Ref Range   Sodium 138 135 - 145 mmol/L   Potassium 3.7 3.5 - 5.1 mmol/L   Chloride 108 98 - 111 mmol/L   CO2 23 22 - 32 mmol/L   Glucose, Bld 177 (H) 70 - 99 mg/dL    Comment: Glucose reference range applies only to samples taken after fasting for at least 8 hours.   BUN 7 6 - 20 mg/dL   Creatinine, Ser 0.63 0.44 - 1.00 mg/dL   Calcium 9.2 8.9 - 10.3 mg/dL   Total Protein 7.2 6.5 - 8.1 g/dL   Albumin 3.6 3.5 - 5.0 g/dL   AST 18 15 - 41 U/L   ALT 21 0 - 44 U/L   Alkaline Phosphatase 62 38 - 126 U/L   Total Bilirubin 0.8 0.3 - 1.2 mg/dL   GFR, Estimated >60 >60 mL/min    Comment: (NOTE) Calculated using the CKD-EPI Creatinine Equation (2021)    Anion gap 7 5 - 15    Comment: Performed at [facility], [place] [address], [place], [place] [address]  Ethanol     Status: None   Collection Time: 02/15/22 10:55 PM  Result Value Ref Range   Alcohol, Ethyl (B) <10 <10 mg/dL    Comment: (NOTE) Lowest detectable limit for serum alcohol is 10 mg/dL.  For medical purposes only. Performed at [facility], [place] [address] [place], [place] [address]   CBC  with Diff     Status: Abnormal   Collection Time: 02/15/22 10:55 PM  Result Value Ref Range   WBC 9.5 4.0 - 10.5 K/uL   RBC 4.43 3.87 - 5.11 MIL/uL   Hemoglobin 10.3 (L) 12.0 - 15.0 g/dL   HCT 33.6 (L) 36.0 - 46.0 %   MCV 75.8 (L) 80.0 - 100.0 fL   MCH 23.3 (L) 26.0 - 34.0 pg   MCHC 30.7 30.0 -  36.0 g/dL   RDW 17.2 (H) 11.5 - 15.5 %   Platelets 344 150 - 400 K/uL   nRBC 0.0 0.0 - 0.2 %   Neutrophils Relative % 76 %   Neutro Abs 7.3 1.7 - 7.7 K/uL   Lymphocytes Relative 15 %   Lymphs Abs 1.4 0.7 - 4.0 K/uL   Monocytes Relative 7 %   Monocytes Absolute 0.7 0.1 - 1.0 K/uL   Eosinophils Relative 1 %   Eosinophils Absolute 0.1 0.0 - 0.5 K/uL   Basophils Relative 1 %   Basophils Absolute 0.1 0.0 - 0.1 K/uL   Immature Granulocytes 0 %   Abs Immature Granulocytes 0.02 0.00 - 0.07 K/uL    Comment: Performed at [facility], [place] [address], [place], [place] [address]  Resp Panel by RT-PCR (Flu A&B, Covid) Anterior Nasal Swab     Status: None   Collection Time: 02/15/22 10:58 PM   Specimen: Anterior Nasal Swab  Result Value Ref Range   SARS Coronavirus 2 by RT PCR NEGATIVE NEGATIVE    Comment: (NOTE) SARS-CoV-2 target nucleic acids are NOT DETECTED.  The SARS-CoV-2 RNA is generally detectable in upper respiratory specimens during the acute phase of infection. The lowest concentration of SARS-CoV-2 viral copies this assay can detect is 138 copies/mL. A negative result does not preclude SARS-Cov-2 infection and should not be used as the sole basis for treatment or other patient management decisions. A negative result may occur with  improper specimen collection/handling, submission of specimen other than nasopharyngeal swab, presence of viral mutation(s) within the areas targeted by this assay, and inadequate number of viral copies(<138 copies/mL). A negative result must be combined with clinical observations, patient history, and epidemiological information. The  expected result is Negative.  Fact Sheet for Patients:  EntrepreneurPulse.com.au  Fact Sheet for Healthcare Providers:  IncredibleEmployment.be  This test is no t yet approved or cleared by the [place] FDA and  has been authorized for detection and/or diagnosis of SARS-CoV-2 by FDA under an Emergency Use Authorization (EUA). This EUA will remain  in effect (meaning this test can be used) for the duration of the COVID-19 declaration under Section 564(b)(1) of the Act, 21 U.S.C.section 360bbb-3(b)(1), unless the authorization is terminated  or revoked sooner.       Influenza A by PCR NEGATIVE NEGATIVE   Influenza B by PCR NEGATIVE NEGATIVE    Comment: (NOTE) The Xpert Xpress SARS-CoV-2/FLU/RSV plus assay is intended as an aid in the diagnosis of influenza from Nasopharyngeal swab specimens and should not be used as a sole basis for treatment. Nasal washings and aspirates are unacceptable for Xpert Xpress SARS-CoV-2/FLU/RSV testing.  Fact Sheet for Patients: EntrepreneurPulse.com.au  Fact Sheet for Healthcare Providers: IncredibleEmployment.be  This test is not yet approved or cleared by the [place] FDA and has been authorized for detection and/or diagnosis of SARS-CoV-2 by FDA under an Emergency Use Authorization (EUA). This EUA will remain in effect (meaning this test can be used) for the duration of the COVID-19 declaration under Section 564(b)(1) of the Act, 21 U.S.C. section 360bbb-3(b)(1), unless the authorization is terminated or revoked.  Performed at [facility], [place] [address], [place], [place] [address]   I-Stat beta hCG blood, ED     Status: None   Collection Time: 02/15/22 10:59 PM  Result Value Ref Range   I-stat hCG, quantitative <5.0 <5 mIU/mL   Comment 3  Comment:   GEST. AGE      CONC.  (mIU/mL)   <=1 WEEK        5 - 50     2 WEEKS       50  - 500     3 WEEKS       100 - 10,000     4 WEEKS     1,000 - 30,000        FEMALE AND NON-PREGNANT FEMALE:     LESS THAN 5 mIU/mL     Current Facility-Administered Medications  Medication Dose Route Frequency Provider Last Rate Last Admin   ARIPiprazole (ABILIFY) tablet 10 mg  10 mg Oral Daily Lindon Romp A, NP       benztropine (COGENTIN) tablet 0.5 mg  0.5 mg Oral BID Lindon Romp A, NP       LORazepam (ATIVAN) tablet 0.5 mg  0.5 mg Oral BID Lindon Romp A, NP       LORazepam (ATIVAN) tablet 1 mg  1 mg Oral Once Rozetta Nunnery, NP       Current Outpatient Medications  Medication Sig Dispense Refill   ARIPiprazole (ABILIFY) 10 MG tablet Take 10 mg by mouth daily.     ARIPiprazole (ABILIFY) 15 MG tablet Take 30 mg by mouth daily.     divalproex (DEPAKOTE) 250 MG DR tablet Take 250 mg by mouth 3 (three) times daily.     hydrOXYzine (ATARAX) 25 MG tablet Take 1 tablet (25 mg total) by mouth 3 (three) times daily as needed for anxiety. For anxiety 30 tablet 0   metFORMIN (GLUCOPHAGE) 1000 MG tablet Take 1 tablet (1,000 mg total) by mouth 2 (two) times daily with a meal. diabetes 60 tablet 0   traZODone (DESYREL) 50 MG tablet Take 1 tablet (50 mg total) by mouth at bedtime as needed for sleep. For sleep 30 tablet 0   atorvastatin (LIPITOR) 80 MG tablet Take 1 tablet (80 mg total) by mouth daily. Elevated cholesterol (Patient not taking: Reported on 02/16/2022) 30 tablet 0   benztropine (COGENTIN) 0.5 MG tablet Take 1 tablet (0.5 mg total) by mouth 2 (two) times daily. tremors (Patient not taking: Reported on 01/31/2022) 60 tablet 0   dapagliflozin propanediol (FARXIGA) 10 MG TABS tablet Take 10 mg by mouth daily. (Patient not taking: Reported on 02/16/2022)     divalproex (DEPAKOTE) 500 MG DR tablet Take 1 tablet (500 mg total) by mouth 2 (two) times daily. Mood disorder (Patient not taking: Reported on 02/16/2022) 30 tablet 0   docusate sodium (COLACE) 100 MG capsule Take 1 capsule (100 mg total)  by mouth daily. Stool softener (Patient not taking: Reported on 01/31/2022) 10 capsule 0   ferrous sulfate 325 (65 FE) MG tablet Take 1 tablet (325 mg total) by mouth 2 (two) times daily with a meal. Iron supplement (Patient not taking: Reported on 01/31/2022) 30 tablet 0   insulin aspart (NOVOLOG) 100 UNIT/ML injection Inject 0-9 Units into the skin 3 (three) times daily with meals. For elevated blood glucose (Patient not taking: Reported on 01/31/2022) 10 mL 0   insulin glargine-yfgn (SEMGLEE) 100 UNIT/ML injection Inject 0.16 mLs (16 Units total) into the skin daily. High blood glucose (Patient not taking: Reported on 01/31/2022) 10 mL 0   lisinopril (ZESTRIL) 5 MG tablet Take 1 tablet (5 mg total) by mouth daily. For high blood pressure (Patient not taking: Reported on 02/16/2022) 30 tablet 0   LORazepam (ATIVAN) 0.5 MG tablet Take  1 tablet (0.5 mg total) by mouth 2 (two) times daily. For anxiety (Patient not taking: Reported on 02/16/2022) 60 tablet 0   propranolol (INDERAL) 10 MG tablet Take 1 tablet (10 mg total) by mouth 2 (two) times daily. For anxiety and hypertension (Patient not taking: Reported on 01/31/2022) 30 tablet 0   QUEtiapine (SEROQUEL) 50 MG tablet Take 1 tablet (50 mg total) by mouth at bedtime. For mood disorder (Patient not taking: Reported on 01/31/2022) 30 tablet 0   risperiDONE (RISPERDAL M-TABS) 3 MG disintegrating tablet Take 1 tablet (3 mg total) by mouth daily. Mood disorder. (Patient not taking: Reported on 01/31/2022) 30 tablet 0   risperiDONE (RISPERDAL M-TABS) 4 MG disintegrating tablet Take 1 tablet (4 mg total) by mouth at bedtime. For mood disorder (Patient not taking: Reported on 01/31/2022) 30 tablet 0   VICTOZA 18 MG/3ML SOPN Inject 1.8 mg into the skin daily. (Patient not taking: Reported on 02/16/2022)     Vitamin D3 (VITAMIN D) 25 MCG tablet Take 1 tablet (1,000 Units total) by mouth daily. Vitamin D supplement (Patient not taking: Reported on 01/31/2022) 60 tablet 0     Musculoskeletal: Strength & Muscle Tone: within normal limits Gait & Station:  not evaluated Patient leans: N/A   Psychiatric Specialty Exam: Presentation  General Appearance: Fairly Groomed  Eye Contact:Fair  Speech:Slow  Speech Volume:Decreased  Handedness:Right   Mood and Affect  Mood:Depressed; Anxious  Affect:Tearful   Thought Process  Thought Processes:Disorganized  Descriptions of Associations:Loose  Orientation:-- (UTA patient providing minimal responses)  Thought Content:Illogical; Tangential  History of Schizophrenia/Schizoaffective disorder:Yes  Duration of Psychotic Symptoms:Greater than six months  Hallucinations:No data recorded Ideas of Reference:None  Suicidal Thoughts:Suicidal Thoughts: No  Homicidal Thoughts:Homicidal Thoughts: No   Sensorium  Memory:Immediate Poor  Judgment:Impaired  Insight:Poor   Executive Functions  Concentration:Poor  Attention Span:Poor  Recall:Poor  Fund of Knowledge:Poor  Language:Poor   Psychomotor Activity  Psychomotor Activity:Psychomotor Activity: Decreased   Assets  Assets:Physical Health; Resilience; Social Support    Sleep  Sleep:No data recorded  Physical Exam: Physical Exam Constitutional:      General: She is not in acute distress.    Appearance: She is not ill-appearing, toxic-appearing or diaphoretic.  HENT:     Right Ear: External ear normal.     Left Ear: External ear normal.  Cardiovascular:     Rate and Rhythm: Normal rate.  Skin:    General: Skin is warm and dry.  Neurological:     Mental Status: She is alert.  Psychiatric:        Speech: Speech is delayed.        Thought Content: Thought content does not include homicidal or suicidal ideation.    Review of Systems  Unable to perform ROS: Psychiatric disorder  Psychiatric/Behavioral:  Negative for suicidal ideas.    Blood pressure (!) 162/102, pulse 82, temperature 98.6 F (37 C), temperature source  Oral, resp. rate 18, height '5\' 11"'$  (1.803 m), SpO2 97 %. Body mass index is 40.74 kg/m.  Medical Decision Making: [name] is a 46 y.o. female patient with a history of schizoaffective disorder-bipolar type who presents to [facility] via EMS for psychosis. Per EDP note, patient reported that people were trying to kill her.Patient evaluated twice today. Patient provides minimal responses. Speech is decreased in volume and slow. Responses she dose provide are delayed. She primarily stares toward provider when asked questions. Patient noted to be tearful at times and then laughing at times during the  assessment. Patient becomes tearful when asked about AVH, and states "I don't know." She denies SI. She denies HI.   Medications were reviewed with the patient's sister by pharmacy. It appears the most recently prescribed antipsychotic is Abilify 30 mg daily. Per dispense report, abilify 30 mg daily was dispensed on 02/14/2022.   Restart Abilify 20 mg daily for schizoaffective disorder Restart ativan 0.5 mg BID prn for anxiety  Give ativan 1 mg oral x 1 dose now for anxiety  Problem 1: Schizoaffective Disorder-Bipolar Type   Disposition: Recommend psychiatric Inpatient admission when medically cleared.  Rozetta Nunnery, NP 02/16/2022 2:16 PM

## 2022-02-16 NOTE — ED Notes (Signed)
Pt sleeping. Respirations equal unlabored.

## 2022-02-16 NOTE — BH Assessment (Signed)
TTS clinician attempted TTS assessment. Per Caryl Pina, RN, patient received Ge[place] and asleep, unable to arouse. TTS will attempt at later time.

## 2022-02-16 NOTE — ED Notes (Signed)
Pt sleeping. Will hold off lunch tray

## 2022-02-16 NOTE — ED Notes (Signed)
Pt changed into burgundy scrubs. Pt required max. Assist to bathroom, d/t pt unable to follow directions. Pt unsteady gait, required wheelchair from bathroom to bed. Pt has 2 personal belonging's bag, both labeled. 1 personal belonging's bag has her shirt, jean shorts, and a hazard bag (labeled) w her phone and phone charger in there. The other personal belonging's bag has her black sneakers. Bags placed in cabinets: 9-12. Security wanded pt.

## 2022-02-16 NOTE — ED Notes (Signed)
To room to provide ordered medications.  Pt does not answer nurse verbally.  When nurse states to pt that she has medications, pt barely nods slowly.  Pt barely opens mouth and allows RN to place medications in her mouth.

## 2022-02-16 NOTE — Progress Notes (Signed)
Inpatient Behavioral Health Placement  Pt meets inpatient criteria per Lindon Romp, NP. There are no available bed at [facility].  Referral was sent to the following facilities;    Destination Service Provider Address Phone [facility]  [address] [place], [name] [address] [phone] [facility]  [place] [place] [address] [place]  [facility]  [address] [place] [address] [phone] [place] [address] [phone]  [facility]  [place], [place] [address] [phone]  [facility]  [address] [place] [address] [phone] [place] [address] [phone]  [facility]  [address], [place] [address] [phone]  [facility]  [address] [place] [address] [phone]  [facility]  [address] [place] [address] [phone]  [facility]  [address] [place] [address] [phone] [facility]  [address] [place] [address] [phone] [place], [place] [address] [phone]   Situation ongoing,  CSW will follow up.   Benjaman Kindler, MSW, [facility] 02/16/2022  @ 10:39 PM

## 2022-02-16 NOTE — ED Notes (Signed)
Pt getting agitated. Pt noticed a visitor of another pt and states "why are you looking at me", asks "why is he there", "what is he doing there". RN aware

## 2022-02-16 NOTE — ED Notes (Signed)
Belongings placed in locker 28

## 2022-02-16 NOTE — ED Provider Notes (Signed)
Emergency Medicine Observation Re-evaluation Note  [name] is a 46 y.o. female, seen on rounds today.  Pt initially presented to the ED for complaints of Psychiatric Evaluation Currently, the patient is resting comfortably.  Physical Exam  BP (!) 154/89   Pulse 76   Temp 97.9 F (36.6 C) (Oral)   Resp 18   Ht '5\' 11"'$  (1.803 m)   SpO2 99%   BMI 40.74 kg/m  Physical Exam General: NAD   ED Course / MDM  EKG:   I have reviewed the labs performed to date as well as medications administered while in observation.  Recent changes in the last 24 hours include no acute events reported.  Plan  Current plan is for placement.  [name] is not under involuntary commitment.     Valarie Merino, MD 02/16/22 [phone]

## 2022-02-16 NOTE — ED Notes (Signed)
TTS called to see if pt was able to participate in assessment. This writer advised that pt was still sleeping after receiving medication earlier this evening. TTS will call back at a later time to assess pt. Paramedic Bratu made aware.

## 2022-02-16 NOTE — BH Assessment (Signed)
[facility] Assessment Progress Note   Per Lindon Romp, NP, this voluntary pt requires psychiatric hospitalization at this time.  [facility] will not be able to accommodate this pt today.  At the direction of Hampton Abbot, MD this writer has sought placement for pt at facilities outside of the [facility] system.  The following facilities have been contacted to seek placement for this pt, with results as noted:  Beds available, information sent, decision pending: [facility]  At capacity: [facility]   If this voluntary pt is accepted to a facility, please discuss disposition with pt to be sure that she agrees to the plan.  If a facility agrees to accept pt and the plan changes in any way please call the facility to inform them of the change.  Final disposition is pending as of this writing.  [name], [place] Coordinator [phone]

## 2022-02-16 NOTE — ED Notes (Signed)
Pt arrives to room 28 via wheelchair.  Pt transferred to bed.  Pt in room crying at this time.  Will monitor.

## 2022-02-16 NOTE — ED Notes (Signed)
Pt very tearful, talking/mumbling to self.

## 2022-02-16 NOTE — ED Notes (Signed)
Pt woke up and is crying. States she wants to see the doctor

## 2022-02-16 NOTE — ED Notes (Signed)
Pt woke up and is crying att. This Probation officer asked her what's wrong. Pt mumbles and moans, and does not answer question. Attempted to ask again, pt continues to cry and not answer question. RN aware

## 2022-02-17 DIAGNOSIS — F250 Schizoaffective disorder, bipolar type: Secondary | ICD-10-CM

## 2022-02-17 NOTE — Discharge Summary (Signed)
[facility] Psych ED Discharge  02/17/2022 9:46 AM [name]  MRN:  [id number]  Principal Problem: Schizoaffective disorder, bipolar type [facility]) Discharge Diagnoses: Principal Problem:   Schizoaffective disorder, bipolar type ([place])  Clinical Impression:  Final diagnoses:  Schizoaffective disorder, bipolar type ([place])   Subjective: [name] is a 46 y.o. female patient with a history of schizoaffective disorder-bipolar type who presents to [facility] via EMS for psychosis. Per EDP note, patient reported that people were trying to kill her.   On evaluation this morning, the patient is sitting on the side of her. She is alert and oriented x 4. She is neatly groomed. Eye contact is good. Speech is clear and coherent. She is future oriented and goal directed. Thought process is coherent. Thought content is logical. Patient states that she was transported back to [facility] from [facility] via medical transport service. She states that she has friends in this area and returned to [place] to live with a group of friends that share a 4 bedroom house. She states that while she was at [facility] she was continued on Abilify. Reports that she has prescriptions. States that she has an appointment at [facility] on 02/22/22. Patient encouraged to follow up with this appointment. Patient reports that she occasionally uses Delta 8. Discussed abstaining from Delta 8 and similar products. Patient denies suicidal ideations. Denies homicidal ideations. Denies AVH. No indication that she is responding to internal stimuli.  No delusions elicited during this assessment.   ED Assessment Time Calculation: Start Time: 0915 Stop Time: 0935 Total Time in Minutes (Assessment Completion): 20   Past Psychiatric History: Schizoaffective Disorder-Bipolar Type. She was inpatient at [facility] 05/2021. At that time she was discharged on seroquel 50 mg QHS, risperidone 3 mg daily and 4 mg QHS, depakote 500 mg BID, and cogentin 0.5 mg  BID.  Past Medical History:  Past Medical History:  Diagnosis Date   Abnormal Pap smear    Bipolar 1 disorder ([place])    Diabetes mellitus without complication (HCC)    Fibroids    Hypertension    IBS (irritable bowel syndrome)     Past Surgical History:  Procedure Laterality Date   CERVICAL BIOPSY     CHOLECYSTECTOMY     ESSURE TUBAL LIGATION     HIATAL HERNIA REPAIR     TUBAL LIGATION     Family History:  Family History  Problem Relation Age of Onset   Diabetes Father    Diabetes Paternal Grandmother    Diabetes Maternal Grandmother    Mental illness Cousin    Healthy Mother     Social History:  Social History   Substance and Sexual Activity  Alcohol Use Not Currently   Comment: occasional     Social History   Substance and Sexual Activity  Drug Use Not Currently    Social History   Socioeconomic History   Marital status: Single    Spouse name: Not on file   Number of children: 2   Years of education: Not on file   Highest education level: Master's degree (e.g., MA, MS, MEng, MEd, MSW, MBA)  Occupational History   Not on file  Tobacco Use   Smoking status: Former    Types: Cigarettes   Smokeless tobacco: Never  Vaping Use   Vaping Use: Some days   Substances: Nicotine, Flavoring  Substance and Sexual Activity   Alcohol use: Not Currently    Comment: occasional   Drug use: Not Currently   Sexual  activity: Yes    Birth control/protection: Surgical  Other Topics Concern   Not on file  Social History Narrative   ** Merged History Encounter **       Social Determinants of Health   Financial Resource Strain: Not on file  Food Insecurity: Not on file  Transportation Needs: Not on file  Physical Activity: Not on file  Stress: Not on file  Social Connections: Not on file    Tobacco Cessation:  N/A, patient does not currently use tobacco products  Current Medications: Current Facility-Administered Medications  Medication Dose Route Frequency  Provider Last Rate Last Admin   acetaminophen (TYLENOL) tablet 650 mg  650 mg Oral Q6H PRN Tegeler, Gwenyth Allegra, MD   650 mg at 02/16/22 2044   ARIPiprazole (ABILIFY) tablet 20 mg  20 mg Oral Daily Lindon Romp A, NP   20 mg at 02/17/22 0939   benztropine (COGENTIN) tablet 0.5 mg  0.5 mg Oral BID Lindon Romp A, NP   0.5 mg at 02/17/22 0939   LORazepam (ATIVAN) tablet 0.5 mg  0.5 mg Oral BID PRN Rozetta Nunnery, NP       Current Outpatient Medications  Medication Sig Dispense Refill   ARIPiprazole (ABILIFY) 10 MG tablet Take 10 mg by mouth daily.     ARIPiprazole (ABILIFY) 15 MG tablet Take 30 mg by mouth daily.     divalproex (DEPAKOTE) 250 MG DR tablet Take 250 mg by mouth 3 (three) times daily.     hydrOXYzine (ATARAX) 25 MG tablet Take 1 tablet (25 mg total) by mouth 3 (three) times daily as needed for anxiety. For anxiety 30 tablet 0   metFORMIN (GLUCOPHAGE) 1000 MG tablet Take 1 tablet (1,000 mg total) by mouth 2 (two) times daily with a meal. diabetes 60 tablet 0   traZODone (DESYREL) 50 MG tablet Take 1 tablet (50 mg total) by mouth at bedtime as needed for sleep. For sleep 30 tablet 0   atorvastatin (LIPITOR) 80 MG tablet Take 1 tablet (80 mg total) by mouth daily. Elevated cholesterol (Patient not taking: Reported on 02/16/2022) 30 tablet 0   benztropine (COGENTIN) 0.5 MG tablet Take 1 tablet (0.5 mg total) by mouth 2 (two) times daily. tremors (Patient not taking: Reported on 01/31/2022) 60 tablet 0   dapagliflozin propanediol (FARXIGA) 10 MG TABS tablet Take 10 mg by mouth daily. (Patient not taking: Reported on 02/16/2022)     divalproex (DEPAKOTE) 500 MG DR tablet Take 1 tablet (500 mg total) by mouth 2 (two) times daily. Mood disorder (Patient not taking: Reported on 02/16/2022) 30 tablet 0   docusate sodium (COLACE) 100 MG capsule Take 1 capsule (100 mg total) by mouth daily. Stool softener (Patient not taking: Reported on 01/31/2022) 10 capsule 0   ferrous sulfate 325 (65 FE) MG tablet  Take 1 tablet (325 mg total) by mouth 2 (two) times daily with a meal. Iron supplement (Patient not taking: Reported on 01/31/2022) 30 tablet 0   insulin aspart (NOVOLOG) 100 UNIT/ML injection Inject 0-9 Units into the skin 3 (three) times daily with meals. For elevated blood glucose (Patient not taking: Reported on 01/31/2022) 10 mL 0   insulin glargine-yfgn (SEMGLEE) 100 UNIT/ML injection Inject 0.16 mLs (16 Units total) into the skin daily. High blood glucose (Patient not taking: Reported on 01/31/2022) 10 mL 0   lisinopril (ZESTRIL) 5 MG tablet Take 1 tablet (5 mg total) by mouth daily. For high blood pressure (Patient not taking: Reported on 02/16/2022) 30  tablet 0   LORazepam (ATIVAN) 0.5 MG tablet Take 1 tablet (0.5 mg total) by mouth 2 (two) times daily. For anxiety (Patient not taking: Reported on 02/16/2022) 60 tablet 0   propranolol (INDERAL) 10 MG tablet Take 1 tablet (10 mg total) by mouth 2 (two) times daily. For anxiety and hypertension (Patient not taking: Reported on 01/31/2022) 30 tablet 0   QUEtiapine (SEROQUEL) 50 MG tablet Take 1 tablet (50 mg total) by mouth at bedtime. For mood disorder (Patient not taking: Reported on 01/31/2022) 30 tablet 0   risperiDONE (RISPERDAL M-TABS) 3 MG disintegrating tablet Take 1 tablet (3 mg total) by mouth daily. Mood disorder. (Patient not taking: Reported on 01/31/2022) 30 tablet 0   risperiDONE (RISPERDAL M-TABS) 4 MG disintegrating tablet Take 1 tablet (4 mg total) by mouth at bedtime. For mood disorder (Patient not taking: Reported on 01/31/2022) 30 tablet 0   VICTOZA 18 MG/3ML SOPN Inject 1.8 mg into the skin daily. (Patient not taking: Reported on 02/16/2022)     Vitamin D3 (VITAMIN D) 25 MCG tablet Take 1 tablet (1,000 Units total) by mouth daily. Vitamin D supplement (Patient not taking: Reported on 01/31/2022) 60 tablet 0   PTA Medications: (Not in a hospital admission)   [place]:  [place] ED from 02/15/2022 in [place] DEPT ED from 01/31/2022 in [place] Admission (Discharged) from 05/16/2021 in [place] [facility]  C-SSRS RISK CATEGORY No Risk No Risk No Risk       Musculoskeletal: Strength & Muscle Tone: within normal limits Gait & Station: normal Patient leans: N/A  Psychiatric Specialty Exam: Presentation  General Appearance: Neat  Eye Contact:Good  Speech:Clear and Coherent; Normal Rate  Speech Volume:Normal  Handedness:Right   Mood and Affect  Mood:Anxious  Affect:Congruent   Thought Process  Thought Processes:Coherent; Goal Directed; Linear  Descriptions of Associations:Intact  Orientation:Full (Time, Place and Person)  Thought Content:Logical  History of Schizophrenia/Schizoaffective disorder:Yes  Duration of Psychotic Symptoms:Greater than six months  Hallucinations:Hallucinations: None  Ideas of Reference:None  Suicidal Thoughts:Suicidal Thoughts: No  Homicidal Thoughts:Homicidal Thoughts: No   Sensorium  Memory:Immediate Good; Recent Good; Remote Good  Judgment:Fair  Insight:Fair   Executive Functions  Concentration:Good  Attention Span:Good  [place] of Knowledge:Good  Language:Good   Psychomotor Activity  Psychomotor Activity:Psychomotor Activity: Normal   Assets  Assets:Communication Skills; Desire for Improvement   Sleep  Sleep:Sleep: Fair    Physical Exam: Physical Exam Constitutional:      General: She is not in acute distress.    Appearance: She is not ill-appearing, toxic-appearing or diaphoretic.  HENT:     Right Ear: External ear normal.     Left Ear: External ear normal.  Eyes:     General:        Right eye: No discharge.        Left eye: No discharge.     Pupils: Pupils are equal, round, and reactive to light.  Cardiovascular:     Rate and Rhythm: Normal rate.  Pulmonary:     Effort: Pulmonary effort is normal. No  respiratory distress.  Musculoskeletal:        General: Normal range of motion.     Cervical back: Normal range of motion.  Neurological:     Mental Status: She is alert and oriented to person, place, and time.  Psychiatric:        Mood and Affect: Mood is anxious.  Behavior: Behavior is cooperative.        Thought Content: Thought content is not paranoid. Thought content does not include homicidal or suicidal ideation.    Review of Systems  Constitutional:  Negative for chills, diaphoresis, fever, malaise/fatigue and weight loss.  HENT:  Negative for congestion.   Respiratory:  Negative for cough and shortness of breath.   Cardiovascular:  Negative for chest pain and palpitations.  Gastrointestinal:  Negative for diarrhea, nausea and vomiting.  Neurological:  Negative for dizziness and seizures.  Psychiatric/Behavioral:  Positive for depression. Negative for hallucinations, memory loss and suicidal ideas. The patient is nervous/anxious and has insomnia.   All other systems reviewed and are negative.  Blood pressure 137/81, pulse 80, temperature 98.4 F (36.9 C), temperature source Oral, resp. rate 18, height '5\' 11"'$  (1.803 m), SpO2 99 %. Body mass index is 40.74 kg/m.   Demographic Factors:  Low socioeconomic status  Loss Factors: Financial problems/change in socioeconomic status  Historical Factors: Family history of mental illness or substance abuse  Risk Reduction Factors:   Sense of responsibility to family and Religious beliefs about death  Continued Clinical Symptoms:  Schizoaffective disorder-bipolar type  Cognitive Features That Contribute To Risk:  None    Suicide Risk:  Minimal: No identifiable suicidal ideation.  Patients presenting with no risk factors but with morbid ruminations; may be classified as minimal risk based on the severity of the depressive symptoms   Medical Decision Making: On evaluation patient is alert and oriented x 4, pleasant,  and cooperative. Speech is clear and coherent. Mood is anxious and affect is congruent with mood. Thought process is coherent and thought content is logical. Denies auditory and visual hallucinations. No indication that patient is responding to internal stimuli. No evidence of delusional thought content. Denies suicidal ideations. Denies homicidal ideations.   Continue home medications as prescribed by [facility]. Patient reports that she she has prescriptions. Patient reports that she has an appointment at [facility] on 02/22/22.   At time of discharge, patient denies SI, HI, AVH and is able to contract for safety. He demonstrated no overt evidence of psychosis or mania. Prior to discharge the patient verbalized understanding of the warning signs, triggers, and symptoms of worsening mental health and how to access emergency mental health care if they felt it was needed. Patient was instructed to call 911 or return to the emergency room if they experienced any concerning symptoms after discharge. Patient voiced understanding and agreed to this.   Problem 1: Schizoaffective Disorder-Bipolar Type  Disposition: No evidence of imminent risk to self or others at present.   Patient does not meet criteria for psychiatric inpatient admission. Discussed crisis plan, support from social network, calling 911, coming to the Emergency Department, and calling Suicide Hotline.   Rozetta Nunnery, NP 02/17/2022, 9:46 AM

## 2022-02-17 NOTE — ED Provider Notes (Addendum)
Emergency Medicine Observation Re-evaluation Note  [name] is a 46 y.o. female, seen on rounds today.  Pt initially presented to the ED for complaints of Psychiatric Evaluation Currently, the patient reports feeling improved today. Normal appetite, eating breakfast.   Physical Exam  BP 137/81   Pulse 80   Temp 98.4 F (36.9 C) (Oral)   Resp 18   Ht 1.803 m ('5\' 11"'$ )   SpO2 99%   BMI 40.74 kg/m  Physical Exam General: alert, content.  Cardiac: regular rate. Lungs: breathing comfortably. Psych: normal mood and affect. Conversant. No SI/HI. Patient appears improved compared to prior notes. Currently does not appear to be responding to internal stimuli.  Acknowledges recent paranoid type thoughts and periods of anxietymind-racing, but feels those earlier symptoms have slowly improved.   ED Course / MDM    I have reviewed the labs performed to date as well as medications administered while in observation.  Recent changes in the last 24 hours include ED obs, med management, reassessment.   Plan  [facility] team to reassess - med management and dispo per [facility] team.   Lajean Saver, MD 02/17/22 [phone]  [facility] team has reassessed, and has psych cleared and is discharging patient.  Patient indicates has f/u appt with [facility] already arranged, and that she will keep that appt.       Lajean Saver, MD 02/17/22 1030

## 2022-02-17 NOTE — Discharge Instructions (Addendum)
It was our pleasure to provide your ER care today - we hope that you continue to feel better.   Follow up closely in the coming week with your behavioral health provider as planned.   For mental health issues and/or mental health crisis you may also go directly to the [facility] [place] - it is open 24/7 and walk-ins are welcome.   Return to ER if worse, new symptoms, fevers, chest pain, trouble breathing, persistent vomiting, or other concern.    For your behavioral health needs you are advised to keep your previously scheduled appointment with [facility]:  For your mental health needs, you are advised to continue treatment with [facility]:       [facility]      [address], [place], [place] [address]      [phone]  If for any reason you are unable to follow up with [facility], contact one of the providers listed below:       [address] [place], [place] [address]      [phone]      They offer psychiatry/medication management and therapy.  New patients are seen in their walk-in clinic.  Walk-in hours are Monday, Wednesday, Thursday and Friday from 8:00 am - 11:00 am for psychiatry, and Monday and Wednesday from 8:00 am - 11:00 am for therapy.  Walk-in patients are seen on a first come, first served basis, so try to arrive as early as possible for the best chance of being seen the same day.  BE SURE TO TAKE THE ELEVATOR TO THE SECOND FLOOR.  Please note that to be eligible for services you must bring an ID or a piece of mail with your name and a [facility] address.        [facility] [place],  [address]      [phone]       New patients are seen at their walk-in clinic.  Walk-in hours are Monday - Friday from 8:30 am - 12:00 pm, and from 1:00 pm - 2:30 pm.  Walk-in patients are seen on a first come, first served basis, so try to arrive as  early as possible for the best chance of being seen the same day.  _________________________________ Discharge recommendations:  Patient is to take medications as prescribed. Please see information for follow-up appointment with psychiatry and therapy. Please follow up with your primary care provider for all medical related needs.   Therapy: We recommend that patient participate in individual therapy to address mental health concerns.  Medications: The patient is to contact a medical professional and/or outpatient provider to address any new side effects that develop. Patient should update outpatient providers of any new medications and/or medication changes.   Atypical antipsychotics: If you are prescribed an atypical antipsychotic, it is recommended that your height, weight, BMI, blood pressure, fasting lipid panel, and fasting blood sugar be monitored by your outpatient providers.  Safety:  The patient should abstain from use of illicit substances/drugs and abuse of any medications. If symptoms worsen or do not continue to improve or if the patient becomes actively suicidal or homicidal then it is recommended that the patient return to the closest hospital emergency department, the [facility], or call 911 for  further evaluation and treatment. National Suicide Prevention Lifeline 1-800-SUICIDE or 430-528-7426.  About 988 988 offers 24/7 access to trained crisis counselors who can help people experiencing mental health-related distress. People can call or text 988 or chat 988lifeline.org for themselves or if they are worried about a loved one who may need crisis support.  Crisis Mobile: [facility]:                     613-007-1828 (for crisis response 24 hours a day) [place]:                                            903-424-7298   For your shelter needs, contact the following service providers:       [facility]  (operated by [facility])      [place], [place] [address]      [phone]       [place]      [place], [place] [address]      [phone]   For day shelter and other supportive services for the homeless, contact the [facility]):       [facility]      [place], [place] [address]      [phone]  For transitional housing, Secondary school teacher.  They provide longer term housing than a shelter, but there is an application process:       [facility] [employer] [facility] [place]. [address], [place] [address]      [phone]

## 2022-09-18 ENCOUNTER — Telehealth: Payer: Self-pay

## 2022-09-18 NOTE — Transitions of Care (Post Inpatient/ED Visit) (Unsigned)
   09/18/2022  Name: [name] MRN: [id number] DOB: 30-May-1976  Today's TOC FU Call Status: Today's TOC FU Call Status:: Unsuccessul Call (1st Attempt) Unsuccessful Call (1st Attempt) Date: 09/18/22  Attempted to reach the patient regarding the most recent Inpatient/ED visit.  Follow Up Plan: Additional outreach attempts will be made to reach the patient to complete the Transitions of Care (Post Inpatient/ED visit) call.   [place] LPN [place] Advisor Direct Dial [phone]

## 2022-09-19 NOTE — Transitions of Care (Post Inpatient/ED Visit) (Unsigned)
   09/19/2022  Name: [name] MRN: [id number] DOB: 08-17-1975  Today's TOC FU Call Status: Today's TOC FU Call Status:: Unsuccessful Call (2nd Attempt) Unsuccessful Call (1st Attempt) Date: 09/18/22 Unsuccessful Call (2nd Attempt) Date: 09/19/22  Attempted to reach the patient regarding the most recent Inpatient/ED visit.  Follow Up Plan: Additional outreach attempts will be made to reach the patient to complete the Transitions of Care (Post Inpatient/ED visit) call.   [place] LPN [place] Advisor Direct Dial [phone]

## 2022-09-20 NOTE — Transitions of Care (Post Inpatient/ED Visit) (Signed)
   09/20/2022  Name: [name] MRN: [id number] DOB: 1976/01/15  Today's TOC FU Call Status: Today's TOC FU Call Status:: Unsuccessful Call (3rd Attempt) Unsuccessful Call (1st Attempt) Date: 09/18/22 Unsuccessful Call (2nd Attempt) Date: 09/19/22 Unsuccessful Call (3rd Attempt) Date: 09/20/22  Attempted to reach the patient regarding the most recent Inpatient/ED visit.  Follow Up Plan: No further outreach attempts will be made at this time. We have been unable to contact the patient.. [place] Direct Dial [phone]

## 2022-11-08 ENCOUNTER — Encounter (HOSPITAL_COMMUNITY): Payer: Self-pay | Admitting: Emergency Medicine

## 2022-11-08 ENCOUNTER — Other Ambulatory Visit: Payer: Self-pay

## 2022-11-08 ENCOUNTER — Observation Stay (HOSPITAL_COMMUNITY): Payer: Medicaid Other

## 2022-11-08 ENCOUNTER — Inpatient Hospital Stay (HOSPITAL_COMMUNITY)
Admission: EM | Admit: 2022-11-08 | Discharge: 2022-11-13 | DRG: 638 | Disposition: A | Discharge status: Home / Self Care | Payer: Medicaid Other | Attending: Internal Medicine | Admitting: Internal Medicine

## 2022-11-08 DIAGNOSIS — E7800 Pure hypercholesterolemia, unspecified: Secondary | ICD-10-CM | POA: Diagnosis present

## 2022-11-08 DIAGNOSIS — T383X6A Underdosing of insulin and oral hypoglycemic [antidiabetic] drugs, initial encounter: Secondary | ICD-10-CM | POA: Diagnosis present

## 2022-11-08 DIAGNOSIS — E860 Dehydration: Secondary | ICD-10-CM | POA: Diagnosis present

## 2022-11-08 DIAGNOSIS — E785 Hyperlipidemia, unspecified: Secondary | ICD-10-CM | POA: Diagnosis not present

## 2022-11-08 DIAGNOSIS — Z794 Long term (current) use of insulin: Secondary | ICD-10-CM

## 2022-11-08 DIAGNOSIS — F250 Schizoaffective disorder, bipolar type: Secondary | ICD-10-CM | POA: Diagnosis present

## 2022-11-08 DIAGNOSIS — E876 Hypokalemia: Secondary | ICD-10-CM | POA: Diagnosis present

## 2022-11-08 DIAGNOSIS — Z79899 Other long term (current) drug therapy: Secondary | ICD-10-CM

## 2022-11-08 DIAGNOSIS — Z87891 Personal history of nicotine dependence: Secondary | ICD-10-CM

## 2022-11-08 DIAGNOSIS — Z7984 Long term (current) use of oral hypoglycemic drugs: Secondary | ICD-10-CM

## 2022-11-08 DIAGNOSIS — Z91138 Patient's unintentional underdosing of medication regimen for other reason: Secondary | ICD-10-CM

## 2022-11-08 DIAGNOSIS — I10 Essential (primary) hypertension: Secondary | ICD-10-CM | POA: Diagnosis not present

## 2022-11-08 DIAGNOSIS — D509 Iron deficiency anemia, unspecified: Secondary | ICD-10-CM | POA: Diagnosis present

## 2022-11-08 DIAGNOSIS — Z720 Tobacco use: Secondary | ICD-10-CM | POA: Diagnosis present

## 2022-11-08 DIAGNOSIS — R6510 Systemic inflammatory response syndrome (SIRS) of non-infectious origin without acute organ dysfunction: Secondary | ICD-10-CM | POA: Diagnosis present

## 2022-11-08 DIAGNOSIS — F319 Bipolar disorder, unspecified: Secondary | ICD-10-CM | POA: Diagnosis not present

## 2022-11-08 DIAGNOSIS — I4711 Inappropriate sinus tachycardia, so stated: Secondary | ICD-10-CM | POA: Diagnosis not present

## 2022-11-08 DIAGNOSIS — R55 Syncope and collapse: Secondary | ICD-10-CM | POA: Diagnosis present

## 2022-11-08 DIAGNOSIS — Z833 Family history of diabetes mellitus: Secondary | ICD-10-CM

## 2022-11-08 DIAGNOSIS — E1110 Type 2 diabetes mellitus with ketoacidosis without coma: Principal | ICD-10-CM | POA: Diagnosis present

## 2022-11-08 DIAGNOSIS — K3184 Gastroparesis: Secondary | ICD-10-CM | POA: Diagnosis present

## 2022-11-08 DIAGNOSIS — E1143 Type 2 diabetes mellitus with diabetic autonomic (poly)neuropathy: Secondary | ICD-10-CM | POA: Diagnosis present

## 2022-11-08 DIAGNOSIS — R42 Dizziness and giddiness: Secondary | ICD-10-CM

## 2022-11-08 DIAGNOSIS — R251 Tremor, unspecified: Secondary | ICD-10-CM | POA: Diagnosis present

## 2022-11-08 DIAGNOSIS — D638 Anemia in other chronic diseases classified elsewhere: Secondary | ICD-10-CM | POA: Diagnosis present

## 2022-11-08 LAB — URINALYSIS, ROUTINE W REFLEX MICROSCOPIC
Bacteria, UA: NONE SEEN
Bilirubin Urine: NEGATIVE
Glucose, UA: >=500 mg/dL — AB
Hgb urine dipstick: NEGATIVE
Ketones, ur: 80 mg/dL — AB
Leukocytes,Ua: NEGATIVE
Nitrite: NEGATIVE
Protein, ur: NEGATIVE mg/dL
Specific Gravity, Urine: 1.032 — ABNORMAL HIGH (ref 1.005–1.030)
pH: 5 (ref 5.0–8.0)

## 2022-11-08 LAB — PHOSPHORUS: Phosphorus: 3.9 mg/dL (ref 2.5–4.6)

## 2022-11-08 LAB — COMPREHENSIVE METABOLIC PANEL
ALT: 19 U/L (ref 0–44)
AST: 25 U/L (ref 15–41)
Albumin: 4 g/dL (ref 3.5–5.0)
Alkaline Phosphatase: 107 U/L (ref 38–126)
Anion gap: 17 — ABNORMAL HIGH (ref 5–15)
BUN: 13 mg/dL (ref 6–20)
CO2: 15 mmol/L — ABNORMAL LOW (ref 22–32)
Calcium: 9.7 mg/dL (ref 8.9–10.3)
Chloride: 99 mmol/L (ref 98–111)
Creatinine, Ser: 0.91 mg/dL (ref 0.44–1.00)
GFR, Estimated: >60 mL/min (ref >60)
Glucose, Bld: 460 mg/dL — ABNORMAL HIGH (ref 70–99)
Potassium: 3.6 mmol/L (ref 3.5–5.1)
Sodium: 131 mmol/L — ABNORMAL LOW (ref 135–145)
Total Bilirubin: 1.3 mg/dL — ABNORMAL HIGH (ref 0.3–1.2)
Total Protein: 8.8 g/dL — ABNORMAL HIGH (ref 6.5–8.1)

## 2022-11-08 LAB — CBC WITH DIFFERENTIAL/PLATELET
Abs Immature Granulocytes: 0.06 10*3/uL (ref 0.00–0.07)
Basophils Absolute: 0 10*3/uL (ref 0.0–0.1)
Basophils Relative: 0 %
Eosinophils Absolute: 0 10*3/uL (ref 0.0–0.5)
Eosinophils Relative: 0 %
HCT: 41.1 % (ref 36.0–46.0)
Hemoglobin: 12.7 g/dL (ref 12.0–15.0)
Immature Granulocytes: 0 %
Lymphocytes Relative: 5 %
Lymphs Abs: 0.8 10*3/uL (ref 0.7–4.0)
MCH: 23.3 pg — ABNORMAL LOW (ref 26.0–34.0)
MCHC: 30.9 g/dL (ref 30.0–36.0)
MCV: 75.4 fL — ABNORMAL LOW (ref 80.0–100.0)
Monocytes Absolute: 0.2 10*3/uL (ref 0.1–1.0)
Monocytes Relative: 1 %
Neutro Abs: 14.4 10*3/uL — ABNORMAL HIGH (ref 1.7–7.7)
Neutrophils Relative %: 94 %
Platelets: 460 10*3/uL — ABNORMAL HIGH (ref 150–400)
RBC: 5.45 MIL/uL — ABNORMAL HIGH (ref 3.87–5.11)
RDW: 17.5 % — ABNORMAL HIGH (ref 11.5–15.5)
WBC: 15.5 10*3/uL — ABNORMAL HIGH (ref 4.0–10.5)
nRBC: 0 % (ref 0.0–0.2)

## 2022-11-08 LAB — BASIC METABOLIC PANEL
Anion gap: 17 — ABNORMAL HIGH (ref 5–15)
BUN: 13 mg/dL (ref 6–20)
CO2: 15 mmol/L — ABNORMAL LOW (ref 22–32)
Calcium: 9.7 mg/dL (ref 8.9–10.3)
Chloride: 103 mmol/L (ref 98–111)
Creatinine, Ser: 0.94 mg/dL (ref 0.44–1.00)
GFR, Estimated: >60 mL/min (ref >60)
Glucose, Bld: 334 mg/dL — ABNORMAL HIGH (ref 70–99)
Potassium: 3.2 mmol/L — ABNORMAL LOW (ref 3.5–5.1)
Sodium: 135 mmol/L (ref 135–145)

## 2022-11-08 LAB — HEMOGLOBIN A1C
Hgb A1c MFr Bld: 11.5 % — ABNORMAL HIGH (ref 4.8–5.6)
Mean Plasma Glucose: 283.35 mg/dL

## 2022-11-08 LAB — RAPID URINE DRUG SCREEN, HOSP PERFORMED
Amphetamines: NOT DETECTED
Barbiturates: NOT DETECTED
Benzodiazepines: NOT DETECTED
Cocaine: NOT DETECTED
Opiates: NOT DETECTED
Tetrahydrocannabinol: POSITIVE — AB

## 2022-11-08 LAB — CBG MONITORING, ED
Glucose-Capillary: 436 mg/dL — ABNORMAL HIGH (ref 70–99)
Glucose-Capillary: 425 mg/dL — ABNORMAL HIGH (ref 70–99)
Glucose-Capillary: 433 mg/dL — ABNORMAL HIGH (ref 70–99)
Glucose-Capillary: 353 mg/dL — ABNORMAL HIGH (ref 70–99)

## 2022-11-08 LAB — CK: Total CK: 90 U/L (ref 38–234)

## 2022-11-08 LAB — OSMOLALITY, URINE: Osmolality, Ur: 788 mOsm/kg (ref 300–900)

## 2022-11-08 LAB — GLUCOSE, CAPILLARY
Glucose-Capillary: 247 mg/dL — ABNORMAL HIGH (ref 70–99)
Glucose-Capillary: 277 mg/dL — ABNORMAL HIGH (ref 70–99)
Glucose-Capillary: 307 mg/dL — ABNORMAL HIGH (ref 70–99)

## 2022-11-08 LAB — BLOOD GAS, VENOUS
Acid-base deficit: 9 mmol/L — ABNORMAL HIGH (ref 0.0–2.0)
Bicarbonate: 14.5 mmol/L — ABNORMAL LOW (ref 20.0–28.0)
O2 Saturation: 88.4 %
Patient temperature: 37
pCO2, Ven: 25 mmHg — ABNORMAL LOW (ref 44–60)
pH, Ven: 7.37 (ref 7.25–7.43)
pO2, Ven: 58 mmHg — ABNORMAL HIGH (ref 32–45)

## 2022-11-08 LAB — TSH: TSH: 0.5 u[IU]/mL (ref 0.350–4.500)

## 2022-11-08 LAB — I-STAT BETA HCG BLOOD, ED (MC, WL, AP ONLY): I-stat hCG, quantitative: <5 m[IU]/mL (ref <5)

## 2022-11-08 LAB — MRSA NEXT GEN BY PCR, NASAL: MRSA by PCR Next Gen: NOT DETECTED

## 2022-11-08 LAB — TROPONIN I (HIGH SENSITIVITY)
Troponin I (High Sensitivity): 2 ng/L (ref <18)
Troponin I (High Sensitivity): 3 ng/L (ref <18)

## 2022-11-08 LAB — OSMOLALITY: Osmolality: 308 mOsm/kg — ABNORMAL HIGH (ref 275–295)

## 2022-11-08 LAB — BETA-HYDROXYBUTYRIC ACID: Beta-Hydroxybutyric Acid: 2.47 mmol/L — ABNORMAL HIGH (ref 0.05–0.27)

## 2022-11-08 LAB — MAGNESIUM: Magnesium: 1.8 mg/dL (ref 1.7–2.4)

## 2022-11-08 MED ORDER — TRAZODONE HCL 50 MG PO TABS
50.0000 mg | ORAL_TABLET | Freq: Every evening | ORAL | Status: DC | PRN
  Filled 2022-11-08: qty 1

## 2022-11-08 MED ORDER — METOCLOPRAMIDE HCL 5 MG/ML IJ SOLN
10.0000 mg | Freq: Once | INTRAMUSCULAR | Status: AC
  Administered 2022-11-08: 10 mg via INTRAVENOUS
  Filled 2022-11-08: qty 2

## 2022-11-08 MED ORDER — PROCHLORPERAZINE EDISYLATE 10 MG/2ML IJ SOLN
10.0000 mg | Freq: Four times a day (QID) | INTRAMUSCULAR | Status: AC | PRN
  Administered 2022-11-08 – 2022-11-09 (×2): 10 mg via INTRAVENOUS
  Filled 2022-11-08 (×2): qty 2

## 2022-11-08 MED ORDER — LACTATED RINGERS IV BOLUS
1000.0000 mL | Freq: Once | INTRAVENOUS | Status: AC
  Administered 2022-11-08: 1000 mL via INTRAVENOUS

## 2022-11-08 MED ORDER — LACTATED RINGERS IV SOLN: INTRAVENOUS | Status: DC

## 2022-11-08 MED ORDER — DEXTROSE IN LACTATED RINGERS 5 % IV SOLN: INTRAVENOUS | Status: DC

## 2022-11-08 MED ORDER — LABETALOL HCL 5 MG/ML IV SOLN
10.0000 mg | INTRAVENOUS | Status: DC | PRN
  Administered 2022-11-09: 10 mg via INTRAVENOUS
  Filled 2022-11-08: qty 4

## 2022-11-08 MED ORDER — ONDANSETRON HCL 4 MG/2ML IJ SOLN
4.0000 mg | Freq: Once | INTRAMUSCULAR | Status: AC
  Administered 2022-11-08: 4 mg via INTRAVENOUS
  Filled 2022-11-08: qty 2

## 2022-11-08 MED ORDER — LISINOPRIL 2.5 MG PO TABS: 5.0000 mg | ORAL_TABLET | Freq: Every day | ORAL | Status: DC

## 2022-11-08 MED ORDER — DEXTROSE 50 % IV SOLN: 0.0000 mL | INTRAVENOUS | Status: DC | PRN

## 2022-11-08 MED ORDER — CHLORHEXIDINE GLUCONATE CLOTH 2 % EX PADS
6.0000 | MEDICATED_PAD | Freq: Every day | CUTANEOUS | Status: DC
  Administered 2022-11-09 – 2022-11-10 (×2): 6 via TOPICAL

## 2022-11-08 MED ORDER — INSULIN REGULAR(HUMAN) IN NACL 100-0.9 UT/100ML-% IV SOLN
INTRAVENOUS | Status: DC
Start: 2022-11-08 — End: 2022-11-08
  Administered 2022-11-08: 19 [IU]/h via INTRAVENOUS
  Filled 2022-11-08: qty 100

## 2022-11-08 MED ORDER — ATORVASTATIN CALCIUM 40 MG PO TABS
80.0000 mg | ORAL_TABLET | Freq: Every day | ORAL | Status: DC
  Administered 2022-11-09 – 2022-11-13 (×5): 80 mg via ORAL
  Filled 2022-11-08 (×5): qty 2

## 2022-11-08 MED ORDER — NICOTINE 14 MG/24HR TD PT24
14.0000 mg | MEDICATED_PATCH | Freq: Every day | TRANSDERMAL | Status: DC
  Administered 2022-11-08 – 2022-11-12 (×5): 14 mg via TRANSDERMAL
  Filled 2022-11-08 (×6): qty 1

## 2022-11-08 MED ORDER — POTASSIUM CHLORIDE 10 MEQ/100ML IV SOLN
10.0000 meq | INTRAVENOUS | Status: AC
  Administered 2022-11-08 (×2): 10 meq via INTRAVENOUS
  Filled 2022-11-08 (×2): qty 100

## 2022-11-08 MED ORDER — INSULIN REGULAR(HUMAN) IN NACL 100-0.9 UT/100ML-% IV SOLN
INTRAVENOUS | Status: DC
  Administered 2022-11-08: 19 [IU]/h via INTRAVENOUS
  Administered 2022-11-08: 13 [IU]/h via INTRAVENOUS
  Filled 2022-11-08: qty 100

## 2022-11-08 NOTE — ED Notes (Signed)
Patient transported to ICU by RN.  Monitored during transport

## 2022-11-08 NOTE — ED Provider Notes (Signed)
[place] EMERGENCY DEPARTMENT AT [facility] Provider Note   CSN: [id number] Arrival date & time: 11/08/22  1643     History  Chief Complaint  Patient presents with   Emesis   Nausea    [name] is a 47 y.o. female with HTN, schizoaffective disorder, T2DM on insulin, h/o cholecystitis, bipolar disorder/psychosis who presents with N/V.     Presents with N/V/D that started this morning at 0500. Also reports that she passed out at 1500 PM today. She was sitting on the bed and tried to get up but then fell down. Did not hit her head. Reports that she "kind of" lost consciousness. Felt lightheaded when she stood up but didn't have any chest pain, SOB. +Diaphoresis. States this has happened to her before when her sugar was 500 once. Hasn't taken her insulin in 2 days because she forgot it in [place] when she came here to visit her daughter. Hasn't checked her sugar at home because she lost her machine. +Diarrhea that also started today. Denies abdominal pain, urinary symptoms, hematochezia/melena, vaginal symptoms, leg swelling. She states she was admitted for DKA recently one month ago in [place].   EMS vitals: 416 CBG 194/102 BP 88 HR 100% SPO2 on room air     Emesis      Home Medications Prior to Admission medications   Medication Sig Start Date End Date Taking? Authorizing Provider  ARIPiprazole (ABILIFY) 10 MG tablet Take 10 mg by mouth daily. 01/29/22   [provider]  ARIPiprazole (ABILIFY) 15 MG tablet Take 30 mg by mouth daily.    [provider]  atorvastatin (LIPITOR) 80 MG tablet Take 1 tablet (80 mg total) by mouth daily. Elevated cholesterol Patient not taking: Reported on 02/16/2022 06/01/21 06/01/22  Starleen Blue, NP  benztropine (COGENTIN) 0.5 MG tablet Take 1 tablet (0.5 mg total) by mouth 2 (two) times daily. tremors Patient not taking: Reported on 01/31/2022 06/01/21   Starleen Blue, NP  dapagliflozin propanediol (FARXIGA) 10  MG TABS tablet Take 10 mg by mouth daily. Patient not taking: Reported on 02/16/2022    [provider]  divalproex (DEPAKOTE) 250 MG DR tablet Take 250 mg by mouth 3 (three) times daily.    [provider]  divalproex (DEPAKOTE) 500 MG DR tablet Take 1 tablet (500 mg total) by mouth 2 (two) times daily. Mood disorder Patient not taking: Reported on 02/16/2022 06/01/21   Starleen Blue, NP  docusate sodium (COLACE) 100 MG capsule Take 1 capsule (100 mg total) by mouth daily. Stool softener Patient not taking: Reported on 01/31/2022 06/02/21   Starleen Blue, NP  ferrous sulfate 325 (65 FE) MG tablet Take 1 tablet (325 mg total) by mouth 2 (two) times daily with a meal. Iron supplement Patient not taking: Reported on 01/31/2022 06/01/21   Starleen Blue, NP  hydrOXYzine (ATARAX) 25 MG tablet Take 1 tablet (25 mg total) by mouth 3 (three) times daily as needed for anxiety. For anxiety 06/01/21   Starleen Blue, NP  insulin aspart (NOVOLOG) 100 UNIT/ML injection Inject 0-9 Units into the skin 3 (three) times daily with meals. For elevated blood glucose Patient not taking: Reported on 01/31/2022 06/01/21   Starleen Blue, NP  insulin glargine-yfgn (SEMGLEE) 100 UNIT/ML injection Inject 0.16 mLs (16 Units total) into the skin daily. High blood glucose Patient not taking: Reported on 01/31/2022 06/01/21   Starleen Blue, NP  lisinopril (ZESTRIL) 5 MG tablet Take 1 tablet (5 mg total) by mouth daily.  For high blood pressure Patient not taking: Reported on 02/16/2022 06/01/21 06/01/22  Starleen Blue, NP  LORazepam (ATIVAN) 0.5 MG tablet Take 1 tablet (0.5 mg total) by mouth 2 (two) times daily. For anxiety Patient not taking: Reported on 02/16/2022 06/01/21   Starleen Blue, NP  metFORMIN (GLUCOPHAGE) 1000 MG tablet Take 1 tablet (1,000 mg total) by mouth 2 (two) times daily with a meal. diabetes 06/01/21 06/01/22  Starleen Blue, NP  propranolol (INDERAL) 10 MG tablet Take 1 tablet (10 mg total) by  mouth 2 (two) times daily. For anxiety and hypertension Patient not taking: Reported on 01/31/2022 06/01/21   Starleen Blue, NP  QUEtiapine (SEROQUEL) 50 MG tablet Take 1 tablet (50 mg total) by mouth at bedtime. For mood disorder Patient not taking: Reported on 01/31/2022 06/01/21   Starleen Blue, NP  risperiDONE (RISPERDAL M-TABS) 3 MG disintegrating tablet Take 1 tablet (3 mg total) by mouth daily. Mood disorder. Patient not taking: Reported on 01/31/2022 06/02/21   Starleen Blue, NP  risperiDONE (RISPERDAL M-TABS) 4 MG disintegrating tablet Take 1 tablet (4 mg total) by mouth at bedtime. For mood disorder Patient not taking: Reported on 01/31/2022 06/01/21   Starleen Blue, NP  traZODone (DESYREL) 50 MG tablet Take 1 tablet (50 mg total) by mouth at bedtime as needed for sleep. For sleep 06/01/21   Starleen Blue, NP  VICTOZA 18 MG/3ML SOPN Inject 1.8 mg into the skin daily. Patient not taking: Reported on 02/16/2022 11/25/21   [provider]  Vitamin D3 (VITAMIN D) 25 MCG tablet Take 1 tablet (1,000 Units total) by mouth daily. Vitamin D supplement Patient not taking: Reported on 01/31/2022 06/02/21   Starleen Blue, NP      Allergies    Patient has no known allergies.    Review of Systems   Review of Systems  Gastrointestinal:  Positive for vomiting.   Review of systems Positive for diaphoresis.  A 10 point review of systems was performed and is negative unless otherwise reported in HPI.  Physical Exam Updated Vital Signs BP (!) 149/74   Pulse 80   Temp (!) 97.5 F (36.4 C) (Oral)   Resp 18   SpO2 100%  Physical Exam General: diaphoretic fatigued-appearing female, lying in bed.  HEENT: PERRLA, Sclera anicteric, MMM, trachea midline.  Cardiology: RRR, no murmurs/rubs/gallops. BL radial and DP pulses equal bilaterally.  Resp: Normal respiratory rate and effort. CTAB, no wheezes, rhonchi, crackles.  Abd: Soft, non-tender, non-distended. No rebound tenderness or guarding.  GU:  Deferred. MSK: No peripheral edema or signs of trauma. Extremities without deformity or TTP. No cyanosis or clubbing. Skin: warm, dry. Neuro: A&Ox4, CNs II-XII grossly intact. MAEs. Sensation grossly intact.  Psych: Normal mood and affect.   ED Results / Procedures / Treatments   Labs (all labs ordered are listed, but only abnormal results are displayed) Labs Reviewed  CBC WITH DIFFERENTIAL/PLATELET - Abnormal; Notable for the following components:      Result Value   WBC 15.5 (*)    RBC 5.45 (*)    MCV 75.4 (*)    MCH 23.3 (*)    RDW 17.5 (*)    Platelets 460 (*)    Neutro Abs 14.4 (*)    All other components within normal limits  COMPREHENSIVE METABOLIC PANEL - Abnormal; Notable for the following components:   Sodium 131 (*)    CO2 15 (*)    Glucose, Bld 460 (*)    Total Protein 8.8 (*)  Total Bilirubin 1.3 (*)    Anion gap 17 (*)    All other components within normal limits  BETA-HYDROXYBUTYRIC ACID - Abnormal; Notable for the following components:   Beta-Hydroxybutyric Acid 2.47 (*)    All other components within normal limits  URINALYSIS, ROUTINE W REFLEX MICROSCOPIC - Abnormal; Notable for the following components:   Color, Urine STRAW (*)    Specific Gravity, Urine 1.032 (*)    Glucose, UA >=500 (*)    Ketones, ur 80 (*)    All other components within normal limits  CBG MONITORING, ED - Abnormal; Notable for the following components:   Glucose-Capillary 436 (*)    All other components within normal limits  CBG MONITORING, ED - Abnormal; Notable for the following components:   Glucose-Capillary 425 (*)    All other components within normal limits  MAGNESIUM  BLOOD GAS, VENOUS  I-STAT BETA HCG BLOOD, ED ([facility], [facility], [facility] ONLY)  TROPONIN I (HIGH SENSITIVITY)  TROPONIN I (HIGH SENSITIVITY)    EKG EKG Interpretation  Date/Time:  Wednesday Nov 08 2022 18:01:33 EDT Ventricular Rate:  72 PR Interval:  166 QRS Duration: 86 QT Interval:  417 QTC Calculation: 457 R  Axis:   16 Text Interpretation: Sinus rhythm Confirmed by Vivi Barrack [phone]) on 11/08/2022 7:02:25 PM  Radiology No results found.  Procedures Procedures    Medications Ordered in ED Medications  insulin regular, human (MYXREDLIN) 100 units/ 100 mL infusion (has no administration in time range)  lactated ringers infusion (has no administration in time range)  dextrose 5 % in lactated ringers infusion (has no administration in time range)  dextrose 50 % solution 0-50 mL (has no administration in time range)  potassium chloride 10 mEq in 100 mL IVPB (has no administration in time range)  ondansetron (ZOFRAN) injection 4 mg (4 mg Intravenous Given 11/08/22 1744)  lactated ringers bolus 1,000 mL (0 mLs Intravenous Stopped 11/08/22 1908)    ED Course/ Medical Decision Making/ A&P                          Medical Decision Making Amount and/or Complexity of Data Reviewed Labs: ordered. Decision-making details documented in ED Course.  Risk Prescription drug management. Decision regarding hospitalization.    This patient presents to the ED for concern of N/V, hyperglycemia, syncope; this involves an extensive number of treatment options, and is a complaint that carries with it a high risk of complications and morbidity.  I considered the following differential and admission for this acute, potentially life threatening condition.   MDM:    DDX for syncope includes but is not limited to:  Highest c/f DKA given lack of insulin x 2 days and recnet h/o DKA w/ N/V and hyperglycemia. W/ N/V/D also consider gastroenteritis. She complains of no abdominal pain, abdominal exam w/ no signs acute abdomen or distension, lower c/f acute intraabdominal infectious process such as diverticulitis, appendicitis, or SBO.   For her syncope, consider hypovolemia/dehydration in s/o hyperglycemia/N/V, anemia and electrolyte abnormalities as possible etiologies. Consider arrhythmias and ACS, although without  associated symptoms, less likely. Considered PE as well w/ recent admission, although without CP, hypoxia or sob, or signs/symptoms of DVT. Given history, exam and workup, low suspicion for HF, ICH (no trauma, headache), stroke (no focal neuro deficits), aortic dissection (no chest pain), malignant arrhythmia on ekg, or GI bleed (stable hgb). Will get trop to r/o ACS.    Clinical Course as of 11/08/22  1934  Wed Nov 08, 2022  1901 WBC(!): 15.5 [HN]  1901 Beta-Hydroxybutyric Acid(!): 2.47 [HN]  1901 Ketones, ur(!): 80 D/f DKA. Will start insulin gtt and admit to hospital [HN]  1902 Anion gap(!): 17 [HN]  1903 Potassium: 3.6 Starting some K with fluids [HN]    Clinical Course User Index [HN] Loetta Rough, MD     Labs: I Ordered, and personally interpreted labs.  The pertinent results include:  those listed above  Imaging Studies ordered: I ordered imaging studies including CXR  Additional history obtained from chart review.  External records from outside source obtained and reviewed including [facility]  Cardiac Monitoring: The patient was maintained on a cardiac monitor.  I personally viewed and interpreted the cardiac monitored which showed an underlying rhythm of: NSR  Reevaluation: After the interventions noted above, I reevaluated the patient and found that they have :improved  Social Determinants of Health: Patient lives independently   Disposition:  Pt with DKA, started on insulin/fluids. CXR and trops pending. Admit to hospitalist.   Co morbidities that complicate the patient evaluation  Past Medical History:  Diagnosis Date   Abnormal Pap smear    Bipolar 1 disorder (HCC)    Diabetes mellitus without complication (HCC)    Fibroids    Hypertension    IBS (irritable bowel syndrome)      Medicines Meds ordered this encounter  Medications   ondansetron (ZOFRAN) injection 4 mg   lactated ringers bolus 1,000 mL   insulin regular, human (MYXREDLIN) 100  units/ 100 mL infusion    Order Specific Question:   EndoTool low target:    Answer:   140    Order Specific Question:   EndoTool high target:    Answer:   180    Order Specific Question:   Type of Diabetes    Answer:   Type 2    Order Specific Question:   Mode of Therapy    Answer:   ENDOX1 for DKA    Order Specific Question:   Start Method    Answer:   EndoTool to calculate   lactated ringers infusion   dextrose 5 % in lactated ringers infusion   dextrose 50 % solution 0-50 mL   potassium chloride 10 mEq in 100 mL IVPB    I have reviewed the patients home medicines and have made adjustments as needed  Problem List / ED Course: Problem List Items Addressed This Visit   None Visit Diagnoses     Diabetic ketoacidosis without coma associated with type 2 diabetes mellitus (HCC)    -  Primary   Relevant Medications   insulin regular, human (MYXREDLIN) 100 units/ 100 mL infusion   Syncope and collapse                       This note was created using dictation software, which may contain spelling or grammatical errors.    Loetta Rough, MD 11/08/22 [phone]

## 2022-11-08 NOTE — Assessment & Plan Note (Signed)
In the setting of DKA and dehydration.  Will rehydrate may need orthostatics prior to discharge. Cycle cardiac enzymes obtain echogram

## 2022-11-08 NOTE — Assessment & Plan Note (Signed)
Order nicotine patch

## 2022-11-08 NOTE — Subjective & Objective (Signed)
Patient with history of diabetes type 2 on insulin here visiting her daughter did not take her insulin for the past few days. Patient developed nausea and vomiting in the morning by 3 PM she tried to stand up from sitting down on the bed and fell back down on the bed because she felt generally too weak did not hit her head she did not really lost consciousness but kind of maybe was presyncopal felt lightheaded no associated chest pain or shortness of breath but did have some diaphoresis reports that in the past she had similar presentation and she had elevated blood sugars of 500.  She has lost her machine and has not been checking her blood sugars has been having some diarrhea as well that started today.  But no associated abdominal pain or urinary complaints she reports that she have had prior admissions for DKA on arrival to ER blood pressure 194/102 and CBG 416

## 2022-11-08 NOTE — ED Triage Notes (Signed)
Patient presents due to nausea an vomiting since 0500 today. She is visiting her daughter from [place] and has not had insulin in 2 days. She had an episode of emesis with EMS.    EMS vitals: 416 CBG 194/102 BP 88 HR 100% SPO2 on room air  HX Bipolar, hypertension,  Diabetes type 2

## 2022-11-08 NOTE — Assessment & Plan Note (Signed)
Continue Lipitor 80 mg a day.

## 2022-11-08 NOTE — Assessment & Plan Note (Signed)
will admit per DKA/  obtain serial BMET, start on glucosestabalizer, aggressive IVF.    So far work up of possible causes of DKA/HSS with CXR, ECG one set of cardiac enzymes, UA.  have been unremarkable  Most likely cause been noncompliance Monitor in Stepdown. Replace potassium as needed.      Consult diabetes coordinator

## 2022-11-08 NOTE — ED Notes (Signed)
Patient reports improvement in symptoms.  No current nausea or vomiting.

## 2022-11-08 NOTE — ED Notes (Signed)
ED TO INPATIENT HANDOFF REPORT  Name/Age/Gender [name] 47 y.o. female  Code Status Code Status History     Date Active Date Inactive Code Status Order ID Comments User Context   02/15/2022 2245 02/17/2022 1625 Full Code [id number]  Arthor Captain, PA-C ED   01/31/2022 1232 02/01/2022 2154 Full Code [id number]  Maia Plan, MD ED   05/16/2021 2120 06/01/2021 1737 Full Code [id number]  Rankin, Denice Bors B, NP Inpatient   05/12/2021 1117 05/16/2021 1753 Full Code [id number]  Virgina Norfolk, DO ED   08/17/2020 0740 08/20/2020 1838 Full Code [id number]  Jesse Sans, MD Inpatient   08/13/2020 1456 08/16/2020 2159 Full Code [id number]  Gailen Shelter, PA ED   08/08/2020 0555 08/09/2020 2325 Full Code [id number]  Nira Conn, MD ED   05/18/2018 1947 05/21/2018 2034 Full Code [id number]  Charm Rings, NP Inpatient   05/17/2018 1609 05/18/2018 1640 Full Code [id number]  Raeford Razor, MD ED   02/25/2017 1835 03/05/2017 1501 Full Code [id number]  Charm Rings, NP Inpatient   02/25/2017 0351 02/25/2017 1819 Full Code [id number]  Liberty Handy, PA-C ED   08/04/2016 1946 08/14/2016 1522 Full Code [id number]  Laveda Abbe, NP Inpatient   01/09/2016 2259 01/10/2016 1746 Full Code [id number]  Melton Krebs, PA-C ED   12/17/2015 2252 12/18/2015 1741 Full Code [id number]  Rolland Porter, MD ED       Home/SNF/Other Home  Chief Complaint DKA, type 2 (HCC) [E11.10]  Level of Care/Admitting Diagnosis ED Disposition     ED Disposition  Admit   Condition  --   Comment  Hospital Area: [facility] [[id number]]  Level of Care: Stepdown [14]  Admit to SDU based on following criteria: Other see comments  Comments: insulin drip  May place patient in observation at [facility] or [facility] if equivalent level of care is available:: No  Covid Evaluation: Asymptomatic - no recent exposure (last 10 days) testing not required  Diagnosis: DKA, type 2 [facility]) [[id number]]  Admitting  Physician: Therisa Doyne [[id number]]  Attending Physician: Therisa Doyne [[id number]]          Medical History Past Medical History:  Diagnosis Date   Abnormal Pap smear    Bipolar 1 disorder (HCC)    Diabetes mellitus without complication (HCC)    Fibroids    Hypertension    IBS (irritable bowel syndrome)     Allergies No Known Allergies  IV Location/Drains/Wounds Patient Lines/Drains/Airways Status     Active Line/Drains/Airways     Name Placement date Placement time Site Days   Peripheral IV 11/08/22 20 G Right Antecubital 11/08/22  1740  Antecubital  less than 1   Peripheral IV 11/08/22 Posterior;Right Hand 11/08/22  2010  Hand  less than 1            Labs/Imaging Results for orders placed or performed during the hospital encounter of 11/08/22 (from the past 48 hour(s))  CBC with Differential     Status: Abnormal   Collection Time: 11/08/22  5:49 PM  Result Value Ref Range   WBC 15.5 (H) 4.0 - 10.5 K/uL   RBC 5.45 (H) 3.87 - 5.11 MIL/uL   Hemoglobin 12.7 12.0 - 15.0 g/dL   HCT 01.7 51.0 - 25.8 %   MCV 75.4 (L) 80.0 - 100.0 fL   MCH 23.3 (L) 26.0 - 34.0 pg   MCHC 30.9 30.0 - 36.0 g/dL   RDW 52.7 (H) 78.2 - 42.3 %  Platelets 460 (H) 150 - 400 K/uL   nRBC 0.0 0.0 - 0.2 %   Neutrophils Relative % 94 %   Neutro Abs 14.4 (H) 1.7 - 7.7 K/uL   Lymphocytes Relative 5 %   Lymphs Abs 0.8 0.7 - 4.0 K/uL   Monocytes Relative 1 %   Monocytes Absolute 0.2 0.1 - 1.0 K/uL   Eosinophils Relative 0 %   Eosinophils Absolute 0.0 0.0 - 0.5 K/uL   Basophils Relative 0 %   Basophils Absolute 0.0 0.0 - 0.1 K/uL   Immature Granulocytes 0 %   Abs Immature Granulocytes 0.06 0.00 - 0.07 K/uL    Comment: Performed at [facility], [address], [place], [place] [address]  Comprehensive metabolic panel     Status: Abnormal   Collection Time: 11/08/22  5:49 PM  Result Value Ref Range   Sodium 131 (L) 135 - 145 mmol/L   Potassium 3.6 3.5 - 5.1 mmol/L    Chloride 99 98 - 111 mmol/L   CO2 15 (L) 22 - 32 mmol/L   Glucose, Bld 460 (H) 70 - 99 mg/dL    Comment: Glucose reference range applies only to samples taken after fasting for at least 8 hours.   BUN 13 6 - 20 mg/dL   Creatinine, Ser 6.04 0.44 - 1.00 mg/dL   Calcium 9.7 8.9 - 54.0 mg/dL   Total Protein 8.8 (H) 6.5 - 8.1 g/dL   Albumin 4.0 3.5 - 5.0 g/dL   AST 25 15 - 41 U/L   ALT 19 0 - 44 U/L   Alkaline Phosphatase 107 38 - 126 U/L   Total Bilirubin 1.3 (H) 0.3 - 1.2 mg/dL   GFR, Estimated >98 >11 mL/min    Comment: (NOTE) Calculated using the CKD-EPI Creatinine Equation (2021)    Anion gap 17 (H) 5 - 15    Comment: Performed at [facility], [address], [place], [place] [address]  Beta-hydroxybutyric acid     Status: Abnormal   Collection Time: 11/08/22  5:49 PM  Result Value Ref Range   Beta-Hydroxybutyric Acid 2.47 (H) 0.05 - 0.27 mmol/L    Comment: Performed at [facility], [address], [place], [place] [address]  Urinalysis, Routine w reflex microscopic -Urine, Clean Catch     Status: Abnormal   Collection Time: 11/08/22  5:49 PM  Result Value Ref Range   Color, Urine STRAW (A) YELLOW   APPearance CLEAR CLEAR   Specific Gravity, Urine 1.032 (H) 1.005 - 1.030   pH 5.0 5.0 - 8.0   Glucose, UA >=500 (A) NEGATIVE mg/dL   Hgb urine dipstick NEGATIVE NEGATIVE   Bilirubin Urine NEGATIVE NEGATIVE   Ketones, ur 80 (A) NEGATIVE mg/dL   Protein, ur NEGATIVE NEGATIVE mg/dL   Nitrite NEGATIVE NEGATIVE   Leukocytes,Ua NEGATIVE NEGATIVE   RBC / HPF 0-5 0 - 5 RBC/hpf   WBC, UA 0-5 0 - 5 WBC/hpf   Bacteria, UA NONE SEEN NONE SEEN   Squamous Epithelial / HPF 0-5 0 - 5 /HPF    Comment: Performed at [facility], [address], [place], [place] [address]  I-Stat beta hCG blood, ED ([facility], [facility], [facility] only)     Status: None   Collection Time: 11/08/22  5:55 PM  Result Value Ref Range   I-stat hCG, quantitative <5.0 <5 mIU/mL    Comment 3            Comment:   GEST.  AGE      CONC.  (mIU/mL)   <=1 WEEK        5 - 50     2 WEEKS       50 - 500     3 WEEKS       100 - 10,000     4 WEEKS     1,000 - 30,000        FEMALE AND NON-PREGNANT FEMALE:     LESS THAN 5 mIU/mL   POC CBG, ED     Status: Abnormal   Collection Time: 11/08/22  5:56 PM  Result Value Ref Range   Glucose-Capillary 436 (H) 70 - 99 mg/dL    Comment: Glucose reference range applies only to samples taken after fasting for at least 8 hours.  CBG monitoring, ED     Status: Abnormal   Collection Time: 11/08/22  7:13 PM  Result Value Ref Range   Glucose-Capillary 425 (H) 70 - 99 mg/dL    Comment: Glucose reference range applies only to samples taken after fasting for at least 8 hours.  Blood gas, venous     Status: Abnormal   Collection Time: 11/08/22  8:00 PM  Result Value Ref Range   pH, Ven 7.37 7.25 - 7.43   pCO2, Ven 25 (L) 44 - 60 mmHg   pO2, Ven 58 (H) 32 - 45 mmHg   Bicarbonate 14.5 (L) 20.0 - 28.0 mmol/L   Acid-base deficit 9.0 (H) 0.0 - 2.0 mmol/L   O2 Saturation 88.4 %   Patient temperature 37.0     Comment: Performed at [facility], [address] [place], [place] [address]  CBG monitoring, ED     Status: Abnormal   Collection Time: 11/08/22  8:10 PM  Result Value Ref Range   Glucose-Capillary 433 (H) 70 - 99 mg/dL    Comment: Glucose reference range applies only to samples taken after fasting for at least 8 hours.   No results found.  Pending Labs Unresulted Labs (From admission, onward)     Start     Ordered   11/08/22 2019  Basic metabolic panel  Now then every 4 hours,   R (with STAT occurrences)      11/08/22 2018   11/08/22 1724  Magnesium  Once,   STAT        11/08/22 1723            Vitals/Pain Today's Vitals   11/08/22 1829 11/08/22 1935 11/08/22 1945 11/08/22 2015  BP:   (!) 196/91 (!) 185/86  Pulse: 80  (!) 129 81  Resp: 18  16 18   Temp:      TempSrc:      SpO2: 100%  98% 100%   Weight:  286 lb (129.7 kg)    Height:  6' (1.829 m)      Isolation Precautions No active isolations  Medications Medications  insulin regular, human (MYXREDLIN) 100 units/ 100 mL infusion (25 Units/hr Intravenous Rate/Dose Change 11/08/22 2015)  lactated ringers infusion ( Intravenous New Bag/Given 11/08/22 2012)  dextrose 5 % in lactated ringers infusion (0 mLs Intravenous Hold 11/08/22 1937)  dextrose 50 % solution 0-50 mL (has no administration in time range)  potassium chloride 10 mEq in 100 mL IVPB (10 mEq Intravenous New Bag/Given 11/08/22 2024)  ondansetron (ZOFRAN) injection 4 mg (4 mg Intravenous Given 11/08/22 1744)  lactated ringers bolus 1,000 mL (0 mLs Intravenous Stopped 11/08/22 1908)  metoCLOPramide (REGLAN) injection 10 mg (  10 mg Intravenous Given 11/08/22 2012)    Mobility walks

## 2022-11-08 NOTE — Assessment & Plan Note (Signed)
Restart lisinopril 5 mg a day Labetalol as needed severe hypertension

## 2022-11-08 NOTE — Assessment & Plan Note (Signed)
Patient states she takes Haldol injections otherwise she is unsure what other medications she is taking.  Currently appears to be nonacute

## 2022-11-08 NOTE — H&P (Signed)
[name] ZOX:[id number] DOB: October 15, 1975 DOA: 11/08/2022     PCP: Georganna Skeans, MD   Outpatient Specialists:   Endocrinologist not local  Patient arrived to ER on 11/08/22 at 1643 Referred by Attending Therisa Doyne, MD   Patient coming from:    home    Chief Complaint:    Chief Complaint  Patient presents with   Emesis   Nausea    HPI: [name] is a 47 y.o. female with medical history significant of DM2, hypertension, bipolar disorder    Presented with    nausea vomiting presyncope Patient with history of diabetes type 2 on insulin here visiting her daughter did not take her insulin for the past few days. Patient developed nausea and vomiting in the morning by 3 PM she tried to stand up from sitting down on the bed and fell back down on the bed because she felt generally too weak did not hit her head she did not really lost consciousness but kind of maybe was presyncopal felt lightheaded no associated chest pain or shortness of breath but did have some diaphoresis reports that in the past she had similar presentation and she had elevated blood sugars of 500.  She has lost her machine and has not been checking her blood sugars has been having some diarrhea as well that started today.  But no associated abdominal pain or urinary complaints she reports that she have had prior admissions for DKA on arrival to ER blood pressure 194/102 and CBG 416    Denies significant ETOH intake   Does not smoke  but is vaping  Lab Results  Component Value Date   SARSCOV2NAA NEGATIVE 02/15/2022   SARSCOV2NAA NEGATIVE 01/31/2022   SARSCOV2NAA NEGATIVE 05/22/2021   SARSCOV2NAA NEGATIVE 05/16/2021        Regarding pertinent Chronic problems:    Hyperlipidemia -  on statins Lipitor (atorvastatin) unsure if still compliant Lipid Panel     Component Value Date/Time   CHOL 101 05/17/2021 0624   CHOL 266 (H) 02/25/2020 0836   TRIG 65 05/17/2021 0624   HDL 32 (L) 05/17/2021 0624    HDL 51 02/25/2020 0836   CHOLHDL 3.2 05/17/2021 0624   VLDL 13 05/17/2021 0624   LDLCALC 56 05/17/2021 0624   LDLCALC 191 (H) 02/25/2020 0836   LABVLDL 24 02/25/2020 0836     HTN on lisinopril 5 mg a day      DM 2 -  Lab Results  Component Value Date   HGBA1C 13.6 (H) 01/31/2022   on insulin patient thinks that she is taking NovoLog 30 units twice daily, PO meds   obesity-   BMI Readings from Last 1 Encounters:  11/08/22 38.79 kg/m    Chronic anemia - baseline hg Hemoglobin & Hematocrit  Recent Labs    01/31/22 0314 02/15/22 2255 11/08/22 1749  HGB 11.2* 10.3* 12.7   Iron/TIBC/Ferritin/ %Sat    Component Value Date/Time   IRON 37 02/28/2017 0619   TIBC 441 02/28/2017 0619   FERRITIN 8 (L) 02/28/2017 0619   IRONPCTSAT 8 (L) 02/28/2017 0619      While in ER: Clinical Course as of 11/08/22 2052  Wed Nov 08, 2022  1901 WBC(!): 15.5 [HN]  1901 Beta-Hydroxybutyric Acid(!): 2.47 [HN]  1901 Ketones, ur(!): 80 D/f DKA. Will start insulin gtt and admit to hospital [HN]  1902 Anion gap(!): 17 [HN]  1903 Potassium: 3.6 Starting some K with fluids [HN]    Clinical Course User Index [HN]  Loetta Rough, MD       Lab Orders         CBC with Differential         Comprehensive metabolic panel         Beta-hydroxybutyric acid         Urinalysis, Routine w reflex microscopic -Urine, Clean Catch         Magnesium         Blood gas, venous         Basic metabolic panel         Rapid urine drug screen (hospital performed)         CK         Phosphorus         Osmolality         Osmolality, urine         TSH         POC CBG, ED         I-Stat beta hCG blood, ED ([facility], [facility], [facility] only)         CBG monitoring, ED         CBG monitoring, ED         CBG monitoring, ED       CXR - orderd    Following Medications were ordered in ER: Medications  insulin regular, human (MYXREDLIN) 100 units/ 100 mL infusion (18 Units/hr Intravenous Rate/Dose Change 11/08/22 2048)  lactated  ringers infusion ( Intravenous New Bag/Given 11/08/22 2012)  dextrose 5 % in lactated ringers infusion (0 mLs Intravenous Hold 11/08/22 1937)  dextrose 50 % solution 0-50 mL (has no administration in time range)  potassium chloride 10 mEq in 100 mL IVPB (10 mEq Intravenous New Bag/Given 11/08/22 2024)  ondansetron (ZOFRAN) injection 4 mg (4 mg Intravenous Given 11/08/22 1744)  lactated ringers bolus 1,000 mL (0 mLs Intravenous Stopped 11/08/22 1908)  metoCLOPramide (REGLAN) injection 10 mg (10 mg Intravenous Given 11/08/22 2012)       ED Triage Vitals  Enc Vitals Group     BP 11/08/22 1654 (!) 173/94     Pulse Rate 11/08/22 1654 83     Resp 11/08/22 1654 17     Temp 11/08/22 1654 (!) 97.5 F (36.4 C)     Temp Source 11/08/22 1654 Oral     SpO2 11/08/22 1654 100 %     Weight 11/08/22 1935 286 lb (129.7 kg)     Height 11/08/22 1935 6' (1.829 m)     Head Circumference --      Peak Flow --      Pain Score --      Pain Loc --      Pain Edu? --      Excl. in GC? --   TMAX(24)@     _________________________________________ Significant initial  Findings: Abnormal Labs Reviewed  CBC WITH DIFFERENTIAL/PLATELET - Abnormal; Notable for the following components:      Result Value   WBC 15.5 (*)    RBC 5.45 (*)    MCV 75.4 (*)    MCH 23.3 (*)    RDW 17.5 (*)    Platelets 460 (*)    Neutro Abs 14.4 (*)    All other components within normal limits  COMPREHENSIVE METABOLIC PANEL - Abnormal; Notable for the following components:   Sodium 131 (*)    CO2 15 (*)    Glucose, Bld 460 (*)    Total Protein 8.8 (*)    Total  Bilirubin 1.3 (*)    Anion gap 17 (*)    All other components within normal limits  BETA-HYDROXYBUTYRIC ACID - Abnormal; Notable for the following components:   Beta-Hydroxybutyric Acid 2.47 (*)    All other components within normal limits  URINALYSIS, ROUTINE W REFLEX MICROSCOPIC - Abnormal; Notable for the following components:   Color, Urine STRAW (*)    Specific Gravity, Urine  1.032 (*)    Glucose, UA >=500 (*)    Ketones, ur 80 (*)    All other components within normal limits  BLOOD GAS, VENOUS - Abnormal; Notable for the following components:   pCO2, Ven 25 (*)    pO2, Ven 58 (*)    Bicarbonate 14.5 (*)    Acid-base deficit 9.0 (*)    All other components within normal limits  CBG MONITORING, ED - Abnormal; Notable for the following components:   Glucose-Capillary 436 (*)    All other components within normal limits  CBG MONITORING, ED - Abnormal; Notable for the following components:   Glucose-Capillary 425 (*)    All other components within normal limits  CBG MONITORING, ED - Abnormal; Notable for the following components:   Glucose-Capillary 433 (*)    All other components within normal limits   ________________ Troponin  ordered Cardiac Panel (last 3 results) Recent Labs    11/08/22 2000  TROPONINIHS 2     ECG: Ordered Personally reviewed and interpreted by me showing: HR : 72 Rhythm:  NSR,     no evidence of ischemic changes QTC 457  BNP (last 3 results) No results for input(s): "BNP" in the last 8760 hours.   COVID-19 Labs  No results for input(s): "DDIMER", "FERRITIN", "LDH", "CRP" in the last 72 hours.  Lab Results  Component Value Date   SARSCOV2NAA NEGATIVE 02/15/2022   SARSCOV2NAA NEGATIVE 01/31/2022   SARSCOV2NAA NEGATIVE 05/22/2021   SARSCOV2NAA NEGATIVE 05/16/2021      The recent clinical data is shown below. Vitals:   11/08/22 1935 11/08/22 1945 11/08/22 2015 11/08/22 2045  BP:  (!) 196/91 (!) 185/86 (!) 202/95  Pulse:  (!) 129 81 94  Resp:  16 18 (!) 22  Temp:      TempSrc:      SpO2:  98% 100% 100%  Weight: 129.7 kg     Height: 6' (1.829 m)       WBC     Component Value Date/Time   WBC 15.5 (H) 11/08/2022 1749   LYMPHSABS 0.8 11/08/2022 1749   LYMPHSABS 2.6 02/25/2020 0836   MONOABS 0.2 11/08/2022 1749   EOSABS 0.0 11/08/2022 1749   EOSABS 0.2 02/25/2020 0836   BASOSABS 0.0 11/08/2022 1749    BASOSABS 0.1 02/25/2020 0836      UA   no evidence of UTI      Urine analysis:    Component Value Date/Time   COLORURINE STRAW (A) 11/08/2022 1749   APPEARANCEUR CLEAR 11/08/2022 1749   LABSPEC 1.032 (H) 11/08/2022 1749   PHURINE 5.0 11/08/2022 1749   GLUCOSEU >=500 (A) 11/08/2022 1749   HGBUR NEGATIVE 11/08/2022 1749   BILIRUBINUR NEGATIVE 11/08/2022 1749   BILIRUBINUR negative 04/08/2021 1413   BILIRUBINUR neg 10/11/2015 1440   KETONESUR 80 (A) 11/08/2022 1749   PROTEINUR NEGATIVE 11/08/2022 1749   UROBILINOGEN 0.2 04/08/2021 1413   UROBILINOGEN 0.2 09/15/2012 0000   NITRITE NEGATIVE 11/08/2022 1749   LEUKOCYTESUR NEGATIVE 11/08/2022 1749     ________________________________________________________________  Venous  Blood Gas result:  pH 7.37  Acid-base deficit 9.0 High  mmol/L   pCO2, Ven 25 Low  mmHg O2 Saturation 88.4 %  pO2, Ven 58 High  mmHg      ABG    Component Value Date/Time   HCO3 14.5 (L) 11/08/2022 2000   TCO2 18 (L) 05/06/2021 1003   TCO2 18 (L) 05/06/2021 1003   ACIDBASEDEF 9.0 (H) 11/08/2022 2000   O2SAT 88.4 11/08/2022 2000       __________________________________________________________ Recent Labs  Lab 11/08/22 1749 11/08/22 2000  NA 131*  --   K 3.6  --   CO2 15*  --   GLUCOSE 460*  --   BUN 13  --   CREATININE 0.91  --   CALCIUM 9.7  --   MG  --  1.8    Cr  stable,  Lab Results  Component Value Date   CREATININE 0.91 11/08/2022   CREATININE 0.63 02/15/2022   CREATININE 0.85 01/31/2022    Recent Labs  Lab 11/08/22 1749  AST 25  ALT 19  ALKPHOS 107  BILITOT 1.3*  PROT 8.8*  ALBUMIN 4.0   Lab Results  Component Value Date   CALCIUM 9.7 11/08/2022    Plt: Lab Results  Component Value Date   PLT 460 (H) 11/08/2022       Recent Labs  Lab 11/08/22 1749  WBC 15.5*  NEUTROABS 14.4*  HGB 12.7  HCT 41.1  MCV 75.4*  PLT 460*    HG/HCT  stable,      Component Value Date/Time   HGB 12.7 11/08/2022 1749   HGB  11.9 02/25/2020 0836   HCT 41.1 11/08/2022 1749   HCT 38.5 02/25/2020 0836   MCV 75.4 (L) 11/08/2022 1749   MCV 78 (L) 02/25/2020 0836     _______________________________________________ Hospitalist was called for admission for   Diabetic ketoacidosis without coma associated with type 2 diabetes mellitus  Syncope and collapse    The following Work up has been ordered so far:  Orders Placed This Encounter  Procedures   CBC with Differential   Comprehensive metabolic panel   Beta-hydroxybutyric acid   Urinalysis, Routine w reflex microscopic -Urine, Clean Catch   Magnesium   Blood gas, venous   Basic metabolic panel   Rapid urine drug screen (hospital performed)   CK   Phosphorus   Osmolality   Osmolality, urine   TSH   Diet NPO time specified   ED Cardiac monitoring   Initiate Carrier Fluid Protocol   Notify physician (specify)   If present, discontinue Insulin Pump after IV Insulin is initiated.   Do NOT use lab glucose values in [employer].  If CBG meter reads "Critical High", enter 600.   IV insulin infusion with sufficient glucose should be continued until MD determines acidosis is corrected and places transition orders.   Upon IV fluid bolus completion, place order for STAT BMET (LAB15) and call provider with results.   IV bolus already initiated   Cardiac Monitoring - Continuous Indefinite   Consult to hospitalist   POC CBG, ED   I-Stat beta hCG blood, ED ([facility], [facility], [facility] only)   CBG monitoring, ED   CBG monitoring, ED   CBG monitoring, ED   ED EKG   Insert peripheral IV   Place in observation (patient's expected length of stay will be less than 2 midnights)     OTHER Significant initial  Findings:  labs showing:     DM  labs:  HbA1C: Recent Labs  01/31/22 0314  HGBA1C 13.6*       CBG (last 3)  Recent Labs    11/08/22 1756 11/08/22 1913 11/08/22 2010  GLUCAP 436* 425* 433*          Cultures:    Component Value Date/Time   SDES  10/28/2020  1134    URINE, RANDOM Performed at [facility], [address] [place], [place] [address]    [facility]  10/28/2020 1134    NONE Performed at [facility], [address] [place], [place] [address]    CULT (A) 10/28/2020 1134    50,000 COLONIES/mL STREPTOCOCCUS AGALACTIAE TESTING AGAINST S. AGALACTIAE NOT ROUTINELY PERFORMED DUE TO PREDICTABILITY OF AMP/PEN/VAN SUSCEPTIBILITY. WITHIN MIXED CULTURE Performed at [facility], [address] [place], [place] [address]    REPTSTATUS 10/29/2020 FINAL 10/28/2020 1134     Radiological Exams on Admission: No results found. _______________________________________________________________________________________________________ Latest  Blood pressure (!) 202/95, pulse 94, temperature (!) 97.5 F (36.4 C), temperature source Oral, resp. rate (!) 22, height 6' (1.829 m), weight 129.7 kg, SpO2 100 %.   Vitals  labs and radiology finding personally reviewed  Review of Systems:    Pertinent positives include:   fatigue,  Constitutional:  No weight loss, night sweats, Fevers, chills, weight loss  HEENT:  No headaches, Difficulty swallowing,Tooth/dental problems,Sore throat,  No sneezing, itching, ear ache, nasal congestion, post nasal drip,  Cardio-vascular:  No chest pain, Orthopnea, PND, anasarca, dizziness, palpitations.no Bilateral lower extremity swelling  GI:  No heartburn, indigestion, abdominal pain, nausea, vomiting, diarrhea, change in bowel habits, loss of appetite, melena, blood in stool, hematemesis Resp:  no shortness of breath at rest. No dyspnea on exertion, No excess mucus, no productive cough, No non-productive cough, No coughing up of blood.No change in color of mucus.No wheezing. Skin:  no rash or lesions. No jaundice GU:  no dysuria, change in color of urine, no urgency or frequency. No straining to urinate.  No flank pain.  Musculoskeletal:  No joint pain or no joint swelling. No  decreased range of motion. No back pain.  Psych:  No change in mood or affect. No depression or anxiety. No memory loss.  Neuro: no localizing neurological complaints, no tingling, no weakness, no double vision, no gait abnormality, no slurred speech, no confusion  All systems reviewed and apart from HOPI all are negative _______________________________________________________________________________________________ Past Medical History:   Past Medical History:  Diagnosis Date   Abnormal Pap smear    Bipolar 1 disorder (HCC)    Diabetes mellitus without complication (HCC)    Fibroids    Hypertension    IBS (irritable bowel syndrome)       Past Surgical History:  Procedure Laterality Date   CERVICAL BIOPSY     CHOLECYSTECTOMY     ESSURE TUBAL LIGATION     HIATAL HERNIA REPAIR     TUBAL LIGATION      Social History:  Ambulatory   independently      reports that she has quit smoking. Her smoking use included cigarettes. She has never used smokeless tobacco. She reports that she does not currently use alcohol. She reports that she does not currently use drugs.     Family History:   Family History  Problem Relation Age of Onset   Diabetes Father    Diabetes Paternal Grandmother    Diabetes Maternal Grandmother    Mental illness Cousin    Healthy Mother  ______________________________________________________________________________________________ Allergies: No Known Allergies   Prior to Admission medications   Medication Sig Start Date End Date Taking? Authorizing Provider  atorvastatin (LIPITOR) 80 MG tablet Take 1 tablet (80 mg total) by mouth daily. Elevated cholesterol 06/01/21 11/08/22 Yes Nkwenti, Doris, NP  benztropine (COGENTIN) 0.5 MG tablet Take 1 tablet (0.5 mg total) by mouth 2 (two) times daily. tremors 06/01/21  Yes Starleen Blue, NP  dapagliflozin propanediol (FARXIGA) 10 MG TABS tablet Take 10 mg by mouth daily.   Yes [provider]   hydrOXYzine (ATARAX) 25 MG tablet Take 1 tablet (25 mg total) by mouth 3 (three) times daily as needed for anxiety. For anxiety 06/01/21  Yes Nkwenti, Doris, NP  insulin aspart (NOVOLOG) 100 UNIT/ML injection Inject 0-9 Units into the skin 3 (three) times daily with meals. For elevated blood glucose Patient taking differently: Inject 30 Units into the skin 2 (two) times daily. For elevated blood glucose 06/01/21  Yes Nkwenti, Doris, NP  lisinopril (ZESTRIL) 5 MG tablet Take 1 tablet (5 mg total) by mouth daily. For high blood pressure 06/01/21 11/08/22 Yes Nkwenti, Doris, NP  metFORMIN (GLUCOPHAGE) 1000 MG tablet Take 1 tablet (1,000 mg total) by mouth 2 (two) times daily with a meal. diabetes 06/01/21 11/08/22 Yes Nkwenti, Doris, NP  risperiDONE (RISPERDAL M-TABS) 3 MG disintegrating tablet Take 1 tablet (3 mg total) by mouth daily. Mood disorder. 06/02/21  Yes Starleen Blue, NP  risperiDONE (RISPERDAL M-TABS) 4 MG disintegrating tablet Take 1 tablet (4 mg total) by mouth at bedtime. For mood disorder 06/01/21  Yes Starleen Blue, NP  traZODone (DESYREL) 50 MG tablet Take 1 tablet (50 mg total) by mouth at bedtime as needed for sleep. For sleep 06/01/21  Yes Starleen Blue, NP  propranolol (INDERAL) 10 MG tablet Take 1 tablet (10 mg total) by mouth 2 (two) times daily. For anxiety and hypertension Patient not taking: Reported on 01/31/2022 06/01/21   Starleen Blue, NP  QUEtiapine (SEROQUEL) 50 MG tablet Take 1 tablet (50 mg total) by mouth at bedtime. For mood disorder Patient not taking: Reported on 01/31/2022 06/01/21   Starleen Blue, NP  VICTOZA 18 MG/3ML SOPN Inject 1.8 mg into the skin daily. Patient not taking: Reported on 02/16/2022 11/25/21   [provider]  Vitamin D3 (VITAMIN D) 25 MCG tablet Take 1 tablet (1,000 Units total) by mouth daily. Vitamin D supplement Patient not taking: Reported on 01/31/2022 06/02/21   Starleen Blue, NP     ___________________________________________________________________________________________________ Physical Exam:    11/08/2022    8:45 PM 11/08/2022    8:15 PM 11/08/2022    7:45 PM  Vitals with BMI  Systolic 202 185 161  Diastolic 95 86 91  Pulse 94 81 129     1. General:  in No  Acute distress diaphoretic   Chronically ill   -appearing 2. Psychological: Alert and   Oriented 3. Head/ENT:    Dry Mucous Membranes                          Head Non traumatic, neck supple                           Poor Dentition 4. SKIN: decreased Skin turgor,  Skin clean Dry and intact no rash    5. Heart: Regular rate and rhythm no  Murmur, no Rub or gallop 6. Lungs:  no wheezes or crackles   7.  Abdomen: Soft,  non-tender, Non distended   obese  bowel sounds present 8. Lower extremities: no clubbing, cyanosis, no  edema 9. Neurologically Grossly intact, moving all 4 extremities equally  10. MSK: Normal range of motion    Chart has been reviewed  ______________________________________________________________________________________________  Assessment/Plan 47 y.o. female with medical history significant of DM2, hypertension, bipolar disorder    Admitted for   Diabetic ketoacidosis without coma associated with type 2 diabetes mellitus    Syncope and collapse    Present on Admission:  DKA, type 2 (HCC)  Essential hypertension  Hyperlipidemia  Nicotine abuse  Bipolar disorder (HCC)     DKA, type 2 (HCC) will admit per DKA/  obtain serial BMET, start on glucosestabalizer, aggressive IVF.    So far work up of possible causes of DKA/HSS with CXR, ECG one set of cardiac enzymes, UA.  have been unremarkable  Most likely cause been noncompliance Monitor in Stepdown. Replace potassium as needed.      Consult diabetes coordinator    Postural dizziness with presyncope In the setting of DKA and dehydration.  Will rehydrate may need orthostatics prior to discharge. Cycle cardiac  enzymes obtain echogram  Essential hypertension Restart lisinopril 5 mg a day Labetalol as needed severe hypertension  Hyperlipidemia Continue Lipitor 80 mg a day  Nicotine abuse Order nicotine patch  Bipolar disorder (HCC) Patient states she takes Haldol injections otherwise she is unsure what other medications she is taking.  Currently appears to be nonacute    Other plan as per orders.  DVT prophylaxis:  SCD    Code Status:    Code Status: Prior FULL CODE as per patient   I had personally discussed CODE STATUS with patient   ACP none   Family Communication:   Family not at  Bedside    Diet  Diet Orders (From admission, onward)     Start     Ordered   11/08/22 1903  Diet NPO time specified  Diet effective now        11/08/22 1903            Disposition Plan:      To home once workup is complete and patient is stable  Vomiting take a look at it to make sure following barriers for discharge:                            Electrolytes corrected                               DKA corrected                             Pain controlled with PO medications                                      Consult Orders  (From admission, onward)           Start     Ordered   11/08/22 1904  Consult to hospitalist  Once       Provider:  (Not yet assigned)  Question Answer Comment  Place call to: [facility]   Reason for Consult Admit      11/08/22 1903  Diabetes care coordinator                 Consults called: none   Admission status:  ED Disposition     ED Disposition  Admit   Condition  --   Comment  Hospital Area: [facility] [[id number]]  Level of Care: Stepdown [14]  Admit to SDU based on following criteria: Other see comments  Comments: insulin drip  May place patient in observation at [facility] or [facility] if equivalent level of care is available:: No  Covid Evaluation: Asymptomatic - no  recent exposure (last 10 days) testing not required  Diagnosis: DKA, type 2 [facility]) [[id number]]  Admitting Physician: Therisa Doyne [[id number]]  Attending Physician: Therisa Doyne [[id number]]           Obs      Level of care   stepdown tele indefinitely please discontinue once patient no longer qualifies COVID-19 Labs     [name] 11/08/2022, 9:06 PM    [facility]     after 2 AM please page floor coverage PA If 7AM-7PM, please contact the day team taking care of the patient using [email or web address]

## 2022-11-09 ENCOUNTER — Observation Stay (HOSPITAL_COMMUNITY): Payer: Medicaid Other

## 2022-11-09 DIAGNOSIS — Z91138 Patient's unintentional underdosing of medication regimen for other reason: Secondary | ICD-10-CM | POA: Diagnosis not present

## 2022-11-09 DIAGNOSIS — F319 Bipolar disorder, unspecified: Secondary | ICD-10-CM | POA: Diagnosis present

## 2022-11-09 DIAGNOSIS — R112 Nausea with vomiting, unspecified: Secondary | ICD-10-CM | POA: Diagnosis present

## 2022-11-09 DIAGNOSIS — Z833 Family history of diabetes mellitus: Secondary | ICD-10-CM | POA: Diagnosis not present

## 2022-11-09 DIAGNOSIS — R55 Syncope and collapse: Secondary | ICD-10-CM | POA: Diagnosis present

## 2022-11-09 DIAGNOSIS — D509 Iron deficiency anemia, unspecified: Secondary | ICD-10-CM | POA: Diagnosis present

## 2022-11-09 DIAGNOSIS — E1110 Type 2 diabetes mellitus with ketoacidosis without coma: Secondary | ICD-10-CM | POA: Diagnosis not present

## 2022-11-09 DIAGNOSIS — E1111 Type 2 diabetes mellitus with ketoacidosis with coma: Secondary | ICD-10-CM | POA: Diagnosis not present

## 2022-11-09 DIAGNOSIS — I4711 Inappropriate sinus tachycardia, so stated: Secondary | ICD-10-CM | POA: Diagnosis not present

## 2022-11-09 DIAGNOSIS — R251 Tremor, unspecified: Secondary | ICD-10-CM | POA: Diagnosis present

## 2022-11-09 DIAGNOSIS — I428 Other cardiomyopathies: Secondary | ICD-10-CM | POA: Diagnosis not present

## 2022-11-09 DIAGNOSIS — Z79899 Other long term (current) drug therapy: Secondary | ICD-10-CM | POA: Diagnosis not present

## 2022-11-09 DIAGNOSIS — E860 Dehydration: Secondary | ICD-10-CM | POA: Diagnosis present

## 2022-11-09 DIAGNOSIS — Z7984 Long term (current) use of oral hypoglycemic drugs: Secondary | ICD-10-CM | POA: Diagnosis not present

## 2022-11-09 DIAGNOSIS — Z87891 Personal history of nicotine dependence: Secondary | ICD-10-CM | POA: Diagnosis not present

## 2022-11-09 DIAGNOSIS — R6510 Systemic inflammatory response syndrome (SIRS) of non-infectious origin without acute organ dysfunction: Secondary | ICD-10-CM | POA: Diagnosis present

## 2022-11-09 DIAGNOSIS — E1143 Type 2 diabetes mellitus with diabetic autonomic (poly)neuropathy: Secondary | ICD-10-CM | POA: Diagnosis present

## 2022-11-09 DIAGNOSIS — I10 Essential (primary) hypertension: Secondary | ICD-10-CM | POA: Diagnosis present

## 2022-11-09 DIAGNOSIS — T383X6A Underdosing of insulin and oral hypoglycemic [antidiabetic] drugs, initial encounter: Secondary | ICD-10-CM | POA: Diagnosis present

## 2022-11-09 DIAGNOSIS — E7800 Pure hypercholesterolemia, unspecified: Secondary | ICD-10-CM | POA: Diagnosis present

## 2022-11-09 DIAGNOSIS — E876 Hypokalemia: Secondary | ICD-10-CM | POA: Diagnosis present

## 2022-11-09 DIAGNOSIS — K3184 Gastroparesis: Secondary | ICD-10-CM | POA: Diagnosis present

## 2022-11-09 DIAGNOSIS — Z794 Long term (current) use of insulin: Secondary | ICD-10-CM | POA: Diagnosis not present

## 2022-11-09 DIAGNOSIS — R000 Tachycardia, unspecified: Secondary | ICD-10-CM | POA: Diagnosis not present

## 2022-11-09 DIAGNOSIS — D638 Anemia in other chronic diseases classified elsewhere: Secondary | ICD-10-CM | POA: Diagnosis present

## 2022-11-09 LAB — GLUCOSE, CAPILLARY
Glucose-Capillary: 210 mg/dL — ABNORMAL HIGH (ref 70–99)
Glucose-Capillary: 222 mg/dL — ABNORMAL HIGH (ref 70–99)
Glucose-Capillary: 244 mg/dL — ABNORMAL HIGH (ref 70–99)
Glucose-Capillary: 210 mg/dL — ABNORMAL HIGH (ref 70–99)
Glucose-Capillary: 171 mg/dL — ABNORMAL HIGH (ref 70–99)
Glucose-Capillary: 240 mg/dL — ABNORMAL HIGH (ref 70–99)
Glucose-Capillary: 191 mg/dL — ABNORMAL HIGH (ref 70–99)
Glucose-Capillary: 306 mg/dL — ABNORMAL HIGH (ref 70–99)
Glucose-Capillary: 269 mg/dL — ABNORMAL HIGH (ref 70–99)
Glucose-Capillary: 265 mg/dL — ABNORMAL HIGH (ref 70–99)

## 2022-11-09 LAB — BASIC METABOLIC PANEL
Anion gap: 11 (ref 5–15)
Anion gap: 12 (ref 5–15)
Anion gap: 14 (ref 5–15)
Anion gap: 8 (ref 5–15)
BUN: 12 mg/dL (ref 6–20)
BUN: 13 mg/dL (ref 6–20)
BUN: 14 mg/dL (ref 6–20)
BUN: 15 mg/dL (ref 6–20)
CO2: 16 mmol/L — ABNORMAL LOW (ref 22–32)
CO2: 17 mmol/L — ABNORMAL LOW (ref 22–32)
CO2: 18 mmol/L — ABNORMAL LOW (ref 22–32)
CO2: 19 mmol/L — ABNORMAL LOW (ref 22–32)
Calcium: 9.1 mg/dL (ref 8.9–10.3)
Calcium: 9.2 mg/dL (ref 8.9–10.3)
Calcium: 9.6 mg/dL (ref 8.9–10.3)
Calcium: 9.8 mg/dL (ref 8.9–10.3)
Chloride: 102 mmol/L (ref 98–111)
Chloride: 104 mmol/L (ref 98–111)
Chloride: 106 mmol/L (ref 98–111)
Chloride: 109 mmol/L (ref 98–111)
Creatinine, Ser: 0.6 mg/dL (ref 0.44–1.00)
Creatinine, Ser: 0.61 mg/dL (ref 0.44–1.00)
Creatinine, Ser: 0.72 mg/dL (ref 0.44–1.00)
Creatinine, Ser: 0.77 mg/dL (ref 0.44–1.00)
GFR, Estimated: >60 mL/min (ref >60)
GFR, Estimated: >60 mL/min (ref >60)
GFR, Estimated: >60 mL/min (ref >60)
GFR, Estimated: >60 mL/min (ref >60)
Glucose, Bld: 191 mg/dL — ABNORMAL HIGH (ref 70–99)
Glucose, Bld: 275 mg/dL — ABNORMAL HIGH (ref 70–99)
Glucose, Bld: 276 mg/dL — ABNORMAL HIGH (ref 70–99)
Glucose, Bld: 279 mg/dL — ABNORMAL HIGH (ref 70–99)
Potassium: 3.3 mmol/L — ABNORMAL LOW (ref 3.5–5.1)
Potassium: 3.5 mmol/L (ref 3.5–5.1)
Potassium: 3.6 mmol/L (ref 3.5–5.1)
Potassium: 4.1 mmol/L (ref 3.5–5.1)
Sodium: 131 mmol/L — ABNORMAL LOW (ref 135–145)
Sodium: 134 mmol/L — ABNORMAL LOW (ref 135–145)
Sodium: 135 mmol/L (ref 135–145)
Sodium: 136 mmol/L (ref 135–145)

## 2022-11-09 LAB — MAGNESIUM: Magnesium: 1.8 mg/dL (ref 1.7–2.4)

## 2022-11-09 LAB — BETA-HYDROXYBUTYRIC ACID
Beta-Hydroxybutyric Acid: 0.23 mmol/L (ref 0.05–0.27)
Beta-Hydroxybutyric Acid: 0.19 mmol/L (ref 0.05–0.27)
Beta-Hydroxybutyric Acid: 0.79 mmol/L — ABNORMAL HIGH (ref 0.05–0.27)

## 2022-11-09 LAB — PROCALCITONIN: Procalcitonin: <0.1 ng/mL

## 2022-11-09 LAB — TROPONIN I (HIGH SENSITIVITY)
Troponin I (High Sensitivity): 3 ng/L (ref <18)
Troponin I (High Sensitivity): 3 ng/L (ref <18)

## 2022-11-09 LAB — PHOSPHORUS: Phosphorus: 1.5 mg/dL — ABNORMAL LOW (ref 2.5–4.6)

## 2022-11-09 LAB — HIV ANTIBODY (ROUTINE TESTING W REFLEX): HIV Screen 4th Generation wRfx: NONREACTIVE

## 2022-11-09 MED ORDER — SODIUM CHLORIDE 0.9 % IV BOLUS
1000.0000 mL | Freq: Once | INTRAVENOUS | Status: AC
  Administered 2022-11-09: 1000 mL via INTRAVENOUS

## 2022-11-09 MED ORDER — CHLORPROMAZINE HCL 25 MG PO TABS
25.0000 mg | ORAL_TABLET | Freq: Three times a day (TID) | ORAL | Status: AC | PRN
  Administered 2022-11-09 – 2022-11-10 (×4): 25 mg via ORAL
  Filled 2022-11-09 (×6): qty 1

## 2022-11-09 MED ORDER — METOPROLOL TARTRATE 5 MG/5ML IV SOLN
5.0000 mg | INTRAVENOUS | Status: DC | PRN
  Administered 2022-11-10 (×2): 5 mg via INTRAVENOUS
  Filled 2022-11-09 (×3): qty 5

## 2022-11-09 MED ORDER — HYDRALAZINE HCL 20 MG/ML IJ SOLN
10.0000 mg | Freq: Once | INTRAMUSCULAR | Status: AC
  Administered 2022-11-09: 10 mg via INTRAVENOUS
  Filled 2022-11-09: qty 1

## 2022-11-09 MED ORDER — INSULIN ASPART PROT & ASPART (70-30 MIX) 100 UNIT/ML ~~LOC~~ SUSP
30.0000 [IU] | Freq: Two times a day (BID) | SUBCUTANEOUS | Status: DC
  Administered 2022-11-09: 30 [IU] via SUBCUTANEOUS
  Filled 2022-11-09: qty 10

## 2022-11-09 MED ORDER — POTASSIUM CHLORIDE CRYS ER 20 MEQ PO TBCR
40.0000 meq | EXTENDED_RELEASE_TABLET | Freq: Once | ORAL | Status: AC
  Administered 2022-11-09: 40 meq via ORAL
  Filled 2022-11-09: qty 2

## 2022-11-09 MED ORDER — IBUPROFEN 200 MG PO TABS
400.0000 mg | ORAL_TABLET | Freq: Once | ORAL | Status: AC
  Administered 2022-11-09: 400 mg via ORAL
  Filled 2022-11-09: qty 2

## 2022-11-09 MED ORDER — ONDANSETRON HCL 4 MG/2ML IJ SOLN
4.0000 mg | Freq: Four times a day (QID) | INTRAMUSCULAR | Status: DC | PRN
  Administered 2022-11-09 – 2022-11-11 (×3): 4 mg via INTRAVENOUS
  Filled 2022-11-09 (×3): qty 2

## 2022-11-09 MED ORDER — SODIUM BICARBONATE 650 MG PO TABS
650.0000 mg | ORAL_TABLET | Freq: Three times a day (TID) | ORAL | Status: AC
  Administered 2022-11-09 – 2022-11-11 (×6): 650 mg via ORAL
  Filled 2022-11-09 (×6): qty 1

## 2022-11-09 MED ORDER — RISPERIDONE 1 MG PO TBDP
3.0000 mg | ORAL_TABLET | Freq: Every day | ORAL | Status: DC
  Administered 2022-11-09 – 2022-11-13 (×5): 3 mg via ORAL
  Filled 2022-11-09 (×5): qty 3

## 2022-11-09 MED ORDER — IPRATROPIUM-ALBUTEROL 0.5-2.5 (3) MG/3ML IN SOLN: 3.0000 mL | RESPIRATORY_TRACT | Status: DC | PRN

## 2022-11-09 MED ORDER — ORAL CARE MOUTH RINSE: 15.0000 mL | OROMUCOSAL | Status: DC | PRN

## 2022-11-09 MED ORDER — INSULIN GLARGINE-YFGN 100 UNIT/ML ~~LOC~~ SOLN
15.0000 [IU] | Freq: Every day | SUBCUTANEOUS | Status: DC
  Administered 2022-11-09: 15 [IU] via SUBCUTANEOUS
  Filled 2022-11-09: qty 0.15

## 2022-11-09 MED ORDER — PROCHLORPERAZINE EDISYLATE 10 MG/2ML IJ SOLN
10.0000 mg | Freq: Four times a day (QID) | INTRAMUSCULAR | Status: AC | PRN
  Administered 2022-11-09 – 2022-11-10 (×3): 10 mg via INTRAVENOUS
  Filled 2022-11-09 (×4): qty 2

## 2022-11-09 MED ORDER — RISPERIDONE 1 MG PO TBDP
4.0000 mg | ORAL_TABLET | Freq: Every day | ORAL | Status: DC
  Administered 2022-11-09 – 2022-11-12 (×4): 4 mg via ORAL
  Filled 2022-11-09 (×6): qty 4

## 2022-11-09 MED ORDER — SENNOSIDES-DOCUSATE SODIUM 8.6-50 MG PO TABS: 1.0000 | ORAL_TABLET | Freq: Every evening | ORAL | Status: DC | PRN

## 2022-11-09 MED ORDER — HYDRALAZINE HCL 20 MG/ML IJ SOLN
10.0000 mg | INTRAMUSCULAR | Status: DC | PRN
  Administered 2022-11-09 – 2022-11-12 (×4): 10 mg via INTRAVENOUS
  Filled 2022-11-09 (×3): qty 1

## 2022-11-09 MED ORDER — POTASSIUM CHLORIDE 10 MEQ/100ML IV SOLN
10.0000 meq | Freq: Once | INTRAVENOUS | Status: AC
  Administered 2022-11-09: 10 meq via INTRAVENOUS
  Filled 2022-11-09: qty 100

## 2022-11-09 MED ORDER — SODIUM CHLORIDE 0.9 % IV BOLUS
500.0000 mL | Freq: Once | INTRAVENOUS | Status: AC
  Administered 2022-11-09: 500 mL via INTRAVENOUS

## 2022-11-09 MED ORDER — GUAIFENESIN 100 MG/5ML PO LIQD: 5.0000 mL | ORAL | Status: DC | PRN

## 2022-11-09 MED ORDER — POTASSIUM PHOSPHATES 15 MMOLE/5ML IV SOLN
30.0000 mmol | Freq: Once | INTRAVENOUS | Status: AC
  Administered 2022-11-09: 30 mmol via INTRAVENOUS
  Filled 2022-11-09: qty 10

## 2022-11-09 MED ORDER — TRAZODONE HCL 50 MG PO TABS
50.0000 mg | ORAL_TABLET | Freq: Every evening | ORAL | Status: DC | PRN
  Administered 2022-11-09: 50 mg via ORAL

## 2022-11-09 MED ORDER — HYDROXYZINE HCL 25 MG PO TABS
25.0000 mg | ORAL_TABLET | Freq: Three times a day (TID) | ORAL | Status: DC | PRN
  Administered 2022-11-10 – 2022-11-13 (×6): 25 mg via ORAL
  Filled 2022-11-09 (×6): qty 1

## 2022-11-09 MED ORDER — ACETAMINOPHEN 325 MG PO TABS: 650.0000 mg | ORAL_TABLET | Freq: Four times a day (QID) | ORAL | Status: DC | PRN

## 2022-11-09 MED ORDER — AMLODIPINE BESYLATE 5 MG PO TABS
5.0000 mg | ORAL_TABLET | Freq: Every day | ORAL | Status: DC
  Administered 2022-11-09 – 2022-11-11 (×3): 5 mg via ORAL
  Filled 2022-11-09 (×3): qty 1

## 2022-11-09 MED ORDER — INSULIN ASPART 100 UNIT/ML IJ SOLN
0.0000 [IU] | Freq: Three times a day (TID) | INTRAMUSCULAR | Status: DC
  Administered 2022-11-09: 8 [IU] via SUBCUTANEOUS
  Administered 2022-11-09: 11 [IU] via SUBCUTANEOUS
  Administered 2022-11-10: 8 [IU] via SUBCUTANEOUS
  Administered 2022-11-10 (×2): 3 [IU] via SUBCUTANEOUS
  Administered 2022-11-11: 5 [IU] via SUBCUTANEOUS
  Administered 2022-11-11 (×2): 3 [IU] via SUBCUTANEOUS
  Administered 2022-11-12: 8 [IU] via SUBCUTANEOUS
  Administered 2022-11-12: 5 [IU] via SUBCUTANEOUS

## 2022-11-09 MED ORDER — SODIUM CHLORIDE 0.9 % IV SOLN: INTRAVENOUS | Status: AC

## 2022-11-09 MED ORDER — LISINOPRIL 20 MG PO TABS
20.0000 mg | ORAL_TABLET | Freq: Every day | ORAL | Status: DC
  Administered 2022-11-09 – 2022-11-11 (×3): 20 mg via ORAL
  Filled 2022-11-09 (×2): qty 2
  Filled 2022-11-09: qty 1

## 2022-11-09 MED ORDER — METOPROLOL TARTRATE 5 MG/5ML IV SOLN: 5.0000 mg | INTRAVENOUS | Status: DC | PRN

## 2022-11-09 NOTE — Inpatient Diabetes Management (Signed)
Inpatient Diabetes Program Recommendations  AACE/ADA: New Consensus Statement on Inpatient Glycemic Control (2015)  Target Ranges:  Prepandial:   less than 140 mg/dL      Peak postprandial:   less than 180 mg/dL (1-2 hours)      Critically ill patients:  140 - 180 mg/dL   Lab Results  Component Value Date   GLUCAP 306 (H) 11/09/2022   HGBA1C 11.5 (H) 11/08/2022    Review of Glycemic Control  Latest Reference Range & Units 11/09/22 05:10 11/09/22 06:24 11/09/22 07:30 11/09/22 11:55  Glucose-Capillary 70 - 99 mg/dL 295 (H) 621 (H) 308 (H) 306 (H)  (H): Data is abnormally high Diabetes history: Type 2 DM Outpatient Diabetes medications: Farxiga 10 mg QD, "Novolog" 30 units BID, Metformin 1000 mg BID Current orders for Inpatient glycemic control: Iv insulin to transition to Semglee 15 units QD, Novolog 0-15 units TID  Inpatient Diabetes Program Recommendations:    Hyperglycemia noted due to patient's transition off Iv insulin without basal overlap.  Could consider  changing insulin regimen to Novolog 70/30 20 units BID (to start this evening).    Spoke with patient regarding outpatient diabetes management. Patient states she has been without since coming to visit daughter this week. However, patient cannot tell confirm type of insulin or remember her last dose. Feel 70/30 would be best option moving forward. Reviewed patient's current A1c of 11.5%. Explained what a A1c is and what it measures. Also reviewed goal A1c with patient, importance of good glucose control @ home, and blood sugar goals. Reviewed patho of DKA, need for improved control, missing doses, survival skills, interventions, 70/30, vascular changes and commorbidities.  Patient will need a meter at discharge. Reviewed recommended frequency and when to call MD. Patient reports PCP is in [place]. Encouraged to make appointment.  Admits to drinking sugary beverages. Reviewed alternatives and importance of CHO mindfulness.   Patient has no further questions at this time.   Thanks, Lujean Rave, MSN, RNC-OB Diabetes Coordinator [phone] (8a-5p)

## 2022-11-09 NOTE — Progress Notes (Signed)
PROGRESS NOTE    [name]  ZOX:[id number] DOB: [birth year] DOA: 11/08/2022 PCP: Georganna Skeans, MD   Brief Narrative:  47 year old with history of DM2, HTN, bipolar admitted for nausea, vomiting and presyncope.  Upon arrival to the ED she was noted to be hypertensive and hyperglycemic and diabetic ketoacidosis   Assessment & Plan:  Principal Problem:   DKA, type 2 (HCC) Active Problems:   Bipolar disorder (HCC)   Essential hypertension   Nicotine abuse   Postural dizziness with presyncope   Hyperlipidemia    Diabetic ketoacidosis Diabetes mellitus type 2, insulin-dependent -Initiated DKA protocol. Non compliant with home Insulin, but takes her metformin.  Replete electrolytes as needed.  Will transition to subcu insulin once anion gap is closed. -A1c 11.5.  Will consult diabetic coordinator  Presyncope/dizziness - Suspect from dehydration -TSH-normal.  UDS-THC  Hypokalemia/hypophosphatemia - Repletion  Essential hypertension, uncontrolled - Lisinopril increased to 20 mg.  IV as needed  Anemia of chronic disease - Hemoglobin around baseline   Bipolar disorder - On Seroquel, Risperdal   DVT prophylaxis: SCDs Code Status: Full code Family Communication:   On going DKA management.  Transfer patient to MedSurg floor      Diet Orders (From admission, onward)     Start     Ordered   11/08/22 2133  Diet NPO time specified  (Diabetes Ketoacidosis (DKA))  Diet effective now        11/08/22 2133            Subjective: Patient is doing better.  Tells me she has been noncompliant with her insulin   Examination:  General exam: Appears calm and comfortable  Respiratory system: Clear to auscultation. Respiratory effort normal. Cardiovascular system: S1 & S2 heard, RRR. No JVD, murmurs, rubs, gallops or clicks. No pedal edema. Gastrointestinal system: Abdomen is nondistended, soft and nontender. No organomegaly or masses felt. Normal bowel sounds heard. Central  nervous system: Alert and oriented. No focal neurological deficits. Extremities: Symmetric 5 x 5 power. Skin: No rashes, lesions or ulcers Psychiatry: Judgement and insight appear normal. Mood & affect appropriate.  Objective: Vitals:   11/09/22 0500 11/09/22 0600 11/09/22 0628 11/09/22 0700  BP: (!) 187/72 (!) 193/76    Pulse: 64 75 73 79  Resp: 20 (!) 21 16 19   Temp:      TempSrc:      SpO2: 100% 100% 98% 100%  Weight:      Height:        Intake/Output Summary (Last 24 hours) at 11/09/2022 0724 Last data filed at 11/09/2022 0600 Gross per 24 hour  Intake 2284.41 ml  Output --  Net 2284.41 ml   Filed Weights   11/08/22 1935 11/08/22 2200  Weight: 129.7 kg 121.5 kg    Scheduled Meds:  atorvastatin  80 mg Oral Daily   Chlorhexidine Gluconate Cloth  6 each Topical Daily   lisinopril  5 mg Oral Daily   nicotine  14 mg Transdermal Daily   Continuous Infusions:  dextrose 5% lactated ringers 125 mL/hr at 11/08/22 2353   insulin 14 Units/hr (11/09/22 0600)   lactated ringers Stopped (11/08/22 2137)   lactated ringers 125 mL/hr at 11/08/22 2157    Nutritional status     Body mass index is 36.33 kg/m.  Data Reviewed:   CBC: Recent Labs  Lab 11/08/22 1749  WBC 15.5*  NEUTROABS 14.4*  HGB 12.7  HCT 41.1  MCV 75.4*  PLT 460*   Basic Metabolic Panel: Recent Labs  Lab 11/08/22 1749 11/08/22 2000 11/08/22 2153 11/09/22 0019 11/09/22 0419 11/09/22 0500  NA 131*  --  135 136 135  --   K 3.6  --  3.2* 3.5 3.3*  --   CL 99  --  103 106 109  --   CO2 15*  --  15* 16* 18*  --   GLUCOSE 460*  --  334* 275* 276*  --   BUN 13  --  13 13 14   --   CREATININE 0.91  --  0.94 0.77 0.60  --   CALCIUM 9.7  --  9.7 9.8 9.1  --   MG  --  1.8  --   --   --  1.8  PHOS  --   --  3.9  --   --  1.5*   GFR: Estimated Creatinine Clearance: 129.7 mL/min (by C-G formula based on SCr of 0.6 mg/dL). Liver Function Tests: Recent Labs  Lab 11/08/22 1749  AST 25  ALT 19   ALKPHOS 107  BILITOT 1.3*  PROT 8.8*  ALBUMIN 4.0   No results for input(s): "LIPASE", "AMYLASE" in the last 168 hours. No results for input(s): "AMMONIA" in the last 168 hours. Coagulation Profile: No results for input(s): "INR", "PROTIME" in the last 168 hours. Cardiac Enzymes: Recent Labs  Lab 11/08/22 2153  CKTOTAL 90   BNP (last 3 results) No results for input(s): "PROBNP" in the last 8760 hours. HbA1C: Recent Labs    11/08/22 2153  HGBA1C 11.5*   CBG: Recent Labs  Lab 11/08/22 2344 11/09/22 0052 11/09/22 0144 11/09/22 0247 11/09/22 0351  GLUCAP 247* 244* 210* 222* 210*   Lipid Profile: No results for input(s): "CHOL", "HDL", "LDLCALC", "TRIG", "CHOLHDL", "LDLDIRECT" in the last 72 hours. Thyroid Function Tests: Recent Labs    11/08/22 1749  TSH 0.500   Anemia Panel: No results for input(s): "VITAMINB12", "FOLATE", "FERRITIN", "TIBC", "IRON", "RETICCTPCT" in the last 72 hours. Sepsis Labs: No results for input(s): "PROCALCITON", "LATICACIDVEN" in the last 168 hours.  Recent Results (from the past 240 hour(s))  MRSA Next Gen by PCR, Nasal     Status: None   Collection Time: 11/08/22  9:46 PM   Specimen: Nasal Mucosa; Nasal Swab  Result Value Ref Range Status   MRSA by PCR Next Gen NOT DETECTED NOT DETECTED Final    Comment: (NOTE) The GeneXpert MRSA Assay (FDA approved for NASAL specimens only), is one component of a comprehensive MRSA colonization surveillance program. It is not intended to diagnose MRSA infection nor to guide or monitor treatment for MRSA infections. Test performance is not FDA approved in patients less than 45 years old. Performed at [facility], [address] [place], [place] [address]          Radiology Studies: DG CHEST PORT 1 VIEW  Result Date: 11/08/2022 CLINICAL DATA:  Diabetic ketoacidosis. Nausea and vomiting. EXAM: PORTABLE CHEST 1 VIEW COMPARISON:  05/06/2021 FINDINGS: The cardiomediastinal  contours are normal. The lungs are clear. Pulmonary vasculature is normal. No consolidation, pleural effusion, or pneumothorax. No acute osseous abnormalities are seen. IMPRESSION: No acute chest findings. Electronically Signed   By: Narda Rutherford M.D.   On: 11/08/2022 21:18           LOS: 0 days   Time spent= 35 mins    Shalondra Wunschel Joline Maxcy, MD [facility]  If 7PM-7AM, please contact night-coverage  11/09/2022, 7:24 AM

## 2022-11-09 NOTE — Progress Notes (Signed)
Patient refused ECHO.

## 2022-11-09 NOTE — TOC Initial Note (Signed)
Transition of Care [facility]) - Initial/Assessment Note    Patient Details  Name: [name] MRN: [id number] Date of Birth: [birth year]  [facility] CM/SW Contact:    Adrian Prows, RN Phone Number: 11/09/2022, 10:18 AM  Clinical Narrative:                 Nursing Adm Summary says pt has pt has difficulty obtaining meds d/t transportation; spoke w/ pt in room; pt says she lives in apt w/ her parents in [place]; pt denies IPV, food insecurity, difficulty paying utilities; pt also says she has transportation; pt denies having glasses, dentures, and HA; she says she does not have DME, or home oxygen; pt says she has ACT team for her bipolar; pt says she needs a cbg machine because she has not taken insulin in over 4 years; Dr Nelson Chimes notified via secure chat; No TOC needs.  Expected Discharge Plan: Home/Self Care Barriers to Discharge: Continued Medical Work up   Patient Goals and CMS Choice Patient states their goals for this hospitalization and ongoing recovery are:: home          Expected Discharge Plan and Services   Discharge Planning Services: CM Consult   Living arrangements for the past 2 months: Apartment                                      Prior Living Arrangements/Services Living arrangements for the past 2 months: Apartment Lives with:: Parents Patient language and need for interpreter reviewed:: Yes Do you feel safe going back to the place where you live?: Yes      Need for Family Participation in Patient Care: Yes (Comment) Care giver support system in place?: Yes (comment) Current home services: Other (comment) (n/a) Criminal Activity/Legal Involvement Pertinent to Current Situation/Hospitalization: No - Comment as needed  Activities of Daily Living Home Assistive Devices/Equipment: None ADL Screening (condition at time of admission) Patient's cognitive ability adequate to safely complete daily activities?: Yes Is the patient deaf or  have difficulty hearing?: No Does the patient have difficulty seeing, even when wearing glasses/contacts?: No Does the patient have difficulty concentrating, remembering, or making decisions?: No Patient able to express need for assistance with ADLs?: Yes Does the patient have difficulty dressing or bathing?: No Independently performs ADLs?: Yes (appropriate for developmental age) Does the patient have difficulty walking or climbing stairs?: No Weakness of Legs: None Weakness of Arms/Hands: None  Permission Sought/Granted Permission sought to share information with : Case Manager Permission granted to share information with : Yes, Verbal Permission Granted  Share Information with NAME: Burnard Bunting, RN, CM     Permission granted to share info w Relationship: Rocky Morel (mother) [phone]     Emotional Assessment Appearance:: Appears stated age Attitude/Demeanor/Rapport: Gracious Affect (typically observed): Accepting Orientation: : Oriented to Self, Oriented to Place, Oriented to  Time, Oriented to Situation Alcohol / Substance Use: Not Applicable Psych Involvement: No (comment)  Admission diagnosis:  Syncope and collapse [R55] DKA, type 2 (HCC) [E11.10] Diabetic ketoacidosis without coma associated with type 2 diabetes mellitus (HCC) [E11.10] Patient Active Problem List   Diagnosis Date Noted   DKA, type 2 (HCC) 11/08/2022   Postural dizziness with presyncope 11/08/2022   Hyperlipidemia 11/08/2022   Psychosis (HCC) 01/31/2022   MDD (major depressive disorder), recurrent, severe, 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/16/2021   Candida vaginitis 04/11/2021   Essential hypertension 04/08/2019  Class 2 severe obesity due to excess calories with serious comorbidity and body mass index (BMI) of 39.0 to 39.9 in adult (HCC) 04/08/2019   Nicotine abuse 04/08/2019   Bipolar disorder (HCC) 05/18/2018   Diabetes mellitus (HCC) 08/10/2016   Bipolar disorder, curr episode mixed, severe,  with psychotic features (HCC) 08/07/2016   Cannabis use disorder, moderate, dependence (HCC) 08/07/2016   CHOLECYSTITIS, UNSPECIFIED 06/12/2008   BILIARY COLIC 06/10/2008   CERUMEN IMPACTION, BILATERAL 01/17/2008   PHARYNGITIS, VIRAL 01/17/2008   NECK PAIN 01/17/2008   PCP:  Georganna Skeans, MD Pharmacy:   [id number] - [place], [place] - [address] [place] [address] Phone: [phone] Fax: [phone]     Social Determinants of Health (SDOH) Social History: SDOH Screenings   Food Insecurity: No Food Insecurity (11/09/2022)  Housing: Low Risk  (11/09/2022)  Transportation Needs: No Transportation Needs (11/09/2022)  Utilities: Not At Risk (11/09/2022)  Alcohol Screen: Low Risk  (05/16/2021)  Depression (PHQ2-9): Medium Risk (07/20/2021)  Tobacco Use: Medium Risk (11/08/2022)   SDOH Interventions: Food Insecurity Interventions: Inpatient TOC Housing Interventions: Inpatient TOC Transportation Interventions: Inpatient TOC Utilities Interventions: Inpatient TOC   Readmission Risk Interventions     No data to display

## 2022-11-10 ENCOUNTER — Inpatient Hospital Stay (HOSPITAL_COMMUNITY): Payer: Medicaid Other

## 2022-11-10 DIAGNOSIS — R6510 Systemic inflammatory response syndrome (SIRS) of non-infectious origin without acute organ dysfunction: Secondary | ICD-10-CM

## 2022-11-10 LAB — GLUCOSE, CAPILLARY
Glucose-Capillary: 264 mg/dL — ABNORMAL HIGH (ref 70–99)
Glucose-Capillary: 157 mg/dL — ABNORMAL HIGH (ref 70–99)
Glucose-Capillary: 164 mg/dL — ABNORMAL HIGH (ref 70–99)
Glucose-Capillary: 126 mg/dL — ABNORMAL HIGH (ref 70–99)

## 2022-11-10 LAB — CBC
HCT: 35.7 % — ABNORMAL LOW (ref 36.0–46.0)
Hemoglobin: 10.8 g/dL — ABNORMAL LOW (ref 12.0–15.0)
MCH: 23.1 pg — ABNORMAL LOW (ref 26.0–34.0)
MCHC: 30.3 g/dL (ref 30.0–36.0)
MCV: 76.3 fL — ABNORMAL LOW (ref 80.0–100.0)
Platelets: 360 10*3/uL (ref 150–400)
RBC: 4.68 MIL/uL (ref 3.87–5.11)
RDW: 18.4 % — ABNORMAL HIGH (ref 11.5–15.5)
WBC: 13.4 10*3/uL — ABNORMAL HIGH (ref 4.0–10.5)
nRBC: 0 % (ref 0.0–0.2)

## 2022-11-10 LAB — URINALYSIS, ROUTINE W REFLEX MICROSCOPIC
Bilirubin Urine: NEGATIVE
Glucose, UA: NEGATIVE mg/dL
Ketones, ur: NEGATIVE mg/dL
Nitrite: NEGATIVE
Protein, ur: NEGATIVE mg/dL
Specific Gravity, Urine: 1.008 (ref 1.005–1.030)
pH: 6 (ref 5.0–8.0)

## 2022-11-10 LAB — CULTURE, BLOOD (ROUTINE X 2): Special Requests: ADEQUATE

## 2022-11-10 LAB — BASIC METABOLIC PANEL
Anion gap: 9 (ref 5–15)
BUN: 11 mg/dL (ref 6–20)
CO2: 18 mmol/L — ABNORMAL LOW (ref 22–32)
Calcium: 8.5 mg/dL — ABNORMAL LOW (ref 8.9–10.3)
Chloride: 101 mmol/L (ref 98–111)
Creatinine, Ser: 0.63 mg/dL (ref 0.44–1.00)
GFR, Estimated: >60 mL/min (ref >60)
Glucose, Bld: 306 mg/dL — ABNORMAL HIGH (ref 70–99)
Potassium: 3.8 mmol/L (ref 3.5–5.1)
Sodium: 128 mmol/L — ABNORMAL LOW (ref 135–145)

## 2022-11-10 LAB — MAGNESIUM: Magnesium: 1.8 mg/dL (ref 1.7–2.4)

## 2022-11-10 LAB — LACTIC ACID, PLASMA: Lactic Acid, Venous: 1.7 mmol/L (ref 0.5–1.9)

## 2022-11-10 MED ORDER — IOHEXOL 9 MG/ML PO SOLN
ORAL | Status: AC
  Filled 2022-11-10: qty 1000

## 2022-11-10 MED ORDER — IOHEXOL 9 MG/ML PO SOLN
500.0000 mL | ORAL | Status: AC
  Administered 2022-11-10 (×2): 500 mL via ORAL

## 2022-11-10 MED ORDER — SODIUM CHLORIDE 0.9 % IV BOLUS
250.0000 mL | Freq: Once | INTRAVENOUS | Status: AC
  Administered 2022-11-10: 250 mL via INTRAVENOUS

## 2022-11-10 MED ORDER — INSULIN ASPART PROT & ASPART (70-30 MIX) 100 UNIT/ML ~~LOC~~ SUSP
38.0000 [IU] | Freq: Two times a day (BID) | SUBCUTANEOUS | Status: DC
  Administered 2022-11-10 – 2022-11-11 (×3): 38 [IU] via SUBCUTANEOUS

## 2022-11-10 MED ORDER — IOHEXOL 300 MG/ML  SOLN
100.0000 mL | Freq: Once | INTRAMUSCULAR | Status: AC | PRN
  Administered 2022-11-10: 100 mL via INTRAVENOUS

## 2022-11-10 MED ORDER — BACLOFEN 10 MG PO TABS
10.0000 mg | ORAL_TABLET | Freq: Three times a day (TID) | ORAL | Status: DC | PRN
  Administered 2022-11-10: 10 mg via ORAL
  Filled 2022-11-10: qty 1

## 2022-11-10 NOTE — Progress Notes (Signed)
   11/10/22 2126  Assess: MEWS Score  Temp 99 F (37.2 C)  BP 121/85  MAP (mmHg) 97  Pulse Rate (!) 124  Resp 18  SpO2 99 %  O2 Device Room Air  Assess: MEWS Score  MEWS Temp 0  MEWS Systolic 0  MEWS Pulse 2  MEWS RR 0  MEWS LOC 0  MEWS Score 2  MEWS Score Color Yellow  Assess: if the MEWS score is Yellow or Red  Were vital signs taken at a resting state? Yes  Focused Assessment No change from prior assessment  Does the patient meet 2 or more of the SIRS criteria? No  MEWS guidelines implemented  No, previously yellow, continue vital signs every 4 hours  Notify: Charge Nurse/RN  Name of Charge Nurse/RN Notified Curly Rim, 2nd RN  Provider Notification  Provider Name/Title Virgel Manifold  Date Provider Notified 11/10/22  Time Provider Notified 2124  Method of Notification Page  Notification Reason Other (Comment) (MD notified me bc she has been following patient, aware of MEWS status and condition.)  Provider response See new orders  Date of Provider Response 11/10/22  Time of Provider Response 2124  Notify: Rapid Response  Name of Rapid Response RN Notified Mandy, RN  Date Rapid Response Notified 11/10/22  Time Rapid Response Notified 2246  Assess: SIRS CRITERIA  SIRS Temperature  0  SIRS Pulse 1  SIRS Respirations  0  SIRS WBC 0  SIRS Score Sum  1

## 2022-11-10 NOTE — Progress Notes (Signed)
PROGRESS NOTE    [name]  WNU:[id number] DOB: 08-Jan-1976 DOA: 11/08/2022 PCP: Georganna Skeans, MD   Brief Narrative:  47 year old with history of DM2, HTN, bipolar admitted for nausea, vomiting and presyncope.  Upon arrival to the ED she was noted to be hypertensive and hyperglycemic and diabetic ketoacidosis.  DKA improved, she was transitioned to subcu insulin but started having low-grade fever without any obvious evidence of infection.   Assessment & Plan:  Principal Problem:   DKA, type 2 (HCC) Active Problems:   Bipolar disorder (HCC)   Essential hypertension   Nicotine abuse   Postural dizziness with presyncope   Hyperlipidemia   DKA (diabetic ketoacidosis) (HCC)    Diabetic ketoacidosis Diabetes mellitus type 2, insulin-dependent - DKA has resolved.  Currently on 70/30 insulin, increased to 38 units twice daily.  Continue sliding scale. -A1c 11.5.  Seen by diabetic coordinator  Fever/leukocytosis.  SIRS - Unclear etiology.  UA unremarkable.  Procalcitonin negative.  No obvious URI symptoms.  Due to persistent nausea and vomiting, will get CT abdomen pelvis to rule out any intra-abdominal source  Pseudohyponatremia - Secondary to hyperglycemia.  Blood glucose being managed aggressively  Presyncope/dizziness - Suspect from dehydration -TSH-normal.  UDS-THC  Hypokalemia/hypophosphatemia - Repletion  Essential hypertension, improved - Lisinopril increased to 20 mg.  IV as needed  Anemia of chronic disease, microcytosis - Hemoglobin around baseline.  Will check iron studies, B12   Bipolar disorder - On Seroquel, Risperdal   DVT prophylaxis: SCDs Code Status: Full code Family Communication:   Ongoing management for SIRS      Diet Orders (From admission, onward)     Start     Ordered   11/09/22 0732  Diet Carb Modified Fluid consistency: Thin; Room service appropriate? Yes  Diet effective now       Question Answer Comment  Diet-HS Snack? Nothing    Calorie Level Medium 1600-2000   Fluid consistency: Thin   Room service appropriate? Yes      11/09/22 0731            Subjective: Seen at bedside.  Yesterday patient was refusing quite a bit of care including echocardiogram.  Also refusing oral contrast for CT scan this morning but will try after my discussion with her.  Off-and-on still having feeling of nausea.  Overnight she had developed fever.  Denies any URI symptoms  Examination:  General exam: Appears calm and comfortable  Respiratory system: Clear to auscultation. Respiratory effort normal. Cardiovascular system: S1 & S2 heard, RRR. No JVD, murmurs, rubs, gallops or clicks. No pedal edema. Gastrointestinal system: Abdomen is nondistended, soft and nontender. No organomegaly or masses felt. Normal bowel sounds heard. Central nervous system: Alert and oriented. No focal neurological deficits. Extremities: Symmetric 5 x 5 power. Skin: No rashes, lesions or ulcers Psychiatry: Judgement and insight appear normal. Mood & affect appropriate.  Objective: Vitals:   11/10/22 0400 11/10/22 0500 11/10/22 0600 11/10/22 0700  BP: (!) 151/74 (!) 154/66 (!) 156/64 (!) 122/56  Pulse: 96 97 (!) 104 93  Resp: 15 15 16 15   Temp:      TempSrc:      SpO2: 100% 100% 100% 100%  Weight:      Height:        Intake/Output Summary (Last 24 hours) at 11/10/2022 0731 Last data filed at 11/10/2022 0200 Gross per 24 hour  Intake 265.93 ml  Output --  Net 265.93 ml   Filed Weights   11/08/22 1935 11/08/22 2200  Weight: 129.7 kg 121.5 kg    Scheduled Meds:  amLODipine  5 mg Oral Daily   atorvastatin  80 mg Oral Daily   Chlorhexidine Gluconate Cloth  6 each Topical Daily   insulin aspart  0-15 Units Subcutaneous TID WC   insulin aspart protamine- aspart  38 Units Subcutaneous BID WC   lisinopril  20 mg Oral Daily   nicotine  14 mg Transdermal Daily   risperiDONE  3 mg Oral Daily   risperiDONE  4 mg Oral QHS   sodium bicarbonate   650 mg Oral TID   Continuous Infusions:  sodium chloride 75 mL/hr at 11/09/22 2059    Nutritional status     Body mass index is 36.33 kg/m.  Data Reviewed:   CBC: Recent Labs  Lab 11/08/22 1749 11/10/22 0306  WBC 15.5* 13.4*  NEUTROABS 14.4*  --   HGB 12.7 10.8*  HCT 41.1 35.7*  MCV 75.4* 76.3*  PLT 460* 360   Basic Metabolic Panel: Recent Labs  Lab 11/08/22 2000 11/08/22 2153 11/09/22 0019 11/09/22 0419 11/09/22 0500 11/09/22 0846 11/09/22 1608 11/10/22 0306  NA  --  135 136 135  --  134* 131* 128*  K  --  3.2* 3.5 3.3*  --  3.6 4.1 3.8  CL  --  103 106 109  --  104 102 101  CO2  --  15* 16* 18*  --  19* 17* 18*  GLUCOSE  --  334* 275* 276*  --  191* 279* 306*  BUN  --  13 13 14   --  15 12 11   CREATININE  --  0.94 0.77 0.60  --  0.72 0.61 0.63  CALCIUM  --  9.7 9.8 9.1  --  9.6 9.2 8.5*  MG 1.8  --   --   --  1.8  --   --  1.8  PHOS  --  3.9  --   --  1.5*  --   --   --    GFR: Estimated Creatinine Clearance: 129.7 mL/min (by C-G formula based on SCr of 0.63 mg/dL). Liver Function Tests: Recent Labs  Lab 11/08/22 1749  AST 25  ALT 19  ALKPHOS 107  BILITOT 1.3*  PROT 8.8*  ALBUMIN 4.0   No results for input(s): "LIPASE", "AMYLASE" in the last 168 hours. No results for input(s): "AMMONIA" in the last 168 hours. Coagulation Profile: No results for input(s): "INR", "PROTIME" in the last 168 hours. Cardiac Enzymes: Recent Labs  Lab 11/08/22 2153  CKTOTAL 90   BNP (last 3 results) No results for input(s): "PROBNP" in the last 8760 hours. HbA1C: Recent Labs    11/08/22 2153  HGBA1C 11.5*   CBG: Recent Labs  Lab 11/09/22 0624 11/09/22 0730 11/09/22 1155 11/09/22 1607 11/09/22 2222  GLUCAP 171* 191* 306* 269* 265*   Lipid Profile: No results for input(s): "CHOL", "HDL", "LDLCALC", "TRIG", "CHOLHDL", "LDLDIRECT" in the last 72 hours. Thyroid Function Tests: Recent Labs    11/08/22 1749  TSH 0.500   Anemia Panel: No results for  input(s): "VITAMINB12", "FOLATE", "FERRITIN", "TIBC", "IRON", "RETICCTPCT" in the last 72 hours. Sepsis Labs: Recent Labs  Lab 11/09/22 1609  PROCALCITON <0.10    Recent Results (from the past 240 hour(s))  MRSA Next Gen by PCR, Nasal     Status: None   Collection Time: 11/08/22  9:46 PM   Specimen: Nasal Mucosa; Nasal Swab  Result Value Ref Range Status   MRSA by PCR Next  Gen NOT DETECTED NOT DETECTED Final    Comment: (NOTE) The GeneXpert MRSA Assay (FDA approved for NASAL specimens only), is one component of a comprehensive MRSA colonization surveillance program. It is not intended to diagnose MRSA infection nor to guide or monitor treatment for MRSA infections. Test performance is not FDA approved in patients less than 31 years old. Performed at [facility], [address], [place], [place] [address]   Culture, blood (Routine X 2) w Reflex to ID Panel     Status: None (Preliminary result)   Collection Time: 11/09/22  4:54 PM   Specimen: BLOOD RIGHT ARM  Result Value Ref Range Status   Specimen Description   Final    BLOOD RIGHT ARM Performed at [facility], [address], [place], [place] [address]    Special Requests   Final    BOTTLES DRAWN AEROBIC AND ANAEROBIC Blood Culture adequate volume Performed at [facility], [address] [place], [place] [address]    Culture PENDING  Incomplete   Report Status PENDING  Incomplete  Culture, blood (Routine X 2) w Reflex to ID Panel     Status: None (Preliminary result)   Collection Time: 11/09/22  4:54 PM   Specimen: BLOOD RIGHT HAND  Result Value Ref Range Status   Specimen Description   Final    BLOOD RIGHT HAND Performed at [facility], [address], [place], [place] [address]    Special Requests   Final    BOTTLES DRAWN AEROBIC ONLY Blood Culture adequate volume Performed at [facility], [address] [place], [place] [address]     Culture PENDING  Incomplete   Report Status PENDING  Incomplete         Radiology Studies: DG CHEST PORT 1 VIEW  Result Date: 11/08/2022 CLINICAL DATA:  Diabetic ketoacidosis. Nausea and vomiting. EXAM: PORTABLE CHEST 1 VIEW COMPARISON:  05/06/2021 FINDINGS: The cardiomediastinal contours are normal. The lungs are clear. Pulmonary vasculature is normal. No consolidation, pleural effusion, or pneumothorax. No acute osseous abnormalities are seen. IMPRESSION: No acute chest findings. Electronically Signed   By: Narda Rutherford M.D.   On: 11/08/2022 21:18           LOS: 1 day   Time spent= 35 mins    Katniss Weedman Joline Maxcy, MD [facility]  If 7PM-7AM, please contact night-coverage  11/10/2022, 7:31 AM

## 2022-11-10 NOTE — Inpatient Diabetes Management (Signed)
Inpatient Diabetes Program Recommendations  AACE/ADA: New Consensus Statement on Inpatient Glycemic Control (2015)  Target Ranges:  Prepandial:   less than 140 mg/dL      Peak postprandial:   less than 180 mg/dL (1-2 hours)      Critically ill patients:  140 - 180 mg/dL    Latest Reference Range & Units 11/09/22 05:10 11/09/22 06:24 11/09/22 07:30 11/09/22 11:55 11/09/22 16:07 11/09/22 22:22  Glucose-Capillary 70 - 99 mg/dL 161 (H)  IV Insulin Drip Running 171 (H) 191 (H)  IV Insulin Drip OFF 306 (H)  15 units Semglee @1000   11 units Novolog  269 (H)  8 units Novolog  30 units 70/30 Insulin 265 (H)  (H): Data is abnormally high  Latest Reference Range & Units 11/10/22 07:51  Glucose-Capillary 70 - 99 mg/dL 096 (H)  (H): Data is abnormally high    Home DM Meds: Farxiga 10 mg Daily     "Novolog" 30 units BID      Metformin 1000 mg BID   Current Orders: 70/30 Insulin 38 units BID      Novolog Moderate Correction Scale/ SSI (0-15 units) TID AC    Note 70/30 Insulin increased to 38 units BID this AM    70/30 Insulin likely would be best option moving forward  Patient will need a meter at discharge: Order # [id number]    --Will follow patient during hospitalization--  Ambrose Finland RN, MSN, CDCES Diabetes Coordinator Inpatient Glycemic Control Team Team Pager: [phone] (8a-5p)

## 2022-11-10 NOTE — Progress Notes (Signed)
  Echocardiogram Echo attempted at 0805. Pt is actively vomiting. Will attempt again later.  Milda Smart 11/10/2022, 8:08 AM

## 2022-11-10 NOTE — Progress Notes (Signed)
  Echocardiogram Pt refused echo. Will notify attending and nurse.  Milda Smart 11/10/2022, 1:29 PM

## 2022-11-10 NOTE — Progress Notes (Signed)
Patient informed [name] snurse that she has chest discomfort from her hiccups. This nurse attempted to obtain an ECG to rule out any underlying concerns with the chest discomfort that radiates up and down the front of her chest. Patient refused the ECG and requested a muscle relaxer from the provider. Dr. Nelson Chimes informed of this information.

## 2022-11-10 NOTE — Progress Notes (Signed)
MEWS Progress Note  Patient Details Name: [name] MRN: [id number] DOB: 12-04-1975 Today's Date: 11/10/2022   MEWS Flowsheet Documentation:  Assess: MEWS Score Temp: 98.7 F (37.1 C) BP: 133/88 MAP (mmHg): 99 Pulse Rate: (!) 122 ECG Heart Rate: 98 Resp: 20 Level of Consciousness: Alert SpO2: 99 % O2 Device: Room Air Patient Activity (if Appropriate): In bed FiO2 (%): 21 % Assess: MEWS Score MEWS Temp: 0 MEWS Systolic: 0 MEWS Pulse: 2 MEWS RR: 0 MEWS LOC: 0 MEWS Score: 2 MEWS Score Color: Yellow Assess: SIRS CRITERIA SIRS Temperature : 0 SIRS Respirations : 0 SIRS Pulse: 1 SIRS WBC: 0 SIRS Score Sum : 1 SIRS Temperature : 0 SIRS Pulse: 1 SIRS Respirations : 0 SIRS WBC: 0 SIRS Score Sum : 1 Assess: if the MEWS score is Yellow or Red Were vital signs taken at a resting state?: Yes Focused Assessment: No change from prior assessment Does the patient meet 2 or more of the SIRS criteria?: No MEWS guidelines implemented : Yes, yellow Treat MEWS Interventions: Considered administering scheduled or prn medications/treatments as ordered Take Vital Signs Increase Vital Sign Frequency : Yellow: Q2hr x1, continue Q4hrs until patient remains green for 12hrs Escalate MEWS: Escalate: Yellow: Discuss with charge nurse and consider notifying provider and/or RRT Notify: Charge Nurse/RN Name of Charge Nurse/RN Notified: Artist Provider Notification Provider Name/Title: Dr. Nelson Chimes Date Provider Notified: 11/10/22 Time Provider Notified: 1821 Method of Notification: Page Notification Reason: Change in status (HR 120s-160s) Provider response: See new orders (Repeat Metoprolol) Date of Provider Response: 11/10/22 Time of Provider Response: 1822 Notify: Rapid Response Name of Rapid Response RN Notified: Barb RN Date Rapid Response Notified: 11/10/22 Time Rapid Response Notified: 1825   IV appears to be leaking and other IV occluded.  IVs removed and IV team placed new  IV.   Will repeat metoprolol to determine if previous dose was ineffective due to IV.  HR decrease 115   Cathren Laine 11/10/2022, 6:53 PM

## 2022-11-11 DIAGNOSIS — E1111 Type 2 diabetes mellitus with ketoacidosis with coma: Secondary | ICD-10-CM

## 2022-11-11 LAB — CBC
HCT: 36.9 % (ref 36.0–46.0)
Hemoglobin: 11.3 g/dL — ABNORMAL LOW (ref 12.0–15.0)
MCH: 23 pg — ABNORMAL LOW (ref 26.0–34.0)
MCHC: 30.6 g/dL (ref 30.0–36.0)
MCV: 75.2 fL — ABNORMAL LOW (ref 80.0–100.0)
Platelets: 370 10*3/uL (ref 150–400)
RBC: 4.91 MIL/uL (ref 3.87–5.11)
RDW: 17.9 % — ABNORMAL HIGH (ref 11.5–15.5)
WBC: 10.7 10*3/uL — ABNORMAL HIGH (ref 4.0–10.5)
nRBC: 0 % (ref 0.0–0.2)

## 2022-11-11 LAB — GLUCOSE, CAPILLARY
Glucose-Capillary: 249 mg/dL — ABNORMAL HIGH (ref 70–99)
Glucose-Capillary: 191 mg/dL — ABNORMAL HIGH (ref 70–99)
Glucose-Capillary: 196 mg/dL — ABNORMAL HIGH (ref 70–99)
Glucose-Capillary: 196 mg/dL — ABNORMAL HIGH (ref 70–99)

## 2022-11-11 LAB — BASIC METABOLIC PANEL
Anion gap: 10 (ref 5–15)
BUN: 11 mg/dL (ref 6–20)
CO2: 20 mmol/L — ABNORMAL LOW (ref 22–32)
Calcium: 8.9 mg/dL (ref 8.9–10.3)
Chloride: 101 mmol/L (ref 98–111)
Creatinine, Ser: 0.6 mg/dL (ref 0.44–1.00)
GFR, Estimated: >60 mL/min (ref >60)
Glucose, Bld: 288 mg/dL — ABNORMAL HIGH (ref 70–99)
Potassium: 3.7 mmol/L (ref 3.5–5.1)
Sodium: 131 mmol/L — ABNORMAL LOW (ref 135–145)

## 2022-11-11 LAB — IRON AND TIBC
Iron: 46 ug/dL (ref 28–170)
Saturation Ratios: 9 % — ABNORMAL LOW (ref 10.4–31.8)
TIBC: 489 ug/dL — ABNORMAL HIGH (ref 250–450)
UIBC: 443 ug/dL

## 2022-11-11 LAB — FOLATE: Folate: 15.7 ng/mL (ref >5.9)

## 2022-11-11 LAB — FERRITIN: Ferritin: 16 ng/mL (ref 11–307)

## 2022-11-11 LAB — MAGNESIUM: Magnesium: 1.9 mg/dL (ref 1.7–2.4)

## 2022-11-11 LAB — VITAMIN B12: Vitamin B-12: 517 pg/mL (ref 180–914)

## 2022-11-11 LAB — CULTURE, BLOOD (ROUTINE X 2)

## 2022-11-11 MED ORDER — LISINOPRIL 20 MG PO TABS
40.0000 mg | ORAL_TABLET | Freq: Every day | ORAL | Status: DC
  Administered 2022-11-12 – 2022-11-13 (×2): 40 mg via ORAL
  Filled 2022-11-11 (×2): qty 2

## 2022-11-11 MED ORDER — METOPROLOL TARTRATE 25 MG PO TABS
25.0000 mg | ORAL_TABLET | Freq: Two times a day (BID) | ORAL | Status: DC
  Administered 2022-11-11 (×2): 25 mg via ORAL
  Filled 2022-11-11 (×2): qty 1

## 2022-11-11 MED ORDER — INSULIN ASPART PROT & ASPART (70-30 MIX) 100 UNIT/ML ~~LOC~~ SUSP
43.0000 [IU] | Freq: Two times a day (BID) | SUBCUTANEOUS | Status: DC
  Administered 2022-11-11 – 2022-11-12 (×2): 43 [IU] via SUBCUTANEOUS

## 2022-11-11 MED ORDER — SODIUM CHLORIDE 0.9 % IV SOLN
510.0000 mg | Freq: Once | INTRAVENOUS | Status: AC
  Administered 2022-11-11: 510 mg via INTRAVENOUS
  Filled 2022-11-11: qty 17

## 2022-11-11 MED ORDER — AMLODIPINE BESYLATE 10 MG PO TABS
10.0000 mg | ORAL_TABLET | Freq: Every day | ORAL | Status: DC
  Administered 2022-11-12 – 2022-11-13 (×2): 10 mg via ORAL
  Filled 2022-11-11 (×2): qty 1

## 2022-11-11 MED ORDER — FERROUS SULFATE 325 (65 FE) MG PO TABS
325.0000 mg | ORAL_TABLET | Freq: Every day | ORAL | Status: DC
  Administered 2022-11-12: 325 mg via ORAL
  Filled 2022-11-11: qty 1

## 2022-11-11 MED ORDER — METOCLOPRAMIDE HCL 5 MG/ML IJ SOLN
10.0000 mg | Freq: Three times a day (TID) | INTRAMUSCULAR | Status: DC
  Administered 2022-11-11 – 2022-11-12 (×3): 10 mg via INTRAVENOUS
  Filled 2022-11-11 (×3): qty 2

## 2022-11-11 NOTE — Progress Notes (Signed)
    Patient Name: Guillermo Mccasland           DOB: 1976-05-14  MRN: [id number]      Admission Date: 11/08/2022  Attending Provider: Dimple Nanas, MD  Primary Diagnosis: DKA, type 2 [facility])   Level of care: Telemetry    CROSS COVER NOTE   Date of Service   11/11/2022   Cassundra Shurn, 47 y.o. female, was admitted on 11/08/2022 for DKA, type 2 (HCC).    HPI/Events of Note   HR> 120s.  All other vital signs stable.  Asymptomatic EKG assessed-sinus tachycardia without ST segment changes. No respiratory or cardiac symptoms reported by RN.  Adding fluid bolus for tachycardia, total 500 cc. Nursing staff encouraged to use IV metoprolol for HR> 110.   Interventions/ Plan   Above        Anthoney Harada, DNP, [facility] [place]

## 2022-11-11 NOTE — Progress Notes (Signed)
PROGRESS NOTE    [name]  ZOX:[id number] DOB: [birth year] DOA: 11/08/2022 PCP: Georganna Skeans, MD   Brief Narrative:  47 year old with history of DM2, HTN, bipolar admitted for nausea, vomiting and presyncope.  Upon arrival to the ED she was noted to be hypertensive and hyperglycemic and diabetic ketoacidosis.  DKA improved, she was transitioned to subcu insulin but started having low-grade fever without any obvious evidence of infection.  Intermittent sinus tachycardia, started on metoprolol twice daily   Assessment & Plan:  Principal Problem:   DKA, type 2 (HCC) Active Problems:   Bipolar disorder (HCC)   Essential hypertension   Nicotine abuse   Postural dizziness with presyncope   Hyperlipidemia   DKA (diabetic ketoacidosis) (HCC)    Diabetic ketoacidosis Diabetes mellitus type 2, insulin-dependent - DKA has resolved.  Currently on 70/30 insulin, increased to 43 units twice daily.  Continue sliding scale. -A1c 11.5.  Seen by diabetic coordinator  Fever/leukocytosis.  SIRS - No obvious evidence of infection.  UA, procalcitonin are negative.  No URI symptoms.  CT of the abdomen pelvis does not show any intra-abdominal source.  I suspect her nausea and vomiting could be from gastroparesis.  Would benefit from outpatient gastric emptying study.  Intermittent nausea vomiting - Will start scheduled Reglan.  Outpatient gastric emptying study  Sinus tachycardia, intermittent - Metoprolol twice daily.  IV as needed  Pseudohyponatremia, improved - Secondary to hyperglycemia.  Blood glucose being managed aggressively  Presyncope/dizziness - Suspect from dehydration -TSH-normal.  UDS-THC  Hypokalemia/hypophosphatemia - Repletion  Essential hypertension, improved - Increase lisinopril to 40 mg daily, increase Norvasc 10 mg daily.  IV as needed  Anemia of chronic disease, microcytosis - Hemoglobin around baseline.  Iron deficiency anemia.  Iron supplements and bowel regimen  ordered   Bipolar disorder - On Seroquel, Risperdal   DVT prophylaxis: SCDs Code Status: Full code Family Communication:   Ongoing management for SIRS Still having inappropriate sinus tachycardia     Diet Orders (From admission, onward)     Start     Ordered   11/09/22 0732  Diet Carb Modified Fluid consistency: Thin; Room service appropriate? Yes  Diet effective now       Question Answer Comment  Diet-HS Snack? Nothing   Calorie Level Medium 1600-2000   Fluid consistency: Thin   Room service appropriate? Yes      11/09/22 0731            Subjective: Overall feels okay but complains of intermittent nausea and vomiting.  Also yesterday and the day before having intermittent sinus tachycardia.  Examination:  Constitutional: Not in acute distress Respiratory: Clear to auscultation bilaterally Cardiovascular: Normal sinus rhythm, no rubs Abdomen: Nontender nondistended good bowel sounds Musculoskeletal: No edema noted Skin: No rashes seen Neurologic: CN 2-12 grossly intact.  And nonfocal Psychiatric: Normal judgment and insight. Alert and oriented x 3. Normal mood. Objective: Vitals:   11/10/22 2126 11/11/22 0128 11/11/22 0537 11/11/22 0918  BP: 121/85 (!) 151/86 (!) 172/78 (!) 184/99  Pulse: (!) 124 (!) 119 89 (!) 110  Resp: 18 18 14 20   Temp: 99 F (37.2 C) 98.3 F (36.8 C) 98.6 F (37 C) 98.4 F (36.9 C)  TempSrc: Oral Oral Oral Oral  SpO2: 99% 100% 100% 100%  Weight:      Height:        Intake/Output Summary (Last 24 hours) at 11/11/2022 1052 Last data filed at 11/11/2022 0542 Gross per 24 hour  Intake 981.86 ml  Output --  Net 981.86 ml   Filed Weights   11/08/22 1935 11/08/22 2200 11/10/22 1101  Weight: 129.7 kg 121.5 kg 126 kg    Scheduled Meds:  amLODipine  5 mg Oral Daily   atorvastatin  80 mg Oral Daily   Chlorhexidine Gluconate Cloth  6 each Topical Daily   [START ON 11/12/2022] ferrous sulfate  325 mg Oral Q breakfast   insulin  aspart  0-15 Units Subcutaneous TID WC   insulin aspart protamine- aspart  38 Units Subcutaneous BID WC   lisinopril  20 mg Oral Daily   metoCLOPramide (REGLAN) injection  10 mg Intravenous Q8H   metoprolol tartrate  25 mg Oral BID   nicotine  14 mg Transdermal Daily   risperiDONE  3 mg Oral Daily   risperiDONE  4 mg Oral QHS   sodium bicarbonate  650 mg Oral TID   Continuous Infusions:  ferumoxytol (FERAHEME) 510 mg in sodium chloride 0.9 % 100 mL IVPB      Nutritional status     Body mass index is 37.67 kg/m.  Data Reviewed:   CBC: Recent Labs  Lab 11/08/22 1749 11/10/22 0306 11/11/22 0619  WBC 15.5* 13.4* 10.7*  NEUTROABS 14.4*  --   --   HGB 12.7 10.8* 11.3*  HCT 41.1 35.7* 36.9  MCV 75.4* 76.3* 75.2*  PLT 460* 360 370   Basic Metabolic Panel: Recent Labs  Lab 11/08/22 2000 11/08/22 2153 11/09/22 0019 11/09/22 0419 11/09/22 0500 11/09/22 0846 11/09/22 1608 11/10/22 0306 11/11/22 0619  NA  --  135   < > 135  --  134* 131* 128* 131*  K  --  3.2*   < > 3.3*  --  3.6 4.1 3.8 3.7  CL  --  103   < > 109  --  104 102 101 101  CO2  --  15*   < > 18*  --  19* 17* 18* 20*  GLUCOSE  --  334*   < > 276*  --  191* 279* 306* 288*  BUN  --  13   < > 14  --  15 12 11 11   CREATININE  --  0.94   < > 0.60  --  0.72 0.61 0.63 0.60  CALCIUM  --  9.7   < > 9.1  --  9.6 9.2 8.5* 8.9  MG 1.8  --   --   --  1.8  --   --  1.8 1.9  PHOS  --  3.9  --   --  1.5*  --   --   --   --    < > = values in this interval not displayed.   GFR: Estimated Creatinine Clearance: 132.2 mL/min (by C-G formula based on SCr of 0.6 mg/dL). Liver Function Tests: Recent Labs  Lab 11/08/22 1749  AST 25  ALT 19  ALKPHOS 107  BILITOT 1.3*  PROT 8.8*  ALBUMIN 4.0   No results for input(s): "LIPASE", "AMYLASE" in the last 168 hours. No results for input(s): "AMMONIA" in the last 168 hours. Coagulation Profile: No results for input(s): "INR", "PROTIME" in the last 168 hours. Cardiac  Enzymes: Recent Labs  Lab 11/08/22 2153  CKTOTAL 90   BNP (last 3 results) No results for input(s): "PROBNP" in the last 8760 hours. HbA1C: Recent Labs    11/08/22 2153  HGBA1C 11.5*   CBG: Recent Labs  Lab 11/10/22 0751 11/10/22 1105 11/10/22 1645 11/10/22 2132 11/11/22 8657  GLUCAP 264* 157* 164* 126* 249*   Lipid Profile: No results for input(s): "CHOL", "HDL", "LDLCALC", "TRIG", "CHOLHDL", "LDLDIRECT" in the last 72 hours. Thyroid Function Tests: Recent Labs    11/08/22 1749  TSH 0.500   Anemia Panel: Recent Labs    11/11/22 0619  VITAMINB12 517  FOLATE 15.7  FERRITIN 16  TIBC 489*  IRON 46   Sepsis Labs: Recent Labs  Lab 11/09/22 1609 11/10/22 2146  PROCALCITON <0.10  --   LATICACIDVEN  --  1.7    Recent Results (from the past 240 hour(s))  MRSA Next Gen by PCR, Nasal     Status: None   Collection Time: 11/08/22  9:46 PM   Specimen: Nasal Mucosa; Nasal Swab  Result Value Ref Range Status   MRSA by PCR Next Gen NOT DETECTED NOT DETECTED Final    Comment: (NOTE) The GeneXpert MRSA Assay (FDA approved for NASAL specimens only), is one component of a comprehensive MRSA colonization surveillance program. It is not intended to diagnose MRSA infection nor to guide or monitor treatment for MRSA infections. Test performance is not FDA approved in patients less than 65 years old. Performed at [facility], [address] [place], [place] [address]   Culture, blood (Routine X 2) w Reflex to ID Panel     Status: None (Preliminary result)   Collection Time: 11/09/22  4:54 PM   Specimen: BLOOD RIGHT ARM  Result Value Ref Range Status   Specimen Description   Final    BLOOD RIGHT ARM Performed at [facility], [address], [place], [place] [address]    Special Requests   Final    BOTTLES DRAWN AEROBIC AND ANAEROBIC Blood Culture adequate volume Performed at [facility], [address], [place],  [place] [address]    Culture   Final    NO GROWTH < 12 HOURS Performed at [facility], [address] [place], [place] [address]    Report Status PENDING  Incomplete  Culture, blood (Routine X 2) w Reflex to ID Panel     Status: None (Preliminary result)   Collection Time: 11/09/22  4:54 PM   Specimen: BLOOD RIGHT HAND  Result Value Ref Range Status   Specimen Description   Final    BLOOD RIGHT HAND Performed at [facility], [address] [place], [place] [address]    Special Requests   Final    BOTTLES DRAWN AEROBIC ONLY Blood Culture adequate volume Performed at [facility], [address] [place], [place] [address]    Culture   Final    NO GROWTH < 12 HOURS Performed at [facility], [address] [place], [place] [address]    Report Status PENDING  Incomplete         Radiology Studies: CT ABDOMEN PELVIS W CONTRAST  Result Date: 11/10/2022 CLINICAL DATA:  Abdominal pain.  Acute nonlocalized EXAM: CT ABDOMEN AND PELVIS WITH CONTRAST TECHNIQUE: Multidetector CT imaging of the abdomen and pelvis was performed using the standard protocol following bolus administration of intravenous contrast. RADIATION DOSE REDUCTION: This exam was performed according to the departmental dose-optimization program which includes automated exposure control, adjustment of the mA and/or kV according to patient size and/or use of iterative reconstruction technique. CONTRAST:  OMNIPAQUE IOHEXOL 300 MG/ML  SOLN COMPARISON:  None Available. FINDINGS: Lower chest: Lung bases are clear. Hepatobiliary: No focal hepatic lesion. Postcholecystectomy. No biliary dilatation. Pancreas:  Pancreas is normal. No ductal dilatation. No pancreatic inflammation. Spleen: Normal spleen Adrenals/urinary tract: Adrenal glands normal. 3 mm calculus in the mid RIGHT kidney. No ureterolithiasis or obstructive uropathy. No bladder calculi. Stomach/Bowel: Stomach, small bowel, appendix, and  cecum are normal. The colon and rectosigmoid colon are normal. Vascular/Lymphatic: Abdominal aorta is normal caliber. There is no retroperitoneal or periportal lymphadenopathy. No pelvic lymphadenopathy. Reproductive: Uterus and adnexa unremarkable. Other: No free fluid. Musculoskeletal: No aggressive osseous lesion. IMPRESSION: 1. No acute findings in the abdomen pelvis. 2. Nonobstructing RIGHT renal calculus. Electronically Signed   By: Genevive Bi M.D.   On: 11/10/2022 14:55           LOS: 2 days   Time spent= 35 mins    Embree Brawley Joline Maxcy, MD [facility]  If 7PM-7AM, please contact night-coverage  11/11/2022, 10:52 AM

## 2022-11-12 ENCOUNTER — Inpatient Hospital Stay (HOSPITAL_COMMUNITY): Payer: Medicaid Other

## 2022-11-12 DIAGNOSIS — I428 Other cardiomyopathies: Secondary | ICD-10-CM

## 2022-11-12 LAB — GLUCOSE, CAPILLARY
Glucose-Capillary: 221 mg/dL — ABNORMAL HIGH (ref 70–99)
Glucose-Capillary: 261 mg/dL — ABNORMAL HIGH (ref 70–99)
Glucose-Capillary: 96 mg/dL (ref 70–99)
Glucose-Capillary: 138 mg/dL — ABNORMAL HIGH (ref 70–99)

## 2022-11-12 LAB — BASIC METABOLIC PANEL
Anion gap: 12 (ref 5–15)
BUN: 9 mg/dL (ref 6–20)
CO2: 19 mmol/L — ABNORMAL LOW (ref 22–32)
Calcium: 8.9 mg/dL (ref 8.9–10.3)
Chloride: 101 mmol/L (ref 98–111)
Creatinine, Ser: 0.6 mg/dL (ref 0.44–1.00)
GFR, Estimated: >60 mL/min (ref >60)
Glucose, Bld: 197 mg/dL — ABNORMAL HIGH (ref 70–99)
Potassium: 3.5 mmol/L (ref 3.5–5.1)
Sodium: 132 mmol/L — ABNORMAL LOW (ref 135–145)

## 2022-11-12 LAB — ECHOCARDIOGRAM COMPLETE
AR max vel: 2.49 cm2
AV Area VTI: 2.36 cm2
AV Area mean vel: 2.16 cm2
AV Mean grad: 4 mmHg
AV Peak grad: 7.1 mmHg
Ao pk vel: 1.33 m/s
Area-P 1/2: 4.77 cm2
Est EF: 55
Height: 72 in
S' Lateral: 2.8 cm
Weight: 4444.47 oz

## 2022-11-12 LAB — CBC
HCT: 36.5 % (ref 36.0–46.0)
Hemoglobin: 11.1 g/dL — ABNORMAL LOW (ref 12.0–15.0)
MCH: 22.9 pg — ABNORMAL LOW (ref 26.0–34.0)
MCHC: 30.4 g/dL (ref 30.0–36.0)
MCV: 75.3 fL — ABNORMAL LOW (ref 80.0–100.0)
Platelets: 379 10*3/uL (ref 150–400)
RBC: 4.85 MIL/uL (ref 3.87–5.11)
RDW: 17.8 % — ABNORMAL HIGH (ref 11.5–15.5)
WBC: 9.7 10*3/uL (ref 4.0–10.5)
nRBC: 0 % (ref 0.0–0.2)

## 2022-11-12 LAB — CULTURE, BLOOD (ROUTINE X 2): Culture: NO GROWTH

## 2022-11-12 MED ORDER — METOPROLOL TARTRATE 50 MG PO TABS
50.0000 mg | ORAL_TABLET | Freq: Two times a day (BID) | ORAL | Status: DC
  Administered 2022-11-12 – 2022-11-13 (×3): 50 mg via ORAL
  Filled 2022-11-12 (×3): qty 1

## 2022-11-12 MED ORDER — INSULIN ASPART PROT & ASPART (70-30 MIX) 100 UNIT/ML ~~LOC~~ SUSP
50.0000 [IU] | Freq: Two times a day (BID) | SUBCUTANEOUS | Status: DC
  Administered 2022-11-12: 50 [IU] via SUBCUTANEOUS

## 2022-11-12 MED ORDER — INSULIN ASPART PROT & ASPART (70-30 MIX) 100 UNIT/ML ~~LOC~~ SUSP: 50.0000 [IU] | Freq: Two times a day (BID) | SUBCUTANEOUS | Status: DC

## 2022-11-12 MED ORDER — SENNOSIDES-DOCUSATE SODIUM 8.6-50 MG PO TABS: 2.0000 | ORAL_TABLET | Freq: Two times a day (BID) | ORAL | Status: DC | PRN

## 2022-11-12 MED ORDER — POLYETHYLENE GLYCOL 3350 17 G PO PACK
17.0000 g | PACK | Freq: Every day | ORAL | Status: DC
  Administered 2022-11-12 – 2022-11-13 (×2): 17 g via ORAL
  Filled 2022-11-12 (×2): qty 1

## 2022-11-12 MED ORDER — SODIUM CHLORIDE 0.9 % IV BOLUS
1000.0000 mL | Freq: Once | INTRAVENOUS | Status: AC
  Administered 2022-11-12: 1000 mL via INTRAVENOUS

## 2022-11-12 MED ORDER — DOCUSATE SODIUM 100 MG PO CAPS
100.0000 mg | ORAL_CAPSULE | Freq: Two times a day (BID) | ORAL | Status: DC
  Administered 2022-11-12 – 2022-11-13 (×3): 100 mg via ORAL
  Filled 2022-11-12 (×3): qty 1

## 2022-11-12 MED ORDER — METOCLOPRAMIDE HCL 5 MG/ML IJ SOLN: 10.0000 mg | Freq: Three times a day (TID) | INTRAMUSCULAR | Status: DC

## 2022-11-12 MED ORDER — METOCLOPRAMIDE HCL 10 MG PO TABS
10.0000 mg | ORAL_TABLET | Freq: Three times a day (TID) | ORAL | Status: DC
  Administered 2022-11-12 – 2022-11-13 (×3): 10 mg via ORAL
  Filled 2022-11-12 (×3): qty 1

## 2022-11-12 MED ORDER — PERFLUTREN LIPID MICROSPHERE
1.0000 mL | INTRAVENOUS | Status: AC | PRN
  Administered 2022-11-12: 2 mL via INTRAVENOUS

## 2022-11-12 NOTE — Plan of Care (Signed)
  Problem: Coping: Goal: Ability to adjust to condition or change in health will improve Outcome: Progressing   Problem: Skin Integrity: Goal: Risk for impaired skin integrity will decrease Outcome: Progressing   Problem: Clinical Measurements: Goal: Ability to maintain clinical measurements within normal limits will improve Outcome: Progressing Goal: Will remain free from infection Outcome: Progressing Goal: Diagnostic test results will improve Outcome: Progressing Goal: Respiratory complications will improve Outcome: Progressing Goal: Cardiovascular complication will be avoided Outcome: Progressing   Problem: Coping: Goal: Level of anxiety will decrease Outcome: Progressing   Problem: Elimination: Goal: Will not experience complications related to bowel motility Outcome: Progressing Goal: Will not experience complications related to urinary retention Outcome: Progressing   Problem: Pain Managment: Goal: General experience of comfort will improve Outcome: Progressing   Problem: Safety: Goal: Ability to remain free from injury will improve Outcome: Progressing

## 2022-11-12 NOTE — Progress Notes (Addendum)
PROGRESS NOTE    [name]  ZHY:[id number] DOB: June 17, 1976 DOA: 11/08/2022 PCP: Georganna Skeans, MD   Brief Narrative:  47 year old with history of DM2, HTN, bipolar admitted for nausea, vomiting and presyncope.  Upon arrival to the ED she was noted to be hypertensive and hyperglycemic and diabetic ketoacidosis.  DKA improved, she was transitioned to subcu insulin but started having low-grade fever without any obvious evidence of infection.  Intermittent sinus tachycardia, started on metoprolol twice daily which was uptitrated.  Nausea vomiting suspected secondary to gastroparesis but for now Reglan seems to be working therefore we will continue.   Assessment & Plan:  Principal Problem:   DKA, type 2 (HCC) Active Problems:   Bipolar disorder (HCC)   Essential hypertension   Nicotine abuse   Postural dizziness with presyncope   Hyperlipidemia   DKA (diabetic ketoacidosis) (HCC)    Diabetic ketoacidosis Diabetes mellitus type 2, insulin-dependent - DKA has resolved.  Currently on 70/30 insulin, increased to 50 units twice daily.  Continue sliding scale. -A1c 11.5.  Seen by diabetic coordinator  Fever/leukocytosis.  SIRS - No obvious evidence of infection.  UA, procalcitonin are negative.  No URI symptoms.  CT of the abdomen pelvis does not show any intra-abdominal source.  I suspect her nausea and vomiting could be from gastroparesis.  Would benefit from outpatient gastric emptying study.  Intermittent nausea vomiting - Will try and transition Reglan to p.o. as it seems to be working for her.  Will need outpatient gastric emptying study  Sinus tachycardia, intermittent - IV Lopressor as needed.  Uptitrate low metoprolol 50 mg twice daily TSH normal.  1 L normal saline bolus Will order Echo, spoke with Dr Elease Hashimoto, if abnormal cardiology will be available for a full consult.   Pseudohyponatremia, improved - Secondary to hyperglycemia.  Blood glucose being managed  aggressively  Presyncope/dizziness - Suspect from dehydration -TSH-normal.  UDS-THC  Hypokalemia/hypophosphatemia - Repletion  Essential hypertension, improved - Lisinopril 40 mg daily, Norvasc 10 mg daily.  Increased metoprolol.  Continue IV as needed.  Anemia of chronic disease, microcytosis - Hemoglobin around baseline.  Iron deficiency anemia.  Iron supplements and bowel regimen ordered   Bipolar disorder - On Seroquel, Risperdal   DVT prophylaxis: SCDs Code Status: Full code Family Communication:   Ongoing management for SIRS Still having inappropriate sinus tachycardia     Diet Orders (From admission, onward)     Start     Ordered   11/09/22 0732  Diet Carb Modified Fluid consistency: Thin; Room service appropriate? Yes  Diet effective now       Question Answer Comment  Diet-HS Snack? Nothing   Calorie Level Medium 1600-2000   Fluid consistency: Thin   Room service appropriate? Yes      11/09/22 0731            Subjective: Patient tells me her nausea vomiting has improved with use of Reglan.  Heart rate still jumps up frequently into 130s-140s and appears to be sinus.  At times she feels palpitation but no other symptoms  Examination:  Constitutional: Not in acute distress Respiratory: Clear to auscultation bilaterally Cardiovascular: Sinus tachycardia.  Telemetry was showing 140 heart rate Abdomen: Nontender nondistended good bowel sounds Musculoskeletal: No edema noted Skin: No rashes seen Neurologic: CN 2-12 grossly intact.  And nonfocal Psychiatric: Normal judgment and insight. Alert and oriented x 3. Normal mood. Objective: Vitals:   11/11/22 0918 11/11/22 1312 11/11/22 2058 11/12/22 0551  BP: (!) 184/99 (!) 159/98 Marland Kitchen)  172/89 (!) 182/96  Pulse: (!) 110 92 (!) 103 92  Resp: 20 20 19 16   Temp: 98.4 F (36.9 C) 98.8 F (37.1 C) 99.2 F (37.3 C) 98.7 F (37.1 C)  TempSrc: Oral Oral Oral Oral  SpO2: 100% 99% 95% 96%  Weight:      Height:         Intake/Output Summary (Last 24 hours) at 11/12/2022 1150 Last data filed at 11/12/2022 1022 Gross per 24 hour  Intake 1320 ml  Output --  Net 1320 ml   Filed Weights   11/08/22 1935 11/08/22 2200 11/10/22 1101  Weight: 129.7 kg 121.5 kg 126 kg    Scheduled Meds:  amLODipine  10 mg Oral Daily   atorvastatin  80 mg Oral Daily   docusate sodium  100 mg Oral BID   ferrous sulfate  325 mg Oral Q breakfast   insulin aspart  0-15 Units Subcutaneous TID WC   insulin aspart protamine- aspart  50 Units Subcutaneous BID WC   lisinopril  40 mg Oral Daily   metoCLOPramide  10 mg Oral TID AC   Or   metoCLOPramide (REGLAN) injection  10 mg Intravenous TID AC   metoprolol tartrate  50 mg Oral BID   nicotine  14 mg Transdermal Daily   polyethylene glycol  17 g Oral Daily   risperiDONE  3 mg Oral Daily   risperiDONE  4 mg Oral QHS   Continuous Infusions:    Nutritional status     Body mass index is 37.67 kg/m.  Data Reviewed:   CBC: Recent Labs  Lab 11/08/22 1749 11/10/22 0306 11/11/22 0619 11/12/22 0618  WBC 15.5* 13.4* 10.7* 9.7  NEUTROABS 14.4*  --   --   --   HGB 12.7 10.8* 11.3* 11.1*  HCT 41.1 35.7* 36.9 36.5  MCV 75.4* 76.3* 75.2* 75.3*  PLT 460* 360 370 379   Basic Metabolic Panel: Recent Labs  Lab 11/08/22 2000 11/08/22 2153 11/09/22 0019 11/09/22 0500 11/09/22 0846 11/09/22 1608 11/10/22 0306 11/11/22 0619 11/12/22 0618  NA  --  135   < >  --  134* 131* 128* 131* 132*  K  --  3.2*   < >  --  3.6 4.1 3.8 3.7 3.5  CL  --  103   < >  --  104 102 101 101 101  CO2  --  15*   < >  --  19* 17* 18* 20* 19*  GLUCOSE  --  334*   < >  --  191* 279* 306* 288* 197*  BUN  --  13   < >  --  15 12 11 11 9   CREATININE  --  0.94   < >  --  0.72 0.61 0.63 0.60 0.60  CALCIUM  --  9.7   < >  --  9.6 9.2 8.5* 8.9 8.9  MG 1.8  --   --  1.8  --   --  1.8 1.9  --   PHOS  --  3.9  --  1.5*  --   --   --   --   --    < > = values in this interval not displayed.    GFR: Estimated Creatinine Clearance: 132.2 mL/min (by C-G formula based on SCr of 0.6 mg/dL). Liver Function Tests: Recent Labs  Lab 11/08/22 1749  AST 25  ALT 19  ALKPHOS 107  BILITOT 1.3*  PROT 8.8*  ALBUMIN 4.0  No results for input(s): "LIPASE", "AMYLASE" in the last 168 hours. No results for input(s): "AMMONIA" in the last 168 hours. Coagulation Profile: No results for input(s): "INR", "PROTIME" in the last 168 hours. Cardiac Enzymes: Recent Labs  Lab 11/08/22 2153  CKTOTAL 90   BNP (last 3 results) No results for input(s): "PROBNP" in the last 8760 hours. HbA1C: No results for input(s): "HGBA1C" in the last 72 hours.  CBG: Recent Labs  Lab 11/11/22 1201 11/11/22 1559 11/11/22 2058 11/12/22 0730 11/12/22 1143  GLUCAP 191* 196* 196* 221* 261*   Lipid Profile: No results for input(s): "CHOL", "HDL", "LDLCALC", "TRIG", "CHOLHDL", "LDLDIRECT" in the last 72 hours. Thyroid Function Tests: No results for input(s): "TSH", "T4TOTAL", "FREET4", "T3FREE", "THYROIDAB" in the last 72 hours.  Anemia Panel: Recent Labs    11/11/22 0619  VITAMINB12 517  FOLATE 15.7  FERRITIN 16  TIBC 489*  IRON 46   Sepsis Labs: Recent Labs  Lab 11/09/22 1609 11/10/22 2146  PROCALCITON <0.10  --   LATICACIDVEN  --  1.7    Recent Results (from the past 240 hour(s))  MRSA Next Gen by PCR, Nasal     Status: None   Collection Time: 11/08/22  9:46 PM   Specimen: Nasal Mucosa; Nasal Swab  Result Value Ref Range Status   MRSA by PCR Next Gen NOT DETECTED NOT DETECTED Final    Comment: (NOTE) The GeneXpert MRSA Assay (FDA approved for NASAL specimens only), is one component of a comprehensive MRSA colonization surveillance program. It is not intended to diagnose MRSA infection nor to guide or monitor treatment for MRSA infections. Test performance is not FDA approved in patients less than 61 years old. Performed at [facility], [address], [place], [place] [address]   Culture, blood (Routine X 2) w Reflex to ID Panel     Status: None (Preliminary result)   Collection Time: 11/09/22  4:54 PM   Specimen: BLOOD RIGHT ARM  Result Value Ref Range Status   Specimen Description   Final    BLOOD RIGHT ARM Performed at [facility], [address], [place], [place] [address]    Special Requests   Final    BOTTLES DRAWN AEROBIC AND ANAEROBIC Blood Culture adequate volume Performed at [facility], [address], [place], [place] [address]    Culture   Final    NO GROWTH 2 DAYS Performed at [facility], [address], [place], [place] [address]    Report Status PENDING  Incomplete  Culture, blood (Routine X 2) w Reflex to ID Panel     Status: None (Preliminary result)   Collection Time: 11/09/22  4:54 PM   Specimen: BLOOD RIGHT HAND  Result Value Ref Range Status   Specimen Description   Final    BLOOD RIGHT HAND Performed at [facility], [address], [place], [place] [address]    Special Requests   Final    BOTTLES DRAWN AEROBIC ONLY Blood Culture adequate volume Performed at [facility], [address] [place], [place] [address]    Culture   Final    NO GROWTH 2 DAYS Performed at [facility], [address], [place], [place] [address]    Report Status PENDING  Incomplete         Radiology Studies: CT ABDOMEN PELVIS W CONTRAST  Result Date: 11/10/2022 CLINICAL DATA:  Abdominal pain.  Acute nonlocalized EXAM: CT  ABDOMEN AND PELVIS WITH CONTRAST TECHNIQUE: Multidetector CT imaging of the abdomen and pelvis was performed using the standard protocol following bolus administration of intravenous contrast. RADIATION DOSE REDUCTION: This exam was performed according to the departmental dose-optimization program which includes automated exposure control, adjustment of the mA and/or kV according to patient size and/or use of iterative reconstruction  technique. CONTRAST:  OMNIPAQUE IOHEXOL 300 MG/ML  SOLN COMPARISON:  None Available. FINDINGS: Lower chest: Lung bases are clear. Hepatobiliary: No focal hepatic lesion. Postcholecystectomy. No biliary dilatation. Pancreas: Pancreas is normal. No ductal dilatation. No pancreatic inflammation. Spleen: Normal spleen Adrenals/urinary tract: Adrenal glands normal. 3 mm calculus in the mid RIGHT kidney. No ureterolithiasis or obstructive uropathy. No bladder calculi. Stomach/Bowel: Stomach, small bowel, appendix, and cecum are normal. The colon and rectosigmoid colon are normal. Vascular/Lymphatic: Abdominal aorta is normal caliber. There is no retroperitoneal or periportal lymphadenopathy. No pelvic lymphadenopathy. Reproductive: Uterus and adnexa unremarkable. Other: No free fluid. Musculoskeletal: No aggressive osseous lesion. IMPRESSION: 1. No acute findings in the abdomen pelvis. 2. Nonobstructing RIGHT renal calculus. Electronically Signed   By: Genevive Bi M.D.   On: 11/10/2022 14:55           LOS: 3 days   Time spent= 35 mins    Kyiah Canepa Joline Maxcy, MD [facility]  If 7PM-7AM, please contact night-coverage  11/12/2022, 11:50 AM

## 2022-11-12 NOTE — Progress Notes (Signed)
  Echocardiogram 2D Echocardiogram has been performed.  [name] 11/12/2022, 4:45 PM

## 2022-11-13 ENCOUNTER — Other Ambulatory Visit (HOSPITAL_COMMUNITY): Payer: Self-pay

## 2022-11-13 DIAGNOSIS — R000 Tachycardia, unspecified: Secondary | ICD-10-CM

## 2022-11-13 LAB — CBC
HCT: 35.3 % — ABNORMAL LOW (ref 36.0–46.0)
Hemoglobin: 10.5 g/dL — ABNORMAL LOW (ref 12.0–15.0)
MCH: 22.5 pg — ABNORMAL LOW (ref 26.0–34.0)
MCHC: 29.7 g/dL — ABNORMAL LOW (ref 30.0–36.0)
MCV: 75.6 fL — ABNORMAL LOW (ref 80.0–100.0)
Platelets: 414 10*3/uL — ABNORMAL HIGH (ref 150–400)
RBC: 4.67 MIL/uL (ref 3.87–5.11)
RDW: 18.1 % — ABNORMAL HIGH (ref 11.5–15.5)
WBC: 12.8 10*3/uL — ABNORMAL HIGH (ref 4.0–10.5)
nRBC: 0 % (ref 0.0–0.2)

## 2022-11-13 LAB — BASIC METABOLIC PANEL
Anion gap: 9 (ref 5–15)
BUN: 13 mg/dL (ref 6–20)
CO2: 20 mmol/L — ABNORMAL LOW (ref 22–32)
Calcium: 8.7 mg/dL — ABNORMAL LOW (ref 8.9–10.3)
Chloride: 103 mmol/L (ref 98–111)
Creatinine, Ser: 0.58 mg/dL (ref 0.44–1.00)
GFR, Estimated: >60 mL/min (ref >60)
Glucose, Bld: 86 mg/dL (ref 70–99)
Potassium: 3.1 mmol/L — ABNORMAL LOW (ref 3.5–5.1)
Sodium: 132 mmol/L — ABNORMAL LOW (ref 135–145)

## 2022-11-13 LAB — GLUCOSE, CAPILLARY: Glucose-Capillary: 102 mg/dL — ABNORMAL HIGH (ref 70–99)

## 2022-11-13 MED ORDER — METOCLOPRAMIDE HCL 10 MG PO TABS
10.0000 mg | ORAL_TABLET | Freq: Three times a day (TID) | ORAL | 0 refills | Status: DC
  Filled 2022-11-13: qty 90, 30d supply, fill #0

## 2022-11-13 MED ORDER — FERROUS SULFATE 325 (65 FE) MG PO TABS
325.0000 mg | ORAL_TABLET | Freq: Every day | ORAL | 0 refills | Status: DC
  Filled 2022-11-13 – 2023-01-08 (×2): qty 30, 30d supply, fill #0

## 2022-11-13 MED ORDER — LANCET DEVICE MISC
1.0000 | Freq: Three times a day (TID) | 0 refills | Status: DC
Start: 2022-11-13 — End: 2022-12-21
  Filled 2022-11-13: qty 1, fill #0

## 2022-11-13 MED ORDER — DOCUSATE SODIUM 100 MG PO CAPS
100.0000 mg | ORAL_CAPSULE | Freq: Two times a day (BID) | ORAL | 0 refills | Status: DC
  Filled 2022-11-13: qty 100, 50d supply, fill #0
  Filled 2023-01-08: qty 10, 5d supply, fill #0

## 2022-11-13 MED ORDER — BLOOD GLUCOSE TEST VI STRP
1.0000 | ORAL_STRIP | Freq: Three times a day (TID) | 0 refills | Status: DC
Start: 2022-11-13 — End: 2023-12-12
  Filled 2022-11-13: qty 100, 34d supply, fill #0

## 2022-11-13 MED ORDER — PEN NEEDLES 31G X 5 MM MISC
1.0000 | Freq: Three times a day (TID) | 0 refills | Status: DC
Start: 2022-11-13 — End: 2022-12-21
  Filled 2022-11-13: qty 100, 33d supply, fill #0

## 2022-11-13 MED ORDER — BLOOD GLUCOSE MONITOR SYSTEM W/DEVICE KIT
1.0000 | PACK | Freq: Three times a day (TID) | 0 refills | Status: DC
Start: 2022-11-13 — End: 2023-12-12
  Filled 2022-11-13: qty 1, 30d supply, fill #0

## 2022-11-13 MED ORDER — AMLODIPINE BESYLATE 10 MG PO TABS
10.0000 mg | ORAL_TABLET | Freq: Every day | ORAL | 0 refills | Status: DC
  Filled 2022-11-13: qty 30, 30d supply, fill #0

## 2022-11-13 MED ORDER — SENNOSIDES-DOCUSATE SODIUM 8.6-50 MG PO TABS
2.0000 | ORAL_TABLET | Freq: Two times a day (BID) | ORAL | 0 refills | Status: DC | PRN
  Filled 2022-11-13: qty 60, 15d supply, fill #0

## 2022-11-13 MED ORDER — LISINOPRIL 40 MG PO TABS
40.0000 mg | ORAL_TABLET | Freq: Every day | ORAL | 0 refills | Status: DC
  Filled 2022-11-13: qty 30, 30d supply, fill #0

## 2022-11-13 MED ORDER — LANCETS MISC
1.0000 | Freq: Three times a day (TID) | 0 refills | Status: DC
Start: 2022-11-13 — End: 2023-12-12
  Filled 2022-11-13: qty 100, 34d supply, fill #0

## 2022-11-13 MED ORDER — METOPROLOL TARTRATE 50 MG PO TABS
50.0000 mg | ORAL_TABLET | Freq: Two times a day (BID) | ORAL | 0 refills | Status: DC
  Filled 2022-11-13: qty 60, 30d supply, fill #0

## 2022-11-13 MED ORDER — POTASSIUM CHLORIDE CRYS ER 20 MEQ PO TBCR
40.0000 meq | EXTENDED_RELEASE_TABLET | Freq: Once | ORAL | Status: AC
  Administered 2022-11-13: 40 meq via ORAL
  Filled 2022-11-13: qty 2

## 2022-11-13 MED ORDER — NOVOLIN 70/30 FLEXPEN RELION (70-30) 100 UNIT/ML ~~LOC~~ SUPN
50.0000 [IU] | PEN_INJECTOR | Freq: Two times a day (BID) | SUBCUTANEOUS | 0 refills | Status: DC
Start: 2022-11-13 — End: 2022-12-08
  Filled 2022-11-13: qty 21, 21d supply, fill #0

## 2022-11-13 NOTE — Inpatient Diabetes Management (Signed)
Inpatient Diabetes Program Recommendations  AACE/ADA: New Consensus Statement on Inpatient Glycemic Control (2015)  Target Ranges:  Prepandial:   less than 140 mg/dL      Peak postprandial:   less than 180 mg/dL (1-2 hours)      Critically ill patients:  140 - 180 mg/dL   Lab Results  Component Value Date   GLUCAP 102 (H) 11/13/2022   HGBA1C 11.5 (H) 11/08/2022    Review of Glycemic Control  Inpatient Diabetes Program Recommendations  AACE/ADA: New Consensus Statement on Inpatient Glycemic Control (2015)  Target Ranges:  Prepandial:   less than 140 mg/dL      Peak postprandial:   less than 180 mg/dL (1-2 hours)      Critically ill patients:  140 - 180 mg/dL   Lab Results  Component Value Date   GLUCAP 102 (H) 11/13/2022   HGBA1C 11.5 (H) 11/08/2022    Review of Glycemic Control  Diabetes history: DM2 Outpatient Diabetes medications: Farxiga 10 mg QD, Novolog 30 BID, metformin 1000 mg BID Current orders for Inpatient glycemic control: Novolog 70/30 50 units BID, Novolog 0-15 TID with meals  HgbA1C - 11.5%  Inpatient Diabetes Program Recommendations:    See Diabetes Coordinator Discharge Recommendations  For discharge today.  Will need to f/u with PCP within the next week or two for diabetes management.   Thank you. Ailene Ards, RD, LDN, CDCES Inpatient Diabetes Coordinator [phone]

## 2022-11-13 NOTE — Discharge Summary (Signed)
Physician Discharge Summary  [name] ZOX:[id number] DOB: Apr 16, 1976 DOA: 11/08/2022  PCP: Georganna Skeans, MD  Admit date: 11/08/2022 Discharge date: 11/13/2022  Admitted From: Home Disposition: Home  Recommendations for Outpatient Follow-up:  Follow up with PCP in 1-2 weeks Please obtain BMP/CBC in one week your next doctors visit.  For now stopped [place], home NovoLog and metformin.  Started on 70/30 insulin 50 units twice daily.  Supplies have been given.  Blood glucose is fairly stable here.  This can be further adjusted outpatient by her primary care provider Metoprolol 50 mg twice daily started.  Needs to follow-up outpatient cardiology, their services to make arrangements Lisinopril has been changed to 40 mg daily. Reglan prescribed.  Recommend outpatient gastric emptying study   Discharge Condition: Stable CODE STATUS: Full code Diet recommendation: Diabetic  Brief/Interim Summary: 47 year old with history of DM2, HTN, bipolar admitted for nausea, vomiting and presyncope.  Upon arrival to the ED she was noted to be hypertensive and hyperglycemic and diabetic ketoacidosis.  DKA improved, she was transitioned to subcu insulin but started having low-grade fever without any obvious evidence of infection.  Intermittent sinus tachycardia, started on metoprolol twice daily which was uptitrated.  Nausea vomiting suspected secondary to gastroparesis but for now Reglan seems to be working therefore we will continue. Patient did significantly well on Reglan and was tolerating orals without any issues.  Blood sugars were better controlled on insulin 70/30 50 units twice daily.  Medication adjustments as above. Patient was also seen by cardiology due to intermittent sinus tachycardia/tachyarrhythmia.  Echocardiogram unremarkable.  Tolerating metoprolol 50 mg twice daily.  Cardiology will arrange for outpatient follow-up.     Assessment & Plan:  Principal Problem:   DKA, type 2 (HCC) Active  Problems:   Bipolar disorder (HCC)   Essential hypertension   Nicotine abuse   Postural dizziness with presyncope   Hyperlipidemia   DKA (diabetic ketoacidosis) (HCC)     Diabetic ketoacidosis Diabetes mellitus type 2, insulin-dependent - DKA has resolved.  Currently on 70/30 insulin, increased to 50 units twice daily.  Continue sliding scale.  For now upon discharge we will discontinue Farxiga and metformin which can slowly be resumed outpatient. -A1c 11.5.  Seen by diabetic coordinator   Fever/leukocytosis.  SIRS - No obvious evidence of infection.  UA, procalcitonin are negative.  No URI symptoms.  CT of the abdomen pelvis does not show any intra-abdominal source.  I suspect her nausea and vomiting could be from gastroparesis.  Would benefit from outpatient gastric emptying study.   Intermittent nausea vomiting - Resolved, Working well for her   Sinus tachycardia, intermittent - Metoprolol 50 mg have been uptitrated.  TSH is normal.  Echocardiogram overall stable.  Seen by cardiology hold arrange for outpatient follow-up   Pseudohyponatremia, improved - Secondary to hyperglycemia.  Blood glucose being managed aggressively   Presyncope/dizziness - Suspect from dehydration -TSH-normal.  UDS-THC   Hypokalemia/hypophosphatemia - Repletion   Essential hypertension, improved - Lisinopril 40 mg daily, Norvasc 10 mg daily.  Increased metoprolol.  Prescriptions been given   Anemia of chronic disease, microcytosis - Hemoglobin around baseline.  Iron deficiency anemia.  Iron supplements and bowel regimen prescription given   Bipolar disorder - On Seroquel, Risperdal       Consultations: Cardiology Diabetic coordinator  Subjective: Feels well no complaints.  Wishes to go home.  She has to catch [place] bus [place].  She plans on moving back to [place] in about 2-3 weeks  and will follow-up here locally.  Discharge Exam: Vitals:   11/13/22 1043 11/13/22 1105   BP: (!) 151/81 (!) 156/96  Pulse: 100 (!) 123  Resp: 18 18  Temp: 97.6 F (36.4 C) 97.9 F (36.6 C)  SpO2: 98% 99%   Vitals:   11/12/22 1952 11/13/22 0520 11/13/22 1043 11/13/22 1105  BP: (!) 144/93 137/78 (!) 151/81 (!) 156/96  Pulse:  96 100 (!) 123  Resp: 16 16 18 18   Temp: (!) 97.3 F (36.3 C) 97.6 F (36.4 C) 97.6 F (36.4 C) 97.9 F (36.6 C)  TempSrc: Oral Oral Oral Oral  SpO2: 100% 100% 98% 99%  Weight:      Height:        General: Pt is alert, awake, not in acute distress Cardiovascular: RRR, S1/S2 +, no rubs, no gallops Respiratory: CTA bilaterally, no wheezing, no rhonchi Abdominal: Soft, NT, ND, bowel sounds + Extremities: no edema, no cyanosis  Discharge Instructions   Allergies as of 11/13/2022   No Known Allergies      Medication List     STOP taking these medications    dapagliflozin propanediol 10 MG Tabs tablet Commonly known as: FARXIGA   insulin aspart 100 UNIT/ML injection Commonly known as: novoLOG   metFORMIN 1000 MG tablet Commonly known as: Glucophage       TAKE these medications    Accu-Chek Guide test strip Generic drug: glucose blood Use as directed to check blood sugar three times daily   Accu-Chek Guide w/Device Kit Use 3 (three) times daily.   Accu-Chek Softclix Lancets lancets Use as directed to check blood sugar 3 times daily.   amLODipine 10 MG tablet Commonly known as: NORVASC Take 1 tablet (10 mg total) by mouth daily.   atorvastatin 80 MG tablet Commonly known as: Lipitor Take 1 tablet (80 mg total) by mouth daily. Elevated cholesterol   benztropine 0.5 MG tablet Commonly known as: COGENTIN Take 1 tablet (0.5 mg total) by mouth 2 (two) times daily. tremors   docusate sodium 100 MG capsule Commonly known as: COLACE Take 1 capsule (100 mg total) by mouth 2 (two) times daily.   ferrous sulfate 325 (65 FE) MG tablet Take 1 tablet (325 mg total) by mouth daily with breakfast. Start taking on: Nov 14, 2022   HumuLIN 70/30 KwikPen (70-30) 100 UNIT/ML KwikPen Generic drug: insulin isophane & regular human KwikPen Inject 50 Units into the skin 2 (two) times daily with a meal.   hydrOXYzine 25 MG tablet Commonly known as: ATARAX Take 1 tablet (25 mg total) by mouth 3 (three) times daily as needed for anxiety. For anxiety   Lancet Device Misc 1 each by Does not apply route 3 (three) times daily. May dispense any manufacturer covered by patient's insurance.   lisinopril 40 MG tablet Commonly known as: ZESTRIL Take 1 tablet (40 mg total) by mouth daily. What changed:  medication strength how much to take additional instructions   metoCLOPramide 10 MG tablet Commonly known as: REGLAN Take 1 tablet (10 mg total) by mouth 3 (three) times daily before meals.   metoprolol tartrate 50 MG tablet Commonly known as: LOPRESSOR Take 1 tablet (50 mg total) by mouth 2 (two) times daily.   risperiDONE 4 MG disintegrating tablet Commonly known as: RISPERDAL M-TABS Take 1 tablet (4 mg total) by mouth at bedtime. For mood disorder   risperiDONE 3 MG disintegrating tablet Commonly known as: RISPERDAL M-TABS Take 1 tablet (3 mg total) by mouth daily.  Mood disorder.   senna-docusate 8.6-50 MG tablet Commonly known as: Senokot-S Take 2 tablets by mouth 2 (two) times daily as needed for moderate constipation.   TechLite Pen Needles 31G X 5 MM Misc Generic drug: Insulin Pen Needle Use with insulin 3 (three) times daily.   traZODone 50 MG tablet Commonly known as: DESYREL Take 1 tablet (50 mg total) by mouth at bedtime as needed for sleep. For sleep        Follow-up Information     Georganna Skeans, MD Follow up in 1 week(s).   Specialty: Family Medicine Contact information: [address] [place] [address] [phone]                No Known Allergies  You were cared for by a hospitalist during your hospital stay. If you have any questions about your  discharge medications or the care you received while you were in the hospital after you are discharged, you can call the unit and asked to speak with the hospitalist on call if the hospitalist that took care of you is not available. Once you are discharged, your primary care physician will handle any further medical issues. Please note that no refills for any discharge medications will be authorized once you are discharged, as it is imperative that you return to your primary care physician (or establish a relationship with a primary care physician if you do not have one) for your aftercare needs so that they can reassess your need for medications and monitor your lab values.  You were cared for by a hospitalist during your hospital stay. If you have any questions about your discharge medications or the care you received while you were in the hospital after you are discharged, you can call the unit and asked to speak with the hospitalist on call if the hospitalist that took care of you is not available. Once you are discharged, your primary care physician will handle any further medical issues. Please note that NO REFILLS for any discharge medications will be authorized once you are discharged, as it is imperative that you return to your primary care physician (or establish a relationship with a primary care physician if you do not have one) for your aftercare needs so that they can reassess your need for medications and monitor your lab values.  Please request your Prim.MD to go over all Hospital Tests and Procedure/Radiological results at the follow up, please get all Hospital records sent to your Prim MD by signing hospital release before you go home.  Get CBC, CMP, 2 view Chest X ray checked  by Primary MD during your next visit or SNF MD in 5-7 days ( we routinely change or add medications that can affect your baseline labs and fluid status, therefore we recommend that you get the mentioned basic workup  next visit with your PCP, your PCP may decide not to get them or add new tests based on their clinical decision)  On your next visit with your primary care physician please Get Medicines reviewed and adjusted.  If you experience worsening of your admission symptoms, develop shortness of breath, life threatening emergency, suicidal or homicidal thoughts you must seek medical attention immediately by calling 911 or calling your MD immediately  if symptoms less severe.  You Must read complete instructions/literature along with all the possible adverse reactions/side effects for all the Medicines you take and that have been prescribed to you. Take any new Medicines after you have  completely understood and accpet all the possible adverse reactions/side effects.   Do not drive, operate heavy machinery, perform activities at heights, swimming or participation in water activities or provide baby sitting services if your were admitted for syncope or siezures until you have seen by Primary MD or a Neurologist and advised to do so again.  Do not drive when taking Pain medications.   Procedures/Studies: ECHOCARDIOGRAM COMPLETE  Result Date: 11/12/2022    ECHOCARDIOGRAM REPORT   Patient Name:   [name] Date of Exam: 11/12/2022 Medical Rec #:  [id number]  Height: Accession #:    [id number] Weight: Date of Birth:  08-25-1975   BSA: Patient Age:    47 years   BP:           182/96 mmHg Patient Gender: F          HR:           123 bpm. Exam Location:  Inpatient Procedure: 2D Echo, Cardiac Doppler, Color Doppler and Intracardiac            Opacification Agent Indications:    Cardiomyopathy  History:        Patient has no prior history of Echocardiogram examinations.                 Cardiomyopathy, Arrythmias:Tachycardia; Risk Factors:Current                 Smoker, Hypertension, Diabetes and HLD.  Sonographer:    Lucy Antigua Referring Phys: [id number] Wyndham Santilli CHIRAG Sheelah Ritacco IMPRESSIONS  1. Poor acoustic windows limit study,  even with use of Definity.  2. Left ventricular ejection fraction, by estimation, is 55%. The left ventricle has normal function. The left ventricle has no regional wall motion abnormalities. Left ventricular diastolic parameters were normal.  3. Right ventricular systolic function is normal. The right ventricular size is normal.  4. Trivial mitral valve regurgitation.  5. The aortic valve is tricuspid. Aortic valve regurgitation is not visualized. FINDINGS  Left Ventricle: Left ventricular ejection fraction, by estimation, is 55%. The left ventricle has normal function. The left ventricle has no regional wall motion abnormalities. Definity contrast agent was given IV to delineate the left ventricular endocardial borders. The left ventricular internal cavity size was normal in size. There is no left ventricular hypertrophy. Left ventricular diastolic parameters were normal. Right Ventricle: The right ventricular size is normal. Right vetricular wall thickness was not assessed. Right ventricular systolic function is normal. Left Atrium: Left atrial size was normal in size. Right Atrium: Right atrial size was normal in size. Pericardium: There is no evidence of pericardial effusion. Mitral Valve: There is mild thickening of the mitral valve leaflet(s). Trivial mitral valve regurgitation. Tricuspid Valve: The tricuspid valve is grossly normal. Tricuspid valve regurgitation is not demonstrated. Aortic Valve: The aortic valve is tricuspid. Aortic valve regurgitation is not visualized. Aortic valve mean gradient measures 4.0 mmHg. Aortic valve peak gradient measures 7.1 mmHg. Aortic valve area, by VTI measures 2.36 cm. Pulmonic Valve: The pulmonic valve was normal in structure. Pulmonic valve regurgitation is not visualized. Aorta: The aortic root and ascending aorta are structurally normal, with no evidence of dilitation. IAS/Shunts: No atrial level shunt detected by color flow Doppler.  LEFT VENTRICLE PLAX 2D LVIDd:          3.90 cm   Diastology LVIDs:         2.80 cm   LV e' medial:    10.40 cm/s LV PW:  1.10 cm   LV E/e' medial:  7.0 LV IVS:        1.00 cm   LV e' lateral:   13.20 cm/s LVOT diam:     1.90 cm   LV E/e' lateral: 5.5 LV SV:         53 LVOT Area:     2.84 cm  RIGHT VENTRICLE RV S prime:     17.20 cm/s LEFT ATRIUM           RIGHT ATRIUM LA Vol (A4C): 36.2 ml RA Area:     17.60 cm                       RA Volume:   52.10 ml  AORTIC VALVE AV Area (Vmax):    2.49 cm AV Area (Vmean):   2.16 cm AV Area (VTI):     2.36 cm AV Vmax:           133.00 cm/s AV Vmean:          96.200 cm/s AV VTI:            0.225 m AV Peak Grad:      7.1 mmHg AV Mean Grad:      4.0 mmHg LVOT Vmax:         117.00 cm/s LVOT Vmean:        73.200 cm/s LVOT VTI:          0.187 m LVOT/AV VTI ratio: 0.83  AORTA Ao Root diam: 3.10 cm Ao Asc diam:  3.00 cm MITRAL VALVE MV Area (PHT): 4.77 cm     SHUNTS MV Decel Time: 159 msec     Systemic VTI:  0.19 m MV E velocity: 72.50 cm/s   Systemic Diam: 1.90 cm MV A velocity: 103.00 cm/s MV E/A ratio:  0.70 Dietrich Pates MD Electronically signed by Dietrich Pates MD Signature Date/Time: 11/12/2022/8:42:45 PM    Final    CT ABDOMEN PELVIS W CONTRAST  Result Date: 11/10/2022 CLINICAL DATA:  Abdominal pain.  Acute nonlocalized EXAM: CT ABDOMEN AND PELVIS WITH CONTRAST TECHNIQUE: Multidetector CT imaging of the abdomen and pelvis was performed using the standard protocol following bolus administration of intravenous contrast. RADIATION DOSE REDUCTION: This exam was performed according to the departmental dose-optimization program which includes automated exposure control, adjustment of the mA and/or kV according to patient size and/or use of iterative reconstruction technique. CONTRAST:  OMNIPAQUE IOHEXOL 300 MG/ML  SOLN COMPARISON:  None Available. FINDINGS: Lower chest: Lung bases are clear. Hepatobiliary: No focal hepatic lesion. Postcholecystectomy. No biliary dilatation. Pancreas: Pancreas is  normal. No ductal dilatation. No pancreatic inflammation. Spleen: Normal spleen Adrenals/urinary tract: Adrenal glands normal. 3 mm calculus in the mid RIGHT kidney. No ureterolithiasis or obstructive uropathy. No bladder calculi. Stomach/Bowel: Stomach, small bowel, appendix, and cecum are normal. The colon and rectosigmoid colon are normal. Vascular/Lymphatic: Abdominal aorta is normal caliber. There is no retroperitoneal or periportal lymphadenopathy. No pelvic lymphadenopathy. Reproductive: Uterus and adnexa unremarkable. Other: No free fluid. Musculoskeletal: No aggressive osseous lesion. IMPRESSION: 1. No acute findings in the abdomen pelvis. 2. Nonobstructing RIGHT renal calculus. Electronically Signed   By: Genevive Bi M.D.   On: 11/10/2022 14:55   DG CHEST PORT 1 VIEW  Result Date: 11/08/2022 CLINICAL DATA:  Diabetic ketoacidosis. Nausea and vomiting. EXAM: PORTABLE CHEST 1 VIEW COMPARISON:  05/06/2021 FINDINGS: The cardiomediastinal contours are normal. The lungs are clear. Pulmonary vasculature is normal. No consolidation, pleural effusion,  or pneumothorax. No acute osseous abnormalities are seen. IMPRESSION: No acute chest findings. Electronically Signed   By: Narda Rutherford M.D.   On: 11/08/2022 21:18     The results of significant diagnostics from this hospitalization (including imaging, microbiology, ancillary and laboratory) are listed below for reference.     Microbiology: Recent Results (from the past 240 hour(s))  MRSA Next Gen by PCR, Nasal     Status: None   Collection Time: 11/08/22  9:46 PM   Specimen: Nasal Mucosa; Nasal Swab  Result Value Ref Range Status   MRSA by PCR Next Gen NOT DETECTED NOT DETECTED Final    Comment: (NOTE) The GeneXpert MRSA Assay (FDA approved for NASAL specimens only), is one component of a comprehensive MRSA colonization surveillance program. It is not intended to diagnose MRSA infection nor to guide or monitor treatment for MRSA  infections. Test performance is not FDA approved in patients less than 73 years old. Performed at [facility], [address], [place], [place] [address]   Culture, blood (Routine X 2) w Reflex to ID Panel     Status: None (Preliminary result)   Collection Time: 11/09/22  4:54 PM   Specimen: BLOOD RIGHT ARM  Result Value Ref Range Status   Specimen Description   Final    BLOOD RIGHT ARM Performed at [facility], [address] [place], [place] [address]    Special Requests   Final    BOTTLES DRAWN AEROBIC AND ANAEROBIC Blood Culture adequate volume Performed at [facility], [address] [place], [place] [address]    Culture   Final    NO GROWTH 4 DAYS Performed at [facility], [address] [place], [place] [address]    Report Status PENDING  Incomplete  Culture, blood (Routine X 2) w Reflex to ID Panel     Status: None (Preliminary result)   Collection Time: 11/09/22  4:54 PM   Specimen: BLOOD RIGHT HAND  Result Value Ref Range Status   Specimen Description   Final    BLOOD RIGHT HAND Performed at [facility], [address], [place], [place] [address]    Special Requests   Final    BOTTLES DRAWN AEROBIC ONLY Blood Culture adequate volume Performed at [facility], [address] [place], [place] [address]    Culture   Final    NO GROWTH 4 DAYS Performed at [facility], [address], [place], [place] [address]    Report Status PENDING  Incomplete     Labs: BNP (last 3 results) No results for input(s): "BNP" in the last 8760 hours. Basic Metabolic Panel: Recent Labs  Lab 11/08/22 2000 11/08/22 2153 11/09/22 0019 11/09/22 0500 11/09/22 0846 11/09/22 1608 11/10/22 0306 11/11/22 9629 11/12/22 0618 11/13/22 0452  NA  --  135   < >  --    < > 131* 128* 131* 132* 132*  K  --  3.2*   < >  --    < > 4.1 3.8 3.7 3.5 3.1*  CL  --  103   < >  --    < > 102 101 101 101 103   CO2  --  15*   < >  --    < > 17* 18* 20* 19* 20*  GLUCOSE  --  334*   < >  --    < > 279* 306* 288* 197* 86  BUN  --  13   < >  --    < > 12 11 11 9 13   CREATININE  --  0.94   < >  --    < > 0.61 0.63 0.60 0.60 0.58  CALCIUM  --  9.7   < >  --    < > 9.2 8.5* 8.9 8.9 8.7*  MG 1.8  --   --  1.8  --   --  1.8 1.9  --   --   PHOS  --  3.9  --  1.5*  --   --   --   --   --   --    < > = values in this interval not displayed.   Liver Function Tests: Recent Labs  Lab 11/08/22 1749  AST 25  ALT 19  ALKPHOS 107  BILITOT 1.3*  PROT 8.8*  ALBUMIN 4.0   No results for input(s): "LIPASE", "AMYLASE" in the last 168 hours. No results for input(s): "AMMONIA" in the last 168 hours. CBC: Recent Labs  Lab 11/08/22 1749 11/10/22 0306 11/11/22 0619 11/12/22 0618 11/13/22 0452  WBC 15.5* 13.4* 10.7* 9.7 12.8*  NEUTROABS 14.4*  --   --   --   --   HGB 12.7 10.8* 11.3* 11.1* 10.5*  HCT 41.1 35.7* 36.9 36.5 35.3*  MCV 75.4* 76.3* 75.2* 75.3* 75.6*  PLT 460* 360 370 379 414*   Cardiac Enzymes: Recent Labs  Lab 11/08/22 2153  CKTOTAL 90   BNP: Invalid input(s): "POCBNP" CBG: Recent Labs  Lab 11/12/22 0730 11/12/22 1143 11/12/22 1705 11/12/22 1956 11/13/22 0710  GLUCAP 221* 261* 96 138* 102*   D-Dimer No results for input(s): "DDIMER" in the last 72 hours. Hgb A1c No results for input(s): "HGBA1C" in the last 72 hours. Lipid Profile No results for input(s): "CHOL", "HDL", "LDLCALC", "TRIG", "CHOLHDL", "LDLDIRECT" in the last 72 hours. Thyroid function studies No results for input(s): "TSH", "T4TOTAL", "T3FREE", "THYROIDAB" in the last 72 hours.  Invalid input(s): "FREET3" Anemia work up Recent Labs    11/11/22 0619  VITAMINB12 517  FOLATE 15.7  FERRITIN 16  TIBC 489*  IRON 46   Urinalysis    Component Value Date/Time   COLORURINE STRAW (A) 11/10/2022 2139   APPEARANCEUR CLEAR 11/10/2022 2139   LABSPEC 1.008 11/10/2022 2139   PHURINE 6.0 11/10/2022 2139    GLUCOSEU NEGATIVE 11/10/2022 2139   HGBUR LARGE (A) 11/10/2022 2139   BILIRUBINUR NEGATIVE 11/10/2022 2139   BILIRUBINUR negative 04/08/2021 1413   BILIRUBINUR neg 10/11/2015 1440   KETONESUR NEGATIVE 11/10/2022 2139   PROTEINUR NEGATIVE 11/10/2022 2139   UROBILINOGEN 0.2 04/08/2021 1413   UROBILINOGEN 0.2 09/15/2012 0000   NITRITE NEGATIVE 11/10/2022 2139   LEUKOCYTESUR LARGE (A) 11/10/2022 2139   Sepsis Labs Recent Labs  Lab 11/10/22 0306 11/11/22 0619 11/12/22 0618 11/13/22 0452  WBC 13.4* 10.7* 9.7 12.8*   Microbiology Recent Results (from the past 240 hour(s))  MRSA Next Gen by PCR, Nasal     Status: None   Collection Time: 11/08/22  9:46 PM   Specimen: Nasal Mucosa; Nasal Swab  Result Value Ref Range Status   MRSA by PCR Next Gen NOT DETECTED NOT DETECTED Final    Comment: (NOTE) The GeneXpert MRSA Assay (FDA approved for NASAL specimens only), is one component of a comprehensive MRSA colonization surveillance program. It is not intended to diagnose MRSA infection nor to guide or monitor treatment for MRSA infections. Test performance is not  FDA approved in patients less than 74 years old. Performed at [facility], [address] [place], [place] [address]   Culture, blood (Routine X 2) w Reflex to ID Panel     Status: None (Preliminary result)   Collection Time: 11/09/22  4:54 PM   Specimen: BLOOD RIGHT ARM  Result Value Ref Range Status   Specimen Description   Final    BLOOD RIGHT ARM Performed at [facility], [address] [place], [place] [address]    Special Requests   Final    BOTTLES DRAWN AEROBIC AND ANAEROBIC Blood Culture adequate volume Performed at [facility], [address] [place], [place] [address]    Culture   Final    NO GROWTH 4 DAYS Performed at [facility], [address], [place], [place] [address]    Report Status PENDING  Incomplete  Culture, blood (Routine X 2) w Reflex to  ID Panel     Status: None (Preliminary result)   Collection Time: 11/09/22  4:54 PM   Specimen: BLOOD RIGHT HAND  Result Value Ref Range Status   Specimen Description   Final    BLOOD RIGHT HAND Performed at [facility], [address], [place], [place] [address]    Special Requests   Final    BOTTLES DRAWN AEROBIC ONLY Blood Culture adequate volume Performed at [facility], [address], [place], [place] [address]    Culture   Final    NO GROWTH 4 DAYS Performed at [facility], [address] [place], [place] [address]    Report Status PENDING  Incomplete     Time coordinating discharge:  I have spent 35 minutes face to face with the patient and on the ward discussing the patients care, assessment, plan and disposition with other care givers. >50% of the time was devoted counseling the patient about the risks and benefits of treatment/Discharge disposition and coordinating care.   SIGNED:   Dimple Nanas, MD  [facility] 11/13/2022, 11:43 AM   If 7PM-7AM, please contact night-coverage

## 2022-11-13 NOTE — Consult Note (Addendum)
Cardiology Consultation   Patient ID: [name] MRN: [id number]; DOB: 01-Jun-1976  Admit date: 11/08/2022 Date of Consult: 11/13/2022  PCP:  Alyssa Skeans, MD   [facility] Providers Cardiologist:  None   {   Patient Profile:   [name] is a 47 y.o. female with a hx of diabetes type 2, hypertension, bipolar disorder, schizoaffective disorder who is being seen 11/13/2022 for the evaluation of tachycardia at the request of Dr. Nelson [name].  History of Present Illness:   [name] not been seen by cardiology in the past.  Patient lives in [place] and is planning on moving to [place] in June.  She denies history of heart attack, stenting, CHF.  She denies any family history of heart disease.  Patient reports occasional alcohol use and vaping.   Patient presented to the ER 11/08/22 with nausea, vomiting and presyncope.  Upon arrival to the ED she was hypertensive and hyperglycemic found to have DKA.  Patient was transition to subcu insulin.  She was noted to have a low-grade fever without any evidence of infection.  Noted to have intermittent tachycardia and cardiology was asked to see.  Initial EKG showed sinus rhythm.  Subsequent EKGs show sinus tach vs SVT.  Recent labs show sodium 132, potassium 3.1, serum creatinine 0.58, BUN 13, CO2 20, WBC 12.8, Hgb 10.5.  TSH normal. Mag 1.9.   Past Medical History:  Diagnosis Date   Abnormal Pap smear    Bipolar 1 disorder (HCC)    Diabetes mellitus without complication (HCC)    Fibroids    Hypertension    IBS (irritable bowel syndrome)     Past Surgical History:  Procedure Laterality Date   CERVICAL BIOPSY     CHOLECYSTECTOMY     ESSURE TUBAL LIGATION     HIATAL HERNIA REPAIR     TUBAL LIGATION       Home Medications:  Prior to Admission medications   Medication Sig Start Date End Date Taking? Authorizing Provider  atorvastatin (LIPITOR) 80 MG tablet Take 1 tablet (80 mg total) by mouth daily. Elevated cholesterol  06/01/21 11/08/22 Yes Nkwenti, Doris, NP  benztropine (COGENTIN) 0.5 MG tablet Take 1 tablet (0.5 mg total) by mouth 2 (two) times daily. tremors 06/01/21  Yes Nkwenti, Doris, NP  Blood Glucose Monitoring Suppl (BLOOD GLUCOSE MONITOR SYSTEM) w/Device KIT Use 3 (three) times daily. 11/13/22  Yes Amin, Loura Halt, MD  dapagliflozin propanediol (FARXIGA) 10 MG TABS tablet Take 10 mg by mouth daily.   Yes [provider]  Glucose Blood (BLOOD GLUCOSE TEST STRIPS) STRP Use as directed to check blood sugar three times daily 11/13/22  Yes Amin, Ankit Chirag, MD  hydrOXYzine (ATARAX) 25 MG tablet Take 1 tablet (25 mg total) by mouth 3 (three) times daily as needed for anxiety. For anxiety 06/01/21  Yes Nkwenti, Doris, NP  insulin aspart (NOVOLOG) 100 UNIT/ML injection Inject 0-9 Units into the skin 3 (three) times daily with meals. For elevated blood glucose Patient taking differently: Inject 30 Units into the skin 2 (two) times daily. For elevated blood glucose 06/01/21  Yes Nkwenti, Doris, NP  insulin isophane & regular human KwikPen (NOVOLIN 70/30 KWIKPEN) (70-30) 100 UNIT/ML KwikPen Inject 50 Units into the skin 2 (two) times daily with a meal. 11/13/22 12/13/22 Yes Amin, Ankit Chirag, MD  Insulin Pen Needle (PEN NEEDLES) 31G X 5 MM MISC Use with insulin 3 (three) times daily. 11/13/22  Yes Dimple Nanas, MD  Lancet Device MISC 1  each by Does not apply route 3 (three) times daily. May dispense any manufacturer covered by patient's insurance. 11/13/22  Yes Amin, Loura Halt, MD  Lancets MISC Use as directed to check blood sugar 3 times daily. 11/13/22  Yes Amin, Ankit Chirag, MD  lisinopril (ZESTRIL) 5 MG tablet Take 1 tablet (5 mg total) by mouth daily. For high blood pressure 06/01/21 11/08/22 Yes Nkwenti, Doris, NP  metFORMIN (GLUCOPHAGE) 1000 MG tablet Take 1 tablet (1,000 mg total) by mouth 2 (two) times daily with a meal. diabetes 06/01/21 11/08/22 Yes Nkwenti, Doris, NP  risperiDONE (RISPERDAL  M-TABS) 3 MG disintegrating tablet Take 1 tablet (3 mg total) by mouth daily. Mood disorder. 06/02/21  Yes Starleen Blue, NP  risperiDONE (RISPERDAL M-TABS) 4 MG disintegrating tablet Take 1 tablet (4 mg total) by mouth at bedtime. For mood disorder 06/01/21  Yes Starleen Blue, NP  traZODone (DESYREL) 50 MG tablet Take 1 tablet (50 mg total) by mouth at bedtime as needed for sleep. For sleep 06/01/21  Yes Starleen Blue, NP  amLODipine (NORVASC) 10 MG tablet Take 1 tablet (10 mg total) by mouth daily. 11/13/22   Amin, Loura Halt, MD  docusate sodium (COLACE) 100 MG capsule Take 1 capsule (100 mg total) by mouth 2 (two) times daily. 11/13/22   Amin, Loura Halt, MD  ferrous sulfate 325 (65 FE) MG tablet Take 1 tablet (325 mg total) by mouth daily with breakfast. 11/14/22   Amin, Loura Halt, MD  lisinopril (ZESTRIL) 40 MG tablet Take 1 tablet (40 mg total) by mouth daily. 11/13/22   Amin, Loura Halt, MD  metoCLOPramide (REGLAN) 10 MG tablet Take 1 tablet (10 mg total) by mouth 3 (three) times daily before meals. 11/13/22 12/13/22  Amin, Loura Halt, MD  metoprolol tartrate (LOPRESSOR) 50 MG tablet Take 1 tablet (50 mg total) by mouth 2 (two) times daily. 11/13/22   Amin, Ankit Chirag, MD  senna-docusate (SENOKOT-S) 8.6-50 MG tablet Take 2 tablets by mouth 2 (two) times daily as needed for moderate constipation. 11/13/22   Dimple Nanas, MD    Inpatient Medications: Scheduled Meds:  amLODipine  10 mg Oral Daily   atorvastatin  80 mg Oral Daily   docusate sodium  100 mg Oral BID   ferrous sulfate  325 mg Oral Q breakfast   insulin aspart  0-15 Units Subcutaneous TID WC   insulin aspart protamine- aspart  50 Units Subcutaneous BID WC   lisinopril  40 mg Oral Daily   metoCLOPramide  10 mg Oral TID AC   Or   metoCLOPramide (REGLAN) injection  10 mg Intravenous TID AC   metoprolol tartrate  50 mg Oral BID   nicotine  14 mg Transdermal Daily   polyethylene glycol  17 g Oral Daily   potassium  chloride  40 mEq Oral Once   risperiDONE  3 mg Oral Daily   risperiDONE  4 mg Oral QHS   Continuous Infusions:  PRN Meds: acetaminophen, baclofen, dextrose, guaiFENesin, hydrALAZINE, hydrOXYzine, ipratropium-albuterol, metoprolol tartrate, ondansetron (ZOFRAN) IV, mouth rinse, senna-docusate, traZODone, traZODone  Allergies:   No Known Allergies  Social History:   Social History   Socioeconomic History   Marital status: Single    Spouse name: Not on file   Number of children: 2   Years of education: Not on file   Highest education level: Master's degree (e.g., MA, MS, MEng, MEd, MSW, MBA)  Occupational History   Not on file  Tobacco Use   Smoking status: Former  Types: Cigarettes   Smokeless tobacco: Never  Vaping Use   Vaping Use: Some days   Substances: Nicotine, Flavoring  Substance and Sexual Activity   Alcohol use: Not Currently    Comment: occasional   Drug use: Not Currently   Sexual activity: Yes    Birth control/protection: Surgical  Other Topics Concern   Not on file  Social History Narrative   ** Merged History Encounter **       Social Determinants of Health   Financial Resource Strain: Not on file  Food Insecurity: No Food Insecurity (11/09/2022)   Hunger Vital Sign    Worried About Running Out of Food in the Last Year: Never true    Ran Out of Food in the Last Year: Never true  Transportation Needs: No Transportation Needs (11/09/2022)   PRAPARE - Administrator, Civil Service (Medical): No    Lack of Transportation (Non-Medical): No  Physical Activity: Not on file  Stress: Not on file  Social Connections: Not on file  Intimate Partner Violence: Not At Risk (11/09/2022)   Humiliation, Afraid, Rape, and Kick questionnaire    Fear of Current or Ex-Partner: No    Emotionally Abused: No    Physically Abused: No    Sexually Abused: No    Family History:    Family History  Problem Relation Age of Onset   Diabetes Father    Diabetes  Paternal Grandmother    Diabetes Maternal Grandmother    Mental illness Cousin    Healthy Mother      ROS:  Please see the history of present illness.   All other ROS reviewed and negative.     Physical Exam/Data:   Vitals:   11/12/22 0551 11/12/22 1307 11/12/22 1952 11/13/22 0520  BP: (!) 182/96 132/74 (!) 144/93 137/78  Pulse: 92 (!) 105  96  Resp: 16 20 16 16   Temp: 98.7 F (37.1 C) 98.8 F (37.1 C) (!) 97.3 F (36.3 C) 97.6 F (36.4 C)  TempSrc: Oral Oral Oral Oral  SpO2: 96% 98% 100% 100%  Weight:      Height:        Intake/Output Summary (Last 24 hours) at 11/13/2022 0907 Last data filed at 11/13/2022 0351 Gross per 24 hour  Intake 960 ml  Output --  Net 960 ml      11/10/2022   11:01 AM 11/08/2022   10:00 PM 11/08/2022    7:35 PM  Last 3 Weights  Weight (lbs) 277 lb 12.5 oz 267 lb 13.7 oz 286 lb  Weight (kg) 126 kg 121.5 kg 129.729 kg     Body mass index is 37.67 kg/m.  General:  Well nourished, well developed, in no acute distress HEENT: normal Neck: no JVD Vascular: No carotid bruits; Distal pulses 2+ bilaterally Cardiac:  normal S1, S2; RR, tachycardia; no murmur  Lungs:  clear to auscultation bilaterally, no wheezing, rhonchi or rales  Abd: soft, nontender, no hepatomegaly  Ext: no edema Musculoskeletal:  No deformities, BUE and BLE strength normal and equal Skin: warm and dry  Neuro:  CNs 2-12 intact, no focal abnormalities noted Psych:  Normal affect   EKG:  The EKG was personally reviewed and demonstrates:  ST 103bpm, nonspecific T wave changes Telemetry:  Telemetry was personally reviewed and demonstrates:  paroxysmal SVT vs atrial tachycardia Hr 120-140  Relevant CV Studies:  Echo 11/12/22 1. Poor acoustic windows limit study, even with use of Definity.   2. Left ventricular ejection  fraction, by estimation, is 55%. The left  ventricle has normal function. The left ventricle has no regional wall  motion abnormalities. Left ventricular  diastolic parameters were normal.   3. Right ventricular systolic function is normal. The right ventricular  size is normal.   4. Trivial mitral valve regurgitation.   5. The aortic valve is tricuspid. Aortic valve regurgitation is not  visualized.   Laboratory Data:  High Sensitivity Troponin:   Recent Labs  Lab 11/08/22 2000 11/08/22 2153 11/08/22 2300 11/09/22 0619  TROPONINIHS 2 3 3 3      Chemistry Recent Labs  Lab 11/09/22 0500 11/09/22 0846 11/10/22 0306 11/11/22 0619 11/12/22 0618 11/13/22 0452  NA  --    < > 128* 131* 132* 132*  K  --    < > 3.8 3.7 3.5 3.1*  CL  --    < > 101 101 101 103  CO2  --    < > 18* 20* 19* 20*  GLUCOSE  --    < > 306* 288* 197* 86  BUN  --    < > 11 11 9 13   CREATININE  --    < > 0.63 0.60 0.60 0.58  CALCIUM  --    < > 8.5* 8.9 8.9 8.7*  MG 1.8  --  1.8 1.9  --   --   GFRNONAA  --    < > >60 >60 >60 >60  ANIONGAP  --    < > 9 10 12 9    < > = values in this interval not displayed.    Recent Labs  Lab 11/08/22 1749  PROT 8.8*  ALBUMIN 4.0  AST 25  ALT 19  ALKPHOS 107  BILITOT 1.3*   Lipids No results for input(s): "CHOL", "TRIG", "HDL", "LABVLDL", "LDLCALC", "CHOLHDL" in the last 168 hours.  Hematology Recent Labs  Lab 11/11/22 0619 11/12/22 0618 11/13/22 0452  WBC 10.7* 9.7 12.8*  RBC 4.91 4.85 4.67  HGB 11.3* 11.1* 10.5*  HCT 36.9 36.5 35.3*  MCV 75.2* 75.3* 75.6*  MCH 23.0* 22.9* 22.5*  MCHC 30.6 30.4 29.7*  RDW 17.9* 17.8* 18.1*  PLT 370 379 414*   Thyroid  Recent Labs  Lab 11/08/22 1749  TSH 0.500    BNPNo results for input(s): "BNP", "PROBNP" in the last 168 hours.  DDimer No results for input(s): "DDIMER" in the last 168 hours.   Radiology/Studies:  ECHOCARDIOGRAM COMPLETE  Result Date: 11/12/2022    ECHOCARDIOGRAM REPORT   Patient Name:   CARSEN EMMENDORFER Date of Exam: 11/12/2022 Medical Rec #:  [id number]  Height: Accession #:    [id number] Weight: Date of Birth:  [birth year]   BSA: Patient Age:    47 years    BP:           182/96 mmHg Patient Gender: F          HR:           123 bpm. Exam Location:  Inpatient Procedure: 2D Echo, Cardiac Doppler, Color Doppler and Intracardiac            Opacification Agent Indications:    Cardiomyopathy  History:        Patient has no prior history of Echocardiogram examinations.                 Cardiomyopathy, Arrythmias:Tachycardia; Risk Factors:Current                 Smoker, Hypertension, Diabetes and HLD.  Sonographer:  Lucy Antigua Referring Phys: [id number] ANKIT CHIRAG AMIN IMPRESSIONS  1. Poor acoustic windows limit study, even with use of Definity.  2. Left ventricular ejection fraction, by estimation, is 55%. The left ventricle has normal function. The left ventricle has no regional wall motion abnormalities. Left ventricular diastolic parameters were normal.  3. Right ventricular systolic function is normal. The right ventricular size is normal.  4. Trivial mitral valve regurgitation.  5. The aortic valve is tricuspid. Aortic valve regurgitation is not visualized. FINDINGS  Left Ventricle: Left ventricular ejection fraction, by estimation, is 55%. The left ventricle has normal function. The left ventricle has no regional wall motion abnormalities. Definity contrast agent was given IV to delineate the left ventricular endocardial borders. The left ventricular internal cavity size was normal in size. There is no left ventricular hypertrophy. Left ventricular diastolic parameters were normal. Right Ventricle: The right ventricular size is normal. Right vetricular wall thickness was not assessed. Right ventricular systolic function is normal. Left Atrium: Left atrial size was normal in size. Right Atrium: Right atrial size was normal in size. Pericardium: There is no evidence of pericardial effusion. Mitral Valve: There is mild thickening of the mitral valve leaflet(s). Trivial mitral valve regurgitation. Tricuspid Valve: The tricuspid valve is grossly normal. Tricuspid valve  regurgitation is not demonstrated. Aortic Valve: The aortic valve is tricuspid. Aortic valve regurgitation is not visualized. Aortic valve mean gradient measures 4.0 mmHg. Aortic valve peak gradient measures 7.1 mmHg. Aortic valve area, by VTI measures 2.36 cm. Pulmonic Valve: The pulmonic valve was normal in structure. Pulmonic valve regurgitation is not visualized. Aorta: The aortic root and ascending aorta are structurally normal, with no evidence of dilitation. IAS/Shunts: No atrial level shunt detected by color flow Doppler.  LEFT VENTRICLE PLAX 2D LVIDd:         3.90 cm   Diastology LVIDs:         2.80 cm   LV e' medial:    10.40 cm/s LV PW:         1.10 cm   LV E/e' medial:  7.0 LV IVS:        1.00 cm   LV e' lateral:   13.20 cm/s LVOT diam:     1.90 cm   LV E/e' lateral: 5.5 LV SV:         53 LVOT Area:     2.84 cm  RIGHT VENTRICLE RV S prime:     17.20 cm/s LEFT ATRIUM           RIGHT ATRIUM LA Vol (A4C): 36.2 ml RA Area:     17.60 cm                       RA Volume:   52.10 ml  AORTIC VALVE AV Area (Vmax):    2.49 cm AV Area (Vmean):   2.16 cm AV Area (VTI):     2.36 cm AV Vmax:           133.00 cm/s AV Vmean:          96.200 cm/s AV VTI:            0.225 m AV Peak Grad:      7.1 mmHg AV Mean Grad:      4.0 mmHg LVOT Vmax:         117.00 cm/s LVOT Vmean:        73.200 cm/s LVOT VTI:  0.187 m LVOT/AV VTI ratio: 0.83  AORTA Ao Root diam: 3.10 cm Ao Asc diam:  3.00 cm MITRAL VALVE MV Area (PHT): 4.77 cm     SHUNTS MV Decel Time: 159 msec     Systemic VTI:  0.19 m MV E velocity: 72.50 cm/s   Systemic Diam: 1.90 cm MV A velocity: 103.00 cm/s MV E/A ratio:  0.70 Dietrich Pates MD Electronically signed by Dietrich Pates MD Signature Date/Time: 11/12/2022/8:42:45 PM    Final    CT ABDOMEN PELVIS W CONTRAST  Result Date: 11/10/2022 CLINICAL DATA:  Abdominal pain.  Acute nonlocalized EXAM: CT ABDOMEN AND PELVIS WITH CONTRAST TECHNIQUE: Multidetector CT imaging of the abdomen and pelvis was performed using  the standard protocol following bolus administration of intravenous contrast. RADIATION DOSE REDUCTION: This exam was performed according to the departmental dose-optimization program which includes automated exposure control, adjustment of the mA and/or kV according to patient size and/or use of iterative reconstruction technique. CONTRAST:  OMNIPAQUE IOHEXOL 300 MG/ML  SOLN COMPARISON:  None Available. FINDINGS: Lower chest: Lung bases are clear. Hepatobiliary: No focal hepatic lesion. Postcholecystectomy. No biliary dilatation. Pancreas: Pancreas is normal. No ductal dilatation. No pancreatic inflammation. Spleen: Normal spleen Adrenals/urinary tract: Adrenal glands normal. 3 mm calculus in the mid RIGHT kidney. No ureterolithiasis or obstructive uropathy. No bladder calculi. Stomach/Bowel: Stomach, small bowel, appendix, and cecum are normal. The colon and rectosigmoid colon are normal. Vascular/Lymphatic: Abdominal aorta is normal caliber. There is no retroperitoneal or periportal lymphadenopathy. No pelvic lymphadenopathy. Reproductive: Uterus and adnexa unremarkable. Other: No free fluid. Musculoskeletal: No aggressive osseous lesion. IMPRESSION: 1. No acute findings in the abdomen pelvis. 2. Nonobstructing RIGHT renal calculus. Electronically Signed   By: Genevive Bi M.D.   On: 11/10/2022 14:55     Assessment and Plan:   Tachyarrhythmia - appears to be in ?paroxysmal SVT  - tele shows paroxysmal tachy-arrhythmia narrow QRS, with rates 120-140s - patient appears to be fairly asymptomatic - she feels stress is greatly contributing to symptoms. Also in the setting of DKA and dehydration - TSH normal - keep K>4 and Mag>2. Supplement as needed - do not think cardioversion will benefit patient since she is paroxysmal - continue metoprolol 50mg  BID. Can increase to 100mg  BID - she will eventually she a heart monitor - would benefit from seeing EP as outpatient. She is needing to be  discharged today and drive back to [place]. She is planning on moving to [facility] in June so we will continue to follow as OP.  DKA - A1C 11.5 - SSI per IM  Leokocytosis/SIRS - no signs of infection - per IM  Presyncope/dizziness - in the setting of DKA and dehydration - could also be from tachy-arrhythmia - orthostatics ordered - echo is unremarkable - UDS with [facility] - will need heart monitor at discharge  HTN - PTA lisinopril 40mg  daily and Norvasc 10mg  daily - metoprolol 50mg  BID - BP is good  Anemia of chronic - Hgb 10-11  For questions or updates, please contact [facility] Please consult [email or web address] for contact info under    Signed, Cadence David Stall, PA-C  11/13/2022 9:07 AM   I have discussed the patient along with Cadence Ardelle Lesches, PA NP.  I have reviewed the chart, notes and new data.  I agree with PA/NP's note.  Key new complaints: No CV complaints Key examination changes: unable to examine Key new findings / data: ECG and all the rhythm strips show narrow complex  long RP tachycardia with gradual onset and gradual resolution, consistent withy sinus tachycardia. The P wave morphology is unchanged on ECG with rates as fast as 143 bpm and as slow as 72 bpm. Cannot entirely exclude ectopic atrial tachycardia.  PLAN: Suspect that she has physiological sinus tachycardia, reacting appropriately to extracardiac illness. Monitor is reasonable. Would avoid higher doses of metoprolol in the setting of DM.  Thurmon Fair, MD, [facility] [phone] 11/13/2022, 1:28 PM

## 2022-11-13 NOTE — Progress Notes (Signed)
MD, patient is concerned about possible vaginitis (as she has a history of this). She said it can be difficult to void and she has to "push it out". She is voiding, but it is difficult to start. NP on call was notified Friday night and I received order for UA. Just following up. Patient mentioned maybe needing a wet prep. Thanks, Priscille Kluver

## 2022-11-14 ENCOUNTER — Telehealth: Payer: Self-pay

## 2022-11-14 LAB — CULTURE, BLOOD (ROUTINE X 2)
Culture: NO GROWTH
Special Requests: ADEQUATE

## 2022-11-14 NOTE — Transitions of Care (Post Inpatient/ED Visit) (Signed)
   11/14/2022  Name: [name] MRN: [id number] DOB: Dec 13, 1975  Today's TOC FU Call Status: Today's TOC FU Call Status:: Unsuccessul Call (1st Attempt) Unsuccessful Call (1st Attempt) Date: 11/14/22  Attempted to reach the patient regarding the most recent Inpatient/ED visit.  Follow Up Plan: Additional outreach attempts will be made to reach the patient to complete the Transitions of Care (Post Inpatient/ED visit) call.   Signature Robyne Peers, RN

## 2022-11-15 ENCOUNTER — Telehealth: Payer: Self-pay

## 2022-11-15 NOTE — Transitions of Care (Post Inpatient/ED Visit) (Signed)
   11/15/2022  Name: [name] MRN: [id number] DOB: 1975-08-13  Today's TOC FU Call Status: Today's TOC FU Call Status:: Unsuccessful Call (2nd Attempt) Unsuccessful Call (1st Attempt) Date: 11/14/22 Unsuccessful Call (2nd Attempt) Date: 11/15/22  Attempted to reach the patient regarding the most recent Inpatient/ED visit.  Follow Up Plan: Additional outreach attempts will be made to reach the patient to complete the Transitions of Care (Post Inpatient/ED visit) call.   Signature Robyne Peers, RN

## 2022-11-16 ENCOUNTER — Telehealth: Payer: Self-pay

## 2022-11-16 NOTE — Transitions of Care (Post Inpatient/ED Visit) (Signed)
   11/16/2022  Name: [name] MRN: [id number] DOB: May 22, 1976  Today's TOC FU Call Status: Today's TOC FU Call Status:: Unsuccessful Call (3rd Attempt) Unsuccessful Call (1st Attempt) Date: 11/14/22 Unsuccessful Call (2nd Attempt) Date: 11/15/22 Unsuccessful Call (3rd Attempt) Date: 11/16/22  Attempted to reach the patient regarding the most recent Inpatient/ED visit.  Follow Up Plan: No further outreach attempts will be made at this time. We have been unable to contact the patient.  Letter sent to patient requesting she contact  [facility] to schedule a follow up appointment as we have not been able to reach her. The patient's current address is [facility]   Robyne Peers, RN

## 2022-11-30 NOTE — Progress Notes (Signed)
Erroneous encounter-disregard

## 2022-12-04 ENCOUNTER — Encounter: Payer: Medicaid Other | Admitting: Family

## 2022-12-04 ENCOUNTER — Ambulatory Visit: Payer: Medicaid Other | Attending: Nurse Practitioner | Admitting: Nurse Practitioner

## 2022-12-04 DIAGNOSIS — F319 Bipolar disorder, unspecified: Secondary | ICD-10-CM

## 2022-12-04 DIAGNOSIS — R42 Dizziness and giddiness: Secondary | ICD-10-CM

## 2022-12-04 DIAGNOSIS — E1110 Type 2 diabetes mellitus with ketoacidosis without coma: Secondary | ICD-10-CM

## 2022-12-04 DIAGNOSIS — I10 Essential (primary) hypertension: Secondary | ICD-10-CM

## 2022-12-04 DIAGNOSIS — Z720 Tobacco use: Secondary | ICD-10-CM

## 2022-12-04 DIAGNOSIS — Z09 Encounter for follow-up examination after completed treatment for conditions other than malignant neoplasm: Secondary | ICD-10-CM

## 2022-12-04 DIAGNOSIS — E785 Hyperlipidemia, unspecified: Secondary | ICD-10-CM

## 2022-12-04 DIAGNOSIS — K3184 Gastroparesis: Secondary | ICD-10-CM

## 2022-12-04 NOTE — Progress Notes (Deleted)
Office Visit    Patient Name: [name] Date of Encounter: 12/04/2022  Primary Care Provider:  Georganna Skeans, MD Primary Cardiologist:  Thurmon Fair, MD  Chief Complaint    47 year old female with a history of tachyarrhythmia (?PSVT), dizziness, hypertension, hyperlipidemia, type 2 diabetes, bipolar disorder, schizoaffective disorder, and anemia who presents for hospital follow-up related to tachycardia.  Past Medical History    Past Medical History:  Diagnosis Date   Abnormal Pap smear    Bipolar 1 disorder (HCC)    Diabetes mellitus without complication (HCC)    Fibroids    Hypertension    IBS (irritable bowel syndrome)    Past Surgical History:  Procedure Laterality Date   CERVICAL BIOPSY     CHOLECYSTECTOMY     ESSURE TUBAL LIGATION     HIATAL HERNIA REPAIR     TUBAL LIGATION      Allergies  No Known Allergies   Labs/Other Studies Reviewed    The following studies were reviewed today:  Cardiac Studies & Procedures       ECHOCARDIOGRAM  ECHOCARDIOGRAM COMPLETE 11/12/2022  Narrative ECHOCARDIOGRAM REPORT    Patient Name:   [name] Date of Exam: 11/12/2022 Medical Rec #:  [id number]  Height: Accession #:    [id number] Weight: Date of Birth:  1976-06-13   BSA: Patient Age:    47 years   BP:           182/96 mmHg Patient Gender: F          HR:           123 bpm. Exam Location:  Inpatient  Procedure: 2D Echo, Cardiac Doppler, Color Doppler and Intracardiac Opacification Agent  Indications:    Cardiomyopathy  History:        Patient has no prior history of Echocardiogram examinations. Cardiomyopathy, Arrythmias:Tachycardia; Risk Factors:Current Smoker, Hypertension, Diabetes and HLD.  Sonographer:    Lucy Antigua Referring Phys: [id number] ANKIT CHIRAG AMIN  IMPRESSIONS   1. Poor acoustic windows limit study, even with use of Definity. 2. Left ventricular ejection fraction, by estimation, is 55%. The left ventricle has normal function. The  left ventricle has no regional wall motion abnormalities. Left ventricular diastolic parameters were normal. 3. Right ventricular systolic function is normal. The right ventricular size is normal. 4. Trivial mitral valve regurgitation. 5. The aortic valve is tricuspid. Aortic valve regurgitation is not visualized.  FINDINGS Left Ventricle: Left ventricular ejection fraction, by estimation, is 55%. The left ventricle has normal function. The left ventricle has no regional wall motion abnormalities. Definity contrast agent was given IV to delineate the left ventricular endocardial borders. The left ventricular internal cavity size was normal in size. There is no left ventricular hypertrophy. Left ventricular diastolic parameters were normal.  Right Ventricle: The right ventricular size is normal. Right vetricular wall thickness was not assessed. Right ventricular systolic function is normal.  Left Atrium: Left atrial size was normal in size.  Right Atrium: Right atrial size was normal in size.  Pericardium: There is no evidence of pericardial effusion.  Mitral Valve: There is mild thickening of the mitral valve leaflet(s). Trivial mitral valve regurgitation.  Tricuspid Valve: The tricuspid valve is grossly normal. Tricuspid valve regurgitation is not demonstrated.  Aortic Valve: The aortic valve is tricuspid. Aortic valve regurgitation is not visualized. Aortic valve mean gradient measures 4.0 mmHg. Aortic valve peak gradient measures 7.1 mmHg. Aortic valve area, by VTI measures 2.36 cm.  Pulmonic Valve: The pulmonic valve was normal  in structure. Pulmonic valve regurgitation is not visualized.  Aorta: The aortic root and ascending aorta are structurally normal, with no evidence of dilitation.  IAS/Shunts: No atrial level shunt detected by color flow Doppler.   LEFT VENTRICLE PLAX 2D LVIDd:         3.90 cm   Diastology LVIDs:         2.80 cm   LV e' medial:    10.40 cm/s LV PW:          1.10 cm   LV E/e' medial:  7.0 LV IVS:        1.00 cm   LV e' lateral:   13.20 cm/s LVOT diam:     1.90 cm   LV E/e' lateral: 5.5 LV SV:         53 LVOT Area:     2.84 cm   RIGHT VENTRICLE RV S prime:     17.20 cm/s  LEFT ATRIUM           RIGHT ATRIUM LA Vol (A4C): 36.2 ml RA Area:     17.60 cm RA Volume:   52.10 ml AORTIC VALVE AV Area (Vmax):    2.49 cm AV Area (Vmean):   2.16 cm AV Area (VTI):     2.36 cm AV Vmax:           133.00 cm/s AV Vmean:          96.200 cm/s AV VTI:            0.225 m AV Peak Grad:      7.1 mmHg AV Mean Grad:      4.0 mmHg LVOT Vmax:         117.00 cm/s LVOT Vmean:        73.200 cm/s LVOT VTI:          0.187 m LVOT/AV VTI ratio: 0.83  AORTA Ao Root diam: 3.10 cm Ao Asc diam:  3.00 cm  MITRAL VALVE MV Area (PHT): 4.77 cm     SHUNTS MV Decel Time: 159 msec     Systemic VTI:  0.19 m MV E velocity: 72.50 cm/s   Systemic Diam: 1.90 cm MV A velocity: 103.00 cm/s MV E/A ratio:  0.70  Dietrich Pates MD Electronically signed by Dietrich Pates MD Signature Date/Time: 11/12/2022/8:42:45 PM    Final            Recent Labs: 11/08/2022: ALT 19; TSH 0.500 11/11/2022: Magnesium 1.9 11/13/2022: BUN 13; Creatinine, Ser 0.58; Hemoglobin 10.5; Platelets 414; Potassium 3.1; Sodium 132  Recent Lipid Panel    Component Value Date/Time   CHOL 101 05/17/2021 0624   CHOL 266 (H) 02/25/2020 0836   TRIG 65 05/17/2021 0624   HDL 32 (L) 05/17/2021 0624   HDL 51 02/25/2020 0836   CHOLHDL 3.2 05/17/2021 0624   VLDL 13 05/17/2021 0624   LDLCALC 56 05/17/2021 0624   LDLCALC 191 (H) 02/25/2020 0836    History of Present Illness    47 year old female with the above past medical history including  tachyarrhythmia (?PSVT), dizziness, hypertension, hyperlipidemia, type 2 diabetes, bipolar disorder, schizoaffective disorder, and anemia.   She was hospitalized in May 2024 in the setting of DKA.  She had a presyncopal episode, this was suspected to be due to  dehydration.  Cardiology was consulted in the setting of tachyarrhythmia.  Imaging was concerning for PSVT versus atrial tachycardia, HR 120 to 140 bpm.  Echocardiogram showed EF 55%, normal LV function, no RWMA,  normal RV systolic function, no significant valvular abnormalities.  She was started on metoprolol tartrate 50 mg twice daily.  Outpatient follow-up with cardiology and consideration of possible cardiac monitor was recommended.  She presents today for follow-up.  Since her hospitalization  Tachyarrhythmia: Dizziness: Hypertension: Hyperlipidemia: Type 2 diabetes: Disposition:   Home Medications    Current Outpatient Medications  Medication Sig Dispense Refill   amLODipine (NORVASC) 10 MG tablet Take 1 tablet (10 mg total) by mouth daily. 30 tablet 0   atorvastatin (LIPITOR) 80 MG tablet Take 1 tablet (80 mg total) by mouth daily. Elevated cholesterol 30 tablet 0   benztropine (COGENTIN) 0.5 MG tablet Take 1 tablet (0.5 mg total) by mouth 2 (two) times daily. tremors 60 tablet 0   Blood Glucose Monitoring Suppl (BLOOD GLUCOSE MONITOR SYSTEM) w/Device KIT Use 3 (three) times daily. 1 kit 0   docusate sodium (COLACE) 100 MG capsule Take 1 capsule (100 mg total) by mouth 2 (two) times daily. 10 capsule 0   ferrous sulfate 325 (65 FE) MG tablet Take 1 tablet (325 mg total) by mouth daily with breakfast. 30 tablet 0   Glucose Blood (BLOOD GLUCOSE TEST STRIPS) STRP Use as directed to check blood sugar three times daily 100 strip 0   hydrOXYzine (ATARAX) 25 MG tablet Take 1 tablet (25 mg total) by mouth 3 (three) times daily as needed for anxiety. For anxiety 30 tablet 0   insulin isophane & regular human KwikPen (NOVOLIN 70/30 KWIKPEN) (70-30) 100 UNIT/ML KwikPen Inject 50 Units into the skin 2 (two) times daily with a meal. 30 mL 0   Insulin Pen Needle (PEN NEEDLES) 31G X 5 MM MISC Use with insulin 3 (three) times daily. 100 each 0   Lancet Device MISC 1 each by Does not apply route 3  (three) times daily. May dispense any manufacturer covered by patient's insurance. 1 each 0   Lancets MISC Use as directed to check blood sugar 3 times daily. 100 each 0   lisinopril (ZESTRIL) 40 MG tablet Take 1 tablet (40 mg total) by mouth daily. 30 tablet 0   metoCLOPramide (REGLAN) 10 MG tablet Take 1 tablet (10 mg total) by mouth 3 (three) times daily before meals. 90 tablet 0   metoprolol tartrate (LOPRESSOR) 50 MG tablet Take 1 tablet (50 mg total) by mouth 2 (two) times daily. 60 tablet 0   risperiDONE (RISPERDAL M-TABS) 3 MG disintegrating tablet Take 1 tablet (3 mg total) by mouth daily. Mood disorder. 30 tablet 0   risperiDONE (RISPERDAL M-TABS) 4 MG disintegrating tablet Take 1 tablet (4 mg total) by mouth at bedtime. For mood disorder 30 tablet 0   senna-docusate (SENOKOT-S) 8.6-50 MG tablet Take 2 tablets by mouth 2 (two) times daily as needed for moderate constipation. 60 tablet 0   traZODone (DESYREL) 50 MG tablet Take 1 tablet (50 mg total) by mouth at bedtime as needed for sleep. For sleep 30 tablet 0   No current facility-administered medications for this visit.     Review of Systems    ***.  All other systems reviewed and are otherwise negative except as noted above.    Physical Exam    VS:  LMP  (LMP Unknown)  , BMI There is no height or weight on file to calculate BMI.     GEN: Well nourished, well developed, in no acute distress. HEENT: normal. Neck: Supple, no JVD, carotid bruits, or masses. Cardiac: RRR, no murmurs, rubs, or gallops. No clubbing,  cyanosis, edema.  Radials/DP/PT 2+ and equal bilaterally.  Respiratory:  Respirations regular and unlabored, clear to auscultation bilaterally. GI: Soft, nontender, nondistended, BS + x 4. MS: no deformity or atrophy. Skin: warm and dry, no rash. Neuro:  Strength and sensation are intact. Psych: Normal affect.  Accessory Clinical Findings    ECG personally reviewed by me today - *** - no acute changes.   Lab  Results  Component Value Date   WBC 12.8 (H) 11/13/2022   HGB 10.5 (L) 11/13/2022   HCT 35.3 (L) 11/13/2022   MCV 75.6 (L) 11/13/2022   PLT 414 (H) 11/13/2022   Lab Results  Component Value Date   CREATININE 0.58 11/13/2022   BUN 13 11/13/2022   NA 132 (L) 11/13/2022   K 3.1 (L) 11/13/2022   CL 103 11/13/2022   CO2 20 (L) 11/13/2022   Lab Results  Component Value Date   ALT 19 11/08/2022   AST 25 11/08/2022   ALKPHOS 107 11/08/2022   BILITOT 1.3 (H) 11/08/2022   Lab Results  Component Value Date   CHOL 101 05/17/2021   HDL 32 (L) 05/17/2021   LDLCALC 56 05/17/2021   TRIG 65 05/17/2021   CHOLHDL 3.2 05/17/2021    Lab Results  Component Value Date   HGBA1C 11.5 (H) 11/08/2022    Assessment & Plan    1.  ***  No BP recorded.  {Refresh Note OR Click here to enter BP  :1}***   Joylene Grapes, NP 12/04/2022, 6:19 AM

## 2022-12-08 ENCOUNTER — Other Ambulatory Visit: Payer: Self-pay | Admitting: *Deleted

## 2022-12-08 ENCOUNTER — Ambulatory Visit: Payer: Self-pay

## 2022-12-08 MED ORDER — NOVOLIN 70/30 FLEXPEN RELION (70-30) 100 UNIT/ML ~~LOC~~ SUPN: 50.0000 [IU] | PEN_INJECTOR | Freq: Two times a day (BID) | SUBCUTANEOUS | 0 refills | Status: DC

## 2022-12-08 MED ORDER — AMLODIPINE BESYLATE 10 MG PO TABS: 10.0000 mg | ORAL_TABLET | Freq: Every day | ORAL | 0 refills | Status: DC

## 2022-12-08 MED ORDER — METOPROLOL TARTRATE 50 MG PO TABS: 50.0000 mg | ORAL_TABLET | Freq: Two times a day (BID) | ORAL | 0 refills | Status: DC

## 2022-12-08 MED ORDER — LISINOPRIL 40 MG PO TABS: 40.0000 mg | ORAL_TABLET | Freq: Every day | ORAL | 0 refills | Status: DC

## 2022-12-08 NOTE — Telephone Encounter (Signed)
      Chief Complaint: Needs refills, has not called pharmacy. Out of hydroxyzine,benztropine,Paxil,.Needs refills on Novolin 70/30 pen, Reglan, metoprolol, amlodipine, and lisinopril.  Symptoms: n/a Frequency: n/a Pertinent Negatives: Patient denies n/a Disposition: [] ED /[] Urgent Care (no appt availability in office) / [] Appointment(In office/virtual)/ []  [facility]/ [] Home Care/ [] Refused Recommended Disposition /[] [facility]/ [x]  Follow-up with PCP Additional Notes: Please send [facility] her [facility] and notify her when done.  Answer Assessment - Initial Assessment Questions 1. DRUG NAME: "What medicine do you need to have refilled?"     Novolin 70/30,Reglan,metoprolol,amlodipine,lisinopril,benztropine,hydroxyzine,paxil 20 mg 2. REFILLS REMAINING: "How many refills are remaining?" (Note: The label on the medicine or pill bottle will show how many refills are remaining. If there are no refills remaining, then a renewal may be needed.)     0 3. EXPIRATION DATE: "What is the expiration date?" (Note: The label states when the prescription will expire, and thus can no longer be refilled.)     N/a 4. PRESCRIBING HCP: "Who prescribed it?" Reason: If prescribed by specialist, call should be referred to that group.     Dr. Andrey Campanile 5. SYMPTOMS: "Do you have any symptoms?"     na 6. PREGNANCY: "Is there any chance that you are pregnant?" "When was your last menstrual period?"     No  Protocols used: Medication Refill and Renewal Call-A-AH

## 2022-12-11 ENCOUNTER — Telehealth: Payer: Self-pay

## 2022-12-11 NOTE — Telephone Encounter (Signed)
Copied from CRM [id number]. Topic: General - Inquiry >> Dec 08, 2022  3:15 PM De [place]anch wrote: Reason for CRM: Pt is calling to f/u on her medication refills. See the message from NT from 06/07.   Pt is requesting a callback.   Please Advise.  Pt will need to keep appt scheduled for 12/15/22 in order for refills to be completed.

## 2022-12-12 ENCOUNTER — Encounter (HOSPITAL_COMMUNITY): Payer: Self-pay

## 2022-12-12 ENCOUNTER — Other Ambulatory Visit: Payer: Self-pay

## 2022-12-12 ENCOUNTER — Inpatient Hospital Stay (HOSPITAL_COMMUNITY)
Admission: EM | Admit: 2022-12-12 | Discharge: 2022-12-14 | DRG: 074 | Disposition: A | Discharge status: Home / Self Care | Payer: Medicaid Other | Attending: Internal Medicine | Admitting: Internal Medicine

## 2022-12-12 ENCOUNTER — Emergency Department (HOSPITAL_COMMUNITY): Payer: Medicaid Other

## 2022-12-12 DIAGNOSIS — Z5901 Sheltered homelessness: Secondary | ICD-10-CM

## 2022-12-12 DIAGNOSIS — Z87891 Personal history of nicotine dependence: Secondary | ICD-10-CM

## 2022-12-12 DIAGNOSIS — Z833 Family history of diabetes mellitus: Secondary | ICD-10-CM

## 2022-12-12 DIAGNOSIS — R350 Frequency of micturition: Secondary | ICD-10-CM | POA: Diagnosis present

## 2022-12-12 DIAGNOSIS — Z794 Long term (current) use of insulin: Secondary | ICD-10-CM

## 2022-12-12 DIAGNOSIS — E1165 Type 2 diabetes mellitus with hyperglycemia: Secondary | ICD-10-CM | POA: Diagnosis present

## 2022-12-12 DIAGNOSIS — R112 Nausea with vomiting, unspecified: Secondary | ICD-10-CM | POA: Diagnosis present

## 2022-12-12 DIAGNOSIS — I10 Essential (primary) hypertension: Secondary | ICD-10-CM | POA: Diagnosis present

## 2022-12-12 DIAGNOSIS — E7800 Pure hypercholesterolemia, unspecified: Secondary | ICD-10-CM | POA: Diagnosis present

## 2022-12-12 DIAGNOSIS — E872 Acidosis, unspecified: Secondary | ICD-10-CM | POA: Diagnosis present

## 2022-12-12 DIAGNOSIS — F319 Bipolar disorder, unspecified: Secondary | ICD-10-CM | POA: Diagnosis present

## 2022-12-12 DIAGNOSIS — R739 Hyperglycemia, unspecified: Principal | ICD-10-CM

## 2022-12-12 DIAGNOSIS — Z6837 Body mass index (BMI) 37.0-37.9, adult: Secondary | ICD-10-CM

## 2022-12-12 DIAGNOSIS — K3184 Gastroparesis: Secondary | ICD-10-CM | POA: Diagnosis present

## 2022-12-12 DIAGNOSIS — E1143 Type 2 diabetes mellitus with diabetic autonomic (poly)neuropathy: Principal | ICD-10-CM | POA: Diagnosis present

## 2022-12-12 DIAGNOSIS — R1110 Vomiting, unspecified: Secondary | ICD-10-CM

## 2022-12-12 DIAGNOSIS — K580 Irritable bowel syndrome with diarrhea: Secondary | ICD-10-CM | POA: Diagnosis present

## 2022-12-12 DIAGNOSIS — Z79899 Other long term (current) drug therapy: Secondary | ICD-10-CM

## 2022-12-12 DIAGNOSIS — D509 Iron deficiency anemia, unspecified: Secondary | ICD-10-CM | POA: Diagnosis present

## 2022-12-12 LAB — URINALYSIS, ROUTINE W REFLEX MICROSCOPIC
Bacteria, UA: NONE SEEN
Bilirubin Urine: NEGATIVE
Glucose, UA: >=500 mg/dL — AB
Ketones, ur: 20 mg/dL — AB
Nitrite: NEGATIVE
Protein, ur: 30 mg/dL — AB
RBC / HPF: >50 RBC/hpf (ref 0–5)
Specific Gravity, Urine: 1.029 (ref 1.005–1.030)
pH: 7 (ref 5.0–8.0)

## 2022-12-12 LAB — BASIC METABOLIC PANEL
Anion gap: 12 (ref 5–15)
BUN: 10 mg/dL (ref 6–20)
CO2: 20 mmol/L — ABNORMAL LOW (ref 22–32)
Calcium: 9.7 mg/dL (ref 8.9–10.3)
Chloride: 100 mmol/L (ref 98–111)
Creatinine, Ser: 0.6 mg/dL (ref 0.44–1.00)
GFR, Estimated: >60 mL/min (ref >60)
Glucose, Bld: 329 mg/dL — ABNORMAL HIGH (ref 70–99)
Potassium: 4.4 mmol/L (ref 3.5–5.1)
Sodium: 132 mmol/L — ABNORMAL LOW (ref 135–145)

## 2022-12-12 LAB — CBC
HCT: 42.3 % (ref 36.0–46.0)
Hemoglobin: 13.1 g/dL (ref 12.0–15.0)
MCH: 24.3 pg — ABNORMAL LOW (ref 26.0–34.0)
MCHC: 31 g/dL (ref 30.0–36.0)
MCV: 78.5 fL — ABNORMAL LOW (ref 80.0–100.0)
Platelets: 557 10*3/uL — ABNORMAL HIGH (ref 150–400)
RBC: 5.39 MIL/uL — ABNORMAL HIGH (ref 3.87–5.11)
RDW: 18.8 % — ABNORMAL HIGH (ref 11.5–15.5)
WBC: 13.2 10*3/uL — ABNORMAL HIGH (ref 4.0–10.5)
nRBC: 0 % (ref 0.0–0.2)

## 2022-12-12 LAB — RAPID URINE DRUG SCREEN, HOSP PERFORMED
Amphetamines: NOT DETECTED
Barbiturates: NOT DETECTED
Benzodiazepines: NOT DETECTED
Cocaine: NOT DETECTED
Opiates: NOT DETECTED
Tetrahydrocannabinol: POSITIVE — AB

## 2022-12-12 LAB — HEPATIC FUNCTION PANEL
ALT: 20 U/L (ref 0–44)
AST: 15 U/L (ref 15–41)
Albumin: 3.8 g/dL (ref 3.5–5.0)
Alkaline Phosphatase: 82 U/L (ref 38–126)
Bilirubin, Direct: <0.1 mg/dL (ref 0.0–0.2)
Total Bilirubin: 0.7 mg/dL (ref 0.3–1.2)
Total Protein: 8.6 g/dL — ABNORMAL HIGH (ref 6.5–8.1)

## 2022-12-12 LAB — I-STAT BETA HCG BLOOD, ED (MC, WL, AP ONLY): I-stat hCG, quantitative: <5 m[IU]/mL (ref <5)

## 2022-12-12 LAB — CBG MONITORING, ED
Glucose-Capillary: 386 mg/dL — ABNORMAL HIGH (ref 70–99)
Glucose-Capillary: 307 mg/dL — ABNORMAL HIGH (ref 70–99)
Glucose-Capillary: 330 mg/dL — ABNORMAL HIGH (ref 70–99)

## 2022-12-12 LAB — BETA-HYDROXYBUTYRIC ACID: Beta-Hydroxybutyric Acid: 0.5 mmol/L — ABNORMAL HIGH (ref 0.05–0.27)

## 2022-12-12 MED ORDER — AMLODIPINE BESYLATE 5 MG PO TABS
10.0000 mg | ORAL_TABLET | Freq: Once | ORAL | Status: AC
  Administered 2022-12-12: 10 mg via ORAL
  Filled 2022-12-12: qty 2

## 2022-12-12 MED ORDER — ONDANSETRON HCL 4 MG/2ML IJ SOLN
4.0000 mg | Freq: Once | INTRAMUSCULAR | Status: AC
  Administered 2022-12-12: 4 mg via INTRAVENOUS
  Filled 2022-12-12: qty 2

## 2022-12-12 MED ORDER — METOCLOPRAMIDE HCL 5 MG/ML IJ SOLN
10.0000 mg | Freq: Once | INTRAMUSCULAR | Status: AC
  Administered 2022-12-12: 10 mg via INTRAVENOUS
  Filled 2022-12-12: qty 2

## 2022-12-12 MED ORDER — LORAZEPAM 2 MG/ML PO CONC: 0.5000 mg | ORAL | Status: DC

## 2022-12-12 MED ORDER — LORAZEPAM 2 MG/ML IJ SOLN
0.5000 mg | Freq: Once | INTRAMUSCULAR | Status: AC
  Administered 2022-12-12: 0.5 mg via INTRAVENOUS
  Filled 2022-12-12: qty 1

## 2022-12-12 MED ORDER — LACTATED RINGERS IV BOLUS
1000.0000 mL | Freq: Once | INTRAVENOUS | Status: AC
  Administered 2022-12-12: 1000 mL via INTRAVENOUS

## 2022-12-12 MED ORDER — NOVOLIN 70/30 FLEXPEN RELION (70-30) 100 UNIT/ML ~~LOC~~ SUPN
50.0000 [IU] | PEN_INJECTOR | Freq: Two times a day (BID) | SUBCUTANEOUS | 0 refills | Status: DC
  Filled 2022-12-12: qty 30, 30d supply, fill #0

## 2022-12-12 MED ORDER — IOHEXOL 300 MG/ML  SOLN
100.0000 mL | Freq: Once | INTRAMUSCULAR | Status: AC | PRN
  Administered 2022-12-12: 100 mL via INTRAVENOUS

## 2022-12-12 MED ORDER — INSULIN ASPART 100 UNIT/ML IJ SOLN
10.0000 [IU] | Freq: Once | INTRAMUSCULAR | Status: AC
  Administered 2022-12-12: 10 [IU] via SUBCUTANEOUS
  Filled 2022-12-12: qty 0.1

## 2022-12-12 MED ORDER — DROPERIDOL 2.5 MG/ML IJ SOLN
1.2500 mg | Freq: Once | INTRAMUSCULAR | Status: AC
  Administered 2022-12-12: 1.25 mg via INTRAVENOUS
  Filled 2022-12-12: qty 2

## 2022-12-12 NOTE — ED Notes (Signed)
US PIV placed. 

## 2022-12-12 NOTE — Social Work (Signed)
CSW spoke with the patient in regards to the medication/ insulin. At this time patient reports just not having a prescription for the medication. This CSW has reported this to the [facility]. At this time the provider will provider the patient a prescription for the patient to receive in AM. Patient can afford medication. There are no further TOC needs at this time.

## 2022-12-12 NOTE — ED Triage Notes (Signed)
Patient brought in by EMS due to hyperglycemia. Pt reports being out of her insulin for about a week and relocating to [place]. Pt reports that she has been having frequent urination and elevated sugars. Pt tearful in triage.

## 2022-12-12 NOTE — ED Notes (Signed)
Pt's daughter to registration desk stating that the pt "isn't doing well" and requesting for someone to come check her. Triage RN aware.

## 2022-12-12 NOTE — ED Provider Notes (Signed)
[place] EMERGENCY DEPARTMENT AT [facility] Provider Note   CSN: [id number] Arrival date & time: 12/12/22  1744     History  Chief Complaint  Patient presents with   Hyperglycemia    [name] is a 47 y.o. female with medical history of bipolar 1 disorder, diabetes, hypertension, fibroids.  Patient presents to ED for evaluation of hyperglycemia.  She states that she has been out of her insulin for the last 1 week secondary to insurance issues.  She states that today she began having nausea, vomiting and diarrhea.  She also states that she is currently on her menstrual cycle.  She denies any fevers, chest pain, shortness of breath.  She denies any polyuria, polydipsia, polyphagia.  She reports that her sugars typically are between 100 and 200 at home when she has her insulin.  She is unsure of her last hemoglobin A1c.  She states that she typically takes 50 units in the morning and 50 units at night of insulin.   Hyperglycemia Associated symptoms: nausea and vomiting        Home Medications Prior to Admission medications   Medication Sig Start Date End Date Taking? Authorizing Provider  amLODipine (NORVASC) 10 MG tablet Take 1 tablet (10 mg total) by mouth daily. 12/08/22   Georganna Skeans, MD  atorvastatin (LIPITOR) 80 MG tablet Take 1 tablet (80 mg total) by mouth daily. Elevated cholesterol 06/01/21 11/08/22  Starleen Blue, NP  benztropine (COGENTIN) 0.5 MG tablet Take 1 tablet (0.5 mg total) by mouth 2 (two) times daily. tremors 06/01/21   Starleen Blue, NP  Blood Glucose Monitoring Suppl (BLOOD GLUCOSE MONITOR SYSTEM) w/Device KIT Use 3 (three) times daily. 11/13/22   Amin, Loura Halt, MD  docusate sodium (COLACE) 100 MG capsule Take 1 capsule (100 mg total) by mouth 2 (two) times daily. 11/13/22   Amin, Loura Halt, MD  ferrous sulfate 325 (65 FE) MG tablet Take 1 tablet (325 mg total) by mouth daily with breakfast. 11/14/22   Amin, Loura Halt, MD  Glucose Blood  (BLOOD GLUCOSE TEST STRIPS) STRP Use as directed to check blood sugar three times daily 11/13/22   Amin, Loura Halt, MD  hydrOXYzine (ATARAX) 25 MG tablet Take 1 tablet (25 mg total) by mouth 3 (three) times daily as needed for anxiety. For anxiety 06/01/21   Starleen Blue, NP  insulin isophane & regular human KwikPen (NOVOLIN 70/30 KWIKPEN) (70-30) 100 UNIT/ML KwikPen Inject 50 Units into the skin 2 (two) times daily with a meal. 12/12/22 01/11/23  Al Decant, PA-C  Insulin Pen Needle (PEN NEEDLES) 31G X 5 MM MISC Use with insulin 3 (three) times daily. 11/13/22   Amin, Loura Halt, MD  Lancet Device MISC 1 each by Does not apply route 3 (three) times daily. May dispense any manufacturer covered by patient's insurance. 11/13/22   Amin, Loura Halt, MD  Lancets MISC Use as directed to check blood sugar 3 times daily. 11/13/22   Amin, Loura Halt, MD  lisinopril (ZESTRIL) 40 MG tablet Take 1 tablet (40 mg total) by mouth daily. 12/08/22   Georganna Skeans, MD  metoCLOPramide (REGLAN) 10 MG tablet Take 1 tablet (10 mg total) by mouth 3 (three) times daily before meals. 11/13/22 12/13/22  Amin, Loura Halt, MD  metoprolol tartrate (LOPRESSOR) 50 MG tablet Take 1 tablet (50 mg total) by mouth 2 (two) times daily. 12/08/22   Georganna Skeans, MD  risperiDONE (RISPERDAL M-TABS) 3 MG disintegrating tablet Take 1 tablet (3 mg  total) by mouth daily. Mood disorder. 06/02/21   Starleen Blue, NP  risperiDONE (RISPERDAL M-TABS) 4 MG disintegrating tablet Take 1 tablet (4 mg total) by mouth at bedtime. For mood disorder 06/01/21   Starleen Blue, NP  senna-docusate (SENOKOT-S) 8.6-50 MG tablet Take 2 tablets by mouth 2 (two) times daily as needed for moderate constipation. 11/13/22   Amin, Loura Halt, MD  traZODone (DESYREL) 50 MG tablet Take 1 tablet (50 mg total) by mouth at bedtime as needed for sleep. For sleep 06/01/21   Starleen Blue, NP      Allergies    Patient has no known allergies.    Review of  Systems   Review of Systems  Gastrointestinal:  Positive for diarrhea, nausea and vomiting.  All other systems reviewed and are negative.   Physical Exam Updated Vital Signs BP (!) 180/89   Pulse 92   Temp 98.5 F (36.9 C) (Oral)   Resp 18   Ht 6' (1.829 m)   Wt 126 kg   LMP  (LMP Unknown)   SpO2 98%   BMI 37.67 kg/m  Physical Exam Vitals and nursing note reviewed.  Constitutional:      General: She is not in acute distress.    Appearance: She is not ill-appearing, toxic-appearing or diaphoretic.  HENT:     Head: Normocephalic and atraumatic.     Nose: Nose normal.     Mouth/Throat:     Mouth: Mucous membranes are moist.     Pharynx: Oropharynx is clear.  Eyes:     Extraocular Movements: Extraocular movements intact.     Conjunctiva/sclera: Conjunctivae normal.     Pupils: Pupils are equal, round, and reactive to light.  Cardiovascular:     Rate and Rhythm: Normal rate and regular rhythm.  Pulmonary:     Effort: Pulmonary effort is normal.     Breath sounds: Normal breath sounds. No wheezing.  Abdominal:     General: Abdomen is flat. Bowel sounds are normal.     Palpations: Abdomen is soft.     Tenderness: There is no abdominal tenderness.  Musculoskeletal:     Cervical back: Normal range of motion and neck supple. No tenderness.  Skin:    General: Skin is warm and dry.     Capillary Refill: Capillary refill takes less than 2 seconds.  Neurological:     Mental Status: She is alert and oriented to person, place, and time.     ED Results / Procedures / Treatments   Labs (all labs ordered are listed, but only abnormal results are displayed) Labs Reviewed  BASIC METABOLIC PANEL - Abnormal; Notable for the following components:      Result Value   Sodium 132 (*)    CO2 20 (*)    Glucose, Bld 329 (*)    All other components within normal limits  CBC - Abnormal; Notable for the following components:   WBC 13.2 (*)    RBC 5.39 (*)    MCV 78.5 (*)    MCH 24.3  (*)    RDW 18.8 (*)    Platelets 557 (*)    All other components within normal limits  URINALYSIS, ROUTINE W REFLEX MICROSCOPIC - Abnormal; Notable for the following components:   APPearance HAZY (*)    Glucose, UA >=500 (*)    Hgb urine dipstick LARGE (*)    Ketones, ur 20 (*)    Protein, ur 30 (*)    Leukocytes,Ua TRACE (*)  All other components within normal limits  HEPATIC FUNCTION PANEL - Abnormal; Notable for the following components:   Total Protein 8.6 (*)    All other components within normal limits  BETA-HYDROXYBUTYRIC ACID - Abnormal; Notable for the following components:   Beta-Hydroxybutyric Acid 0.50 (*)    All other components within normal limits  CBG MONITORING, ED - Abnormal; Notable for the following components:   Glucose-Capillary 386 (*)    All other components within normal limits  CBG MONITORING, ED - Abnormal; Notable for the following components:   Glucose-Capillary 307 (*)    All other components within normal limits  CBG MONITORING, ED - Abnormal; Notable for the following components:   Glucose-Capillary 330 (*)    All other components within normal limits  I-STAT BETA HCG BLOOD, ED ([facility], [facility], [facility] ONLY)  CBG MONITORING, ED    EKG None  Radiology No results found.  Procedures Procedures   Medications Ordered in ED Medications  lactated ringers bolus 1,000 mL (has no administration in time range)  lactated ringers bolus 1,000 mL (1,000 mLs Intravenous New Bag/Given 12/12/22 2051)  metoCLOPramide (REGLAN) injection 10 mg (10 mg Intravenous Given 12/12/22 2051)  insulin aspart (novoLOG) injection 10 Units (10 Units Subcutaneous Given 12/12/22 2100)  LORazepam (ATIVAN) injection 0.5 mg (0.5 mg Intravenous Given 12/12/22 2100)  amLODipine (NORVASC) tablet 10 mg (10 mg Oral Given 12/12/22 2208)    ED Course/ Medical Decision Making/ A&P    Medical Decision Making Amount and/or Complexity of Data Reviewed Labs: ordered.  Risk Prescription drug  management.   47 year old female presents to ED for evaluation of hyperglycemia, nausea, vomiting and diarrhea.  Please see HPI for further details.  On examination patient is afebrile and nontachycardic.  Lung sounds are clear bilaterally and she is not hypoxic.  Abdomen soft and compressible.  Neurological examination at baseline.  Oriented x 3.  Patient initial CBG shows 386.  Patient CBC shows leukocytosis of 13.2, no anemia.  Metabolic panel shows sodium 132, glucose 329, anion gap of 12.  Beta hydroxybutyrate acid is 0.5.  Urinalysis shows large hemoglobin, glucose, ketones and protein, leukocytes.  Patient is currently in a menstrual cycle.  Repeat CBG testing shows CBG of 307 after 1 L fluid, 10 units of insulin administered.  Patient was given 10 mg Reglan for nausea.  She denies any nausea now.  Patient blood pressure noted to be elevated, she reports that she did take her blood pressure medication this morning but she vomited it back up.  She was given her home blood pressure medication here 10 mg amlodipine.  On reassessment patient blood sugar, it is elevated back to 330.  Will provide the patient another liter of fluid and then recheck her sugar.  I have reached out to social work.  They have returned my call and stated that the patient is able to afford her insulin and that she should have her insulin sent to the [facility] outpatient pharmacy.  This has been done.  At end my shift, the patient will have her sugar rechecked in 1 hour.  She was given home blood pressure medication.  Signed out to oncoming provider [employer].   Final Clinical Impression(s) / ED Diagnoses Final diagnoses:  Hyperglycemia    Rx / DC Orders ED Discharge Orders          Ordered    insulin isophane & regular human KwikPen (NOVOLIN 70/30 KWIKPEN) (70-30) 100 UNIT/ML KwikPen  2 times daily with  meals       Note to Pharmacy: To be filled at a [facility].   12/12/22 2216               Al Decant, PA-C 12/12/22 2224    Virgina Norfolk, DO 12/12/22 2237

## 2022-12-12 NOTE — ED Notes (Signed)
Pt resting comfortably in bed. Pt denies any pain and resting comfortably, will continue to monitor.

## 2022-12-12 NOTE — ED Provider Notes (Signed)
Patient was received at shift change from Christopter Gross PA-C please see his note for full detail  Patient with medical history including hypertension, IBS, diabetes, presenting with nausea vomiting diarrhea that started today.  Patient states that she has been without her diabetes medication for the last week, states that she just moved from [place] and is currently living in a hotel, has been unable to get her medications approved.  She states that she is not having any stomach pains, denies any bloody emesis or coffee-ground emesis she denies any bloody stools or dark tarry stools, states that she has never had this happen to her in the past, states that she has had tubal ligation cholecystectomy, she denies any excessive alcohol use or NSAID use she denies any marijuana use.  She currentlystates that she just feels unwell but does not have any actual pain.  She states that she has had DKA in the past this feels similar.  Per previous provider reassessed patient for possible discharge. Physical Exam  BP (!) 174/87   Pulse 90   Temp 98.5 F (36.9 C) (Oral)   Resp 15   Ht 6' (1.829 m)   Wt 126 kg   LMP  (LMP Unknown)   SpO2 98%   BMI 37.67 kg/m   Physical Exam Vitals and nursing note reviewed.  Constitutional:      General: She is not in acute distress.    Appearance: She is diaphoretic. She is not ill-appearing.  HENT:     Head: Normocephalic and atraumatic.     Comments: No deformity of the head present no raccoon eyes or Battle sign noted.    Nose: No congestion.  Eyes:     Conjunctiva/sclera: Conjunctivae normal.  Cardiovascular:     Rate and Rhythm: Normal rate and regular rhythm.     Pulses: Normal pulses.     Heart sounds: No murmur heard.    No friction rub. No gallop.  Pulmonary:     Effort: No respiratory distress.     Breath sounds: No wheezing, rhonchi or rales.  Abdominal:     Palpations: Abdomen is soft.     Tenderness: There is no abdominal tenderness.  There is no right CVA tenderness or left CVA tenderness.     Comments: Abdomen nondistended, soft no guarding or rebound is a peritoneal sign no emergency or McBurney point.  Musculoskeletal:     Right lower leg: No edema.     Left lower leg: No edema.     Comments: Spine was palpated was nontender to palpation no step-off deformities noted no pelvis instability no leg shortening.  Moving her upper and lower extremities out difficulty.  Skin:    General: Skin is warm.  Neurological:     Mental Status: She is alert.     Comments: No facial asymmetry no difficulty with word finding following two-step commands there is no regular weakness present.  Psychiatric:        Mood and Affect: Mood normal.     Procedures  Procedures  ED Course / MDM    Medical Decision Making Amount and/or Complexity of Data Reviewed Labs: ordered. Radiology: ordered.  Risk OTC drugs. Prescription drug management.     Lab Tests:  I Ordered, and personally interpreted labs.  The pertinent results include: CBC shows leukocytosis of 13.2, BMP reveals sodium 132 CO2 of 20, glucose 329, hepatic panel shows total protein of 8.6 UA shows ketones proteins trace leukocytes red blood cells white blood cells  no bacteria CBG 330   Imaging Studies ordered:  I ordered imaging studies including CT ab pelvis, dg chest I independently visualized and interpreted imaging which showed *** I agree with the radiologist interpretation   Cardiac Monitoring:  The patient was maintained on a cardiac monitor.  I personally viewed and interpreted the cardiac monitored which showed an underlying rhythm of: Without signs of ischemia   Medicines ordered and prescription drug management:  I ordered medication including droperidol I have reviewed the patients home medicines and have made adjustments as needed  Critical Interventions:  ***   Reevaluation:  I assessed the patient she is a difficult historian, but on  my exam, vomiting, hypertensive, abdomen was soft nontender, due to continuous nausea vomiting will obtain CT imaging for further evaluation, also add on troponins, chest x-ray, VBG for further assessment.  Consultations Obtained:  I requested consultation with the ***,  and discussed lab and imaging findings as well as pertinent plan - they recommend: ***    Test Considered:  ***    Rule out ****    Dispostion and problem list  After consideration of the diagnostic results and the patients response to treatment, I feel that the patent would benefit from ***.

## 2022-12-13 ENCOUNTER — Other Ambulatory Visit: Payer: Self-pay

## 2022-12-13 ENCOUNTER — Other Ambulatory Visit (HOSPITAL_COMMUNITY): Payer: Self-pay

## 2022-12-13 DIAGNOSIS — R1110 Vomiting, unspecified: Secondary | ICD-10-CM | POA: Diagnosis present

## 2022-12-13 DIAGNOSIS — Z5901 Sheltered homelessness: Secondary | ICD-10-CM | POA: Diagnosis not present

## 2022-12-13 DIAGNOSIS — R350 Frequency of micturition: Secondary | ICD-10-CM | POA: Diagnosis present

## 2022-12-13 DIAGNOSIS — E1143 Type 2 diabetes mellitus with diabetic autonomic (poly)neuropathy: Secondary | ICD-10-CM | POA: Diagnosis present

## 2022-12-13 DIAGNOSIS — Z833 Family history of diabetes mellitus: Secondary | ICD-10-CM | POA: Diagnosis not present

## 2022-12-13 DIAGNOSIS — Z6837 Body mass index (BMI) 37.0-37.9, adult: Secondary | ICD-10-CM | POA: Diagnosis not present

## 2022-12-13 DIAGNOSIS — F319 Bipolar disorder, unspecified: Secondary | ICD-10-CM | POA: Diagnosis present

## 2022-12-13 DIAGNOSIS — R112 Nausea with vomiting, unspecified: Secondary | ICD-10-CM | POA: Diagnosis not present

## 2022-12-13 DIAGNOSIS — K580 Irritable bowel syndrome with diarrhea: Secondary | ICD-10-CM | POA: Diagnosis present

## 2022-12-13 DIAGNOSIS — E872 Acidosis, unspecified: Secondary | ICD-10-CM | POA: Diagnosis present

## 2022-12-13 DIAGNOSIS — K3184 Gastroparesis: Secondary | ICD-10-CM | POA: Diagnosis present

## 2022-12-13 DIAGNOSIS — I10 Essential (primary) hypertension: Secondary | ICD-10-CM | POA: Diagnosis present

## 2022-12-13 DIAGNOSIS — Z87891 Personal history of nicotine dependence: Secondary | ICD-10-CM | POA: Diagnosis not present

## 2022-12-13 DIAGNOSIS — Z794 Long term (current) use of insulin: Secondary | ICD-10-CM | POA: Diagnosis not present

## 2022-12-13 DIAGNOSIS — Z79899 Other long term (current) drug therapy: Secondary | ICD-10-CM | POA: Diagnosis not present

## 2022-12-13 DIAGNOSIS — D509 Iron deficiency anemia, unspecified: Secondary | ICD-10-CM | POA: Diagnosis present

## 2022-12-13 DIAGNOSIS — E7800 Pure hypercholesterolemia, unspecified: Secondary | ICD-10-CM | POA: Diagnosis present

## 2022-12-13 DIAGNOSIS — E1165 Type 2 diabetes mellitus with hyperglycemia: Secondary | ICD-10-CM | POA: Diagnosis present

## 2022-12-13 LAB — CBG MONITORING, ED
Glucose-Capillary: 348 mg/dL — ABNORMAL HIGH (ref 70–99)
Glucose-Capillary: 341 mg/dL — ABNORMAL HIGH (ref 70–99)
Glucose-Capillary: 332 mg/dL — ABNORMAL HIGH (ref 70–99)

## 2022-12-13 LAB — BASIC METABOLIC PANEL
Anion gap: 13 (ref 5–15)
BUN: 11 mg/dL (ref 6–20)
CO2: 20 mmol/L — ABNORMAL LOW (ref 22–32)
Calcium: 9.1 mg/dL (ref 8.9–10.3)
Chloride: 97 mmol/L — ABNORMAL LOW (ref 98–111)
Creatinine, Ser: 0.77 mg/dL (ref 0.44–1.00)
GFR, Estimated: >60 mL/min (ref >60)
Glucose, Bld: 331 mg/dL — ABNORMAL HIGH (ref 70–99)
Potassium: 4.3 mmol/L (ref 3.5–5.1)
Sodium: 130 mmol/L — ABNORMAL LOW (ref 135–145)

## 2022-12-13 LAB — CBC
HCT: 38.2 % (ref 36.0–46.0)
Hemoglobin: 12 g/dL (ref 12.0–15.0)
MCH: 24.6 pg — ABNORMAL LOW (ref 26.0–34.0)
MCHC: 31.4 g/dL (ref 30.0–36.0)
MCV: 78.3 fL — ABNORMAL LOW (ref 80.0–100.0)
Platelets: 448 10*3/uL — ABNORMAL HIGH (ref 150–400)
RBC: 4.88 MIL/uL (ref 3.87–5.11)
RDW: 18.6 % — ABNORMAL HIGH (ref 11.5–15.5)
WBC: 11.3 10*3/uL — ABNORMAL HIGH (ref 4.0–10.5)
nRBC: 0 % (ref 0.0–0.2)

## 2022-12-13 LAB — TROPONIN I (HIGH SENSITIVITY): Troponin I (High Sensitivity): <2 ng/L (ref <18)

## 2022-12-13 LAB — GLUCOSE, CAPILLARY
Glucose-Capillary: 215 mg/dL — ABNORMAL HIGH (ref 70–99)
Glucose-Capillary: 187 mg/dL — ABNORMAL HIGH (ref 70–99)
Glucose-Capillary: 166 mg/dL — ABNORMAL HIGH (ref 70–99)

## 2022-12-13 LAB — BLOOD GAS, VENOUS
Acid-Base Excess: 0.8 mmol/L (ref 0.0–2.0)
Bicarbonate: 25.3 mmol/L (ref 20.0–28.0)
O2 Saturation: 59.1 %
Patient temperature: 37
pCO2, Ven: 39 mmHg — ABNORMAL LOW (ref 44–60)
pH, Ven: 7.42 (ref 7.25–7.43)
pO2, Ven: 35 mmHg (ref 32–45)

## 2022-12-13 LAB — PHOSPHORUS: Phosphorus: 3.8 mg/dL (ref 2.5–4.6)

## 2022-12-13 LAB — MAGNESIUM: Magnesium: 1.9 mg/dL (ref 1.7–2.4)

## 2022-12-13 MED ORDER — AMLODIPINE BESYLATE 10 MG PO TABS
10.0000 mg | ORAL_TABLET | Freq: Every day | ORAL | Status: DC
  Administered 2022-12-14: 10 mg via ORAL
  Filled 2022-12-13 (×2): qty 1

## 2022-12-13 MED ORDER — INSULIN ASPART 100 UNIT/ML IJ SOLN
0.0000 [IU] | Freq: Three times a day (TID) | INTRAMUSCULAR | Status: DC
  Administered 2022-12-13: 2 [IU] via SUBCUTANEOUS
  Administered 2022-12-13: 3 [IU] via SUBCUTANEOUS
  Administered 2022-12-14: 2 [IU] via SUBCUTANEOUS
  Administered 2022-12-14: 3 [IU] via SUBCUTANEOUS

## 2022-12-13 MED ORDER — METOCLOPRAMIDE HCL 10 MG PO TABS
10.0000 mg | ORAL_TABLET | Freq: Three times a day (TID) | ORAL | Status: DC
  Administered 2022-12-13 – 2022-12-14 (×3): 10 mg via ORAL
  Filled 2022-12-13 (×4): qty 1

## 2022-12-13 MED ORDER — LORAZEPAM 2 MG/ML IJ SOLN
0.5000 mg | INTRAMUSCULAR | Status: AC
  Administered 2022-12-13: 0.5 mg via INTRAVENOUS
  Filled 2022-12-13: qty 1

## 2022-12-13 MED ORDER — METOPROLOL TARTRATE 50 MG PO TABS
50.0000 mg | ORAL_TABLET | Freq: Two times a day (BID) | ORAL | Status: DC
  Administered 2022-12-13 – 2022-12-14 (×2): 50 mg via ORAL
  Filled 2022-12-13 (×2): qty 1

## 2022-12-13 MED ORDER — LISINOPRIL 20 MG PO TABS
40.0000 mg | ORAL_TABLET | Freq: Every day | ORAL | Status: DC
  Administered 2022-12-14: 40 mg via ORAL
  Filled 2022-12-13 (×2): qty 2

## 2022-12-13 MED ORDER — INSULIN ASPART PROT & ASPART (70-30 MIX) 100 UNIT/ML ~~LOC~~ SUSP
25.0000 [IU] | Freq: Two times a day (BID) | SUBCUTANEOUS | Status: DC
  Administered 2022-12-13 (×2): 25 [IU] via SUBCUTANEOUS
  Filled 2022-12-13: qty 10

## 2022-12-13 MED ORDER — PROCHLORPERAZINE EDISYLATE 10 MG/2ML IJ SOLN
5.0000 mg | INTRAMUSCULAR | Status: AC | PRN
  Administered 2022-12-13 – 2022-12-14 (×4): 5 mg via INTRAVENOUS
  Filled 2022-12-13 (×4): qty 2

## 2022-12-13 MED ORDER — INSULIN ASPART 100 UNIT/ML IJ SOLN: 0.0000 [IU] | Freq: Every day | INTRAMUSCULAR | Status: DC

## 2022-12-13 MED ORDER — INSULIN ASPART 100 UNIT/ML IJ SOLN
0.0000 [IU] | INTRAMUSCULAR | Status: DC
  Administered 2022-12-13 (×2): 15 [IU] via SUBCUTANEOUS
  Filled 2022-12-13: qty 0.2

## 2022-12-13 MED ORDER — PROCHLORPERAZINE EDISYLATE 10 MG/2ML IJ SOLN
10.0000 mg | INTRAMUSCULAR | Status: AC
  Administered 2022-12-13: 10 mg via INTRAVENOUS
  Filled 2022-12-13: qty 2

## 2022-12-13 MED ORDER — LORAZEPAM 2 MG/ML IJ SOLN
0.5000 mg | Freq: Four times a day (QID) | INTRAMUSCULAR | Status: DC | PRN
  Administered 2022-12-13 – 2022-12-14 (×3): 0.5 mg via INTRAVENOUS
  Filled 2022-12-13 (×3): qty 1

## 2022-12-13 MED ORDER — METOCLOPRAMIDE HCL 5 MG/ML IJ SOLN
10.0000 mg | INTRAMUSCULAR | Status: AC
  Administered 2022-12-13: 10 mg via INTRAVENOUS
  Filled 2022-12-13: qty 2

## 2022-12-13 MED ORDER — ONDANSETRON HCL 4 MG/2ML IJ SOLN
4.0000 mg | Freq: Four times a day (QID) | INTRAMUSCULAR | Status: DC | PRN
  Administered 2022-12-13: 4 mg via INTRAVENOUS
  Filled 2022-12-13: qty 2

## 2022-12-13 MED ORDER — HYDRALAZINE HCL 20 MG/ML IJ SOLN
5.0000 mg | Freq: Four times a day (QID) | INTRAMUSCULAR | Status: DC | PRN
  Administered 2022-12-13 (×2): 5 mg via INTRAVENOUS
  Filled 2022-12-13 (×2): qty 1

## 2022-12-13 MED ORDER — ENOXAPARIN SODIUM 40 MG/0.4ML IJ SOSY
40.0000 mg | PREFILLED_SYRINGE | INTRAMUSCULAR | Status: DC
  Filled 2022-12-13: qty 0.4

## 2022-12-13 MED ORDER — SODIUM CHLORIDE 0.9 % IV SOLN: INTRAVENOUS | Status: AC

## 2022-12-13 NOTE — Progress Notes (Signed)
Chaplain provided [name] with Scientist, water quality, Healthcare POA paperwork. She did not want to do that paperwork now. Chaplain provided support and explained the purpose of the document.   [name] did not have any needs at this time but to call her mom. She was able to reach her mom by the phone in the room.   12/13/22 1000  Spiritual Encounters  Type of Visit Initial  Reason for visit Advance directives

## 2022-12-13 NOTE — ED Notes (Signed)
ED TO INPATIENT HANDOFF REPORT  Name/Age/Gender [name] 47 y.o. female  Code Status Code Status History     Date Active Date Inactive Code Status Order ID Comments User Context   11/08/2022 2059 11/13/2022 1632 Full Code [id number]  Therisa Doyne, MD ED   02/15/2022 2245 02/17/2022 1625 Full Code [id number]  Arthor Captain, PA-C ED   01/31/2022 1232 02/01/2022 2154 Full Code [id number]  Maia Plan, MD ED   05/16/2021 2120 06/01/2021 1737 Full Code [id number]  Rankin, Rada Hay, NP Inpatient   05/12/2021 1117 05/16/2021 1753 Full Code [id number]  Virgina Norfolk, DO ED   08/17/2020 0740 08/20/2020 1838 Full Code [id number]  Jesse Sans, MD Inpatient   08/13/2020 1456 08/16/2020 2159 Full Code [id number]  Gailen Shelter, PA ED   08/08/2020 0555 08/09/2020 2325 Full Code [id number]  Nira Conn, MD ED   05/18/2018 1947 05/21/2018 2034 Full Code [id number]  Charm Rings, NP Inpatient   05/17/2018 1609 05/18/2018 1640 Full Code [id number]  Raeford Razor, MD ED   02/25/2017 1835 03/05/2017 1501 Full Code [id number]  Charm Rings, NP Inpatient   02/25/2017 0351 02/25/2017 1819 Full Code [id number]  Liberty Handy, PA-C ED   08/04/2016 1946 08/14/2016 1522 Full Code [id number]  Laveda Abbe, NP Inpatient   01/09/2016 2259 01/10/2016 1746 Full Code [id number]  Melton Krebs, PA-C ED   12/17/2015 2252 12/18/2015 1741 Full Code [id number]  Rolland Porter, MD ED    Questions for Most Recent Historical Code Status (Order [id number])     Question Answer   By: Consent: discussion documented in EHR            Home/SNF/Other Home  Chief Complaint Intractable nausea and vomiting [R11.2]  Level of Care/Admitting Diagnosis ED Disposition     ED Disposition  Admit   Condition  --   Comment  Hospital Area: [facility] [[id number]]  Level of Care: Telemetry [5]  Admit to tele based on following criteria: Monitor for Ischemic changes  May place patient in  observation at [facility] or [facility] if equivalent level of care is available:: Yes  Covid Evaluation: Asymptomatic - no recent exposure (last 10 days) testing not required  Diagnosis: Intractable nausea and vomiting [720114]  Admitting Physician: Darlin Drop [[id number]]  Attending Physician: Darlin Drop [[id number]]          Medical History Past Medical History:  Diagnosis Date   Abnormal Pap smear    Bipolar 1 disorder (HCC)    Diabetes mellitus without complication (HCC)    Fibroids    Hypertension    IBS (irritable bowel syndrome)     Allergies No Known Allergies  IV Location/Drains/Wounds Patient Lines/Drains/Airways Status     Active Line/Drains/Airways     Name Placement date Placement time Site Days   Peripheral IV 12/12/22 20 G 1.88" Anterior;Distal;Left;Upper Arm 12/12/22  2049  Arm  1            Labs/Imaging Results for orders placed or performed during the hospital encounter of 12/12/22 (from the past 48 hour(s))  CBG monitoring, ED     Status: Abnormal   Collection Time: 12/12/22  5:51 PM  Result Value Ref Range   Glucose-Capillary 386 (H) 70 - 99 mg/dL    Comment: Glucose reference range applies only to samples taken after fasting for at least 8 hours.  Basic metabolic panel     Status: Abnormal   Collection Time: 12/12/22  6:23 PM  Result Value Ref Range   Sodium 132 (L) 135 - 145 mmol/L   Potassium 4.4 3.5 - 5.1 mmol/L   Chloride 100 98 - 111 mmol/L   CO2 20 (L) 22 - 32 mmol/L   Glucose, Bld 329 (H) 70 - 99 mg/dL    Comment: Glucose reference range applies only to samples taken after fasting for at least 8 hours.   BUN 10 6 - 20 mg/dL   Creatinine, Ser 8.29 0.44 - 1.00 mg/dL   Calcium 9.7 8.9 - 56.2 mg/dL   GFR, Estimated >13 >08 mL/min    Comment: (NOTE) Calculated using the CKD-EPI Creatinine Equation (2021)    Anion gap 12 5 - 15    Comment: Performed at [facility], [address] [place], [place] [address]   CBC     Status: Abnormal   Collection Time: 12/12/22  6:23 PM  Result Value Ref Range   WBC 13.2 (H) 4.0 - 10.5 K/uL   RBC 5.39 (H) 3.87 - 5.11 MIL/uL   Hemoglobin 13.1 12.0 - 15.0 g/dL   HCT 69.6 29.5 - 28.4 %   MCV 78.5 (L) 80.0 - 100.0 fL   MCH 24.3 (L) 26.0 - 34.0 pg   MCHC 31.0 30.0 - 36.0 g/dL   RDW 13.2 (H) 44.0 - 10.2 %   Platelets 557 (H) 150 - 400 K/uL   nRBC 0.0 0.0 - 0.2 %    Comment: Performed at [facility], [address], [place], [place] [address]  I-Stat beta hCG blood, ED     Status: None   Collection Time: 12/12/22  6:36 PM  Result Value Ref Range   I-stat hCG, quantitative <5.0 <5 mIU/mL   Comment 3            Comment:   GEST. AGE      CONC.  (mIU/mL)   <=1 WEEK        5 - 50     2 WEEKS       50 - 500     3 WEEKS       100 - 10,000     4 WEEKS     1,000 - 30,000        FEMALE AND NON-PREGNANT FEMALE:     LESS THAN 5 mIU/mL   Hepatic function panel     Status: Abnormal   Collection Time: 12/12/22  8:54 PM  Result Value Ref Range   Total Protein 8.6 (H) 6.5 - 8.1 g/dL   Albumin 3.8 3.5 - 5.0 g/dL   AST 15 15 - 41 U/L   ALT 20 0 - 44 U/L   Alkaline Phosphatase 82 38 - 126 U/L   Total Bilirubin 0.7 0.3 - 1.2 mg/dL   Bilirubin, Direct <6.4 0.0 - 0.2 mg/dL   Indirect Bilirubin NOT CALCULATED 0.3 - 0.9 mg/dL    Comment: Performed at [facility], [address], [place], [place] [address]  Beta-hydroxybutyric acid     Status: Abnormal   Collection Time: 12/12/22  8:54 PM  Result Value Ref Range   Beta-Hydroxybutyric Acid 0.50 (H) 0.05 - 0.27 mmol/L    Comment: Performed at [facility], [address] [place], [place] [address]  CBG monitoring, ED     Status: Abnormal   Collection Time: 12/12/22  8:59 PM  Result Value Ref Range   Glucose-Capillary 307 (H) 70 - 99 mg/dL    Comment: Glucose  reference range applies only to samples taken after fasting for at least 8 hours.  Urinalysis, Routine w  reflex microscopic -Urine, Clean Catch     Status: Abnormal   Collection Time: 12/12/22  9:15 PM  Result Value Ref Range   Color, Urine YELLOW YELLOW   APPearance HAZY (A) CLEAR   Specific Gravity, Urine 1.029 1.005 - 1.030   pH 7.0 5.0 - 8.0   Glucose, UA >=500 (A) NEGATIVE mg/dL   Hgb urine dipstick LARGE (A) NEGATIVE   Bilirubin Urine NEGATIVE NEGATIVE   Ketones, ur 20 (A) NEGATIVE mg/dL   Protein, ur 30 (A) NEGATIVE mg/dL   Nitrite NEGATIVE NEGATIVE   Leukocytes,Ua TRACE (A) NEGATIVE   RBC / HPF >50 0 - 5 RBC/hpf   WBC, UA 11-20 0 - 5 WBC/hpf   Bacteria, UA NONE SEEN NONE SEEN   Squamous Epithelial / HPF 0-5 0 - 5 /HPF    Comment: Performed at [facility], [address], [place], [place] [address]  Rapid urine drug screen (hospital performed)     Status: Abnormal   Collection Time: 12/12/22  9:15 PM  Result Value Ref Range   Opiates NONE DETECTED NONE DETECTED   Cocaine NONE DETECTED NONE DETECTED   Benzodiazepines NONE DETECTED NONE DETECTED   Amphetamines NONE DETECTED NONE DETECTED   Tetrahydrocannabinol POSITIVE (A) NONE DETECTED   Barbiturates NONE DETECTED NONE DETECTED    Comment: (NOTE) DRUG SCREEN FOR MEDICAL PURPOSES ONLY.  IF CONFIRMATION IS NEEDED FOR ANY PURPOSE, NOTIFY LAB WITHIN 5 DAYS.  LOWEST DETECTABLE LIMITS FOR URINE DRUG SCREEN Drug Class                     Cutoff (ng/mL) Amphetamine and metabolites    1000 Barbiturate and metabolites    200 Benzodiazepine                 200 Opiates and metabolites        300 Cocaine and metabolites        300 THC                            50 Performed at [facility], [address] [place], [place] [address]   CBG monitoring, ED     Status: Abnormal   Collection Time: 12/12/22  9:56 PM  Result Value Ref Range   Glucose-Capillary 330 (H) 70 - 99 mg/dL    Comment: Glucose reference range applies only to samples taken after fasting for at least 8 hours.  Blood gas,  venous (at [facility] and [facility])     Status: Abnormal   Collection Time: 12/12/22 11:15 PM  Result Value Ref Range   pH, Ven 7.42 7.25 - 7.43   pCO2, Ven 39 (L) 44 - 60 mmHg   pO2, Ven 35 32 - 45 mmHg   Bicarbonate 25.3 20.0 - 28.0 mmol/L   Acid-Base Excess 0.8 0.0 - 2.0 mmol/L   O2 Saturation 59.1 %   Patient temperature 37.0     Comment: Performed at [facility], [address], [place], [place] [address]  Troponin I (High Sensitivity)     Status: None   Collection Time: 12/12/22 11:36 PM  Result Value Ref Range   Troponin I (High Sensitivity) <2 <18 ng/L    Comment: (NOTE) Elevated high sensitivity troponin I (hsTnI) values and significant  changes across serial measurements may suggest  ACS but many other  chronic and acute conditions are known to elevate hsTnI results.  Refer to the "Links" section for chest pain algorithms and additional  guidance. Performed at [facility], [address], [place], [place] [address]    CT ABDOMEN PELVIS W CONTRAST  Result Date: 12/13/2022 CLINICAL DATA:  Epigastric abdominal pain EXAM: CT ABDOMEN AND PELVIS WITH CONTRAST TECHNIQUE: Multidetector CT imaging of the abdomen and pelvis was performed using the standard protocol following bolus administration of intravenous contrast. RADIATION DOSE REDUCTION: This exam was performed according to the departmental dose-optimization program which includes automated exposure control, adjustment of the mA and/or kV according to patient size and/or use of iterative reconstruction technique. CONTRAST:  OMNIPAQUE IOHEXOL 300 MG/ML  SOLN COMPARISON:  None Available. FINDINGS: Lower chest: No acute abnormality.  Small hiatal hernia. Hepatobiliary: No focal liver abnormality is seen. Status post cholecystectomy. No biliary dilatation. Pancreas: Unremarkable Spleen: Unremarkable Adrenals/Urinary Tract: The adrenal glands are unremarkable. The kidneys are normal in size and position. 3  mm nonobstructing calculus noted within the interpolar region of the right kidney. The kidneys are otherwise unremarkable. Bladder unremarkable. Stomach/Bowel: Stomach is within normal limits. Appendix appears normal. No evidence of bowel wall thickening, distention, or inflammatory changes. Vascular/Lymphatic: Minimal atherosclerotic calcification within the abdominal aorta. No aortic aneurysm. No pathologic adenopathy within the abdomen and pelvis. Reproductive: Uterus and bilateral adnexa are unremarkable. Other: Moderate fat containing umbilical hernia. Musculoskeletal: No acute or significant osseous findings. IMPRESSION: 1. No acute intra-abdominal pathology identified. No definite radiographic explanation for the patient's reported symptoms. 2. Small hiatal hernia. 3. Minimal right nonobstructing nephrolithiasis. 4. Moderate fat containing umbilical hernia. Aortic Atherosclerosis (ICD10-I70.0). Electronically Signed   By: Helyn Numbers M.D.   On: 12/13/2022 00:46   DG Chest Portable 1 View  Result Date: 12/13/2022 CLINICAL DATA:  Chest pain. EXAM: PORTABLE CHEST 1 VIEW COMPARISON:  11/08/2022 FINDINGS: The cardiomediastinal contours are normal. The lungs are clear. Pulmonary vasculature is normal. No consolidation, pleural effusion, or pneumothorax. No acute osseous abnormalities are seen. IMPRESSION: No acute chest findings. Electronically Signed   By: Narda Rutherford M.D.   On: 12/13/2022 00:00    Pending Labs Unresulted Labs (From admission, onward)    None       Vitals/Pain Today's Vitals   12/13/22 0005 12/13/22 0030 12/13/22 0251 12/13/22 0252  BP:   (!) 179/88   Pulse:  92 87 88  Resp:  19 20 20   Temp: 98.6 F (37 C)     TempSrc: Axillary     SpO2:  100% 97% 100%  Weight:      Height:      PainSc:        Isolation Precautions No active isolations  Medications Medications  lactated ringers bolus 1,000 mL (0 mLs Intravenous Stopped 12/12/22 2227)  metoCLOPramide  (REGLAN) injection 10 mg (10 mg Intravenous Given 12/12/22 2051)  insulin aspart (novoLOG) injection 10 Units (10 Units Subcutaneous Given 12/12/22 2100)  LORazepam (ATIVAN) injection 0.5 mg (0.5 mg Intravenous Given 12/12/22 2100)  amLODipine (NORVASC) tablet 10 mg (10 mg Oral Given 12/12/22 2208)  lactated ringers bolus 1,000 mL (0 mLs Intravenous Stopped 12/13/22 0148)  ondansetron (ZOFRAN) injection 4 mg (4 mg Intravenous Given 12/12/22 2316)  droperidol (INAPSINE) 2.5 MG/ML injection 1.25 mg (1.25 mg Intravenous Given 12/12/22 2331)  iohexol (OMNIPAQUE) 300 MG/ML solution 100 mL (100 mLs Intravenous Contrast Given 12/12/22 2358)  prochlorperazine (COMPAZINE) injection 10 mg (10 mg Intravenous Given  12/13/22 0205)    Mobility walks with person assist

## 2022-12-13 NOTE — Inpatient Diabetes Management (Addendum)
Inpatient Diabetes Program Recommendations  AACE/ADA: New Consensus Statement on Inpatient Glycemic Control (2015)  Target Ranges:  Prepandial:   less than 140 mg/dL      Peak postprandial:   less than 180 mg/dL (1-2 hours)      Critically ill patients:  140 - 180 mg/dL   Lab Results  Component Value Date   GLUCAP 332 (H) 12/13/2022   HGBA1C 11.5 (H) 11/08/2022    Review of Glycemic Control  Latest Reference Range & Units 12/12/22 17:51 12/12/22 20:59 12/12/22 21:56 12/13/22 03:26 12/13/22 05:50 12/13/22 07:51  Glucose-Capillary 70 - 99 mg/dL 409 (H) 811 (H) 914 (H) 348 (H) 341 (H) 332 (H)   Diabetes history: DM 2 Outpatient Diabetes medications: 70/30 50 units bid Current orders for Inpatient glycemic control:   70/30 25 units bid Novolog 0-9 units tid + hs   A1c 11.5% on 5/8 during last hospitalization, Diabetes Coordinator saw pt on 5/9. Pt was d/c'd on 70/30 50 units bid and a glucometer. Pt moved to [place] recently. I see where pt called for refills on 6/7 to her PCP, Pt checked back in on 6/10 but had to come to the ED on 6/11. Pt had transitions of care RN call her on the 14th, 15th, and 16th with no answer.   Inpatient Diabetes Program Recommendations:    Note insulin regimen just started. Watch glucose trends for now.  Will need to inform pt of back up insulin at [facility] for future reference.  Addendum:  Spoke with pt at bedside regarding A1c and glucose control. Informed pt of the back up [employer] insulin in case she had difficulty in the future obtaining her insulin. Discussed glucose and A1c goals. Encouraged pt to follow up with her PCP.   Thanks,  Christena Deem RN, MSN, BC-ADM Inpatient Diabetes Coordinator Team Pager [phone] (8a-5p)

## 2022-12-13 NOTE — Discharge Instructions (Signed)
Back up insulin at [facility] [employer] Novolin 70/30 insulin at the same dose take it the same time of day as your other insulin $25 vial $43 for a box of 5 insulin pens

## 2022-12-13 NOTE — ED Notes (Signed)
Dr. Hall at bedside.

## 2022-12-13 NOTE — Progress Notes (Signed)
PROGRESS NOTE  [name]  DOB: [birth year]  PCP: Georganna Skeans, MD ZOX:[id number]  DOA: 12/12/2022  LOS: 0 days  Hospital Day: 2  Brief narrative: [name] is a 47 y.o. female with PMH significant for obesity, DM2, diabetic gastroparesis on Reglan, HTN, bipolar disorder and h/o DKA in the past Patient recently moved from [place] to [place] and is currently living in a hotel.  Last hospitalization 5/8 to 5/13 for DKA, treated per protocol and discharged on 50 units of insulin 70/30 twice daily.  Patient apparently was unable to get it from pharmacy as it required prior authorization.  So she stayed off insulin postdischarge. 6/11, patient was brought to the ED via EMS from home for persistent nausea and vomiting, also reported associated polyuria, hyperglycemia.   In the ED, patient was afebrile, heart rate in 80s, blood pressure 150/88, breathing on room air WBC count 13.2, blood sugar 386, beta-hydroxybutyrate is mildly elevated to 0.5, anion gap normal, pH normal at 7.42 Urinalysis with hazy yellow urine with large amount of hemoglobin (currently on menstrual cycle), trace leukocytes Urine drug screen positive for THC. Patient was started on IV fluid, subcutaneous insulin, IV antiemetics Admitted to [facility]  Subjective: Patient was seen and examined this morning.  Young African-American female.  Not in distress. Last vomiting was this morning.  Was about to try clear liquid diet for the first time at the time of my evaluation.  Assessment and plan: Intractable nausea and vomiting Multifactorial: Gastroparesis, cannabis hyperemesis syndrome from [facility] use PRN IV antiemetics Resume Reglan 10 mg 3 times daily Start clear liquid diet today IV fluid to continue for next 24 hours  Type 2 diabetes mellitus uncontrolled with hyperglycemia A1c 11.5 on 11/30/2022 Blood sugar persistently elevated with mild ketosis but no acidosis.  Not in DKA this time Last hospitalization 5/8 to  5/13 for DKA, treated per protocol and discharged on insulin 70/30 50 units twice daily. Patient however was not able to pick it up from [facility] because the pharmacy told her it required prior authorization and she did not follow up after that. Recent Labs  Lab 12/12/22 2156 12/13/22 0326 12/13/22 0550 12/13/22 0751 12/13/22 1229  GLUCAP 330* 348* 341* 332* 215*   Essential hypertension PTA meds-Lopressor 50 mg twice daily, lisinopril 40 mg daily, amlodipine 10 mg daily Resume all  Chronic microcytic anemia Chronic iron deficiency Probably from menstrual loss.  Does not seem to be compliant to iron supplement Recent Labs    11/11/22 0619 11/12/22 0618 11/13/22 0452 12/12/22 1823 12/13/22 0522  HGB 11.3* 11.1* 10.5* 13.1 12.0  MCV 75.2* 75.3* 75.6* 78.5* 78.3*  VITAMINB12 517  --   --   --   --   FOLATE 15.7  --   --   --   --   FERRITIN 16  --   --   --   --   TIBC 489*  --   --   --   --   IRON 46  --   --   --   --     Bipolar disorder PTA meds-Paxil 20 mg daily, hydroxyzine 25 mg 3 times daily as needed, not taking Risperdal or Cogentin Resume Paxil, hydroxyzine    THC use UDS positive for THC. Counseled to quit  Morbid Obesity  Body mass index is 37.67 kg/m. Patient has been advised to make an attempt to improve diet and exercise patterns to aid in weight loss.   Goals of care  Code Status: Full Code     DVT prophylaxis:     Antimicrobials: None Fluid: NS at 100 mill per hour Consultants: None Family Communication: None at bedside  Status: Observation Level of care:  Telemetry   Patient from: Home Anticipated d/c to: Hopefully home in 1 to 2 days Needs to continue in-hospital care:  Need to continue IV fluid, continue to monitor blood glucose level    Diet:  Diet Order             Diet full liquid Room service appropriate? Yes; Fluid consistency: Thin  Diet effective now                   Scheduled Meds:  amLODipine  10 mg Oral  Daily   insulin aspart  0-5 Units Subcutaneous QHS   insulin aspart  0-9 Units Subcutaneous TID WC   insulin aspart protamine- aspart  25 Units Subcutaneous BID WC   lisinopril  40 mg Oral Daily   metoCLOPramide  10 mg Oral TID AC   metoprolol tartrate  50 mg Oral BID    PRN meds: hydrALAZINE, prochlorperazine   Infusions:   sodium chloride 100 mL/hr at 12/13/22 1234    Antimicrobials: Anti-infectives (From admission, onward)    None       Nutritional status:  Body mass index is 37.67 kg/m.          Objective: Vitals:   12/13/22 0901 12/13/22 1351  BP: (!) 153/79 (!) 176/83  Pulse: (!) 101 84  Resp: 18 18  Temp: 99 F (37.2 C) 98.2 F (36.8 C)  SpO2: 100% 99%    Intake/Output Summary (Last 24 hours) at 12/13/2022 1553 Last data filed at 12/13/2022 1256 Gross per 24 hour  Intake 235 ml  Output 450 ml  Net -215 ml   Filed Weights   12/12/22 1756  Weight: 126 kg   Weight change:  Body mass index is 37.67 kg/m.   Physical Exam: General exam: Pleasant, young African-American female.  Not in distress Skin: No rashes, lesions or ulcers. HEENT: Atraumatic, normocephalic, no obvious bleeding Lungs: Clear to auscultation bilaterally CVS: Regular rate and rhythm, no murmur GI/Abd soft, nontender, nondistended, bowel sound present CNS: Alert, awake, oriented x 3. Psychiatry: Sad affect Extremities: No pedal edema, no calf tenderness  Data Review: I have personally reviewed the laboratory data and studies available.  F/u labs ordered Unresulted Labs (From admission, onward)    None       Total time spent in review of labs and imaging, patient evaluation, formulation of plan, documentation and communication with family: 65 minutes  Signed, Lorin Glass, MD [facility] 12/13/2022

## 2022-12-13 NOTE — Progress Notes (Addendum)
Per unit RN pump continues to alarm on newly placed USGPIV. IV site checked and remains patent. Dressing and pigtail changed. Pump continues to alarm even when not connected to pt. Unit RN replaced NS bag and tubing and changed channel for pump. Pump is now running without interruption.

## 2022-12-13 NOTE — ED Notes (Signed)
Pt assisted w bedpan

## 2022-12-13 NOTE — H&P (Addendum)
History and Physical  Jealousy Rashed ZOX:[id number] DOB: [birth year] DOA: 12/12/2022  Referring physician: Berle Mull, PA-EDP  PCP: Georganna Skeans, MD  Outpatient Specialists: Behavioral health. Patient coming from: Home.  Chief Complaint: Intractable nausea and vomiting  HPI: [name] is a 47 y.o. female with medical history significant for type 2 diabetes, gastroparesis on home Reglan 10 mg 3 times daily before meals, obesity, hypertension, bipolar disorder, who presented to [facility] ED from home, brought in via EMS, due to persistent nausea and vomiting.  Associated with increased urinary frequency and hyperglycemia.  Reportedly she has been out of her insulin for about a week.  Recently relocated to [place].  In the ED, hyperglycemic with blood sugar in the 300s.  With intractable nausea and vomiting despite IV antiemetics.  Urine positive for ketonuria, serum bicarb 20, blood glucose 329, pH 7.42 on VBG.  The patient received 1 L IV fluid bolus LR x 2, subcu insulin, and IV antiemetics.  UDS positive for THC.  TRH, hospitalist service, was asked to admit.  ED Course: Temperature 98.3.  BP 168/79, pulse 84, respiration rate 10, O2 saturation 100% on room air.  Lab studies remarkable for serum sodium 132, serum bicarb 20, glucose 329, hemoglobin 13.1, WBC 13.2, platelet count 557.  Review of Systems: Review of systems as noted in the HPI. All other systems reviewed and are negative.   Past Medical History:  Diagnosis Date   Abnormal Pap smear    Bipolar 1 disorder (HCC)    Diabetes mellitus without complication (HCC)    Fibroids    Hypertension    IBS (irritable bowel syndrome)    Past Surgical History:  Procedure Laterality Date   CERVICAL BIOPSY     CHOLECYSTECTOMY     ESSURE TUBAL LIGATION     HIATAL HERNIA REPAIR     TUBAL LIGATION      Social History:  reports that she has quit smoking. Her smoking use included cigarettes. She has never used smokeless tobacco. She  reports that she does not currently use alcohol. She reports that she does not currently use drugs.   No Known Allergies  Family History  Problem Relation Age of Onset   Diabetes Father    Diabetes Paternal Grandmother    Diabetes Maternal Grandmother    Mental illness Cousin    Healthy Mother       Prior to Admission medications   Medication Sig Start Date End Date Taking? Authorizing Provider  amLODipine (NORVASC) 10 MG tablet Take 1 tablet (10 mg total) by mouth daily. 12/08/22  Yes Georganna Skeans, MD  benztropine (COGENTIN) 1 MG tablet Take 1 mg by mouth 2 (two) times daily.   Yes [provider]  hydrOXYzine (ATARAX) 25 MG tablet Take 1 tablet (25 mg total) by mouth 3 (three) times daily as needed for anxiety. For anxiety 06/01/21  Yes Nkwenti, Tyler Aas, NP  insulin isophane & regular human KwikPen (NOVOLIN 70/30 KWIKPEN) (70-30) 100 UNIT/ML KwikPen Inject 50 Units into the skin 2 (two) times daily with a meal. 12/12/22 01/11/23 Yes Groce, Zoe Lan, PA-C  lisinopril (ZESTRIL) 40 MG tablet Take 1 tablet (40 mg total) by mouth daily. 12/08/22  Yes Georganna Skeans, MD  metoCLOPramide (REGLAN) 10 MG tablet Take 1 tablet (10 mg total) by mouth 3 (three) times daily before meals. 11/13/22 12/13/22 Yes Amin, Loura Halt, MD  metoprolol tartrate (LOPRESSOR) 50 MG tablet Take 1 tablet (50 mg total) by mouth 2 (two) times daily. 12/08/22  Yes  Georganna Skeans, MD  PARoxetine (PAXIL) 20 MG tablet Take 20 mg by mouth daily.   Yes [provider]  atorvastatin (LIPITOR) 80 MG tablet Take 1 tablet (80 mg total) by mouth daily. Elevated cholesterol 06/01/21 11/08/22  Starleen Blue, NP  benztropine (COGENTIN) 0.5 MG tablet Take 1 tablet (0.5 mg total) by mouth 2 (two) times daily. tremors Patient not taking: Reported on 12/13/2022 06/01/21   Starleen Blue, NP  Blood Glucose Monitoring Suppl (BLOOD GLUCOSE MONITOR SYSTEM) w/Device KIT Use 3 (three) times daily. 11/13/22   Amin, Loura Halt, MD   docusate sodium (COLACE) 100 MG capsule Take 1 capsule (100 mg total) by mouth 2 (two) times daily. Patient not taking: Reported on 12/13/2022 11/13/22   Dimple Nanas, MD  ferrous sulfate 325 (65 FE) MG tablet Take 1 tablet (325 mg total) by mouth daily with breakfast. Patient not taking: Reported on 12/13/2022 11/14/22   Dimple Nanas, MD  Glucose Blood (BLOOD GLUCOSE TEST STRIPS) STRP Use as directed to check blood sugar three times daily 11/13/22   Amin, Loura Halt, MD  Insulin Pen Needle (PEN NEEDLES) 31G X 5 MM MISC Use with insulin 3 (three) times daily. 11/13/22   Amin, Loura Halt, MD  Lancet Device MISC 1 each by Does not apply route 3 (three) times daily. May dispense any manufacturer covered by patient's insurance. 11/13/22   Amin, Loura Halt, MD  Lancets MISC Use as directed to check blood sugar 3 times daily. 11/13/22   Amin, Loura Halt, MD  risperiDONE (RISPERDAL M-TABS) 3 MG disintegrating tablet Take 1 tablet (3 mg total) by mouth daily. Mood disorder. Patient not taking: Reported on 12/13/2022 06/02/21   Starleen Blue, NP  risperiDONE (RISPERDAL M-TABS) 4 MG disintegrating tablet Take 1 tablet (4 mg total) by mouth at bedtime. For mood disorder Patient not taking: Reported on 12/13/2022 06/01/21   Starleen Blue, NP  senna-docusate (SENOKOT-S) 8.6-50 MG tablet Take 2 tablets by mouth 2 (two) times daily as needed for moderate constipation. Patient not taking: Reported on 12/13/2022 11/13/22   Dimple Nanas, MD  traZODone (DESYREL) 50 MG tablet Take 1 tablet (50 mg total) by mouth at bedtime as needed for sleep. For sleep Patient not taking: Reported on 12/13/2022 06/01/21   Starleen Blue, NP    Physical Exam: BP (!) 183/89   Pulse 81   Temp 98.3 F (36.8 C) (Oral)   Resp 19   Ht 6' (1.829 m)   Wt 126 kg   LMP  (LMP Unknown)   SpO2 100%   BMI 37.67 kg/m   General: 47 y.o. year-old female well developed well nourished in no acute distress.  Alert and  oriented x3. Cardiovascular: Regular rate and rhythm with no rubs or gallops.  No thyromegaly or JVD noted.  No lower extremity edema. 2/4 pulses in all 4 extremities. Respiratory: Clear to auscultation with no wheezes or rales. Good inspiratory effort. Abdomen: Soft nontender nondistended with normal bowel sounds x4 quadrants. Muskuloskeletal: No cyanosis, clubbing or edema noted bilaterally Neuro: CN II-XII intact, strength, sensation, reflexes Skin: No ulcerative lesions noted or rashes Psychiatry: Judgement and insight appear normal. Mood is appropriate for condition and setting          Labs on Admission:  Basic Metabolic Panel: Recent Labs  Lab 12/12/22 1823  NA 132*  K 4.4  CL 100  CO2 20*  GLUCOSE 329*  BUN 10  CREATININE 0.60  CALCIUM 9.7   Liver Function Tests:  Recent Labs  Lab 12/12/22 2054  AST 15  ALT 20  ALKPHOS 82  BILITOT 0.7  PROT 8.6*  ALBUMIN 3.8   No results for input(s): "LIPASE", "AMYLASE" in the last 168 hours. No results for input(s): "AMMONIA" in the last 168 hours. CBC: Recent Labs  Lab 12/12/22 1823  WBC 13.2*  HGB 13.1  HCT 42.3  MCV 78.5*  PLT 557*   Cardiac Enzymes: No results for input(s): "CKTOTAL", "CKMB", "CKMBINDEX", "TROPONINI" in the last 168 hours.  BNP (last 3 results) No results for input(s): "BNP" in the last 8760 hours.  ProBNP (last 3 results) No results for input(s): "PROBNP" in the last 8760 hours.  CBG: Recent Labs  Lab 12/12/22 1751 12/12/22 2059 12/12/22 2156 12/13/22 0326  GLUCAP 386* 307* 330* 348*    Radiological Exams on Admission: CT ABDOMEN PELVIS W CONTRAST  Result Date: 12/13/2022 CLINICAL DATA:  Epigastric abdominal pain EXAM: CT ABDOMEN AND PELVIS WITH CONTRAST TECHNIQUE: Multidetector CT imaging of the abdomen and pelvis was performed using the standard protocol following bolus administration of intravenous contrast. RADIATION DOSE REDUCTION: This exam was performed according to the  departmental dose-optimization program which includes automated exposure control, adjustment of the mA and/or kV according to patient size and/or use of iterative reconstruction technique. CONTRAST:  OMNIPAQUE IOHEXOL 300 MG/ML  SOLN COMPARISON:  None Available. FINDINGS: Lower chest: No acute abnormality.  Small hiatal hernia. Hepatobiliary: No focal liver abnormality is seen. Status post cholecystectomy. No biliary dilatation. Pancreas: Unremarkable Spleen: Unremarkable Adrenals/Urinary Tract: The adrenal glands are unremarkable. The kidneys are normal in size and position. 3 mm nonobstructing calculus noted within the interpolar region of the right kidney. The kidneys are otherwise unremarkable. Bladder unremarkable. Stomach/Bowel: Stomach is within normal limits. Appendix appears normal. No evidence of bowel wall thickening, distention, or inflammatory changes. Vascular/Lymphatic: Minimal atherosclerotic calcification within the abdominal aorta. No aortic aneurysm. No pathologic adenopathy within the abdomen and pelvis. Reproductive: Uterus and bilateral adnexa are unremarkable. Other: Moderate fat containing umbilical hernia. Musculoskeletal: No acute or significant osseous findings. IMPRESSION: 1. No acute intra-abdominal pathology identified. No definite radiographic explanation for the patient's reported symptoms. 2. Small hiatal hernia. 3. Minimal right nonobstructing nephrolithiasis. 4. Moderate fat containing umbilical hernia. Aortic Atherosclerosis (ICD10-I70.0). Electronically Signed   By: Helyn Numbers M.D.   On: 12/13/2022 00:46   DG Chest Portable 1 View  Result Date: 12/13/2022 CLINICAL DATA:  Chest pain. EXAM: PORTABLE CHEST 1 VIEW COMPARISON:  11/08/2022 FINDINGS: The cardiomediastinal contours are normal. The lungs are clear. Pulmonary vasculature is normal. No consolidation, pleural effusion, or pneumothorax. No acute osseous abnormalities are seen. IMPRESSION: No acute chest  findings. Electronically Signed   By: Narda Rutherford M.D.   On: 12/13/2022 00:00    EKG: I independently viewed the EKG done and my findings are as followed: Sinus rhythm rate of 94.  Nonspecific ST-T changes.  QTc 463.  Assessment/Plan Present on Admission:  Intractable nausea and vomiting  Principal Problem:   Intractable nausea and vomiting  Intractable nausea and vomiting, suspect multifactorial secondary to gastroparesis, possible secondary to cannabis hyperemesis syndrome from [facility] use Supportive care IV antiemetics as needed IV fluid hydration  Non anion gap metabolic acidosis secondary to GI losses Metabolic acidosis is unlikely to be DKA at this time with pH greater than 7.3 and serum bicarb greater than 18. Serum bicarb 20 with anion gap of 12 Continue IV fluid hydration Repeat BMP  Type 2 diabetes with hyperglycemia Obtain hemoglobin  A1c Continue IV fluid hydration Start insulin sliding scale every 4 hours while NPO. Start with clear liquid diet and advance as tolerated, once nausea has improved.  Bipolar disorder Resume home regimen when able to tolerate oral intake.  Hypertension BP is not at goal, elevated IV hydralazine as needed with parameters Closely monitor vital signs Resume home oral antihypertensives once able to tolerate oral intake  THC use Denies any recent use, stated her last use was 2 to 3 weeks ago UDS positive for THC.  Obesity BMI 37 Recommend weight loss outpatient with regular physical activity and healthy dieting.      DVT prophylaxis: Subcu Lovenox daily  Code Status: Full code.  Family Communication: None at bedside.  Disposition Plan: Admitted to telemetry unit  Consults called: None.  Admission status: Observation status.   Status is: Observation    Darlin Drop MD [facility] Pager [phone]  If 7PM-7AM, please contact night-coverage [email or web address] Password [facility]  12/13/2022, 5:16 AM

## 2022-12-14 ENCOUNTER — Other Ambulatory Visit (HOSPITAL_COMMUNITY): Payer: Self-pay

## 2022-12-14 DIAGNOSIS — R112 Nausea with vomiting, unspecified: Secondary | ICD-10-CM | POA: Diagnosis not present

## 2022-12-14 LAB — CBC WITH DIFFERENTIAL/PLATELET
Abs Immature Granulocytes: 0.05 10*3/uL (ref 0.00–0.07)
Basophils Absolute: 0.1 10*3/uL (ref 0.0–0.1)
Basophils Relative: 0 %
Eosinophils Absolute: 0 10*3/uL (ref 0.0–0.5)
Eosinophils Relative: 0 %
HCT: 37.5 % (ref 36.0–46.0)
Hemoglobin: 11.8 g/dL — ABNORMAL LOW (ref 12.0–15.0)
Immature Granulocytes: 0 %
Lymphocytes Relative: 19 %
Lymphs Abs: 2.4 10*3/uL (ref 0.7–4.0)
MCH: 24.9 pg — ABNORMAL LOW (ref 26.0–34.0)
MCHC: 31.5 g/dL (ref 30.0–36.0)
MCV: 79.3 fL — ABNORMAL LOW (ref 80.0–100.0)
Monocytes Absolute: 1 10*3/uL (ref 0.1–1.0)
Monocytes Relative: 8 %
Neutro Abs: 9.4 10*3/uL — ABNORMAL HIGH (ref 1.7–7.7)
Neutrophils Relative %: 73 %
Platelets: 425 10*3/uL — ABNORMAL HIGH (ref 150–400)
RBC: 4.73 MIL/uL (ref 3.87–5.11)
RDW: 19.1 % — ABNORMAL HIGH (ref 11.5–15.5)
WBC: 12.9 10*3/uL — ABNORMAL HIGH (ref 4.0–10.5)
nRBC: 0 % (ref 0.0–0.2)

## 2022-12-14 LAB — GLUCOSE, CAPILLARY
Glucose-Capillary: 160 mg/dL — ABNORMAL HIGH (ref 70–99)
Glucose-Capillary: 186 mg/dL — ABNORMAL HIGH (ref 70–99)
Glucose-Capillary: 193 mg/dL — ABNORMAL HIGH (ref 70–99)
Glucose-Capillary: 211 mg/dL — ABNORMAL HIGH (ref 70–99)

## 2022-12-14 LAB — BASIC METABOLIC PANEL
Anion gap: 12 (ref 5–15)
BUN: 13 mg/dL (ref 6–20)
CO2: 23 mmol/L (ref 22–32)
Calcium: 8.9 mg/dL (ref 8.9–10.3)
Chloride: 99 mmol/L (ref 98–111)
Creatinine, Ser: 0.58 mg/dL (ref 0.44–1.00)
GFR, Estimated: >60 mL/min (ref >60)
Glucose, Bld: 198 mg/dL — ABNORMAL HIGH (ref 70–99)
Potassium: 3.3 mmol/L — ABNORMAL LOW (ref 3.5–5.1)
Sodium: 134 mmol/L — ABNORMAL LOW (ref 135–145)

## 2022-12-14 LAB — PHOSPHORUS: Phosphorus: 3.8 mg/dL (ref 2.5–4.6)

## 2022-12-14 LAB — MAGNESIUM: Magnesium: 2 mg/dL (ref 1.7–2.4)

## 2022-12-14 MED ORDER — BENZTROPINE MESYLATE 1 MG PO TABS
1.0000 mg | ORAL_TABLET | Freq: Two times a day (BID) | ORAL | 0 refills | Status: DC
  Filled 2022-12-14: qty 14, 7d supply, fill #0

## 2022-12-14 MED ORDER — TRAZODONE HCL 50 MG PO TABS: 50.0000 mg | ORAL_TABLET | Freq: Every evening | ORAL | Status: DC | PRN

## 2022-12-14 MED ORDER — PROCHLORPERAZINE EDISYLATE 10 MG/2ML IJ SOLN: 5.0000 mg | Freq: Four times a day (QID) | INTRAMUSCULAR | Status: DC | PRN

## 2022-12-14 MED ORDER — HYDROXYZINE HCL 25 MG PO TABS: 25.0000 mg | ORAL_TABLET | Freq: Three times a day (TID) | ORAL | Status: DC | PRN

## 2022-12-14 MED ORDER — ATORVASTATIN CALCIUM 80 MG PO TABS
80.0000 mg | ORAL_TABLET | Freq: Every day | ORAL | 0 refills | Status: DC
  Filled 2022-12-14: qty 30, 30d supply, fill #0

## 2022-12-14 MED ORDER — PAROXETINE HCL 20 MG PO TABS
20.0000 mg | ORAL_TABLET | Freq: Every day | ORAL | Status: DC
  Administered 2022-12-14: 20 mg via ORAL
  Filled 2022-12-14: qty 1

## 2022-12-14 MED ORDER — NOVOLIN 70/30 FLEXPEN RELION (70-30) 100 UNIT/ML ~~LOC~~ SUPN
50.0000 [IU] | PEN_INJECTOR | Freq: Two times a day (BID) | SUBCUTANEOUS | 0 refills | Status: DC
  Filled 2022-12-14 (×2): qty 90, 90d supply, fill #0

## 2022-12-14 MED ORDER — INSULIN ASPART PROT & ASPART (70-30 MIX) 100 UNIT/ML ~~LOC~~ SUSP
35.0000 [IU] | Freq: Two times a day (BID) | SUBCUTANEOUS | Status: DC
  Administered 2022-12-14: 35 [IU] via SUBCUTANEOUS
  Filled 2022-12-14 (×2): qty 10

## 2022-12-14 MED ORDER — HALOPERIDOL 0.5 MG PO TABS
0.5000 mg | ORAL_TABLET | Freq: Two times a day (BID) | ORAL | 0 refills | Status: DC | PRN
  Filled 2022-12-14: qty 14, 7d supply, fill #0

## 2022-12-14 MED ORDER — METOPROLOL TARTRATE 50 MG PO TABS
50.0000 mg | ORAL_TABLET | Freq: Two times a day (BID) | ORAL | 0 refills | Status: DC
  Filled 2022-12-14: qty 60, 30d supply, fill #0

## 2022-12-14 MED ORDER — PAROXETINE HCL 20 MG PO TABS
20.0000 mg | ORAL_TABLET | Freq: Every day | ORAL | 0 refills | Status: DC
  Filled 2022-12-14: qty 7, 7d supply, fill #0

## 2022-12-14 MED ORDER — AMLODIPINE BESYLATE 10 MG PO TABS
10.0000 mg | ORAL_TABLET | Freq: Every day | ORAL | 0 refills | Status: DC
  Filled 2022-12-14: qty 30, 30d supply, fill #0

## 2022-12-14 MED ORDER — BENZTROPINE MESYLATE 0.5 MG PO TABS
1.0000 mg | ORAL_TABLET | Freq: Two times a day (BID) | ORAL | Status: DC
  Administered 2022-12-14: 1 mg via ORAL
  Filled 2022-12-14: qty 2

## 2022-12-14 MED ORDER — METOCLOPRAMIDE HCL 10 MG PO TABS
10.0000 mg | ORAL_TABLET | Freq: Three times a day (TID) | ORAL | 0 refills | Status: DC | PRN
  Filled 2022-12-14: qty 21, 7d supply, fill #0

## 2022-12-14 NOTE — Progress Notes (Signed)
Patient ID: [name], female    DOB: 01/06/1976  MRN: [id number]  CC: Hospital Discharge Follow-Up  Subjective: [name] is a 47 y.o. female who presents for hospital discharge follow-up.  Her concerns today include:  [name] 12/12/2022 - 12/14/2022 per MD note: Admit date: 12/12/2022 Discharge date: 12/14/2022   Admitted From: Home  Discharge disposition: Home   Recommendations at discharge:  Ensure compliance with insulin, blood glucose monitoring Prescriptions for psych meds sent for limited supply. Follow-up with your mental health provider for further assessment and refills.   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  Symptoms improved.  Able to tolerate full liquid diet.  Feels ready for discharge today. I would recommend to continue Reglan as needed at home.  Stay on full liquid diet for next 2 days, then advance to soft and regular diet ultimately.   Type 2 diabetes mellitus uncontrolled with hyperglycemia A1c 11.5 on 11/30/2022 Blood sugar persistently elevated with mild ketosis but no acidosis. Not in DKA this time Resumed on insulin 70/30.  Continue 50 units twice daily at home as before.  Essential hypertension PTA meds-Lopressor 50 mg twice daily, lisinopril 40 mg daily, amlodipine 10 mg daily Continue all   Chronic microcytic anemia Chronic iron deficiency Probably from menstrual loss.  Does not seem to be compliant to iron supplement  Bipolar disorder PTA meds-Paxil 20 mg daily, hydroxyzine 25 mg 3 times daily as needed, Haldol, Cogentin Patient states she missed her appointment with psychiatrist on 6/12 because of hospitalization. She asked me for prescription of these before she could get an appointment again.  I wrote for 7-day supply.    THC use UDS positive for THC. Counseled to quit   Morbid  Obesity  Body mass index is 37.67 kg/m. Patient has been advised to make an attempt to improve diet and exercise patterns to aid in weight loss.  Today's visit 12/15/2022: Reports nausea and vomiting improved since hospital discharge. She is taking insulin as prescribed. She is checking blood sugars outside of office. Most recent blood sugars 278 and 207. She denies red flag symptoms associated with diabetes. She is taking blood pressure medications as prescribed. She denies red flag symptoms associated with high blood pressure. Reports she is not taking iron supplement as prescribed because it causes constipation. Reports she is planning to call Psychiatry to schedule a follow-up appointment when she is able to do so. Reports she is not using THC or any illicit substance currently. Reports the last time she did use was 2 weeks ago. No further issues/concerns for discussion today.     Patient Active Problem List   Diagnosis Date Noted   Intractable nausea and vomiting 12/13/2022   DKA (diabetic ketoacidosis) (HCC) 11/09/2022   DKA, type 2 (HCC) 11/08/2022   Postural dizziness with presyncope 11/08/2022   Hyperlipidemia 11/08/2022   Psychosis (HCC) 01/31/2022   MDD (major depressive disorder), recurrent, severe, 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/16/2021   Candida vaginitis 04/11/2021   Essential hypertension 04/08/2019   Class 2 severe obesity due to excess calories with serious comorbidity and body mass index (BMI) of 39.0 to 39.9 in adult (HCC) 04/08/2019   Nicotine abuse 04/08/2019   Bipolar disorder (HCC) 05/18/2018   Diabetes mellitus (HCC) 08/10/2016   Bipolar disorder, curr episode mixed, severe, with psychotic features (HCC) 08/07/2016   Cannabis use disorder,  moderate, dependence (HCC) 08/07/2016   CHOLECYSTITIS, UNSPECIFIED 06/12/2008   BILIARY COLIC 06/10/2008   CERUMEN IMPACTION, BILATERAL 01/17/2008   PHARYNGITIS, VIRAL 01/17/2008   NECK PAIN 01/17/2008     Current Outpatient  Medications on File Prior to Visit  Medication Sig Dispense Refill   amLODipine (NORVASC) 10 MG tablet Take 1 tablet (10 mg total) by mouth daily. 30 tablet 0   atorvastatin (LIPITOR) 80 MG tablet Take 1 tablet (80 mg total) by mouth daily. Elevated cholesterol 30 tablet 0   benztropine (COGENTIN) 1 MG tablet Take 1 tablet (1 mg total) by mouth 2 (two) times daily for 7 days. 14 tablet 0   Blood Glucose Monitoring Suppl (BLOOD GLUCOSE MONITOR SYSTEM) w/Device KIT Use 3 (three) times daily. 1 kit 0   Glucose Blood (BLOOD GLUCOSE TEST STRIPS) STRP Use as directed to check blood sugar three times daily 100 strip 0   haloperidol (HALDOL) 0.5 MG tablet Take 1 tablet (0.5 mg total) by mouth 2 (two) times daily as needed for up to 7 days for agitation. 14 tablet 0   hydrOXYzine (ATARAX) 25 MG tablet Take 1 tablet (25 mg total) by mouth 3 (three) times daily as needed for anxiety. For anxiety 30 tablet 0   insulin isophane & regular human KwikPen (NOVOLIN 70/30 KWIKPEN) (70-30) 100 UNIT/ML KwikPen Inject 50 Units into the skin 2 (two) times daily with a meal. 90 mL 0   Insulin Pen Needle (PEN NEEDLES) 31G X 5 MM MISC Use with insulin 3 (three) times daily. 100 each 0   Lancet Device MISC 1 each by Does not apply route 3 (three) times daily. May dispense any manufacturer covered by patient's insurance. 1 each 0   Lancets MISC Use as directed to check blood sugar 3 times daily. 100 each 0   lisinopril (ZESTRIL) 40 MG tablet Take 1 tablet (40 mg total) by mouth daily. 30 tablet 0   metoCLOPramide (REGLAN) 10 MG tablet Take 1 tablet (10 mg total) by mouth every 8 (eight) hours as needed for up to 7 days for nausea. 21 tablet 0   metoprolol tartrate (LOPRESSOR) 50 MG tablet Take 1 tablet (50 mg total) by mouth 2 (two) times daily. 60 tablet 0   PARoxetine (PAXIL) 20 MG tablet Take 1 tablet (20 mg total) by mouth daily for 7 days. 7 tablet 0   traZODone (DESYREL) 50 MG tablet Take 1 tablet (50 mg total) by mouth  at bedtime as needed for sleep. For sleep 30 tablet 0   docusate sodium (COLACE) 100 MG capsule Take 1 capsule (100 mg total) by mouth 2 (two) times daily. (Patient not taking: Reported on 12/13/2022) 10 capsule 0   ferrous sulfate 325 (65 FE) MG tablet Take 1 tablet (325 mg total) by mouth daily with breakfast. (Patient not taking: Reported on 12/13/2022) 30 tablet 0   senna-docusate (SENOKOT-S) 8.6-50 MG tablet Take 2 tablets by mouth 2 (two) times daily as needed for moderate constipation. (Patient not taking: Reported on 12/13/2022) 60 tablet 0   No current facility-administered medications on file prior to visit.    No Known Allergies  Social History   Socioeconomic History   Marital status: Single    Spouse name: Not on file   Number of children: 2   Years of education: Not on file   Highest education level: Master's degree (e.g., MA, MS, MEng, MEd, MSW, MBA)  Occupational History   Not on file  Tobacco Use   Smoking  status: Former    Types: Cigarettes   Smokeless tobacco: Never  Vaping Use   Vaping Use: Some days   Substances: Nicotine, Flavoring  Substance and Sexual Activity   Alcohol use: Not Currently    Comment: occasional   Drug use: Not Currently   Sexual activity: Yes    Birth control/protection: Surgical  Other Topics Concern   Not on file  Social History Narrative   ** Merged History Encounter **       Social Determinants of Health   Financial Resource Strain: Not on file  Food Insecurity: No Food Insecurity (12/13/2022)   Hunger Vital Sign    Worried About Running Out of Food in the Last Year: Never true    Ran Out of Food in the Last Year: Never true  Transportation Needs: Unmet Transportation Needs (12/15/2022)   PRAPARE - Transportation    Lack of Transportation (Medical): Yes    Lack of Transportation (Non-Medical): Yes  Physical Activity: Sufficiently Active (12/15/2022)   Exercise Vital Sign    Days of Exercise per Week: 4 days    Minutes of  Exercise per Session: 140 min  Stress: Stress Concern Present (12/15/2022)   [employer] of Occupational Health - Occupational Stress Questionnaire    Feeling of Stress : Very much  Social Connections: Unknown (12/15/2022)   Social Connection and Isolation Panel [NHANES]    Frequency of Communication with Friends and Family: Not on file    Frequency of Social Gatherings with Friends and Family: Not on file    Attends Religious Services: Not on file    Active Member of Clubs or Organizations: Yes    Attends Banker Meetings: Not on file    Marital Status: Not on file  Intimate Partner Violence: Not At Risk (12/13/2022)   Humiliation, Afraid, Rape, and Kick questionnaire    Fear of Current or Ex-Partner: No    Emotionally Abused: No    Physically Abused: No    Sexually Abused: No    Family History  Problem Relation Age of Onset   Diabetes Father    Diabetes Paternal Grandmother    Diabetes Maternal Grandmother    Mental illness Cousin    Healthy Mother     Past Surgical History:  Procedure Laterality Date   CERVICAL BIOPSY     CHOLECYSTECTOMY     ESSURE TUBAL LIGATION     HIATAL HERNIA REPAIR     TUBAL LIGATION      ROS: Review of Systems Negative except as stated above  PHYSICAL EXAM: BP (!) 149/83   Pulse 67   Temp 98.5 F (36.9 C) (Oral)   Resp 15   Ht 6' (1.829 m)   Wt 267 lb 3.2 oz (121.2 kg)   SpO2 97%   BMI 36.24 kg/m   Physical Exam HENT:     Head: Normocephalic and atraumatic.  Eyes:     Extraocular Movements: Extraocular movements intact.     Conjunctiva/sclera: Conjunctivae normal.     Pupils: Pupils are equal, round, and reactive to light.  Cardiovascular:     Rate and Rhythm: Normal rate and regular rhythm.     Pulses: Normal pulses.     Heart sounds: Normal heart sounds.  Pulmonary:     Effort: Pulmonary effort is normal.     Breath sounds: Normal breath sounds.  Musculoskeletal:     Cervical back: Normal range of motion  and neck supple.  Neurological:     General: No  focal deficit present.     Mental Status: She is alert and oriented to person, place, and time.  Psychiatric:        Mood and Affect: Mood normal.        Behavior: Behavior normal.      ASSESSMENT AND PLAN: 1. Hospital discharge follow-up - Reviewed hospital course, current medications, ensured proper follow-up in place, and addressed concerns.   2. Intractable nausea and vomiting - Improved. - Continue Reglan as prescribed. No refills needed as of present.  - Follow-up with primary provider as scheduled.  3. Uncontrolled type 2 diabetes mellitus with hyperglycemia (HCC) - Hemoglobin A1c 11.5% on 11/30/2022. - Continue Insulin 70/30 as prescribed. No refills needed as of present.  - Discussed the importance of healthy eating habits, low-carbohydrate diet, low-sugar diet, regular aerobic exercise (at least 150 minutes a week as tolerated) and medication compliance to achieve or maintain control of diabetes. - Follow-up with primary provider in 1 week or sooner if needed.   4. Essential hypertension - Continue Lopressor, Lisinopril, and Amlodipine as prescribed. No refills needed as of present.  - Counseled on blood pressure goal of less than 130/80, low-sodium, DASH diet, medication compliance, and 150 minutes of moderate intensity exercise per week as tolerated. Counseled on medication adherence and adverse effects. - Follow-up with primary provider in 1 week or sooner if needed.   5. Microcytic anemia 6. Iron deficiency anemia, unspecified iron deficiency anemia type - Continue Ferrous Sulfate as prescribed. No refills needed as of present.  - Follow-up with primary provider as scheduled.   7. Bipolar affective disorder, remission status unspecified (HCC) - Patient denies thoughts of self-harm, suicidal ideations, homicidal ideations. - Continue Paxil, Hydroxyzine, Haldol, and Cogentin as prescribed. No refills needed as of present.   - Keep all scheduled appointments with Psychiatry. - Follow-up with primary provider as scheduled.   8. Tetrahydrocannabinol (THC) use disorder, mild, abuse - Patient reports she is not currently using THC.   Patient was given the opportunity to ask questions.  Patient verbalized understanding of the plan and was able to repeat key elements of the plan. Patient was given clear instructions to go to Emergency Department or return to medical center if symptoms don't improve, worsen, or new problems develop.The patient verbalized understanding.    Return in about 1 week (around 12/22/2022) for Follow-Up or next available Georganna Skeans, MD .  Rema Fendt, NP

## 2022-12-14 NOTE — TOC Transition Note (Signed)
Transition of Care [facility]) - CM/SW Discharge Note   Patient Details  Name: [name] MRN: [id number] Date of Birth: 07-22-1975  [facility] of [facility]) CM/SW Contact:  Beckie Busing, RN Phone [phone]  12/14/2022, 12:15 PM   Clinical Narrative:    Patient with discharge orders. No TOC needs noted. TOC will sign off.    Final next level of care: Home/Self Care Barriers to Discharge: No Barriers Identified   Patient Goals and CMS Choice CMS Medicare.gov Compare Post Acute Care list provided to::  (n/a) Choice offered to / list presented to : NA  Discharge Placement                         Discharge Plan and Services Additional resources added to the After Visit Summary for                  DME Arranged: N/A DME Agency: NA       HH Arranged: RN          Social Determinants of Health (SDOH) Interventions SDOH Screenings   Food Insecurity: No Food Insecurity (12/13/2022)  Housing: High Risk (12/13/2022)  Transportation Needs: No Transportation Needs (12/13/2022)  Utilities: Patient Unable To Answer (12/13/2022)  Alcohol Screen: Low Risk  (05/16/2021)  Depression (PHQ2-9): Medium Risk (07/20/2021)  Tobacco Use: Medium Risk (12/12/2022)     Readmission Risk Interventions     No data to display

## 2022-12-14 NOTE — Progress Notes (Signed)
PT AVS reviewed and Pt verbalized understanding of DC teaching and instructions. Pt went to DC lounge where her Daughter will be picking her up. Pt IV removed.

## 2022-12-14 NOTE — Discharge Summary (Signed)
Physician Discharge Summary  [name] BJY:[id number] DOB: 1976/05/18 DOA: 12/12/2022  PCP: Georganna Skeans, MD  Admit date: 12/12/2022 Discharge date: 12/14/2022  Admitted From: Home  Discharge disposition: Home  Recommendations at discharge:  Ensure compliance with insulin, blood glucose monitoring Prescriptions for psych meds sent for limited supply. Follow-up with your mental health provider for further assessment and refills.    Brief narrative: [name] is a 47 y.o. female with PMH significant for obesity, DM2, diabetic gastroparesis on Reglan, HTN, bipolar disorder and h/o DKA in the past Patient recently moved from [place] to [place] and is currently living in a hotel.  Last hospitalization 5/8 to 5/13 for DKA, treated per protocol and discharged on 50 units of insulin 70/30 twice daily.  Patient apparently was unable to get a refill for it after a month as it required prior [place].  So she was off insulin for about a week. 6/11, patient was brought to the ED via EMS from home for persistent nausea and vomiting, also reported associated polyuria, hyperglycemia.   In the ED, patient was afebrile, heart rate in 80s, blood pressure 150/88, breathing on room air WBC count 13.2, blood sugar 386, beta-hydroxybutyrate is mildly elevated to 0.5, anion gap normal, pH normal at 7.42 Urinalysis with hazy yellow urine with large amount of hemoglobin (currently on menstrual cycle), trace leukocytes Urine drug screen positive for THC. Patient was started on IV fluid, subcutaneous insulin, IV antiemetics Admitted to [facility]  Subjective: Patient was seen and examined this morning.   Sitting up in bed.  Not in distress.  Some nausea remains.  Able to tolerate liquid diet.   She is particular concerned about her psych meds.  She ran out of them recently and Mr. Psych appointment on 6/12.  Requesting refill.    Hospital course: Intractable nausea and vomiting Multifactorial:  Gastroparesis, cannabis hyperemesis syndrome from [facility] use She was started on Reglan 10 mg 3 times daily as well as IV antiemetics.  Symptoms improved.  Able to tolerate full liquid diet.  Feels ready for discharge today. I would recommend to continue Reglan as needed at home.  Stay on full liquid diet for next 2 days, then advance to soft and regular diet ultimately.  Type 2 diabetes mellitus uncontrolled with hyperglycemia A1c 11.5 on 11/30/2022 Blood sugar persistently elevated with mild ketosis but no acidosis. Not in DKA this time Resumed on insulin 70/30.  Continue 50 units twice daily at home as before. Recent Labs  Lab 12/13/22 1702 12/13/22 2103 12/14/22 0014 12/14/22 0506 12/14/22 0716  GLUCAP 187* 166* 186* 160* 211*   Essential hypertension PTA meds-Lopressor 50 mg twice daily, lisinopril 40 mg daily, amlodipine 10 mg daily Continue all  Chronic microcytic anemia Chronic iron deficiency Probably from menstrual loss.  Does not seem to be compliant to iron supplement Recent Labs    11/11/22 0619 11/12/22 0618 11/13/22 0452 12/12/22 1823 12/13/22 0522 12/14/22 0606  HGB 11.3* 11.1* 10.5* 13.1 12.0 11.8*  MCV 75.2* 75.3* 75.6* 78.5* 78.3* 79.3*  VITAMINB12 517  --   --   --   --   --   FOLATE 15.7  --   --   --   --   --   FERRITIN 16  --   --   --   --   --   TIBC 489*  --   --   --   --   --   IRON 46  --   --   --   --   --  Bipolar disorder PTA meds-Paxil 20 mg daily, hydroxyzine 25 mg 3 times daily as needed, Haldol, Cogentin Patient states she missed her appointment with psychiatrist on 6/12 because of hospitalization. She asked me for prescription of these before she could get an appointment again.  I wrote for 7-day supply.    THC use UDS positive for THC. Counseled to quit  Morbid Obesity  Body mass index is 37.67 kg/m. Patient has been advised to make an attempt to improve diet and exercise patterns to aid in weight loss.   Goals of care   Code  Status: Full Code   Wounds:  -    Discharge Exam:   Vitals:   12/13/22 1739 12/13/22 2101 12/14/22 0509 12/14/22 1103  BP: (!) 156/75 (!) 171/90 (!) 148/73 (!) 148/92  Pulse: 90 91 99 86  Resp: 15 18 18 20   Temp: 98.2 F (36.8 C) 98.7 F (37.1 C) 98.3 F (36.8 C)   TempSrc: Oral Oral Oral   SpO2: 100% 100% 95% 97%  Weight:      Height:        Body mass index is 37.67 kg/m.  General exam: Pleasant, young African-American female. Not in distress Skin: No rashes, lesions or ulcers. HEENT: Atraumatic, normocephalic, no obvious bleeding Lungs: Clear to auscultation bilaterally CVS: Regular rate and rhythm, no murmur GI/Abd soft, nontender, nondistended, bowel sound present CNS: Alert, awake, oriented x 3. Psychiatry: Sad affect Extremities: No pedal edema, no calf tenderness  Follow ups:    Follow-up Information     Georganna Skeans, MD Follow up.   Specialty: Family Medicine Contact information: [address] [place] [phone]                 Discharge Instructions:   Discharge Instructions     Call MD for:  difficulty breathing, headache or visual disturbances   Complete by: As directed    Call MD for:  extreme fatigue   Complete by: As directed    Call MD for:  hives   Complete by: As directed    Call MD for:  persistant dizziness or light-headedness   Complete by: As directed    Call MD for:  persistant nausea and vomiting   Complete by: As directed    Call MD for:  severe uncontrolled pain   Complete by: As directed    Call MD for:  temperature >100.4   Complete by: As directed    Diet general   Complete by: As directed    Discharge instructions   Complete by: As directed    Recommendations at discharge:   Ensure compliance with insulin, blood glucose monitoring  Prescriptions for psych meds sent for limited supply. Follow-up with your mental health provider for further assessment and refills.  General  discharge instructions: Follow with Primary MD Georganna Skeans, MD in 7 days  Please request your PCP  to go over your hospital tests, procedures, radiology results at the follow up. Please get your medicines reviewed and adjusted.  Your PCP may decide to repeat certain labs or tests as needed. Do not drive, operate heavy machinery, perform activities at heights, swimming or participation in water activities or provide baby sitting services if your were admitted for syncope or siezures until you have seen by Primary MD or a Neurologist and advised to do so again. [place] Controlled Substance Reporting System database was reviewed. Do not drive, operate heavy machinery, perform activities at heights, swim, participate in  water activities or provide baby-sitting services while on medications for pain, sleep and mood until your outpatient physician has reevaluated you and advised to do so again.  You are strongly recommended to comply with the dose, frequency and duration of prescribed medications. Activity: As tolerated with Full fall precautions use walker/cane & assistance as needed Avoid using any recreational substances like cigarette, tobacco, alcohol, or non-prescribed drug. If you experience worsening of your admission symptoms, develop shortness of breath, life threatening emergency, suicidal or homicidal thoughts you must seek medical attention immediately by calling 911 or calling your MD immediately  if symptoms less severe. You must read complete instructions/literature along with all the possible adverse reactions/side effects for all the medicines you take and that have been prescribed to you. Take any new medicine only after you have completely understood and accepted all the possible adverse reactions/side effects.  Wear Seat belts while driving. You were cared for by a hospitalist during your hospital stay. If you have any questions about your discharge medications or the care you  received while you were in the hospital after you are discharged, you can call the unit and ask to speak with the hospitalist or the covering physician. Once you are discharged, your primary care physician will handle any further medical issues. Please note that NO REFILLS for any discharge medications will be authorized once you are discharged, as it is imperative that you return to your primary care physician (or establish a relationship with a primary care physician if you do not have one).   Increase activity slowly   Complete by: As directed        Discharge Medications:   Allergies as of 12/14/2022   No Known Allergies      Medication List     STOP taking these medications    risperiDONE 3 MG disintegrating tablet Commonly known as: RISPERDAL M-TABS   risperiDONE 4 MG disintegrating tablet Commonly known as: RISPERDAL M-TABS       TAKE these medications    Accu-Chek Guide test strip Generic drug: glucose blood Use as directed to check blood sugar three times daily   Accu-Chek Guide w/Device Kit Use 3 (three) times daily.   Accu-Chek Softclix Lancets lancets Use as directed to check blood sugar 3 times daily.   amLODipine 10 MG tablet Commonly known as: NORVASC Take 1 tablet (10 mg total) by mouth daily.   atorvastatin 80 MG tablet Commonly known as: Lipitor Take 1 tablet (80 mg total) by mouth daily. Elevated cholesterol   benztropine 1 MG tablet Commonly known as: COGENTIN Take 1 tablet (1 mg total) by mouth 2 (two) times daily for 7 days. What changed: Another medication with the same name was removed. Continue taking this medication, and follow the directions you see here.   docusate sodium 100 MG capsule Commonly known as: COLACE Take 1 capsule (100 mg total) by mouth 2 (two) times daily.   ferrous sulfate 325 (65 FE) MG tablet Take 1 tablet (325 mg total) by mouth daily with breakfast.   haloperidol 0.5 MG tablet Commonly known as: HALDOL Take 1  tablet (0.5 mg total) by mouth 2 (two) times daily as needed for up to 7 days for agitation.   hydrOXYzine 25 MG tablet Commonly known as: ATARAX Take 1 tablet (25 mg total) by mouth 3 (three) times daily as needed for anxiety. For anxiety   Lancet Device Misc 1 each by Does not apply route 3 (three) times daily. May dispense  any manufacturer covered by [employer].   lisinopril 40 MG tablet Commonly known as: ZESTRIL Take 1 tablet (40 mg total) by mouth daily.   metoCLOPramide 10 MG tablet Commonly known as: REGLAN Take 1 tablet (10 mg total) by mouth every 8 (eight) hours as needed for up to 7 days for nausea. What changed:  when to take this reasons to take this   metoprolol tartrate 50 MG tablet Commonly known as: LOPRESSOR Take 1 tablet (50 mg total) by mouth 2 (two) times daily.   NovoLIN 70/30 Kwikpen (70-30) 100 UNIT/ML KwikPen Generic drug: insulin isophane & regular human KwikPen Inject 50 Units into the skin 2 (two) times daily with a meal.   PARoxetine 20 MG tablet Commonly known as: PAXIL Take 1 tablet (20 mg total) by mouth daily for 7 days.   senna-docusate 8.6-50 MG tablet Commonly known as: Senokot-S Take 2 tablets by mouth 2 (two) times daily as needed for moderate constipation.   TechLite Pen Needles 31G X 5 MM Misc Generic drug: Insulin Pen Needle Use with insulin 3 (three) times daily.   traZODone 50 MG tablet Commonly known as: DESYREL Take 1 tablet (50 mg total) by mouth at bedtime as needed for sleep. For sleep         The results of significant diagnostics from this hospitalization (including imaging, microbiology, ancillary and laboratory) are listed below for reference.    Procedures and Diagnostic Studies:   CT ABDOMEN PELVIS W CONTRAST  Result Date: 12/13/2022 CLINICAL DATA:  Epigastric abdominal pain EXAM: CT ABDOMEN AND PELVIS WITH CONTRAST TECHNIQUE: Multidetector CT imaging of the abdomen and pelvis was performed using  the standard protocol following bolus administration of intravenous contrast. RADIATION DOSE REDUCTION: This exam was performed according to the departmental dose-optimization program which includes automated exposure control, adjustment of the mA and/or kV according to patient size and/or use of iterative reconstruction technique. CONTRAST:  OMNIPAQUE IOHEXOL 300 MG/ML  SOLN COMPARISON:  None Available. FINDINGS: Lower chest: No acute abnormality.  Small hiatal hernia. Hepatobiliary: No focal liver abnormality is seen. Status post cholecystectomy. No biliary dilatation. Pancreas: Unremarkable Spleen: Unremarkable Adrenals/Urinary Tract: The adrenal glands are unremarkable. The kidneys are normal in size and position. 3 mm nonobstructing calculus noted within the interpolar region of the right kidney. The kidneys are otherwise unremarkable. Bladder unremarkable. Stomach/Bowel: Stomach is within normal limits. Appendix appears normal. No evidence of bowel wall thickening, distention, or inflammatory changes. Vascular/Lymphatic: Minimal atherosclerotic calcification within the abdominal aorta. No aortic aneurysm. No pathologic adenopathy within the abdomen and pelvis. Reproductive: Uterus and bilateral adnexa are unremarkable. Other: Moderate fat containing umbilical hernia. Musculoskeletal: No acute or significant osseous findings. IMPRESSION: 1. No acute intra-abdominal pathology identified. No definite radiographic explanation for the patient's reported symptoms. 2. Small hiatal hernia. 3. Minimal right nonobstructing nephrolithiasis. 4. Moderate fat containing umbilical hernia. Aortic Atherosclerosis (ICD10-I70.0). Electronically Signed   By: Helyn Numbers M.D.   On: 12/13/2022 00:46   DG Chest Portable 1 View  Result Date: 12/13/2022 CLINICAL DATA:  Chest pain. EXAM: PORTABLE CHEST 1 VIEW COMPARISON:  11/08/2022 FINDINGS: The cardiomediastinal contours are normal. The lungs are clear. Pulmonary  vasculature is normal. No consolidation, pleural effusion, or pneumothorax. No acute osseous abnormalities are seen. IMPRESSION: No acute chest findings. Electronically Signed   By: Narda Rutherford M.D.   On: 12/13/2022 00:00     Labs:   Basic Metabolic Panel: Recent Labs  Lab 12/12/22 1823 12/13/22 0522 12/14/22 9562  NA 132* 130* 134*  K 4.4 4.3 3.3*  CL 100 97* 99  CO2 20* 20* 23  GLUCOSE 329* 331* 198*  BUN 10 11 13   CREATININE 0.60 0.77 0.58  CALCIUM 9.7 9.1 8.9  MG  --  1.9 2.0  PHOS  --  3.8 3.8   GFR Estimated Creatinine Clearance: 132.2 mL/min (by C-G formula based on SCr of 0.58 mg/dL). Liver Function Tests: Recent Labs  Lab 12/12/22 2054  AST 15  ALT 20  ALKPHOS 82  BILITOT 0.7  PROT 8.6*  ALBUMIN 3.8   No results for input(s): "LIPASE", "AMYLASE" in the last 168 hours. No results for input(s): "AMMONIA" in the last 168 hours. Coagulation profile No results for input(s): "INR", "PROTIME" in the last 168 hours.  CBC: Recent Labs  Lab 12/12/22 1823 12/13/22 0522 12/14/22 0606  WBC 13.2* 11.3* 12.9*  NEUTROABS  --   --  9.4*  HGB 13.1 12.0 11.8*  HCT 42.3 38.2 37.5  MCV 78.5* 78.3* 79.3*  PLT 557* 448* 425*   Cardiac Enzymes: No results for input(s): "CKTOTAL", "CKMB", "CKMBINDEX", "TROPONINI" in the last 168 hours. BNP: Invalid input(s): "POCBNP" CBG: Recent Labs  Lab 12/13/22 1702 12/13/22 2103 12/14/22 0014 12/14/22 0506 12/14/22 0716  GLUCAP 187* 166* 186* 160* 211*   D-Dimer No results for input(s): "DDIMER" in the last 72 hours. Hgb A1c No results for input(s): "HGBA1C" in the last 72 hours. Lipid Profile No results for input(s): "CHOL", "HDL", "LDLCALC", "TRIG", "CHOLHDL", "LDLDIRECT" in the last 72 hours. Thyroid function studies No results for input(s): "TSH", "T4TOTAL", "T3FREE", "THYROIDAB" in the last 72 hours.  Invalid input(s): "FREET3" Anemia work up No results for input(s): "VITAMINB12", "FOLATE", "FERRITIN",  "TIBC", "IRON", "RETICCTPCT" in the last 72 hours. Microbiology No results found for this or any previous visit (from the past 240 hour(s)).  Time coordinating discharge: 45 minutes  Signed: Cianna Kasparian  [facility] 12/14/2022, 11:51 AM

## 2022-12-15 ENCOUNTER — Ambulatory Visit (INDEPENDENT_AMBULATORY_CARE_PROVIDER_SITE_OTHER): Payer: Medicaid Other | Admitting: Family

## 2022-12-15 ENCOUNTER — Telehealth: Payer: Self-pay

## 2022-12-15 VITALS — BP 149/83 | HR 67 | Temp 98.5°F | Resp 15 | Ht 72.0 in | Wt 267.2 lb

## 2022-12-15 DIAGNOSIS — E1165 Type 2 diabetes mellitus with hyperglycemia: Secondary | ICD-10-CM | POA: Diagnosis not present

## 2022-12-15 DIAGNOSIS — I10 Essential (primary) hypertension: Secondary | ICD-10-CM

## 2022-12-15 DIAGNOSIS — Z09 Encounter for follow-up examination after completed treatment for conditions other than malignant neoplasm: Secondary | ICD-10-CM

## 2022-12-15 DIAGNOSIS — F1210 Cannabis abuse, uncomplicated: Secondary | ICD-10-CM

## 2022-12-15 DIAGNOSIS — R112 Nausea with vomiting, unspecified: Secondary | ICD-10-CM | POA: Diagnosis not present

## 2022-12-15 DIAGNOSIS — F319 Bipolar disorder, unspecified: Secondary | ICD-10-CM

## 2022-12-15 DIAGNOSIS — D509 Iron deficiency anemia, unspecified: Secondary | ICD-10-CM | POA: Diagnosis not present

## 2022-12-15 DIAGNOSIS — Z794 Long term (current) use of insulin: Secondary | ICD-10-CM

## 2022-12-15 NOTE — Progress Notes (Signed)
PT for med refills

## 2022-12-15 NOTE — Transitions of Care (Post Inpatient/ED Visit) (Unsigned)
   12/15/2022  Name: Bethanny Eick MRN: [id number] DOB: 20-Feb-1976  Today's TOC FU Call Status: Today's TOC FU Call Status:: Unsuccessul Call (1st Attempt) Unsuccessful Call (1st Attempt) Date: 12/15/22  Attempted to reach the patient regarding the most recent Inpatient/ED visit.  Follow Up Plan: Additional outreach attempts will be made to reach the patient to complete the Transitions of Care (Post Inpatient/ED visit) call.   Signature  Fredirick Maudlin

## 2022-12-20 ENCOUNTER — Telehealth: Payer: Self-pay

## 2022-12-20 NOTE — Transitions of Care (Post Inpatient/ED Visit) (Signed)
   12/20/2022  Name: [name] MRN: [id number] DOB: 06/06/1976  Today's TOC FU Call Status:    Attempted to reach the patient regarding the most recent Inpatient/ED visit.  Follow Up Plan:  ALREADY SEEN BY PCP  Signature  TB,CMA

## 2022-12-21 ENCOUNTER — Encounter: Payer: Self-pay | Admitting: Family Medicine

## 2022-12-21 ENCOUNTER — Ambulatory Visit (INDEPENDENT_AMBULATORY_CARE_PROVIDER_SITE_OTHER): Payer: Medicaid Other | Admitting: Family Medicine

## 2022-12-21 ENCOUNTER — Other Ambulatory Visit (HOSPITAL_COMMUNITY): Payer: Self-pay

## 2022-12-21 VITALS — BP 143/88 | HR 84 | Temp 98.1°F | Resp 16 | Wt 280.2 lb

## 2022-12-21 DIAGNOSIS — I10 Essential (primary) hypertension: Secondary | ICD-10-CM

## 2022-12-21 DIAGNOSIS — F319 Bipolar disorder, unspecified: Secondary | ICD-10-CM | POA: Diagnosis not present

## 2022-12-21 MED ORDER — BENZTROPINE MESYLATE 1 MG PO TABS
1.0000 mg | ORAL_TABLET | Freq: Two times a day (BID) | ORAL | 0 refills | Status: AC
  Filled 2022-12-21: qty 60, 30d supply, fill #0

## 2022-12-21 MED ORDER — ATORVASTATIN CALCIUM 80 MG PO TABS
80.0000 mg | ORAL_TABLET | Freq: Every day | ORAL | 1 refills | Status: DC
  Filled 2022-12-21: qty 90, 90d supply, fill #0

## 2022-12-21 MED ORDER — LANCET DEVICE MISC
1.0000 | Freq: Three times a day (TID) | 3 refills | Status: AC
  Filled 2022-12-21: qty 1, fill #0

## 2022-12-21 MED ORDER — METOCLOPRAMIDE HCL 10 MG PO TABS
10.0000 mg | ORAL_TABLET | Freq: Three times a day (TID) | ORAL | 2 refills | Status: DC
  Filled 2022-12-21: qty 90, 30d supply, fill #0

## 2022-12-21 MED ORDER — METOPROLOL TARTRATE 50 MG PO TABS
50.0000 mg | ORAL_TABLET | Freq: Two times a day (BID) | ORAL | 1 refills | Status: DC
  Filled 2022-12-21 – 2023-01-08 (×2): qty 180, 90d supply, fill #0

## 2022-12-21 MED ORDER — AMLODIPINE BESYLATE 10 MG PO TABS
10.0000 mg | ORAL_TABLET | Freq: Every day | ORAL | 1 refills | Status: DC
  Filled 2022-12-21 – 2023-01-08 (×2): qty 90, 90d supply, fill #0

## 2022-12-21 MED ORDER — HYDROXYZINE HCL 25 MG PO TABS: 25.0000 mg | ORAL_TABLET | Freq: Three times a day (TID) | ORAL | 1 refills | Status: DC | PRN

## 2022-12-21 MED ORDER — PEN NEEDLES 31G X 5 MM MISC
1.0000 | Freq: Three times a day (TID) | 3 refills | Status: DC
  Filled 2022-12-21: qty 100, 33d supply, fill #0

## 2022-12-21 MED ORDER — HALOPERIDOL 0.5 MG PO TABS
0.5000 mg | ORAL_TABLET | Freq: Two times a day (BID) | ORAL | 0 refills | Status: DC | PRN
  Filled 2022-12-21: qty 60, 30d supply, fill #0

## 2022-12-21 MED ORDER — TRAZODONE HCL 50 MG PO TABS: 50.0000 mg | ORAL_TABLET | Freq: Every evening | ORAL | 1 refills | Status: DC | PRN

## 2022-12-21 MED ORDER — LISINOPRIL 40 MG PO TABS
40.0000 mg | ORAL_TABLET | Freq: Every day | ORAL | 1 refills | Status: DC
  Filled 2022-12-21 – 2023-01-08 (×2): qty 90, 90d supply, fill #0

## 2022-12-21 MED ORDER — NOVOLIN 70/30 FLEXPEN RELION (70-30) 100 UNIT/ML ~~LOC~~ SUPN
50.0000 [IU] | PEN_INJECTOR | Freq: Two times a day (BID) | SUBCUTANEOUS | 0 refills | Status: DC
  Filled 2022-12-21: qty 90, 90d supply, fill #0

## 2022-12-21 MED ORDER — PAROXETINE HCL 20 MG PO TABS
20.0000 mg | ORAL_TABLET | Freq: Every day | ORAL | 0 refills | Status: AC
  Filled 2022-12-21: qty 30, 30d supply, fill #0

## 2022-12-21 NOTE — Progress Notes (Signed)
Medication refill for patient . Patient has no other concerns today

## 2022-12-22 ENCOUNTER — Other Ambulatory Visit (HOSPITAL_COMMUNITY): Payer: Self-pay

## 2022-12-25 ENCOUNTER — Encounter: Payer: Self-pay | Admitting: Family Medicine

## 2022-12-25 NOTE — Progress Notes (Signed)
Established Patient Office Visit  Subjective    Patient ID: [name], female    DOB: 08-25-1975  Age: 47 y.o. MRN: [id number]  CC:  Chief Complaint  Patient presents with   Medication Refill    HPI [name] presents for refills of MH meds. She does have an upcoming appt with them for med management. She reports that she has had increased tremors 2/2 to no having her meds.    Outpatient Encounter Medications as of 12/21/2022  Medication Sig   acetaminophen (TYLENOL) 325 MG tablet Take by mouth.   ARIPiprazole (ABILIFY) 15 MG tablet Take by mouth.   Blood Glucose Monitoring Suppl (BLOOD GLUCOSE MONITOR SYSTEM) w/Device KIT Use 3 (three) times daily.   docusate sodium (COLACE) 100 MG capsule Take 1 capsule (100 mg total) by mouth 2 (two) times daily.   ferrous sulfate 325 (65 FE) MG tablet Take 1 tablet (325 mg total) by mouth daily with breakfast.   Glucose Blood (BLOOD GLUCOSE TEST STRIPS) STRP Use as directed to check blood sugar three times daily   insulin lispro (HUMALOG) 100 UNIT/ML injection Inject into the skin.   Lancets MISC Use as directed to check blood sugar 3 times daily.   [DISCONTINUED] amLODipine (NORVASC) 10 MG tablet Take 1 tablet (10 mg total) by mouth daily.   [DISCONTINUED] atorvastatin (LIPITOR) 80 MG tablet Take 1 tablet (80 mg total) by mouth daily. Elevated cholesterol   [DISCONTINUED] benztropine (COGENTIN) 1 MG tablet Take 1 tablet (1 mg total) by mouth 2 (two) times daily for 7 days.   [DISCONTINUED] haloperidol (HALDOL) 0.5 MG tablet Take 1 tablet (0.5 mg total) by mouth 2 (two) times daily as needed for up to 7 days for agitation.   [DISCONTINUED] hydrOXYzine (ATARAX) 25 MG tablet Take 1 tablet (25 mg total) by mouth 3 (three) times daily as needed for anxiety. For anxiety   [DISCONTINUED] insulin isophane & regular human KwikPen (NOVOLIN 70/30 KWIKPEN) (70-30) 100 UNIT/ML KwikPen Inject 50 Units into the skin 2 (two) times daily with a meal.    [DISCONTINUED] Insulin Pen Needle (PEN NEEDLES) 31G X 5 MM MISC Use with insulin 3 (three) times daily.   [DISCONTINUED] Lancet Device MISC 1 each by Does not apply route 3 (three) times daily. May dispense any manufacturer covered by patient's insurance.   [DISCONTINUED] lisinopril (ZESTRIL) 40 MG tablet Take 1 tablet (40 mg total) by mouth daily.   [DISCONTINUED] metoCLOPramide (REGLAN) 10 MG tablet Take 1 tablet (10 mg total) by mouth every 8 (eight) hours as needed for up to 7 days for nausea.   [DISCONTINUED] metoprolol tartrate (LOPRESSOR) 50 MG tablet Take 1 tablet (50 mg total) by mouth 2 (two) times daily.   [DISCONTINUED] PARoxetine (PAXIL) 20 MG tablet Take 1 tablet (20 mg total) by mouth daily for 7 days.   [DISCONTINUED] traZODone (DESYREL) 50 MG tablet Take 1 tablet (50 mg total) by mouth at bedtime as needed for sleep. For sleep   amLODipine (NORVASC) 10 MG tablet Take 1 tablet (10 mg total) by mouth daily.   atorvastatin (LIPITOR) 80 MG tablet Take 1 tablet (80 mg total) by mouth daily. Elevated cholesterol   benztropine (COGENTIN) 1 MG tablet Take 1 tablet (1 mg total) by mouth 2 (two) times daily.   haloperidol (HALDOL) 0.5 MG tablet Take 1 tablet (0.5 mg total) by mouth 2 (two) times daily as needed for agitation.   hydrOXYzine (ATARAX) 25 MG tablet Take 1 tablet (25 mg total) by  mouth 3 (three) times daily as needed for anxiety. For anxiety   insulin isophane & regular human KwikPen (NOVOLIN 70/30 KWIKPEN) (70-30) 100 UNIT/ML KwikPen Inject 50 Units into the skin 2 (two) times daily with a meal.   Insulin Pen Needle (PEN NEEDLES) 31G X 5 MM MISC Use with insulin 3 (three) times daily.   Lancet Device MISC 1 each by Does not apply route 3 (three) times daily. May dispense any manufacturer covered by patient's insurance.   lisinopril (ZESTRIL) 40 MG tablet Take 1 tablet (40 mg total) by mouth daily.   metoCLOPramide (REGLAN) 10 MG tablet Take 1 tablet (10 mg total) by mouth 3  (three) times daily before meals.   metoprolol tartrate (LOPRESSOR) 50 MG tablet Take 1 tablet (50 mg total) by mouth 2 (two) times daily.   PARoxetine (PAXIL) 20 MG tablet Take 1 tablet (20 mg total) by mouth daily.   senna-docusate (SENOKOT-S) 8.6-50 MG tablet Take 2 tablets by mouth 2 (two) times daily as needed for moderate constipation. (Patient not taking: Reported on 12/13/2022)   traZODone (DESYREL) 50 MG tablet Take 1 tablet (50 mg total) by mouth at bedtime as needed for sleep. For sleep   No facility-administered encounter medications on file as of 12/21/2022.    Past Medical History:  Diagnosis Date   Abnormal Pap smear    Bipolar 1 disorder (HCC)    Diabetes mellitus without complication (HCC)    Fibroids    Hypertension    IBS (irritable bowel syndrome)     Past Surgical History:  Procedure Laterality Date   CERVICAL BIOPSY     CHOLECYSTECTOMY     ESSURE TUBAL LIGATION     HIATAL HERNIA REPAIR     TUBAL LIGATION      Family History  Problem Relation Age of Onset   Diabetes Father    Diabetes Paternal Grandmother    Diabetes Maternal Grandmother    Mental illness Cousin    Healthy Mother     Social History   Socioeconomic History   Marital status: Single    Spouse name: Not on file   Number of children: 2   Years of education: Not on file   Highest education level: Master's degree (e.g., MA, MS, MEng, MEd, MSW, MBA)  Occupational History   Not on file  Tobacco Use   Smoking status: Former    Types: Cigarettes   Smokeless tobacco: Never  Vaping Use   Vaping Use: Some days   Substances: Nicotine, Flavoring  Substance and Sexual Activity   Alcohol use: Not Currently    Comment: occasional   Drug use: Not Currently   Sexual activity: Yes    Birth control/protection: Surgical  Other Topics Concern   Not on file  Social History Narrative   ** Merged History Encounter **       Social Determinants of Health   Financial Resource Strain: Not on  file  Food Insecurity: No Food Insecurity (12/13/2022)   Hunger Vital Sign    Worried About Running Out of Food in the Last Year: Never true    Ran Out of Food in the Last Year: Never true  Transportation Needs: Unmet Transportation Needs (12/15/2022)   PRAPARE - Transportation    Lack of Transportation (Medical): Yes    Lack of Transportation (Non-Medical): Yes  Physical Activity: Sufficiently Active (12/15/2022)   Exercise Vital Sign    Days of Exercise per Week: 4 days    Minutes of Exercise per  Session: 140 min  Stress: Stress Concern Present (12/15/2022)   [employer] of Occupational Health - Occupational Stress Questionnaire    Feeling of Stress : Very much  Social Connections: Unknown (12/15/2022)   Social Connection and Isolation Panel [NHANES]    Frequency of Communication with Friends and Family: Not on file    Frequency of Social Gatherings with Friends and Family: Not on file    Attends Religious Services: Not on file    Active Member of Clubs or Organizations: Yes    Attends Banker Meetings: Not on file    Marital Status: Not on file  Intimate Partner Violence: Not At Risk (12/13/2022)   Humiliation, Afraid, Rape, and Kick questionnaire    Fear of Current or Ex-Partner: No    Emotionally Abused: No    Physically Abused: No    Sexually Abused: No    Review of Systems  Neurological:  Positive for tremors.  Psychiatric/Behavioral:  Negative for suicidal ideas.   All other systems reviewed and are negative.       Objective    BP (!) 143/88   Pulse 84   Temp 98.1 F (36.7 C) (Oral)   Resp 16   Wt 280 lb 3.2 oz (127.1 kg)   SpO2 98%   BMI 38.00 kg/m   Physical Exam Vitals and nursing note reviewed.  Constitutional:      General: She is not in acute distress. Cardiovascular:     Rate and Rhythm: Normal rate and regular rhythm.  Pulmonary:     Effort: Pulmonary effort is normal.     Breath sounds: Normal breath sounds.  Neurological:      General: No focal deficit present.     Mental Status: She is alert and oriented to person, place, and time.     Motor: Tremor present.         Assessment & Plan:   1. Bipolar affective disorder, remission status unspecified (HCC) Previous known dose of cogentin and haldol refilled for 1 month so that patient can keep scheduled appt with consultant for further eval/mgt  2. Essential hypertension Slightly elevated readings. Continue present management and monitor    Return in about 2 months (around 02/20/2023) for follow up.   Tommie Raymond, MD

## 2023-01-08 ENCOUNTER — Other Ambulatory Visit: Payer: Self-pay | Admitting: *Deleted

## 2023-01-08 ENCOUNTER — Other Ambulatory Visit (HOSPITAL_COMMUNITY): Payer: Self-pay

## 2023-01-25 ENCOUNTER — Telehealth: Payer: Self-pay | Admitting: Family Medicine

## 2023-01-30 ENCOUNTER — Ambulatory Visit: Payer: Self-pay | Admitting: *Deleted

## 2023-01-30 NOTE — Telephone Encounter (Signed)
Summary: Medication Clarification   [facility] did not have 70/30 Novolin, so she bought the Long Acting instead of 70/30, and needs to know what dosage will be? Please advise.   Patients callback # [phone]          Chief Complaint: Medication Symptoms: NA Frequency: NA Pertinent Negatives: Patient denies NA Disposition: [] ED /[] Urgent Care (no appt availability in office) / [] Appointment(In office/virtual)/ []  [place] [facility]/ [] Home Care/ [] Refused Recommended Disposition /[] [place] [facility]/ [x]  Follow-up with PCP Additional Notes:  Spoke with pt. States pharmacy is unable to get 70/30 novolin insulin, on back order. States they recommended long acting and to ask PCP what is the appropriate dosage.  States she has picked up one vial of the long lasting. Pt does not have any other insulin. Please advise    Reason for Disposition  [1] Caller has URGENT medicine question about med that PCP or specialist prescribed AND [2] triager unable to answer question  Answer Assessment - Initial Assessment Questions 1. NAME of MEDICINE: "What medicine(s) are you calling about?"     Novolog 2. QUESTION: "What is your question?" (e.g., double dose of medicine, side effect)     What dose of long acting 3. PRESCRIBER: "Who prescribed the medicine?" Reason: if prescribed by specialist, call should be referred to that group.     PCP  Protocols used: Medication Question Call-A-AH

## 2023-01-30 NOTE — Telephone Encounter (Signed)
Please advise this patient regarding his medcation

## 2023-01-31 NOTE — Telephone Encounter (Signed)
I have attempted to contact this patient by phone with the following results: no answer.  Provider need to know what type of insulin patient is using

## 2023-02-09 ENCOUNTER — Encounter: Payer: Self-pay | Admitting: Family Medicine

## 2023-02-09 ENCOUNTER — Telehealth: Payer: Self-pay | Admitting: Family Medicine

## 2023-02-09 NOTE — Telephone Encounter (Signed)
Called patient to reschedule appointment for 8/20 couldn't leave message sent message in MyChart about rescheduling ( if patients call [facility] is okay to reschedule)

## 2023-02-15 ENCOUNTER — Other Ambulatory Visit: Payer: Self-pay | Admitting: Family Medicine

## 2023-02-15 NOTE — Telephone Encounter (Signed)
Medication Refill - Medication: insulin isophane & regular human KwikPen (NOVOLIN 70/30 KWIKPEN) (70-30) 100 UNIT/ML KwikPen [[id number]] ferrous sulfate 325 (65 FE) MG tablet [[id number]]   Has the patient contacted their pharmacy? Yes.     (Agent: If yes, when and what did the pharmacy advise?) Contact PCP   Preferred Pharmacy (with phone number or street name): [facility]   Has the patient been seen for an appointment in the last year OR does the patient have an upcoming appointment? Yes.    Agent: Please be advised that RX refills may take up to 3 business days. We ask that you follow-up with your pharmacy.

## 2023-02-16 MED ORDER — FERROUS SULFATE 325 (65 FE) MG PO TABS: 325.0000 mg | ORAL_TABLET | Freq: Every day | ORAL | 0 refills | Status: AC

## 2023-02-16 MED ORDER — NOVOLIN 70/30 FLEXPEN RELION (70-30) 100 UNIT/ML ~~LOC~~ SUPN: 50.0000 [IU] | PEN_INJECTOR | Freq: Two times a day (BID) | SUBCUTANEOUS | 0 refills | Status: DC

## 2023-02-16 NOTE — Telephone Encounter (Signed)
Requested Prescriptions  Pending Prescriptions Disp Refills   insulin isophane & regular human KwikPen (NOVOLIN 70/30 KWIKPEN) (70-30) 100 UNIT/ML KwikPen 90 mL 0    Sig: Inject 50 Units into the skin 2 (two) times daily with a meal.     Endocrinology:  Diabetes - Insulins Failed - 02/15/2023  1:40 PM      Failed - HBA1C is between 0 and 7.9 and within 180 days    Hgb A1c MFr Bld  Date Value Ref Range Status  11/08/2022 11.5 (H) 4.8 - 5.6 % Final    Comment:    (NOTE) Pre diabetes:          5.7%-6.4%  Diabetes:              >6.4%  Glycemic control for   <7.0% adults with diabetes          Passed - Valid encounter within last 6 months    Recent Outpatient Visits           1 month ago Bipolar affective disorder, remission status unspecified (HCC)   [facility], MD   2 months ago Hospital discharge follow-up   [facility], Amy J, NP   1 year ago Dependent edema   [facility], Amy J, NP   1 year ago Type 2 diabetes mellitus with diabetic polyneuropathy, without long-term current use of insulin (HCC)   [place] [facility], Amy J, NP   1 year ago Type 2 diabetes mellitus with diabetic polyneuropathy, without long-term current use of insulin (HCC)   [facility], Amy J, NP               ferrous sulfate 325 (65 FE) MG tablet 90 tablet 0    Sig: Take 1 tablet (325 mg total) by mouth daily with breakfast.     Endocrinology:  Minerals - Iron Supplementation Failed - 02/15/2023  1:40 PM      Failed - HGB in normal range and within 360 days    Hemoglobin  Date Value Ref Range Status  12/14/2022 11.8 (L) 12.0 - 15.0 g/dL Final  28/41/3244 01.0 11.1 - 15.9 g/dL Final         Passed - HCT in normal range and within 360 days    HCT  Date Value Ref Range Status  12/14/2022 37.5 36.0 -  46.0 % Final   Hematocrit  Date Value Ref Range Status  02/25/2020 38.5 34.0 - 46.6 % Final         Passed - RBC in normal range and within 360 days    RBC  Date Value Ref Range Status  12/14/2022 4.73 3.87 - 5.11 MIL/uL Final         Passed - Fe (serum) in normal range and within 360 days    Iron  Date Value Ref Range Status  11/11/2022 46 28 - 170 ug/dL Final   Saturation Ratios  Date Value Ref Range Status  11/11/2022 9 (L) 10.4 - 31.8 % Final         Passed - Ferritin in normal range and within 360 days    Ferritin  Date Value Ref Range Status  11/11/2022 16 11 - 307 ng/mL Final    Comment:    Performed at [facility], [address] [place], [place]  [address]         Passed - Valid encounter within last 12 months    Recent Outpatient Visits           1 month ago Bipolar affective disorder, remission status unspecified (HCC)   [facility], MD   2 months ago Hospital discharge follow-up   [facility] [place], NP   1 year ago Dependent edema   [facility] [facility], Amy J, NP   1 year ago Type 2 diabetes mellitus with diabetic polyneuropathy, without long-term current use of insulin [facility])   [place] [facility], Amy J, NP   1 year ago Type 2 diabetes mellitus with diabetic polyneuropathy, without long-term current use of insulin [facility])   [place] [facility], Salomon Fick, NP

## 2023-02-20 ENCOUNTER — Ambulatory Visit: Payer: Medicaid Other | Admitting: Family Medicine

## 2023-03-06 ENCOUNTER — Ambulatory Visit: Payer: Self-pay | Admitting: *Deleted

## 2023-03-06 NOTE — Telephone Encounter (Signed)
  Chief Complaint: medication question regarding insulin, current dose humalog 70/30 insulin 50 units in am and 50 units in pm. [facility] out of stock. What should she take now? Symptoms: will be out of 70/30 insulin tomorrow.  Frequency: na Pertinent Negatives: Patient denies na Disposition: [] ED /[] Urgent Care (no appt availability in office) / [] Appointment(In office/virtual)/ []  [place] [facility]/ [] Home Care/ [] Refused Recommended Disposition /[] [place] [facility]/ [x]  Follow-up with PCP Additional Notes:   Please advise and order new Rx that can take place of humalog 70 /30 until pharmacy can get stock. Told it will be extended time. Please advise patient would like a call back.    Reason for Disposition  [1] Caller has URGENT medicine question about med that PCP or specialist prescribed AND [2] triager unable to answer question  Answer Assessment - Initial Assessment Questions 1. NAME of MEDICINE: "What medicine(s) are you calling about?"     Humalog 70/30 kwikpen out of stock at [facility] 2. QUESTION: "What is your question?" (e.g., double dose of medicine, side effect)     Current humalog 70/30 kwikpen out of stock and would like to know what other insulin to use or order? 3. PRESCRIBER: "Who prescribed the medicine?" Reason: if prescribed by specialist, call should be referred to that group.     PCP 4. SYMPTOMS: "Do you have any symptoms?" If Yes, ask: "What symptoms are you having?"  "How bad are the symptoms (e.g., mild, moderate, severe)     Almost out of 70 /30 insulin . Will be out tomorrow 5. PREGNANCY:  "Is there any chance that you are pregnant?" "When was your last menstrual period?"     no  Protocols used: Medication Question Call-A-AH

## 2023-03-07 NOTE — Telephone Encounter (Signed)
I have attempted to contact this patient by phone with the following results: no answer.

## 2023-03-09 ENCOUNTER — Other Ambulatory Visit: Payer: Self-pay | Admitting: Family Medicine

## 2023-03-09 ENCOUNTER — Other Ambulatory Visit (HOSPITAL_COMMUNITY): Payer: Self-pay

## 2023-03-09 MED ORDER — INSULIN LISPRO PROT & LISPRO (75-25 MIX) 100 UNIT/ML KWIKPEN
45.0000 [IU] | PEN_INJECTOR | Freq: Two times a day (BID) | SUBCUTANEOUS | 3 refills | Status: DC
  Filled 2023-03-09: qty 15, 17d supply, fill #0

## 2023-03-19 ENCOUNTER — Other Ambulatory Visit: Payer: Self-pay

## 2023-03-19 NOTE — Progress Notes (Signed)
[name] July 09, 1975 [id number]  Patient attempted to be outreached by Thomasene Ripple, PharmD Candidate to discuss hypertension.   Thomasene Ripple, Student-PharmD

## 2023-03-20 ENCOUNTER — Encounter: Payer: Self-pay | Admitting: Family Medicine

## 2023-03-26 ENCOUNTER — Other Ambulatory Visit (HOSPITAL_COMMUNITY): Payer: Self-pay

## 2023-04-11 ENCOUNTER — Ambulatory Visit: Payer: Self-pay

## 2023-04-11 NOTE — Telephone Encounter (Signed)
  Chief Complaint: head pain Symptoms: head pain in back of head on L side, constant, 8/10, light sensitivity, tender to touch, vision changes  Frequency: 2 weeks  Pertinent Negatives: Patient denies dizziness, unusual smell  Disposition: [x] ED /[] Urgent Care (no appt availability in office) / [] Appointment(In office/virtual)/ []  [place] [facility]/ [] Home Care/ [] Refused Recommended Disposition /[] [place] [facility]/ []  Follow-up with PCP Additional Notes: pt called reporting the above sx, pt hasn't tried any OTC, only takes prescribed meds. Hasn't checked BP recently. No appts until 05/02/23, advised to try Tylenol or Excedrin Migraine and if no improvement in 2-3 hours to go to ED. Pt verbalized understanding.    Reason for Disposition  [1] SEVERE headache AND [2] sudden-onset (i.e., reaching maximum intensity within seconds to 1 hour)  Answer Assessment - Initial Assessment Questions 1. LOCATION: "Where does it hurt?"      Back of head L sided  2. ONSET: "When did the headache start?" (Minutes, hours or days)      2 weeks  3. PATTERN: "Does the pain come and go, or has it been constant since it started?"     Constant  4. SEVERITY: "How bad is the pain?" and "What does it keep you from doing?"  (e.g., Scale 1-10; mild, moderate, or severe)   - MILD (1-3): doesn't interfere with normal activities    - MODERATE (4-7): interferes with normal activities or awakens from sleep    - SEVERE (8-10): excruciating pain, unable to do any normal activities        8/10 7. MIGRAINE: "Have you been diagnosed with migraine headaches?" If Yes, ask: "Is this headache similar?"      no 8. HEAD INJURY: "Has there been any recent injury to the head?"      no 9. OTHER SYMPTOMS: "Do you have any other symptoms?" (fever, stiff neck, eye pain, sore throat, cold symptoms)     Vision changes, light sensitivity, tender to touch  Protocols used: Headache-A-AH

## 2023-04-23 ENCOUNTER — Inpatient Hospital Stay (HOSPITAL_COMMUNITY)
Admission: AD | Admit: 2023-04-23 | Discharge: 2023-04-23 | Disposition: A | Discharge status: Home / Self Care | Payer: 59 | Attending: Emergency Medicine | Admitting: Emergency Medicine

## 2023-04-23 ENCOUNTER — Inpatient Hospital Stay (HOSPITAL_COMMUNITY): Payer: 59

## 2023-04-23 ENCOUNTER — Other Ambulatory Visit: Payer: Self-pay

## 2023-04-23 ENCOUNTER — Encounter (HOSPITAL_COMMUNITY): Payer: Self-pay | Admitting: Obstetrics and Gynecology

## 2023-04-23 DIAGNOSIS — R1031 Right lower quadrant pain: Secondary | ICD-10-CM

## 2023-04-23 DIAGNOSIS — K429 Umbilical hernia without obstruction or gangrene: Secondary | ICD-10-CM | POA: Diagnosis not present

## 2023-04-23 DIAGNOSIS — Z3202 Encounter for pregnancy test, result negative: Secondary | ICD-10-CM | POA: Diagnosis not present

## 2023-04-23 DIAGNOSIS — K5790 Diverticulosis of intestine, part unspecified, without perforation or abscess without bleeding: Secondary | ICD-10-CM | POA: Diagnosis not present

## 2023-04-23 DIAGNOSIS — R109 Unspecified abdominal pain: Secondary | ICD-10-CM | POA: Diagnosis not present

## 2023-04-23 DIAGNOSIS — N200 Calculus of kidney: Secondary | ICD-10-CM | POA: Diagnosis not present

## 2023-04-23 LAB — URINALYSIS, ROUTINE W REFLEX MICROSCOPIC
Bilirubin Urine: NEGATIVE
Glucose, UA: NEGATIVE mg/dL
Ketones, ur: 5 mg/dL — AB
Nitrite: NEGATIVE
Protein, ur: 30 mg/dL — AB
RBC / HPF: >50 RBC/hpf (ref 0–5)
Specific Gravity, Urine: 1.018 (ref 1.005–1.030)
WBC, UA: >50 WBC/hpf (ref 0–5)
pH: 5 (ref 5.0–8.0)

## 2023-04-23 LAB — TYPE AND SCREEN
ABO/RH(D): O POS
Antibody Screen: NEGATIVE

## 2023-04-23 LAB — CBC
HCT: 37.1 % (ref 36.0–46.0)
Hemoglobin: 11.9 g/dL — ABNORMAL LOW (ref 12.0–15.0)
MCH: 26.2 pg (ref 26.0–34.0)
MCHC: 32.1 g/dL (ref 30.0–36.0)
MCV: 81.7 fL (ref 80.0–100.0)
Platelets: 354 10*3/uL (ref 150–400)
RBC: 4.54 MIL/uL (ref 3.87–5.11)
RDW: 13.4 % (ref 11.5–15.5)
WBC: 13.4 10*3/uL — ABNORMAL HIGH (ref 4.0–10.5)
nRBC: 0 % (ref 0.0–0.2)

## 2023-04-23 LAB — COMPREHENSIVE METABOLIC PANEL
ALT: 19 U/L (ref 0–44)
AST: 18 U/L (ref 15–41)
Albumin: 3.5 g/dL (ref 3.5–5.0)
Alkaline Phosphatase: 91 U/L (ref 38–126)
Anion gap: 15 (ref 5–15)
BUN: 9 mg/dL (ref 6–20)
CO2: 18 mmol/L — ABNORMAL LOW (ref 22–32)
Calcium: 9.6 mg/dL (ref 8.9–10.3)
Chloride: 99 mmol/L (ref 98–111)
Creatinine, Ser: 0.67 mg/dL (ref 0.44–1.00)
GFR, Estimated: >60 mL/min (ref >60)
Glucose, Bld: 240 mg/dL — ABNORMAL HIGH (ref 70–99)
Potassium: 4 mmol/L (ref 3.5–5.1)
Sodium: 132 mmol/L — ABNORMAL LOW (ref 135–145)
Total Bilirubin: 0.8 mg/dL (ref 0.3–1.2)
Total Protein: 7.7 g/dL (ref 6.5–8.1)

## 2023-04-23 LAB — POCT PREGNANCY, URINE: Preg Test, Ur: NEGATIVE

## 2023-04-23 LAB — HCG, QUANTITATIVE, PREGNANCY: hCG, Beta Chain, Quant, S: <1 m[IU]/mL (ref <5)

## 2023-04-23 MED ORDER — IOHEXOL 350 MG/ML SOLN: 50.0000 mL | Freq: Once | INTRAVENOUS | Status: DC | PRN

## 2023-04-23 MED ORDER — HYDROMORPHONE HCL 1 MG/ML IJ SOLN
1.0000 mg | Freq: Once | INTRAMUSCULAR | Status: AC
  Administered 2023-04-23: 1 mg via INTRAVENOUS
  Filled 2023-04-23: qty 1

## 2023-04-23 MED ORDER — LACTATED RINGERS IV BOLUS
1000.0000 mL | Freq: Once | INTRAVENOUS | Status: AC
  Administered 2023-04-23: 1000 mL via INTRAVENOUS

## 2023-04-23 MED ORDER — IOHEXOL 350 MG/ML SOLN
75.0000 mL | Freq: Once | INTRAVENOUS | Status: AC | PRN
  Administered 2023-04-23: 75 mL via INTRAVENOUS

## 2023-04-23 NOTE — ED Notes (Signed)
Patient transported to CT 

## 2023-04-23 NOTE — ED Triage Notes (Signed)
Pt from home. Went to [facility]U initially because her OBGYN instructed her to do so. Pt's menstruation cycle started 10/12 and pt's cramps were unbearable and then took 800mg  Ibuprofen and provided some relief. Since then painful cramps have been on and off. Pt reports normal cycle is 4-5 days. Reports blood clots coming out as well. N/V started last night. Pt took 500mg  tylenol every 2 today. Pt c/o of 10/10 pain.

## 2023-04-23 NOTE — MAU Provider Note (Signed)
None     S Ms. [name] is a 47 y.o. [phone] patient who presents to [facility]U today with complaint of abdominal pain.   Patient presents to MAU reporting severe waves of cramping similar to a menstrual cramp so came to MAU. Has been having unprotected sex but had BTL many years ago. Pain comes in waves and makes her tearful. Also feeling nauseous. Besides BTL no other surgeries.    O BP (!) 169/100 (BP Location: Right Arm)   Pulse 97   Temp 98 F (36.7 C) (Oral)   Resp (!) 26   SpO2 100%  Physical Exam Constitutional:      Appearance: She is not toxic-appearing or diaphoretic.     Comments: Appears uncomfortable and in pain  HENT:     Head: Normocephalic and atraumatic.  Eyes:     General: No scleral icterus. Cardiovascular:     Rate and Rhythm: Normal rate and regular rhythm.     Heart sounds: No murmur heard. Pulmonary:     Effort: Pulmonary effort is normal.     Breath sounds: Normal breath sounds.  Abdominal:     General: Bowel sounds are decreased.     Palpations: Abdomen is soft.     Tenderness: There is generalized abdominal tenderness and tenderness in the right lower quadrant.     Comments: Decreased bowel sounds. Some guarding with tenderness to palpation in RLQ. No rebound.   Skin:    General: Skin is warm and dry.  Neurological:     Mental Status: She is alert.     A Medical screening exam complete RLQ abdominal pain, non-pregnant  P Urine pregnancy test negative Patient hemodynamically stable and safe for transport Called and spoke with Dr. Rush Landmark, transported to [facility], Mary Sella, MD 04/23/2023 5:19 PM

## 2023-04-23 NOTE — ED Provider Notes (Signed)
[place] EMERGENCY DEPARTMENT AT [facility] Provider Note   CSN: [id number] Arrival date & time: 04/23/23  1534     History  Chief Complaint  Patient presents with   Abdominal Pain    [name] is a 47 y.o. female with past medical history seen for IBS, diabetes, hypertension, bipolar disorder, previous psychosis who presents with concern for abnormal menstruation, concern for menstrual cycle, heavy bleeding, with blood clots, and painful abdominal pain.  Patient reports that her cycle is actually improved over the last 1 to 2 days, but has had some nausea, vomiting, and worsening right lower quadrant abdominal pain.  She reports the pain is 10/10.  She reports she has had her gallbladder removed but still has her appendix.  She reports decreased appetite over the last several days.   Abdominal Pain      Home Medications Prior to Admission medications   Medication Sig Start Date End Date Taking? Authorizing Provider  acetaminophen (TYLENOL) 325 MG tablet Take by mouth. 02/04/22   [provider]  amLODipine (NORVASC) 10 MG tablet Take 1 tablet (10 mg total) by mouth daily. 12/21/22 04/08/23  Georganna Skeans, MD  ARIPiprazole (ABILIFY) 15 MG tablet Take by mouth. 02/04/22   [provider]  atorvastatin (LIPITOR) 80 MG tablet Take 1 tablet (80 mg total) by mouth daily. Elevated cholesterol 12/21/22 01/20/23  Georganna Skeans, MD  benztropine (COGENTIN) 1 MG tablet Take 1 tablet (1 mg total) by mouth 2 (two) times daily. 12/21/22   Georganna Skeans, MD  Blood Glucose Monitoring Suppl (BLOOD GLUCOSE MONITOR SYSTEM) w/Device KIT Use 3 (three) times daily. 11/13/22   Amin, Ankit C, MD  docusate sodium (COLACE) 100 MG capsule Take 1 capsule (100 mg total) by mouth 2 (two) times daily. 11/13/22   Amin, Ankit C, MD  ferrous sulfate 325 (65 FE) MG tablet Take 1 tablet (325 mg total) by mouth daily with breakfast. 02/16/23   Georganna Skeans, MD  Glucose Blood (BLOOD GLUCOSE  TEST STRIPS) STRP Use as directed to check blood sugar three times daily 11/13/22   Amin, Ankit C, MD  haloperidol (HALDOL) 0.5 MG tablet Take 1 tablet (0.5 mg total) by mouth 2 (two) times daily as needed for agitation. 12/21/22   Georganna Skeans, MD  hydrOXYzine (ATARAX) 25 MG tablet Take 1 tablet (25 mg total) by mouth 3 (three) times daily as needed for anxiety. For anxiety 12/21/22   Georganna Skeans, MD  insulin isophane & regular human KwikPen (NOVOLIN 70/30 KWIKPEN) (70-30) 100 UNIT/ML KwikPen Inject 50 Units into the skin 2 (two) times daily with a meal. 02/16/23 05/17/23  Georganna Skeans, MD  insulin lispro (HUMALOG) 100 UNIT/ML injection Inject into the skin. 11/17/22   [provider]  Insulin Lispro Prot & Lispro (HUMALOG MIX 75/25 KWIKPEN) (75-25) 100 UNIT/ML Kwikpen Inject 45 Units into the skin 2 (two) times daily. 03/09/23   Georganna Skeans, MD  Insulin Pen Needle (PEN NEEDLES) 31G X 5 MM MISC Use with insulin 3 (three) times daily. 12/21/22   Georganna Skeans, MD  Lancet Device MISC 1 each by Does not apply route 3 (three) times daily. May dispense any manufacturer covered by patient's insurance. 12/21/22   Georganna Skeans, MD  Lancets MISC Use as directed to check blood sugar 3 times daily. 11/13/22   Amin, Ankit C, MD  lisinopril (ZESTRIL) 40 MG tablet Take 1 tablet (40 mg total) by mouth daily. 12/21/22   Georganna Skeans, MD  metoCLOPramide (REGLAN) 10  MG tablet Take 1 tablet (10 mg total) by mouth 3 (three) times daily before meals. 12/21/22   Georganna Skeans, MD  metoprolol tartrate (LOPRESSOR) 50 MG tablet Take 1 tablet (50 mg total) by mouth 2 (two) times daily. 12/21/22 04/08/23  Georganna Skeans, MD  PARoxetine (PAXIL) 20 MG tablet Take 1 tablet (20 mg total) by mouth daily. 12/21/22   Georganna Skeans, MD  senna-docusate (SENOKOT-S) 8.6-50 MG tablet Take 2 tablets by mouth 2 (two) times daily as needed for moderate constipation. Patient not taking: Reported on 12/13/2022 11/13/22   [name],  MD  traZODone (DESYREL) 50 MG tablet Take 1 tablet (50 mg total) by mouth at bedtime as needed for sleep. For sleep 12/21/22   Georganna Skeans, MD      Allergies    Patient has no known allergies.    Review of Systems   Review of Systems  Gastrointestinal:  Positive for abdominal pain.  All other systems reviewed and are negative.   Physical Exam Updated Vital Signs BP (!) 155/90   Pulse 83   Temp 98.7 F (37.1 C) (Oral)   Resp 19   Ht 6' (1.829 m)   Wt 125.6 kg   SpO2 100%   BMI 37.57 kg/m  Physical Exam Vitals and nursing note reviewed.  Constitutional:      General: She is not in acute distress.    Appearance: Normal appearance.  HENT:     Head: Normocephalic and atraumatic.  Eyes:     General:        Right eye: No discharge.        Left eye: No discharge.  Cardiovascular:     Rate and Rhythm: Normal rate and regular rhythm.     Heart sounds: No murmur heard.    No friction rub. No gallop.  Pulmonary:     Effort: Pulmonary effort is normal.     Breath sounds: Normal breath sounds.  Abdominal:     General: Bowel sounds are normal.     Palpations: Abdomen is soft.     Comments: Focal tenderness to palpation in the right lower quadrant, some guarding, no rebound, rigidity  Skin:    General: Skin is warm and dry.     Capillary Refill: Capillary refill takes less than 2 seconds.  Neurological:     Mental Status: She is alert and oriented to person, place, and time.  Psychiatric:        Mood and Affect: Mood normal.        Behavior: Behavior normal.     ED Results / Procedures / Treatments   Labs (all labs ordered are listed, but only abnormal results are displayed) Labs Reviewed  CBC - Abnormal; Notable for the following components:      Result Value   WBC 13.4 (*)    Hemoglobin 11.9 (*)    All other components within normal limits  COMPREHENSIVE METABOLIC PANEL - Abnormal; Notable for the following components:   Sodium 132 (*)    CO2 18 (*)     Glucose, Bld 240 (*)    All other components within normal limits  URINALYSIS, ROUTINE W REFLEX MICROSCOPIC - Abnormal; Notable for the following components:   APPearance HAZY (*)    Hgb urine dipstick LARGE (*)    Ketones, ur 5 (*)    Protein, ur 30 (*)    Leukocytes,Ua LARGE (*)    Bacteria, UA FEW (*)    All other components within normal  limits  URINE CULTURE  HCG, QUANTITATIVE, PREGNANCY  POCT PREGNANCY, URINE  TYPE AND SCREEN    EKG None  Radiology CT ABDOMEN PELVIS W CONTRAST  Result Date: 04/23/2023 CLINICAL DATA:  Acute abdominal pain EXAM: CT ABDOMEN AND PELVIS WITH CONTRAST TECHNIQUE: Multidetector CT imaging of the abdomen and pelvis was performed using the standard protocol following bolus administration of intravenous contrast. RADIATION DOSE REDUCTION: This exam was performed according to the departmental dose-optimization program which includes automated exposure control, adjustment of the mA and/or kV according to patient size and/or use of iterative reconstruction technique. CONTRAST:  75mL OMNIPAQUE IOHEXOL 350 MG/ML SOLN COMPARISON:  12/13/2022 FINDINGS: Lower chest: No acute abnormality. Hepatobiliary: No focal liver abnormality is seen. Status post cholecystectomy. No biliary dilatation. Pancreas: Unremarkable. No pancreatic ductal dilatation or surrounding inflammatory changes. Spleen: Normal in size without focal abnormality. Adrenals/Urinary Tract: Adrenal glands are within normal limits. Kidneys are well visualized bilaterally. Tiny nonobstructing lower pole stone on the right is noted stable from the prior exam. Ureters are within normal limits. Bladder is partially distended. Stomach/Bowel: The appendix is within normal limits. No obstructive or inflammatory changes of the colon are seen. Mild diverticular changes noted. Small bowel and stomach are within normal limits. Vascular/Lymphatic: Aortic atherosclerosis. No enlarged abdominal or pelvic lymph nodes.  Reproductive: Uterus and bilateral adnexa are unremarkable. Other: Fat containing umbilical hernia is noted. Musculoskeletal: No acute or significant osseous findings. IMPRESSION: Nonobstructing right renal stone. Diverticulosis without diverticulitis. Fat containing umbilical hernia. This is stable from the prior exam. Electronically Signed   By: Alcide Clever M.D.   On: 04/23/2023 22:56    Procedures Procedures    Medications Ordered in ED Medications  lactated ringers bolus 1,000 mL (0 mLs Intravenous Stopped 04/23/23 2041)  HYDROmorphone (DILAUDID) injection 1 mg (1 mg Intravenous Given 04/23/23 1915)  iohexol (OMNIPAQUE) 350 MG/ML injection 75 mL (75 mLs Intravenous Contrast Given 04/23/23 2056)    ED Course/ Medical Decision Making/ A&P Clinical Course as of 04/23/23 2318  Mon Apr 23, 2023  1857 RLQ pain, started Saturday. Began with cycle, currently cycle is finishing, but pain continues. Hx of IBS. No blood in stool or urine. Denies fever, chills. Very tender in RLQ. Decreased appetite.  [CP]    Clinical Course User Index [CP] Olene Floss, PA-C                                 Medical Decision Making Amount and/or Complexity of Data Reviewed Radiology: ordered.  Risk Prescription drug management.   This patient is a 47 y.o. female  who presents to the ED for concern of abdominal pain, .   Differential diagnoses prior to evaluation: The emergent differential diagnosis includes, but is not limited to,  The causes of generalized abdominal pain include but are not limited to AAA, mesenteric ischemia, appendicitis, diverticulitis, DKA, gastritis, gastroenteritis, AMI, nephrolithiasis, pancreatitis, peritonitis, adrenal insufficiency,lead poisoning, iron toxicity, intestinal ischemia, constipation, UTI,SBO/LBO, splenic rupture, biliary disease, IBD, IBS, PUD, or hepatitis.  Ectopic pregnancy, ovarian torsion, PID. Marland Kitchen This is not an exhaustive differential.   Past Medical  History / Co-morbidities / Social History: IBS, diabetes, hypertension, bipolar disorder, previous psychosis  Additional history: Chart reviewed. Pertinent results include: Reviewed outpatient family medicine as well as internal medicine visits, hospital admission for hyperglycemia a few months ago  Physical Exam: Physical exam performed. The pertinent findings include: Patient with some tenderness palpation in  the right lower quadrant with some mild guarding, no rebound, rigidity, on repeat evaluation after she has been in the emergency department for several hours she has significant improvement of tenderness  Lab Tests/Imaging studies: I personally interpreted labs/imaging and the pertinent results include: CBC with mild leukocytosis, white blood cells 13.4.  Her CMP is notable for mild hyponatremia, sodium 132 in context of glucose 240.  Beta-hCG negative, her UA is notable for large hemoglobin, some ketones and protein, she does have some large leukocytes but with squamous epithelial contamination and no dysuria, as well as a large amount of red blood cells as she is currently on her cycle, I have low clinical suspicion for acute UTI at this time. I independently interpreted CT abdomen pelvis w contrast which shows  Nonobstructing right renal stone.    Diverticulosis without diverticulitis.    Fat containing umbilical hernia. This is stable from the prior exam.  . I agree with the radiologist interpretation.   Medications: I ordered medication including Dilaudid x 1 for pain, fluid bolus.  I have reviewed the patients home medicines and have made adjustments as needed.   Disposition: After consideration of the diagnostic results and the patients response to treatment, I feel that patient with no evidence of acute intra-abdominal abnormality, no acute infectious or surgical pathology.  Encourage close follow-up with PCP/OB/GYN, if she develops dysuria may need treatment for acute UTI, we  will send a urine culture as well..   emergency department workup does not suggest an emergent condition requiring admission or immediate intervention beyond what has been performed at this time. The plan is: as above, follow-up with OB/GYN or return to the emergency department if worsening abdominal pain, no evidence of acute intra-abdominal pathology at this time, pain resolved on reevaluation. The patient is safe for discharge and has been instructed to return immediately for worsening symptoms, change in symptoms or any other concerns.  Final Clinical Impression(s) / ED Diagnoses Final diagnoses:  Right lower quadrant abdominal pain    Rx / DC Orders ED Discharge Orders     None         [place] 04/23/23 2318    Tegeler, Canary Brim, MD 04/23/23 [phone]

## 2023-04-23 NOTE — MAU Note (Signed)
Call to ED charge rn that provider here had spoken to ED provider there and pt is coming to main ed. Not pregnant with bleeding and pain.

## 2023-04-23 NOTE — Discharge Instructions (Signed)
Please use Tylenol or ibuprofen for pain.  You may use 600 mg ibuprofen every 6 hours or 1000 mg of Tylenol every 6 hours.  You may choose to alternate between the 2.  This would be most effective.  Not to exceed 4 g of Tylenol within 24 hours.  Not to exceed 3200 mg ibuprofen 24 hours.  Please follow-up with your OB/GYN or return to the emergency department if you are concerned for worsening abdominal pain.

## 2023-04-23 NOTE — MAU Note (Signed)
.  [name] is a 47 y.o. at Unknown here in MAU reporting: She started her period last week and still having bleeding and right lower abdominal pain that feels like a sharp pain (10/10). She does not think that she is pregnant - had a tubal many years ago. It's been off and on since then. Denies pregnancy. She also reports nausea, but unable to throw up.    Dr. Crissie Reese called to triage and at bedside at 1653.   LMP: Last week Onset of complaint: Last week Pain score: 10/10 Vitals:   04/23/23 1658  BP: (!) 169/100  Pulse: 97  Resp: (!) 26  Temp: 98 F (36.7 C)  SpO2: 100%     FHT:N/A Lab orders placed from triage:  Dr. Crissie Reese will put in orders

## 2023-04-23 NOTE — ED Notes (Signed)
Patient back from CT.

## 2023-04-24 ENCOUNTER — Other Ambulatory Visit: Payer: Self-pay | Admitting: Family Medicine

## 2023-04-24 NOTE — Telephone Encounter (Signed)
Medication Refill - Medication: metoCLOPramide (REGLAN) 10 MG tablet   Has the patient contacted their pharmacy? No.   Preferred Pharmacy (with phone number or street name):  [id number] [place] Otto, [place] - [address] AT [facility] OF [address] Phone: [phone]  Fax: [phone]      Has the patient been seen for an appointment in the last year OR does the patient have an upcoming appointment? Yes.    The patient states the medicine she has currently has expired. Please assist patient further

## 2023-04-25 LAB — URINE CULTURE

## 2023-04-25 MED ORDER — METOCLOPRAMIDE HCL 10 MG PO TABS: 10.0000 mg | ORAL_TABLET | Freq: Three times a day (TID) | ORAL | 2 refills | Status: DC

## 2023-04-25 NOTE — Telephone Encounter (Signed)
Requested medication (s) are due for refill today: yes  Requested medication (s) are on the active medication list: yes  Last refill:  12/21/22 #90  RF  Future visit scheduled: no  Notes to clinic:  med not delegated to NT to RF   Requested Prescriptions  Pending Prescriptions Disp Refills   metoCLOPramide (REGLAN) 10 MG tablet 90 tablet 2    Sig: Take 1 tablet (10 mg total) by mouth 3 (three) times daily before meals.     Not Delegated - Gastroenterology: Antiemetics - metoclopramide Failed - 04/24/2023 12:57 PM      Failed - This refill cannot be delegated      Passed - Cr in normal range and within 360 days    Creatinine, Ser  Date Value Ref Range Status  04/23/2023 0.67 0.44 - 1.00 mg/dL Final         Passed - Valid encounter within last 6 months    Recent Outpatient Visits           4 months ago Bipolar affective disorder, remission status unspecified (HCC)   [place] [facility], MD   4 months ago Hospital discharge follow-up   [facility], [place], NP   1 year ago Dependent edema   [place] [facility], Amy J, NP   2 years ago Type 2 diabetes mellitus with diabetic polyneuropathy, without long-term current use of insulin [facility])   [place] [facility], Amy J, NP   2 years ago Type 2 diabetes mellitus with diabetic polyneuropathy, without long-term current use of insulin [facility])   [place] [facility], Salomon Fick, NP

## 2023-05-01 ENCOUNTER — Other Ambulatory Visit: Payer: Self-pay | Admitting: Family Medicine

## 2023-05-01 NOTE — Telephone Encounter (Signed)
Medication Refill - Medication: insulin isophane & regular human KwikPen (NOVOLIN 70/30 KWIKPEN) (70-30) 100 UNIT/ML KwikPen and lisinopril (ZESTRIL) 40 MG tablet   Has the patient contacted their pharmacy? No.  Preferred Pharmacy (with phone number or street name):  [facility] #[id number] [place] Otto, [place] - [address] AT [facility] OF [address] Phone: [phone]  Fax: [phone]     Has the patient been seen for an appointment in the last year OR does the patient have an upcoming appointment? Yes.    Agent: Please be advised that RX refills may take up to 3 business days. We ask that you follow-up with your pharmacy.

## 2023-05-02 MED ORDER — LISINOPRIL 40 MG PO TABS: 40.0000 mg | ORAL_TABLET | Freq: Every day | ORAL | 0 refills | Status: DC

## 2023-05-02 MED ORDER — NOVOLIN 70/30 FLEXPEN RELION (70-30) 100 UNIT/ML ~~LOC~~ SUPN: 50.0000 [IU] | PEN_INJECTOR | Freq: Two times a day (BID) | SUBCUTANEOUS | 0 refills | Status: DC

## 2023-05-02 NOTE — Telephone Encounter (Signed)
Labs in date.  Requested Prescriptions  Pending Prescriptions Disp Refills   insulin isophane & regular human KwikPen (NOVOLIN 70/30 KWIKPEN) (70-30) 100 UNIT/ML KwikPen 90 mL 0    Sig: Inject 50 Units into the skin 2 (two) times daily with a meal.     Endocrinology:  Diabetes - Insulins Failed - 05/01/2023  4:05 PM      Failed - HBA1C is between 0 and 7.9 and within 180 days    Hgb A1c MFr Bld  Date Value Ref Range Status  11/08/2022 11.5 (H) 4.8 - 5.6 % Final    Comment:    (NOTE) Pre diabetes:          5.7%-6.4%  Diabetes:              >6.4%  Glycemic control for   <7.0% adults with diabetes          Passed - Valid encounter within last 6 months    Recent Outpatient Visits           4 months ago Bipolar affective disorder, remission status unspecified (HCC)   [place] [facility], MD   4 months ago Hospital discharge follow-up   [facility], [place] J, NP   1 year ago Dependent edema   [place] [facility], Amy J, NP   2 years ago Type 2 diabetes mellitus with diabetic polyneuropathy, without long-term current use of insulin (HCC)   [place] [facility], Amy J, NP   2 years ago Type 2 diabetes mellitus with diabetic polyneuropathy, without long-term current use of insulin (HCC)   [place] [facility], Amy J, NP               lisinopril (ZESTRIL) 40 MG tablet 90 tablet 0    Sig: Take 1 tablet (40 mg total) by mouth daily.     Cardiovascular:  ACE Inhibitors Failed - 05/01/2023  4:05 PM      Failed - Last BP in normal range    BP Readings from Last 1 Encounters:  04/23/23 (!) 155/90         Passed - Cr in normal range and within 180 days    Creatinine, Ser  Date Value Ref Range Status  04/23/2023 0.67 0.44 - 1.00 mg/dL Final         Passed - K in normal range and within 180 days    Potassium   Date Value Ref Range Status  04/23/2023 4.0 3.5 - 5.1 mmol/L Final         Passed - Patient is not pregnant      Passed - Valid encounter within last 6 months    Recent Outpatient Visits           4 months ago Bipolar affective disorder, remission status unspecified (HCC)   [place] [facility], MD   4 months ago Hospital discharge follow-up   [facility], [place], NP   1 year ago Dependent edema   [place] [facility], Amy J, NP   2 years ago Type 2 diabetes mellitus with diabetic polyneuropathy, without long-term current use of insulin [facility])   [place] [facility] at [facility], [place] J, NP   2 years ago Type 2 diabetes  mellitus with diabetic polyneuropathy, without long-term current use of insulin [facility])   [place] [facility], Salomon Fick, NP

## 2023-05-08 ENCOUNTER — Telehealth: Payer: Self-pay | Admitting: Family Medicine

## 2023-05-08 NOTE — Telephone Encounter (Signed)
Copied from CRM [id number]. Topic: General - Other >> May 08, 2023 11:02 AM Everette C wrote: Reason for CRM: The patient has called to follow up on previous requests for refills of their  insulin isophane & regular human KwikPen (NOVOLIN 70/30 KWIKPEN) (70-30) 100 UNIT/ML KwikPen and lisinopril (ZESTRIL) 40 MG tablet   Please contact the patient further when possible to discuss the availability of their refills

## 2023-05-09 ENCOUNTER — Telehealth: Payer: Self-pay | Admitting: Family Medicine

## 2023-05-09 NOTE — Telephone Encounter (Signed)
Called pt per CMA Jessie Foot to schedule appt for med refill; pt stated she will call to schedule when she is ready

## 2023-05-10 NOTE — Telephone Encounter (Signed)
I have attempted without success to contact this patient by phone to return their call. Was not able to leave VM .

## 2023-05-18 NOTE — Telephone Encounter (Signed)
error 

## 2023-07-16 ENCOUNTER — Ambulatory Visit: Payer: Self-pay | Admitting: *Deleted

## 2023-07-16 ENCOUNTER — Ambulatory Visit
Admission: EM | Admit: 2023-07-16 | Discharge: 2023-07-16 | Disposition: A | Discharge status: Home / Self Care | Payer: 59 | Attending: Family Medicine | Admitting: Family Medicine

## 2023-07-16 DIAGNOSIS — B9689 Other specified bacterial agents as the cause of diseases classified elsewhere: Secondary | ICD-10-CM

## 2023-07-16 DIAGNOSIS — E1165 Type 2 diabetes mellitus with hyperglycemia: Secondary | ICD-10-CM

## 2023-07-16 DIAGNOSIS — H109 Unspecified conjunctivitis: Secondary | ICD-10-CM | POA: Diagnosis not present

## 2023-07-16 DIAGNOSIS — H1013 Acute atopic conjunctivitis, bilateral: Secondary | ICD-10-CM

## 2023-07-16 MED ORDER — OLOPATADINE HCL 0.2 % OP SOLN: 1.0000 [drp] | Freq: Every day | OPHTHALMIC | 0 refills | Status: DC | PRN

## 2023-07-16 MED ORDER — TOBRAMYCIN 0.3 % OP SOLN: 1.0000 [drp] | OPHTHALMIC | 0 refills | Status: DC

## 2023-07-16 MED ORDER — HYDROXYZINE HCL 25 MG PO TABS: 12.5000 mg | ORAL_TABLET | Freq: Three times a day (TID) | ORAL | 0 refills | Status: DC | PRN

## 2023-07-16 NOTE — ED Provider Notes (Signed)
 [facility]  Note:  This document was prepared using Conservation officer, historic buildings and may include unintentional dictation errors.  MRN: [id number] DOB: 01-10-1976  Subjective:   Elmina Hendel is a 48 y.o. female presenting for 3-week history of acute onset persistent bilateral eye irritation, eye redness and drainage, itching.  Reports longstanding history of persistent itching.  Has previously taken hydroxyzine  but does not have this now.  No new medications.  No new eye products.  No known new exposures.  No eye trauma.  No vision changes.  Patient is an uncontrolled type II diabetic on insulin .  Cannot recall history of eczema.  No current facility-administered medications for this encounter.  Current Outpatient Medications:    acetaminophen  (TYLENOL ) 325 MG tablet, Take by mouth., Disp: , Rfl:    amLODipine  (NORVASC ) 10 MG tablet, Take 1 tablet (10 mg total) by mouth daily., Disp: 90 tablet, Rfl: 1   ARIPiprazole  (ABILIFY ) 15 MG tablet, Take by mouth., Disp: , Rfl:    atorvastatin  (LIPITOR ) 80 MG tablet, Take 1 tablet (80 mg total) by mouth daily. Elevated cholesterol, Disp: 90 tablet, Rfl: 1   benztropine  (COGENTIN ) 1 MG tablet, Take 1 tablet (1 mg total) by mouth 2 (two) times daily., Disp: 60 tablet, Rfl: 0   Blood Glucose Monitoring Suppl (BLOOD GLUCOSE MONITOR SYSTEM) w/Device KIT, Use 3 (three) times daily., Disp: 1 kit, Rfl: 0   docusate sodium  (COLACE) 100 MG capsule, Take 1 capsule (100 mg total) by mouth 2 (two) times daily., Disp: 10 capsule, Rfl: 0   ferrous sulfate  325 (65 FE) MG tablet, Take 1 tablet (325 mg total) by mouth daily with breakfast., Disp: 90 tablet, Rfl: 0   Glucose Blood (BLOOD GLUCOSE TEST STRIPS) STRP, Use as directed to check blood sugar three times daily, Disp: 100 strip, Rfl: 0   haloperidol  (HALDOL ) 0.5 MG tablet, Take 1 tablet (0.5 mg total) by mouth 2 (two) times daily as needed for agitation., Disp: 60 tablet, Rfl: 0    hydrOXYzine  (ATARAX ) 25 MG tablet, Take 1 tablet (25 mg total) by mouth 3 (three) times daily as needed for anxiety. For anxiety, Disp: 90 tablet, Rfl: 1   insulin  isophane & regular human KwikPen (NOVOLIN  70/30 KWIKPEN) (70-30) 100 UNIT/ML KwikPen, Inject 50 Units into the skin 2 (two) times daily with a meal., Disp: 90 mL, Rfl: 0   insulin  lispro (HUMALOG ) 100 UNIT/ML injection, Inject into the skin., Disp: , Rfl:    Insulin  Lispro Prot & Lispro (HUMALOG  MIX 75/25 KWIKPEN) (75-25) 100 UNIT/ML Kwikpen, Inject 45 Units into the skin 2 (two) times daily., Disp: 15 mL, Rfl: 3   Insulin  Pen Needle (PEN NEEDLES) 31G X 5 MM MISC, Use with insulin  3 (three) times daily., Disp: 100 each, Rfl: 3   Lancet Device MISC, 1 each by Does not apply route 3 (three) times daily. May dispense any manufacturer covered by patient's insurance., Disp: 1 each, Rfl: 3   Lancets MISC, Use as directed to check blood sugar 3 times daily., Disp: 100 each, Rfl: 0   lisinopril  (ZESTRIL ) 40 MG tablet, Take 1 tablet (40 mg total) by mouth daily., Disp: 90 tablet, Rfl: 0   metoCLOPramide  (REGLAN ) 10 MG tablet, Take 1 tablet (10 mg total) by mouth 3 (three) times daily before meals., Disp: 90 tablet, Rfl: 2   metoprolol  tartrate (LOPRESSOR ) 50 MG tablet, Take 1 tablet (50 mg total) by mouth 2 (two) times daily., Disp: 180 tablet, Rfl: 1  PARoxetine  (PAXIL ) 20 MG tablet, Take 1 tablet (20 mg total) by mouth daily., Disp: 30 tablet, Rfl: 0   senna-docusate (SENOKOT-S) 8.6-50 MG tablet, Take 2 tablets by mouth 2 (two) times daily as needed for moderate constipation. (Patient not taking: Reported on 12/13/2022), Disp: 60 tablet, Rfl: 0   traZODone  (DESYREL ) 50 MG tablet, Take 1 tablet (50 mg total) by mouth at bedtime as needed for sleep. For sleep, Disp: 90 tablet, Rfl: 1   No Known Allergies  Past Medical History:  Diagnosis Date   Abnormal Pap smear    Bipolar 1 disorder (HCC)    Diabetes mellitus without complication (HCC)     Fibroids    Hypertension    IBS (irritable bowel syndrome)      Past Surgical History:  Procedure Laterality Date   CERVICAL BIOPSY     CHOLECYSTECTOMY     ESSURE TUBAL LIGATION     HIATAL HERNIA REPAIR     TUBAL LIGATION      Family History  Problem Relation Age of Onset   Diabetes Father    Diabetes Paternal Grandmother    Diabetes Maternal Grandmother    Mental illness Cousin    Healthy Mother     Social History   Tobacco Use   Smoking status: Former    Types: Cigarettes   Smokeless tobacco: Never  Vaping Use   Vaping status: Some Days   Substances: Nicotine , Flavoring  Substance Use Topics   Alcohol use: Yes    Comment: occasional   Drug use: Not Currently    ROS   Objective:   Vitals: BP 138/82 (BP Location: Right Arm)   Pulse 88   Temp 98.8 F (37.1 C) (Oral)   Resp 20   LMP 07/12/2023   SpO2 97%   Physical Exam Constitutional:      General: She is not in acute distress.    Appearance: Normal appearance. She is well-developed. She is not ill-appearing, toxic-appearing or diaphoretic.  HENT:     Head: Normocephalic and atraumatic.     Nose: Nose normal.     Mouth/Throat:     Mouth: Mucous membranes are moist.  Eyes:     General: Lids are normal. Lids are everted, no foreign bodies appreciated. Vision grossly intact. No scleral icterus.       Right eye: No foreign body, discharge or hordeolum.        Left eye: No foreign body, discharge or hordeolum.     Extraocular Movements: Extraocular movements intact.     Right eye: Normal extraocular motion.     Left eye: Normal extraocular motion and no nystagmus.     Conjunctiva/sclera:     Right eye: Right conjunctiva is injected. No chemosis, exudate or hemorrhage.    Left eye: Left conjunctiva is injected (L>R; slight watering of the left eye). No chemosis, exudate or hemorrhage.  Cardiovascular:     Rate and Rhythm: Normal rate.  Pulmonary:     Effort: Pulmonary effort is normal.  Skin:     General: Skin is warm and dry.  Neurological:     General: No focal deficit present.     Mental Status: She is alert and oriented to person, place, and time.  Psychiatric:        Mood and Affect: Mood normal.        Behavior: Behavior normal.    Assessment and Plan :   PDMP not reviewed this encounter.  1. Bacterial conjunctivitis of both eyes  2. Allergic conjunctivitis of both eyes   3. Uncontrolled type 2 diabetes mellitus with hyperglycemia (HCC)    I do suspect an eczematous dermatitis of both upper and lower eyelids.  Unfortunately, patient is not able to take prednisone.  I recommended hydroxyzine , olopatadine  eyedrops and tobramycin  for secondary infection.  No signs of an ophthalmologic emergency.  Counseled patient on potential for adverse effects with medications prescribed/recommended today, ER and return-to-clinic precautions discussed, patient verbalized understanding.    Christopher Savannah, PA-C 07/16/23 1807

## 2023-07-16 NOTE — ED Triage Notes (Signed)
 Pt c/o bilat eye redness and drainage x 3 weeks-NAD-steady gait

## 2023-07-16 NOTE — Telephone Encounter (Signed)
 Called patient to review sx and patient reports she is going to UC and no triage completed Chief Complaint: eyes crusted over per agent notes and blurred vision Symptoms: see above Frequency: na Pertinent Negatives: Patient denies na Disposition: [] ED /[x] Urgent Care (no appt availability in office) / [] Appointment(In office/virtual)/ []  [facility]/ [] Home Care/ [] Refused Recommended Disposition /[] [facility]/ []  Follow-up with PCP Additional Notes:   Patient reports she is going to UC and no longer needs assistance. Offered patient address to mobile unit as well.    Summary: Crusted over eyes, Blurred vision   The patient states her eyes have been bothering her for weeks. She says they get crusted over and feels like they may have an infection. She says it has been infecting her vision as it is blurry. Please assist patient further.                 Reason for Disposition  Patient already left for the hospital/clinic.  Answer Assessment - Initial Assessment Questions N/A Patient reports she is going to go to UC for sx of eye crusted over and blurred vision. No triage completed  Protocols used: No Contact or Duplicate Contact Call-A-AH

## 2023-07-28 ENCOUNTER — Other Ambulatory Visit: Payer: Self-pay

## 2023-07-28 ENCOUNTER — Ambulatory Visit
Admission: EM | Admit: 2023-07-28 | Discharge: 2023-07-28 | Disposition: A | Discharge status: Home / Self Care | Payer: 59 | Attending: Family Medicine | Admitting: Family Medicine

## 2023-07-28 DIAGNOSIS — H1013 Acute atopic conjunctivitis, bilateral: Secondary | ICD-10-CM | POA: Diagnosis not present

## 2023-07-28 MED ORDER — CETIRIZINE HCL 10 MG PO TABS: 10.0000 mg | ORAL_TABLET | Freq: Every day | ORAL | 0 refills | Status: DC

## 2023-07-28 NOTE — ED Provider Notes (Signed)
[facility]    CSN: [id number] Arrival date & time: 07/28/23  1329      History   Chief Complaint Chief Complaint  Patient presents with   Eye Pain    HPI [name] is a 48 y.o. female presents for eye concern.  Patient was seen in urgent care on 1/13 and treated for both allergic and bacterial conjunctivitis with tobramycin eyedrops and olopatadine antihistamine eyedrops.  Patient is been using as prescribed.  Continues to report some itching and drainage.  Yesterday she saw a "blister" on her eye and called 911.  They came out to evaluate checked her eyes and said she should follow-up with ophthalmology.  She endorses itching and some pain but denies visual changes or injury to the eye.  Does not wear contacts or glasses.  No foreign body sensation.  No other concerns at this time.   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   Candida vaginitis 04/11/2021   Type 2 diabetes mellitus without complication, without long-term current use of insulin (HCC) 04/18/2020   Essential hypertension 04/08/2019   Class 2 severe obesity due to excess calories with serious comorbidity and body mass index (BMI) of 39.0 to 39.9 in adult (HCC) 04/08/2019   Nicotine abuse 04/08/2019   Bipolar disorder (HCC) 05/18/2018   Diabetes mellitus (HCC) 08/10/2016   Bipolar disorder, curr episode mixed, severe, with psychotic  features (HCC) 08/07/2016   Cannabis use disorder, moderate, dependence (HCC) 08/07/2016   CHOLECYSTITIS, UNSPECIFIED 06/12/2008   Calculus of gallbladder 06/10/2008   CERUMEN IMPACTION, BILATERAL 01/17/2008   PHARYNGITIS, VIRAL 01/17/2008   NECK PAIN 01/17/2008    Past Surgical History:  Procedure Laterality Date   CERVICAL BIOPSY     CHOLECYSTECTOMY     ESSURE TUBAL LIGATION     HIATAL HERNIA REPAIR     TUBAL LIGATION      OB History     Gravida  7   Para  2   Term  2   Preterm  0   AB  5   Living  2      SAB  0   IAB  4   Ectopic  1   Multiple      Live Births  2            Home Medications    Prior to Admission medications   Medication Sig Start Date End Date Taking? Authorizing Provider  cetirizine (ZYRTEC) 10 MG tablet Take 1 tablet (10 mg total) by mouth daily. 07/28/23  Yes Radford Pax, NP  acetaminophen (TYLENOL) 325 MG tablet Take by mouth. 02/04/22   [provider]  amLODipine (NORVASC) 10 MG tablet Take 1 tablet (10 mg total) by mouth daily. 12/21/22 04/08/23  Georganna Skeans, MD  ARIPiprazole (ABILIFY) 15 MG tablet Take by mouth. 02/04/22   [provider]  atorvastatin (LIPITOR) 80 MG tablet Take 1 tablet (80 mg total) by mouth daily. Elevated cholesterol 12/21/22 01/20/23  Georganna Skeans, MD  benztropine (COGENTIN) 1 MG tablet Take 1 tablet (1 mg total) by mouth 2 (two) times daily. 12/21/22   Georganna Skeans, MD  Blood Glucose Monitoring Suppl (BLOOD GLUCOSE MONITOR SYSTEM) w/Device KIT Use 3 (three) times daily. 11/13/22   Amin, Ankit C, MD  docusate sodium (COLACE) 100 MG capsule Take 1 capsule (100 mg total) by mouth 2 (two) times daily. 11/13/22   Amin, Ankit C, MD  ferrous sulfate 325 (65 FE) MG tablet Take 1 tablet (325 mg total) by mouth daily with breakfast. 02/16/23   Georganna Skeans, MD  Glucose Blood (BLOOD GLUCOSE TEST STRIPS) STRP Use as directed to check blood sugar three times daily 11/13/22   Amin, Ankit C, MD   haloperidol (HALDOL) 0.5 MG tablet Take 1 tablet (0.5 mg total) by mouth 2 (two) times daily as needed for agitation. 12/21/22   Georganna Skeans, MD  hydrOXYzine (ATARAX) 25 MG tablet Take 0.5-1 tablets (12.5-25 mg total) by mouth every 8 (eight) hours as needed for itching. 07/16/23   Wallis Bamberg, PA-C  insulin isophane & regular human KwikPen (NOVOLIN 70/30 KWIKPEN) (70-30) 100 UNIT/ML KwikPen Inject 50 Units into the skin 2 (two) times daily with a meal. 05/02/23 07/31/23  Georganna Skeans, MD  insulin lispro (HUMALOG) 100 UNIT/ML injection Inject into the skin. 11/17/22   [provider]  Insulin Lispro Prot & Lispro (HUMALOG MIX 75/25 KWIKPEN) (75-25) 100 UNIT/ML Kwikpen Inject 45 Units into the skin 2 (two) times daily. 03/09/23   Georganna Skeans, MD  Insulin Pen Needle (PEN NEEDLES) 31G X 5 MM MISC Use with insulin 3 (three) times daily. 12/21/22   Georganna Skeans, MD  Lancet Device MISC 1 each by Does not apply route 3 (three) times daily. May dispense any manufacturer covered by patient's insurance. 12/21/22   Georganna Skeans, MD  Lancets MISC Use as directed to check blood sugar 3 times daily. 11/13/22   Amin, Ankit C, MD  lisinopril (ZESTRIL) 40 MG tablet Take 1 tablet (40 mg total) by mouth daily. 05/02/23   Georganna Skeans, MD  metoCLOPramide (REGLAN) 10 MG tablet Take 1 tablet (10 mg total) by mouth 3 (three) times daily before meals. 04/25/23   Georganna Skeans, MD  metoprolol tartrate (LOPRESSOR) 50 MG tablet Take 1 tablet (50 mg total) by mouth 2 (two) times daily. 12/21/22 04/08/23  Georganna Skeans, MD  Olopatadine HCl 0.2 % SOLN Apply 1 drop to eye daily as needed. 07/16/23   Wallis Bamberg, PA-C  PARoxetine (PAXIL) 20 MG tablet Take 1 tablet (20 mg total) by mouth daily. 12/21/22   Georganna Skeans, MD  senna-docusate (SENOKOT-S) 8.6-50 MG tablet Take 2 tablets by mouth 2 (two) times daily as needed for moderate constipation. Patient not taking: Reported on 12/13/2022 11/13/22   [name], MD   tobramycin (TOBREX) 0.3 % ophthalmic solution Place 1 drop into both eyes every 4 (four) hours. 07/16/23   Wallis Bamberg, PA-C  traZODone (DESYREL) 50 MG tablet Take 1 tablet (50 mg total) by mouth at bedtime as needed for sleep. For sleep 12/21/22   Georganna Skeans, MD    Family History Family History  Problem Relation Age of Onset   Diabetes Father    Diabetes Paternal Grandmother    Diabetes Maternal Grandmother    Mental illness Cousin    Healthy Mother     Social History Social  History   Tobacco Use   Smoking status: Former    Types: Cigarettes   Smokeless tobacco: Never  Vaping Use   Vaping status: Some Days   Substances: Nicotine, Flavoring  Substance Use Topics   Alcohol use: Yes    Comment: occasional   Drug use: Not Currently     Allergies   Patient has no known allergies.   Review of Systems Review of Systems  Eyes:  Positive for discharge and itching.     Physical Exam Triage Vital Signs ED Triage Vitals  Encounter Vitals Group     BP 07/28/23 1443 (!) 166/82     Systolic BP Percentile --      Diastolic BP Percentile --      Pulse Rate 07/28/23 1443 84     Resp 07/28/23 1443 16     Temp 07/28/23 1443 98.1 F (36.7 C)     Temp Source 07/28/23 1443 Oral     SpO2 07/28/23 1443 96 %     Weight --      Height --      Head Circumference --      Peak Flow --      Pain Score 07/28/23 1445 10     Pain Loc --      Pain Education --      Exclude from Growth Chart --    No data found.  Updated Vital Signs BP (!) 166/82 (BP Location: Left Arm)   Pulse 84   Temp 98.1 F (36.7 C) (Oral)   Resp 16   LMP 07/12/2023   SpO2 96%   Visual Acuity Right Eye Distance:   Left Eye Distance:   Bilateral Distance:    Right Eye Near:   Left Eye Near:    Bilateral Near:     Physical Exam Vitals and nursing note reviewed.  Constitutional:      General: She is not in acute distress.    Appearance: Normal appearance. She is not ill-appearing.  HENT:      Head: Normocephalic and atraumatic.  Eyes:     General: Lids are normal.        Right eye: Discharge present. No hordeolum.        Left eye: Discharge present.No hordeolum.     Conjunctiva/sclera:     Right eye: Right conjunctiva is not injected. No chemosis, exudate or hemorrhage.    Left eye: Left conjunctiva is not injected. No chemosis, exudate or hemorrhage.    Pupils: Pupils are equal, round, and reactive to light.     Comments: Wound watery drainage noted  Cardiovascular:     Rate and Rhythm: Normal rate.  Pulmonary:     Effort: Pulmonary effort is normal.  Skin:    General: Skin is warm and dry.  Neurological:     General: No focal deficit present.     Mental Status: She is alert and oriented to person, place, and time.  Psychiatric:        Mood and Affect: Mood normal.        Behavior: Behavior normal.      UC Treatments / Results  Labs (all labs ordered are listed, but only abnormal results are displayed) Labs Reviewed - No data to display  EKG   Radiology No results found.  Procedures Procedures (including critical care time)  Medications Ordered in UC Medications - No data to display  Initial Impression / Assessment and Plan / UC Course  I have reviewed the  triage vital signs and the nursing notes.  Pertinent labs & imaging results that were available during my care of the patient were reviewed by me and considered in my medical decision making (see chart for details).     Reviewed exam and symptoms with patient.  No red flags.  No signs of chemosis on exam.  Exam does seem more consistent with allergic conjunctivitis.  Advised her to continue the antihistamine eyedrops and will start cetirizine daily.  Contact information for ophthalmology provided patient will contact them on Monday to make a follow-up appointment.  She was instructed to go to the ER for any worsening symptoms that occur prior to her seeing ophthalmology and she verbalized  understanding. Final Clinical Impressions(s) / UC Diagnoses   Final diagnoses:  Allergic conjunctivitis of both eyes     Discharge Instructions      Start cetirizine daily.  Continue the eyedrops as previously prescribed.  Follow-up with ophthalmology for further treatment of your symptoms.  Please go to the ER if you develop any worsening symptoms prior to seeing ophthalmology.  I hope you feel better soon!    ED Prescriptions     Medication Sig Dispense Auth. Provider   cetirizine (ZYRTEC) 10 MG tablet Take 1 tablet (10 mg total) by mouth daily. 30 tablet Radford Pax, NP      PDMP not reviewed this encounter.   Radford Pax, NP 07/28/23 1524

## 2023-07-28 NOTE — ED Triage Notes (Signed)
Patient states she is here for follow up for eye pain. Patient states she called 911 yesterday for a "clot in my eye" Patient is here today asking for a a referral to the eye specialist and a work note.

## 2023-07-28 NOTE — Discharge Instructions (Signed)
Start cetirizine daily.  Continue the eyedrops as previously prescribed.  Follow-up with ophthalmology for further treatment of your symptoms.  Please go to the ER if you develop any worsening symptoms prior to seeing ophthalmology.  I hope you feel better soon!

## 2023-08-06 NOTE — Telephone Encounter (Signed)
Patient is aware that she will need to wait for her appointment to get paper for FMLA filled out

## 2023-08-14 ENCOUNTER — Telehealth: Payer: Self-pay | Admitting: Family Medicine

## 2023-08-14 NOTE — Telephone Encounter (Signed)
Patient was identified as falling into the [place] Measure - Diabetes.   Patient was: Appointment scheduled for lab or office visit for A1c.  Patient has not had A1C check in the last 60days, Patient has appointment on 08/23/2023 so added A1c check to visit

## 2023-08-22 ENCOUNTER — Telehealth: Payer: Self-pay | Admitting: Family Medicine

## 2023-08-22 NOTE — Telephone Encounter (Signed)
Called patient to inform her that her appointment would be pushed back due to inclement weather and we will be on 2 hour delay tomorrow with a possibility to close.If patient calls [id number] can inform patient her appointment is now at 10:40am.

## 2023-08-23 ENCOUNTER — Ambulatory Visit: Payer: 59 | Admitting: Family Medicine

## 2023-08-23 ENCOUNTER — Ambulatory Visit (INDEPENDENT_AMBULATORY_CARE_PROVIDER_SITE_OTHER): Payer: 59 | Admitting: Family Medicine

## 2023-08-23 ENCOUNTER — Encounter: Payer: Self-pay | Admitting: Family Medicine

## 2023-08-23 VITALS — BP 125/87 | HR 86 | Temp 98.2°F | Resp 16 | Ht 69.5 in | Wt 199.8 lb

## 2023-08-23 DIAGNOSIS — E1169 Type 2 diabetes mellitus with other specified complication: Secondary | ICD-10-CM

## 2023-08-23 DIAGNOSIS — Z794 Long term (current) use of insulin: Secondary | ICD-10-CM | POA: Diagnosis not present

## 2023-08-23 DIAGNOSIS — E785 Hyperlipidemia, unspecified: Secondary | ICD-10-CM

## 2023-08-23 DIAGNOSIS — I10 Essential (primary) hypertension: Secondary | ICD-10-CM

## 2023-08-23 DIAGNOSIS — E1165 Type 2 diabetes mellitus with hyperglycemia: Secondary | ICD-10-CM

## 2023-08-23 LAB — POCT GLYCOSYLATED HEMOGLOBIN (HGB A1C): Hemoglobin A1C: 10.9 % — AB (ref 4.0–5.6)

## 2023-08-28 ENCOUNTER — Encounter: Payer: Self-pay | Admitting: Family Medicine

## 2023-08-28 NOTE — Progress Notes (Signed)
 Established Patient Office Visit  Subjective    Patient ID: [name], female    DOB: [birth year]  Age: 48 y.o. MRN: [id number]  CC:  Chief Complaint  Patient presents with   fmla paperwork    Medication refill, .trouble sleeping    HPI [name] presents for routine follow up of chronic med issues including diabetes and hypertension. Patient denies acute complaints.   Outpatient Encounter Medications as of 08/23/2023  Medication Sig   acetaminophen (TYLENOL) 325 MG tablet Take by mouth.   ARIPiprazole (ABILIFY) 15 MG tablet Take by mouth.   benztropine (COGENTIN) 1 MG tablet Take 1 tablet (1 mg total) by mouth 2 (two) times daily.   Blood Glucose Monitoring Suppl (BLOOD GLUCOSE MONITOR SYSTEM) w/Device KIT Use 3 (three) times daily.   cetirizine (ZYRTEC) 10 MG tablet Take 1 tablet (10 mg total) by mouth daily.   docusate sodium (COLACE) 100 MG capsule Take 1 capsule (100 mg total) by mouth 2 (two) times daily.   ferrous sulfate 325 (65 FE) MG tablet Take 1 tablet (325 mg total) by mouth daily with breakfast.   Glucose Blood (BLOOD GLUCOSE TEST STRIPS) STRP Use as directed to check blood sugar three times daily   haloperidol (HALDOL) 0.5 MG tablet Take 1 tablet (0.5 mg total) by mouth 2 (two) times daily as needed for agitation.   hydrOXYzine (ATARAX) 25 MG tablet Take 0.5-1 tablets (12.5-25 mg total) by mouth every 8 (eight) hours as needed for itching.   insulin lispro (HUMALOG) 100 UNIT/ML injection Inject into the skin.   Insulin Lispro Prot & Lispro (HUMALOG MIX 75/25 KWIKPEN) (75-25) 100 UNIT/ML Kwikpen Inject 45 Units into the skin 2 (two) times daily.   Insulin Pen Needle (PEN NEEDLES) 31G X 5 MM MISC Use with insulin 3 (three) times daily.   Lancet Device MISC 1 each by Does not apply route 3 (three) times daily. May dispense any manufacturer covered by patient's insurance.   Lancets MISC Use as directed to check blood sugar 3 times daily.   lisinopril (ZESTRIL) 40 MG  tablet Take 1 tablet (40 mg total) by mouth daily.   metoCLOPramide (REGLAN) 10 MG tablet Take 1 tablet (10 mg total) by mouth 3 (three) times daily before meals.   Olopatadine HCl 0.2 % SOLN Apply 1 drop to eye daily as needed.   PARoxetine (PAXIL) 20 MG tablet Take 1 tablet (20 mg total) by mouth daily.   senna-docusate (SENOKOT-S) 8.6-50 MG tablet Take 2 tablets by mouth 2 (two) times daily as needed for moderate constipation.   tobramycin (TOBREX) 0.3 % ophthalmic solution Place 1 drop into both eyes every 4 (four) hours.   traZODone (DESYREL) 50 MG tablet Take 1 tablet (50 mg total) by mouth at bedtime as needed for sleep. For sleep   amLODipine (NORVASC) 10 MG tablet Take 1 tablet (10 mg total) by mouth daily.   atorvastatin (LIPITOR) 80 MG tablet Take 1 tablet (80 mg total) by mouth daily. Elevated cholesterol   insulin isophane & regular human KwikPen (NOVOLIN 70/30 KWIKPEN) (70-30) 100 UNIT/ML KwikPen Inject 50 Units into the skin 2 (two) times daily with a meal.   metoprolol tartrate (LOPRESSOR) 50 MG tablet Take 1 tablet (50 mg total) by mouth 2 (two) times daily.   No facility-administered encounter medications on file as of 08/23/2023.    Past Medical History:  Diagnosis Date   Abnormal Pap smear    Bipolar 1 disorder (HCC)    Diabetes  mellitus without complication (HCC)    Fibroids    Hypertension    IBS (irritable bowel syndrome)     Past Surgical History:  Procedure Laterality Date   CERVICAL BIOPSY     CHOLECYSTECTOMY     ESSURE TUBAL LIGATION     HIATAL HERNIA REPAIR     TUBAL LIGATION      Family History  Problem Relation Age of Onset   Diabetes Father    Diabetes Paternal Grandmother    Diabetes Maternal Grandmother    Mental illness Cousin    Healthy Mother     Social History   Socioeconomic History   Marital status: Single    Spouse name: Not on file   Number of children: 2   Years of education: Not on file   Highest education level: Master's  degree (e.g., MA, MS, MEng, MEd, MSW, MBA)  Occupational History   Not on file  Tobacco Use   Smoking status: Former    Types: Cigarettes   Smokeless tobacco: Never  Vaping Use   Vaping status: Some Days   Substances: Nicotine, Flavoring  Substance and Sexual Activity   Alcohol use: Yes    Comment: occasional   Drug use: Not Currently   Sexual activity: Yes    Birth control/protection: Surgical  Other Topics Concern   Not on file  Social History Narrative   ** Merged History Encounter **       Social Drivers of Health   Financial Resource Strain: Medium Risk (08/22/2023)   Overall Financial Resource Strain (CARDIA)    Difficulty of Paying Living Expenses: Somewhat hard  Food Insecurity: Food Insecurity Present (08/22/2023)   Hunger Vital Sign    Worried About Running Out of Food in the Last Year: Sometimes true    Ran Out of Food in the Last Year: Sometimes true  Transportation Needs: Unmet Transportation Needs (08/22/2023)   PRAPARE - Transportation    Lack of Transportation (Medical): Yes    Lack of Transportation (Non-Medical): Yes  Physical Activity: Insufficiently Active (08/22/2023)   Exercise Vital Sign    Days of Exercise per Week: 3 days    Minutes of Exercise per Session: 10 min  Stress: Stress Concern Present (08/22/2023)   [employer] of Occupational Health - Occupational Stress Questionnaire    Feeling of Stress : Very much  Social Connections: Moderately Integrated (08/22/2023)   Social Connection and Isolation Panel [NHANES]    Frequency of Communication with Friends and Family: More than three times a week    Frequency of Social Gatherings with Friends and Family: Twice a week    Attends Religious Services: 1 to 4 times per year    Active Member of [employer] or Organizations: Yes    Attends Banker Meetings: 1 to 4 times per year    Marital Status: Divorced  Intimate Partner Violence: Not At Risk (12/13/2022)   Humiliation, Afraid, Rape, and  Kick questionnaire    Fear of Current or Ex-Partner: No    Emotionally Abused: No    Physically Abused: No    Sexually Abused: No    Review of Systems  All other systems reviewed and are negative.       Objective    BP 125/87 (BP Location: Right Arm, Patient Position: Sitting, Cuff Size: Large)   Pulse 86   Temp 98.2 F (36.8 C) (Oral)   Resp 16   Ht 5' 9.5" (1.765 m)   Wt 199 lb 12.8 oz (90.6  kg)   SpO2 95%   BMI 29.08 kg/m   Physical Exam Vitals and nursing note reviewed.  Constitutional:      General: She is not in acute distress. Cardiovascular:     Rate and Rhythm: Normal rate and regular rhythm.  Pulmonary:     Effort: Pulmonary effort is normal.     Breath sounds: Normal breath sounds.  Neurological:     General: No focal deficit present.     Mental Status: She is alert and oriented to person, place, and time.         Assessment & Plan:   1. Type 2 diabetes mellitus with other specified complication, with long-term current use of insulin (HCC) (Primary) A1c slightly Improved but still far above goal. Referred to [facility] for med management.  - HgB A1c  2.Encounter for long-term (current) use of insulin (HCC)   3. Essential hypertension Appears stable. continue  4. Hyperlipidemia, unspecified hyperlipidemia type Continue     Return in about 3 months (around 11/20/2023) for follow up, chronic med issues.   Tommie Raymond, MD

## 2023-09-03 ENCOUNTER — Encounter: Payer: Self-pay | Admitting: Family Medicine

## 2023-09-03 ENCOUNTER — Ambulatory Visit (INDEPENDENT_AMBULATORY_CARE_PROVIDER_SITE_OTHER): Payer: MEDICAID | Admitting: Family Medicine

## 2023-09-03 VITALS — BP 136/77 | HR 90 | Temp 98.2°F | Resp 16 | Ht 72.0 in | Wt 291.8 lb

## 2023-09-03 DIAGNOSIS — Z0289 Encounter for other administrative examinations: Secondary | ICD-10-CM | POA: Diagnosis not present

## 2023-09-03 DIAGNOSIS — H10503 Unspecified blepharoconjunctivitis, bilateral: Secondary | ICD-10-CM

## 2023-09-03 MED ORDER — TOBRAMYCIN 0.3 % OP SOLN: 1.0000 [drp] | OPHTHALMIC | 0 refills | Status: DC

## 2023-09-04 ENCOUNTER — Encounter: Payer: Self-pay | Admitting: Family Medicine

## 2023-09-04 NOTE — Progress Notes (Signed)
 Established Patient Office Visit  Subjective    Patient ID: [name], female    DOB: Jan 25, 1976  Age: 48 y.o. MRN: [id number]  CC:  Chief Complaint  Patient presents with   paperwork    Eyes are infected    HPI [name] presents for complaint of eye pain and redness and for completion of paperwork for employment. Patient reprots that her eyes are red, and itchy and painful and that she requires extra break time to clear her eyes to continue her work. She also would like to be referred for her eye sx. She has also had a visit at [facility] for which she was given tobramycin and she reports that improved sx.   Outpatient Encounter Medications as of 09/03/2023  Medication Sig   acetaminophen (TYLENOL) 325 MG tablet Take by mouth.   ARIPiprazole (ABILIFY) 15 MG tablet Take by mouth.   benztropine (COGENTIN) 1 MG tablet Take 1 tablet (1 mg total) by mouth 2 (two) times daily.   Blood Glucose Monitoring Suppl (BLOOD GLUCOSE MONITOR SYSTEM) w/Device KIT Use 3 (three) times daily.   cetirizine (ZYRTEC) 10 MG tablet Take 1 tablet (10 mg total) by mouth daily.   docusate sodium (COLACE) 100 MG capsule Take 1 capsule (100 mg total) by mouth 2 (two) times daily.   ferrous sulfate 325 (65 FE) MG tablet Take 1 tablet (325 mg total) by mouth daily with breakfast.   Glucose Blood (BLOOD GLUCOSE TEST STRIPS) STRP Use as directed to check blood sugar three times daily   haloperidol (HALDOL) 0.5 MG tablet Take 1 tablet (0.5 mg total) by mouth 2 (two) times daily as needed for agitation.   hydrOXYzine (ATARAX) 25 MG tablet Take 0.5-1 tablets (12.5-25 mg total) by mouth every 8 (eight) hours as needed for itching.   insulin lispro (HUMALOG) 100 UNIT/ML injection Inject into the skin.   Insulin Lispro Prot & Lispro (HUMALOG MIX 75/25 KWIKPEN) (75-25) 100 UNIT/ML Kwikpen Inject 45 Units into the skin 2 (two) times daily.   Insulin Pen Needle (PEN NEEDLES) 31G X 5 MM MISC Use with insulin 3 (three) times daily.    Lancet Device MISC 1 each by Does not apply route 3 (three) times daily. May dispense any manufacturer covered by patient's insurance.   Lancets MISC Use as directed to check blood sugar 3 times daily.   lisinopril (ZESTRIL) 40 MG tablet Take 1 tablet (40 mg total) by mouth daily.   metoCLOPramide (REGLAN) 10 MG tablet Take 1 tablet (10 mg total) by mouth 3 (three) times daily before meals.   Olopatadine HCl 0.2 % SOLN Apply 1 drop to eye daily as needed.   PARoxetine (PAXIL) 20 MG tablet Take 1 tablet (20 mg total) by mouth daily.   senna-docusate (SENOKOT-S) 8.6-50 MG tablet Take 2 tablets by mouth 2 (two) times daily as needed for moderate constipation.   traZODone (DESYREL) 50 MG tablet Take 1 tablet (50 mg total) by mouth at bedtime as needed for sleep. For sleep   [DISCONTINUED] tobramycin (TOBREX) 0.3 % ophthalmic solution Place 1 drop into both eyes every 4 (four) hours.   amLODipine (NORVASC) 10 MG tablet Take 1 tablet (10 mg total) by mouth daily.   atorvastatin (LIPITOR) 80 MG tablet Take 1 tablet (80 mg total) by mouth daily. Elevated cholesterol   insulin isophane & regular human KwikPen (NOVOLIN 70/30 KWIKPEN) (70-30) 100 UNIT/ML KwikPen Inject 50 Units into the skin 2 (two) times daily with a meal.  metoprolol tartrate (LOPRESSOR) 50 MG tablet Take 1 tablet (50 mg total) by mouth 2 (two) times daily.   tobramycin (TOBREX) 0.3 % ophthalmic solution Place 1 drop into both eyes every 4 (four) hours.   No facility-administered encounter medications on file as of 09/03/2023.    Past Medical History:  Diagnosis Date   Abnormal Pap smear    Bipolar 1 disorder (HCC)    Diabetes mellitus without complication (HCC)    Fibroids    Hypertension    IBS (irritable bowel syndrome)     Past Surgical History:  Procedure Laterality Date   CERVICAL BIOPSY     CHOLECYSTECTOMY     ESSURE TUBAL LIGATION     HIATAL HERNIA REPAIR     TUBAL LIGATION      Family History  Problem Relation  Age of Onset   Diabetes Father    Diabetes Paternal Grandmother    Diabetes Maternal Grandmother    Mental illness Cousin    Healthy Mother     Social History   Socioeconomic History   Marital status: Single    Spouse name: Not on file   Number of children: 2   Years of education: Not on file   Highest education level: Master's degree (e.g., MA, MS, MEng, MEd, MSW, MBA)  Occupational History   Not on file  Tobacco Use   Smoking status: Former    Types: Cigarettes   Smokeless tobacco: Never  Vaping Use   Vaping status: Some Days   Substances: Nicotine, Flavoring  Substance and Sexual Activity   Alcohol use: Yes    Comment: occasional   Drug use: Not Currently   Sexual activity: Yes    Birth control/protection: Surgical  Other Topics Concern   Not on file  Social History Narrative   ** Merged History Encounter **       Social Drivers of Health   Financial Resource Strain: Medium Risk (08/22/2023)   Overall Financial Resource Strain (CARDIA)    Difficulty of Paying Living Expenses: Somewhat hard  Food Insecurity: Food Insecurity Present (08/22/2023)   Hunger Vital Sign    Worried About Running Out of Food in the Last Year: Sometimes true    Ran Out of Food in the Last Year: Sometimes true  Transportation Needs: Unmet Transportation Needs (08/22/2023)   PRAPARE - Transportation    Lack of Transportation (Medical): Yes    Lack of Transportation (Non-Medical): Yes  Physical Activity: Insufficiently Active (08/22/2023)   Exercise Vital Sign    Days of Exercise per Week: 3 days    Minutes of Exercise per Session: 10 min  Stress: Stress Concern Present (08/22/2023)   [employer] of Occupational Health - Occupational Stress Questionnaire    Feeling of Stress : Very much  Social Connections: Moderately Integrated (08/22/2023)   Social Connection and Isolation Panel [NHANES]    Frequency of Communication with Friends and Family: More than three times a week     Frequency of Social Gatherings with Friends and Family: Twice a week    Attends Religious Services: 1 to 4 times per year    Active Member of [employer] or Organizations: Yes    Attends Banker Meetings: 1 to 4 times per year    Marital Status: Divorced  Intimate Partner Violence: Not At Risk (12/13/2022)   Humiliation, Afraid, Rape, and Kick questionnaire    Fear of Current or Ex-Partner: No    Emotionally Abused: No    Physically Abused: No  Sexually Abused: No    Review of Systems  Eyes:  Positive for blurred vision, pain and redness. Negative for photophobia and discharge.  All other systems reviewed and are negative.       Objective    BP 136/77   Pulse 90   Temp 98.2 F (36.8 C) (Oral)   Resp 16   Ht 6' (1.829 m)   Wt 291 lb 12.8 oz (132.4 kg)   SpO2 97%   BMI 39.58 kg/m   Physical Exam Vitals and nursing note reviewed.  Constitutional:      General: She is not in acute distress. Eyes:     Conjunctiva/sclera:     Right eye: Right conjunctiva is injected.     Left eye: Left conjunctiva is injected.  Cardiovascular:     Rate and Rhythm: Normal rate and regular rhythm.  Pulmonary:     Effort: Pulmonary effort is normal.     Breath sounds: Normal breath sounds.  Abdominal:     Palpations: Abdomen is soft.     Tenderness: There is no abdominal tenderness.  Neurological:     General: No focal deficit present.     Mental Status: She is alert and oriented to person, place, and time.         Assessment & Plan:   Blepharoconjunctivitis of both eyes, unspecified blepharoconjunctivitis type -     Ambulatory referral to Ophthalmology  Encounter for completion of form with patient  Other orders -     Tobramycin; Place 1 drop into both eyes every 4 (four) hours.  Dispense: 5 mL; Refill: 0     Return if symptoms worsen or fail to improve.   Tommie Raymond, MD

## 2023-09-20 ENCOUNTER — Ambulatory Visit: Payer: Self-pay

## 2023-09-20 NOTE — Telephone Encounter (Signed)
  Chief Complaint: persistent eye drainage Symptoms: eye drainage, blurry vision Frequency: constant Pertinent Negatives: Patient denies worsening vision Disposition: [] ED /[] Urgent Care (no appt availability in office) / [x] Appointment(In office/virtual)/ []  [facility]/ [] Home Care/ [] Refused Recommended Disposition /[] [facility]/ []  Follow-up with PCP Additional Notes:   2 months ongoing eye symptoms. Has finished course of eye drops. Her eyes have not improved, bilateral are affected. Drainage still present and constant, crusting over. 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 No improvement, not worsening on drops but feels discharge has actually increased. She also needs FMLA paperwork filled out she has been out of work since 09/13/23, she needs an proof of appointment letter/excuse note from PCP. Acute follow up visit scheduled 09/21/23, protocol disposition recommends 4 hour evaluation based on blurry vision but this has been ongoing prior to first acute visit.   Copied from CRM [id number]. Topic: Clinical - Red Word Triage >> Sep 20, 2023  2:26 PM Yolanda T wrote: Red Word that prompted transfer to Nurse Triage: patient called stated she has eye drops that are not working and now she has itchy, burning crusted eyes with blurrecd vision Reason for Disposition  Blurred vision  Protocols used: Eye - Pus or Discharge-A-AH

## 2023-09-20 NOTE — Telephone Encounter (Signed)
 I spoke to patient, informed her to keep her appointment and tgo talk to Dr. Andrey Campanile about this during time. Advised if symptoms get worst then to head to urgent care/emergency room.

## 2023-09-21 ENCOUNTER — Encounter: Payer: Self-pay | Admitting: Family Medicine

## 2023-09-21 ENCOUNTER — Ambulatory Visit: Payer: 59 | Admitting: Pharmacist

## 2023-09-21 ENCOUNTER — Ambulatory Visit (INDEPENDENT_AMBULATORY_CARE_PROVIDER_SITE_OTHER): Admitting: Family Medicine

## 2023-09-21 VITALS — BP 146/92 | HR 89 | Temp 98.1°F | Resp 18 | Ht 72.0 in | Wt 290.6 lb

## 2023-09-21 DIAGNOSIS — Z794 Long term (current) use of insulin: Secondary | ICD-10-CM | POA: Diagnosis not present

## 2023-09-21 DIAGNOSIS — E1169 Type 2 diabetes mellitus with other specified complication: Secondary | ICD-10-CM

## 2023-09-21 DIAGNOSIS — H10503 Unspecified blepharoconjunctivitis, bilateral: Secondary | ICD-10-CM

## 2023-09-23 LAB — MICROALBUMIN / CREATININE URINE RATIO
Creatinine, Urine: 144.5 mg/dL
Microalb/Creat Ratio: 20 mg/g{creat} (ref 0–29)
Microalbumin, Urine: 28.8 ug/mL

## 2023-09-25 ENCOUNTER — Encounter: Admitting: Urgent Care

## 2023-09-25 ENCOUNTER — Encounter: Payer: Self-pay | Admitting: Family Medicine

## 2023-09-25 NOTE — Progress Notes (Signed)
 Established Patient Office Visit  Subjective    Patient ID: [name], female    DOB: 1975-10-21  Age: 48 y.o. MRN: [id number]  CC:  Chief Complaint  Patient presents with   Eye Problem    Eye drops not working, FMLA Paperwork    HPI [name] presents for desiring medical excuse from work from September 13, 2023 until the present, She reports that her eye symptoms have made it so that she could not report to work. She has not seen a provider in the interim.   Outpatient Encounter Medications as of 09/21/2023  Medication Sig   acetaminophen (TYLENOL) 325 MG tablet Take by mouth.   ARIPiprazole (ABILIFY) 15 MG tablet Take by mouth.   benztropine (COGENTIN) 1 MG tablet Take 1 tablet (1 mg total) by mouth 2 (two) times daily.   Blood Glucose Monitoring Suppl (BLOOD GLUCOSE MONITOR SYSTEM) w/Device KIT Use 3 (three) times daily.   cetirizine (ZYRTEC) 10 MG tablet Take 1 tablet (10 mg total) by mouth daily.   docusate sodium (COLACE) 100 MG capsule Take 1 capsule (100 mg total) by mouth 2 (two) times daily.   ferrous sulfate 325 (65 FE) MG tablet Take 1 tablet (325 mg total) by mouth daily with breakfast.   Glucose Blood (BLOOD GLUCOSE TEST STRIPS) STRP Use as directed to check blood sugar three times daily   haloperidol (HALDOL) 0.5 MG tablet Take 1 tablet (0.5 mg total) by mouth 2 (two) times daily as needed for agitation.   hydrOXYzine (ATARAX) 25 MG tablet Take 0.5-1 tablets (12.5-25 mg total) by mouth every 8 (eight) hours as needed for itching.   insulin lispro (HUMALOG) 100 UNIT/ML injection Inject into the skin.   Insulin Lispro Prot & Lispro (HUMALOG MIX 75/25 KWIKPEN) (75-25) 100 UNIT/ML Kwikpen Inject 45 Units into the skin 2 (two) times daily.   Insulin Pen Needle (PEN NEEDLES) 31G X 5 MM MISC Use with insulin 3 (three) times daily.   Lancet Device MISC 1 each by Does not apply route 3 (three) times daily. May dispense any manufacturer covered by patient's insurance.   Lancets  MISC Use as directed to check blood sugar 3 times daily.   lisinopril (ZESTRIL) 40 MG tablet Take 1 tablet (40 mg total) by mouth daily.   metoCLOPramide (REGLAN) 10 MG tablet Take 1 tablet (10 mg total) by mouth 3 (three) times daily before meals.   Olopatadine HCl 0.2 % SOLN Apply 1 drop to eye daily as needed.   PARoxetine (PAXIL) 20 MG tablet Take 1 tablet (20 mg total) by mouth daily.   senna-docusate (SENOKOT-S) 8.6-50 MG tablet Take 2 tablets by mouth 2 (two) times daily as needed for moderate constipation.   tobramycin (TOBREX) 0.3 % ophthalmic solution Place 1 drop into both eyes every 4 (four) hours.   traZODone (DESYREL) 50 MG tablet Take 1 tablet (50 mg total) by mouth at bedtime as needed for sleep. For sleep   amLODipine (NORVASC) 10 MG tablet Take 1 tablet (10 mg total) by mouth daily.   atorvastatin (LIPITOR) 80 MG tablet Take 1 tablet (80 mg total) by mouth daily. Elevated cholesterol   insulin isophane & regular human KwikPen (NOVOLIN 70/30 KWIKPEN) (70-30) 100 UNIT/ML KwikPen Inject 50 Units into the skin 2 (two) times daily with a meal.   metoprolol tartrate (LOPRESSOR) 50 MG tablet Take 1 tablet (50 mg total) by mouth 2 (two) times daily.   No facility-administered encounter medications on file as of 09/21/2023.  Past Medical History:  Diagnosis Date   Abnormal Pap smear    Bipolar 1 disorder (HCC)    Diabetes mellitus without complication (HCC)    Fibroids    Hypertension    IBS (irritable bowel syndrome)     Past Surgical History:  Procedure Laterality Date   CERVICAL BIOPSY     CHOLECYSTECTOMY     ESSURE TUBAL LIGATION     HIATAL HERNIA REPAIR     TUBAL LIGATION      Family History  Problem Relation Age of Onset   Diabetes Father    Diabetes Paternal Grandmother    Diabetes Maternal Grandmother    Mental illness Cousin    Healthy Mother     Social History   Socioeconomic History   Marital status: Single    Spouse name: Not on file   Number of  children: 2   Years of education: Not on file   Highest education level: Master's degree (e.g., MA, MS, MEng, MEd, MSW, MBA)  Occupational History   Not on file  Tobacco Use   Smoking status: Former    Types: Cigarettes   Smokeless tobacco: Never  Vaping Use   Vaping status: Some Days   Substances: Nicotine, Flavoring  Substance and Sexual Activity   Alcohol use: Yes    Comment: occasional   Drug use: Not Currently   Sexual activity: Yes    Birth control/protection: Surgical  Other Topics Concern   Not on file  Social History Narrative   ** Merged History Encounter **       Social Drivers of Health   Financial Resource Strain: Medium Risk (08/22/2023)   Overall Financial Resource Strain (CARDIA)    Difficulty of Paying Living Expenses: Somewhat hard  Food Insecurity: Food Insecurity Present (08/22/2023)   Hunger Vital Sign    Worried About Running Out of Food in the Last Year: Sometimes true    Ran Out of Food in the Last Year: Sometimes true  Transportation Needs: Unmet Transportation Needs (08/22/2023)   PRAPARE - Transportation    Lack of Transportation (Medical): Yes    Lack of Transportation (Non-Medical): Yes  Physical Activity: Insufficiently Active (08/22/2023)   Exercise Vital Sign    Days of Exercise per Week: 3 days    Minutes of Exercise per Session: 10 min  Stress: Stress Concern Present (08/22/2023)   [employer] of Occupational Health - Occupational Stress Questionnaire    Feeling of Stress : Very much  Social Connections: Moderately Integrated (08/22/2023)   Social Connection and Isolation Panel [NHANES]    Frequency of Communication with Friends and Family: More than three times a week    Frequency of Social Gatherings with Friends and Family: Twice a week    Attends Religious Services: 1 to 4 times per year    Active Member of [employer] or Organizations: Yes    Attends Banker Meetings: 1 to 4 times per year    Marital Status: Divorced   Intimate Partner Violence: Not At Risk (12/13/2022)   Humiliation, Afraid, Rape, and Kick questionnaire    Fear of Current or Ex-Partner: No    Emotionally Abused: No    Physically Abused: No    Sexually Abused: No    Review of Systems  All other systems reviewed and are negative.       Objective    BP (!) 146/92   Pulse 89   Temp 98.1 F (36.7 C) (Oral)   Resp 18  Ht 6' (1.829 m)   Wt 290 lb 9.6 oz (131.8 kg)   SpO2 95%   BMI 39.41 kg/m   Physical Exam Vitals and nursing note reviewed.  Constitutional:      General: She is not in acute distress. Eyes:     Extraocular Movements: Extraocular movements intact.     Conjunctiva/sclera:     Right eye: Right conjunctiva is not injected. No exudate.    Left eye: Left conjunctiva is not injected. No exudate. Cardiovascular:     Rate and Rhythm: Normal rate and regular rhythm.  Pulmonary:     Effort: Pulmonary effort is normal.     Breath sounds: Normal breath sounds.  Neurological:     General: No focal deficit present.     Mental Status: She is alert and oriented to person, place, and time.         Assessment & Plan:  1. Blepharoconjunctivitis of both eyes, unspecified blepharoconjunctivitis type (Primary) Patient awaiting evaluation by consultant  2. Type 2 diabetes mellitus with other specified complication, with long-term current use of insulin (HCC)  - Urine Albumin/Creatinine with ratio (send out) [LAB689]   Patient is insistent that she needs days off for her eye sx. She has not been to consultant as of yet for evaluation.   No follow-ups on file.   Tommie Raymond, MD

## 2023-10-02 ENCOUNTER — Encounter: Payer: Self-pay | Admitting: Urgent Care

## 2023-10-02 ENCOUNTER — Telehealth: Payer: Self-pay | Admitting: Family Medicine

## 2023-10-02 ENCOUNTER — Ambulatory Visit (INDEPENDENT_AMBULATORY_CARE_PROVIDER_SITE_OTHER): Payer: MEDICAID | Admitting: Urgent Care

## 2023-10-02 ENCOUNTER — Telehealth (HOSPITAL_BASED_OUTPATIENT_CLINIC_OR_DEPARTMENT_OTHER): Payer: Self-pay | Admitting: *Deleted

## 2023-10-02 VITALS — BP 174/105 | HR 116 | Ht 71.0 in | Wt 278.1 lb

## 2023-10-02 DIAGNOSIS — I10 Essential (primary) hypertension: Secondary | ICD-10-CM | POA: Diagnosis not present

## 2023-10-02 DIAGNOSIS — E782 Mixed hyperlipidemia: Secondary | ICD-10-CM

## 2023-10-02 DIAGNOSIS — F3164 Bipolar disorder, current episode mixed, severe, with psychotic features: Secondary | ICD-10-CM

## 2023-10-02 DIAGNOSIS — H5789 Other specified disorders of eye and adnexa: Secondary | ICD-10-CM

## 2023-10-02 DIAGNOSIS — D509 Iron deficiency anemia, unspecified: Secondary | ICD-10-CM

## 2023-10-02 DIAGNOSIS — E1142 Type 2 diabetes mellitus with diabetic polyneuropathy: Secondary | ICD-10-CM | POA: Diagnosis not present

## 2023-10-02 DIAGNOSIS — Z794 Long term (current) use of insulin: Secondary | ICD-10-CM

## 2023-10-02 MED ORDER — PEN NEEDLES 31G X 5 MM MISC: 1.0000 | Freq: Every day | 3 refills | Status: AC

## 2023-10-02 MED ORDER — LISINOPRIL-HYDROCHLOROTHIAZIDE 20-25 MG PO TABS: 1.0000 | ORAL_TABLET | Freq: Every day | ORAL | 3 refills | Status: DC

## 2023-10-02 MED ORDER — TRESIBA FLEXTOUCH 100 UNIT/ML ~~LOC~~ SOPN: 25.0000 [IU] | PEN_INJECTOR | Freq: Every day | SUBCUTANEOUS | 0 refills | Status: DC

## 2023-10-02 NOTE — Telephone Encounter (Signed)
 Copied from CRM [id number]. Topic: Appointments - Transfer of Care >> Oct 02, 2023  1:27 PM Geneva B wrote: Pt is requesting to transfer FROM: [facility] Pt is requesting to transfer [facility] Reason for requested transfer: wants a new provider  It is the responsibility of the team the patient would like to transfer to (Dr. Jerre Simon) to reach out to the patient if for any reason this transfer is not acceptable.

## 2023-10-02 NOTE — Telephone Encounter (Signed)
Patient appt made.  

## 2023-10-02 NOTE — Telephone Encounter (Signed)
 Copied from CRM [id number]. Topic: Appointments - Transfer of Care >> Oct 02, 2023  1:23 PM Geneva B wrote: Pt is requesting to transfer FROM:[facility] Pt is requesting to transfer TO: [facility] Reason for requested transfer: no longer wants to see her provider It is the responsibility of the team the patient would like to transfer to (Dr. Jerre Simon ) to reach out to the patient if for any reason this transfer is not acceptable.

## 2023-10-02 NOTE — Progress Notes (Unsigned)
 New Patient Office Visit  Subjective:  Patient ID: [name], female    DOB: Apr 07, 1976  Age: 48 y.o. MRN: [id number]  CC:  Chief Complaint  Patient presents with   Establish Care    New pt transferring care. Pt wants to discuss FMLA paperwork.    HPI [name] presents to establish care. She is transferring care from a different location.   Discussed the use of AI scribe software for clinical note transcription with the patient, who gave verbal consent to proceed.  History of Present Illness   [name] is a 48 year old female who presents with eye irritation and medication management. She was referred by Dr. Andrey Campanile for continuation of care.  She has been experiencing eye issues, initially diagnosed on July 16, 2023, at [facility] urgent care. Symptoms include loss of eyelashes and eyebrows, which are now regrowing, and irritation primarily on the edges of both eyelids, described as a stabbing sensation. 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 Initial treatment with allergy and antibiotic drops was ineffective. A nurse advised using separate washcloths for each eye to prevent cross-contamination. She has an upcoming appointment [facility] for further [facility].  She has a history of DM and uses Novolin 70/30 insulin, administering 50 units in the morning and 50 units at night, purchased over the counter from [employer]. Her last A1c was 10.9% in February, down from 13% previously. She has been referred to a nutritionist for dietary management. She takes no PO medications for diabetes.  She has a history of hypertension and has been out of her blood pressure medications, including amlodipine and metoprolol, since June 2024. She reports not taking lisinopril recently and experiences stress, acknowledging her blood pressure is high. In office today, she  denies HA, blurred vision, tinnitus, CP, SOB, dizziness or slurred speech. She states apart from her 4 psychiatric medications and her OTC insulin, she ran out of all her Rx medications and has not been on them for >6 months.  Her psychiatric history includes bipolar disorder, for which she takes haldol, Paxil, trazodone, and benztropine, all prescribed by her psychiatrist. She no longer takes Abilify. She also reports a history of schizoaffective disorder, PTSD and personality disorder, though these are not currently active diagnoses. She denies any hallucinations.  She experiences occasional numbness in her right pinky toe, possibly related to footwear, and has a history of neuropathy, previously managed with gabapentin, which she no longer takes.   She has a history of iron deficiency and was previously taking colace and ferrous sulfate, both of which have been discontinued.     Outpatient Encounter Medications as of 10/02/2023  Medication Sig   acetaminophen (TYLENOL) 325 MG tablet Take by mouth.   benztropine (COGENTIN) 1 MG tablet Take 1 tablet (1 mg total) by mouth 2 (two) times daily.   Blood Glucose Monitoring Suppl (BLOOD GLUCOSE MONITOR SYSTEM) w/Device KIT Use 3 (three) times daily.   ferrous sulfate 325 (65 FE) MG tablet Take 1 tablet (325 mg total) by mouth daily with breakfast.   Glucose Blood (BLOOD GLUCOSE TEST STRIPS) STRP Use as directed to check blood sugar three times daily   haloperidol (HALDOL) 0.5 MG tablet Take 1 tablet (0.5 mg total) by mouth 2 (two) times daily as needed for agitation.   hydrOXYzine (ATARAX) 25 MG tablet Take 0.5-1 tablets (12.5-25 mg total) by mouth  every 8 (eight) hours as needed for itching.   insulin degludec (TRESIBA FLEXTOUCH) 100 UNIT/ML FlexTouch Pen Inject 25 Units into the skin daily.   Lancet Device MISC 1 each by Does not apply route 3 (three) times daily. May dispense any manufacturer covered by patient's insurance.   Lancets MISC Use as  directed to check blood sugar 3 times daily.   lisinopril-hydrochlorothiazide (ZESTORETIC) 20-25 MG tablet Take 1 tablet by mouth daily.   metoCLOPramide (REGLAN) 10 MG tablet Take 1 tablet (10 mg total) by mouth 3 (three) times daily before meals.   Olopatadine HCl 0.2 % SOLN Apply 1 drop to eye daily as needed.   PARoxetine (PAXIL) 20 MG tablet Take 1 tablet (20 mg total) by mouth daily.   senna-docusate (SENOKOT-S) 8.6-50 MG tablet Take 2 tablets by mouth 2 (two) times daily as needed for moderate constipation.   tobramycin (TOBREX) 0.3 % ophthalmic solution Place 1 drop into both eyes every 4 (four) hours.   traZODone (DESYREL) 50 MG tablet Take 1 tablet (50 mg total) by mouth at bedtime as needed for sleep. For sleep   [DISCONTINUED] ARIPiprazole (ABILIFY) 15 MG tablet Take by mouth.   [DISCONTINUED] cetirizine (ZYRTEC) 10 MG tablet Take 1 tablet (10 mg total) by mouth daily.   [DISCONTINUED] docusate sodium (COLACE) 100 MG capsule Take 1 capsule (100 mg total) by mouth 2 (two) times daily.   [DISCONTINUED] insulin lispro (HUMALOG) 100 UNIT/ML injection Inject into the skin.   [DISCONTINUED] Insulin Lispro Prot & Lispro (HUMALOG MIX 75/25 KWIKPEN) (75-25) 100 UNIT/ML Kwikpen Inject 45 Units into the skin 2 (two) times daily.   [DISCONTINUED] Insulin Pen Needle (PEN NEEDLES) 31G X 5 MM MISC Use with insulin 3 (three) times daily.   [DISCONTINUED] lisinopril (ZESTRIL) 40 MG tablet Take 1 tablet (40 mg total) by mouth daily.   Insulin Pen Needle (PEN NEEDLES) 31G X 5 MM MISC 1 each by Does not apply route daily.   [DISCONTINUED] amLODipine (NORVASC) 10 MG tablet Take 1 tablet (10 mg total) by mouth daily.   [DISCONTINUED] atorvastatin (LIPITOR) 80 MG tablet Take 1 tablet (80 mg total) by mouth daily. Elevated cholesterol   [DISCONTINUED] insulin isophane & regular human KwikPen (NOVOLIN 70/30 KWIKPEN) (70-30) 100 UNIT/ML KwikPen Inject 50 Units into the skin 2 (two) times daily with a meal.    [DISCONTINUED] metoprolol tartrate (LOPRESSOR) 50 MG tablet Take 1 tablet (50 mg total) by mouth 2 (two) times daily.   No facility-administered encounter medications on file as of 10/02/2023.    Past Medical History:  Diagnosis Date   Abnormal Pap smear    Bipolar 1 disorder (HCC)    Diabetes mellitus without complication (HCC)    Fibroids    Hypertension    IBS (irritable bowel syndrome)     Past Surgical History:  Procedure Laterality Date   CERVICAL BIOPSY     CHOLECYSTECTOMY     ESSURE TUBAL LIGATION     HIATAL HERNIA REPAIR     TUBAL LIGATION      Family History  Problem Relation Age of Onset   Diabetes Father    Diabetes Paternal Grandmother    Diabetes Maternal Grandmother    Mental illness Cousin    Healthy Mother     Social History   Socioeconomic History   Marital status: Single    Spouse name: Not on file   Number of children: 2   Years of education: Not on file   Highest education level:  Master's degree (e.g., MA, MS, MEng, MEd, MSW, MBA)  Occupational History   Not on file  Tobacco Use   Smoking status: Former    Types: Cigarettes   Smokeless tobacco: Never  Vaping Use   Vaping status: Some Days   Substances: Nicotine, Flavoring  Substance and Sexual Activity   Alcohol use: Yes    Comment: occasional   Drug use: Not Currently   Sexual activity: Yes    Birth control/protection: Surgical  Other Topics Concern   Not on file  Social History Narrative   ** Merged History Encounter **       Social Drivers of Health   Financial Resource Strain: Medium Risk (08/22/2023)   Overall Financial Resource Strain (CARDIA)    Difficulty of Paying Living Expenses: Somewhat hard  Food Insecurity: Food Insecurity Present (08/22/2023)   Hunger Vital Sign    Worried About Running Out of Food in the Last Year: Sometimes true    Ran Out of Food in the Last Year: Sometimes true  Transportation Needs: Unmet Transportation Needs (08/22/2023)   PRAPARE -  Transportation    Lack of Transportation (Medical): Yes    Lack of Transportation (Non-Medical): Yes  Physical Activity: Insufficiently Active (08/22/2023)   Exercise Vital Sign    Days of Exercise per Week: 3 days    Minutes of Exercise per Session: 10 min  Stress: Stress Concern Present (08/22/2023)   [employer] of Occupational Health - Occupational Stress Questionnaire    Feeling of Stress : Very much  Social Connections: Moderately Integrated (08/22/2023)   Social Connection and Isolation Panel [NHANES]    Frequency of Communication with Friends and Family: More than three times a week    Frequency of Social Gatherings with Friends and Family: Twice a week    Attends Religious Services: 1 to 4 times per year    Active Member of [employer] or Organizations: Yes    Attends Banker Meetings: 1 to 4 times per year    Marital Status: Divorced  Catering manager Violence: Not At Risk (12/13/2022)   Humiliation, Afraid, Rape, and Kick questionnaire    Fear of Current or Ex-Partner: No    Emotionally Abused: No    Physically Abused: No    Sexually Abused: No    ROS: as noted in HPI  Objective:  BP (!) 174/105   Pulse (!) 116   Ht 5\' 11"  (1.803 m)   Wt 278 lb 1.9 oz (126.2 kg)   SpO2 98%   BMI 38.79 kg/m   Physical Exam Vitals and nursing note reviewed.  Constitutional:      General: She is not in acute distress.    Appearance: Normal appearance. She is obese. She is not ill-appearing, toxic-appearing or diaphoretic.  HENT:     Head: Normocephalic and atraumatic.     Right Ear: External ear normal.     Left Ear: External ear normal.     Mouth/Throat:     Mouth: Mucous membranes are moist.     Pharynx: No oropharyngeal exudate or posterior oropharyngeal erythema.  Eyes:     General: Lids are normal. Lids are everted, no foreign bodies appreciated. No allergic shiner or scleral icterus.       Right eye: No discharge.        Left eye: No discharge.      Extraocular Movements: Extraocular movements intact.     Right eye: Normal extraocular motion.     Left eye: Normal extraocular motion.  Conjunctiva/sclera:     Right eye: Right conjunctiva is not injected. No chemosis, exudate or hemorrhage.    Left eye: Left conjunctiva is not injected. No chemosis, exudate or hemorrhage.    Pupils: Pupils are equal, round, and reactive to light.     Right eye: Pupil is round, reactive and not sluggish.     Left eye: Pupil is round, reactive and not sluggish.      Comments: No erythema to sclera or conjunctiva. No discharge No ciliary flush  Cardiovascular:     Rate and Rhythm: Normal rate and regular rhythm.  Pulmonary:     Effort: Pulmonary effort is normal. No respiratory distress.     Breath sounds: Normal breath sounds. No stridor. No wheezing or rhonchi.  Musculoskeletal:     Cervical back: Normal range of motion and neck supple. No rigidity or tenderness.  Lymphadenopathy:     Cervical: No cervical adenopathy.  Skin:    General: Skin is warm and dry.  Neurological:     General: No focal deficit present.     Mental Status: She is alert and oriented to person, place, and time.     Motor: No weakness.     Gait: Gait normal.     Last CBC Lab Results  Component Value Date   WBC 13.4 (H) 04/23/2023   HGB 11.9 (L) 04/23/2023   HCT 37.1 04/23/2023   MCV 81.7 04/23/2023   MCH 26.2 04/23/2023   RDW 13.4 04/23/2023   PLT 354 04/23/2023   Last metabolic panel Lab Results  Component Value Date   GLUCOSE 240 (H) 04/23/2023   NA 132 (L) 04/23/2023   K 4.0 04/23/2023   CL 99 04/23/2023   CO2 18 (L) 04/23/2023   BUN 9 04/23/2023   CREATININE 0.67 04/23/2023   GFRNONAA >60 04/23/2023   CALCIUM 9.6 04/23/2023   PHOS 3.8 12/14/2022   PROT 7.7 04/23/2023   ALBUMIN 3.5 04/23/2023   LABGLOB 3.2 02/25/2020   AGRATIO 1.2 02/25/2020   BILITOT 0.8 04/23/2023   ALKPHOS 91 04/23/2023   AST 18 04/23/2023   ALT 19 04/23/2023   ANIONGAP 15  04/23/2023   Last lipids Lab Results  Component Value Date   CHOL 101 05/17/2021   HDL 32 (L) 05/17/2021   LDLCALC 56 05/17/2021   TRIG 65 05/17/2021   CHOLHDL 3.2 05/17/2021   Last hemoglobin A1c Lab Results  Component Value Date   HGBA1C 10.9 (A) 08/23/2023   Last thyroid functions Lab Results  Component Value Date   TSH 0.500 11/08/2022   Last vitamin D Lab Results  Component Value Date   VD25OH 17.60 (L) 05/24/2021   Last vitamin B12 and Folate Lab Results  Component Value Date   VITAMINB12 517 11/11/2022   FOLATE 15.7 11/11/2022      Assessment & Plan:  Essential hypertension -     CBC with Differential/Platelet -     TSH -     Lipid panel -     Comprehensive metabolic panel with GFR -     Lisinopril-hydroCHLOROthiazide; Take 1 tablet by mouth daily.  Dispense: 90 tablet; Refill: 3  Type 2 diabetes mellitus with diabetic polyneuropathy, with long-term current use of insulin (HCC) -     Evaristo Bury FlexTouch; Inject 25 Units into the skin daily.  Dispense: 7.5 mL; Refill: 0 -     Pen Needles; 1 each by Does not apply route daily.  Dispense: 100 each; Refill: 3  Mixed hyperlipidemia  Bipolar  disorder, curr episode mixed, severe, with psychotic features (HCC)  Microcytic anemia  Eye irritation  Assessment and Plan    Recurrent eye issues Pt reports concern of "contact dermatitis" of the eyelid since July 16, 2023, with symptoms of eyelash and eyebrow loss, and eye pain. Previous treatment with allergy and antibiotic drops was ineffective. No current infection. Differential diagnosis includes glaucoma due to corneal spots and eye pain. Reports astigmatism to light and uses blue light glasses for work-related eye strain. - Pt has an appointment with [facility] for further evaluation and management next week - Discontinue tobramycin as there is no infection  Diabetes Mellitus Poorly controlled diabetes mellitus with a recent A1c of 10.9% in  February 2025, down from 13%. Current insulin regimen of Novolin 70/30 is not effectively managing blood glucose levels. She is fasting and has been referred to a nutritionist for dietary management. Proposed switch to [place], a long-acting insulin, for better absorption and management, pending insurance coverage. - Switch insulin to [place], pending insurance coverage - If Evaristo Bury is not covered, contact the office for an alternative - Check random glucose levels - Follow up with nutritionist for dietary management  Hypertension Uncontrolled hypertension with a blood pressure reading of 170/105. She has not been taking prescribed antihypertensive medications due to prescription issues. Plan to restart antihypertensive therapy with lisinopril hydrochlorothiazide combination for convenience and improved compliance. - Prescribe lisinopril hydrochlorothiazide combination - Advise taking blood pressure medication in the morning  Bipolar Disorder Bipolar disorder managed by a psychiatrist with current medications including haldol, Paxil, trazodone, and benztropine. Previous diagnosis of schizoaffective disorder and PTSD, but currently only bipolar disorder is active.  General Health Maintenance Experiencing stress and fasting, which may impact overall health. No recent comprehensive lab work other than A1c in February 2025. - Order comprehensive lab work to update health status        Return in about 2 weeks (around 10/16/2023).   Maretta Bees, PA

## 2023-10-02 NOTE — Patient Instructions (Addendum)
 Stop your over the counter insulin and start using [place].  Start with 25 units every night.  Restart your blood pressure medications - I have called in lisinopril-hydrochlorothiazide. Take this once daily in the morning.  Please follow up with Dr. Elmer Picker for your eye concerns.  Please return here for recheck in 2 weeks

## 2023-10-03 LAB — LIPID PANEL
Cholesterol: 205 mg/dL — ABNORMAL HIGH (ref 0–200)
HDL: 44.3 mg/dL (ref >39.00)
LDL Cholesterol: 146 mg/dL — ABNORMAL HIGH (ref 0–99)
NonHDL: 160.58
Total CHOL/HDL Ratio: 5
Triglycerides: 73 mg/dL (ref 0.0–149.0)
VLDL: 14.6 mg/dL (ref 0.0–40.0)

## 2023-10-03 LAB — COMPREHENSIVE METABOLIC PANEL WITH GFR
ALT: 16 U/L (ref 0–35)
AST: 14 U/L (ref 0–37)
Albumin: 4.3 g/dL (ref 3.5–5.2)
Alkaline Phosphatase: 95 U/L (ref 39–117)
BUN: 7 mg/dL (ref 6–23)
CO2: 21 meq/L (ref 19–32)
Calcium: 9.6 mg/dL (ref 8.4–10.5)
Chloride: 103 meq/L (ref 96–112)
Creatinine, Ser: 0.75 mg/dL (ref 0.40–1.20)
GFR: 95.65 mL/min (ref >60.00)
Glucose, Bld: 223 mg/dL — ABNORMAL HIGH (ref 70–99)
Potassium: 4.2 meq/L (ref 3.5–5.1)
Sodium: 135 meq/L (ref 135–145)
Total Bilirubin: 0.7 mg/dL (ref 0.2–1.2)
Total Protein: 7.6 g/dL (ref 6.0–8.3)

## 2023-10-03 LAB — CBC WITH DIFFERENTIAL/PLATELET
Basophils Absolute: 0.1 10*3/uL (ref 0.0–0.1)
Basophils Relative: 1 % (ref 0.0–3.0)
Eosinophils Absolute: 0.1 10*3/uL (ref 0.0–0.7)
Eosinophils Relative: 0.6 % (ref 0.0–5.0)
HCT: 41.5 % (ref 36.0–46.0)
Hemoglobin: 13.4 g/dL (ref 12.0–15.0)
Lymphocytes Relative: 14.2 % (ref 12.0–46.0)
Lymphs Abs: 1.5 10*3/uL (ref 0.7–4.0)
MCHC: 32.4 g/dL (ref 30.0–36.0)
MCV: 81.3 fl (ref 78.0–100.0)
Monocytes Absolute: 0.5 10*3/uL (ref 0.1–1.0)
Monocytes Relative: 4.4 % (ref 3.0–12.0)
Neutro Abs: 8.6 10*3/uL — ABNORMAL HIGH (ref 1.4–7.7)
Neutrophils Relative %: 79.8 % — ABNORMAL HIGH (ref 43.0–77.0)
Platelets: 411 10*3/uL — ABNORMAL HIGH (ref 150.0–400.0)
RBC: 5.11 Mil/uL (ref 3.87–5.11)
RDW: 16.2 % — ABNORMAL HIGH (ref 11.5–15.5)
WBC: 10.8 10*3/uL — ABNORMAL HIGH (ref 4.0–10.5)

## 2023-10-03 LAB — TSH: TSH: 0.42 u[IU]/mL (ref 0.35–5.50)

## 2023-10-04 ENCOUNTER — Encounter: Payer: Self-pay | Admitting: Urgent Care

## 2023-10-05 ENCOUNTER — Other Ambulatory Visit: Payer: Self-pay

## 2023-10-05 ENCOUNTER — Ambulatory Visit (HOSPITAL_COMMUNITY)
Admission: EM | Admit: 2023-10-05 | Discharge: 2023-10-05 | Disposition: A | Discharge status: Other Acute Inpatient Hospital | Payer: MEDICAID | Attending: Psychiatry | Admitting: Psychiatry

## 2023-10-05 ENCOUNTER — Encounter (HOSPITAL_COMMUNITY): Payer: Self-pay

## 2023-10-05 DIAGNOSIS — R45851 Suicidal ideations: Secondary | ICD-10-CM | POA: Insufficient documentation

## 2023-10-05 DIAGNOSIS — Z638 Other specified problems related to primary support group: Secondary | ICD-10-CM | POA: Insufficient documentation

## 2023-10-05 DIAGNOSIS — F200 Paranoid schizophrenia: Secondary | ICD-10-CM | POA: Insufficient documentation

## 2023-10-05 DIAGNOSIS — E119 Type 2 diabetes mellitus without complications: Secondary | ICD-10-CM

## 2023-10-05 DIAGNOSIS — F1290 Cannabis use, unspecified, uncomplicated: Secondary | ICD-10-CM | POA: Diagnosis not present

## 2023-10-05 DIAGNOSIS — Z79899 Other long term (current) drug therapy: Secondary | ICD-10-CM | POA: Insufficient documentation

## 2023-10-05 DIAGNOSIS — R440 Auditory hallucinations: Secondary | ICD-10-CM

## 2023-10-05 HISTORY — DX: Type 1 diabetes mellitus without complications: E10.9

## 2023-10-05 LAB — COMPREHENSIVE METABOLIC PANEL WITH GFR
ALT: 20 U/L (ref 0–44)
AST: 20 U/L (ref 15–41)
Albumin: 3.9 g/dL (ref 3.5–5.0)
Alkaline Phosphatase: 84 U/L (ref 38–126)
Anion gap: 16 — ABNORMAL HIGH (ref 5–15)
BUN: 11 mg/dL (ref 6–20)
CO2: 15 mmol/L — ABNORMAL LOW (ref 22–32)
Calcium: 10.2 mg/dL (ref 8.9–10.3)
Chloride: 102 mmol/L (ref 98–111)
Creatinine, Ser: 1.16 mg/dL — ABNORMAL HIGH (ref 0.44–1.00)
GFR, Estimated: 59 mL/min — ABNORMAL LOW (ref >60)
Glucose, Bld: 305 mg/dL — ABNORMAL HIGH (ref 70–99)
Potassium: 3.7 mmol/L (ref 3.5–5.1)
Sodium: 133 mmol/L — ABNORMAL LOW (ref 135–145)
Total Bilirubin: 1.1 mg/dL (ref 0.0–1.2)
Total Protein: 7.8 g/dL (ref 6.5–8.1)

## 2023-10-05 LAB — CBC WITH DIFFERENTIAL/PLATELET
Abs Immature Granulocytes: 0.07 10*3/uL (ref 0.00–0.07)
Basophils Absolute: 0.1 10*3/uL (ref 0.0–0.1)
Basophils Relative: 1 %
Eosinophils Absolute: 0.1 10*3/uL (ref 0.0–0.5)
Eosinophils Relative: 0 %
HCT: 41.6 % (ref 36.0–46.0)
Hemoglobin: 13.5 g/dL (ref 12.0–15.0)
Immature Granulocytes: 1 %
Lymphocytes Relative: 14 %
Lymphs Abs: 1.9 10*3/uL (ref 0.7–4.0)
MCH: 25.4 pg — ABNORMAL LOW (ref 26.0–34.0)
MCHC: 32.5 g/dL (ref 30.0–36.0)
MCV: 78.2 fL — ABNORMAL LOW (ref 80.0–100.0)
Monocytes Absolute: 0.9 10*3/uL (ref 0.1–1.0)
Monocytes Relative: 7 %
Neutro Abs: 10.4 10*3/uL — ABNORMAL HIGH (ref 1.7–7.7)
Neutrophils Relative %: 77 %
Platelets: 445 10*3/uL — ABNORMAL HIGH (ref 150–400)
RBC: 5.32 MIL/uL — ABNORMAL HIGH (ref 3.87–5.11)
RDW: 15.2 % (ref 11.5–15.5)
WBC: 13.4 10*3/uL — ABNORMAL HIGH (ref 4.0–10.5)
nRBC: 0 % (ref 0.0–0.2)

## 2023-10-05 LAB — TSH: TSH: 0.979 u[IU]/mL (ref 0.350–4.500)

## 2023-10-05 LAB — POCT URINE DRUG SCREEN - MANUAL ENTRY (I-SCREEN)
POC Amphetamine UR: NOT DETECTED
POC Buprenorphine (BUP): NOT DETECTED
POC Cocaine UR: NOT DETECTED
POC Marijuana UR: POSITIVE — AB
POC Methadone UR: NOT DETECTED
POC Methamphetamine UR: NOT DETECTED
POC Morphine: NOT DETECTED
POC Oxazepam (BZO): NOT DETECTED
POC Oxycodone UR: NOT DETECTED
POC Secobarbital (BAR): NOT DETECTED

## 2023-10-05 LAB — POC URINE PREG, ED: Preg Test, Ur: NEGATIVE

## 2023-10-05 LAB — GLUCOSE, CAPILLARY
Glucose-Capillary: 264 mg/dL — ABNORMAL HIGH (ref 70–99)
Glucose-Capillary: 254 mg/dL — ABNORMAL HIGH (ref 70–99)

## 2023-10-05 LAB — ETHANOL: Alcohol, Ethyl (B): <10 mg/dL (ref <10)

## 2023-10-05 MED ORDER — LORAZEPAM 1 MG PO TABS
2.0000 mg | ORAL_TABLET | Freq: Once | ORAL | Status: AC
  Administered 2023-10-05: 2 mg via ORAL
  Filled 2023-10-05: qty 2

## 2023-10-05 MED ORDER — HYDROCHLOROTHIAZIDE 25 MG PO TABS: 25.0000 mg | ORAL_TABLET | Freq: Every day | ORAL | Status: DC

## 2023-10-05 MED ORDER — OLANZAPINE 10 MG IM SOLR: 5.0000 mg | Freq: Three times a day (TID) | INTRAMUSCULAR | Status: DC | PRN

## 2023-10-05 MED ORDER — METOPROLOL TARTRATE 25 MG PO TABS
12.5000 mg | ORAL_TABLET | Freq: Once | ORAL | Status: AC
  Administered 2023-10-05: 12.5 mg via ORAL
  Filled 2023-10-05: qty 1

## 2023-10-05 MED ORDER — ALUM & MAG HYDROXIDE-SIMETH 200-200-20 MG/5ML PO SUSP: 30.0000 mL | ORAL | Status: DC | PRN

## 2023-10-05 MED ORDER — OLANZAPINE 10 MG IM SOLR: 10.0000 mg | Freq: Three times a day (TID) | INTRAMUSCULAR | Status: DC | PRN

## 2023-10-05 MED ORDER — MAGNESIUM HYDROXIDE 400 MG/5ML PO SUSP: 30.0000 mL | Freq: Every day | ORAL | Status: DC | PRN

## 2023-10-05 MED ORDER — LORAZEPAM 2 MG/ML IJ SOLN: 2.0000 mg | Freq: Once | INTRAMUSCULAR | Status: AC

## 2023-10-05 MED ORDER — BENZTROPINE MESYLATE 1 MG PO TABS: 1.0000 mg | ORAL_TABLET | Freq: Two times a day (BID) | ORAL | Status: DC

## 2023-10-05 MED ORDER — HALOPERIDOL 5 MG PO TABS: 5.0000 mg | ORAL_TABLET | Freq: Two times a day (BID) | ORAL | Status: DC

## 2023-10-05 MED ORDER — LISINOPRIL-HYDROCHLOROTHIAZIDE 20-25 MG PO TABS: 1.0000 | ORAL_TABLET | Freq: Every day | ORAL | Status: DC

## 2023-10-05 MED ORDER — INSULIN DEGLUDEC 100 UNIT/ML ~~LOC~~ SOPN: 25.0000 [IU] | PEN_INJECTOR | Freq: Every day | SUBCUTANEOUS | Status: DC

## 2023-10-05 MED ORDER — ACETAMINOPHEN 325 MG PO TABS: 650.0000 mg | ORAL_TABLET | Freq: Four times a day (QID) | ORAL | Status: DC | PRN

## 2023-10-05 MED ORDER — LISINOPRIL 20 MG PO TABS: 20.0000 mg | ORAL_TABLET | Freq: Every day | ORAL | Status: DC

## 2023-10-05 MED ORDER — INSULIN GLARGINE-YFGN 100 UNIT/ML ~~LOC~~ SOLN: 25.0000 [IU] | Freq: Every day | SUBCUTANEOUS | Status: DC

## 2023-10-05 MED ORDER — OLANZAPINE 5 MG PO TBDP: 5.0000 mg | ORAL_TABLET | Freq: Three times a day (TID) | ORAL | Status: DC | PRN

## 2023-10-05 MED ORDER — OLANZAPINE 5 MG PO TABS
5.0000 mg | ORAL_TABLET | Freq: Once | ORAL | Status: AC
  Administered 2023-10-05: 5 mg via ORAL
  Filled 2023-10-05: qty 1

## 2023-10-05 MED ORDER — INSULIN ASPART 100 UNIT/ML IJ SOLN
0.0000 [IU] | Freq: Three times a day (TID) | INTRAMUSCULAR | Status: DC
  Administered 2023-10-05: 5 [IU] via SUBCUTANEOUS

## 2023-10-05 NOTE — ED Notes (Signed)
 Pt is currently asleep, resp are even and unlabored. There are no apparent s/sx of distress. No further concerns at this time.

## 2023-10-05 NOTE — Progress Notes (Signed)
 LCSW Progress Note  [id number]   Maygan Koeller  10/05/2023  10:56 AM  Description:   Inpatient Psychiatric Referral  Patient was recommended inpatient per Liborio Nixon NP. There are no available beds at [facility], per [facility] RN. Patient was referred to the following out of network facilities: Destination  Service Provider Address Phone Fax  [facility] [address] [place], [place] [address] [phone]  [facility] [address] [place] [address] [phone]  [facility] [address], [place] [address] [phone]  [facility] [address], [place] [address] [phone]  [facility] [address] [place] [address] [phone]  [facility] [address], [place] [address] [phone]  [facility] EFAX [address] [place], [place] [phone]      Situation ongoing, CSW to continue following and update chart as more information becomes available.     [place] Absalom Aro, MSW, LCSW  10/05/2023 10:56 AM

## 2023-10-05 NOTE — ED Notes (Signed)
 Patient has been brought on unit and given medication, patient appears to be moderately confused, will continue to monitor patient for safety,
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
## 2023-10-05 NOTE — ED Notes (Signed)
BGL 254

## 2023-10-05 NOTE — ED Notes (Signed)
 Patient is noted banging on flex door looking for her son, this nurse and other staff redirected patient.

## 2023-10-05 NOTE — Discharge Instructions (Signed)
 Accepted to [facility]

## 2023-10-05 NOTE — ED Notes (Addendum)
 BGL 264. NP Patrice informed. Pt reports she has T1DM.NP Patrice communicating with pt's PCP to restart home meds.

## 2023-10-05 NOTE — ED Notes (Signed)
 RN was able to get EKG now. Pt able to walk there, still lethargic. NP Patrice notified.

## 2023-10-05 NOTE — ED Notes (Addendum)
 Pt being transported to accepting facility now via [facility] transport. All belongings given to transport service. Pt remains stable, calm and cooperative. No s/sx of distress. Any questions regarding transfer answered. All paperwork present in folder, also being given to transport service. No further concerns.
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
## 2023-10-05 NOTE — Progress Notes (Signed)
 Pt has been accepted to [facility] on 10/05/2023 Bed assignment: EMERSON C   Pt meets inpatient criteria per: Liborio Nixon NP  Attending Physician will be: Dr, Forrestine Him   Report can be called to: [phone]  Pt can arrive ASAP  Care Team Notified: Liborio Nixon NP, Heywood Bene RN,
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
## 2023-10-05 NOTE — ED Notes (Signed)
 SAFE transport arranged. EMTALA printed and put into folder with other important documents.
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
## 2023-10-05 NOTE — ED Notes (Signed)
 Pt called her daughter, asked this RN to inform her of transfer updates. With pt's verbal consent, RN explained where she will be transferred to. No further concerns.

## 2023-10-08 ENCOUNTER — Telehealth: Payer: Self-pay

## 2023-10-08 ENCOUNTER — Other Ambulatory Visit (HOSPITAL_COMMUNITY): Payer: Self-pay

## 2023-10-08 NOTE — Telephone Encounter (Signed)
 Pharmacy Patient Advocate Encounter   Received notification from CoverMyMeds that prior authorization for Tresiba FlexTouch (insulin degludec injection) 100 Units/mL solution is required/requested.   Insurance verification completed.   The patient is insured through [employer] .   Per test claim: PA required; PA submitted to above mentioned insurance via CoverMyMeds Key/confirmation #/EOC [id number] Status is pending

## 2023-10-09 ENCOUNTER — Other Ambulatory Visit (HOSPITAL_COMMUNITY): Payer: Self-pay

## 2023-10-09 NOTE — Telephone Encounter (Signed)
 Pharmacy Patient Advocate Encounter  Received notification from [facility] that Prior Authorization for Tresiba FlexTouch (insulin degludec injection) 100 Units/mL solution has been APPROVED from 10/08/23 to 10/07/24. Ran test claim, Copay is $4. This test claim was processed through [facility]- copay amounts may vary at other pharmacies due to pharmacy/plan contracts, or as the patient moves through the different stages of their insurance plan.   PA #/Case ID/Reference #: [id number]

## 2023-10-12 NOTE — Progress Notes (Deleted)
 S:     No chief complaint on file.  48 y.o. female who presents for diabetes evaluation, education, and management. Patient arrives in *** good spirits and presents without *** any assistance. ***Patient is accompanied by ***.   Patient transferred care from [facility] to [facility] primary care - seen by [facility] on 10/02/23 > swtiched from Novolin 70/30 to tresiba and started lisinopril-hydrochlorothiazide for HTN  Patient was referred and last seen by Primary Care Provider, Dr. ***, on ***.  *** Patient was referred by *** on ***. Patient was last seen by Primary Care Provider, Dr. ***, on ***.   PMH is significant for HTN, T2DM with hx of DKA, bipolar disorder, depression, HLD, obesity.   At last visit, ***.   Patient reports Diabetes was diagnosed in ***.   Family/Social History: ***  Current diabetes medications include: Tresiba 25 units daily Current hypertension medications include: *** Current hyperlipidemia medications include: ***  Patient reports adherence to taking all medications as prescribed.  *** Patient denies adherence with medications, reports missing *** medications *** times per week, on average.  Do you feel that your medications are working for you? {YES NO:22349} Have you been experiencing any side effects to the medications prescribed? {YES NO:22349} Do you have any problems obtaining medications due to transportation or finances? {YES [id number] Insurance coverage: ***  Patient {Actions; denies-reports:120008} hypoglycemic events.  Reported home fasting blood sugars: ***  Reported 2 hour post-meal/random blood sugars: ***.  Patient {Actions; denies-reports:120008} nocturia (nighttime urination).  Patient {Actions; denies-reports:120008} neuropathy (nerve pain). Patient {Actions; denies-reports:120008} visual changes. Patient {Actions; denies-reports:120008} self foot exams.   Patient reported dietary habits: Eats *** meals/day Breakfast:  *** Lunch: *** Dinner: *** Snacks: *** Drinks: ***  Within the past 12 months, did you worry whether your food would run out before you got money to buy more? {YES NO:22349} Within the past 12 months, did the food you bought run out, and you didn't have money to get more? {YES NO:22349} PHQ-9 Score: ***  Patient-reported exercise habits: ***   O:   ROS  Physical Exam  7 day average blood glucose: ***  Libre3 CGM Download today *** % Time CGM is active: ***% Average Glucose: *** mg/dL Glucose Management Indicator: ***  Glucose Variability: ***% (goal <36%) Time in Goal:  - Time in range 70-180: ***% - Time above range: ***% - Time below range: ***% Observed patterns:   Lab Results  Component Value Date   HGBA1C 10.9 (A) 08/23/2023   There were no vitals filed for this visit.  Lipid Panel     Component Value Date/Time   CHOL 205 (H) 10/02/2023 1509   CHOL 266 (H) 02/25/2020 0836   TRIG 73.0 10/02/2023 1509   HDL 44.30 10/02/2023 1509   HDL 51 02/25/2020 0836   CHOLHDL 5 10/02/2023 1509   VLDL 14.6 10/02/2023 1509   LDLCALC 146 (H) 10/02/2023 1509   LDLCALC 191 (H) 02/25/2020 0836    Clinical Atherosclerotic Cardiovascular Disease (ASCVD): {YES/NO:21197} The 10-year ASCVD risk score (Arnett DK, et al., 2019) is: 12.2%   Values used to calculate the score:     Age: 63 years     Sex: Female     Is Non-Hispanic African American: Yes     Diabetic: Yes     Tobacco smoker: No     Systolic Blood Pressure: 138 mmHg     Is BP treated: Yes     HDL Cholesterol: 44.3 mg/dL  Total Cholesterol: 205 mg/dL   Patient is participating in a Managed Medicaid Plan:  {MM YES/NO:27447::"Yes"}   A/P: Diabetes longstanding *** currently ***. Patient is *** able to verbalize appropriate hypoglycemia management plan. Medication adherence appears ***. Control is suboptimal due to ***. -{Meds adjust:18428} basal insulin *** Lantus/Basaglar/Semglee (insulin glargine) ***  Tresiba (insulin degludec) from *** units to *** units daily in the morning. Patient will continue to titrate 1 unit every *** days if fasting blood sugar > 100mg /dl until fasting blood sugars reach goal or next visit.  -{Meds adjust:18428} rapid insulin *** Novolog (insulin aspart) *** Humalog (insulin lispro) from *** to ***.  -{Meds adjust:18428} GLP-1 *** Trulicity (dulaglutide) *** Ozempic (semaglutide) *** Mounjaro (tirzepatide) from *** mg to *** mg .  -{Meds adjust:18428} SGLT2-I *** Farxiga (dapagliflozin) *** Jardiance (empagliflozin) 10 mg. Counseled on sick day rules. -{Meds adjust:18428} metformin ***.  -Patient educated on purpose, proper use, and potential adverse effects of ***.  -Extensively discussed pathophysiology of diabetes, recommended lifestyle interventions, dietary effects on blood sugar control.  -Counseled on s/sx of and management of hypoglycemia.  -Next A1c anticipated ***.   ASCVD risk - primary ***secondary prevention in patient with diabetes. Last LDL is *** not at goal of <13 *** mg/dL. ASCVD risk factors include *** and 10-year ASCVD risk score of ***. {Desc; low/moderate/high:110033} intensity statin indicated.  -{Meds adjust:18428} ***statin *** mg.   Hypertension longstanding *** currently ***. Blood pressure goal of <130/80 *** mmHg. Medication adherence ***. Blood pressure control is suboptimal due to ***. -{Meds adjust:18428} *** mg.  Written patient instructions provided. Patient verbalized understanding of treatment plan.  Total time in face to face counseling *** minutes.    Follow-up:  Pharmacist *** PCP clinic visit in *** Patient seen with ***

## 2023-10-15 ENCOUNTER — Ambulatory Visit: Admitting: Pharmacist

## 2023-10-16 ENCOUNTER — Ambulatory Visit: Admitting: Urgent Care

## 2023-10-29 NOTE — Telephone Encounter (Signed)
 Provider resulted labs; please advise if there is anything more I assist with.

## 2023-10-30 ENCOUNTER — Encounter: Payer: Self-pay | Admitting: Urgent Care

## 2023-11-07 ENCOUNTER — Telehealth: Payer: Self-pay

## 2023-11-07 ENCOUNTER — Ambulatory Visit (HOSPITAL_BASED_OUTPATIENT_CLINIC_OR_DEPARTMENT_OTHER): Admitting: Family Medicine

## 2023-11-07 NOTE — Telephone Encounter (Signed)
 Called pt to see exactly what she needed and also a fax number to send information but pts voicemail is not set up

## 2023-11-07 NOTE — Telephone Encounter (Signed)
 Copied from CRM [id number]. Topic: General - Other >> Nov 06, 2023  5:21 PM Taleah C wrote: Reason for CRM: patient called and stated that she needs her medical records sent to  [facility] for intermediate medical leave. She mentioned that she has requested for this before. Their fax number is

## 2023-11-13 ENCOUNTER — Ambulatory Visit (HOSPITAL_BASED_OUTPATIENT_CLINIC_OR_DEPARTMENT_OTHER): Admitting: Family Medicine

## 2023-11-20 ENCOUNTER — Ambulatory Visit: Payer: 59 | Admitting: Family Medicine

## 2023-12-11 ENCOUNTER — Emergency Department (HOSPITAL_COMMUNITY)

## 2023-12-11 ENCOUNTER — Inpatient Hospital Stay (HOSPITAL_COMMUNITY)
Admission: EM | Admit: 2023-12-11 | Discharge: 2023-12-14 | DRG: 637 | Disposition: A | Discharge status: Other Acute Inpatient Hospital | Payer: MEDICAID | Attending: Student | Admitting: Student

## 2023-12-11 ENCOUNTER — Encounter (HOSPITAL_COMMUNITY): Payer: Self-pay

## 2023-12-11 ENCOUNTER — Other Ambulatory Visit: Payer: Self-pay

## 2023-12-11 DIAGNOSIS — Z79899 Other long term (current) drug therapy: Secondary | ICD-10-CM

## 2023-12-11 DIAGNOSIS — Z9049 Acquired absence of other specified parts of digestive tract: Secondary | ICD-10-CM

## 2023-12-11 DIAGNOSIS — E1110 Type 2 diabetes mellitus with ketoacidosis without coma: Secondary | ICD-10-CM | POA: Diagnosis not present

## 2023-12-11 DIAGNOSIS — Z781 Physical restraint status: Secondary | ICD-10-CM

## 2023-12-11 DIAGNOSIS — R45850 Homicidal ideations: Secondary | ICD-10-CM | POA: Diagnosis present

## 2023-12-11 DIAGNOSIS — G9341 Metabolic encephalopathy: Secondary | ICD-10-CM | POA: Diagnosis present

## 2023-12-11 DIAGNOSIS — Z833 Family history of diabetes mellitus: Secondary | ICD-10-CM

## 2023-12-11 DIAGNOSIS — E1010 Type 1 diabetes mellitus with ketoacidosis without coma: Principal | ICD-10-CM | POA: Diagnosis present

## 2023-12-11 DIAGNOSIS — E559 Vitamin D deficiency, unspecified: Secondary | ICD-10-CM | POA: Diagnosis present

## 2023-12-11 DIAGNOSIS — Z794 Long term (current) use of insulin: Secondary | ICD-10-CM

## 2023-12-11 DIAGNOSIS — E66812 Obesity, class 2: Secondary | ICD-10-CM | POA: Diagnosis present

## 2023-12-11 DIAGNOSIS — I10 Essential (primary) hypertension: Secondary | ICD-10-CM | POA: Diagnosis present

## 2023-12-11 DIAGNOSIS — R45851 Suicidal ideations: Secondary | ICD-10-CM | POA: Diagnosis present

## 2023-12-11 DIAGNOSIS — F312 Bipolar disorder, current episode manic severe with psychotic features: Secondary | ICD-10-CM | POA: Diagnosis present

## 2023-12-11 DIAGNOSIS — T383X6A Underdosing of insulin and oral hypoglycemic [antidiabetic] drugs, initial encounter: Secondary | ICD-10-CM | POA: Diagnosis present

## 2023-12-11 DIAGNOSIS — Z91138 Patient's unintentional underdosing of medication regimen for other reason: Secondary | ICD-10-CM

## 2023-12-11 DIAGNOSIS — N179 Acute kidney failure, unspecified: Secondary | ICD-10-CM | POA: Diagnosis present

## 2023-12-11 DIAGNOSIS — Z87891 Personal history of nicotine dependence: Secondary | ICD-10-CM

## 2023-12-11 DIAGNOSIS — E785 Hyperlipidemia, unspecified: Secondary | ICD-10-CM | POA: Diagnosis present

## 2023-12-11 DIAGNOSIS — F3010 Manic episode without psychotic symptoms, unspecified: Secondary | ICD-10-CM

## 2023-12-11 DIAGNOSIS — Z6836 Body mass index (BMI) 36.0-36.9, adult: Secondary | ICD-10-CM

## 2023-12-11 LAB — COMPREHENSIVE METABOLIC PANEL WITH GFR
ALT: 22 U/L (ref 0–44)
AST: 25 U/L (ref 15–41)
Albumin: 4 g/dL (ref 3.5–5.0)
Alkaline Phosphatase: 92 U/L (ref 38–126)
Anion gap: 17 — ABNORMAL HIGH (ref 5–15)
BUN: 21 mg/dL — ABNORMAL HIGH (ref 6–20)
CO2: 16 mmol/L — ABNORMAL LOW (ref 22–32)
Calcium: 10.2 mg/dL (ref 8.9–10.3)
Chloride: 98 mmol/L (ref 98–111)
Creatinine, Ser: 1.28 mg/dL — ABNORMAL HIGH (ref 0.44–1.00)
GFR, Estimated: 52 mL/min — ABNORMAL LOW (ref >60)
Glucose, Bld: 521 mg/dL (ref 70–99)
Potassium: 4.4 mmol/L (ref 3.5–5.1)
Sodium: 131 mmol/L — ABNORMAL LOW (ref 135–145)
Total Bilirubin: 2 mg/dL — ABNORMAL HIGH (ref 0.0–1.2)
Total Protein: 7.5 g/dL (ref 6.5–8.1)

## 2023-12-11 LAB — CBC WITH DIFFERENTIAL/PLATELET
Abs Immature Granulocytes: 0.03 10*3/uL (ref 0.00–0.07)
Basophils Absolute: 0.1 10*3/uL (ref 0.0–0.1)
Basophils Relative: 1 %
Eosinophils Absolute: 0 10*3/uL (ref 0.0–0.5)
Eosinophils Relative: 0 %
HCT: 39 % (ref 36.0–46.0)
Hemoglobin: 12.7 g/dL (ref 12.0–15.0)
Immature Granulocytes: 0 %
Lymphocytes Relative: 10 %
Lymphs Abs: 1.2 10*3/uL (ref 0.7–4.0)
MCH: 26.5 pg (ref 26.0–34.0)
MCHC: 32.6 g/dL (ref 30.0–36.0)
MCV: 81.3 fL (ref 80.0–100.0)
Monocytes Absolute: 0.6 10*3/uL (ref 0.1–1.0)
Monocytes Relative: 5 %
Neutro Abs: 9.9 10*3/uL — ABNORMAL HIGH (ref 1.7–7.7)
Neutrophils Relative %: 84 %
Platelets: 296 10*3/uL (ref 150–400)
RBC: 4.8 MIL/uL (ref 3.87–5.11)
RDW: 15.1 % (ref 11.5–15.5)
WBC: 11.9 10*3/uL — ABNORMAL HIGH (ref 4.0–10.5)
nRBC: 0 % (ref 0.0–0.2)

## 2023-12-11 LAB — CBG MONITORING, ED
Glucose-Capillary: 126 mg/dL — ABNORMAL HIGH (ref 70–99)
Glucose-Capillary: 135 mg/dL — ABNORMAL HIGH (ref 70–99)
Glucose-Capillary: 172 mg/dL — ABNORMAL HIGH (ref 70–99)
Glucose-Capillary: 181 mg/dL — ABNORMAL HIGH (ref 70–99)
Glucose-Capillary: 189 mg/dL — ABNORMAL HIGH (ref 70–99)
Glucose-Capillary: 211 mg/dL — ABNORMAL HIGH (ref 70–99)
Glucose-Capillary: 224 mg/dL — ABNORMAL HIGH (ref 70–99)
Glucose-Capillary: 226 mg/dL — ABNORMAL HIGH (ref 70–99)
Glucose-Capillary: 232 mg/dL — ABNORMAL HIGH (ref 70–99)
Glucose-Capillary: 274 mg/dL — ABNORMAL HIGH (ref 70–99)
Glucose-Capillary: 332 mg/dL — ABNORMAL HIGH (ref 70–99)
Glucose-Capillary: 407 mg/dL — ABNORMAL HIGH (ref 70–99)
Glucose-Capillary: 562 mg/dL (ref 70–99)

## 2023-12-11 LAB — I-STAT VENOUS BLOOD GAS, ED
Acid-base deficit: 3 mmol/L — ABNORMAL HIGH (ref 0.0–2.0)
Bicarbonate: 19.5 mmol/L — ABNORMAL LOW (ref 20.0–28.0)
Calcium, Ion: 1.14 mmol/L — ABNORMAL LOW (ref 1.15–1.40)
HCT: 35 % — ABNORMAL LOW (ref 36.0–46.0)
Hemoglobin: 11.9 g/dL — ABNORMAL LOW (ref 12.0–15.0)
O2 Saturation: 92 %
Potassium: 4.1 mmol/L (ref 3.5–5.1)
Sodium: 133 mmol/L — ABNORMAL LOW (ref 135–145)
TCO2: 20 mmol/L — ABNORMAL LOW (ref 22–32)
pCO2, Ven: 27.1 mmHg — ABNORMAL LOW (ref 44–60)
pH, Ven: 7.466 — ABNORMAL HIGH (ref 7.25–7.43)
pO2, Ven: 59 mmHg — ABNORMAL HIGH (ref 32–45)

## 2023-12-11 LAB — BASIC METABOLIC PANEL WITH GFR
Anion gap: 15 (ref 5–15)
Anion gap: 9 (ref 5–15)
BUN: 22 mg/dL — ABNORMAL HIGH (ref 6–20)
BUN: 20 mg/dL (ref 6–20)
CO2: 21 mmol/L — ABNORMAL LOW (ref 22–32)
CO2: 24 mmol/L (ref 22–32)
Calcium: 10.3 mg/dL (ref 8.9–10.3)
Calcium: 9.6 mg/dL (ref 8.9–10.3)
Chloride: 101 mmol/L (ref 98–111)
Chloride: 103 mmol/L (ref 98–111)
Creatinine, Ser: 1.21 mg/dL — ABNORMAL HIGH (ref 0.44–1.00)
Creatinine, Ser: 0.93 mg/dL (ref 0.44–1.00)
GFR, Estimated: 56 mL/min — ABNORMAL LOW (ref >60)
GFR, Estimated: >60 mL/min (ref >60)
Glucose, Bld: 192 mg/dL — ABNORMAL HIGH (ref 70–99)
Glucose, Bld: 158 mg/dL — ABNORMAL HIGH (ref 70–99)
Potassium: 3.9 mmol/L (ref 3.5–5.1)
Potassium: 3.5 mmol/L (ref 3.5–5.1)
Sodium: 137 mmol/L (ref 135–145)
Sodium: 136 mmol/L (ref 135–145)

## 2023-12-11 LAB — GLUCOSE, CAPILLARY
Glucose-Capillary: 183 mg/dL — ABNORMAL HIGH (ref 70–99)
Glucose-Capillary: 200 mg/dL — ABNORMAL HIGH (ref 70–99)
Glucose-Capillary: 296 mg/dL — ABNORMAL HIGH (ref 70–99)
Glucose-Capillary: 315 mg/dL — ABNORMAL HIGH (ref 70–99)

## 2023-12-11 LAB — CBC
HCT: 37.3 % (ref 36.0–46.0)
Hemoglobin: 12.2 g/dL (ref 12.0–15.0)
MCH: 26 pg (ref 26.0–34.0)
MCHC: 32.7 g/dL (ref 30.0–36.0)
MCV: 79.4 fL — ABNORMAL LOW (ref 80.0–100.0)
Platelets: 290 10*3/uL (ref 150–400)
RBC: 4.7 MIL/uL (ref 3.87–5.11)
RDW: 15.1 % (ref 11.5–15.5)
WBC: 9.8 10*3/uL (ref 4.0–10.5)
nRBC: 0 % (ref 0.0–0.2)

## 2023-12-11 LAB — RAPID URINE DRUG SCREEN, HOSP PERFORMED
Amphetamines: NOT DETECTED
Barbiturates: NOT DETECTED
Benzodiazepines: NOT DETECTED
Cocaine: NOT DETECTED
Opiates: NOT DETECTED
Tetrahydrocannabinol: POSITIVE — AB

## 2023-12-11 LAB — URINALYSIS, ROUTINE W REFLEX MICROSCOPIC
Bilirubin Urine: NEGATIVE
Glucose, UA: >=500 mg/dL — AB
Hgb urine dipstick: NEGATIVE
Ketones, ur: 20 mg/dL — AB
Leukocytes,Ua: NEGATIVE
Nitrite: NEGATIVE
Protein, ur: 30 mg/dL — AB
Specific Gravity, Urine: 1.023 (ref 1.005–1.030)
pH: 5 (ref 5.0–8.0)

## 2023-12-11 LAB — TROPONIN I (HIGH SENSITIVITY)
Troponin I (High Sensitivity): 9 ng/L (ref <18)
Troponin I (High Sensitivity): 9 ng/L (ref <18)

## 2023-12-11 LAB — CREATININE, SERUM
Creatinine, Ser: 0.93 mg/dL (ref 0.44–1.00)
GFR, Estimated: >60 mL/min (ref >60)

## 2023-12-11 LAB — BETA-HYDROXYBUTYRIC ACID
Beta-Hydroxybutyric Acid: 1.46 mmol/L — ABNORMAL HIGH (ref 0.05–0.27)
Beta-Hydroxybutyric Acid: 0.49 mmol/L — ABNORMAL HIGH (ref 0.05–0.27)
Beta-Hydroxybutyric Acid: 2.4 mmol/L — ABNORMAL HIGH (ref 0.05–0.27)
Beta-Hydroxybutyric Acid: 1.27 mmol/L — ABNORMAL HIGH (ref 0.05–0.27)

## 2023-12-11 LAB — MAGNESIUM: Magnesium: 1.9 mg/dL (ref 1.7–2.4)

## 2023-12-11 LAB — HEMOGLOBIN A1C
Hgb A1c MFr Bld: 10.9 % — ABNORMAL HIGH (ref 4.8–5.6)
Mean Plasma Glucose: 266 mg/dL

## 2023-12-11 LAB — SALICYLATE LEVEL: Salicylate Lvl: <7 mg/dL — ABNORMAL LOW (ref 7.0–30.0)

## 2023-12-11 LAB — HIV ANTIBODY (ROUTINE TESTING W REFLEX): HIV Screen 4th Generation wRfx: NONREACTIVE

## 2023-12-11 LAB — ACETAMINOPHEN LEVEL: Acetaminophen (Tylenol), Serum: <10 ug/mL — ABNORMAL LOW (ref 10–30)

## 2023-12-11 LAB — ETHANOL: Alcohol, Ethyl (B): <15 mg/dL (ref <15)

## 2023-12-11 MED ORDER — HALOPERIDOL 0.5 MG PO TABS
0.5000 mg | ORAL_TABLET | Freq: Every day | ORAL | Status: DC
  Administered 2023-12-11: 0.5 mg via ORAL
  Filled 2023-12-11: qty 1

## 2023-12-11 MED ORDER — INSULIN ASPART 100 UNIT/ML IJ SOLN
10.0000 [IU] | Freq: Once | INTRAMUSCULAR | Status: AC
  Administered 2023-12-11: 10 [IU] via SUBCUTANEOUS

## 2023-12-11 MED ORDER — MELATONIN 5 MG PO TABS
5.0000 mg | ORAL_TABLET | Freq: Every day | ORAL | Status: DC
  Administered 2023-12-11 – 2023-12-13 (×3): 5 mg via ORAL
  Filled 2023-12-11 (×3): qty 1

## 2023-12-11 MED ORDER — ZIPRASIDONE MESYLATE 20 MG IM SOLR
20.0000 mg | Freq: Once | INTRAMUSCULAR | Status: AC
  Administered 2023-12-11: 20 mg via INTRAMUSCULAR
  Filled 2023-12-11: qty 20

## 2023-12-11 MED ORDER — PAROXETINE HCL 20 MG PO TABS
20.0000 mg | ORAL_TABLET | Freq: Every day | ORAL | Status: DC
  Administered 2023-12-11 – 2023-12-12 (×2): 20 mg via ORAL
  Filled 2023-12-11 (×2): qty 1

## 2023-12-11 MED ORDER — LACTATED RINGERS IV BOLUS
1000.0000 mL | Freq: Once | INTRAVENOUS | Status: AC
  Administered 2023-12-11: 1000 mL via INTRAVENOUS

## 2023-12-11 MED ORDER — BENZTROPINE MESYLATE 1 MG PO TABS
1.0000 mg | ORAL_TABLET | Freq: Two times a day (BID) | ORAL | Status: DC
  Administered 2023-12-11 – 2023-12-14 (×6): 1 mg via ORAL
  Filled 2023-12-11 (×7): qty 1

## 2023-12-11 MED ORDER — ONDANSETRON HCL 4 MG/2ML IJ SOLN: 4.0000 mg | Freq: Four times a day (QID) | INTRAMUSCULAR | Status: DC | PRN

## 2023-12-11 MED ORDER — SODIUM CHLORIDE 0.9 % IV SOLN: INTRAVENOUS | Status: AC

## 2023-12-11 MED ORDER — LORAZEPAM 2 MG/ML IJ SOLN
1.0000 mg | Freq: Once | INTRAMUSCULAR | Status: AC
  Administered 2023-12-11: 1 mg via INTRAVENOUS
  Filled 2023-12-11: qty 1

## 2023-12-11 MED ORDER — HYDROXYZINE HCL 25 MG PO TABS: 25.0000 mg | ORAL_TABLET | Freq: Three times a day (TID) | ORAL | Status: DC | PRN

## 2023-12-11 MED ORDER — INSULIN REGULAR(HUMAN) IN NACL 100-0.9 UT/100ML-% IV SOLN
INTRAVENOUS | Status: DC
  Administered 2023-12-11: 10.5 [IU]/h via INTRAVENOUS
  Administered 2023-12-11: 0.5 [IU]/h via INTRAVENOUS
  Filled 2023-12-11: qty 100

## 2023-12-11 MED ORDER — LORAZEPAM 1 MG PO TABS: 1.0000 mg | ORAL_TABLET | Freq: Once | ORAL | Status: DC

## 2023-12-11 MED ORDER — ACETAMINOPHEN 650 MG RE SUPP: 650.0000 mg | Freq: Four times a day (QID) | RECTAL | Status: DC | PRN

## 2023-12-11 MED ORDER — ONDANSETRON HCL 4 MG/2ML IJ SOLN: 4.0000 mg | Freq: Three times a day (TID) | INTRAMUSCULAR | Status: DC | PRN

## 2023-12-11 MED ORDER — ACETAMINOPHEN 325 MG PO TABS: 650.0000 mg | ORAL_TABLET | Freq: Four times a day (QID) | ORAL | Status: DC | PRN

## 2023-12-11 MED ORDER — MELATONIN 5 MG PO TABS: 5.0000 mg | ORAL_TABLET | Freq: Every evening | ORAL | Status: DC | PRN

## 2023-12-11 MED ORDER — INSULIN GLARGINE-YFGN 100 UNIT/ML ~~LOC~~ SOLN
20.0000 [IU] | Freq: Every day | SUBCUTANEOUS | Status: DC
  Administered 2023-12-11: 20 [IU] via SUBCUTANEOUS
  Filled 2023-12-11 (×2): qty 0.2

## 2023-12-11 MED ORDER — DEXTROSE IN LACTATED RINGERS 5 % IV SOLN: INTRAVENOUS | Status: DC

## 2023-12-11 MED ORDER — INSULIN ASPART 100 UNIT/ML IJ SOLN
0.0000 [IU] | Freq: Three times a day (TID) | INTRAMUSCULAR | Status: DC
  Administered 2023-12-11: 3 [IU] via SUBCUTANEOUS
  Administered 2023-12-12 – 2023-12-13 (×5): 8 [IU] via SUBCUTANEOUS
  Administered 2023-12-13: 5 [IU] via SUBCUTANEOUS
  Administered 2023-12-14: 8 [IU] via SUBCUTANEOUS

## 2023-12-11 MED ORDER — ENOXAPARIN SODIUM 60 MG/0.6ML IJ SOSY
60.0000 mg | PREFILLED_SYRINGE | INTRAMUSCULAR | Status: DC
  Administered 2023-12-11 – 2023-12-13 (×2): 60 mg via SUBCUTANEOUS
  Filled 2023-12-11 (×2): qty 0.6

## 2023-12-11 MED ORDER — ONDANSETRON HCL 4 MG PO TABS
4.0000 mg | ORAL_TABLET | Freq: Four times a day (QID) | ORAL | Status: AC | PRN
Start: 2023-12-11 — End: ?

## 2023-12-11 MED ORDER — DEXTROSE 50 % IV SOLN: 0.0000 mL | INTRAVENOUS | Status: DC | PRN

## 2023-12-11 MED ORDER — ENOXAPARIN SODIUM 40 MG/0.4ML IJ SOSY
40.0000 mg | PREFILLED_SYRINGE | INTRAMUSCULAR | Status: DC
Start: 2023-12-11 — End: 2023-12-11

## 2023-12-11 NOTE — ED Notes (Signed)
 Pt won't keep her leads on. Will wait to reapply once the Geodon  kicks in a little more.

## 2023-12-11 NOTE — ED Notes (Signed)
 Pt ambulated to bathroom

## 2023-12-11 NOTE — ED Notes (Signed)
 RN attempted lab draw, no success, phlebotomy notified.

## 2023-12-11 NOTE — ED Notes (Signed)
 Pt more relaxed. No more yelling. Breathing easy/unlabored about 10-12. Able to place the monitor back on.  Airway intact.

## 2023-12-11 NOTE — ED Notes (Signed)
 Pt yelling and pulling off wires. Provider to bedside. Will give geodon  as ordered

## 2023-12-11 NOTE — ED Notes (Signed)
 Provider made aware of patient's difficulty retaining instructions and repetitive questioning.
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
## 2023-12-11 NOTE — ED Notes (Signed)
 Pt ambulated to the bathroom.
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
## 2023-12-12 DIAGNOSIS — Z781 Physical restraint status: Secondary | ICD-10-CM | POA: Diagnosis not present

## 2023-12-12 DIAGNOSIS — Z833 Family history of diabetes mellitus: Secondary | ICD-10-CM | POA: Diagnosis not present

## 2023-12-12 DIAGNOSIS — N179 Acute kidney failure, unspecified: Secondary | ICD-10-CM | POA: Diagnosis present

## 2023-12-12 DIAGNOSIS — R45850 Homicidal ideations: Secondary | ICD-10-CM | POA: Diagnosis present

## 2023-12-12 DIAGNOSIS — F3010 Manic episode without psychotic symptoms, unspecified: Secondary | ICD-10-CM

## 2023-12-12 DIAGNOSIS — Z87891 Personal history of nicotine dependence: Secondary | ICD-10-CM | POA: Diagnosis not present

## 2023-12-12 DIAGNOSIS — E559 Vitamin D deficiency, unspecified: Secondary | ICD-10-CM | POA: Diagnosis present

## 2023-12-12 DIAGNOSIS — G9341 Metabolic encephalopathy: Secondary | ICD-10-CM | POA: Diagnosis present

## 2023-12-12 DIAGNOSIS — Z6836 Body mass index (BMI) 36.0-36.9, adult: Secondary | ICD-10-CM | POA: Diagnosis not present

## 2023-12-12 DIAGNOSIS — E66812 Obesity, class 2: Secondary | ICD-10-CM | POA: Diagnosis present

## 2023-12-12 DIAGNOSIS — I10 Essential (primary) hypertension: Secondary | ICD-10-CM

## 2023-12-12 DIAGNOSIS — R4182 Altered mental status, unspecified: Secondary | ICD-10-CM | POA: Diagnosis present

## 2023-12-12 DIAGNOSIS — E1110 Type 2 diabetes mellitus with ketoacidosis without coma: Secondary | ICD-10-CM | POA: Diagnosis not present

## 2023-12-12 DIAGNOSIS — F312 Bipolar disorder, current episode manic severe with psychotic features: Secondary | ICD-10-CM | POA: Diagnosis present

## 2023-12-12 DIAGNOSIS — Z79899 Other long term (current) drug therapy: Secondary | ICD-10-CM | POA: Diagnosis not present

## 2023-12-12 DIAGNOSIS — Z794 Long term (current) use of insulin: Secondary | ICD-10-CM | POA: Diagnosis not present

## 2023-12-12 DIAGNOSIS — E785 Hyperlipidemia, unspecified: Secondary | ICD-10-CM | POA: Diagnosis present

## 2023-12-12 DIAGNOSIS — Z9049 Acquired absence of other specified parts of digestive tract: Secondary | ICD-10-CM | POA: Diagnosis not present

## 2023-12-12 DIAGNOSIS — R45851 Suicidal ideations: Secondary | ICD-10-CM | POA: Diagnosis present

## 2023-12-12 DIAGNOSIS — E1010 Type 1 diabetes mellitus with ketoacidosis without coma: Secondary | ICD-10-CM | POA: Diagnosis present

## 2023-12-12 DIAGNOSIS — T383X6A Underdosing of insulin and oral hypoglycemic [antidiabetic] drugs, initial encounter: Secondary | ICD-10-CM | POA: Diagnosis present

## 2023-12-12 DIAGNOSIS — Z91138 Patient's unintentional underdosing of medication regimen for other reason: Secondary | ICD-10-CM | POA: Diagnosis not present

## 2023-12-12 LAB — CBC
HCT: 34.9 % — ABNORMAL LOW (ref 36.0–46.0)
Hemoglobin: 11.5 g/dL — ABNORMAL LOW (ref 12.0–15.0)
MCH: 26.3 pg (ref 26.0–34.0)
MCHC: 33 g/dL (ref 30.0–36.0)
MCV: 79.9 fL — ABNORMAL LOW (ref 80.0–100.0)
Platelets: 269 10*3/uL (ref 150–400)
RBC: 4.37 MIL/uL (ref 3.87–5.11)
RDW: 15 % (ref 11.5–15.5)
WBC: 8.9 10*3/uL (ref 4.0–10.5)
nRBC: 0 % (ref 0.0–0.2)

## 2023-12-12 LAB — GLUCOSE, CAPILLARY
Glucose-Capillary: 297 mg/dL — ABNORMAL HIGH (ref 70–99)
Glucose-Capillary: 286 mg/dL — ABNORMAL HIGH (ref 70–99)
Glucose-Capillary: 287 mg/dL — ABNORMAL HIGH (ref 70–99)
Glucose-Capillary: 217 mg/dL — ABNORMAL HIGH (ref 70–99)

## 2023-12-12 LAB — BASIC METABOLIC PANEL WITH GFR
Anion gap: 10 (ref 5–15)
BUN: 16 mg/dL (ref 6–20)
CO2: 21 mmol/L — ABNORMAL LOW (ref 22–32)
Calcium: 9.4 mg/dL (ref 8.9–10.3)
Chloride: 100 mmol/L (ref 98–111)
Creatinine, Ser: 0.81 mg/dL (ref 0.44–1.00)
GFR, Estimated: >60 mL/min (ref >60)
Glucose, Bld: 281 mg/dL — ABNORMAL HIGH (ref 70–99)
Potassium: 3.4 mmol/L — ABNORMAL LOW (ref 3.5–5.1)
Sodium: 131 mmol/L — ABNORMAL LOW (ref 135–145)

## 2023-12-12 LAB — MAGNESIUM: Magnesium: 1.7 mg/dL (ref 1.7–2.4)

## 2023-12-12 MED ORDER — DIPHENHYDRAMINE HCL 25 MG PO CAPS
50.0000 mg | ORAL_CAPSULE | Freq: Four times a day (QID) | ORAL | Status: DC | PRN
  Administered 2023-12-12 – 2023-12-13 (×2): 50 mg via ORAL
  Filled 2023-12-12 (×2): qty 2

## 2023-12-12 MED ORDER — INSULIN ASPART 100 UNIT/ML IJ SOLN
5.0000 [IU] | Freq: Once | INTRAMUSCULAR | Status: AC
  Administered 2023-12-12: 5 [IU] via SUBCUTANEOUS

## 2023-12-12 MED ORDER — INSULIN GLARGINE-YFGN 100 UNIT/ML ~~LOC~~ SOLN
25.0000 [IU] | Freq: Every day | SUBCUTANEOUS | Status: DC
  Administered 2023-12-12: 25 [IU] via SUBCUTANEOUS
  Filled 2023-12-12 (×2): qty 0.25

## 2023-12-12 MED ORDER — INSULIN ASPART 100 UNIT/ML IJ SOLN
4.0000 [IU] | Freq: Three times a day (TID) | INTRAMUSCULAR | Status: DC
  Administered 2023-12-12 – 2023-12-14 (×6): 4 [IU] via SUBCUTANEOUS

## 2023-12-12 MED ORDER — LISINOPRIL 20 MG PO TABS
20.0000 mg | ORAL_TABLET | Freq: Every day | ORAL | Status: DC
  Administered 2023-12-12 – 2023-12-14 (×3): 20 mg via ORAL
  Filled 2023-12-12 (×3): qty 1

## 2023-12-12 MED ORDER — HALOPERIDOL 5 MG PO TABS
5.0000 mg | ORAL_TABLET | Freq: Every day | ORAL | Status: DC
  Filled 2023-12-12: qty 1

## 2023-12-12 MED ORDER — HALOPERIDOL 5 MG PO TABS
5.0000 mg | ORAL_TABLET | Freq: Two times a day (BID) | ORAL | Status: DC
  Administered 2023-12-12 – 2023-12-14 (×4): 5 mg via ORAL
  Filled 2023-12-12 (×5): qty 1

## 2023-12-12 MED ORDER — ZIPRASIDONE MESYLATE 20 MG IM SOLR
20.0000 mg | Freq: Two times a day (BID) | INTRAMUSCULAR | Status: DC | PRN
  Administered 2023-12-12: 20 mg via INTRAMUSCULAR
  Filled 2023-12-12 (×3): qty 20

## 2023-12-12 MED ORDER — HALOPERIDOL LACTATE 5 MG/ML IJ SOLN
5.0000 mg | Freq: Four times a day (QID) | INTRAMUSCULAR | Status: DC | PRN
  Administered 2023-12-12: 5 mg via INTRAMUSCULAR
  Filled 2023-12-12: qty 1

## 2023-12-12 MED ORDER — LORAZEPAM 2 MG/ML IJ SOLN
2.0000 mg | Freq: Four times a day (QID) | INTRAMUSCULAR | Status: DC | PRN
  Administered 2023-12-12: 2 mg via INTRAVENOUS

## 2023-12-12 MED ORDER — HALOPERIDOL 5 MG PO TABS: 5.0000 mg | ORAL_TABLET | Freq: Four times a day (QID) | ORAL | Status: DC | PRN

## 2023-12-12 MED ORDER — LORAZEPAM 2 MG/ML IJ SOLN: 1.0000 mg | Freq: Four times a day (QID) | INTRAMUSCULAR | Status: DC | PRN

## 2023-12-12 MED ORDER — LORAZEPAM 1 MG PO TABS
1.0000 mg | ORAL_TABLET | Freq: Four times a day (QID) | ORAL | Status: DC | PRN
  Filled 2023-12-12: qty 1

## 2023-12-12 MED ORDER — LORAZEPAM 1 MG PO TABS
1.0000 mg | ORAL_TABLET | Freq: Four times a day (QID) | ORAL | Status: DC | PRN
  Administered 2023-12-13: 1 mg via ORAL
  Filled 2023-12-12: qty 1

## 2023-12-12 NOTE — Plan of Care (Signed)
  Problem: Education: Goal: Ability to describe self-care measures that may prevent or decrease complications (Diabetes Survival Skills Education) will improve Outcome: Progressing Goal: Individualized Educational Video(s) Outcome: Progressing   Problem: Coping: Goal: Ability to adjust to condition or change in health will improve Outcome: Progressing   Problem: Fluid Volume: Goal: Ability to maintain a balanced intake and output will improve Outcome: Progressing   Problem: Health Behavior/Discharge Planning: Goal: Ability to identify and utilize available resources and services will improve Outcome: Progressing Goal: Ability to manage health-related needs will improve Outcome: Progressing   Problem: Metabolic: Goal: Ability to maintain appropriate glucose levels will improve Outcome: Progressing   Problem: Nutritional: Goal: Maintenance of adequate nutrition will improve Outcome: Progressing Goal: Progress toward achieving an optimal weight will improve Outcome: Progressing   Problem: Skin Integrity: Goal: Risk for impaired skin integrity will decrease Outcome: Progressing   Problem: Tissue Perfusion: Goal: Adequacy of tissue perfusion will improve Outcome: Progressing   Problem: Education: Goal: Ability to describe self-care measures that may prevent or decrease complications (Diabetes Survival Skills Education) will improve Outcome: Progressing Goal: Individualized Educational Video(s) Outcome: Progressing   Problem: Cardiac: Goal: Ability to maintain an adequate cardiac output will improve Outcome: Progressing   Problem: Health Behavior/Discharge Planning: Goal: Ability to identify and utilize available resources and services will improve Outcome: Progressing Goal: Ability to manage health-related needs will improve Outcome: Progressing   Problem: Fluid Volume: Goal: Ability to achieve a balanced intake and output will improve Outcome: Progressing    Problem: Metabolic: Goal: Ability to maintain appropriate glucose levels will improve Outcome: Progressing   Problem: Nutritional: Goal: Maintenance of adequate nutrition will improve Outcome: Progressing Goal: Maintenance of adequate weight for body size and type will improve Outcome: Progressing   Problem: Respiratory: Goal: Will regain and/or maintain adequate ventilation Outcome: Progressing   Problem: Urinary Elimination: Goal: Ability to achieve and maintain adequate renal perfusion and functioning will improve Outcome: Progressing   Problem: Education: Goal: Knowledge of General Education information will improve Description: Including pain rating scale, medication(s)/side effects and non-pharmacologic comfort measures Outcome: Progressing   Problem: Health Behavior/Discharge Planning: Goal: Ability to manage health-related needs will improve Outcome: Progressing   Problem: Clinical Measurements: Goal: Ability to maintain clinical measurements within normal limits will improve Outcome: Progressing Goal: Will remain free from infection Outcome: Progressing Goal: Diagnostic test results will improve Outcome: Progressing Goal: Respiratory complications will improve Outcome: Progressing Goal: Cardiovascular complication will be avoided Outcome: Progressing   Problem: Activity: Goal: Risk for activity intolerance will decrease Outcome: Progressing   Problem: Nutrition: Goal: Adequate nutrition will be maintained Outcome: Progressing   Problem: Coping: Goal: Level of anxiety will decrease Outcome: Progressing   Problem: Elimination: Goal: Will not experience complications related to bowel motility Outcome: Progressing Goal: Will not experience complications related to urinary retention Outcome: Progressing   Problem: Pain Managment: Goal: General experience of comfort will improve and/or be controlled Outcome: Progressing   Problem: Safety: Goal:  Ability to remain free from injury will improve Outcome: Progressing   Problem: Skin Integrity: Goal: Risk for impaired skin integrity will decrease Outcome: Progressing   Problem: Safety: Goal: Violent Restraint(s) Outcome: Progressing

## 2023-12-12 NOTE — Progress Notes (Addendum)
 PROGRESS NOTE        PATIENT DETAILS Name: [name] Age: 48 y.o. Sex: female Date of Birth: 06/20/1976 Admit Date: 12/11/2023 Admitting Physician Magdalene School, MD LOV:FIEPP, Driscilla George, PA  Brief Summary: Patient is a 48 y.o.  female with history of bipolar disorder/schizoaffective disorder, DM-2-who apparently ran out of insulin  2-3 weeks back-brought in with acute metabolic encephalopathy and DKA.  Significant events: 6/10>> admit to [facility]  Significant studies: 6/10>> CT head: No acute abnormality 6/10>> CXR: No pneumonia.  Significant microbiology data: None  Procedures: None  Consults: None  Subjective: Awake-relatively alert-answer simple questions appropriately but in between-acting erratically-and blowing kisses-giving salutes-some pressured speech.  Objective: Vitals: Blood pressure (!) 137/90, pulse 96, temperature 97.9 F (36.6 C), temperature source Oral, resp. rate (!) 23, height 5' 11 (1.803 m), weight 117.4 kg, SpO2 100%.   Exam: Gen Exam:Alert awake-not in any distress HEENT:atraumatic, normocephalic Chest: B/L clear to auscultation anteriorly CVS:S1S2 regular Abdomen:soft non tender, non distended Extremities:no edema Neurology: Non focal Skin: no rash  Pertinent Labs/Radiology:    Latest Ref Rng & Units 12/11/2023    5:44 PM 12/11/2023    2:00 AM 12/11/2023   12:27 AM  CBC  WBC 4.0 - 10.5 K/uL 9.8   11.9   Hemoglobin 12.0 - 15.0 g/dL 29.5  18.8  41.6   Hematocrit 36.0 - 46.0 % 37.3  35.0  39.0   Platelets 150 - 400 K/uL 290   296     Lab Results  Component Value Date   NA 136 12/11/2023   K 3.5 12/11/2023   CL 103 12/11/2023   CO2 24 12/11/2023      Assessment/Plan: Acute metabolic encephalopathy Secondary to DKA Agitated on initial arrival to ED-now much, but still behaving erratically-which probably is secondary to her psych issues   DKA Resolved with IVF/IV insulin  Likely secondary to running out of  insulin  2-3 weeks back.  DM-2 (A1c 10.9 on 6/10) with uncontrolled hyperglycemia CBGs still on the higher side Increase Semglee  to 25 units Add 4 units of NovoLog  with meals Continue SSI Follow/optimize  History of bipolar disorder/schizoaffective disorder She was significantly agitated on initial presentation-this has resolved However with erratic behavior this a.m.-blowing Armed forces training and education officer style salutes in the room. Spoke with sister-this is not her baseline-unclear if she also ran out of some of her psych meds in addition to insulin . Will get psychiatry input Continue Cogentin /Haldol /Paxil  Await recommendations from psychiatry.  Addendum Medically stable to be transferred to inpatient psych when bed available.  HTN Resume lisinopril   Class 2 Obesity Estimated body mass index is 36.1 kg/m as calculated from the following:   Height as of this encounter: 5' 11 (1.803 m).   Weight as of this encounter: 117.4 kg.   Code status:   Code Status: Full Code   DVT Prophylaxis: SQ Lovenox    Family Communication: Sister Natalene [phone]-updated 6/11.   Disposition Plan: Status is: Observation The patient will require care spanning > 2 midnights and should be moved to inpatient because: Severity of illness   Planned Discharge Destination:Home   Diet: Diet Order             Diet Carb Modified Fluid consistency: Thin; Room service appropriate? Yes  Diet effective now  Antimicrobial agents: Anti-infectives (From admission, onward)    None        MEDICATIONS: Scheduled Meds:  benztropine   1 mg Oral BID   enoxaparin  (LOVENOX ) injection  60 mg Subcutaneous Q24H   haloperidol   0.5 mg Oral QHS   insulin  aspart  0-15 Units Subcutaneous TID WC   insulin  glargine-yfgn  20 Units Subcutaneous Daily   melatonin  5 mg Oral QHS   PARoxetine   20 mg Oral Daily   Continuous Infusions: PRN Meds:.acetaminophen  **OR** acetaminophen ,  dextrose , hydrOXYzine , ondansetron  **OR** ondansetron  (ZOFRAN ) IV   I have personally reviewed following labs and imaging studies  LABORATORY DATA: CBC: Recent Labs  Lab 12/11/23 0027 12/11/23 0200 12/11/23 1744  WBC 11.9*  --  9.8  NEUTROABS 9.9*  --   --   HGB 12.7 11.9* 12.2  HCT 39.0 35.0* 37.3  MCV 81.3  --  79.4*  PLT 296  --  290    Basic Metabolic Panel: Recent Labs  Lab 12/11/23 0027 12/11/23 0200 12/11/23 0321 12/11/23 1032 12/11/23 1455 12/11/23 1744  NA 131* 133* 137  --  136  --   K 4.4 4.1 3.9  --  3.5  --   CL 98  --  101  --  103  --   CO2 16*  --  21*  --  24  --   GLUCOSE 521*  --  192*  --  158*  --   BUN 21*  --  22*  --  20  --   CREATININE 1.28*  --  1.21*  --  0.93 0.93  CALCIUM  10.2  --  10.3  --  9.6  --   MG  --   --   --  1.9  --   --     GFR: Estimated Creatinine Clearance: 106.7 mL/min (by C-G formula based on SCr of 0.93 mg/dL).  Liver Function Tests: Recent Labs  Lab 12/11/23 0027  AST 25  ALT 22  ALKPHOS 92  BILITOT 2.0*  PROT 7.5  ALBUMIN 4.0   No results for input(s): LIPASE, AMYLASE in the last 168 hours. No results for input(s): AMMONIA in the last 168 hours.  Coagulation Profile: No results for input(s): INR, PROTIME in the last 168 hours.  Cardiac Enzymes: No results for input(s): CKTOTAL, CKMB, CKMBINDEX, TROPONINI in the last 168 hours.  BNP (last 3 results) No results for input(s): PROBNP in the last 8760 hours.  Lipid Profile: No results for input(s): CHOL, HDL, LDLCALC, TRIG, CHOLHDL, LDLDIRECT in the last 72 hours.  Thyroid  Function Tests: No results for input(s): TSH, T4TOTAL, FREET4, T3FREE, THYROIDAB in the last 72 hours.  Anemia Panel: No results for input(s): VITAMINB12, FOLATE, FERRITIN, TIBC, IRON, RETICCTPCT in the last 72 hours.  Urine analysis:    Component Value Date/Time   COLORURINE YELLOW 12/11/2023 1436   APPEARANCEUR HAZY (A)  12/11/2023 1436   LABSPEC 1.023 12/11/2023 1436   PHURINE 5.0 12/11/2023 1436   GLUCOSEU >=500 (A) 12/11/2023 1436   HGBUR NEGATIVE 12/11/2023 1436   BILIRUBINUR NEGATIVE 12/11/2023 1436   BILIRUBINUR negative 04/08/2021 1413   BILIRUBINUR neg 10/11/2015 1440   KETONESUR 20 (A) 12/11/2023 1436   PROTEINUR 30 (A) 12/11/2023 1436   UROBILINOGEN 0.2 04/08/2021 1413   UROBILINOGEN 0.2 09/15/2012 0000   NITRITE NEGATIVE 12/11/2023 1436   LEUKOCYTESUR NEGATIVE 12/11/2023 1436    Sepsis Labs: Lactic Acid, Venous    Component Value Date/Time   LATICACIDVEN 1.7 11/10/2022 2146  MICROBIOLOGY: No results found for this or any previous visit (from the past 240 hours).  RADIOLOGY STUDIES/RESULTS: CT Head Wo Contrast Result Date: 12/11/2023 CLINICAL DATA:  Altered mental status EXAM: CT HEAD WITHOUT CONTRAST TECHNIQUE: Contiguous axial images were obtained from the base of the skull through the vertex without intravenous contrast. RADIATION DOSE REDUCTION: This exam was performed according to the departmental dose-optimization program which includes automated exposure control, adjustment of the mA and/or kV according to patient size and/or use of iterative reconstruction technique. COMPARISON:  05/12/2021 FINDINGS: Brain: No evidence of acute infarction, hemorrhage, hydrocephalus, extra-axial collection or mass lesion/mass effect. Vascular: No hyperdense vessel or unexpected calcification. Skull: Normal. Negative for fracture or focal lesion. Sinuses/Orbits: No acute finding. Other: None. IMPRESSION: No acute intracranial abnormality noted. Electronically Signed   By: Violeta Grey M.D.   On: 12/11/2023 01:16   DG Chest Portable 1 View Result Date: 12/11/2023 CLINICAL DATA:  Altered mental status EXAM: PORTABLE CHEST 1 VIEW COMPARISON:  12/12/2022 FINDINGS: Heart and mediastinal contours are within normal limits. No focal opacities or effusions. No acute bony abnormality. IMPRESSION: No active  cardiopulmonary disease. Electronically Signed   By: Janeece Mechanic M.D.   On: 12/11/2023 00:36     LOS: 0 days   Kimberly Penna, MD  [facility]    To contact the attending provider between 7A-7P or the covering provider during after hours 7P-7A, please log into the web site [email or web address] and access using universal Gregory password for that web site. If you do not have the password, please call the hospital operator.  12/12/2023, 8:50 AM

## 2023-12-12 NOTE — Inpatient Diabetes Management (Signed)
 Inpatient Diabetes Program Recommendations  AACE/ADA: New Consensus Statement on Inpatient Glycemic Control (2015)  Target Ranges:  Prepandial:   less than 140 mg/dL      Peak postprandial:   less than 180 mg/dL (1-2 hours)      Critically ill patients:  140 - 180 mg/dL   Lab Results  Component Value Date   GLUCAP 297 (H) 12/12/2023   HGBA1C 10.9 (H) 12/11/2023    Review of Glycemic Control  Diabetes history: DM 2 Outpatient Diabetes medications: Tresiba  25 Daily Current orders for Inpatient glycemic control:  Semglee  25 units Daily Novolog  0-15 units tid Novolog  4 units tid meal coverage A1c 10.9% on 6/10  Medicaid insurance  Pt not oriented enough to speak to this morning. Will closely follow.  Thanks, Eloise Hake RN, MSN, BC-ADM Inpatient Diabetes Coordinator Team Pager [phone] (8a-5p)

## 2023-12-12 NOTE — Plan of Care (Signed)
   Problem: Education: Goal: Knowledge of General Education information will improve Description Including pain rating scale, medication(s)/side effects and non-pharmacologic comfort measures Outcome: Progressing   Problem: Clinical Measurements: Goal: Ability to maintain clinical measurements within normal limits will improve Outcome: Progressing   Problem: Clinical Measurements: Goal: Will remain free from infection Outcome: Progressing

## 2023-12-12 NOTE — TOC Initial Note (Addendum)
 Transition of Care [facility]) - Initial/Assessment Note    Patient Details  Name: [name] MRN: [id number] Date of Birth: 1975-07-12  Transition of Care [facility]) CM/SW Contact:    Jannice Mends, LCSW Phone Number: 12/12/2023, 11:23 AM  Clinical Narrative:                 Psych requesting inpatient psych placement with need for IVC due to patient lacking voluntary capacity at this time. CSW completing IVC, Envelope # Envelope [id number].   Case accepted, Case ID# [id number]. Paperwork on hard chart to be served.  No bed at [facility].   Faxed referrals to:   Service Provider Request Status Address Phone  [facility] Pending - Request [place] [address] [phone]  [facility] Pending - Request [facility] Josephina Nicks [place] [address] [phone]  [facility] Pending - Request Sent [address] Johnella Naas [place] [address] [phone]  [facility] Pending - Request Sent [address] [place], [place] [address] [phone]  [facility] Pending - Request Sent [address], [place] [address] [phone]  [facility] Pending - Request Sent [address] [place] [address] [phone]  [facility] Pending - Request Sent [address], [place] [address] [phone]  [facility] Pending - Request Sent [address] [place] [address] [phone]  [facility] Pending - Request Sent [address] [place] [address] [phone]  [facility] Pending - Request Sent [address] [place] [address] [phone]    Expected Discharge Plan: Psychiatric Hospital Barriers to Discharge: Psych Bed not available   Patient Goals and CMS Choice            Expected Discharge Plan and Services In-house Referral: Clinical Social Work                                             Prior Living Arrangements/Services   Lives with:: Self Patient language and need for interpreter reviewed:: Yes        Need for Family Participation in Patient Care: Yes (Comment) Care giver support system in place?: No (comment)   Criminal Activity/Legal Involvement Pertinent to Current Situation/Hospitalization: No - Comment as needed  Activities of Daily Living      Permission Sought/Granted Permission sought to share information with : Facility Medical sales representative                Emotional Assessment Appearance:: Appears stated age     Orientation: : Oriented to Self, Oriented to Place, Oriented to  Time, Oriented to Situation (psychotic features) Alcohol / Substance Use: Not Applicable Psych Involvement: Yes (comment)  Admission diagnosis:  DKA (diabetic ketoacidosis) (HCC) [E11.10] Manic behavior (HCC) [F30.10] Diabetic ketoacidosis without coma associated with type 2 diabetes mellitus (HCC) [E11.10] Patient Active Problem List   Diagnosis Date Noted   Microcytic anemia 10/02/2023   Intractable nausea and vomiting 12/13/2022   DKA (diabetic ketoacidosis) (HCC) 11/09/2022   DKA, type 2 (HCC) 11/08/2022   Postural dizziness with presyncope 11/08/2022   Hyperlipidemia 11/08/2022   Psychosis (HCC) 01/31/2022   Vitamin D deficiency 07/05/2021   MDD (major depressive disorder), recurrent, severe, 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/16/2021   Candida vaginitis 04/11/2021   Type 2 diabetes mellitus without complication, without long-term  current use of insulin  (HCC) 04/18/2020   Essential hypertension 04/08/2019   Class 2 severe obesity due to excess calories with serious comorbidity and body mass index (BMI) of 39.0 to 39.9 in adult [facility]) 04/08/2019   Nicotine  abuse 04/08/2019   Bipolar disorder (HCC) 05/18/2018   Diabetes mellitus (HCC) 08/10/2016   Bipolar disorder, curr episode mixed, severe, with psychotic features  (HCC) 08/07/2016   Cannabis use disorder, moderate, dependence (HCC) 08/07/2016   CHOLECYSTITIS, UNSPECIFIED 06/12/2008   Calculus of gallbladder 06/10/2008   CERUMEN IMPACTION, BILATERAL 01/17/2008   PHARYNGITIS, VIRAL 01/17/2008   NECK PAIN 01/17/2008   PCP:  Mandy Second, PA Pharmacy:   [facility] [address] [place] [address] [place] [address] Phone: [phone] Fax: [phone]     Social Drivers of Health (SDOH) Social History: SDOH Screenings   Food Insecurity: Food Insecurity Present (08/22/2023)  Housing: High Risk (08/22/2023)  Transportation Needs: Unmet Transportation Needs (08/22/2023)  Utilities: Patient Unable To Answer (12/13/2022)  Alcohol Screen: Low Risk  (08/22/2023)  Depression (PHQ2-9): High Risk (10/05/2023)  Financial Resource Strain: Medium Risk (08/22/2023)  Physical Activity: Insufficiently Active (08/22/2023)  Social Connections: Moderately Integrated (08/22/2023)  Stress: Stress Concern Present (08/22/2023)  Tobacco Use: Medium Risk (12/11/2023)  Health Literacy: Low Risk  (11/16/2022)   [facility]   SDOH Interventions:     Readmission Risk Interventions     No data to display

## 2023-12-12 NOTE — Progress Notes (Signed)
 1:1 observation; Pt received dinner tray, pt is refusing any assist from [facility].

## 2023-12-12 NOTE — Consult Note (Addendum)
 [facility] Psychiatric Consult Initial  Patient Name: .[name]  MRN: [id number]  DOB: 05/19/1976  Consult Order details:  Orders (From admission, onward)     Start     Ordered   12/12/23 0825  IP CONSULT TO PSYCHIATRY       Ordering Provider: Burton Casey, MD  Provider:  (Not yet assigned)  Question Answer Comment  Location [facility]   Reason for Consult? Longstanding history of bipolar/schizoaffective disorder-here with DKA which is now resolved but she is now behaving erratically-blowing kisses/saluting people in the room-pressured speech-please evaluate and advise.      12/12/23 0825             Mode of Visit: In person    Psychiatry Consult Evaluation  Service Date: December 12, 2023 LOS:  LOS: 0 days  Chief Complaint behaving erratically   Primary Psychiatric Diagnoses  Bipolar 1 disorder, in current episode  Assessment  [name] is a 48 y.o. female admitted: Medically for 12/11/2023 12:09 AM for diabetic ketoacidosis after being out of insulin . She carries the psychiatric diagnoses of bipolar disorder and has a past medical history of  T2DM on insulin , HTN, dyslipidemia, iron-deficiency anemia.   Her current presentation of agitation, erratic behavior, pressured speech is most consistent with manic episode. She meets criteria for involuntary commitment based on inability to care for self.  Current outpatient psychotropic medications include paroxetine  20 mg qday, PRN haldol  0.5 mg BID for agitation paired with cogentin  1 mg BID and historically she has had a good response to these medications. She was not clearly compliant with medications prior to admission as evidenced by collateral information. Please see plan below for detailed recommendations.   Diagnoses:  Active Hospital problems: Principal Problem:   DKA (diabetic ketoacidosis) (HCC)    Plan   ## Psychiatric Recommendations:   On interview, patient exhibits disorganized speech,  disorganized thinking, endorses auditory hallucinations and appears to be responding to internal stimuli (shushing voices).  She does not appear to be a threat to herself or others at this time, however, she should be involuntarily committed if she requests to leave the hospital and will likely need inpatient psychiatric stabilization after her status is stabilized.  She was previously seen at the behavioral health urgent care this past April for acute psychosis and paranoia about her boyfriend trying to kill her, was discharged to [facility] at that time.  We will restart patient's scheduled Haldol  at 5 mg BID with Haldol  5 mg and Ativan  PRNs as needed.  We will also start Depakote  for mood stabilization after her urine pregnancy test returns negative.  We will stop the patient's Paxil  20 mg daily as this is an activating medicine for people prone to manic episodes.  We will also order vitamin labs.  Later in afternoon, patient became agitated, punching the wall, needing increasing PRNs, final changes below:   - Start Haldol  5 mg BID, with eye towards Haldol  decanoate. Will titrate on 6/12 based on Haldol  PRN usage. - Start PRN Haldol  5 mg Q6H for agitation PO with IM on backup.  - Start PRN Ativan  1 mg Q6H for agitation PO with IM on backup.  - Urine pregnancy test ordered, if negative, will start Depakote  250 mg BID.  We will also order Depakote  lab 4 days after initiation of this medication. - Stop Paxil  20 mg qday as this medication may be activating in someone with a bipolar presentation.  Monitor for withdrawal.  Per her medication fill history, it is unlikely that she was taking this regularly.  - Psychiatry will continue to follow.  ## Medical Decision Making Capacity: Not specifically addressed in this encounter  ## Further Work-up:  -- Urine pregnancy test ordered in anticipation of starting Depakote  -- Vitamin D, folate, B12 - While pt on Qtc prolonging medications, please monitor &  replete K+ to 4 and Mg2+ to 2 -- most recent EKG on 6/10 had QtC of 451 -- Pertinent labwork reviewed earlier this admission includes: HIV nonreactive, TSH WNL   ## Disposition:-- We recommend inpatient psychiatric hospitalization when medically cleared. Patient is under voluntary admission status at this time; please IVC if attempts to leave hospital.  ## Behavioral / Environmental: - No specific recommendations at this time.     ## Safety and Observation Level:  - Based on my clinical evaluation, I estimate the patient to be at low risk of self harm in the current setting. - At this time, we recommend  routine. This decision is based on my review of the chart including patient's history and current presentation, interview of the patient, mental status examination, and consideration of suicide risk including evaluating suicidal ideation, plan, intent, suicidal or self-harm behaviors, risk factors, and protective factors. This judgment is based on our ability to directly address suicide risk, implement suicide prevention strategies, and develop a safety plan while the patient is in the clinical setting. Please contact our team if there is a concern that risk level has changed.  CSSR Risk Category:C-SSRS RISK CATEGORY: No Risk  Suicide Risk Assessment: Patient has following modifiable risk factors for suicide: social isolation and medication noncompliance, which we will address after psychiatric stabilization Patient has following non-modifiable or demographic risk factors for suicide: psychiatric hospitalization Patient has the following protective factors against suicide: Supportive family, Cultural, spiritual, or religious beliefs that discourage suicide, Frustration tolerance, no history of suicide attempts, and no history of NSSIB  Thank you for this consult request. Recommendations have been communicated to the primary team.  We will CONTINUE TO FOLLOW at this time.   Joren Rehm,  MD       History of Present Illness  Relevant Aspects of Hospital Course:  Admitted on 12/11/2023 for disorganized speech and hyperglycemia. Was arguing with a gas station clerk and escorted to the ER voluntarily by police. Found to be in DKA after several weeks without regular insulin . Was manic-appearing on admission: agitated, pressured speech, blowing kisses with questionable RTIS, requiring PRN Geodon  and one-time Ativan .   Patient Report:   On interview, patient is making disorganized facial and hand gestures, squinting, as well as occasional jerking her hands around.  Also exhibits intense psychotic stare.  When asked why she is here in the hospital she says that she does not know and cannot explain.  Exhibits clanging with pressured speech (lay it to the lord, lord, lord.)  Appears to be arguing with voices.  Endorses auditory hallucinations.  Denies visual hallucinations.  Says she has had some worries about hurting herself since the fiasco on 6/5 in her father's garage-denies vehemently current suicidal and homicidal ideation.  Reassured patient that she was in a safe environment and that we are going to use medications to help her during this hospital stay.  Later, outside of patient's room, she can be heard saying go away in a loud voice.  Psych ROS:  Depression: Unable to assess Anxiety: Unable to assess Mania (lifetime and current): Unable to assess, appears acutely  manic Psychosis: (lifetime and current): Endorses auditory hallucinations, denies visual hallucinations.  Collateral information:   Patient provided consent to speak to [facility], unable to identify this persons contact information. Contacted Mother, Crecencio Dodge at [phone] on 12/12/2023 for passive information collection -- did not divulge patient's name, location, status, or any other associated information.   Patient lives alone in [place]. Mother lives in [place]. Speaks with patient on the  phone. Patient has schizoaffective disorder and has been in-and-out of the hospital on several occasions. Sometimes does not take or medications. No firearms. Has been happening more frequently lately. Been saying that she hears voices, which concerns her mother. Never been known to hurt herself or anyone else. At this point, she may need help in the home to help her manage her medicine/food intake.   Psychiatric and Social History  Psychiatric History:  Information collected from patient, collateral and chart review  Prev Dx/Sx: schizoaffective disorder, PTSD and personality disorder Current Psych Provider: Has been seen by [facility] in the past, unclear if the patient is active with them Home Meds (current): Paxil  20 mg qday, Haldol  0.5 mg BID PRN for agitation, cogentin  1 mg BID Previous Med Trials: Abilify  15 mg qday, discontinued as not able to afford. Haldol  decanoate injections, haldol  5 mg qday Therapy: Previously with [facility]  Prior Psych Hospitalization: Multiple, previously.  Appears patient was discharged [facility] from the behavioral health urgent care.  Two inpatient psychiatric hospitalizations over the past two years for similar presentation of hyperglycemia and mania: most recently 11/15/2022 - 11/17/2019 at [facility] and 8/21-8/31/2023. Improved on haldol  5 mg qday.  Prior Self Harm: No history, per cousin in chart review Prior Violence: Unable to assess  Family Psych History: Unable to assess Family Hx suicide: Unable to assess  Social History:  Developmental Hx: Unable to assess Educational Hx: Unable to assess Occupational Hx: Unable to assess Legal Hx: Unable to assess Living Situation: Lives alone, per mom Access to weapons/lethal means: Mother denies  Substance History Alcohol: Previous, minor Type of alcohol UTA Last Drink UTA Number of drinks per day UTA History of alcohol withdrawal seizures UTA History of DT's UTA Tobacco:  Sparse Cannabis: previously used marijuana vape pens every day.  Prescription drug abuse: UTA Rehab hx: UTA  Exam Findings  Physical Exam:  Vital Signs:  Temp:  [97.9 F (36.6 C)-98.7 F (37.1 C)] 97.9 F (36.6 C) (06/11 0758) Pulse Rate:  [83-102] 96 (06/11 0758) Resp:  [16-23] 23 (06/11 0758) BP: (115-155)/(68-91) 137/90 (06/11 0758) SpO2:  [100 %] 100 % (06/10 1705) Weight:  [117.4 kg] 117.4 kg (06/10 1705) Blood pressure (!) 137/90, pulse 96, temperature 97.9 F (36.6 C), temperature source Oral, resp. rate (!) 23, height 5' 11 (1.803 m), weight 117.4 kg, SpO2 100%. Body mass index is 36.1 kg/m.  Physical Exam Vitals reviewed.  Constitutional:      General: She is in acute distress.     Appearance: She is not ill-appearing.  Pulmonary:     Effort: Pulmonary effort is normal. No respiratory distress.  Neurological:     Mental Status: She is alert.     Mental Status Exam: General Appearance: Patient is in gown, disheveled, disorganized  Orientation:  Negative  Memory:  NA  Concentration: Unable to assess  Recall:  NA  Attention  Poor  Eye Contact: Intense, psychotic appearing  Speech:  Garbled, Pressured, and clanging present  Language:  Fair  Volume:  Increased  Mood: Unable to  assess  Affect: Expansive, labile, suspicious  Thought Process:  Disorganized and Irrelevant  Thought Content:  Illogical, Paranoid Ideation, Rumination, and Tangential  Suicidal Thoughts:  No  Homicidal Thoughts:  No  Judgement:  Impaired  Insight: Impaired  Psychomotor Activity:  Normal, Increased, and Restlessness  Akathisia:  No  Fund of Knowledge:  NA      Assets:  Desire for Improvement Housing Social Support  Cognition: Unable to assess  ADL's:  Unable to assess  AIMS (if indicated):        Other History   These have been pulled in through the EMR, reviewed, and updated if appropriate.  Family History:  The patient's family history includes Diabetes in her father,  maternal grandmother, and paternal grandmother; Healthy in her mother; Mental illness in her cousin.  Medical History: Past Medical History:  Diagnosis Date  . Abnormal Pap smear   . Bipolar 1 disorder (HCC)   . Diabetes mellitus type I (HCC)   . Diabetes mellitus without complication (HCC)   . Fibroids   . Hypertension   . IBS (irritable bowel syndrome)     Surgical History: Past Surgical History:  Procedure Laterality Date  . CERVICAL BIOPSY    . CHOLECYSTECTOMY    . ESSURE TUBAL LIGATION    . HIATAL HERNIA REPAIR    . TUBAL LIGATION       Medications:   Current Facility-Administered Medications:  .  acetaminophen  (TYLENOL ) tablet 650 mg, 650 mg, Oral, Q6H PRN **OR** acetaminophen  (TYLENOL ) suppository 650 mg, 650 mg, Rectal, Q6H PRN, Deforest Fast, MD .  benztropine  (COGENTIN ) tablet 1 mg, 1 mg, Oral, BID, Joseph, Preetha, MD, 1 mg at 12/11/23 2310 .  dextrose  50 % solution 0-50 mL, 0-50 mL, Intravenous, PRN, Joseph, Preetha, MD .  enoxaparin  (LOVENOX ) injection 60 mg, 60 mg, Subcutaneous, Q24H, Joseph, Preetha, MD, 60 mg at 12/11/23 1738 .  haloperidol  (HALDOL ) tablet 0.5 mg, 0.5 mg, Oral, QHS, Joseph, Preetha, MD, 0.5 mg at 12/11/23 2310 .  hydrOXYzine  (ATARAX ) tablet 25 mg, 25 mg, Oral, Q8H PRN, Deforest Fast, MD .  insulin  aspart (novoLOG ) injection 0-15 Units, 0-15 Units, Subcutaneous, TID WC, Deforest Fast, MD, 3 Units at 12/11/23 1859 .  insulin  glargine-yfgn (SEMGLEE ) injection 20 Units, 20 Units, Subcutaneous, Daily, Deforest Fast, MD, 20 Units at 12/11/23 1712 .  melatonin tablet 5 mg, 5 mg, Oral, QHS, Joseph, Preetha, MD, 5 mg at 12/11/23 2030 .  ondansetron  (ZOFRAN ) tablet 4 mg, 4 mg, Oral, Q6H PRN **OR** ondansetron  (ZOFRAN ) injection 4 mg, 4 mg, Intravenous, Q6H PRN, Deforest Fast, MD .  PARoxetine  (PAXIL ) tablet 20 mg, 20 mg, Oral, Daily, Joseph, Preetha, MD, 20 mg at 12/11/23 1738  Allergies: No Known Allergies  Dakwan Pridgen, MD

## 2023-12-13 DIAGNOSIS — E1110 Type 2 diabetes mellitus with ketoacidosis without coma: Secondary | ICD-10-CM | POA: Diagnosis not present

## 2023-12-13 LAB — PREGNANCY, URINE: Preg Test, Ur: NEGATIVE

## 2023-12-13 LAB — GLUCOSE, CAPILLARY
Glucose-Capillary: 289 mg/dL — ABNORMAL HIGH (ref 70–99)
Glucose-Capillary: 268 mg/dL — ABNORMAL HIGH (ref 70–99)
Glucose-Capillary: 210 mg/dL — ABNORMAL HIGH (ref 70–99)
Glucose-Capillary: 313 mg/dL — ABNORMAL HIGH (ref 70–99)

## 2023-12-13 LAB — FOLATE: Folate: 21.3 ng/mL (ref >5.9)

## 2023-12-13 LAB — VITAMIN D 25 HYDROXY (VIT D DEFICIENCY, FRACTURES): Vit D, 25-Hydroxy: 18.88 ng/mL — ABNORMAL LOW (ref 30–100)

## 2023-12-13 LAB — VITAMIN B12: Vitamin B-12: 522 pg/mL (ref 180–914)

## 2023-12-13 MED ORDER — HYDROXYZINE HCL 25 MG PO TABS: 25.0000 mg | ORAL_TABLET | Freq: Three times a day (TID) | ORAL | Status: DC | PRN

## 2023-12-13 MED ORDER — VITAMIN B-1 100 MG PO TABS: 100.0000 mg | ORAL_TABLET | Freq: Every day | ORAL | Status: AC

## 2023-12-13 MED ORDER — LORAZEPAM 2 MG/ML IJ SOLN
1.0000 mg | Freq: Once | INTRAMUSCULAR | Status: AC
  Administered 2023-12-13: 1 mg via INTRAMUSCULAR
  Filled 2023-12-13: qty 1

## 2023-12-13 MED ORDER — NOVOLOG FLEXPEN 100 UNIT/ML ~~LOC~~ SOPN: PEN_INJECTOR | SUBCUTANEOUS | Status: DC

## 2023-12-13 MED ORDER — INSULIN GLARGINE-YFGN 100 UNIT/ML ~~LOC~~ SOLN: 30.0000 [IU] | Freq: Every day | SUBCUTANEOUS | Status: DC

## 2023-12-13 MED ORDER — HALOPERIDOL 5 MG PO TABS: 5.0000 mg | ORAL_TABLET | Freq: Two times a day (BID) | ORAL | Status: AC

## 2023-12-13 MED ORDER — VITAMIN D (ERGOCALCIFEROL) 1.25 MG (50000 UNIT) PO CAPS
50000.0000 [IU] | ORAL_CAPSULE | ORAL | Status: DC
  Administered 2023-12-13: 50000 [IU] via ORAL
  Filled 2023-12-13: qty 1

## 2023-12-13 MED ORDER — INSULIN ASPART 100 UNIT/ML IJ SOLN: 4.0000 [IU] | Freq: Three times a day (TID) | INTRAMUSCULAR | Status: DC

## 2023-12-13 MED ORDER — INSULIN GLARGINE-YFGN 100 UNIT/ML ~~LOC~~ SOLN
30.0000 [IU] | Freq: Every day | SUBCUTANEOUS | Status: DC
  Administered 2023-12-13 – 2023-12-14 (×2): 30 [IU] via SUBCUTANEOUS
  Filled 2023-12-13 (×2): qty 0.3

## 2023-12-13 MED ORDER — ZIPRASIDONE MESYLATE 20 MG IM SOLR: 20.0000 mg | Freq: Two times a day (BID) | INTRAMUSCULAR | Status: DC | PRN

## 2023-12-13 MED ORDER — THIAMINE MONONITRATE 100 MG PO TABS
100.0000 mg | ORAL_TABLET | Freq: Every day | ORAL | Status: DC
  Administered 2023-12-14: 100 mg via ORAL
  Filled 2023-12-13: qty 1

## 2023-12-13 MED ORDER — VITAMIN D (ERGOCALCIFEROL) 1.25 MG (50000 UNIT) PO CAPS: 50000.0000 [IU] | ORAL_CAPSULE | ORAL | Status: DC

## 2023-12-13 MED ORDER — HALOPERIDOL LACTATE 5 MG/ML IJ SOLN: 5.0000 mg | Freq: Four times a day (QID) | INTRAMUSCULAR | Status: DC | PRN

## 2023-12-13 MED ORDER — THIAMINE HCL 100 MG/ML IJ SOLN
100.0000 mg | Freq: Once | INTRAMUSCULAR | Status: DC
  Filled 2023-12-13: qty 2

## 2023-12-13 MED ORDER — LORAZEPAM 2 MG/ML IJ SOLN: 2.0000 mg | Freq: Four times a day (QID) | INTRAMUSCULAR | Status: DC | PRN

## 2023-12-13 NOTE — Consult Note (Signed)
 [facility] Consult Follow-up  Patient Name: .[name]  MRN: [id number]  DOB: 05/19/1976  Consult Order details:  Orders (From admission, onward)     Start     Ordered   12/12/23 0825  IP CONSULT TO PSYCHIATRY       Ordering Provider: Burton Casey, MD  Provider:  (Not yet assigned)  Question Answer Comment  Location [facility]   Reason for Consult? Longstanding history of bipolar/schizoaffective disorder-here with DKA which is now resolved but she is now behaving erratically-blowing kisses/saluting people in the room-pressured speech-please evaluate and advise.      12/12/23 0825             Mode of Visit: In person    Psychiatry Consult Evaluation  Service Date: December 13, 2023 LOS:  LOS: 1 day  Chief Complaint behaving erratically   Primary Psychiatric Diagnoses  Bipolar 1 disorder, in current episode Vitamin D Deficiency  Assessment  [name] is a 48 y.o. female admitted: Medically for 12/11/2023 12:09 AM for diabetic ketoacidosis after being out of insulin . She carries the psychiatric diagnoses of bipolar disorder and has a past medical history of  T2DM on insulin , HTN, dyslipidemia, iron-deficiency anemia.   Her current presentation of agitation, erratic behavior, pressured speech is most consistent with manic episode. She meets criteria for involuntary commitment based on inability to care for self.  Current outpatient psychotropic medications include paroxetine  20 mg qday, PRN haldol  0.5 mg BID for agitation paired with cogentin  1 mg BID and historically she has had a good response to these medications. She was not clearly compliant with medications prior to admission as evidenced by collateral information. Please see plan below for detailed recommendations.   Diagnoses:  Active Hospital problems: Principal Problem:   DKA (diabetic ketoacidosis) (HCC)    Plan   ## Psychiatric Recommendations:   On interview, patient appears  much improved from yesterday, out of restraints.  Is more linear but continues to be paranoid and somewhat disorganized/difficult to follow.  Have reached out to [facility] to determine patient's medication regimen.  It appears she participates in [place] [facility] clinic. Is still found to be Vitamin D deficient to 18.8.  No changes to the patient's medication regimen at this time.  - Continue to limit restraints much possible, goal to discharge to inpatient psychiatric care in the next 24 hours. - Continue scheduled Haldol  5 mg BID, with eye towards Haldol  decanoate LAI after making contact with [facility].  - Start ergocalciferol 50,000 U qWeek for vitamin D deficiency.  - Start empiric thiamine supplementation: 100 mg IV today followed by 100 mg PO qday.   - Recommend potassium supplementation to >4.0 and magnesium  to >2 in setting of multiple QTc prolongation medications.  - Continue Cogentin  1 mg twice daily for EPS prophylaxis. - Continue PRN Haldol  5 mg Q6H for agitation PO with IM on backup.  - Continue PRN Ativan  1 mg Q6H for agitation PO with IV Ativan  2 mg every 6 hours as needed on backup.  - Continue PRN Benadryl  50 mg Q6H for agitation. - Continue PRN IM Geodon  20 mg every 12 hours as needed for severe agitation. - Urine pregnancy test ordered, if negative, will start Depakote  250 mg BID.  We will also order Depakote  lab 4 days after initiation of this medication. - Psychiatry will continue to follow.  ## Medical Decision Making Capacity: Not specifically addressed in this encounter  ## Further Work-up:  -- Urine pregnancy  test ordered in anticipation of starting Depakote  -- Vitamin D 18.8 - folate, B12 within normal limits - While pt on Qtc prolonging medications, please monitor & replete K+ to 4 and Mg2+ to 2 -- most recent EKG on 6/10 had QtC of 451 -- Pertinent labwork reviewed earlier this admission includes: HIV nonreactive, TSH WNL   ## Disposition:-- We recommend inpatient  psychiatric hospitalization when medically cleared. Patient is under voluntary admission status at this time; please IVC if attempts to leave hospital.  ## Behavioral / Environmental: - No specific recommendations at this time.     ## Safety and Observation Level:  - Based on my clinical evaluation, I estimate the patient to be at low risk of self harm in the current setting. - At this time, we recommend  routine. This decision is based on my review of the chart including patient's history and current presentation, interview of the patient, mental status examination, and consideration of suicide risk including evaluating suicidal ideation, plan, intent, suicidal or self-harm behaviors, risk factors, and protective factors. This judgment is based on our ability to directly address suicide risk, implement suicide prevention strategies, and develop a safety plan while the patient is in the clinical setting. Please contact our team if there is a concern that risk level has changed.  CSSR Risk Category:C-SSRS RISK CATEGORY: No Risk  Suicide Risk Assessment: Patient has following modifiable risk factors for suicide: social isolation and medication noncompliance, which we will address after psychiatric stabilization Patient has following non-modifiable or demographic risk factors for suicide: psychiatric hospitalization Patient has the following protective factors against suicide: Supportive family, Cultural, spiritual, or religious beliefs that discourage suicide, Frustration tolerance, no history of suicide attempts, and no history of NSSIB  Thank you for this consult request. Recommendations have been communicated to the primary team.  We will CONTINUE TO FOLLOW at this time.   Zarria Towell, MD       History of Present Illness  Relevant Aspects of Hospital Course:  Admitted on 12/11/2023 for disorganized speech and hyperglycemia. Was arguing with a gas station clerk and escorted to the ER  voluntarily by police. Found to be in DKA after several weeks without regular insulin . Was manic-appearing on admission: agitated, pressured speech, blowing kisses with questionable RTIS, requiring PRN Geodon  and one-time Ativan .   Patient Report:   On interview this morning, patient is hoarse and drowsy.  Alert and oriented x 2.  Says that her throat is very sore and that she has been yelling.  Has been working out a lot.  Continues to be paranoid about current presentation and - says that every time this happens, it is too implode my money.  Believes this is a conspiracy to take her away from her work.  It is very anxious to attend a [employer] in [place].  Endorses homicidal ideation toward people to get smart with me but no one in particular.  Endorsed passive suicidal ideation -- sometimes I want to leave the earth because people keep messing with me.  Denied active suicidal ideation with plan.  Was able to contract for safety.  Patient showed improved insight today, that she may have to go back on Abilify .  Is amenable to getting a Haldol  LAI shot.  Very much likes her ACT team at [facility], gave Clinical research associate verbal permission to reach out to them.  Is concerned about the state of her apartment, and hopes that we can check. Psych ROS:  Depression: Unable to assess Anxiety:  Unable to assess Mania (lifetime and current): Unable to assess, appears acutely manic Psychosis: (lifetime and current): Endorses auditory hallucinations, denies visual hallucinations.  Collateral information:   Patient provided consent to speak to [facility], unable to identify this person's contact information. Contacted Mother, Crecencio Dodge at [phone] on 12/12/2023 for passive information collection -- did not divulge patient's name, location, status, or any other associated information.   Patient lives alone in [place]. Mother lives in [place]. Speaks with patient on the phone. Patient has  schizoaffective disorder and has been in-and-out of the hospital on several occasions. Sometimes does not take or medications. No firearms. Has been happening more frequently lately. Been saying that she hears voices, which concerns her mother. Never been known to hurt herself or anyone else. At this point, she may need help in the home to help her manage her medicine/food intake.   Psychiatric and Social History  Psychiatric History:  Information collected from patient, collateral and chart review  Prev Dx/Sx: schizoaffective disorder, PTSD and personality disorder Current Psych Provider: Has been seen by [facility] in the past, unclear if the patient is active with them Home Meds (current): Paxil  20 mg qday, Haldol  0.5 mg BID PRN for agitation, cogentin  1 mg BID Previous Med Trials: Abilify  15 mg qday, discontinued as not able to afford. Haldol  decanoate injections, haldol  5 mg qday Therapy: Previously with [facility]  Prior Psych Hospitalization: Multiple, previously.  Appears patient was discharged to [facility] from the behavioral health urgent care.  Two inpatient psychiatric hospitalizations over the past two years for similar presentation of hyperglycemia and mania: most recently 11/15/2022 - 11/17/2019 at [facility] and 8/21-8/31/2023. Improved on haldol  5 mg qday.  Prior Self Harm: No history, per cousin in chart review Prior Violence: Unable to assess  Family Psych History: Unable to assess Family Hx suicide: Unable to assess  Social History:  Developmental Hx: Unable to assess Educational Hx: Unable to assess Occupational Hx: Unable to assess Legal Hx: Unable to assess Living Situation: Lives alone, per mom Access to weapons/lethal means: Mother denies  Substance History Alcohol: Previous, non-contributory  Type of alcohol UTA Last Drink UTA Number of drinks per day UTA History of alcohol withdrawal seizures UTA History of DT's UTA Tobacco: Previous,  sparse. Cannabis: previously used marijuana vape pens every day, current use unclear.  Prescription drug abuse: UTA Rehab hx: UTA  Exam Findings  Physical Exam:  Vital Signs:  Temp:  [97.6 F (36.4 C)-98.7 F (37.1 C)] 98.5 F (36.9 C) (06/12 0736) Pulse Rate:  [77-107] 81 (06/12 0736) Resp:  [18-22] 19 (06/12 0736) BP: (113-178)/(68-97) 113/68 (06/12 0736) SpO2:  [92 %-97 %] 97 % (06/12 0736) Blood pressure 113/68, pulse 81, temperature 98.5 F (36.9 C), temperature source Oral, resp. rate 19, height 5' 11 (1.803 m), weight 117.4 kg, SpO2 97%. Body mass index is 36.1 kg/m.  Physical Exam Vitals reviewed.  Constitutional:      General: She is in acute distress.     Appearance: She is not ill-appearing.  Pulmonary:     Effort: Pulmonary effort is normal. No respiratory distress.   Neurological:     Mental Status: She is alert.     Mental Status Exam: General Appearance: Patient is in gown, out of restraints, better.  Orientation: Self, place but not to month or year  Memory: Impaired  Concentration: Unable to assess  Recall: Impaired  Attention good, improving  Eye Contact: Less intense, improving  Speech: Slow, very infrequent  whispering, normal volume  Language:  Fair  Volume: Normal  Mood: Drowsy  Affect: Congruent, appropriate  Thought Process: Organized, and relevant  Thought Content: Largely linear, with appropriate concern for real-world issues, however some paranoid thinking remains  Suicidal Thoughts: Endorses some passive suicidal ideation because she is being messed with, but that this is also a little joke.  Denied active suicidal ideation with plan  Homicidal Thoughts: Yes, generalized, contracted for safety  Judgement:  Impaired, improving significantly  Insight: Impaired, improving  Psychomotor Activity:  Normal, Increased, and Restlessness  Akathisia:  No  Fund of Knowledge:  NA      Assets:  Desire for Improvement Housing Social Support   Cognition: Unable to assess  ADL's:  Unable to assess  AIMS (if indicated):        Other History   These have been pulled in through the EMR, reviewed, and updated if appropriate.  Family History:  The patient's family history includes Diabetes in her father, maternal grandmother, and paternal grandmother; Healthy in her mother; Mental illness in her cousin.  Medical History: Past Medical History:  Diagnosis Date   Abnormal Pap smear    Bipolar 1 disorder (HCC)    Diabetes mellitus type I (HCC)    Diabetes mellitus without complication (HCC)    Fibroids    Hypertension    IBS (irritable bowel syndrome)     Surgical History: Past Surgical History:  Procedure Laterality Date   CERVICAL BIOPSY     CHOLECYSTECTOMY     ESSURE TUBAL LIGATION     HIATAL HERNIA REPAIR     TUBAL LIGATION       Medications:   Current Facility-Administered Medications:    acetaminophen  (TYLENOL ) tablet 650 mg, 650 mg, Oral, Q6H PRN **OR** acetaminophen  (TYLENOL ) suppository 650 mg, 650 mg, Rectal, Q6H PRN, Deforest Fast, MD   benztropine  (COGENTIN ) tablet 1 mg, 1 mg, Oral, BID, Deforest Fast, MD, 1 mg at 12/13/23 1610   dextrose  50 % solution 0-50 mL, 0-50 mL, Intravenous, PRN, Deforest Fast, MD   diphenhydrAMINE  (BENADRYL ) capsule 50 mg, 50 mg, Oral, Q6H PRN, Gwyndolyn Lerner, MD, 50 mg at 12/12/23 2158   enoxaparin  (LOVENOX ) injection 60 mg, 60 mg, Subcutaneous, Q24H, Deforest Fast, MD, 60 mg at 12/11/23 1738   haloperidol  (HALDOL ) tablet 5 mg, 5 mg, Oral, Q6H PRN **OR** haloperidol  lactate (HALDOL ) injection 5 mg, 5 mg, Intramuscular, Q6H PRN, Gwyndolyn Lerner, MD, 5 mg at 12/12/23 1121   haloperidol  (HALDOL ) tablet 5 mg, 5 mg, Oral, BID, Gwyndolyn Lerner, MD, 5 mg at 12/13/23 9604   hydrOXYzine  (ATARAX ) tablet 25 mg, 25 mg, Oral, Q8H PRN, Deforest Fast, MD   insulin  aspart (novoLOG ) injection 0-15 Units, 0-15 Units, Subcutaneous, TID WC, Joseph, Preetha, MD, 8 Units at 12/13/23  0850   insulin  aspart (novoLOG ) injection 4 Units, 4 Units, Subcutaneous, TID WC, Ghimire, Estil Heman, MD, 4 Units at 12/12/23 1726   insulin  glargine-yfgn (SEMGLEE ) injection 30 Units, 30 Units, Subcutaneous, Daily, Ghimire, Estil Heman, MD   lisinopril  (ZESTRIL ) tablet 20 mg, 20 mg, Oral, Daily, Ghimire, Shanker M, MD, 20 mg at 12/13/23 5409   LORazepam  (ATIVAN ) tablet 1 mg, 1 mg, Oral, Q6H PRN **OR** LORazepam  (ATIVAN ) injection 2 mg, 2 mg, Intravenous, Q6H PRN, Ghimire, Estil Heman, MD, 2 mg at 12/12/23 1235   melatonin tablet 5 mg, 5 mg, Oral, QHS, Joseph, Preetha, MD, 5 mg at 12/12/23 2158   ondansetron  (ZOFRAN ) tablet 4 mg, 4 mg, Oral, Q6H PRN **OR** ondansetron  (  ZOFRAN ) injection 4 mg, 4 mg, Intravenous, Q6H PRN, Deforest Fast, MD   ziprasidone  (GEODON ) injection 20 mg, 20 mg, Intramuscular, Q12H PRN, Ghimire, Shanker M, MD, 20 mg at 12/12/23 1815  Allergies: No Known Allergies  Chantavia Bazzle, MD

## 2023-12-13 NOTE — Inpatient Diabetes Management (Signed)
 Inpatient Diabetes Program Recommendations  AACE/ADA: New Consensus Statement on Inpatient Glycemic Control (2015)  Target Ranges:  Prepandial:   less than 140 mg/dL      Peak postprandial:   less than 180 mg/dL (1-2 hours)      Critically ill patients:  140 - 180 mg/dL   Lab Results  Component Value Date   GLUCAP 268 (H) 12/13/2023   HGBA1C 10.9 (H) 12/11/2023    Review of Glycemic Control  Diabetes history: DM 2 Outpatient Diabetes medications: Tresiba  25 Daily Current orders for Inpatient glycemic control:  Semglee  25 units Daily Novolog  0-15 units tid Novolog  4 units tid meal coverage A1c 10.9% on 6/10  Medicaid insurance  Spoke with pt at bedside. Pt did not take time to get and take her insulin . Pt able to get her insulin  and afford her resources. Pt reports finding it difficulty checking her glucose at home and maybe checks 4 times a week. When asked about CGM pt reports not liking it on her arm due to her performing. Encouraged pt to check glucose at least once a day and follow up with her doctor.  Home insulin  doses seem accurate based on glucose trends inpatient.  Thanks, Eloise Hake RN, MSN, BC-ADM Inpatient Diabetes Coordinator Team Pager [phone] (8a-5p)

## 2023-12-13 NOTE — Progress Notes (Signed)
 PROGRESS NOTE        PATIENT DETAILS Name: [name] Age: 48 y.o. Sex: female Date of Birth: 1975-07-26 Admit Date: 12/11/2023 Admitting Physician Tylene Galla Donzell Gallery, MD AVW:[id number], Driscilla George, PA  Brief Summary: Patient is a 48 y.o.  female with history of bipolar disorder/schizoaffective disorder, DM-2-who apparently ran out of insulin  2-3 weeks back-brought in with acute metabolic encephalopathy and DKA.  Significant events: 6/10>> admit to [facility]  Significant studies: 6/10>> CT head: No acute abnormality 6/10>> CXR: No pneumonia.  Significant microbiology data: None  Procedures: None  Consults: None  Subjective: Sleeping comfortably when I walked in-awoke easily-was very pleasant to me-answered all my questions appropriately-moves all 4 extremities.  I have asked nursing staff to see if we can get her off restraints this morning.  She did have a few episodes of agitation since yesterday requiring intermittent use of restraints.  Objective: Vitals: Blood pressure 113/68, pulse 81, temperature 98.5 F (36.9 C), temperature source Oral, resp. rate 19, height 5' 11 (1.803 m), weight 117.4 kg, SpO2 97%.   Exam: Not in any distress Relatively awake and alert-no bizarre behavior this morning-compared to yesterday Chest: Clear to auscultation CVS: S1-S2 regular Extremities: No edema Nonfocal exam.  Pertinent Labs/Radiology:    Latest Ref Rng & Units 12/12/2023    3:14 PM 12/11/2023    5:44 PM 12/11/2023    2:00 AM  CBC  WBC 4.0 - 10.5 K/uL 8.9  9.8    Hemoglobin 12.0 - 15.0 g/dL 91.4  78.2  95.6   Hematocrit 36.0 - 46.0 % 34.9  37.3  35.0   Platelets 150 - 400 K/uL 269  290      Lab Results  Component Value Date   NA 131 (L) 12/12/2023   K 3.4 (L) 12/12/2023   CL 100 12/12/2023   CO2 21 (L) 12/12/2023      Assessment/Plan: Acute metabolic encephalopathy Secondary to DKA Was agitated on initial arrival to the ED-overall improved but  suspect that her current issues with bizarre behavior are related to psychosis.  DKA Resolved with IVF/IV insulin  Likely secondary to running out of insulin  2-3 weeks back.  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 Psychiatry following-medications have been adjusted-on Haldol //Cogentin  On as needed Geodon /lorazepam /Haldol  as well. I have asked nursing staff to see if we can get her off restraints this morning-she is very pleasant and calm today-hopefully with medications adjustment we can get her psychosis stabilized. Recommendations from psychiatry are for inpatient psych-she is medically stable to be transferred when bed is available.  HTN Stable on lisinopril .  Class 2 Obesity Estimated body mass index is 36.1 kg/m as calculated from the following:   Height as of this encounter: 5' 11 (1.803 m).   Weight as of this encounter: 117.4 kg.   Code status:   Code Status: Full Code   DVT  Prophylaxis: SQ Lovenox    Family Communication: Sister Natalene none-[phone]-updated 6/11.   Disposition Plan: Status is: Observation The patient will require care spanning > 2 midnights and should be moved to inpatient because: Severity of illness   Planned Discharge Destination: Inpatient psych   Diet: Diet Order             Diet Carb Modified Fluid consistency: Thin; Room service appropriate? Yes   Diet effective now                     Antimicrobial agents: Anti-infectives (From admission, onward)    None        MEDICATIONS: Scheduled Meds:  benztropine   1 mg Oral BID   enoxaparin  (LOVENOX ) injection  60 mg Subcutaneous Q24H   haloperidol   5 mg Oral BID   insulin  aspart  0-15 Units Subcutaneous TID WC   insulin  aspart  4 Units Subcutaneous TID WC   insulin  glargine-yfgn  25 Units Subcutaneous Daily   lisinopril   20 mg Oral Daily   melatonin  5 mg Oral QHS   Continuous Infusions: PRN Meds:.acetaminophen  **OR** acetaminophen , dextrose , diphenhydrAMINE , haloperidol  **OR** haloperidol  lactate, hydrOXYzine , LORazepam  **OR** LORazepam , ondansetron  **OR** ondansetron  (ZOFRAN ) IV, ziprasidone    I have personally reviewed following labs and imaging studies  LABORATORY DATA: CBC: Recent Labs  Lab 12/11/23 0027 12/11/23 0200 12/11/23 1744 12/12/23 1514  WBC 11.9*  --  9.8 8.9  NEUTROABS 9.9*  --   --   --   HGB 12.7 11.9* 12.2 11.5*  HCT 39.0 35.0* 37.3 34.9*  MCV 81.3  --  79.4* 79.9*  PLT 296  --  290 269    Basic Metabolic Panel: Recent Labs  Lab 12/11/23 0027 12/11/23 0200 12/11/23 0321 12/11/23 1032 12/11/23 1455 12/11/23 1744 12/12/23 1514  NA 131* 133* 137  --  136  --  131*  K 4.4 4.1 3.9  --  3.5  --  3.4*  CL 98  --  101  --  103  --  100  CO2 16*  --  21*  --  24  --  21*  GLUCOSE 521*  --  192*  --  158*  --  281*  BUN 21*  --  22*  --  20  --  16  CREATININE 1.28*  --  1.21*  --  0.93 0.93 0.81  CALCIUM  10.2  --  10.3  --  9.6  --  9.4  MG  --   --   --  1.9  --   --  1.7    GFR: Estimated Creatinine Clearance: 122.5 mL/min (by C-G formula based on SCr of 0.81 mg/dL).  Liver Function Tests: Recent Labs  Lab 12/11/23 0027  AST 25  ALT 22  ALKPHOS 92  BILITOT 2.0*  PROT 7.5  ALBUMIN 4.0   No results for input(s): LIPASE, AMYLASE in the last 168 hours. No results for input(s): AMMONIA in the last 168  hours.  Coagulation Profile: No results for input(s): INR, PROTIME in the last 168 hours.  Cardiac Enzymes: No results for input(s): CKTOTAL, CKMB, CKMBINDEX, TROPONINI in the last 168 hours.  BNP (last 3 results) No results for input(s): PROBNP in the last 8760 hours.  Lipid Profile: No results for input(s): CHOL, HDL, LDLCALC, TRIG, CHOLHDL, LDLDIRECT in the last 72 hours.  Thyroid  Function Tests: No results for input(s): TSH, T4TOTAL, FREET4, T3FREE, THYROIDAB in the last 72 hours.  Anemia Panel: No results for input(s):  VITAMINB12, FOLATE, FERRITIN, TIBC, IRON, RETICCTPCT in the last 72 hours.  Urine analysis:    Component Value Date/Time   COLORURINE YELLOW 12/11/2023 1436   APPEARANCEUR HAZY (A) 12/11/2023 1436   LABSPEC 1.023 12/11/2023 1436   PHURINE 5.0 12/11/2023 1436   GLUCOSEU >=500 (A) 12/11/2023 1436   HGBUR NEGATIVE 12/11/2023 1436   BILIRUBINUR NEGATIVE 12/11/2023 1436   BILIRUBINUR negative 04/08/2021 1413   BILIRUBINUR neg 10/11/2015 1440   KETONESUR 20 (A) 12/11/2023 1436   PROTEINUR 30 (A) 12/11/2023 1436   UROBILINOGEN 0.2 04/08/2021 1413   UROBILINOGEN 0.2 09/15/2012 0000   NITRITE NEGATIVE 12/11/2023 1436   LEUKOCYTESUR NEGATIVE 12/11/2023 1436    Sepsis Labs: Lactic Acid, Venous    Component Value Date/Time   LATICACIDVEN 1.7 11/10/2022 2146    MICROBIOLOGY: No results found for this or any previous visit (from the past 240 hours).  RADIOLOGY STUDIES/RESULTS: No results found.    LOS: 1 day   Kimberly Penna, MD  [facility]    To contact the attending provider between 7A-7P or the covering provider during after hours 7P-7A, please log into the web site [email or web address] and access using universal [place] password for that web site. If you do not have the password, please call the hospital operator.  12/13/2023, 8:48 AM

## 2023-12-13 NOTE — Discharge Summary (Addendum)
 PATIENT DETAILS Name: [name] Age: 48 y.o. Sex: female Date of Birth: Jul 05, 1975 MRN: [id number]. Admitting Physician: Alyssa Casey, MD  12/14/2023 I Took over today for discharge to psych unit Alyssa Atlas, MD   [name], [name], [place]  Admit Date: 12/11/2023 Discharge date: 12/14/2023  Recommendations for Outpatient Follow-up:  Follow up with PCP in 1-2 weeks Please obtain CMP/CBC in one week   Admitted From:  Home  Disposition: Inpatient psych   Discharge Condition: good  CODE STATUS:   Code Status: Full Code   Diet recommendation:  Diet Order             Diet - low sodium heart healthy           Diet general           Diet Carb Modified Fluid consistency: Thin; Room service appropriate? Yes  Diet effective now                    Brief Summary: Patient is a 48 y.o.  female with history of bipolar disorder/schizoaffective disorder, DM-2-who apparently ran out of insulin  2-3 weeks back-brought in with acute metabolic encephalopathy and DKA.   Significant events: 6/10>> admit to [facility]   Significant studies: 6/10>> CT head: No acute abnormality 6/10>> CXR: No pneumonia.   Significant microbiology data: None   Procedures: None   Consults: Psych   Brief Hospital Course: Acute metabolic encephalopathy Secondary to DKA Was agitated on initial arrival to the ED-overall improved but suspect that her current issues with bizarre behavior are related to psychosis.   DKA Resolved with IVF/IV insulin  Likely secondary to running out of insulin  2-3 weeks back.   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   Class 2 Obesity Estimated body mass index is 36.1 kg/m as calculated from the following:   Height as of this encounter: 5' 11 (1.803 m).   Weight as of this encounter: 117.4 kg.    Discharge Diagnoses:  Principal Problem:   DKA (diabetic ketoacidosis) (HCC)   Discharge Instructions:  Activity:  As tolerated   Discharge Instructions     Diet - low sodium heart healthy   Complete by: As directed    Diet general   Complete by: As directed    Discharge instructions   Complete by: As directed    Follow with Primary MD  Alyssa Diego L, PA in 1-2 weeks  Please get a complete blood count and chemistry panel checked by your Primary MD at your next visit, and again as instructed by your Primary MD.  Get Medicines reviewed and adjusted: Please take all your medications with you for your next visit with your Primary MD  Laboratory/radiological data: Please request your Primary MD to go over all hospital tests and procedure/radiological results at the follow up, please ask your Primary MD to get all Hospital records sent to his/her office.  In some cases, they will be blood  work, cultures and biopsy results pending at the time of your discharge. Please request that your primary care M.D. follows up on these results.  Also Note the following: If you experience worsening of your admission symptoms, develop shortness of breath, life threatening emergency, suicidal or homicidal thoughts you must seek medical attention immediately by calling 911 or calling your MD immediately  if symptoms less severe.  You must read complete instructions/literature  along with all the possible adverse reactions/side effects for all the Medicines you take and that have been prescribed to you. Take any new Medicines after you have completely understood and accpet all the possible adverse reactions/side effects.   Do not drive when taking Pain medications or sleeping medications (Benzodaizepines)  Do not take more than prescribed Pain, Sleep and Anxiety Medications. It is not advisable to combine anxiety,sleep and pain medications without talking with your primary care practitioner  Special Instructions: If you have smoked or chewed Tobacco  in the last 2 yrs please stop smoking, stop any regular Alcohol  and or any Recreational drug use.  Wear Seat belts while driving.  Please note: You were cared for by a hospitalist during your hospital stay. Once you are discharged, your primary care physician will handle any further medical issues. Please note that NO REFILLS for any discharge medications will be authorized once you are discharged, as it is imperative that you return to your primary care physician (or establish a relationship with a primary care physician if you do not have one) for your post hospital discharge needs so that they can reassess your need for medications and monitor your lab values.   Increase activity slowly   Complete by: As directed       Allergies as of 12/14/2023   No Known Allergies      Medication List     STOP taking these medications    hydrochlorothiazide  25 MG tablet Commonly known as: HYDRODIURIL    lisinopril -hydrochlorothiazide  20-25 MG tablet Commonly known as: ZESTORETIC    traZODone  50 MG tablet Commonly known as: DESYREL    Tresiba  FlexTouch 100 UNIT/ML FlexTouch Pen Generic drug: insulin  degludec       TAKE these medications    benztropine  1 MG tablet Commonly known as: COGENTIN  Take 1 tablet (1 mg total) by mouth 2 (two) times daily.   ferrous sulfate  325 (65 FE) MG tablet Take 1 tablet (325 mg  total) by mouth daily with breakfast.   haloperidol  5 MG tablet Commonly known as: HALDOL  Take 1 tablet (5 mg total) by mouth 2 (two) times daily. What changed:  medication strength how much to take when to take this reasons to take this   haloperidol  decanoate 100 MG/ML injection Commonly known as: HALDOL  DECANOATE Inject 100 mg into the muscle every 28 (twenty-eight) days.   haloperidol  lactate 5 MG/ML injection Commonly known as: HALDOL  Inject 1 mL (5 mg total) into the muscle every 6 (six) hours as needed (agitation, back-up).   hydrOXYzine  25 MG tablet Commonly known as: ATARAX  Take 1 tablet (25 mg total) by mouth every 8 (eight) hours as needed for anxiety.   insulin  aspart 100 UNIT/ML injection Commonly known as: novoLOG  Inject 4 Units into the skin 3 (three) times daily with meals.   NovoLOG  FlexPen 100 UNIT/ML FlexPen Generic drug: insulin  aspart 0-9 Units, Subcutaneous, 3 times daily with meals CBG < 70: Implement Hypoglycemia measures CBG 70 - 120: 0 units CBG 121 - 150: 1 unit CBG 151 - 200: 2 units CBG  201 - 250: 3 units CBG 251 - 300: 5 units CBG 301 - 350: 7 units CBG 351 - 400: 9 units CBG > 400: call MD   insulin  glargine-yfgn 100 UNIT/ML injection Commonly known as: SEMGLEE  Inject 0.3 mLs (30 Units total) into the skin daily.   Lancet Device Misc 1 each by Does not apply route 3 (three) times daily. May dispense any manufacturer covered by patient's insurance.   lisinopril  20 MG tablet Commonly known as: ZESTRIL  Take 20 mg by mouth daily.   LORazepam  2 MG/ML injection Commonly known as: ATIVAN  Inject 1 mL (2 mg total) into the vein every 6 (six) hours as needed (agitation).   metFORMIN  1000 MG tablet Commonly known as: GLUCOPHAGE  Take 1,000 mg by mouth 2 (two) times daily.   PARoxetine  20 MG tablet Commonly known as: PAXIL  Take 1 tablet (20 mg total) by mouth daily.   Pen Needles 31G X 5 MM Misc 1 each by Does not apply route daily.    thiamine  100 MG tablet Commonly known as: Vitamin B-1 Take 1 tablet (100 mg total) by mouth daily.   Vitamin D  (Ergocalciferol ) 1.25 MG (50000 UNIT) Caps capsule Commonly known as: DRISDOL  Take 1 capsule (50,000 Units total) by mouth every 7 (seven) days.   ziprasidone  20 MG injection Commonly known as: GEODON  Inject 20 mg into the muscle every 12 (twelve) hours as needed for agitation (Severe agitation, psychoses).        No Known Allergies   Other Procedures/Studies: CT Head Wo Contrast Result Date: 12/11/2023 CLINICAL DATA:  Altered mental status EXAM: CT HEAD WITHOUT CONTRAST TECHNIQUE: Contiguous axial images were obtained from the base of the skull through the vertex without intravenous contrast. RADIATION DOSE REDUCTION: This exam was performed according to the departmental dose-optimization program which includes automated exposure control, adjustment of the mA and/or kV according to patient size and/or use of iterative reconstruction technique. COMPARISON:  05/12/2021 FINDINGS: Brain: No evidence of acute infarction, hemorrhage, hydrocephalus, extra-axial collection or mass lesion/mass effect. Vascular: No hyperdense vessel or unexpected calcification. Skull: Normal. Negative for fracture or focal lesion. Sinuses/Orbits: No acute finding. Other: None. IMPRESSION: No acute intracranial abnormality noted. Electronically Signed   By: Violeta Grey M.D.   On: 12/11/2023 01:16   DG Chest Portable 1 View Result Date: 12/11/2023 CLINICAL DATA:  Altered mental status EXAM: PORTABLE CHEST 1 VIEW COMPARISON:  12/12/2022 FINDINGS: Heart and mediastinal contours are within normal limits. No focal opacities or effusions. No acute bony abnormality. IMPRESSION: No active cardiopulmonary disease. Electronically Signed   By: Janeece Mechanic M.D.   On: 12/11/2023 00:36     TODAY-DAY OF DISCHARGE:  Subjective:   [name] today has no headache,no chest abdominal pain,no new weakness tingling or  numbness, feels much better wants to go home today.   Objective:   Blood pressure 132/74, pulse 89, temperature 98.2 F (36.8 C), temperature source Oral, resp. rate 18, height 5' 11 (1.803 m), weight 117.4 kg, SpO2 100%.  Intake/Output Summary (Last 24 hours) at 12/14/2023 1054 Last data filed at 12/14/2023 0800 Gross per 24 hour  Intake 1080 ml  Output --  Net 1080 ml   Filed Weights   12/11/23 0018 12/11/23 1705  Weight: 126.1 kg 117.4 kg    Exam: Awake Alert, Oriented *3, No new F.N deficits, Normal affect [place].AT,PERRAL Supple Neck,No JVD, No cervical lymphadenopathy appriciated.  Symmetrical Chest wall movement, Good air movement bilaterally, CTAB RRR,No Gallops,Rubs or new Murmurs, No Parasternal Heave +  ve B.Sounds, Abd Soft, Non tender, No organomegaly appriciated, No rebound -guarding or rigidity. No Cyanosis, Clubbing or edema, No new Rash or bruise   PERTINENT RADIOLOGIC STUDIES: No results found.   PERTINENT LAB RESULTS: CBC: Recent Labs    12/11/23 1744 12/12/23 1514  WBC 9.8 8.9  HGB 12.2 11.5*  HCT 37.3 34.9*  PLT 290 269   CMET CMP     Component Value Date/Time   NA 131 ([name]) 12/12/2023 1514   NA 136 06/23/2020 1155   K 3.4 ([name]) 12/12/2023 1514   CL 100 12/12/2023 1514   CO2 21 ([name]) 12/12/2023 1514   GLUCOSE 281 (H) 12/12/2023 1514   BUN 16 12/12/2023 1514   BUN 9 06/23/2020 1155   CREATININE 0.81 12/12/2023 1514   CALCIUM  9.4 12/12/2023 1514   PROT 7.5 12/11/2023 0027   PROT 7.1 02/25/2020 0836   ALBUMIN 4.0 12/11/2023 0027   ALBUMIN 3.9 02/25/2020 0836   AST 25 12/11/2023 0027   ALT 22 12/11/2023 0027   ALKPHOS 92 12/11/2023 0027   BILITOT 2.0 (H) 12/11/2023 0027   BILITOT 0.3 02/25/2020 0836   GFR 95.65 10/02/2023 1509   GFRNONAA >60 12/12/2023 1514    GFR Estimated Creatinine Clearance: 122.5 mL/min (by C-G formula based on SCr of 0.81 mg/dL). No results for input(s): LIPASE, AMYLASE in the last 72 hours. No results for  input(s): CKTOTAL, CKMB, CKMBINDEX, TROPONINI in the last 72 hours. Invalid input(s): POCBNP No results for input(s): DDIMER in the last 72 hours. No results for input(s): HGBA1C in the last 72 hours.  No results for input(s): CHOL, HDL, LDLCALC, TRIG, CHOLHDL, LDLDIRECT in the last 72 hours. No results for input(s): TSH, T4TOTAL, T3FREE, THYROIDAB in the last 72 hours.  Invalid input(s): FREET3 Recent Labs    12/13/23 0743  VITAMINB12 522  FOLATE 21.3   Coags: No results for input(s): INR in the last 72 hours.  Invalid input(s): PT Microbiology: No results found for this or any previous visit (from the past 240 hours).  FURTHER DISCHARGE INSTRUCTIONS:  Get Medicines reviewed and adjusted: Please take all your medications with you for your next visit with your Primary MD  Laboratory/radiological data: Please request your Primary MD to go over all hospital tests and procedure/radiological results at the follow up, please ask your Primary MD to get all Hospital records sent to his/her office.  In some cases, they will be blood work, cultures and biopsy results pending at the time of your discharge. Please request that your primary care M.D. goes through all the records of your hospital data and follows up on these results.  Also Note the following: If you experience worsening of your admission symptoms, develop shortness of breath, life threatening emergency, suicidal or homicidal thoughts you must seek medical attention immediately by calling 911 or calling your MD immediately  if symptoms less severe.  You must read complete instructions/literature along with all the possible adverse reactions/side effects for all the Medicines you take and that have been prescribed to you. Take any new Medicines after you have completely understood and accpet all the possible adverse reactions/side effects.   Do not drive when taking Pain medications or  sleeping medications (Benzodaizepines)  Do not take more than prescribed Pain, Sleep and Anxiety Medications. It is not advisable to combine anxiety,sleep and pain medications without talking with your primary care practitioner  Special Instructions: If you have smoked or chewed Tobacco  in the last 2 yrs please stop smoking,  stop any regular Alcohol  and or any Recreational drug use.  Wear Seat belts while driving.  Please note: You were cared for by a hospitalist during your hospital stay. Once you are discharged, your primary care physician will handle any further medical issues. Please note that NO REFILLS for any discharge medications will be authorized once you are discharged, as it is imperative that you return to your primary care physician (or establish a relationship with a primary care physician if you do not have one) for your post hospital discharge needs so that they can reassess your need for medications and monitor your lab values.  Total Time spent coordinating discharge including counseling, education and face to face time equals greater than 30 minutes.  Signed: Althia [name] 12/14/2023 10:54 AM

## 2023-12-13 NOTE — TOC Progression Note (Addendum)
 Transition of Care [facility]) - Progression Note    Patient Details  Name: [name] MRN: [id number] Date of Birth: Jun 28, 1976  [facility]) CM/SW Contact  Jannice Mends, LCSW Phone Number: 12/13/2023, 10:28 AM  Clinical Narrative:    10:28 AM-[facility] [place] reviewing referral.   Faxed to [facility].  1:24 PM-[facility] [facility] going to speak with pt's RN.  1:41 PM-CSW received call from Eboni with [facility] and she confirmed they can take patient today after negative pregnancy test is faxed to them. She will go to [address]. DC Summary to go with her.  Will need to be Sheriff transport due to IVC.  CSW left vm for patient's mother.   3:51 PM-Patient's mother returned call and CSW provided info for [facility]. She reported agreement with plan. [place].    4:23 PM-CSW faxed negative pregnancy test to [facility] and they confirmed receipt. Report # given to RN. CSW left voicemail for Sheriff's office to see if they can transport patient today.   Per [facility], they can still accept patient tomorrow if need be.    6:51 PM-Sheriff still has not returned call. CSW updated MD and spoke with Ava at [facility].    Expected Discharge Plan: Psychiatric Hospital Barriers to Discharge: Psych Bed not available  Expected Discharge Plan and Services In-house Referral: Clinical Social Work                                             Social Determinants of Health (SDOH) Interventions SDOH Screenings   Food Insecurity: No Food Insecurity (12/12/2023)  Housing: High Risk (12/12/2023)  Transportation Needs: Unmet Transportation Needs (12/12/2023)  Utilities: At Risk (12/12/2023)  Alcohol Screen: Low Risk  (08/22/2023)  Depression (PHQ2-9): High Risk (10/05/2023)  Financial Resource Strain: Medium Risk (08/22/2023)  Physical Activity: Insufficiently Active (08/22/2023)  Social Connections: Moderately Integrated (08/22/2023)  Stress: Stress Concern  Present (08/22/2023)  Tobacco Use: Medium Risk (12/11/2023)  Health Literacy: Low Risk  (11/16/2022)   Received from [facility]    Readmission Risk Interventions     No data to display

## 2023-12-14 LAB — GLUCOSE, CAPILLARY
Glucose-Capillary: 262 mg/dL — ABNORMAL HIGH (ref 70–99)
Glucose-Capillary: 279 mg/dL — ABNORMAL HIGH (ref 70–99)

## 2023-12-14 NOTE — Plan of Care (Signed)
 Problem: Education: Goal: Ability to describe self-care measures that may prevent or decrease complications (Diabetes Survival Skills Education) will improve Outcome: Adequate for Discharge   Problem: Coping: Goal: Ability to adjust to condition or change in health will improve Outcome: Adequate for Discharge   Problem: Fluid Volume: Goal: Ability to maintain a balanced intake and output will improve Outcome: Adequate for Discharge   Problem: Health Behavior/Discharge Planning: Goal: Ability to identify and utilize available resources and services will improve Outcome: Adequate for Discharge Goal: Ability to manage health-related needs will improve Outcome: Adequate for Discharge   Problem: Metabolic: Goal: Ability to maintain appropriate glucose levels will improve Outcome: Adequate for Discharge   Problem: Nutritional: Goal: Maintenance of adequate nutrition will improve Outcome: Adequate for Discharge Goal: Progress toward achieving an optimal weight will improve Outcome: Adequate for Discharge   Problem: Skin Integrity: Goal: Risk for impaired skin integrity will decrease Outcome: Adequate for Discharge   Problem: Tissue Perfusion: Goal: Adequacy of tissue perfusion will improve Outcome: Adequate for Discharge   Problem: Education: Goal: Ability to describe self-care measures that may prevent or decrease complications (Diabetes Survival Skills Education) will improve Outcome: Adequate for Discharge Goal: Individualized Educational Video(s) Outcome: Adequate for Discharge   Problem: Cardiac: Goal: Ability to maintain an adequate cardiac output will improve Outcome: Adequate for Discharge   Problem: Health Behavior/Discharge Planning: Goal: Ability to identify and utilize available resources and services will improve Outcome: Adequate for Discharge Goal: Ability to manage health-related needs will improve Outcome: Adequate for Discharge   Problem: Fluid  Volume: Goal: Ability to achieve a balanced intake and output will improve Outcome: Adequate for Discharge   Problem: Metabolic: Goal: Ability to maintain appropriate glucose levels will improve Outcome: Adequate for Discharge   Problem: Nutritional: Goal: Maintenance of adequate nutrition will improve Outcome: Adequate for Discharge Goal: Maintenance of adequate weight for body size and type will improve Outcome: Adequate for Discharge   Problem: Respiratory: Goal: Will regain and/or maintain adequate ventilation Outcome: Adequate for Discharge   Problem: Urinary Elimination: Goal: Ability to achieve and maintain adequate renal perfusion and functioning will improve Outcome: Adequate for Discharge   Problem: Education: Goal: Knowledge of General Education information will improve Description: Including pain rating scale, medication(s)/side effects and non-pharmacologic comfort measures Outcome: Adequate for Discharge   Problem: Health Behavior/Discharge Planning: Goal: Ability to manage health-related needs will improve Outcome: Adequate for Discharge   Problem: Clinical Measurements: Goal: Ability to maintain clinical measurements within normal limits will improve Outcome: Adequate for Discharge Goal: Will remain free from infection Outcome: Adequate for Discharge Goal: Diagnostic test results will improve Outcome: Adequate for Discharge Goal: Respiratory complications will improve Outcome: Adequate for Discharge Goal: Cardiovascular complication will be avoided Outcome: Adequate for Discharge   Problem: Activity: Goal: Risk for activity intolerance will decrease Outcome: Adequate for Discharge   Problem: Nutrition: Goal: Adequate nutrition will be maintained Outcome: Adequate for Discharge   Problem: Coping: Goal: Level of anxiety will decrease Outcome: Adequate for Discharge   Problem: Elimination: Goal: Will not experience complications related to bowel  motility Outcome: Adequate for Discharge Goal: Will not experience complications related to urinary retention Outcome: Adequate for Discharge   Problem: Pain Managment: Goal: General experience of comfort will improve and/or be controlled Outcome: Adequate for Discharge   Problem: Safety: Goal: Ability to remain free from injury will improve Outcome: Adequate for Discharge   Problem: Skin Integrity: Goal: Risk for impaired skin integrity will decrease Outcome: Adequate for Discharge

## 2023-12-14 NOTE — Progress Notes (Addendum)
 Attempted to call report for next level of care to [phone], was told that a RN would call me back when they were able. Esti Demello S Ladislav Caselli   1034 Update-Report given to [employer] at inpatient psych facility in [place]. Dystany Duffy S Marylu Dudenhoeffer

## 2023-12-14 NOTE — TOC Transition Note (Signed)
 Transition of Care [facility]) - Discharge Note   Patient Details  Name: [name] MRN: [id number] Date of Birth: Aug 06, 1975  [facility]) CM/SW Contact:  Jannice Mends, LCSW Phone Number: 12/14/2023, 9:08 AM   Clinical Narrative:    Sheriff able to transport patient after 10am today. Requesting EMTALA form and will call the nursing station when they are on their way.  CSW spoke with Elgin Grit at [facility] and she requested report be given again [phone], option 1), hall [address], Accepting MD is Dr. Emilie Harden. DC Summary to be sent with patient (on hard chart).  Patient's mother aware of discharge. No further TOC needs identified at this time.    Final next level of care: Psychiatric Hospital Barriers to Discharge: Barriers Resolved   Patient Goals and CMS Choice            Discharge Placement              Patient chooses bed at:  [facility])   Name of family member notified: Mother Patient and family notified of of transfer: 12/14/23  Discharge Plan and Services Additional resources added to the After Visit Summary for   In-house Referral: Clinical Social Work                                   Social Drivers of Health (SDOH) Interventions SDOH Screenings   Food Insecurity: No Food Insecurity (12/12/2023)  Housing: High Risk (12/12/2023)  Transportation Needs: Unmet Transportation Needs (12/12/2023)  Utilities: At Risk (12/12/2023)  Alcohol Screen: Low Risk  (08/22/2023)  Depression (PHQ2-9): High Risk (10/05/2023)  Financial Resource Strain: Medium Risk (08/22/2023)  Physical Activity: Insufficiently Active (08/22/2023)  Social Connections: Moderately Integrated (08/22/2023)  Stress: Stress Concern Present (08/22/2023)  Tobacco Use: Medium Risk (12/11/2023)  Health Literacy: Low Risk  (11/16/2022)   Received from [facility]     Readmission Risk Interventions     No data to display

## 2024-01-13 ENCOUNTER — Inpatient Hospital Stay (HOSPITAL_COMMUNITY)
Admission: EM | Admit: 2024-01-13 | Discharge: 2024-01-15 | DRG: 638 | Disposition: A | Discharge status: Home / Self Care | Attending: Internal Medicine | Admitting: Internal Medicine

## 2024-01-13 ENCOUNTER — Emergency Department (HOSPITAL_COMMUNITY)

## 2024-01-13 ENCOUNTER — Other Ambulatory Visit: Payer: Self-pay

## 2024-01-13 ENCOUNTER — Encounter (HOSPITAL_COMMUNITY): Payer: Self-pay

## 2024-01-13 DIAGNOSIS — F3164 Bipolar disorder, current episode mixed, severe, with psychotic features: Secondary | ICD-10-CM | POA: Diagnosis not present

## 2024-01-13 DIAGNOSIS — G9349 Other encephalopathy: Secondary | ICD-10-CM | POA: Diagnosis present

## 2024-01-13 DIAGNOSIS — Z79899 Other long term (current) drug therapy: Secondary | ICD-10-CM | POA: Diagnosis not present

## 2024-01-13 DIAGNOSIS — R000 Tachycardia, unspecified: Secondary | ICD-10-CM | POA: Diagnosis present

## 2024-01-13 DIAGNOSIS — F319 Bipolar disorder, unspecified: Secondary | ICD-10-CM | POA: Diagnosis present

## 2024-01-13 DIAGNOSIS — E876 Hypokalemia: Secondary | ICD-10-CM | POA: Diagnosis present

## 2024-01-13 DIAGNOSIS — Z7984 Long term (current) use of oral hypoglycemic drugs: Secondary | ICD-10-CM | POA: Diagnosis not present

## 2024-01-13 DIAGNOSIS — R55 Syncope and collapse: Secondary | ICD-10-CM | POA: Diagnosis not present

## 2024-01-13 DIAGNOSIS — I10 Essential (primary) hypertension: Secondary | ICD-10-CM | POA: Diagnosis present

## 2024-01-13 DIAGNOSIS — R739 Hyperglycemia, unspecified: Secondary | ICD-10-CM

## 2024-01-13 DIAGNOSIS — N179 Acute kidney failure, unspecified: Secondary | ICD-10-CM | POA: Diagnosis present

## 2024-01-13 DIAGNOSIS — F259 Schizoaffective disorder, unspecified: Secondary | ICD-10-CM | POA: Diagnosis present

## 2024-01-13 DIAGNOSIS — Z794 Long term (current) use of insulin: Secondary | ICD-10-CM | POA: Diagnosis not present

## 2024-01-13 DIAGNOSIS — E1165 Type 2 diabetes mellitus with hyperglycemia: Secondary | ICD-10-CM | POA: Diagnosis present

## 2024-01-13 DIAGNOSIS — E1100 Type 2 diabetes mellitus with hyperosmolarity without nonketotic hyperglycemic-hyperosmolar coma (NKHHC): Secondary | ICD-10-CM | POA: Diagnosis not present

## 2024-01-13 DIAGNOSIS — Z833 Family history of diabetes mellitus: Secondary | ICD-10-CM | POA: Diagnosis not present

## 2024-01-13 DIAGNOSIS — Z87891 Personal history of nicotine dependence: Secondary | ICD-10-CM

## 2024-01-13 LAB — BASIC METABOLIC PANEL WITH GFR
Anion gap: 13 (ref 5–15)
Anion gap: 14 (ref 5–15)
BUN: 48 mg/dL — ABNORMAL HIGH (ref 6–20)
BUN: 45 mg/dL — ABNORMAL HIGH (ref 6–20)
CO2: 21 mmol/L — ABNORMAL LOW (ref 22–32)
CO2: 21 mmol/L — ABNORMAL LOW (ref 22–32)
Calcium: 10.1 mg/dL (ref 8.9–10.3)
Calcium: 9.7 mg/dL (ref 8.9–10.3)
Chloride: 99 mmol/L (ref 98–111)
Chloride: 99 mmol/L (ref 98–111)
Creatinine, Ser: 1.93 mg/dL — ABNORMAL HIGH (ref 0.44–1.00)
Creatinine, Ser: 1.6 mg/dL — ABNORMAL HIGH (ref 0.44–1.00)
GFR, Estimated: 32 mL/min — ABNORMAL LOW (ref >60)
GFR, Estimated: 40 mL/min — ABNORMAL LOW (ref >60)
Glucose, Bld: 609 mg/dL (ref 70–99)
Glucose, Bld: 523 mg/dL (ref 70–99)
Potassium: 4.3 mmol/L (ref 3.5–5.1)
Potassium: 3.2 mmol/L — ABNORMAL LOW (ref 3.5–5.1)
Sodium: 133 mmol/L — ABNORMAL LOW (ref 135–145)
Sodium: 134 mmol/L — ABNORMAL LOW (ref 135–145)

## 2024-01-13 LAB — URINALYSIS, ROUTINE W REFLEX MICROSCOPIC
Bacteria, UA: NONE SEEN
Bilirubin Urine: NEGATIVE
Glucose, UA: >=500 mg/dL — AB
Ketones, ur: 5 mg/dL — AB
Leukocytes,Ua: NEGATIVE
Nitrite: NEGATIVE
Protein, ur: NEGATIVE mg/dL
Specific Gravity, Urine: 1.024 (ref 1.005–1.030)
pH: 5 (ref 5.0–8.0)

## 2024-01-13 LAB — CBC WITH DIFFERENTIAL/PLATELET
Abs Immature Granulocytes: 0.05 K/uL (ref 0.00–0.07)
Basophils Absolute: 0 K/uL (ref 0.0–0.1)
Basophils Relative: 0 %
Eosinophils Absolute: 0 K/uL (ref 0.0–0.5)
Eosinophils Relative: 0 %
HCT: 35.9 % — ABNORMAL LOW (ref 36.0–46.0)
Hemoglobin: 11.5 g/dL — ABNORMAL LOW (ref 12.0–15.0)
Immature Granulocytes: 1 %
Lymphocytes Relative: 8 %
Lymphs Abs: 0.8 K/uL (ref 0.7–4.0)
MCH: 25.8 pg — ABNORMAL LOW (ref 26.0–34.0)
MCHC: 32 g/dL (ref 30.0–36.0)
MCV: 80.7 fL (ref 80.0–100.0)
Monocytes Absolute: 0.5 K/uL (ref 0.1–1.0)
Monocytes Relative: 5 %
Neutro Abs: 8.7 K/uL — ABNORMAL HIGH (ref 1.7–7.7)
Neutrophils Relative %: 86 %
Platelets: 309 K/uL (ref 150–400)
RBC: 4.45 MIL/uL (ref 3.87–5.11)
RDW: 13.8 % (ref 11.5–15.5)
WBC: 10 K/uL (ref 4.0–10.5)
nRBC: 0 % (ref 0.0–0.2)

## 2024-01-13 LAB — BLOOD GAS, VENOUS
Acid-base deficit: 2.6 mmol/L — ABNORMAL HIGH (ref 0.0–2.0)
Bicarbonate: 23.2 mmol/L (ref 20.0–28.0)
O2 Saturation: 50.5 %
Patient temperature: 37
pCO2, Ven: 43 mmHg — ABNORMAL LOW (ref 44–60)
pH, Ven: 7.34 (ref 7.25–7.43)
pO2, Ven: 33 mmHg (ref 32–45)

## 2024-01-13 LAB — CBG MONITORING, ED
Glucose-Capillary: >600 mg/dL (ref 70–99)
Glucose-Capillary: 511 mg/dL (ref 70–99)

## 2024-01-13 LAB — GLUCOSE, CAPILLARY: Glucose-Capillary: 351 mg/dL — ABNORMAL HIGH (ref 70–99)

## 2024-01-13 LAB — HCG, SERUM, QUALITATIVE: Preg, Serum: NEGATIVE

## 2024-01-13 LAB — BETA-HYDROXYBUTYRIC ACID: Beta-Hydroxybutyric Acid: 1.05 mmol/L — ABNORMAL HIGH (ref 0.05–0.27)

## 2024-01-13 MED ORDER — SODIUM CHLORIDE 0.9% FLUSH: 10.0000 mL | INTRAVENOUS | Status: DC | PRN

## 2024-01-13 MED ORDER — HALOPERIDOL 5 MG PO TABS
5.0000 mg | ORAL_TABLET | Freq: Two times a day (BID) | ORAL | Status: DC
  Administered 2024-01-14 – 2024-01-15 (×3): 5 mg via ORAL
  Filled 2024-01-13 (×2): qty 5
  Filled 2024-01-13 (×2): qty 1

## 2024-01-13 MED ORDER — LACTATED RINGERS IV BOLUS
20.0000 mL/kg | Freq: Once | INTRAVENOUS | Status: AC
  Administered 2024-01-13: 2314 mL via INTRAVENOUS

## 2024-01-13 MED ORDER — DEXTROSE 50 % IV SOLN: 0.0000 mL | INTRAVENOUS | Status: DC | PRN

## 2024-01-13 MED ORDER — POTASSIUM CHLORIDE 10 MEQ/100ML IV SOLN
10.0000 meq | INTRAVENOUS | Status: AC
  Administered 2024-01-13 (×2): 10 meq via INTRAVENOUS
  Filled 2024-01-13 (×2): qty 100

## 2024-01-13 MED ORDER — LACTATED RINGERS IV SOLN: INTRAVENOUS | Status: AC

## 2024-01-13 MED ORDER — INSULIN REGULAR(HUMAN) IN NACL 100-0.9 UT/100ML-% IV SOLN: INTRAVENOUS | Status: DC

## 2024-01-13 MED ORDER — DEXTROSE IN LACTATED RINGERS 5 % IV SOLN: INTRAVENOUS | Status: DC

## 2024-01-13 MED ORDER — HALOPERIDOL LACTATE 5 MG/ML IJ SOLN
5.0000 mg | Freq: Four times a day (QID) | INTRAMUSCULAR | Status: DC | PRN
  Administered 2024-01-13: 5 mg via INTRAVENOUS
  Filled 2024-01-13: qty 1

## 2024-01-13 MED ORDER — ENOXAPARIN SODIUM 40 MG/0.4ML IJ SOSY
40.0000 mg | PREFILLED_SYRINGE | INTRAMUSCULAR | Status: DC
  Administered 2024-01-13 – 2024-01-14 (×2): 40 mg via SUBCUTANEOUS
  Filled 2024-01-13 (×2): qty 0.4

## 2024-01-13 MED ORDER — DEXTROSE IN LACTATED RINGERS 5 % IV SOLN: INTRAVENOUS | Status: AC

## 2024-01-13 MED ORDER — INSULIN REGULAR(HUMAN) IN NACL 100-0.9 UT/100ML-% IV SOLN
INTRAVENOUS | Status: DC
  Administered 2024-01-13: 13 [IU]/h via INTRAVENOUS
  Filled 2024-01-13: qty 100

## 2024-01-13 MED ORDER — HYDROXYZINE HCL 25 MG PO TABS: 25.0000 mg | ORAL_TABLET | Freq: Three times a day (TID) | ORAL | Status: DC | PRN

## 2024-01-13 MED ORDER — LACTATED RINGERS IV SOLN: INTRAVENOUS | Status: DC

## 2024-01-13 MED ORDER — LORAZEPAM 2 MG/ML IJ SOLN
1.0000 mg | Freq: Once | INTRAMUSCULAR | Status: AC
  Administered 2024-01-13: 1 mg via INTRAVENOUS
  Filled 2024-01-13: qty 1

## 2024-01-13 MED ORDER — CHLORHEXIDINE GLUCONATE CLOTH 2 % EX PADS
6.0000 | MEDICATED_PAD | Freq: Every day | CUTANEOUS | Status: DC
  Administered 2024-01-14 – 2024-01-15 (×2): 6 via TOPICAL

## 2024-01-13 NOTE — ED Notes (Signed)
 Only able to obtain enough blood for VBG despite multiple sticks. Pt has only 1 PIV which does not draw back blood. Will place IV team order.

## 2024-01-13 NOTE — ED Triage Notes (Signed)
 Pt BIB [facility] from home. Per EMS, pt called [facility] for VS check, and she was found to have CBG >600, so they called EMS for transport to hospital. Pt is altered, not making any sense on arrival to ED.

## 2024-01-13 NOTE — H&P (Signed)
 History and Physical    [name] FMW:[id number] DOB: 1975-11-04 DOA: 01/13/2024  PCP: No primary care provider on file.   Patient coming from: Home   Chief Complaint: AMS, hyperglcyemia   HPI: [name] is a 48 y.o. female with medical history significant for bipolar disorder, hypertension, and insulin -dependent diabetes mellitus who called EMS today stating that she needed her vitals checked, was found to be agitated and confused with CBG >600 and was brought into the ED.  Patient is confused about what transpired today.  She states that she was on her way to the swimming pool, and then the story becomes very confusing and inconsistent.  She denies any pain, shortness of breath, fever, or chills.  Per EMS report, the patient had called them for a vital sign check.  Her sugar was >600 in the field and she was brought into the ED.   ED Course: Upon arrival to the ED, patient is found to be afebrile and saturating well on room air with mild tachycardia and stable blood pressure.  Labs are most notable for glucose 609, creatinine 1.93, normal WBC, and beta-hydroxybutyrate 1.05.  There are no acute findings on chest x-ray or head CT.  Patient was given 2.3 L of LR, Ativan , and was started on insulin  infusion.  Review of Systems:  Unable to complete ROS secondary to the patient's clinical condition.  Past Medical History:  Diagnosis Date   Abnormal Pap smear    Bipolar 1 disorder (HCC)    Diabetes mellitus type I (HCC)    Diabetes mellitus without complication (HCC)    Fibroids    Hypertension    IBS (irritable bowel syndrome)     Past Surgical History:  Procedure Laterality Date   CERVICAL BIOPSY     CHOLECYSTECTOMY     ESSURE TUBAL LIGATION     HIATAL HERNIA REPAIR     TUBAL LIGATION      Social History:   reports that she has quit smoking. Her smoking use included cigarettes. She has never used smokeless tobacco. She reports current alcohol use. She reports that she does not  currently use drugs.  No Known Allergies  Family History  Problem Relation Age of Onset   Diabetes Father    Diabetes Paternal Grandmother    Diabetes Maternal Grandmother    Mental illness Cousin    Healthy Mother      Prior to Admission medications   Medication Sig Start Date End Date Taking? Authorizing Provider  benztropine  (COGENTIN ) 1 MG tablet Take 1 tablet (1 mg total) by mouth 2 (two) times daily. 12/21/22   Tanda Bleacher, MD  ferrous sulfate  325 (65 FE) MG tablet Take 1 tablet (325 mg total) by mouth daily with breakfast. 02/16/23   Tanda Bleacher, MD  haloperidol  (HALDOL ) 5 MG tablet Take 1 tablet (5 mg total) by mouth 2 (two) times daily. 12/13/23   Ghimire, Donalda HERO, MD  haloperidol  decanoate (HALDOL  DECANOATE) 100 MG/ML injection Inject 100 mg into the muscle every 28 (twenty-eight) days. 10/19/23   [provider]  haloperidol  lactate (HALDOL ) 5 MG/ML injection Inject 1 mL (5 mg total) into the muscle every 6 (six) hours as needed (agitation, back-up). 12/13/23   Ghimire, Donalda HERO, MD  hydrOXYzine  (ATARAX ) 25 MG tablet Take 1 tablet (25 mg total) by mouth every 8 (eight) hours as needed for anxiety. 12/13/23   Ghimire, Donalda HERO, MD  insulin  aspart (NOVOLOG  FLEXPEN) 100 UNIT/ML FlexPen 0-9 Units, Subcutaneous, 3 times daily  with meals CBG < 70: Implement Hypoglycemia measures CBG 70 - 120: 0 units CBG 121 - 150: 1 unit CBG 151 - 200: 2 units CBG 201 - 250: 3 units CBG 251 - 300: 5 units CBG 301 - 350: 7 units CBG 351 - 400: 9 units CBG > 400: call MD 12/13/23   Raenelle Donalda HERO, MD  insulin  aspart (NOVOLOG ) 100 UNIT/ML injection Inject 4 Units into the skin 3 (three) times daily with meals. 12/13/23   Ghimire, Donalda HERO, MD  insulin  glargine-yfgn (SEMGLEE ) 100 UNIT/ML injection Inject 0.3 mLs (30 Units total) into the skin daily. 12/14/23   Ghimire, Donalda HERO, MD  Insulin  Pen Needle (PEN NEEDLES) 31G X 5 MM MISC 1 each by Does not apply route daily. 10/02/23   Crain,  Whitney L, PA  Lancet Device MISC 1 each by Does not apply route 3 (three) times daily. May dispense any manufacturer covered by patient's insurance. 12/21/22   Tanda Bleacher, MD  lisinopril  (ZESTRIL ) 20 MG tablet Take 20 mg by mouth daily. 10/18/23   [provider]  LORazepam  (ATIVAN ) 2 MG/ML injection Inject 1 mL (2 mg total) into the vein every 6 (six) hours as needed (agitation). 12/13/23   Ghimire, Donalda HERO, MD  metFORMIN  (GLUCOPHAGE ) 1000 MG tablet Take 1,000 mg by mouth 2 (two) times daily. 10/18/23   [provider]  PARoxetine  (PAXIL ) 20 MG tablet Take 1 tablet (20 mg total) by mouth daily. 12/21/22   Tanda Bleacher, MD  thiamine  (VITAMIN B-1) 100 MG tablet Take 1 tablet (100 mg total) by mouth daily. 12/14/23   Ghimire, Donalda HERO, MD  Vitamin D , Ergocalciferol , (DRISDOL ) 1.25 MG (50000 UNIT) CAPS capsule Take 1 capsule (50,000 Units total) by mouth every 7 (seven) days. 12/13/23   Ghimire, Donalda HERO, MD  ziprasidone  (GEODON ) 20 MG injection Inject 20 mg into the muscle every 12 (twelve) hours as needed for agitation (Severe agitation, psychoses). 12/13/23   Raenelle Donalda HERO, MD    Physical Exam: Vitals:   01/13/24 1824 01/13/24 1900 01/13/24 1930 01/13/24 2000  BP:  (!) 148/109 (!) 148/75 (!) 150/80  Pulse:   (!) 103 (!) 104  Resp:  (!) 21 14 11   Temp:      TempSrc:      SpO2:   96% 99%  Weight: 115.7 kg     Height: 5' 11 (1.803 m)       Constitutional: NAD, no pallor or diaphoresis   Eyes: PERTLA, lids and conjunctivae normal ENMT: Mucous membranes are moist. Posterior pharynx clear of any exudate or lesions.   Neck: supple, no masses  Respiratory: no wheezing, no crackles. No accessory muscle use.  Cardiovascular: S1 & S2 heard, regular rate and rhythm. No JVD. Abdomen: No distension, no tenderness, soft. Bowel sounds active.  Musculoskeletal: no clubbing / cyanosis. No joint deformity upper and lower extremities.   Skin: no significant rashes, lesions,  ulcers. Warm, dry, well-perfused. Neurologic: CN 2-12 grossly intact. Moving all extremities. Alert and oriented to person and place only.  Psychiatric: Restless, confused. Intermittently cooperative.    Labs and Imaging on Admission: I have personally reviewed following labs and imaging studies  CBC: Recent Labs  Lab 01/13/24 2039  WBC 10.0  NEUTROABS 8.7*  HGB 11.5*  HCT 35.9*  MCV 80.7  PLT 309   Basic Metabolic Panel: Recent Labs  Lab 01/13/24 2039  NA 133*  K 4.3  CL 99  CO2 21*  GLUCOSE 609*  BUN 48*  CREATININE 1.93*  CALCIUM  10.1   GFR: Estimated Creatinine Clearance: 51.1 mL/min (A) (by C-G formula based on SCr of 1.93 mg/dL (H)). Liver Function Tests: No results for input(s): AST, ALT, ALKPHOS, BILITOT, PROT, ALBUMIN in the last 168 hours. No results for input(s): LIPASE, AMYLASE in the last 168 hours. No results for input(s): AMMONIA in the last 168 hours. Coagulation Profile: No results for input(s): INR, PROTIME in the last 168 hours. Cardiac Enzymes: No results for input(s): CKTOTAL, CKMB, CKMBINDEX, TROPONINI in the last 168 hours. BNP (last 3 results) No results for input(s): PROBNP in the last 8760 hours. HbA1C: No results for input(s): HGBA1C in the last 72 hours. CBG: Recent Labs  Lab 01/13/24 1824  GLUCAP >600*   Lipid Profile: No results for input(s): CHOL, HDL, LDLCALC, TRIG, CHOLHDL, LDLDIRECT in the last 72 hours. Thyroid  Function Tests: No results for input(s): TSH, T4TOTAL, FREET4, T3FREE, THYROIDAB in the last 72 hours. Anemia Panel: No results for input(s): VITAMINB12, FOLATE, FERRITIN, TIBC, IRON, RETICCTPCT in the last 72 hours. Urine analysis:    Component Value Date/Time   COLORURINE YELLOW 12/11/2023 1436   APPEARANCEUR HAZY (A) 12/11/2023 1436   LABSPEC 1.023 12/11/2023 1436   PHURINE 5.0 12/11/2023 1436   GLUCOSEU >=500 (A) 12/11/2023 1436   HGBUR  NEGATIVE 12/11/2023 1436   BILIRUBINUR NEGATIVE 12/11/2023 1436   BILIRUBINUR negative 04/08/2021 1413   BILIRUBINUR neg 10/11/2015 1440   KETONESUR 20 (A) 12/11/2023 1436   PROTEINUR 30 (A) 12/11/2023 1436   UROBILINOGEN 0.2 04/08/2021 1413   UROBILINOGEN 0.2 09/15/2012 0000   NITRITE NEGATIVE 12/11/2023 1436   LEUKOCYTESUR NEGATIVE 12/11/2023 1436   Sepsis Labs: @LABRCNTIP (procalcitonin:4,lacticidven:4) )No results found for this or any previous visit (from the past 240 hours).   Radiological Exams on Admission: CT HEAD WO CONTRAST ( ) Result Date: 01/13/2024 CLINICAL DATA:  Mental status change, unknown cause EXAM: CT HEAD WITHOUT CONTRAST TECHNIQUE: Contiguous axial images were obtained from the base of the skull through the vertex without intravenous contrast. RADIATION DOSE REDUCTION: This exam was performed according to the departmental dose-optimization program which includes automated exposure control, adjustment of the mA and/or kV according to patient size and/or use of iterative reconstruction technique. COMPARISON:  CT head 01/10/2024. FINDINGS: Brain: No evidence of acute infarction, hemorrhage, hydrocephalus, extra-axial collection or mass lesion/mass effect. Vascular: No hyperdense vessel. Skull: No acute fracture. Sinuses/Orbits: Clear sinuses.  No acute orbital findings. Other: No mastoid effusions. IMPRESSION: No evidence of acute intracranial abnormality. Electronically Signed   By: Gilmore GORMAN Molt M.D.   On: 01/13/2024 21:16   DG Chest Port 1 View Result Date: 01/13/2024 CLINICAL DATA:  Altered mental status EXAM: PORTABLE CHEST 1 VIEW COMPARISON:  Chest x-ray 12/11/2023 FINDINGS: The heart size and mediastinal contours are within normal limits. Both lungs are clear. The visualized skeletal structures are unremarkable. IMPRESSION: No active disease. Electronically Signed   By: Greig Pique M.D.   On: 01/13/2024 18:48    EKG: Independently reviewed. Sinus tachycardia,  rate 101, LVH.   Assessment/Plan   1. Hyperglycemia, ?HHS; insulin -dependent type II DM  - A1c was 10.9% in June 2025; serum glucose is 609 in ED with serum bicarb 18, normal AG, and mildly elevated BHOB - Check serum osm, continue IVF hydration and IV insulin  infusion with frequent CBGs and serial chem panels    2. Acute encephalopathy; bipolar disorder  - No acute findings on head CT  - Correct metabolic derangements, use delirium precautions and safety sitter, continue  scheduled Haldol , use as-needed Haldol  and hydroxyzine     3. AKI  - SCr is 1.93, up from 0.81 last month  - Hold lisinopril , continue IVF hydration, repeat chem panel    4. Hypertension  - Hold lisinopril , treat as-needed only for now     DVT prophylaxis: Lovenox  Code Status: Full  Level of Care: Level of care: Stepdown Family Communication: None present   Disposition Plan:  Patient is from: home  Anticipated d/c is to: TBD Anticipated d/c date is: 01/15/24  Patient currently: Pending glycemic-control, improved renal function, improved mental status or psychiatry consultation  Consults called: None  Admission status: Inpatient     Evalene GORMAN Sprinkles, MD [facility]  01/13/2024, 9:53 PM

## 2024-01-13 NOTE — ED Provider Notes (Signed)
 [place] EMERGENCY DEPARTMENT AT [facility] Provider Note   CSN: [id number] Arrival date & time: 01/13/24  1806     Patient presents with: Altered Mental Status   [name] is a 48 y.o. female history of schizophrenia, diabetes here presenting with altered mental status.  Patient was recently admitted for DKA and also psychosis.  Patient was first admitted to the medical service and then went to behavioral health.  Patient was found rolling in her yard and was agitated.  She called EMS to check her vitals and they noticed that her CBG was greater than 600.  Patient cannot give me anything meaningful.  She states that someone took her ID and she was detained for some reason.  Patient also has significant paranoia and unable to give me any meaningful history.  Patient cannot tell me if she is taking her insulin  or not   The history is provided by the patient.       Prior to Admission medications   Medication Sig Start Date End Date Taking? Authorizing Provider  benztropine  (COGENTIN ) 1 MG tablet Take 1 tablet (1 mg total) by mouth 2 (two) times daily. 12/21/22   Tanda Bleacher, MD  ferrous sulfate  325 (65 FE) MG tablet Take 1 tablet (325 mg total) by mouth daily with breakfast. 02/16/23   Tanda Bleacher, MD  haloperidol  (HALDOL ) 5 MG tablet Take 1 tablet (5 mg total) by mouth 2 (two) times daily. 12/13/23   Ghimire, Donalda HERO, MD  haloperidol  decanoate (HALDOL  DECANOATE) 100 MG/ML injection Inject 100 mg into the muscle every 28 (twenty-eight) days. 10/19/23   [provider]  haloperidol  lactate (HALDOL ) 5 MG/ML injection Inject 1 mL (5 mg total) into the muscle every 6 (six) hours as needed (agitation, back-up). 12/13/23   Ghimire, Donalda HERO, MD  hydrOXYzine  (ATARAX ) 25 MG tablet Take 1 tablet (25 mg total) by mouth every 8 (eight) hours as needed for anxiety. 12/13/23   Ghimire, Donalda HERO, MD  insulin  aspart (NOVOLOG  FLEXPEN) 100 UNIT/ML FlexPen 0-9 Units, Subcutaneous,  3 times daily with meals CBG < 70: Implement Hypoglycemia measures CBG 70 - 120: 0 units CBG 121 - 150: 1 unit CBG 151 - 200: 2 units CBG 201 - 250: 3 units CBG 251 - 300: 5 units CBG 301 - 350: 7 units CBG 351 - 400: 9 units CBG > 400: call MD 12/13/23   Raenelle Donalda HERO, MD  insulin  aspart (NOVOLOG ) 100 UNIT/ML injection Inject 4 Units into the skin 3 (three) times daily with meals. 12/13/23   Ghimire, Donalda HERO, MD  insulin  glargine-yfgn (SEMGLEE ) 100 UNIT/ML injection Inject 0.3 mLs (30 Units total) into the skin daily. 12/14/23   Ghimire, Donalda HERO, MD  Insulin  Pen Needle (PEN NEEDLES) 31G X 5 MM MISC 1 each by Does not apply route daily. 10/02/23   Crain, Whitney L, PA  Lancet Device MISC 1 each by Does not apply route 3 (three) times daily. May dispense any manufacturer covered by patient's insurance. 12/21/22   Tanda Bleacher, MD  lisinopril  (ZESTRIL ) 20 MG tablet Take 20 mg by mouth daily. 10/18/23   [provider]  LORazepam  (ATIVAN ) 2 MG/ML injection Inject 1 mL (2 mg total) into the vein every 6 (six) hours as needed (agitation). 12/13/23   Ghimire, Donalda HERO, MD  metFORMIN  (GLUCOPHAGE ) 1000 MG tablet Take 1,000 mg by mouth 2 (two) times daily. 10/18/23   [provider]  PARoxetine  (PAXIL ) 20 MG tablet Take 1 tablet (  20 mg total) by mouth daily. 12/21/22   Tanda Bleacher, MD  thiamine  (VITAMIN B-1) 100 MG tablet Take 1 tablet (100 mg total) by mouth daily. 12/14/23   Ghimire, Donalda HERO, MD  Vitamin D , Ergocalciferol , (DRISDOL ) 1.25 MG (50000 UNIT) CAPS capsule Take 1 capsule (50,000 Units total) by mouth every 7 (seven) days. 12/13/23   Ghimire, Donalda HERO, MD  ziprasidone  (GEODON ) 20 MG injection Inject 20 mg into the muscle every 12 (twelve) hours as needed for agitation (Severe agitation, psychoses). 12/13/23   Ghimire, Donalda HERO, MD    Allergies: Patient has no known allergies.    Review of Systems  Psychiatric/Behavioral:  Positive for confusion.   All other systems  reviewed and are negative.   Updated Vital Signs BP (!) 165/79 (BP Location: Right Arm)   Pulse (!) 103   Temp (!) 97.5 F (36.4 C) (Oral)   Resp 20   Ht 5' 11 (1.803 m)   Wt 115.7 kg   LMP  (LMP Unknown)   SpO2 100%   BMI 35.57 kg/m   Physical Exam Vitals and nursing note reviewed.  Constitutional:      Comments: Confused and agitated  HENT:     Head: Normocephalic.     Comments: Patient is covered with grass    Mouth/Throat:     Mouth: Mucous membranes are dry.  Eyes:     Extraocular Movements: Extraocular movements intact.     Pupils: Pupils are equal, round, and reactive to light.  Cardiovascular:     Rate and Rhythm: Normal rate and regular rhythm.     Pulses: Normal pulses.     Heart sounds: Normal heart sounds.  Pulmonary:     Effort: Pulmonary effort is normal.     Breath sounds: Normal breath sounds.  Abdominal:     General: Abdomen is flat.     Palpations: Abdomen is soft.  Musculoskeletal:        General: Normal range of motion.     Cervical back: Normal range of motion and neck supple.  Skin:    General: Skin is warm.  Neurological:     Comments: Confused but moving all extremities  Psychiatric:     Comments: And has tangential speech and flight of ideas     (all labs ordered are listed, but only abnormal results are displayed) Labs Reviewed  CBG MONITORING, ED - Abnormal; Notable for the following components:      Result Value   Glucose-Capillary >600 (*)    All other components within normal limits  HCG, SERUM, QUALITATIVE  BASIC METABOLIC PANEL WITH GFR  BASIC METABOLIC PANEL WITH GFR  BASIC METABOLIC PANEL WITH GFR  BASIC METABOLIC PANEL WITH GFR  BETA-HYDROXYBUTYRIC ACID  BETA-HYDROXYBUTYRIC ACID  BETA-HYDROXYBUTYRIC ACID  BETA-HYDROXYBUTYRIC ACID  CBC WITH DIFFERENTIAL/PLATELET  URINALYSIS, ROUTINE W REFLEX MICROSCOPIC  BLOOD GAS, VENOUS  I-STAT CHEM 8, ED    EKG: EKG Interpretation Date/Time:  Sunday January 13 2024 18:22:26  EDT Ventricular Rate:  101 PR Interval:  144 QRS Duration:  88 QT Interval:  353 QTC Calculation: 465 R Axis:   -16  Text Interpretation: Sinus tachycardia Abnormal R-wave progression, late transition LVH by voltage ST elev, probable normal early repol pattern No significant change since last tracing Confirmed by Patt Alm DEL ([id number]) on 01/13/2024 6:23:48 PM  Radiology: [facility] Chest Port 1 View Result Date: 01/13/2024 CLINICAL DATA:  Altered mental status EXAM: PORTABLE CHEST 1 VIEW COMPARISON:  Chest x-ray 12/11/2023 FINDINGS: The  heart size and mediastinal contours are within normal limits. Both lungs are clear. The visualized skeletal structures are unremarkable. IMPRESSION: No active disease. Electronically Signed   By: Greig Pique M.D.   On: 01/13/2024 18:48     Procedures   CRITICAL CARE Performed by: Alm VEAR Cave   Total critical care time: 39 minutes  Critical care time was exclusive of separately billable procedures and treating other patients.  Critical care was necessary to treat or prevent imminent or life-threatening deterioration.  Critical care was time spent personally by me on the following activities: development of treatment plan with patient and/or surrogate as well as nursing, discussions with consultants, evaluation of patient's response to treatment, examination of patient, obtaining history from patient or surrogate, ordering and performing treatments and interventions, ordering and review of laboratory studies, ordering and review of radiographic studies, pulse oximetry and re-evaluation of patient's condition.   Medications Ordered in the ED  lactated ringers  bolus 2,314 mL (2,314 mLs Intravenous New Bag/Given 01/13/24 1905)                                    Medical Decision Making Katera Rybka is a 48 y.o. female who presents with altered mental status.  I am concerned that patient may have DKA and also psychosis.  Patient was recently admitted for DKA and  then went to psychiatric facility for psychosis.  Plan to get labs to rule out DKA with CBC and CMP and Chem-8 and beta hydroxy butyric acid and VBG.  Will also get CT head to rule out intracranial bleeding.  Patient will likely need to be started on insulin  drip  9:36 PM Patient is a difficult IV stick.  Nurse was able to get IV and patient has acute renal failure.  Patient's glucose is 600 with normal anion gap.  Patient's beta hydroxybutyrate acid is slightly elevated.  Patient was given 30 cc/kg bolus.  Patient is started on IV insulin  with the non-DKA protocol.  CT head is unremarkable.  At this point patient will need to be admitted for AKI and hyperglycemia.  Once she is stabilized, patient will likely need psychiatry consult  Problems Addressed: AKI (acute kidney injury) [facility]): acute illness or injury Hyperglycemia: acute illness or injury Schizoaffective disorder, unspecified type (HCC): chronic illness or injury with exacerbation, progression, or side effects of treatment  Amount and/or Complexity of Data Reviewed Labs: ordered. Decision-making details documented in ED Course. Radiology: ordered and independent interpretation performed. Decision-making details documented in ED Course.  Risk Prescription drug management. Decision regarding hospitalization.     Final diagnoses:  None    ED Discharge Orders     None          Cave Alm Macho, MD 01/13/24 2137

## 2024-01-13 NOTE — ED Notes (Signed)
 Attempted to get urine sample. Pt placed toilet paper in sample so unable to use.

## 2024-01-14 DIAGNOSIS — E1100 Type 2 diabetes mellitus with hyperosmolarity without nonketotic hyperglycemic-hyperosmolar coma (NKHHC): Secondary | ICD-10-CM | POA: Diagnosis not present

## 2024-01-14 LAB — GLUCOSE, CAPILLARY
Glucose-Capillary: 149 mg/dL — ABNORMAL HIGH (ref 70–99)
Glucose-Capillary: 153 mg/dL — ABNORMAL HIGH (ref 70–99)
Glucose-Capillary: 158 mg/dL — ABNORMAL HIGH (ref 70–99)
Glucose-Capillary: 168 mg/dL — ABNORMAL HIGH (ref 70–99)
Glucose-Capillary: 174 mg/dL — ABNORMAL HIGH (ref 70–99)
Glucose-Capillary: 181 mg/dL — ABNORMAL HIGH (ref 70–99)
Glucose-Capillary: 194 mg/dL — ABNORMAL HIGH (ref 70–99)
Glucose-Capillary: 201 mg/dL — ABNORMAL HIGH (ref 70–99)
Glucose-Capillary: 201 mg/dL — ABNORMAL HIGH (ref 70–99)
Glucose-Capillary: 213 mg/dL — ABNORMAL HIGH (ref 70–99)
Glucose-Capillary: 237 mg/dL — ABNORMAL HIGH (ref 70–99)
Glucose-Capillary: 257 mg/dL — ABNORMAL HIGH (ref 70–99)
Glucose-Capillary: 292 mg/dL — ABNORMAL HIGH (ref 70–99)
Glucose-Capillary: 299 mg/dL — ABNORMAL HIGH (ref 70–99)

## 2024-01-14 LAB — BASIC METABOLIC PANEL WITH GFR
Anion gap: 12 (ref 5–15)
Anion gap: 15 (ref 5–15)
Anion gap: 9 (ref 5–15)
BUN: 40 mg/dL — ABNORMAL HIGH (ref 6–20)
BUN: 40 mg/dL — ABNORMAL HIGH (ref 6–20)
BUN: 45 mg/dL — ABNORMAL HIGH (ref 6–20)
CO2: 21 mmol/L — ABNORMAL LOW (ref 22–32)
CO2: 22 mmol/L (ref 22–32)
CO2: 25 mmol/L (ref 22–32)
Calcium: 10.3 mg/dL (ref 8.9–10.3)
Calcium: 9.5 mg/dL (ref 8.9–10.3)
Calcium: 9.8 mg/dL (ref 8.9–10.3)
Chloride: 103 mmol/L (ref 98–111)
Chloride: 104 mmol/L (ref 98–111)
Chloride: 105 mmol/L (ref 98–111)
Creatinine, Ser: 0.98 mg/dL (ref 0.44–1.00)
Creatinine, Ser: 1 mg/dL (ref 0.44–1.00)
Creatinine, Ser: 1.65 mg/dL — ABNORMAL HIGH (ref 0.44–1.00)
GFR, Estimated: 39 mL/min — ABNORMAL LOW (ref >60)
GFR, Estimated: >60 mL/min (ref >60)
GFR, Estimated: >60 mL/min (ref >60)
Glucose, Bld: 140 mg/dL — ABNORMAL HIGH (ref 70–99)
Glucose, Bld: 155 mg/dL — ABNORMAL HIGH (ref 70–99)
Glucose, Bld: 260 mg/dL — ABNORMAL HIGH (ref 70–99)
Potassium: 3 mmol/L — ABNORMAL LOW (ref 3.5–5.1)
Potassium: 3.4 mmol/L — ABNORMAL LOW (ref 3.5–5.1)
Potassium: 3.6 mmol/L (ref 3.5–5.1)
Sodium: 138 mmol/L (ref 135–145)
Sodium: 139 mmol/L (ref 135–145)
Sodium: 139 mmol/L (ref 135–145)

## 2024-01-14 LAB — BETA-HYDROXYBUTYRIC ACID: Beta-Hydroxybutyric Acid: 0.09 mmol/L (ref 0.05–0.27)

## 2024-01-14 LAB — RAPID URINE DRUG SCREEN, HOSP PERFORMED
Amphetamines: NOT DETECTED
Barbiturates: NOT DETECTED
Benzodiazepines: NOT DETECTED
Cocaine: NOT DETECTED
Opiates: NOT DETECTED
Tetrahydrocannabinol: POSITIVE — AB

## 2024-01-14 LAB — OSMOLALITY: Osmolality: 313 mosm/kg — ABNORMAL HIGH (ref 275–295)

## 2024-01-14 LAB — MAGNESIUM: Magnesium: 2.3 mg/dL (ref 1.7–2.4)

## 2024-01-14 MED ORDER — INSULIN ASPART 100 UNIT/ML IJ SOLN
0.0000 [IU] | Freq: Every day | INTRAMUSCULAR | Status: DC
  Administered 2024-01-14: 3 [IU] via SUBCUTANEOUS

## 2024-01-14 MED ORDER — LISINOPRIL 20 MG PO TABS
20.0000 mg | ORAL_TABLET | Freq: Every day | ORAL | Status: DC
  Administered 2024-01-14 – 2024-01-15 (×2): 20 mg via ORAL
  Filled 2024-01-14: qty 2
  Filled 2024-01-14: qty 1

## 2024-01-14 MED ORDER — ACETAMINOPHEN 325 MG PO TABS: 650.0000 mg | ORAL_TABLET | Freq: Four times a day (QID) | ORAL | Status: DC | PRN

## 2024-01-14 MED ORDER — INSULIN ASPART 100 UNIT/ML IJ SOLN
0.0000 [IU] | Freq: Three times a day (TID) | INTRAMUSCULAR | Status: DC
  Administered 2024-01-14: 11 [IU] via SUBCUTANEOUS
  Administered 2024-01-14: 7 [IU] via SUBCUTANEOUS
  Administered 2024-01-15: 15 [IU] via SUBCUTANEOUS

## 2024-01-14 MED ORDER — POTASSIUM CHLORIDE 10 MEQ/100ML IV SOLN
10.0000 meq | INTRAVENOUS | Status: AC
  Administered 2024-01-14 (×6): 10 meq via INTRAVENOUS
  Filled 2024-01-14 (×6): qty 100

## 2024-01-14 MED ORDER — INSULIN GLARGINE-YFGN 100 UNIT/ML ~~LOC~~ SOLN
15.0000 [IU] | SUBCUTANEOUS | Status: AC
  Administered 2024-01-14: 15 [IU] via SUBCUTANEOUS
  Filled 2024-01-14: qty 0.15

## 2024-01-14 MED ORDER — INSULIN ASPART 100 UNIT/ML IJ SOLN: 0.0000 [IU] | Freq: Three times a day (TID) | INTRAMUSCULAR | Status: DC

## 2024-01-14 MED ORDER — PAROXETINE HCL 20 MG PO TABS
20.0000 mg | ORAL_TABLET | Freq: Every day | ORAL | Status: DC
  Administered 2024-01-14 – 2024-01-15 (×2): 20 mg via ORAL
  Filled 2024-01-14 (×2): qty 1

## 2024-01-14 NOTE — Progress Notes (Addendum)
 PROGRESS NOTE    [name]  FMW:[id number] DOB: [birth year] DOA: 01/13/2024 PCP: No primary care provider on file.  Brief Narrative: 48 y.o. female with medical history significant for bipolar disorder, hypertension, and insulin -dependent diabetes mellitus who called EMS today stating that she needed her vitals checked, was found to be agitated and confused with CBG >600 and was brought into the ED.   Patient is confused about what transpired today.  She states that she was on her way to the swimming pool, and then the story becomes very confusing and inconsistent.  She denies any pain, shortness of breath, fever, or chills.  Per EMS report, the patient had called them for a vital sign check.  Her sugar was >600 in the field and she was brought into the ED.    ED Course: Upon arrival to the ED, patient is found to be afebrile and saturating well on room air with mild tachycardia and stable blood pressure.  Labs are most notable for glucose 609, creatinine 1.93, normal WBC, and beta-hydroxybutyrate 1.05.  There are no acute findings on chest x-ray or head CT.   Patient was given 2.3 L of LR, Ativan , and was started on insulin  infusion.    Assessment & Plan:   Principal Problem:   Hyperosmolar hyperglycemic state (HHS) (HCC) Active Problems:   Bipolar disorder, curr episode mixed, severe, with psychotic features (HCC)   Essential hypertension   AKI (acute kidney injury) (HCC)   1. Hyperglycemia, ?HHS; insulin -dependent type II DM  - A1c was 10.9% in June 2025; serum glucose is 609 in ED with serum bicarb 18, normal AG, and mildly elevated beta hydroxybutyrate - this am no gap  - Continue IVF hydration and IV insulin  infusion with frequent CBGs and serial chem panels     2. Acute encephalopathy; bipolar disorder  - No acute findings on head CT  - Correct metabolic derangements, use delirium precautions and safety sitter, continue scheduled Haldol , use as-needed Haldol  and hydroxyzine       3. AKI  - SCr was 1.93, up from 0.81 last month  -Cr 1.0 from 1.93 - Continue IVF hydration   4. Hypertension  - Restart lisinopril     5 hypokalemia replete recheck labs mag level pending  Estimated body mass index is 35.57 kg/m as calculated from the following:   Height as of this encounter: 5' 11 (1.803 m).   Weight as of this encounter: 115.7 kg.  DVT prophylaxis: lovenox  Code Status:full Family Communication: none Disposition Plan:  Status is: Inpatient   Consultants:  none Procedures:none Antimicrobials: none Subjective: Sleeping sitter at bedside  Objective: Vitals:   01/13/24 1930 01/13/24 2000 01/13/24 2300 01/14/24 0745  BP: (!) 148/75 (!) 150/80    Pulse: (!) 103 (!) 104    Resp: 14 11    Temp:   98.6 F (37 C) 97.7 F (36.5 C)  TempSrc:   Axillary Oral  SpO2: 96% 99%    Weight:      Height:        Intake/Output Summary (Last 24 hours) at 01/14/2024 0844 Last data filed at 01/14/2024 0155 Gross per 24 hour  Intake 1143.09 ml  Output --  Net 1143.09 ml   Filed Weights   01/13/24 1824  Weight: 115.7 kg    Examination:  General exam: Appears in nad  Respiratory system: Clear to auscultation. Respiratory effort normal. Cardiovascular system: reg Gastrointestinal system: Abdomen is nondistended, soft and nontender. No organomegaly or masses felt. Normal bowel sounds  heard. Central nervous system: asleep Extremities: no edema  Data Reviewed: I have personally reviewed following labs and imaging studies  CBC: Recent Labs  Lab 01/13/24 2039  WBC 10.0  NEUTROABS 8.7*  HGB 11.5*  HCT 35.9*  MCV 80.7  PLT 309   Basic Metabolic Panel: Recent Labs  Lab 01/13/24 2039 01/13/24 2140 01/14/24 0005 01/14/24 0616  NA 133* 134* 139 138  K 4.3 3.2* 3.6 3.0*  CL 99 99 103 104  CO2 21* 21* 21* 25  GLUCOSE 609* 523* 260* 140*  BUN 48* 45* 45* 40*  CREATININE 1.93* 1.60* 1.65* 1.00  CALCIUM  10.1 9.7 10.3 9.5   GFR: Estimated Creatinine  Clearance: 98.5 mL/min (by C-G formula based on SCr of 1 mg/dL). Liver Function Tests: No results for input(s): AST, ALT, ALKPHOS, BILITOT, PROT, ALBUMIN in the last 168 hours. No results for input(s): LIPASE, AMYLASE in the last 168 hours. No results for input(s): AMMONIA in the last 168 hours. Coagulation Profile: No results for input(s): INR, PROTIME in the last 168 hours. Cardiac Enzymes: No results for input(s): CKTOTAL, CKMB, CKMBINDEX, TROPONINI in the last 168 hours. BNP (last 3 results) No results for input(s): PROBNP in the last 8760 hours. HbA1C: No results for input(s): HGBA1C in the last 72 hours. CBG: Recent Labs  Lab 01/14/24 0403 01/14/24 0507 01/14/24 0603 01/14/24 0707 01/14/24 0804  GLUCAP 201* 213* 158* 153* 174*   Lipid Profile: No results for input(s): CHOL, HDL, LDLCALC, TRIG, CHOLHDL, LDLDIRECT in the last 72 hours. Thyroid  Function Tests: No results for input(s): TSH, T4TOTAL, FREET4, T3FREE, THYROIDAB in the last 72 hours. Anemia Panel: No results for input(s): VITAMINB12, FOLATE, FERRITIN, TIBC, IRON, RETICCTPCT in the last 72 hours. Sepsis Labs: No results for input(s): PROCALCITON, LATICACIDVEN in the last 168 hours.  No results found for this or any previous visit (from the past 240 hours).       Radiology Studies: CT HEAD WO CONTRAST ( ) Result Date: 01/13/2024 CLINICAL DATA:  Mental status change, unknown cause EXAM: CT HEAD WITHOUT CONTRAST TECHNIQUE: Contiguous axial images were obtained from the base of the skull through the vertex without intravenous contrast. RADIATION DOSE REDUCTION: This exam was performed according to the departmental dose-optimization program which includes automated exposure control, adjustment of the mA and/or kV according to patient size and/or use of iterative reconstruction technique. COMPARISON:  CT head 01/10/2024. FINDINGS: Brain: No  evidence of acute infarction, hemorrhage, hydrocephalus, extra-axial collection or mass lesion/mass effect. Vascular: No hyperdense vessel. Skull: No acute fracture. Sinuses/Orbits: Clear sinuses.  No acute orbital findings. Other: No mastoid effusions. IMPRESSION: No evidence of acute intracranial abnormality. Electronically Signed   By: Gilmore GORMAN Molt M.D.   On: 01/13/2024 21:16   DG Chest Port 1 View Result Date: 01/13/2024 CLINICAL DATA:  Altered mental status EXAM: PORTABLE CHEST 1 VIEW COMPARISON:  Chest x-ray 12/11/2023 FINDINGS: The heart size and mediastinal contours are within normal limits. Both lungs are clear. The visualized skeletal structures are unremarkable. IMPRESSION: No active disease. Electronically Signed   By: Greig Pique M.D.   On: 01/13/2024 18:48    Scheduled Meds:  Chlorhexidine  Gluconate Cloth  6 each Topical Daily   enoxaparin  (LOVENOX ) injection  40 mg Subcutaneous Q24H   haloperidol   5 mg Oral BID   Continuous Infusions:  dextrose  5% lactated ringers  125 mL/hr at 01/14/24 0822   insulin  3 Units/hr (01/14/24 0155)   lactated ringers  Stopped (01/14/24 0049)   potassium chloride  10 mEq (01/14/24 0825)  LOS: 1 day    Time spent: 50 min  Almarie KANDICE Hoots, MD  01/14/2024, 8:44 AM

## 2024-01-14 NOTE — Inpatient Diabetes Management (Signed)
 Inpatient Diabetes Program Recommendations  AACE/ADA: New Consensus Statement on Inpatient Glycemic Control (2015)  Target Ranges:  Prepandial:   less than 140 mg/dL      Peak postprandial:   less than 180 mg/dL (1-2 hours)      Critically ill patients:  140 - 180 mg/dL   Lab Results  Component Value Date   GLUCAP 174 (H) 01/14/2024   HGBA1C 10.9 (H) 12/11/2023    Review of Glycemic Control  Latest Reference Range & Units 01/14/24 05:07 01/14/24 06:03 01/14/24 07:07 01/14/24 08:04  Glucose-Capillary 70 - 99 mg/dL 786 (H) 841 (H) 846 (H) 174 (H)  (H): Data is abnormally high  Latest Reference Range & Units 01/14/24 06:16  CO2 22 - 32 mmol/L 25   Diabetes history: DM2  Outpatient Diabetes medications:  Semglee  30 units every day Novolog  0-9 units TID and 4 units [facility] Metformin  1000 mg BID  Current orders for Inpatient glycemic control:  IV insulin   Inpatient Diabetes Program Recommendations:    When MD is ready to transition to SQ insulin , please consider:  Semglee  36 units 2 hours prior to DC of IV insulin  then every day Novolog  0-9 units Q4H Once eating Novolog  0-9 units TID and HS + 4 units [facility] Carb modified diet  Received DM consult for DKA/HHS/Hyperglycemia.  The DM team spoke with her during last admission on 12/13/23 regarding DM management.    Thank you, Wyvonna Pinal, MSN, [facility] Diabetes Coordinator Inpatient Diabetes Program [phone] (team pager from 8a-5p)

## 2024-01-14 NOTE — Plan of Care (Signed)
 Problem: Coping: Goal: Ability to adjust to condition or change in health will improve 01/14/2024 1000 by Mac Chess, RN Outcome: Progressing 01/14/2024 1000 by Mac Chess, RN Outcome: Not Progressing   Problem: Metabolic: Goal: Ability to maintain appropriate glucose levels will improve 01/14/2024 1000 by Mac Chess, RN Outcome: Progressing 01/14/2024 1000 by Mac Chess, RN Outcome: Not Progressing   Problem: Nutritional: Goal: Progress toward achieving an optimal weight will improve 01/14/2024 1000 by Mac Chess, RN Outcome: Progressing 01/14/2024 1000 by Mac Chess, RN Outcome: Not Progressing   Problem: Skin Integrity: Goal: Risk for impaired skin integrity will decrease 01/14/2024 1000 by Mac Chess, RN Outcome: Progressing 01/14/2024 1000 by Mac Chess, RN Outcome: Not Progressing   Problem: Tissue Perfusion: Goal: Adequacy of tissue perfusion will improve 01/14/2024 1000 by Mac Chess, RN Outcome: Progressing 01/14/2024 1000 by Mac Chess, RN Outcome: Not Progressing   Problem: Education: Goal: Ability to describe self-care measures that may prevent or decrease complications (Diabetes Survival Skills Education) will improve 01/14/2024 1000 by Mac Chess, RN Outcome: Not Progressing 01/14/2024 1000 by Mac Chess, RN Outcome: Not Progressing Goal: Individualized Educational Video(s) 01/14/2024 1000 by Mac Chess, RN Outcome: Not Progressing 01/14/2024 1000 by Mac Chess, RN Outcome: Not Progressing   Problem: Fluid Volume: Goal: Ability to maintain a balanced intake and output will improve 01/14/2024 1000 by Mac Chess, RN Outcome: Not Progressing 01/14/2024 1000 by Mac Chess, RN Outcome: Not Progressing   Problem: Health Behavior/Discharge Planning: Goal: Ability to identify and utilize available resources and services will improve 01/14/2024 1000 by Mac Chess, RN Outcome: Not Progressing 01/14/2024  1000 by Mac Chess, RN Outcome: Not Progressing Goal: Ability to manage health-related needs will improve 01/14/2024 1000 by Mac Chess, RN Outcome: Not Progressing 01/14/2024 1000 by Mac Chess, RN Outcome: Not Progressing   Problem: Nutritional: Goal: Maintenance of adequate nutrition will improve 01/14/2024 1000 by Mac Chess, RN Outcome: Not Progressing 01/14/2024 1000 by Mac Chess, RN Outcome: Not Progressing   Problem: Education: Goal: Ability to describe self-care measures that may prevent or decrease complications (Diabetes Survival Skills Education) will improve Outcome: Not Progressing Goal: Individualized Educational Video(s) Outcome: Not Progressing   Problem: Cardiac: Goal: Ability to maintain an adequate cardiac output will improve Outcome: Not Progressing   Problem: Health Behavior/Discharge Planning: Goal: Ability to identify and utilize available resources and services will improve Outcome: Not Progressing Goal: Ability to manage health-related needs will improve Outcome: Not Progressing   Problem: Fluid Volume: Goal: Ability to achieve a balanced intake and output will improve Outcome: Not Progressing   Problem: Metabolic: Goal: Ability to maintain appropriate glucose levels will improve Outcome: Not Progressing   Problem: Nutritional: Goal: Maintenance of adequate nutrition will improve Outcome: Not Progressing Goal: Maintenance of adequate weight for body size and type will improve Outcome: Not Progressing   Problem: Respiratory: Goal: Will regain and/or maintain adequate ventilation Outcome: Not Progressing   Problem: Urinary Elimination: Goal: Ability to achieve and maintain adequate renal perfusion and functioning will improve Outcome: Not Progressing   Problem: Education: Goal: Knowledge of General Education information will improve Description: Including pain rating scale, medication(s)/side effects and non-pharmacologic  comfort measures Outcome: Not Progressing   Problem: Health Behavior/Discharge Planning: Goal: Ability to manage health-related needs will improve Outcome: Not Progressing   Problem: Clinical Measurements: Goal: Ability to maintain clinical measurements within normal limits will improve Outcome: Not Progressing Goal: Will remain free from infection Outcome: Not Progressing Goal: Diagnostic test results will improve Outcome: Not Progressing Goal:  Respiratory complications will improve Outcome: Not Progressing Goal: Cardiovascular complication will be avoided Outcome: Not Progressing   Problem: Activity: Goal: Risk for activity intolerance will decrease Outcome: Not Progressing   Problem: Nutrition: Goal: Adequate nutrition will be maintained Outcome: Not Progressing   Problem: Coping: Goal: Level of anxiety will decrease Outcome: Not Progressing   Problem: Elimination: Goal: Will not experience complications related to bowel motility Outcome: Not Progressing Goal: Will not experience complications related to urinary retention Outcome: Not Progressing   Problem: Pain Managment: Goal: General experience of comfort will improve and/or be controlled Outcome: Not Progressing   Problem: Safety: Goal: Ability to remain free from injury will improve Outcome: Not Progressing   Problem: Skin Integrity: Goal: Risk for impaired skin integrity will decrease Outcome: Not Progressing

## 2024-01-15 ENCOUNTER — Emergency Department (HOSPITAL_COMMUNITY)

## 2024-01-15 ENCOUNTER — Other Ambulatory Visit: Payer: Self-pay

## 2024-01-15 ENCOUNTER — Emergency Department (HOSPITAL_COMMUNITY): Admission: EM | Admit: 2024-01-15 | Discharge: 2024-01-15 | Disposition: A | Discharge status: Home / Self Care

## 2024-01-15 ENCOUNTER — Emergency Department (HOSPITAL_COMMUNITY)
Admission: EM | Admit: 2024-01-15 | Discharge: 2024-01-16 | Discharge status: Against Medical Advice | Attending: Emergency Medicine | Admitting: Emergency Medicine

## 2024-01-15 ENCOUNTER — Encounter (HOSPITAL_COMMUNITY): Payer: Self-pay | Admitting: Emergency Medicine

## 2024-01-15 DIAGNOSIS — I10 Essential (primary) hypertension: Secondary | ICD-10-CM | POA: Insufficient documentation

## 2024-01-15 DIAGNOSIS — R55 Syncope and collapse: Secondary | ICD-10-CM | POA: Insufficient documentation

## 2024-01-15 DIAGNOSIS — E1100 Type 2 diabetes mellitus with hyperosmolarity without nonketotic hyperglycemic-hyperosmolar coma (NKHHC): Secondary | ICD-10-CM | POA: Diagnosis not present

## 2024-01-15 DIAGNOSIS — E1165 Type 2 diabetes mellitus with hyperglycemia: Secondary | ICD-10-CM | POA: Insufficient documentation

## 2024-01-15 DIAGNOSIS — Z794 Long term (current) use of insulin: Secondary | ICD-10-CM | POA: Insufficient documentation

## 2024-01-15 DIAGNOSIS — Z79899 Other long term (current) drug therapy: Secondary | ICD-10-CM | POA: Diagnosis not present

## 2024-01-15 DIAGNOSIS — E876 Hypokalemia: Secondary | ICD-10-CM | POA: Insufficient documentation

## 2024-01-15 DIAGNOSIS — T679XXA Effect of heat and light, unspecified, initial encounter: Secondary | ICD-10-CM

## 2024-01-15 DIAGNOSIS — R739 Hyperglycemia, unspecified: Secondary | ICD-10-CM | POA: Diagnosis present

## 2024-01-15 DIAGNOSIS — Z5321 Procedure and treatment not carried out due to patient leaving prior to being seen by health care provider: Secondary | ICD-10-CM | POA: Insufficient documentation

## 2024-01-15 DIAGNOSIS — Z609 Problem related to social environment, unspecified: Secondary | ICD-10-CM | POA: Insufficient documentation

## 2024-01-15 LAB — BASIC METABOLIC PANEL WITH GFR
Anion gap: 11 (ref 5–15)
Anion gap: 11 (ref 5–15)
BUN: 27 mg/dL — ABNORMAL HIGH (ref 6–20)
BUN: 21 mg/dL — ABNORMAL HIGH (ref 6–20)
CO2: 21 mmol/L — ABNORMAL LOW (ref 22–32)
CO2: 19 mmol/L — ABNORMAL LOW (ref 22–32)
Calcium: 9.2 mg/dL (ref 8.9–10.3)
Calcium: 9.3 mg/dL (ref 8.9–10.3)
Chloride: 102 mmol/L (ref 98–111)
Chloride: 105 mmol/L (ref 98–111)
Creatinine, Ser: 0.84 mg/dL (ref 0.44–1.00)
Creatinine, Ser: 0.88 mg/dL (ref 0.44–1.00)
GFR, Estimated: >60 mL/min (ref >60)
GFR, Estimated: >60 mL/min (ref >60)
Glucose, Bld: 351 mg/dL — ABNORMAL HIGH (ref 70–99)
Glucose, Bld: 338 mg/dL — ABNORMAL HIGH (ref 70–99)
Potassium: 3.8 mmol/L (ref 3.5–5.1)
Potassium: 3.7 mmol/L (ref 3.5–5.1)
Sodium: 134 mmol/L — ABNORMAL LOW (ref 135–145)
Sodium: 135 mmol/L (ref 135–145)

## 2024-01-15 LAB — RAPID URINE DRUG SCREEN, HOSP PERFORMED
Amphetamines: NOT DETECTED
Barbiturates: NOT DETECTED
Benzodiazepines: NOT DETECTED
Cocaine: NOT DETECTED
Opiates: NOT DETECTED
Tetrahydrocannabinol: POSITIVE — AB

## 2024-01-15 LAB — CK: Total CK: 150 U/L (ref 38–234)

## 2024-01-15 LAB — CBC
HCT: 33.9 % — ABNORMAL LOW (ref 36.0–46.0)
Hemoglobin: 10.8 g/dL — ABNORMAL LOW (ref 12.0–15.0)
MCH: 26.7 pg (ref 26.0–34.0)
MCHC: 31.9 g/dL (ref 30.0–36.0)
MCV: 83.9 fL (ref 80.0–100.0)
Platelets: 300 K/uL (ref 150–400)
RBC: 4.04 MIL/uL (ref 3.87–5.11)
RDW: 14.2 % (ref 11.5–15.5)
WBC: 8.9 K/uL (ref 4.0–10.5)
nRBC: 0 % (ref 0.0–0.2)

## 2024-01-15 LAB — ETHANOL: Alcohol, Ethyl (B): <15 mg/dL (ref <15)

## 2024-01-15 LAB — SALICYLATE LEVEL: Salicylate Lvl: <7 mg/dL — ABNORMAL LOW (ref 7.0–30.0)

## 2024-01-15 LAB — ACETAMINOPHEN LEVEL: Acetaminophen (Tylenol), Serum: <10 ug/mL — ABNORMAL LOW (ref 10–30)

## 2024-01-15 LAB — GLUCOSE, CAPILLARY: Glucose-Capillary: 342 mg/dL — ABNORMAL HIGH (ref 70–99)

## 2024-01-15 LAB — CBG MONITORING, ED: Glucose-Capillary: 351 mg/dL — ABNORMAL HIGH (ref 70–99)

## 2024-01-15 MED ORDER — LACTATED RINGERS IV BOLUS
1000.0000 mL | Freq: Once | INTRAVENOUS | Status: AC
  Administered 2024-01-15: 1000 mL via INTRAVENOUS

## 2024-01-15 MED ORDER — ALPRAZOLAM 0.25 MG PO TABS
0.5000 mg | ORAL_TABLET | Freq: Once | ORAL | Status: AC
  Administered 2024-01-15: 0.5 mg via ORAL
  Filled 2024-01-15: qty 2

## 2024-01-15 NOTE — ED Triage Notes (Signed)
 Patient arrives after being discharged tonight. Patient unable to give complaint or information in triage. Patient tearful in triage - refusing to answer questions.

## 2024-01-15 NOTE — ED Notes (Signed)
 Phlebotomy to collect blood work
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
## 2024-01-15 NOTE — Plan of Care (Signed)

## 2024-01-15 NOTE — ED Notes (Signed)
 Patient refused discharge vital signs.

## 2024-01-15 NOTE — Discharge Instructions (Signed)
 Please follow-up with your primary doctor and follow the instructions given to you when you are discharged from inpatient.  Return if develop fevers, chills, headache, chest pain, shortness of breath, palpitations, thoughts of self-harm, hallucinations or any new or worsening symptoms that are concerning to you.

## 2024-01-15 NOTE — Discharge Summary (Signed)
 Physician Discharge Summary  [name] FMW:[id number] DOB: [birth year] DOA: 01/13/2024  PCP: [facility]  Admit date: 01/13/2024 Discharge date: 01/15/2024  Admitted From: home Disposition:  home Recommendations for Outpatient Follow-up:  Follow up with PCP in 1-2 weeks Please obtain BMP/CBC in one week  Home Health:none Equipment/Devices none Discharge Condition:stable CODE STATUS:full Diet recommendation: cardiac  Brief/Interim Summary: 48 y.o. female with medical history significant for bipolar disorder, hypertension, and insulin -dependent diabetes mellitus who called EMS today stating that she needed her vitals checked, was found to be agitated and confused with CBG >600 and was brought into the ED.   Patient is confused about what transpired today.  She states that she was on her way to the swimming pool, and then the story becomes very confusing and inconsistent.  She denies any pain, shortness of breath, fever, or chills.  Per EMS report, the patient had called them for a vital sign check.  Her sugar was >600 in the field and she was brought into the ED.    ED Course: Upon arrival to the ED, patient is found to be afebrile and saturating well on room air with mild tachycardia and stable blood pressure.  Labs are most notable for glucose 609, creatinine 1.93, normal WBC, and beta-hydroxybutyrate 1.05.  There are no acute findings on chest x-ray or head CT.  Patient was given 2.3 L of LR, Ativan , and was started on insulin  infusion.    Discharge Diagnoses:  Principal Problem:   Hyperosmolar hyperglycemic state (HHS) (HCC) Active Problems:   Bipolar disorder, curr episode mixed, severe, with psychotic features (HCC)   Essential hypertension   AKI (acute kidney injury) (HCC)    1. Hyperglycemia, ?HHS; insulin -dependent type II DM  - A1c was 10.9% in June 2025; serum glucose is 609 in ED with serum bicarb 18, normal AG, and mildly elevated beta  hydroxybutyrate.  She was treated with insulin  drip and IV fluids initially and then changed to long-acting insulin .  She was able to tolerate a diet prior to discharge.   2. Acute encephalopathy; bipolar disorder this was resolved this was thought to be related to hyperglycemia. - No acute findings on head CT   -3. AKI resolved with IV fluids.   4. Hypertension  - on lisinopril      5 hypokalemia repleted   Estimated body mass index is 35.57 kg/m as calculated from the following:   Height as of this encounter: 5' 11 (1.803 m).   Weight as of this encounter: 115.7 kg.  Discharge Instructions  Discharge Instructions     Diet - low sodium heart healthy   Complete by: As directed    Increase activity slowly   Complete by: As directed       Allergies as of 01/15/2024   No Known Allergies      Medication List     STOP taking these medications    haloperidol  lactate 5 MG/ML injection Commonly known as: HALDOL    hydrOXYzine  25 MG tablet Commonly known as: ATARAX    Ibuprofen  200 MG Caps   insulin  aspart 100 UNIT/ML injection Commonly known as: novoLOG    insulin  glargine-yfgn 100 UNIT/ML injection Commonly known as: SEMGLEE    LORazepam  2 MG/ML injection Commonly known as: ATIVAN    NovoLOG  FlexPen 100 UNIT/ML FlexPen Generic drug: insulin  aspart   Vitamin D  (Ergocalciferol ) 1.25 MG (50000 UNIT) Caps capsule Commonly known as: DRISDOL    ziprasidone  20 MG injection Commonly known as: GEODON   TAKE these medications    benztropine  1 MG tablet Commonly known as: COGENTIN  Take 1 tablet (1 mg total) by mouth 2 (two) times daily. What changed: when to take this   Claritin 10 MG tablet Generic drug: loratadine Take 10 mg by mouth daily as needed for allergies, rhinitis or itching.   ferrous sulfate  325 (65 FE) MG tablet Take 1 tablet (325 mg total) by mouth daily with breakfast.   haloperidol  5 MG tablet Commonly known as: HALDOL  Take 1 tablet (5 mg  total) by mouth 2 (two) times daily. What changed:  how much to take when to take this   haloperidol  decanoate 100 MG/ML injection Commonly known as: HALDOL  DECANOATE Inject 100 mg into the muscle every 28 (twenty-eight) days.   Lancet Device Misc 1 each by Does not apply route 3 (three) times daily. May dispense any manufacturer covered by patient's insurance.   lisinopril  20 MG tablet Commonly known as: ZESTRIL  Take 20 mg by mouth at bedtime.   NovoLIN  70/30 ReliOn (70-30) 100 UNIT/ML injection Generic drug: insulin  NPH-regular Human Inject 30 Units into the skin in the morning and at bedtime.   PARoxetine  20 MG tablet Commonly known as: PAXIL  Take 1 tablet (20 mg total) by mouth daily. What changed: when to take this   Pen Needles 31G X 5 MM Misc 1 each by Does not apply route daily.   thiamine  100 MG tablet Commonly known as: Vitamin B-1 Take 1 tablet (100 mg total) by mouth daily.   traZODone  50 MG tablet Commonly known as: DESYREL  Take 50 mg by mouth at bedtime as needed for sleep.   VITAMIN E PO Take 1 capsule by mouth daily.        No Known Allergies  Consultations:none   Procedures/Studies: CT HEAD WO CONTRAST ( ) Result Date: 01/13/2024 CLINICAL DATA:  Mental status change, unknown cause EXAM: CT HEAD WITHOUT CONTRAST TECHNIQUE: Contiguous axial images were obtained from the base of the skull through the vertex without intravenous contrast. RADIATION DOSE REDUCTION: This exam was performed according to the departmental dose-optimization program which includes automated exposure control, adjustment of the mA and/or kV according to patient size and/or use of iterative reconstruction technique. COMPARISON:  CT head 01/10/2024. FINDINGS: Brain: No evidence of acute infarction, hemorrhage, hydrocephalus, extra-axial collection or mass lesion/mass effect. Vascular: No hyperdense vessel. Skull: No acute fracture. Sinuses/Orbits: Clear sinuses.  No acute orbital  findings. Other: No mastoid effusions. IMPRESSION: No evidence of acute intracranial abnormality. Electronically Signed   By: Gilmore GORMAN Molt M.D.   On: 01/13/2024 21:16   DG Chest Port 1 View Result Date: 01/13/2024 CLINICAL DATA:  Altered mental status EXAM: PORTABLE CHEST 1 VIEW COMPARISON:  Chest x-ray 12/11/2023 FINDINGS: The heart size and mediastinal contours are within normal limits. Both lungs are clear. The visualized skeletal structures are unremarkable. IMPRESSION: No active disease. Electronically Signed   By: Greig Pique M.D.   On: 01/13/2024 18:48   (Echo, Carotid, EGD, Colonoscopy, ERCP)    Subjective:  Resting in bed anxious to go home tolerated breakfast Discharge Exam: Vitals:   01/15/24 0027 01/15/24 0501  BP: (!) 161/83 (!) 155/82  Pulse: 99 87  Resp: 18 18  Temp: 98.2 F (36.8 C) 98 F (36.7 C)  SpO2: 98% 100%   Vitals:   01/14/24 1640 01/14/24 2009 01/15/24 0027 01/15/24 0501  BP: 138/83 137/82 (!) 161/83 (!) 155/82  Pulse: 100 94 99 87  Resp: 16 19 18 18   Temp: 98.6  F (37 C) 98.1 F (36.7 C) 98.2 F (36.8 C) 98 F (36.7 C)  TempSrc:  Oral  Oral  SpO2: 100% 98% 98% 100%  Weight:      Height:        General: Pt is alert, awake, not in acute distress Cardiovascular: RRR, S1/S2 +, no rubs, no gallops Respiratory: CTA bilaterally, no wheezing, no rhonchi Abdominal: Soft, NT, ND, bowel sounds + Extremities: no edema, no cyanosis    The results of significant diagnostics from this hospitalization (including imaging, microbiology, ancillary and laboratory) are listed below for reference.     Microbiology: No results found for this or any previous visit (from the past 240 hours).   Labs: BNP (last 3 results) No results for input(s): BNP in the last 8760 hours. Basic Metabolic Panel: Recent Labs  Lab 01/13/24 2140 01/14/24 0005 01/14/24 0616 01/14/24 0935 01/15/24 0948  NA 134* 139 138 139 134*  K 3.2* 3.6 3.0* 3.4* 3.8  CL 99 103  104 105 102  CO2 21* 21* 25 22 21*  GLUCOSE 523* 260* 140* 155* 351*  BUN 45* 45* 40* 40* 27*  CREATININE 1.60* 1.65* 1.00 0.98 0.84  CALCIUM  9.7 10.3 9.5 9.8 9.2  MG  --   --   --  2.3  --    Liver Function Tests: No results for input(s): AST, ALT, ALKPHOS, BILITOT, PROT, ALBUMIN in the last 168 hours. No results for input(s): LIPASE, AMYLASE in the last 168 hours. No results for input(s): AMMONIA in the last 168 hours. CBC: Recent Labs  Lab 01/13/24 2039  WBC 10.0  NEUTROABS 8.7*  HGB 11.5*  HCT 35.9*  MCV 80.7  PLT 309   Cardiac Enzymes: No results for input(s): CKTOTAL, CKMB, CKMBINDEX, TROPONINI in the last 168 hours. BNP: Invalid input(s): POCBNP CBG: Recent Labs  Lab 01/14/24 1126 01/14/24 1528 01/14/24 1711 01/14/24 2048 01/15/24 0748  GLUCAP 237* 257* 299* 292* 342*   D-Dimer No results for input(s): DDIMER in the last 72 hours. Hgb A1c No results for input(s): HGBA1C in the last 72 hours. Lipid Profile No results for input(s): CHOL, HDL, LDLCALC, TRIG, CHOLHDL, LDLDIRECT in the last 72 hours. Thyroid  function studies No results for input(s): TSH, T4TOTAL, T3FREE, THYROIDAB in the last 72 hours.  Invalid input(s): FREET3 Anemia work up No results for input(s): VITAMINB12, FOLATE, FERRITIN, TIBC, IRON, RETICCTPCT in the last 72 hours. Urinalysis    Component Value Date/Time   COLORURINE YELLOW 01/13/2024 2226   APPEARANCEUR HAZY (A) 01/13/2024 2226   LABSPEC 1.024 01/13/2024 2226   PHURINE 5.0 01/13/2024 2226   GLUCOSEU >=500 (A) 01/13/2024 2226   HGBUR LARGE (A) 01/13/2024 2226   BILIRUBINUR NEGATIVE 01/13/2024 2226   BILIRUBINUR negative 04/08/2021 1413   BILIRUBINUR neg 10/11/2015 1440   KETONESUR 5 (A) 01/13/2024 2226   PROTEINUR NEGATIVE 01/13/2024 2226   UROBILINOGEN 0.2 04/08/2021 1413   UROBILINOGEN 0.2 09/15/2012 0000   NITRITE NEGATIVE 01/13/2024 2226   LEUKOCYTESUR  NEGATIVE 01/13/2024 2226   Sepsis Labs Recent Labs  Lab 01/13/24 2039  WBC 10.0   Microbiology No results found for this or any previous visit (from the past 240 hours).   Time coordinating discharge:39 min SIGNED:   Almarie KANDICE Hoots, MD  [facility] 01/15/2024, 3:46 PM

## 2024-01-15 NOTE — ED Provider Notes (Signed)
 [facility] EMERGENCY DEPARTMENT AT [facility] Provider Note   CSN: [id number] Arrival date & time: 01/15/24  1751     Patient presents with: No chief complaint on file.   [name] is a 48 y.o. female.   48 year old female presenting emergency department after bystanders called EMS as she was laying outside on the ground in front of her home.  Discharged from hospital today, poor historian and cannot provide much details as to why she was laying on the ground.  Denies any pain.  History of bipolar and schizophrenia.        Prior to Admission medications   Medication Sig Start Date End Date Taking? Authorizing Provider  benztropine  (COGENTIN ) 1 MG tablet Take 1 tablet (1 mg total) by mouth 2 (two) times daily. Patient taking differently: Take 1 mg by mouth at bedtime. 12/21/22   Tanda Bleacher, MD  CLARITIN 10 MG tablet Take 10 mg by mouth daily as needed for allergies, rhinitis or itching.    [provider]  ferrous sulfate  325 (65 FE) MG tablet Take 1 tablet (325 mg total) by mouth daily with breakfast. 02/16/23   Tanda Bleacher, MD  haloperidol  (HALDOL ) 5 MG tablet Take 1 tablet (5 mg total) by mouth 2 (two) times daily. Patient taking differently: Take 5-10 mg by mouth at bedtime. 12/13/23   Ghimire, Donalda HERO, MD  haloperidol  decanoate (HALDOL  DECANOATE) 100 MG/ML injection Inject 100 mg into the muscle every 28 (twenty-eight) days. 10/19/23   [provider]  insulin  NPH-regular Human (NOVOLIN  70/30 RELION) (70-30) 100 UNIT/ML injection Inject 30 Units into the skin in the morning and at bedtime.    [provider]  Insulin  Pen Needle (PEN NEEDLES) 31G X 5 MM MISC 1 each by Does not apply route daily. 10/02/23   Crain, Whitney L, PA  Lancet Device MISC 1 each by Does not apply route 3 (three) times daily. May dispense any manufacturer covered by patient's insurance. 12/21/22   Tanda Bleacher, MD  lisinopril  (ZESTRIL ) 20 MG tablet Take 20 mg by  mouth at bedtime. 10/18/23   [provider]  PARoxetine  (PAXIL ) 20 MG tablet Take 1 tablet (20 mg total) by mouth daily. Patient taking differently: Take 20 mg by mouth at bedtime. 12/21/22   Tanda Bleacher, MD  thiamine  (VITAMIN B-1) 100 MG tablet Take 1 tablet (100 mg total) by mouth daily. 12/14/23   Ghimire, Donalda HERO, MD  traZODone  (DESYREL ) 50 MG tablet Take 50 mg by mouth at bedtime as needed for sleep.    [provider]  VITAMIN E PO Take 1 capsule by mouth daily.    [provider]    Allergies: Patient has no known allergies.    Review of Systems  Updated Vital Signs BP (!) 154/84   Pulse (!) 115   Temp 98.4 F (36.9 C) (Oral)   Resp 18   Ht 5' 11 (1.803 m)   Wt 115 kg   LMP  (LMP Unknown)   SpO2 100%   BMI 35.36 kg/m   Physical Exam Vitals and nursing note reviewed.  Constitutional:      General: She is not in acute distress.    Appearance: She is not toxic-appearing.  HENT:     Head: Normocephalic and atraumatic.     Nose: Nose normal.     Mouth/Throat:     Mouth: Mucous membranes are moist.  Eyes:     Conjunctiva/sclera: Conjunctivae normal.  Cardiovascular:  Rate and Rhythm: Normal rate and regular rhythm.  Pulmonary:     Effort: Pulmonary effort is normal.     Breath sounds: Normal breath sounds.  Abdominal:     General: Abdomen is flat. There is no distension.     Palpations: Abdomen is soft.     Tenderness: There is no abdominal tenderness. There is no guarding or rebound.  Musculoskeletal:        General: Normal range of motion.  Skin:    General: Skin is warm and dry.     Capillary Refill: Capillary refill takes less than 2 seconds.  Neurological:     Mental Status: She is alert and oriented to person, place, and time.  Psychiatric:        Behavior: Behavior normal.     Comments: Anxious, tearful     (all labs ordered are listed, but only abnormal results are displayed) Labs Reviewed  CBC - Abnormal; Notable  for the following components:      Result Value   Hemoglobin 10.8 (*)    HCT 33.9 (*)    All other components within normal limits  BASIC METABOLIC PANEL WITH GFR - Abnormal; Notable for the following components:   CO2 19 (*)    Glucose, Bld 338 (*)    BUN 21 (*)    All other components within normal limits  ACETAMINOPHEN  LEVEL - Abnormal; Notable for the following components:   Acetaminophen  (Tylenol ), Serum <10 (*)    All other components within normal limits  SALICYLATE LEVEL - Abnormal; Notable for the following components:   Salicylate Lvl <7.0 (*)    All other components within normal limits  RAPID URINE DRUG SCREEN, HOSP PERFORMED - Abnormal; Notable for the following components:   Tetrahydrocannabinol POSITIVE (*)    All other components within normal limits  CBG MONITORING, ED - Abnormal; Notable for the following components:   Glucose-Capillary 351 (*)    All other components within normal limits  CK  ETHANOL    EKG: EKG Interpretation Date/Time:  Tuesday January 15 2024 17:58:02 EDT Ventricular Rate:  115 PR Interval:  142 QRS Duration:  85 QT Interval:  327 QTC Calculation: 453 R Axis:   -20  Text Interpretation: Sinus tachycardia LVH by voltage ST elev, probable normal early repol pattern Confirmed by Neysa Clap [phone]) on 01/15/2024 8:29:53 PM  Radiology: CT Head Wo Contrast Result Date: 01/15/2024 CLINICAL DATA:  Mental status change, unknown cause found face down on concrete in shade by home. Fire got her up and was arousable. EXAM: CT HEAD WITHOUT CONTRAST TECHNIQUE: Contiguous axial images were obtained from the base of the skull through the vertex without intravenous contrast. RADIATION DOSE REDUCTION: This exam was performed according to the departmental dose-optimization program which includes automated exposure control, adjustment of the mA and/or kV according to patient size and/or use of iterative reconstruction technique. COMPARISON:  CT head 01/13/2024  FINDINGS: Brain: No evidence of large-territorial acute infarction. No parenchymal hemorrhage. No mass lesion. No extra-axial collection. No mass effect or midline shift. No hydrocephalus. Basilar cisterns are patent. Vascular: No hyperdense vessel. Skull: No acute fracture or focal lesion. Sinuses/Orbits: Bilateral maxillary sinus, sphenoid sinus mucosal thickening. Partial right mastoid air cell effusion. No fluid within the middle ears. Otherwise paranasal sinuses and left mastoid air cells are clear. The orbits are unremarkable. Other: None. IMPRESSION: No acute intracranial abnormality. Electronically Signed   By: Morgane  Naveau M.D.   On: 01/15/2024 19:19   DG Chest  Portable 1 View Result Date: 01/15/2024 CLINICAL DATA:  Syncope. EXAM: PORTABLE CHEST 1 VIEW COMPARISON:  January 13, 2024. FINDINGS: The heart size and mediastinal contours are within normal limits. Both lungs are clear. The visualized skeletal structures are unremarkable. IMPRESSION: No active disease. Electronically Signed   By: Lynwood Landy Raddle M.D.   On: 01/15/2024 18:44     Procedures   Medications Ordered in the ED  lactated ringers  bolus 1,000 mL (0 mLs Intravenous Stopped 01/15/24 2021)  ALPRAZolam  (XANAX ) tablet 0.5 mg (0.5 mg Oral Given 01/15/24 1858)    Clinical Course as of 01/15/24 2126  Tue Jan 15, 2024  1758 D/c today from inpatient :   1. Hyperglycemia, ?HHS; insulin -dependent type II DM  - A1c was 10.9% in June 2025; serum glucose is 609 in ED with serum bicarb 18, normal AG, and mildly elevated beta hydroxybutyrate.  She was treated with insulin  drip and IV fluids initially and then changed to long-acting insulin .  She was able to tolerate a diet prior to discharge.   2. Acute encephalopathy; bipolar disorder this was resolved this was thought to be related to hyperglycemia. - No acute findings on head CT    -3. AKI resolved with IV fluids.   4. Hypertension  - on lisinopril      5 hypokalemia repleted    [TY]  2121 Patient's workup today largely reassuring.  Elevated blood sugar, but none.  Be in DKA.  No leukocytosis to suggest infectious process.  Stable anemia.  THC, but urine drug screen otherwise negative.  Tylenol  salicylate and ethanol level normal.  CK not suggestive of rhabdo.  She was anxious and tearful on arrival and received Xanax .  CT head negative for acute traumatic injury.  Chest x-ray negative for pneumonia or pneumothorax.  EKG without arrhythmia, ST segment changes to indicate ischemia.  As well as IV fluids.  Does have a history of bipolar schizophrenia, but I just spoke with mother on phone with patient who talks to her multiple times a day and states that patient is at her baseline regarding her bipolar and schizophrenia.  Patient adamantly denies SI, HI, hallucinations and has been compliant with her medications.  Heart rate documented in the 1 teens, however is in the 90s on the monitor while I was in the room.  We will therefore discharge.  Return precautions given [TY]    Clinical Course User Index [TY] Neysa Caron PARAS, DO                                 Medical Decision Making 48 year old female presented to the emergency department after being found on the ground in front of her home.  Per chart review discharge today for hyperglycemia/HHS/DKA.  Patient is afebrile, mildly elevated heart rate documented.  No obvious signs of trauma on exam, denies pain.  Will get broad workup.  Will check CT head for intracranial trauma, will get syncope type labs.  Patient does have bipolar/psych history and is somewhat tearful in the room.  Will get psych screening labs as well, however denies SI, HI, hallucinations.  See course for further MDM disposition.  Amount and/or Complexity of Data Reviewed Labs: ordered. Radiology: ordered. ECG/medicine tests: ordered.  Risk Prescription drug management.      Final diagnoses:  None    ED Discharge Orders     None  Neysa Caron PARAS, DO 01/15/24 2126

## 2024-01-15 NOTE — Progress Notes (Addendum)
   01/15/24 0909  TOC Brief Assessment  Insurance and Status Reviewed  Patient has primary care physician Yes  Home environment has been reviewed Resides in an apartment with children  Prior level of function: Independent with ADLs at baseline  Prior/Current Home Services No current home services  Social Drivers of Health Review SDOH reviewed no interventions necessary  Readmission risk has been reviewed Yes  Transition of care needs no transition of care needs at this time    Addendum: CSW notified by RN that patient was requesting a taxi voucher. Patient is ambulatory, so bus pass was offered. Patient refused bus pass. CSW followed up with Care Management supervisor, Josefa, regarding taxi voucher and approval was given.  However, patient left the floor prior to CSW providing the rider waiver to sign and taxi voucher. CSW and RN were unable to locate patient after she left the floor.

## 2024-01-16 NOTE — ED Triage Notes (Signed)
 Patient now reports she would like social work help with finding a better home and transportation.

## 2024-01-16 NOTE — ED Notes (Signed)
 Called to repeatg vitals x3 no answer

## 2024-01-16 NOTE — ED Notes (Signed)
Pt called x2 for vitals recheck. Pt could not be found.

## 2024-01-17 ENCOUNTER — Emergency Department (HOSPITAL_COMMUNITY)
Admission: EM | Admit: 2024-01-17 | Discharge: 2024-01-17 | Disposition: A | Discharge status: Home / Self Care | Attending: Emergency Medicine | Admitting: Emergency Medicine

## 2024-01-17 DIAGNOSIS — Z79899 Other long term (current) drug therapy: Secondary | ICD-10-CM | POA: Insufficient documentation

## 2024-01-17 DIAGNOSIS — I10 Essential (primary) hypertension: Secondary | ICD-10-CM | POA: Insufficient documentation

## 2024-01-17 DIAGNOSIS — F419 Anxiety disorder, unspecified: Secondary | ICD-10-CM | POA: Insufficient documentation

## 2024-01-17 DIAGNOSIS — Z794 Long term (current) use of insulin: Secondary | ICD-10-CM | POA: Diagnosis not present

## 2024-01-17 NOTE — ED Notes (Addendum)
 Pt refused all assessment and blood work offer by ED staff. EDP explained the risk of not allowing any assessment to be done, but the pt adamantly reused. Pt refused to talk with registration staff.

## 2024-01-17 NOTE — ED Triage Notes (Signed)
 Patient arrived via [facility] with complaint of hyperactivity/flight of ideas, states she may be manic. Also reports out of some of her medications. Declines SI/HI. Given 5mg  Haldol  prior to arrival.

## 2024-01-17 NOTE — ED Provider Notes (Signed)
 [place] EMERGENCY DEPARTMENT AT [facility] Provider Note   CSN: [id number] Arrival date & time: 01/17/24  2050     Patient presents with: Medical Clearance   [name] is a 48 y.o. female.   Patient arrives with EMS.  She states that she was feeling anxious prior to coming here.  She got Haldol  with EMS and she is feeling better.  She denies any chest pain abdominal pain nausea vomiting diarrhea.  She would like to go home now because she is feeling better.  She is not having any suicidal homicidal ideation.  She does not feel like she is having any difficulty with her anxiousness anymore.  She has a history of bipolar diabetes IBS.  The history is provided by the patient.       Prior to Admission medications   Medication Sig Start Date End Date Taking? Authorizing Provider  benztropine  (COGENTIN ) 1 MG tablet Take 1 tablet (1 mg total) by mouth 2 (two) times daily. Patient taking differently: Take 1 mg by mouth at bedtime. 12/21/22   Tanda Bleacher, MD  CLARITIN 10 MG tablet Take 10 mg by mouth daily as needed for allergies, rhinitis or itching.    [provider]  ferrous sulfate  325 (65 FE) MG tablet Take 1 tablet (325 mg total) by mouth daily with breakfast. 02/16/23   Tanda Bleacher, MD  haloperidol  (HALDOL ) 5 MG tablet Take 1 tablet (5 mg total) by mouth 2 (two) times daily. Patient taking differently: Take 5-10 mg by mouth at bedtime. 12/13/23   Ghimire, Donalda HERO, MD  haloperidol  decanoate (HALDOL  DECANOATE) 100 MG/ML injection Inject 100 mg into the muscle every 28 (twenty-eight) days. 10/19/23   [provider]  insulin  NPH-regular Human (NOVOLIN  70/30 RELION) (70-30) 100 UNIT/ML injection Inject 30 Units into the skin in the morning and at bedtime.    [provider]  Insulin  Pen Needle (PEN NEEDLES) 31G X 5 MM MISC 1 each by Does not apply route daily. 10/02/23   Crain, Whitney L, PA  Lancet Device MISC 1 each by Does not apply route 3  (three) times daily. May dispense any manufacturer covered by patient's insurance. 12/21/22   Tanda Bleacher, MD  lisinopril  (ZESTRIL ) 20 MG tablet Take 20 mg by mouth at bedtime. 10/18/23   [provider]  PARoxetine  (PAXIL ) 20 MG tablet Take 1 tablet (20 mg total) by mouth daily. Patient taking differently: Take 20 mg by mouth at bedtime. 12/21/22   Tanda Bleacher, MD  thiamine  (VITAMIN B-1) 100 MG tablet Take 1 tablet (100 mg total) by mouth daily. 12/14/23   Ghimire, Donalda HERO, MD  traZODone  (DESYREL ) 50 MG tablet Take 50 mg by mouth at bedtime as needed for sleep.    [provider]  VITAMIN E PO Take 1 capsule by mouth daily.    [provider]    Allergies: Patient has no known allergies.    Review of Systems  Updated Vital Signs BP (!) 183/112 (BP Location: Left Arm)   Pulse (!) 110   Temp 98.2 F (36.8 C) (Oral)   Resp 18   LMP  (LMP Unknown)   SpO2 95%   Physical Exam Vitals and nursing note reviewed.  Constitutional:      General: She is not in acute distress.    Appearance: She is well-developed. She is not ill-appearing.  HENT:     Head: Normocephalic and atraumatic.     Nose: Nose normal.  Mouth/Throat:     Mouth: Mucous membranes are moist.  Eyes:     Extraocular Movements: Extraocular movements intact.     Conjunctiva/sclera: Conjunctivae normal.     Pupils: Pupils are equal, round, and reactive to light.  Cardiovascular:     Rate and Rhythm: Normal rate and regular rhythm.     Pulses: Normal pulses.     Heart sounds: Normal heart sounds. No murmur heard. Pulmonary:     Effort: Pulmonary effort is normal. No respiratory distress.     Breath sounds: Normal breath sounds.  Abdominal:     Palpations: Abdomen is soft.     Tenderness: There is no abdominal tenderness.  Musculoskeletal:        General: No swelling.     Cervical back: Normal range of motion and neck supple.  Skin:    General: Skin is warm and dry.     Capillary  Refill: Capillary refill takes less than 2 seconds.  Neurological:     General: No focal deficit present.     Mental Status: She is alert.     Comments: She has normal strength normal sensation  Psychiatric:     Comments: Patient seems calm, she does not appear to be responding to internal stimuli, she denies SI and HI     (all labs ordered are listed, but only abnormal results are displayed) Labs Reviewed - No data to display  EKG: None  Radiology: No results found.   Procedures   Medications Ordered in the ED - No data to display                                  Medical Decision Making  Pasty Manninen is here with anxiety agitation.  She has a history of bipolar hypertension.  She got Haldol  with EMS and she states she is feeling better.  When I evaluate her she denies SI HI.  She does not want any blood work done.  She has no abdominal pain chest pain shortness of breath weakness numbness tingling.  She would like to go home.  She answers questions appropriately.  She does not appear to be responding to internal stimuli.  She has food with her.  Ultimately she declined any medical care further for me.  She appears to have capacity to make this decision.  Patient discharged.  This chart was dictated using voice recognition software.  Despite best efforts to proofread,  errors can occur which can change the documentation meaning.      Final diagnoses:  Anxiety    ED Discharge Orders     None          Ruthe Cornet, DO 01/17/24 2131

## 2024-01-17 NOTE — ED Notes (Signed)
 Pt refused EKG.

## 2024-01-22 ENCOUNTER — Encounter (HOSPITAL_COMMUNITY): Payer: Self-pay

## 2024-01-22 ENCOUNTER — Emergency Department (HOSPITAL_COMMUNITY)

## 2024-01-22 ENCOUNTER — Emergency Department (HOSPITAL_COMMUNITY)
Admission: EM | Admit: 2024-01-22 | Discharge: 2024-01-22 | Discharge status: Against Medical Advice | Attending: Emergency Medicine | Admitting: Emergency Medicine

## 2024-01-22 ENCOUNTER — Other Ambulatory Visit: Payer: Self-pay

## 2024-01-22 ENCOUNTER — Emergency Department (EMERGENCY_DEPARTMENT_HOSPITAL)
Admission: EM | Admit: 2024-01-22 | Discharge: 2024-01-26 | Disposition: A | Discharge status: Other Acute Inpatient Hospital | Source: Home / Self Care | Attending: Emergency Medicine | Admitting: Emergency Medicine

## 2024-01-22 DIAGNOSIS — Z5329 Procedure and treatment not carried out because of patient's decision for other reasons: Secondary | ICD-10-CM | POA: Diagnosis not present

## 2024-01-22 DIAGNOSIS — I10 Essential (primary) hypertension: Secondary | ICD-10-CM | POA: Diagnosis not present

## 2024-01-22 DIAGNOSIS — F410 Panic disorder [episodic paroxysmal anxiety] without agoraphobia: Secondary | ICD-10-CM | POA: Insufficient documentation

## 2024-01-22 DIAGNOSIS — F1910 Other psychoactive substance abuse, uncomplicated: Secondary | ICD-10-CM | POA: Insufficient documentation

## 2024-01-22 DIAGNOSIS — F29 Unspecified psychosis not due to a substance or known physiological condition: Secondary | ICD-10-CM | POA: Diagnosis not present

## 2024-01-22 DIAGNOSIS — E1165 Type 2 diabetes mellitus with hyperglycemia: Secondary | ICD-10-CM | POA: Diagnosis not present

## 2024-01-22 DIAGNOSIS — D72829 Elevated white blood cell count, unspecified: Secondary | ICD-10-CM | POA: Insufficient documentation

## 2024-01-22 DIAGNOSIS — E8729 Other acidosis: Secondary | ICD-10-CM

## 2024-01-22 DIAGNOSIS — F259 Schizoaffective disorder, unspecified: Secondary | ICD-10-CM | POA: Diagnosis present

## 2024-01-22 DIAGNOSIS — F250 Schizoaffective disorder, bipolar type: Secondary | ICD-10-CM | POA: Diagnosis present

## 2024-01-22 DIAGNOSIS — F22 Delusional disorders: Secondary | ICD-10-CM | POA: Diagnosis not present

## 2024-01-22 DIAGNOSIS — E1065 Type 1 diabetes mellitus with hyperglycemia: Secondary | ICD-10-CM | POA: Insufficient documentation

## 2024-01-22 DIAGNOSIS — R739 Hyperglycemia, unspecified: Secondary | ICD-10-CM

## 2024-01-22 DIAGNOSIS — Z79899 Other long term (current) drug therapy: Secondary | ICD-10-CM | POA: Diagnosis not present

## 2024-01-22 DIAGNOSIS — R440 Auditory hallucinations: Secondary | ICD-10-CM | POA: Diagnosis present

## 2024-01-22 DIAGNOSIS — R000 Tachycardia, unspecified: Secondary | ICD-10-CM | POA: Diagnosis not present

## 2024-01-22 DIAGNOSIS — R079 Chest pain, unspecified: Secondary | ICD-10-CM | POA: Insufficient documentation

## 2024-01-22 DIAGNOSIS — Z794 Long term (current) use of insulin: Secondary | ICD-10-CM | POA: Diagnosis not present

## 2024-01-22 LAB — COMPREHENSIVE METABOLIC PANEL WITH GFR
ALT: 26 U/L (ref 0–44)
ALT: 26 U/L (ref 0–44)
AST: 23 U/L (ref 15–41)
AST: 21 U/L (ref 15–41)
Albumin: 3.9 g/dL (ref 3.5–5.0)
Albumin: 4 g/dL (ref 3.5–5.0)
Alkaline Phosphatase: 79 U/L (ref 38–126)
Alkaline Phosphatase: 79 U/L (ref 38–126)
Anion gap: 18 — ABNORMAL HIGH (ref 5–15)
Anion gap: 15 (ref 5–15)
BUN: 15 mg/dL (ref 6–20)
BUN: 14 mg/dL (ref 6–20)
CO2: 19 mmol/L — ABNORMAL LOW (ref 22–32)
CO2: 22 mmol/L (ref 22–32)
Calcium: 9.8 mg/dL (ref 8.9–10.3)
Calcium: 9.9 mg/dL (ref 8.9–10.3)
Chloride: 99 mmol/L (ref 98–111)
Chloride: 99 mmol/L (ref 98–111)
Creatinine, Ser: 1.27 mg/dL — ABNORMAL HIGH (ref 0.44–1.00)
Creatinine, Ser: 1.37 mg/dL — ABNORMAL HIGH (ref 0.44–1.00)
GFR, Estimated: 53 mL/min — ABNORMAL LOW (ref >60)
GFR, Estimated: 48 mL/min — ABNORMAL LOW (ref >60)
Glucose, Bld: 443 mg/dL — ABNORMAL HIGH (ref 70–99)
Glucose, Bld: 403 mg/dL — ABNORMAL HIGH (ref 70–99)
Potassium: 3.8 mmol/L (ref 3.5–5.1)
Potassium: 3.9 mmol/L (ref 3.5–5.1)
Sodium: 136 mmol/L (ref 135–145)
Sodium: 136 mmol/L (ref 135–145)
Total Bilirubin: 1.4 mg/dL — ABNORMAL HIGH (ref 0.0–1.2)
Total Bilirubin: 1.5 mg/dL — ABNORMAL HIGH (ref 0.0–1.2)
Total Protein: 7.2 g/dL (ref 6.5–8.1)
Total Protein: 7.3 g/dL (ref 6.5–8.1)

## 2024-01-22 LAB — BASIC METABOLIC PANEL WITH GFR
Anion gap: 16 — ABNORMAL HIGH (ref 5–15)
BUN: 16 mg/dL (ref 6–20)
CO2: 19 mmol/L — ABNORMAL LOW (ref 22–32)
Calcium: 9.9 mg/dL (ref 8.9–10.3)
Chloride: 98 mmol/L (ref 98–111)
Creatinine, Ser: 1.57 mg/dL — ABNORMAL HIGH (ref 0.44–1.00)
GFR, Estimated: 41 mL/min — ABNORMAL LOW (ref >60)
Glucose, Bld: 559 mg/dL (ref 70–99)
Potassium: 3.9 mmol/L (ref 3.5–5.1)
Sodium: 133 mmol/L — ABNORMAL LOW (ref 135–145)

## 2024-01-22 LAB — BETA-HYDROXYBUTYRIC ACID: Beta-Hydroxybutyric Acid: 2.03 mmol/L — ABNORMAL HIGH (ref 0.05–0.27)

## 2024-01-22 LAB — CBC
HCT: 37.6 % (ref 36.0–46.0)
HCT: 39.4 % (ref 36.0–46.0)
Hemoglobin: 12.2 g/dL (ref 12.0–15.0)
Hemoglobin: 12.8 g/dL (ref 12.0–15.0)
MCH: 27 pg (ref 26.0–34.0)
MCH: 26.8 pg (ref 26.0–34.0)
MCHC: 32.4 g/dL (ref 30.0–36.0)
MCHC: 32.5 g/dL (ref 30.0–36.0)
MCV: 83.2 fL (ref 80.0–100.0)
MCV: 82.4 fL (ref 80.0–100.0)
Platelets: 342 K/uL (ref 150–400)
Platelets: 410 K/uL — ABNORMAL HIGH (ref 150–400)
RBC: 4.52 MIL/uL (ref 3.87–5.11)
RBC: 4.78 MIL/uL (ref 3.87–5.11)
RDW: 14.4 % (ref 11.5–15.5)
RDW: 14.4 % (ref 11.5–15.5)
WBC: 12.9 K/uL — ABNORMAL HIGH (ref 4.0–10.5)
WBC: 12.1 K/uL — ABNORMAL HIGH (ref 4.0–10.5)
nRBC: 0 % (ref 0.0–0.2)
nRBC: 0 % (ref 0.0–0.2)

## 2024-01-22 LAB — SALICYLATE LEVEL: Salicylate Lvl: <7 mg/dL — ABNORMAL LOW (ref 7.0–30.0)

## 2024-01-22 LAB — HCG, SERUM, QUALITATIVE: Preg, Serum: NEGATIVE

## 2024-01-22 LAB — ACETAMINOPHEN LEVEL: Acetaminophen (Tylenol), Serum: <10 ug/mL — ABNORMAL LOW (ref 10–30)

## 2024-01-22 LAB — TROPONIN I (HIGH SENSITIVITY)
Troponin I (High Sensitivity): 13 ng/L (ref <18)
Troponin I (High Sensitivity): 14 ng/L (ref <18)

## 2024-01-22 LAB — CBG MONITORING, ED
Glucose-Capillary: 464 mg/dL — ABNORMAL HIGH (ref 70–99)
Glucose-Capillary: 459 mg/dL — ABNORMAL HIGH (ref 70–99)
Glucose-Capillary: 373 mg/dL — ABNORMAL HIGH (ref 70–99)

## 2024-01-22 LAB — ETHANOL
Alcohol, Ethyl (B): <15 mg/dL (ref <15)
Alcohol, Ethyl (B): <15 mg/dL (ref <15)

## 2024-01-22 MED ORDER — OLANZAPINE 5 MG PO TBDP
5.0000 mg | ORAL_TABLET | Freq: Three times a day (TID) | ORAL | Status: DC | PRN
  Administered 2024-01-23 – 2024-01-26 (×5): 5 mg via ORAL
  Filled 2024-01-22 (×5): qty 1

## 2024-01-22 MED ORDER — SODIUM CHLORIDE 0.9 % IV BOLUS: 1000.0000 mL | Freq: Once | INTRAVENOUS | Status: DC

## 2024-01-22 MED ORDER — ZIPRASIDONE MESYLATE 20 MG IM SOLR
20.0000 mg | INTRAMUSCULAR | Status: AC | PRN
  Administered 2024-01-24: 20 mg via INTRAMUSCULAR
  Filled 2024-01-22: qty 20

## 2024-01-22 MED ORDER — LORAZEPAM 1 MG PO TABS
1.0000 mg | ORAL_TABLET | ORAL | Status: AC | PRN
  Administered 2024-01-23: 1 mg via ORAL
  Filled 2024-01-22: qty 1

## 2024-01-22 MED ORDER — INSULIN ASPART 100 UNIT/ML IJ SOLN
0.0000 [IU] | Freq: Three times a day (TID) | INTRAMUSCULAR | Status: DC
  Administered 2024-01-23: 11 [IU] via SUBCUTANEOUS
  Administered 2024-01-23: 3 [IU] via SUBCUTANEOUS
  Administered 2024-01-23: 15 [IU] via SUBCUTANEOUS
  Administered 2024-01-24 (×2): 8 [IU] via SUBCUTANEOUS
  Administered 2024-01-24: 3 [IU] via SUBCUTANEOUS
  Administered 2024-01-25: 11 [IU] via SUBCUTANEOUS
  Administered 2024-01-25: 3 [IU] via SUBCUTANEOUS
  Administered 2024-01-25: 5 [IU] via SUBCUTANEOUS
  Administered 2024-01-26: 8 [IU] via SUBCUTANEOUS
  Administered 2024-01-26: 5 [IU] via SUBCUTANEOUS

## 2024-01-22 MED ORDER — INSULIN ASPART 100 UNIT/ML IJ SOLN
10.0000 [IU] | Freq: Once | INTRAMUSCULAR | Status: DC
Start: 2024-01-22 — End: 2024-01-22
  Administered 2024-01-22: 10 [IU] via SUBCUTANEOUS

## 2024-01-22 MED ORDER — SODIUM CHLORIDE 0.9 % IV BOLUS
2000.0000 mL | Freq: Once | INTRAVENOUS | Status: AC
Start: 2024-01-22 — End: 2024-01-23
  Administered 2024-01-22: 2000 mL via INTRAVENOUS

## 2024-01-22 NOTE — ED Notes (Signed)
 I was in different room providing patient care, however when I returned to nurses station I was advised that patient had eloped.

## 2024-01-22 NOTE — TOC Initial Note (Signed)
 Transition of Care [facility]) - Initial/Assessment Note    Patient Details  Name: [name] MRN: [id number] Date of Birth: 1976-06-01  Transition of Care [facility]) CM/SW Contact:    Jaiden Dinkins K Summers Buendia, LCSW Phone Number: 01/22/2024, 9:38 PM  Clinical Narrative:                 Patient is a 48 y/o female who presented in the ED after being found by EMS at gas station sleeping on the floor. Patient has a history of substance abuse (crack, meth), diabetic, schizophrenia and bipolar disorder. Patient lives alone, has been non compliant with not taking medications, uncontrolled diabetes. Patient was connected with an ACT team in [place], [place] while living with her mother and step father. Currently it is not known if she is connected with any mental health services or ACT team. Patient's mother is involved [name] [phone]) who lives 3hrs away.   Patient is experiencing hallucinations, paranoia (stating I don't want to see him in court) speaking about her ex husband, hearing voices about her heart telling her she is having a heart attack. Patient has poor hygiene.    Patient blood sugar is severely high in the 8,000's.      Patient Goals and CMS Choice Stabilizations Med Mgmt  Education on importance of checking blood sugars and maintaining her diabetes.            Expected Discharge Plan and Services SW advocated for patient to be IVC to [facility], to get her stabilized with med mgmt, connected with mental health services and being admitted in [facility]                                             Prior Living Arrangements/Services Lives alone                      Activities of Daily Living      Permission Sought/Granted                  Emotional Assessment Patient is tearful, she expressed she is scared (patient looking around, whispering to SW about ex husband). Patient is hearing voices, grandiose thoughts, and increase in paranoia. SW  redirected the patient to take deep breaths to calm herself down from crying, shaking, encouraging her everything will be okay and she is safe.            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   Candida vaginitis 04/11/2021   Type 2 diabetes mellitus without complication, without long-term current use of insulin  (HCC) 04/18/2020   Essential hypertension 04/08/2019   Class 2 severe obesity due to excess calories with serious comorbidity and body mass index (BMI) of 39.0 to 39.9 in adult [facility]) 04/08/2019   Nicotine  abuse 04/08/2019   Bipolar disorder (HCC) 05/18/2018   Diabetes mellitus (HCC) 08/10/2016   Bipolar  disorder, curr episode mixed, severe, with psychotic features (HCC) 08/07/2016   Cannabis use disorder, moderate, dependence (HCC) 08/07/2016   CHOLECYSTITIS, UNSPECIFIED 06/12/2008   Calculus of gallbladder 06/10/2008   CERUMEN IMPACTION, BILATERAL 01/17/2008   PHARYNGITIS, VIRAL 01/17/2008   NECK PAIN 01/17/2008   PCP:  [facility] Pharmacy:   [facility] [address], [place] - [address] [place] [address] Phone: [phone] Fax: [phone]     Social Drivers of Health (SDOH) Social History: SDOH Screenings   Food Insecurity: No Food Insecurity (12/12/2023)  Housing: High Risk (12/12/2023)  Transportation Needs: Unmet Transportation Needs (12/12/2023)  Utilities: At Risk  (12/12/2023)  Alcohol Screen: Low Risk  (08/22/2023)  Depression (PHQ2-9): High Risk (10/05/2023)  Financial Resource Strain: Medium Risk (08/22/2023)  Physical Activity: Insufficiently Active (08/22/2023)  Social Connections: Moderately Integrated (08/22/2023)  Stress: Stress Concern Present (08/22/2023)  Tobacco Use: Medium Risk (01/22/2024)  Health Literacy: Low Risk  (11/16/2022)   Received from [facility]   SDOH Interventions:     Readmission Risk Interventions     No data to display         .Elston Search, MSW, LCSWA Transition of Care  Clinical Social Worker (ED 3-11 Mon-Fri)  [phone]

## 2024-01-22 NOTE — ED Triage Notes (Addendum)
 Patient arrives with EMS from the gas station found on the floor. By standers thought she was having a seizure, but the manager of the gas station showed EMS the video and patient appears to have just thrown thrown herself on the floor. Manager says she has done this before and tries to sleep their. Patient arrives crying-loudly. Patient states she is having chest pain and a panic attack.  HX of bi-polar disorder and schizophrenia.   Patient states she has not taken her medicine in 5-6 weeks. States she is supposed to take 70/30 insulin  twice a day. Denies SI/HI. Patient appears to be talking to herself, speaking of seeing bears.

## 2024-01-22 NOTE — ED Triage Notes (Addendum)
 Pt here for crack and meth. Pt c/o being hot outside. BS 454. Axox4. Pt is SI

## 2024-01-22 NOTE — ED Notes (Signed)
IVC Paperwork in progress

## 2024-01-22 NOTE — ED Provider Notes (Signed)
 [place] EMERGENCY DEPARTMENT AT [facility] Provider Note   CSN: [id number] Arrival date & time: 01/22/24  1432     Patient presents with: Drug Problem   [name] is a 48 y.o. female with history of bipolar, schizophrenia, diabetes, hypertension presents with visual hallucinations.  She denies any auditory or command hallucinations.  She states she will occasionally drink alcohol but not daily.  Does not feel like she is in withdrawal.  Denies any drug use here to me today.  However in the triage note states that patient is here for  crack and meth, complaining of being hot outside and SI.  Patient denies any HI or SI to me today.  She was noted to have a BS of 454 in triage.  She states that she is typically on insulin  but has not taken it in the past few weeks.  She denies any abdominal pain, nausea or vomiting.  No chest pain or shortness of breath.    Drug Problem   Past Medical History:  Diagnosis Date   Abnormal Pap smear    Bipolar 1 disorder (HCC)    Diabetes mellitus type I (HCC)    Diabetes mellitus without complication (HCC)    Fibroids    Hypertension    IBS (irritable bowel syndrome)    Past Surgical History:  Procedure Laterality Date   CERVICAL BIOPSY     CHOLECYSTECTOMY     ESSURE TUBAL LIGATION     HIATAL HERNIA REPAIR     TUBAL LIGATION         Prior to Admission medications   Medication Sig Start Date End Date Taking? Authorizing Provider  benztropine  (COGENTIN ) 1 MG tablet Take 1 tablet (1 mg total) by mouth 2 (two) times daily. Patient taking differently: Take 1 mg by mouth at bedtime. 12/21/22   Tanda Bleacher, MD  CLARITIN 10 MG tablet Take 10 mg by mouth daily as needed for allergies, rhinitis or itching.    [provider]  ferrous sulfate  325 (65 FE) MG tablet Take 1 tablet (325 mg total) by mouth daily with breakfast. 02/16/23   Tanda Bleacher, MD  haloperidol  (HALDOL ) 5 MG tablet Take 1 tablet (5 mg total) by mouth 2  (two) times daily. Patient taking differently: Take 5-10 mg by mouth at bedtime. 12/13/23   Ghimire, Donalda HERO, MD  haloperidol  decanoate (HALDOL  DECANOATE) 100 MG/ML injection Inject 100 mg into the muscle every 28 (twenty-eight) days. 10/19/23   [provider]  insulin  NPH-regular Human (NOVOLIN  70/30 RELION) (70-30) 100 UNIT/ML injection Inject 30 Units into the skin in the morning and at bedtime.    [provider]  Insulin  Pen Needle (PEN NEEDLES) 31G X 5 MM MISC 1 each by Does not apply route daily. 10/02/23   Crain, Whitney L, PA  Lancet Device MISC 1 each by Does not apply route 3 (three) times daily. May dispense any manufacturer covered by patient's insurance. 12/21/22   Tanda Bleacher, MD  lisinopril  (ZESTRIL ) 20 MG tablet Take 20 mg by mouth at bedtime. 10/18/23   [provider]  PARoxetine  (PAXIL ) 20 MG tablet Take 1 tablet (20 mg total) by mouth daily. Patient taking differently: Take 20 mg by mouth at bedtime. 12/21/22   Tanda Bleacher, MD  thiamine  (VITAMIN B-1) 100 MG tablet Take 1 tablet (100 mg total) by mouth daily. 12/14/23   Ghimire, Donalda HERO, MD  traZODone  (DESYREL ) 50 MG tablet Take 50 mg by mouth at bedtime as  needed for sleep.    [provider]  VITAMIN E PO Take 1 capsule by mouth daily.    [provider]    Allergies: Patient has no known allergies.    Review of Systems  Psychiatric/Behavioral:  Positive for hallucinations. Negative for suicidal ideas.     Updated Vital Signs BP 101/79   Pulse 99   Temp (!) 97.4 F (36.3 C) (Oral)   Resp 12   Ht 5' 11 (1.803 m)   Wt 115.7 kg   LMP  (LMP Unknown)   SpO2 100%   BMI 35.57 kg/m   Physical Exam Vitals and nursing note reviewed.  Constitutional:      General: She is not in acute distress.    Appearance: She is well-developed.  HENT:     Head: Normocephalic and atraumatic.  Eyes:     Conjunctiva/sclera: Conjunctivae normal.  Cardiovascular:     Rate and Rhythm:  Normal rate and regular rhythm.     Heart sounds: No murmur heard. Pulmonary:     Effort: Pulmonary effort is normal. No respiratory distress.     Breath sounds: Normal breath sounds.  Abdominal:     Palpations: Abdomen is soft.     Tenderness: There is no abdominal tenderness.  Musculoskeletal:        General: No swelling.     Cervical back: Neck supple.  Skin:    General: Skin is warm and dry.     Capillary Refill: Capillary refill takes less than 2 seconds.  Neurological:     Mental Status: She is alert.     Comments: Patient is alert and oriented. There is no abnormal phonation. Symmetric smile without facial droop. Moves all extremities spontaneously. 5/5 strength in upper and lower extremities. . No sensation deficit. There is no nystagmus. EOMI, PERRL. Coordination intact with finger to nose and normal ambulation.  Does not appear to be responding to external stimuli.  Appears at full capacity.   Psychiatric:        Mood and Affect: Mood normal.     (all labs ordered are listed, but only abnormal results are displayed) Labs Reviewed  CBC - Abnormal; Notable for the following components:      Result Value   WBC 12.9 (*)    All other components within normal limits  ACETAMINOPHEN  LEVEL - Abnormal; Notable for the following components:   Acetaminophen  (Tylenol ), Serum <10 (*)    All other components within normal limits  SALICYLATE LEVEL - Abnormal; Notable for the following components:   Salicylate Lvl <7.0 (*)    All other components within normal limits  BETA-HYDROXYBUTYRIC ACID - Abnormal; Notable for the following components:   Beta-Hydroxybutyric Acid 2.03 (*)    All other components within normal limits  COMPREHENSIVE METABOLIC PANEL WITH GFR - Abnormal; Notable for the following components:   CO2 19 (*)    Glucose, Bld 443 (*)    Creatinine, Ser 1.27 (*)    Total Bilirubin 1.4 (*)    GFR, Estimated 53 (*)    Anion gap 18 (*)    All other components within  normal limits  CBG MONITORING, ED - Abnormal; Notable for the following components:   Glucose-Capillary 464 (*)    All other components within normal limits  ETHANOL  RAPID URINE DRUG SCREEN, HOSP PERFORMED    EKG: EKG Interpretation Date/Time:  Tuesday January 22 2024 14:44:13 EDT Ventricular Rate:  104 PR Interval:  145 QRS Duration:  85 QT  Interval:  333 QTC Calculation: 438 R Axis:   -14  Text Interpretation: Sinus tachycardia ST elev, probable normal early repol pattern Confirmed by Jerrol Agent ([id number]) on 01/22/2024 4:31:23 PM  Radiology: No results found.   Procedures   Medications Ordered in the ED  sodium chloride  0.9 % bolus 1,000 mL (has no administration in time range)                                    Medical Decision Making  This patient presents to the ED with chief complaint(s) of hallucinations.  The complaint involves an extensive differential diagnosis and also carries with it a high risk of complications and morbidity.   Pertinent past medical history as listed in HPI  The differential diagnosis includes  Drug/alcohol intoxication/withdrawal, psych, DKA Additional history obtained: Records reviewed Care Everywhere/External Records  Assessment and management:   Nontoxic-appearing patient presenting with complaints of visual hallucinations.  She does state that this is common for her, however they seem to be more vivid within the past few days.  She reports that she has been taking her psych meds.  No head traumas reported.  Normocephalic atraumatic.  Overall she is without any gross neurodeficits on exam.  She reported drug use and SI to triage, however she denies both of these during my exam.  Says she does not feel like she is going through alcohol withdrawal.  She has no tongue fasciculations or tremors noted.  She is minimally tachycardic to 102.  She is not complaining of any chest pain, shortness of breath, abdominal pain.  Her lungs are clear.   Abdomen is nontender.  EKG appears unchanged.  Hallucinations are not command or auditory.  Overall do not suspect central process, most consistent with psychiatric hallucinations.  Patient does have known bipolar/schizophrenia.  Given BS 454 will obtain routine labs.  Labs are concerning for early DKA with an anion gap of 18, bicarb of 19, CBG 464, beta hydroxy 2.03, white count of 12.9.  Will begin aggressive IV fluids.  Patient eloped before any therapy could be initiated.  Independent ECG interpretation:  Sinus tachycardia, unchanged  Independent labs interpretation:  The following labs were independently interpreted:  CBG 454  Independent visualization and interpretation of imaging: I independently visualized the following imaging with scope of interpretation limited to determining acute life threatening conditions related to emergency care: none    Consultations obtained:   none  Disposition:   Patient eloped  Social Determinants of Health:   none  This note was dictated with voice recognition software.  Despite best efforts at proofreading, errors may have occurred which can change the documentation meaning.       Final diagnoses:  Polysubstance abuse (HCC)  Increased anion gap metabolic acidosis  Hyperglycemia    ED Discharge Orders     None          Donnajean Agent VEAR DEVONNA 01/22/24 1635    Jerrol Agent, MD 01/22/24 [phone]
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
## 2024-01-23 DIAGNOSIS — F22 Delusional disorders: Secondary | ICD-10-CM | POA: Diagnosis not present

## 2024-01-23 DIAGNOSIS — F250 Schizoaffective disorder, bipolar type: Secondary | ICD-10-CM

## 2024-01-23 LAB — RAPID URINE DRUG SCREEN, HOSP PERFORMED
Amphetamines: NOT DETECTED
Barbiturates: NOT DETECTED
Benzodiazepines: NOT DETECTED
Cocaine: NOT DETECTED
Opiates: NOT DETECTED
Tetrahydrocannabinol: POSITIVE — AB

## 2024-01-23 LAB — CBG MONITORING, ED
Glucose-Capillary: 174 mg/dL — ABNORMAL HIGH (ref 70–99)
Glucose-Capillary: 382 mg/dL — ABNORMAL HIGH (ref 70–99)
Glucose-Capillary: 320 mg/dL — ABNORMAL HIGH (ref 70–99)
Glucose-Capillary: 275 mg/dL — ABNORMAL HIGH (ref 70–99)

## 2024-01-23 LAB — URINALYSIS, ROUTINE W REFLEX MICROSCOPIC
Bacteria, UA: NONE SEEN
Bilirubin Urine: NEGATIVE
Glucose, UA: >=500 mg/dL — AB
Hgb urine dipstick: NEGATIVE
Ketones, ur: 20 mg/dL — AB
Leukocytes,Ua: NEGATIVE
Nitrite: NEGATIVE
Protein, ur: NEGATIVE mg/dL
Specific Gravity, Urine: 1.029 (ref 1.005–1.030)
pH: 5 (ref 5.0–8.0)

## 2024-01-23 MED ORDER — BENZTROPINE MESYLATE 1 MG PO TABS
1.0000 mg | ORAL_TABLET | Freq: Every day | ORAL | Status: DC
  Administered 2024-01-23 – 2024-01-25 (×3): 1 mg via ORAL
  Filled 2024-01-23 (×3): qty 1

## 2024-01-23 MED ORDER — IBUPROFEN 400 MG PO TABS
600.0000 mg | ORAL_TABLET | Freq: Four times a day (QID) | ORAL | Status: DC | PRN
  Administered 2024-01-24: 600 mg via ORAL
  Filled 2024-01-23 (×2): qty 1

## 2024-01-23 MED ORDER — HALOPERIDOL 5 MG PO TABS
5.0000 mg | ORAL_TABLET | Freq: Two times a day (BID) | ORAL | Status: DC
  Administered 2024-01-23 – 2024-01-26 (×7): 5 mg via ORAL
  Filled 2024-01-23 (×7): qty 1

## 2024-01-23 MED ORDER — ACETAMINOPHEN 325 MG PO TABS: 650.0000 mg | ORAL_TABLET | Freq: Four times a day (QID) | ORAL | Status: DC | PRN

## 2024-01-23 MED ORDER — PAROXETINE HCL 20 MG PO TABS
20.0000 mg | ORAL_TABLET | Freq: Every day | ORAL | Status: DC
  Administered 2024-01-23 – 2024-01-25 (×3): 20 mg via ORAL
  Filled 2024-01-23 (×3): qty 1

## 2024-01-23 MED ORDER — TRAZODONE HCL 50 MG PO TABS
50.0000 mg | ORAL_TABLET | Freq: Every evening | ORAL | Status: DC | PRN
  Administered 2024-01-25: 50 mg via ORAL
  Filled 2024-01-23: qty 1

## 2024-01-23 NOTE — Progress Notes (Signed)
 LCSW Progress Note  [id number]   [name]  01/23/2024  12:41 PM  Description:   Inpatient Psychiatric Referral  Patient was recommended inpatient per Elveria Batter NP. There are no available beds at [facility], per [facility] Cherylynn Ernst RN. Patient was referred to the following out of network facilities:   [facility] Provider Address Phone Fax  [facility]  [address] [place] [address] [phone]  [facility]  [address] [place] [address] [phone]  [facility]  [address] [place], [place] [address] [phone]  [facility]  [address] [place], [place] [address] [phone]  [facility]  [address] [place] [address] [phone]  [facility]  [address], [place] [address] [phone]  [facility]  [address], [place] [address] [phone]  [facility] EFAX  [address] [place], [place] [phone]  [facility]  [address] [place] [address] [phone]  [facility]  [address] [place] [address] [phone]      Situation ongoing, CSW to continue following and update chart as more information becomes available.      [place] Orman Matsumura, MSW, LCSW  01/23/2024 12:41 PM

## 2024-01-23 NOTE — ED Notes (Signed)
 Pt still agitated and stated she felt like her heart was racing, prn ativan  given

## 2024-01-23 NOTE — ED Notes (Addendum)
 Patient cannot be accepted into [facility] until [facility] is under 250. Last check was 320 after insulin  administration

## 2024-01-23 NOTE — Progress Notes (Addendum)
 Pt has been accepted to [facility] TODAY 01/23/2024  Bed assignment: Main campus  Pt meets inpatient criteria per: Elveria Batter NP  Attending Physician will be Millie Manners, MD  Report can be called to: [phone] (this is a pager, please leave call-back number when giving report)  Pt can arrive after Blood Sugar is under 250  Care Team Notified:  Elveria Batter NP, harlene Branch RN  [place] Inesha Sow LCSW-A   01/23/2024 12:50 PM

## 2024-01-23 NOTE — ED Notes (Signed)
 IVC paperwork completed and e-filed. Copies are on the clipboard in blue zone until the pt moves. Expires 01/29/24.

## 2024-01-23 NOTE — Inpatient Diabetes Management (Signed)
 Inpatient Diabetes Program Recommendations  AACE/ADA: New Consensus Statement on Inpatient Glycemic Control (2015)  Target Ranges:  Prepandial:   less than 140 mg/dL      Peak postprandial:   less than 180 mg/dL (1-2 hours)      Critically ill patients:  140 - 180 mg/dL   Lab Results  Component Value Date   GLUCAP 382 (H) 01/23/2024   HGBA1C 10.9 (H) 12/11/2023    Review of Glycemic Control  Latest Reference Range & Units 01/22/24 14:39 01/22/24 19:07 01/22/24 22:49 01/23/24 07:46 01/23/24 12:22  Glucose-Capillary 70 - 99 mg/dL 535 (H) 540 (H) 626 (H) 174 (H) 382 (H)  (H): Data is abnormally high  Diabetes history: DM2 Outpatient Diabetes medications: 70/30 30 units BID (not taking) Current orders for Inpatient glycemic control: Novolog  0-15 units TID   Inpatient Diabetes Program Recommendations:    Please consider:  Semglee  23 units every day (0.2 units x 115.7 kg) Novolog  4 units TID with meals if she consumes at least 50%  Thank you, Wyvonna Pinal, MSN, CDCES Diabetes Coordinator Inpatient Diabetes Program [phone] (team pager from 8a-5p)

## 2024-01-23 NOTE — ED Notes (Signed)
 [facility] calling regarding patient. To update CBG at [phone] option 2 after insulin .
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
## 2024-01-23 NOTE — ED Notes (Signed)
Pt belongings placed in [place]

## 2024-01-23 NOTE — ED Notes (Signed)
 Patient upset after talking with NP. Patient appears to be internally stimulated and is pacing in the room. PRN Zyprexa  given for agitation/anxiety.

## 2024-01-23 NOTE — ED Notes (Signed)
 Patient given cordless phone to call mother.

## 2024-01-23 NOTE — ED Notes (Signed)
 Case number [id number]

## 2024-01-23 NOTE — ED Notes (Signed)
Ivc current

## 2024-01-24 LAB — CBG MONITORING, ED
Glucose-Capillary: 110 mg/dL — ABNORMAL HIGH (ref 70–99)
Glucose-Capillary: 162 mg/dL — ABNORMAL HIGH (ref 70–99)
Glucose-Capillary: 249 mg/dL — ABNORMAL HIGH (ref 70–99)
Glucose-Capillary: 279 mg/dL — ABNORMAL HIGH (ref 70–99)
Glucose-Capillary: 286 mg/dL — ABNORMAL HIGH (ref 70–99)
Glucose-Capillary: 290 mg/dL — ABNORMAL HIGH (ref 70–99)
Glucose-Capillary: 294 mg/dL — ABNORMAL HIGH (ref 70–99)
Glucose-Capillary: 300 mg/dL — ABNORMAL HIGH (ref 70–99)

## 2024-01-24 MED ORDER — INSULIN ASPART 100 UNIT/ML IJ SOLN
10.0000 [IU] | Freq: Once | INTRAMUSCULAR | Status: AC
  Administered 2024-01-24: 10 [IU] via SUBCUTANEOUS

## 2024-01-24 MED ORDER — STERILE WATER FOR INJECTION IJ SOLN
INTRAMUSCULAR | Status: AC
  Administered 2024-01-24: 1.2 mL
  Filled 2024-01-24: qty 10

## 2024-01-24 MED ORDER — INSULIN ASPART 100 UNIT/ML IJ SOLN
4.0000 [IU] | Freq: Three times a day (TID) | INTRAMUSCULAR | Status: DC
  Administered 2024-01-24 – 2024-01-26 (×6): 4 [IU] via SUBCUTANEOUS

## 2024-01-24 MED ORDER — INSULIN GLARGINE-YFGN 100 UNIT/ML ~~LOC~~ SOLN
20.0000 [IU] | Freq: Every day | SUBCUTANEOUS | Status: DC
  Administered 2024-01-24: 20 [IU] via SUBCUTANEOUS
  Filled 2024-01-24 (×2): qty 0.2
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
## 2024-01-24 NOTE — ED Notes (Signed)
 Case Number: [id number] The patient was IVC'd on 01/22/2024 and the documents are set to expire on 01/29/2024.   3 copies of the IVC are located at the nurse's station in the blue zone.

## 2024-01-24 NOTE — ED Notes (Signed)
 Please update mother [phone]

## 2024-01-24 NOTE — ED Notes (Signed)
 Patient resting with eyes closed. Sitter in view of patient.

## 2024-01-24 NOTE — ED Notes (Signed)
 Assuming care of pt at this time

## 2024-01-24 NOTE — ED Notes (Signed)
Pt refused Vitals  

## 2024-01-24 NOTE — ED Provider Notes (Signed)
 Emergency Medicine Observation Re-evaluation Note  [name] is a 48 y.o. female, seen on rounds today.  Pt initially presented to the ED for complaints of Hyperglycemia Currently, the patient is resting.  Physical Exam  BP 128/76   Pulse 85   Temp 98.9 F (37.2 C)   Resp 17   Ht 5' 11 (1.803 m)   LMP  (LMP Unknown)   SpO2 100%   BMI 35.57 kg/m  Physical Exam General: nad  ED Course / MDM  EKG:EKG Interpretation Date/Time:  Tuesday January 22 2024 19:16:26 EDT Ventricular Rate:  120 PR Interval:  134 QRS Duration:  84 QT Interval:  332 QTC Calculation: 469 R Axis:   -12  Text Interpretation: Sinus tachycardia Moderate voltage criteria for LVH, may be normal variant ( R in aVL , Cornell product ) Septal infarct , age undetermined Abnormal ECG When compared with ECG of 22-Jan-2024 14:44, Since prior ECG, rate has increased Confirmed by Dreama Longs ([id number]) on 01/22/2024 9:41:04 PM  I have reviewed the labs performed to date as well as medications administered while in observation.  Recent changes in the last 24 hours include insulin  adjustment, plan for placement today.  Plan  Current plan is for placement.    Elnor Jayson LABOR, DO 01/24/24 [phone]

## 2024-01-24 NOTE — ED Notes (Signed)
 [name]- Mother Emergency Contact [phone]  Transferred to pt

## 2024-01-24 NOTE — ED Notes (Signed)
BG [phone]

## 2024-01-25 ENCOUNTER — Encounter (HOSPITAL_COMMUNITY): Payer: Self-pay | Admitting: Psychiatry

## 2024-01-25 DIAGNOSIS — F250 Schizoaffective disorder, bipolar type: Secondary | ICD-10-CM | POA: Diagnosis not present

## 2024-01-25 DIAGNOSIS — F22 Delusional disorders: Secondary | ICD-10-CM | POA: Diagnosis not present

## 2024-01-25 LAB — CBG MONITORING, ED
Glucose-Capillary: 181 mg/dL — ABNORMAL HIGH (ref 70–99)
Glucose-Capillary: 188 mg/dL — ABNORMAL HIGH (ref 70–99)
Glucose-Capillary: 217 mg/dL — ABNORMAL HIGH (ref 70–99)
Glucose-Capillary: 221 mg/dL — ABNORMAL HIGH (ref 70–99)
Glucose-Capillary: 334 mg/dL — ABNORMAL HIGH (ref 70–99)
Glucose-Capillary: 343 mg/dL — ABNORMAL HIGH (ref 70–99)

## 2024-01-25 MED ORDER — NOVOLIN 70/30 RELION (70-30) 100 UNIT/ML ~~LOC~~ SUSP: 50.0000 [IU] | Freq: Two times a day (BID) | SUBCUTANEOUS | 0 refills | Status: DC

## 2024-01-25 MED ORDER — INSULIN GLARGINE-YFGN 100 UNIT/ML ~~LOC~~ SOLN
40.0000 [IU] | Freq: Every day | SUBCUTANEOUS | Status: DC
  Administered 2024-01-25 – 2024-01-26 (×2): 40 [IU] via SUBCUTANEOUS
  Filled 2024-01-25 (×2): qty 0.4

## 2024-01-25 NOTE — ED Provider Notes (Addendum)
 Emergency Medicine Observation Re-evaluation Note  [name] is a 48 y.o. female, seen on rounds today.  Pt initially presented to the ED for complaints of Hyperglycemia Currently, the patient is resting.  Physical Exam  BP (!) 141/89 (BP Location: Right Arm)   Pulse 95   Temp 98.6 F (37 C) (Oral)   Resp 18   Ht 5' 11 (1.803 m)   LMP  (LMP Unknown)   SpO2 100%   BMI 35.57 kg/m  Physical Exam General: nad Cardiac good peripheral perfusion Pulm: bilateral chest rise Psych resting comfortably  ED Course / MDM  EKG:EKG Interpretation Date/Time:  Tuesday January 22 2024 19:16:26 EDT Ventricular Rate:  120 PR Interval:  134 QRS Duration:  84 QT Interval:  332 QTC Calculation: 469 R Axis:   -12  Text Interpretation: Sinus tachycardia Moderate voltage criteria for LVH, may be normal variant ( R in aVL , Cornell product ) Septal infarct , age undetermined Abnormal ECG When compared with ECG of 22-Jan-2024 14:44, Since prior ECG, rate has increased Confirmed by Dreama Longs ([id number]) on 01/22/2024 9:41:04 PM  I have reviewed the labs performed to date as well as medications administered while in observation.  Recent changes in the last 24 hours include insulin  adjustment, plan for placement today.  Plan  Current plan is for placement.   Patient has a bed, but blood sugars have been an issue. On notes patient seems to request frequent snacks usually high carbohydrate.    Seen by DM coordinator here.  Recommends increasing semglee  to 40U qam and 70/30 50 U bid.       Emil Share, DO 01/25/24 [phone]

## 2024-01-25 NOTE — ED Notes (Addendum)
 Devere, RN from [facility] was called at 1412 that patient will not  be coming until tomorrow, because of transportation.
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
## 2024-01-25 NOTE — Inpatient Diabetes Management (Incomplete)
 Inpatient Diabetes Program Recommendations  AACE/ADA: New Consensus Statement on Inpatient Glycemic Control (2015)  Target Ranges:  Prepandial:   less than 140 mg/dL      Peak postprandial:   less than 180 mg/dL (1-2 hours)      Critically ill patients:  140 - 180 mg/dL   Lab Results  Component Value Date   GLUCAP 334 (H) 01/25/2024   HGBA1C 10.9 (H) 12/11/2023    Review of Glycemic Control  Latest Reference Range & Units 01/24/24 07:46 01/24/24 10:26 01/24/24 12:43 01/24/24 13:28 01/24/24 14:45 01/24/24 17:49 01/24/24 20:13 01/25/24 00:08 01/25/24 07:41  Glucose-Capillary 70 - 99 mg/dL 699 (H) 705 (H) 713 (H) 279 (H) 249 (H) 110 (H) 162 (H) 217 (H) 334 (H)  (H): Data is abnormally high  Diabetes history: DM2 Outpatient Diabetes medications: 70/30 30 units BID (not taking) Current orders for Inpatient glycemic control: Semglee  20 units every day Novolog  0-15 units TID, Novolog  4 units TID  Met with patient at bedside.   She normally takes Novolin  ReliOn [employer] brand 70/30 -50 units BID.  She has been conserving her insulin  and has decreased to 30 units BID and then down to 20 units BID.  She has been out of insulin  since April.  She has Medicaid and should not pay more that 4$ for her insulin .  She has plenty of pen needles and glucometer and supplies.  Reviewed patient's current A1c of 10.9%. Explained what a A1c is and what it measures. Also reviewed goal A1c with patient, importance of good glucose control @ home, and blood sugar goals.  Encouraged her to take her insulin  as prescribed and obtain insulin  to avoid long and short term complications.     Please consider Semglee  40 units QAM.  Per RN she is not eating consistently so I am hesitant to ask for 70/30 now.    At Discharge, please consider-70/30 [employer] brand 50 units BID.  Thank you, Wyvonna Pinal, MSN, CDCES Diabetes Coordinator Inpatient Diabetes Program [phone] (team pager from 8a-5p)

## 2024-01-25 NOTE — ED Notes (Signed)
 Pt is constantly coming out of her room asking to use the phone and for snacks. Pt started to get loud asking for crackers, she states she did not eat all of her meals and need something to eat now. [place] sandwich and crackers given to pt.

## 2024-01-25 NOTE — ED Notes (Signed)
 IVC is current

## 2024-01-25 NOTE — ED Notes (Addendum)
 Pt is refusing CBG at this time. She said to come back later

## 2024-01-25 NOTE — ED Notes (Signed)
 Pt ambulated to the RR and back to her room

## 2024-01-26 LAB — CBG MONITORING, ED
Glucose-Capillary: 217 mg/dL — ABNORMAL HIGH (ref 70–99)
Glucose-Capillary: 280 mg/dL — ABNORMAL HIGH (ref 70–99)
Glucose-Capillary: 283 mg/dL — ABNORMAL HIGH (ref 70–99)
Glucose-Capillary: 212 mg/dL — ABNORMAL HIGH (ref 70–99)

## 2024-01-26 MED ORDER — FLUCONAZOLE 150 MG PO TABS
150.0000 mg | ORAL_TABLET | Freq: Once | ORAL | Status: AC
  Administered 2024-01-26: 150 mg via ORAL
  Filled 2024-01-26: qty 1

## 2024-01-26 MED ORDER — INSULIN ASPART 100 UNIT/ML IJ SOLN
4.0000 [IU] | Freq: Once | INTRAMUSCULAR | Status: AC
  Administered 2024-01-26: 4 [IU] via INTRAVENOUS

## 2024-01-26 NOTE — ED Notes (Signed)
Pt ambulated to bathroom and back

## 2024-01-26 NOTE — Discharge Instructions (Addendum)
 You were seen for your elevated blood sugar and mental health in the emergency department.   At home, please take the 70/30 insulin  50 units in the morning and at night.    Check your MyChart online for the results of any tests that had not resulted by the time you left the emergency department.   Follow-up with your primary doctor in 2-3 days regarding your visit.  Please talk to them about your blood sugars and make sure that you are recording them daily  Return immediately to the emergency department if you experience any of the following: Shortness of breath, severely elevated blood sugars, low blood sugar, or any other concerning symptoms.    Thank you for visiting our Emergency Department. It was a pleasure taking care of you today.

## 2024-01-26 NOTE — ED Provider Notes (Signed)
 Emergency Medicine Observation Re-evaluation Note  [name] is a 48 y.o. female, seen on rounds today.  Pt initially presented to the ED for complaints of Hyperglycemia Currently, the patient is resting comfortably.  Physical Exam  BP 130/70 (BP Location: Right Arm)   Pulse 80   Temp 97.9 F (36.6 C) (Oral)   Resp 15   Ht 5' 11 (1.803 m)   LMP  (LMP Unknown)   SpO2 99%   BMI 35.57 kg/m  Physical Exam General: Resting comfortably in stretcher Lungs: Normal work of breathing Psych: Calm  ED Course / MDM  EKG:EKG Interpretation Date/Time:  Tuesday January 22 2024 19:16:26 EDT Ventricular Rate:  120 PR Interval:  134 QRS Duration:  84 QT Interval:  332 QTC Calculation: 469 R Axis:   -12  Text Interpretation: Sinus tachycardia Moderate voltage criteria for LVH, may be normal variant ( R in aVL , Cornell product ) Septal infarct , age undetermined Abnormal ECG When compared with ECG of 22-Jan-2024 14:44, Since prior ECG, rate has increased Confirmed by Dreama Longs ([id number]) on 01/22/2024 9:41:04 PM  I have reviewed the labs performed to date as well as medications administered while in observation.  Recent changes in the last 24 hours include been working on blood sugar control.  Seen by diabetes educator.  Accepted to [facility].  Plan  Current plan is for transfer to [facility]. Will resume 70/30 insulin  50 units twice daily per diabetes educator recommendations   Yolande Lamar BROCKS, MD 01/26/24 1133

## 2024-01-26 NOTE — ED Notes (Signed)
[facility] CURRENT

## 2024-01-26 NOTE — ED Notes (Signed)
 Case Number: [id number]  Copies verified in the purple zone.   Discussed transportation details to [facility] with RN on duty.   The patient is set to be transferred and transported to [facility], but there is no ETA at this time.

## 2024-01-26 NOTE — ED Notes (Signed)
 Case Number: [id number]  Sheriff's Department called to schedule transport to [facility] in [place], [place].  A voicemail was left for Officer Beverley Raddle with patient and destination information.

## 2024-01-26 NOTE — ED Notes (Signed)
 Sheriff will not be here to transport pt back to [place] til the AM, will inform oncoming NSMT to call during day shift

## 2024-01-26 NOTE — ED Provider Notes (Signed)
 Patient having persistently elevated blood sugars.  Currently is 280.  Will give 4 units of fast acting insulin .  Will also give her fluconazole  because she is complaining of some vaginal itching which she believes is due to her elevated blood sugars.   Alyssa Lamar BROCKS, MD 01/26/24 [phone]

## 2024-02-06 ENCOUNTER — Telehealth: Payer: Self-pay

## 2024-02-06 NOTE — Telephone Encounter (Signed)
 Communication  Pt is requesting to transfer FROM: Dr. Benton Gave    Pt is requesting to transfer TO: Charlies DELENA Bellini, DO    Reason for requested transfer: Establish New care    It is the responsibility of the team the patient would like to transfer to (Dr. Bellini) to reach out to the patient if for any reason this transfer is not acceptable.    Fyi.

## 2024-02-07 NOTE — Telephone Encounter (Signed)
 Reason for CRM: Patient was under. Dr.Crain and would like to establish new care. Patient would like to have a form filled out for medical assistant at nurse home care. Patient is stated to be eligible with Medicaid. And a medical transport to get back and forth with the appointment. Patient is currently in the hospital. Patient will need to have a phone call next week because she is in the hospital- mental facility.  [phone] (M)   Attempted to contact patient regarding CRM above. Patient voicemail not set up.  Dr. Catherine is unable to fill out any forms unless patient is established with Dr. Catherine.   If patient calls back, please have her reach out to [place], [place]. She is patient's PCP until transfer of care appt on 11/5.   [facility] at [facility], [place] [address] [address], [place], [place] [address] [phone]

## 2024-02-07 NOTE — Telephone Encounter (Signed)
 Recommend patient remain with Benton Gave, PA. Patient has had a few new PCPs over short course of time.  She would be best suited to stay with someone who she is already recently established. Benton Gave can still be her PCP, she has just moved locations to [place]

## 2024-03-19 ENCOUNTER — Other Ambulatory Visit: Payer: Self-pay | Admitting: Urgent Care

## 2024-03-19 NOTE — Telephone Encounter (Unsigned)
 Copied from CRM [id number]. Topic: Clinical - Medication Refill >> Mar 19, 2024  2:37 PM Merlynn A wrote: Medication: insulin  NPH-regular Human (NOVOLIN  70/30 RELION) (70-30) 100 UNIT/ML injection  Has the patient contacted their pharmacy? No (Agent: If no, request that the patient contact the pharmacy for the refill. If patient does not wish to contact the pharmacy document the reason why and proceed with request.) (Agent: If yes, when and what did the pharmacy advise?)  This is the patient's preferred pharmacy:  [facility] [address] [place] [address] [phone]  Is this the correct pharmacy for this prescription? Yes If no, delete pharmacy and type the correct one.   Has the prescription been filled recently? No  Is the patient out of the medication? No  Has the patient been seen for an appointment in the last year OR does the patient have an upcoming appointment? No  Can we respond through MyChart? Yes  Agent: Please be advised that Rx refills may take up to 3 business days. We ask that you follow-up with your pharmacy.

## 2024-03-20 MED ORDER — NOVOLIN 70/30 RELION (70-30) 100 UNIT/ML ~~LOC~~ SUSP: 50.0000 [IU] | Freq: Two times a day (BID) | SUBCUTANEOUS | 0 refills | Status: DC

## 2024-05-07 ENCOUNTER — Encounter: Admitting: Family Medicine

## 2024-05-07 ENCOUNTER — Inpatient Hospital Stay (HOSPITAL_COMMUNITY)
Admission: EM | Admit: 2024-05-07 | Discharge: 2024-05-08 | DRG: 639 | Discharge status: Against Medical Advice | Attending: Internal Medicine | Admitting: Internal Medicine

## 2024-05-07 ENCOUNTER — Other Ambulatory Visit: Payer: Self-pay

## 2024-05-07 DIAGNOSIS — E1010 Type 1 diabetes mellitus with ketoacidosis without coma: Principal | ICD-10-CM | POA: Diagnosis present

## 2024-05-07 DIAGNOSIS — Z87891 Personal history of nicotine dependence: Secondary | ICD-10-CM

## 2024-05-07 DIAGNOSIS — Z91120 Patient's intentional underdosing of medication regimen due to financial hardship: Secondary | ICD-10-CM

## 2024-05-07 DIAGNOSIS — E1142 Type 2 diabetes mellitus with diabetic polyneuropathy: Secondary | ICD-10-CM

## 2024-05-07 DIAGNOSIS — Z5329 Procedure and treatment not carried out because of patient's decision for other reasons: Secondary | ICD-10-CM | POA: Diagnosis not present

## 2024-05-07 DIAGNOSIS — Z833 Family history of diabetes mellitus: Secondary | ICD-10-CM | POA: Diagnosis not present

## 2024-05-07 DIAGNOSIS — K580 Irritable bowel syndrome with diarrhea: Secondary | ICD-10-CM | POA: Diagnosis present

## 2024-05-07 DIAGNOSIS — T383X6A Underdosing of insulin and oral hypoglycemic [antidiabetic] drugs, initial encounter: Secondary | ICD-10-CM | POA: Diagnosis present

## 2024-05-07 DIAGNOSIS — E119 Type 2 diabetes mellitus without complications: Secondary | ICD-10-CM

## 2024-05-07 DIAGNOSIS — F1220 Cannabis dependence, uncomplicated: Secondary | ICD-10-CM | POA: Diagnosis present

## 2024-05-07 DIAGNOSIS — F250 Schizoaffective disorder, bipolar type: Secondary | ICD-10-CM | POA: Diagnosis present

## 2024-05-07 DIAGNOSIS — Z794 Long term (current) use of insulin: Secondary | ICD-10-CM

## 2024-05-07 DIAGNOSIS — E1110 Type 2 diabetes mellitus with ketoacidosis without coma: Secondary | ICD-10-CM | POA: Diagnosis present

## 2024-05-07 DIAGNOSIS — Z59868 Other specified financial insecurity: Secondary | ICD-10-CM | POA: Diagnosis not present

## 2024-05-07 DIAGNOSIS — E875 Hyperkalemia: Secondary | ICD-10-CM | POA: Diagnosis present

## 2024-05-07 DIAGNOSIS — I10 Essential (primary) hypertension: Secondary | ICD-10-CM | POA: Diagnosis present

## 2024-05-07 LAB — CBC
HCT: 42.4 % (ref 36.0–46.0)
Hemoglobin: 12.8 g/dL (ref 12.0–15.0)
MCH: 25 pg — ABNORMAL LOW (ref 26.0–34.0)
MCHC: 30.2 g/dL (ref 30.0–36.0)
MCV: 82.8 fL (ref 80.0–100.0)
Platelets: 446 K/uL — ABNORMAL HIGH (ref 150–400)
RBC: 5.12 MIL/uL — ABNORMAL HIGH (ref 3.87–5.11)
RDW: 15.5 % (ref 11.5–15.5)
WBC: 17.9 K/uL — ABNORMAL HIGH (ref 4.0–10.5)
nRBC: 0 % (ref 0.0–0.2)

## 2024-05-07 LAB — BASIC METABOLIC PANEL WITH GFR
Anion gap: 17 — ABNORMAL HIGH (ref 5–15)
BUN: 17 mg/dL (ref 6–20)
CO2: 19 mmol/L — ABNORMAL LOW (ref 22–32)
Calcium: 9.9 mg/dL (ref 8.9–10.3)
Chloride: 103 mmol/L (ref 98–111)
Creatinine, Ser: 0.97 mg/dL (ref 0.44–1.00)
GFR, Estimated: >60 mL/min (ref >60)
Glucose, Bld: 393 mg/dL — ABNORMAL HIGH (ref 70–99)
Potassium: 3.9 mmol/L (ref 3.5–5.1)
Sodium: 138 mmol/L (ref 135–145)

## 2024-05-07 LAB — URINALYSIS, ROUTINE W REFLEX MICROSCOPIC
Bilirubin Urine: NEGATIVE
Glucose, UA: >=500 mg/dL — AB
Ketones, ur: 20 mg/dL — AB
Leukocytes,Ua: NEGATIVE
Nitrite: NEGATIVE
Protein, ur: 30 mg/dL — AB
RBC / HPF: >50 RBC/hpf (ref 0–5)
Specific Gravity, Urine: 1.031 — ABNORMAL HIGH (ref 1.005–1.030)
pH: 6 (ref 5.0–8.0)

## 2024-05-07 LAB — COMPREHENSIVE METABOLIC PANEL WITH GFR
ALT: 24 U/L (ref 0–44)
AST: 18 U/L (ref 15–41)
Albumin: 4.6 g/dL (ref 3.5–5.0)
Alkaline Phosphatase: 117 U/L (ref 38–126)
Anion gap: 24 — ABNORMAL HIGH (ref 5–15)
BUN: 19 mg/dL (ref 6–20)
CO2: 14 mmol/L — ABNORMAL LOW (ref 22–32)
Calcium: 10.7 mg/dL — ABNORMAL HIGH (ref 8.9–10.3)
Chloride: 96 mmol/L — ABNORMAL LOW (ref 98–111)
Creatinine, Ser: 1.04 mg/dL — ABNORMAL HIGH (ref 0.44–1.00)
GFR, Estimated: >60 mL/min (ref >60)
Glucose, Bld: 719 mg/dL (ref 70–99)
Potassium: 5 mmol/L (ref 3.5–5.1)
Sodium: 134 mmol/L — ABNORMAL LOW (ref 135–145)
Total Bilirubin: 1.1 mg/dL (ref 0.0–1.2)
Total Protein: 8.7 g/dL — ABNORMAL HIGH (ref 6.5–8.1)

## 2024-05-07 LAB — CBG MONITORING, ED
Glucose-Capillary: >600 mg/dL (ref 70–99)
Glucose-Capillary: 594 mg/dL (ref 70–99)
Glucose-Capillary: 487 mg/dL — ABNORMAL HIGH (ref 70–99)
Glucose-Capillary: 563 mg/dL (ref 70–99)
Glucose-Capillary: 419 mg/dL — ABNORMAL HIGH (ref 70–99)
Glucose-Capillary: 445 mg/dL — ABNORMAL HIGH (ref 70–99)
Glucose-Capillary: 393 mg/dL — ABNORMAL HIGH (ref 70–99)
Glucose-Capillary: 264 mg/dL — ABNORMAL HIGH (ref 70–99)

## 2024-05-07 LAB — I-STAT CHEM 8, ED
BUN: 24 mg/dL — ABNORMAL HIGH (ref 6–20)
Calcium, Ion: 1.09 mmol/L — ABNORMAL LOW (ref 1.15–1.40)
Chloride: 105 mmol/L (ref 98–111)
Creatinine, Ser: 0.8 mg/dL (ref 0.44–1.00)
Glucose, Bld: 691 mg/dL (ref 70–99)
HCT: 43 % (ref 36.0–46.0)
Hemoglobin: 14.6 g/dL (ref 12.0–15.0)
Potassium: 5.7 mmol/L — ABNORMAL HIGH (ref 3.5–5.1)
Sodium: 132 mmol/L — ABNORMAL LOW (ref 135–145)
TCO2: 16 mmol/L — ABNORMAL LOW (ref 22–32)

## 2024-05-07 LAB — BLOOD GAS, VENOUS
Acid-base deficit: 8.2 mmol/L — ABNORMAL HIGH (ref 0.0–2.0)
Bicarbonate: 17.1 mmol/L — ABNORMAL LOW (ref 20.0–28.0)
O2 Saturation: 52.7 %
Patient temperature: 37
pCO2, Ven: 34 mmHg — ABNORMAL LOW (ref 44–60)
pH, Ven: 7.31 (ref 7.25–7.43)
pO2, Ven: 34 mmHg (ref 32–45)

## 2024-05-07 LAB — HCG, SERUM, QUALITATIVE: Preg, Serum: NEGATIVE

## 2024-05-07 LAB — TROPONIN T, HIGH SENSITIVITY
Troponin T High Sensitivity: <15 ng/L (ref 0–19)
Troponin T High Sensitivity: <15 ng/L (ref 0–19)

## 2024-05-07 LAB — GLUCOSE, CAPILLARY
Glucose-Capillary: 231 mg/dL — ABNORMAL HIGH (ref 70–99)
Glucose-Capillary: 235 mg/dL — ABNORMAL HIGH (ref 70–99)

## 2024-05-07 LAB — BETA-HYDROXYBUTYRIC ACID
Beta-Hydroxybutyric Acid: 3.83 mmol/L — ABNORMAL HIGH (ref 0.05–0.27)
Beta-Hydroxybutyric Acid: 1.14 mmol/L — ABNORMAL HIGH (ref 0.05–0.27)

## 2024-05-07 MED ORDER — ENOXAPARIN SODIUM 40 MG/0.4ML IJ SOSY
40.0000 mg | PREFILLED_SYRINGE | INTRAMUSCULAR | Status: DC
  Administered 2024-05-07: 40 mg via SUBCUTANEOUS
  Filled 2024-05-07: qty 0.4

## 2024-05-07 MED ORDER — LACTATED RINGERS IV BOLUS: 20.0000 mL/kg | Freq: Once | INTRAVENOUS | Status: DC

## 2024-05-07 MED ORDER — DEXTROSE IN LACTATED RINGERS 5 % IV SOLN: INTRAVENOUS | Status: DC

## 2024-05-07 MED ORDER — CHLORHEXIDINE GLUCONATE CLOTH 2 % EX PADS: 6.0000 | MEDICATED_PAD | Freq: Every day | CUTANEOUS | Status: DC

## 2024-05-07 MED ORDER — LACTATED RINGERS IV SOLN: INTRAVENOUS | Status: DC

## 2024-05-07 MED ORDER — LACTATED RINGERS IV BOLUS
2000.0000 mL | Freq: Once | INTRAVENOUS | Status: AC
  Administered 2024-05-07: 2000 mL via INTRAVENOUS

## 2024-05-07 MED ORDER — METOCLOPRAMIDE HCL 5 MG/ML IJ SOLN
10.0000 mg | Freq: Once | INTRAMUSCULAR | Status: AC
  Administered 2024-05-07: 10 mg via INTRAVENOUS
  Filled 2024-05-07: qty 2

## 2024-05-07 MED ORDER — DIPHENHYDRAMINE HCL 50 MG/ML IJ SOLN
25.0000 mg | Freq: Once | INTRAMUSCULAR | Status: AC
  Administered 2024-05-07: 25 mg via INTRAVENOUS
  Filled 2024-05-07: qty 1

## 2024-05-07 MED ORDER — LACTATED RINGERS IV BOLUS
1000.0000 mL | Freq: Once | INTRAVENOUS | Status: AC
  Administered 2024-05-07: 1000 mL via INTRAVENOUS

## 2024-05-07 MED ORDER — DEXTROSE 50 % IV SOLN: 0.0000 mL | INTRAVENOUS | Status: DC | PRN

## 2024-05-07 MED ORDER — ONDANSETRON HCL 4 MG/2ML IJ SOLN
4.0000 mg | Freq: Three times a day (TID) | INTRAMUSCULAR | Status: DC | PRN
  Administered 2024-05-07 – 2024-05-08 (×2): 4 mg via INTRAVENOUS
  Filled 2024-05-07 (×2): qty 2

## 2024-05-07 MED ORDER — LABETALOL HCL 5 MG/ML IV SOLN
10.0000 mg | INTRAVENOUS | Status: DC | PRN
  Administered 2024-05-07 – 2024-05-08 (×2): 10 mg via INTRAVENOUS
  Filled 2024-05-07 (×2): qty 4

## 2024-05-07 MED ORDER — PROCHLORPERAZINE EDISYLATE 10 MG/2ML IJ SOLN
10.0000 mg | Freq: Four times a day (QID) | INTRAMUSCULAR | Status: DC | PRN
  Administered 2024-05-07: 10 mg via INTRAVENOUS
  Filled 2024-05-07: qty 2

## 2024-05-07 MED ORDER — SODIUM CHLORIDE 0.9 % IV SOLN
Freq: Once | INTRAVENOUS | Status: DC
Start: 2024-05-07 — End: 2024-05-08

## 2024-05-07 MED ORDER — ACETAMINOPHEN 325 MG PO TABS: 650.0000 mg | ORAL_TABLET | Freq: Four times a day (QID) | ORAL | Status: DC | PRN

## 2024-05-07 MED ORDER — INSULIN REGULAR(HUMAN) IN NACL 100-0.9 UT/100ML-% IV SOLN
INTRAVENOUS | Status: DC
  Administered 2024-05-07: 19 [IU]/h via INTRAVENOUS
  Administered 2024-05-08: 8 [IU]/h via INTRAVENOUS
  Filled 2024-05-07 (×2): qty 100

## 2024-05-07 NOTE — ED Provider Notes (Signed)
  EMERGENCY DEPARTMENT AT [facility] Provider Note   CSN: [id number] Arrival date & time: 05/07/24  1538     Patient presents with: Hyperglycemia   [name] is a 48 y.o. female.   48 yo F with a chief complaint of high blood sugar.  Patient has run out of her insulin  and has not been able to afford a refill.  She has been very hungry and thirsty.  Has had some nausea and vomiting.  She denies any fevers.  Denies cough or congestion.  Denies chest pain or pressure.   Hyperglycemia      Prior to Admission medications   Medication Sig Start Date End Date Taking? Authorizing Provider  benztropine  (COGENTIN ) 1 MG tablet Take 1 tablet (1 mg total) by mouth 2 (two) times daily. Patient taking differently: Take 1 mg by mouth at bedtime. 12/21/22   Tanda Bleacher, MD  CLARITIN 10 MG tablet Take 10 mg by mouth daily as needed for allergies, rhinitis or itching.    [provider]  ferrous sulfate  325 (65 FE) MG tablet Take 1 tablet (325 mg total) by mouth daily with breakfast. 02/16/23   Tanda Bleacher, MD  haloperidol  (HALDOL ) 5 MG tablet Take 1 tablet (5 mg total) by mouth 2 (two) times daily. Patient taking differently: Take 5-10 mg by mouth at bedtime. 12/13/23   Ghimire, Donalda HERO, MD  haloperidol  decanoate (HALDOL  DECANOATE) 100 MG/ML injection Inject 100 mg into the muscle every 28 (twenty-eight) days. 10/19/23   [provider]  insulin  NPH-regular Human (NOVOLIN  70/30 RELION) (70-30) 100 UNIT/ML injection Inject 50 Units into the skin in the morning and at bedtime. 03/20/24   Crain, Whitney L, PA  Insulin  Pen Needle (PEN NEEDLES) 31G X 5 MM MISC 1 each by Does not apply route daily. 10/02/23   Crain, Whitney L, PA  Lancet Device MISC 1 each by Does not apply route 3 (three) times daily. May dispense any manufacturer covered by patient's insurance. Patient not taking: Reported on 01/24/2024 12/21/22   Tanda Bleacher, MD  lisinopril -hydrochlorothiazide   (ZESTORETIC ) 20-25 MG tablet Take 1 tablet by mouth daily.    [provider]  PARoxetine  (PAXIL ) 20 MG tablet Take 1 tablet (20 mg total) by mouth daily. Patient taking differently: Take 20 mg by mouth at bedtime. 12/21/22   Tanda Bleacher, MD  thiamine  (VITAMIN B-1) 100 MG tablet Take 1 tablet (100 mg total) by mouth daily. Patient not taking: Reported on 01/22/2024 12/14/23   Raenelle Donalda HERO, MD  traZODone  (DESYREL ) 50 MG tablet Take 50 mg by mouth at bedtime as needed for sleep.    [provider]  VITAMIN E PO Take 1 capsule by mouth daily.    [provider]    Allergies: Patient has no known allergies.    Review of Systems  Updated Vital Signs BP (!) 171/115   Pulse (!) 101   Temp 98.2 F (36.8 C) (Oral)   Resp 20   SpO2 100%   Physical Exam Vitals and nursing note reviewed.  Constitutional:      General: She is not in acute distress.    Appearance: She is well-developed. She is not diaphoretic.  HENT:     Head: Normocephalic and atraumatic.  Eyes:     Pupils: Pupils are equal, round, and reactive to light.  Cardiovascular:     Rate and Rhythm: Normal rate and regular rhythm.     Heart sounds: No murmur heard.  No friction rub. No gallop.  Pulmonary:     Effort: Pulmonary effort is normal.     Breath sounds: No wheezing or rales.     Comments: Tachypnea Abdominal:     General: There is no distension.     Palpations: Abdomen is soft.     Tenderness: There is no abdominal tenderness.  Musculoskeletal:        General: No tenderness.     Cervical back: Normal range of motion and neck supple.  Skin:    General: Skin is warm and dry.  Neurological:     Mental Status: She is alert and oriented to person, place, and time.  Psychiatric:        Behavior: Behavior normal.     (all labs ordered are listed, but only abnormal results are displayed) Labs Reviewed  COMPREHENSIVE METABOLIC PANEL WITH GFR - Abnormal; Notable for the following  components:      Result Value   Sodium 134 (*)    Chloride 96 (*)    CO2 14 (*)    Glucose, Bld 719 (*)    Creatinine, Ser 1.04 (*)    Calcium  10.7 (*)    Total Protein 8.7 (*)    Anion gap 24 (*)    All other components within normal limits  CBC - Abnormal; Notable for the following components:   WBC 17.9 (*)    RBC 5.12 (*)    MCH 25.0 (*)    Platelets 446 (*)    All other components within normal limits  BLOOD GAS, VENOUS - Abnormal; Notable for the following components:   pCO2, Ven 34 (*)    Bicarbonate 17.1 (*)    Acid-base deficit 8.2 (*)    All other components within normal limits  CBG MONITORING, ED - Abnormal; Notable for the following components:   Glucose-Capillary >600 (*)    All other components within normal limits  I-STAT CHEM 8, ED - Abnormal; Notable for the following components:   Sodium 132 (*)    Potassium 5.7 (*)    BUN 24 (*)    Glucose, Bld 691 (*)    Calcium , Ion 1.09 (*)    TCO2 16 (*)    All other components within normal limits  CBG MONITORING, ED - Abnormal; Notable for the following components:   Glucose-Capillary 594 (*)    All other components within normal limits  URINALYSIS, ROUTINE W REFLEX MICROSCOPIC  HCG, SERUM, QUALITATIVE  BETA-HYDROXYBUTYRIC ACID  BASIC METABOLIC PANEL WITH GFR  BASIC METABOLIC PANEL WITH GFR  BASIC METABOLIC PANEL WITH GFR  BETA-HYDROXYBUTYRIC ACID  BETA-HYDROXYBUTYRIC ACID  BETA-HYDROXYBUTYRIC ACID  TROPONIN T, HIGH SENSITIVITY    EKG: None  Radiology: No results found.   .Critical Care  Performed by: Emil Share, DO Authorized by: Emil Share, DO   Critical care provider statement:    Critical care time (minutes):  35   Critical care time was exclusive of:  Separately billable procedures and treating other patients   Critical care was time spent personally by me on the following activities:  Development of treatment plan with patient or surrogate, discussions with consultants, evaluation of  patient's response to treatment, examination of patient, ordering and review of laboratory studies, ordering and review of radiographic studies, ordering and performing treatments and interventions, pulse oximetry, re-evaluation of patient's condition and review of old charts   Care discussed with: admitting provider      Medications Ordered in the ED  0.9 %  sodium  chloride infusion (0 mLs Intravenous Hold 05/07/24 1722)  lactated ringers  bolus 20 mL/kg (has no administration in time range)  insulin  regular, human (MYXREDLIN ) 100 units/ 100 mL infusion (19 Units/hr Intravenous New Bag/Given 05/07/24 1720)  lactated ringers  infusion ( Intravenous New Bag/Given 05/07/24 1722)  dextrose  5 % in lactated ringers  infusion (has no administration in time range)  dextrose  50 % solution 0-50 mL (has no administration in time range)  lactated ringers  bolus 2,000 mL (2,000 mLs Intravenous New Bag/Given 05/07/24 1720)  metoCLOPramide  (REGLAN ) injection 10 mg (10 mg Intravenous Given 05/07/24 1722)  diphenhydrAMINE  (BENADRYL ) injection 25 mg (25 mg Intravenous Given 05/07/24 1721)                                    Medical Decision Making Amount and/or Complexity of Data Reviewed Labs: ordered.  Risk Prescription drug management.   48 yo F with a chief complaints of hyperglycemia.  Patient has not been able to afford her insulin .  She is having polyphagia polydipsia polyuria.  Initial i-STAT Chem-8 with blood sugar of 700, bicarb 16.  Will start on insulin  infusion.  Formal chemistry panel confirms, patient's likely DKA with a blood sugar of 719 and bicarb of 14 and anion gap of 24.  pH 7.3.  Will discuss with medicine for admission.  The patients results and plan were reviewed and discussed.   Any x-rays performed were independently reviewed by myself.   Differential diagnosis were considered with the presenting HPI.  Medications  0.9 %  sodium chloride  infusion (0 mLs Intravenous Hold 05/07/24  1722)  lactated ringers  bolus 20 mL/kg (has no administration in time range)  insulin  regular, human (MYXREDLIN ) 100 units/ 100 mL infusion (19 Units/hr Intravenous New Bag/Given 05/07/24 1720)  lactated ringers  infusion ( Intravenous New Bag/Given 05/07/24 1722)  dextrose  5 % in lactated ringers  infusion (has no administration in time range)  dextrose  50 % solution 0-50 mL (has no administration in time range)  lactated ringers  bolus 2,000 mL (2,000 mLs Intravenous New Bag/Given 05/07/24 1720)  metoCLOPramide  (REGLAN ) injection 10 mg (10 mg Intravenous Given 05/07/24 1722)  diphenhydrAMINE  (BENADRYL ) injection 25 mg (25 mg Intravenous Given 05/07/24 1721)    Vitals:   05/07/24 1614  BP: (!) 171/115  Pulse: (!) 101  Resp: 20  Temp: 98.2 F (36.8 C)  TempSrc: Oral  SpO2: 100%    Final diagnoses:  Diabetic ketoacidosis without coma associated with type 2 diabetes mellitus (HCC)    Admission/ observation were discussed with the admitting physician, patient and/or family and they are comfortable with the plan.       Final diagnoses:  Diabetic ketoacidosis without coma associated with type 2 diabetes mellitus [facility])    ED Discharge Orders     None          Emil Share, DO 05/07/24 1737

## 2024-05-07 NOTE — H&P (Signed)
 History and Physical    [name] FMW:[id number] DOB: 1975/11/29 DOA: 05/07/2024  PCP: Francyne Headland, MD   Patient coming from: Home  I have personally briefly reviewed patient's old medical records in [facility]  Chief Complaint: Vomiting, diarrhea  HPI: [name] is a 48 y.o. female with medical history significant for bipolar disorder, diabetes mellitus, depression.  [facility] [place] [facility] [place] [facility] [place] for reports of nausea vomiting diarrhea of 2 days duration.  Patient denies chest pain.  She was in the court house for a court case.  She reports multiple episodes of vomiting yesterday, 1 episode of diarrhea.  No abdominal pain.  No fevers no chills.  She is supposed to be on insulin - ?? Type, or dose, she has not taken her insulin  in several weeks, because she was unable to afford it.  ED Course: Temperature 98.2.  Heart rate 101, respiratory 20.  Blood pressure 171/115.  O2 sats 100% on room air.  WBC 17.9.  Troponin less than 15.  CBG 719, low serum bicarb at 14, anion gap of 24.  VBG shows pH of 7.3.  Beta-hydroxybutyrate acid of 3.83 Insulin  drip started.  2 L bolus given. Hospitalist to admit for DKA.  Review of Systems: As per HPI all other systems reviewed and negative.  Past Medical History:  Diagnosis Date   Abnormal Pap smear    Bipolar 1 disorder (HCC)    Diabetes mellitus type I (HCC)    Diabetes mellitus without complication (HCC)    Fibroids    Hypertension    IBS (irritable bowel syndrome)     Past Surgical History:  Procedure Laterality Date   CERVICAL BIOPSY     CHOLECYSTECTOMY     ESSURE TUBAL LIGATION     HIATAL HERNIA REPAIR     TUBAL LIGATION       reports that she has quit smoking. Her smoking use included cigarettes. She has never used smokeless tobacco. She reports current alcohol use. She reports current drug use. Drugs: Cocaine and Methamphetamines.  No Known Allergies  Family History  Problem Relation Age of  Onset   Diabetes Father    Diabetes Paternal Grandmother    Diabetes Maternal Grandmother    Mental illness Cousin    Healthy Mother     Prior to Admission medications   Medication Sig Start Date End Date Taking? Authorizing Provider  benztropine  (COGENTIN ) 1 MG tablet Take 1 tablet (1 mg total) by mouth 2 (two) times daily. Patient taking differently: Take 1 mg by mouth at bedtime. 12/21/22   Tanda Bleacher, MD  CLARITIN 10 MG tablet Take 10 mg by mouth daily as needed for allergies, rhinitis or itching.    [provider]  ferrous sulfate  325 (65 FE) MG tablet Take 1 tablet (325 mg total) by mouth daily with breakfast. 02/16/23   Tanda Bleacher, MD  haloperidol  (HALDOL ) 5 MG tablet Take 1 tablet (5 mg total) by mouth 2 (two) times daily. Patient taking differently: Take 5-10 mg by mouth at bedtime. 12/13/23   Ghimire, Donalda HERO, MD  haloperidol  decanoate (HALDOL  DECANOATE) 100 MG/ML injection Inject 100 mg into the muscle every 28 (twenty-eight) days. 10/19/23   [provider]  insulin  NPH-regular Human (NOVOLIN  70/30 RELION) (70-30) 100 UNIT/ML injection Inject 50 Units into the skin in the morning and at bedtime. 03/20/24   Crain, Whitney L, PA  Insulin  Pen Needle (PEN NEEDLES) 31G X 5 MM MISC 1 each  by Does not apply route daily. 10/02/23   Crain, Whitney L, PA  Lancet Device MISC 1 each by Does not apply route 3 (three) times daily. May dispense any manufacturer covered by patient's insurance. Patient not taking: Reported on 01/24/2024 12/21/22   Tanda Bleacher, MD  lisinopril -hydrochlorothiazide  (ZESTORETIC ) 20-25 MG tablet Take 1 tablet by mouth daily.    [provider]  PARoxetine  (PAXIL ) 20 MG tablet Take 1 tablet (20 mg total) by mouth daily. Patient taking differently: Take 20 mg by mouth at bedtime. 12/21/22   Tanda Bleacher, MD  thiamine  (VITAMIN B-1) 100 MG tablet Take 1 tablet (100 mg total) by mouth daily. Patient not taking: Reported on 01/22/2024 12/14/23    Raenelle Donalda HERO, MD  traZODone  (DESYREL ) 50 MG tablet Take 50 mg by mouth at bedtime as needed for sleep.    [provider]  VITAMIN E PO Take 1 capsule by mouth daily.    [provider]    Physical Exam: Vitals:   05/07/24 1614 05/07/24 1730  BP: (!) 171/115   Pulse: (!) 101   Resp: 20   Temp: 98.2 F (36.8 C)   TempSrc: Oral   SpO2: 100%   Weight:  115.7 kg  Height:  5' 11 (1.803 m)    Constitutional: NAD, calm, comfortable Vitals:   05/07/24 1614 05/07/24 1730  BP: (!) 171/115   Pulse: (!) 101   Resp: 20   Temp: 98.2 F (36.8 C)   TempSrc: Oral   SpO2: 100%   Weight:  115.7 kg  Height:  5' 11 (1.803 m)   Eyes: PERRL, lids and conjunctivae normal ENMT: Mucous membranes are dry  Neck: normal, supple, no masses, no thyromegaly Respiratory: clear to auscultation bilaterally, no wheezing, no crackles. Normal respiratory effort. No accessory muscle use.  Cardiovascular: Regular rate and rhythm, no murmurs / rubs / gallops. No extremity edema.  Abdomen: no tenderness, no masses palpated. No hepatosplenomegaly. Bowel sounds positive.  Musculoskeletal: no clubbing / cyanosis. No joint deformity upper and lower extremities.  Skin: no rashes, lesions, ulcers. No induration Neurologic: No facial asymmetry, moves extremities spontaneously, speech fluent .  Psychiatric: Normal judgment and insight. Alert and oriented x 3. Normal mood.   Labs on Admission: I have personally reviewed following labs and imaging studies  CBC: Recent Labs  Lab 05/07/24 1630 05/07/24 1639  WBC 17.9*  --   HGB 12.8 14.6  HCT 42.4 43.0  MCV 82.8  --   PLT 446*  --    Basic Metabolic Panel: Recent Labs  Lab 05/07/24 1630 05/07/24 1639  NA 134* 132*  K 5.0 5.7*  CL 96* 105  CO2 14*  --   GLUCOSE 719* 691*  BUN 19 24*  CREATININE 1.04* 0.80  CALCIUM  10.7*  --    GFR: Estimated Creatinine Clearance: 123.2 mL/min (by C-G formula based on SCr of 0.8  mg/dL). Liver Function Tests: Recent Labs  Lab 05/07/24 1630  AST 18  ALT 24  ALKPHOS 117  BILITOT 1.1  PROT 8.7*  ALBUMIN 4.6   CBG: Recent Labs  Lab 05/07/24 1743 05/07/24 1820 05/07/24 1853 05/07/24 1926 05/07/24 2000  GLUCAP 563* 487* 445* 419* 393*   Urine analysis:    Component Value Date/Time   COLORURINE RED (A) 05/07/2024 1801   APPEARANCEUR CLOUDY (A) 05/07/2024 1801   LABSPEC 1.031 (H) 05/07/2024 1801   PHURINE 6.0 05/07/2024 1801   GLUCOSEU >=500 (A) 05/07/2024 1801   HGBUR LARGE (A) 05/07/2024  1801   BILIRUBINUR NEGATIVE 05/07/2024 1801   BILIRUBINUR negative 04/08/2021 1413   BILIRUBINUR neg 10/11/2015 1440   KETONESUR 20 (A) 05/07/2024 1801   PROTEINUR 30 (A) 05/07/2024 1801   UROBILINOGEN 0.2 04/08/2021 1413   UROBILINOGEN 0.2 09/15/2012 0000   NITRITE NEGATIVE 05/07/2024 1801   LEUKOCYTESUR NEGATIVE 05/07/2024 1801    Radiological Exams on Admission: No results found.  EKG: Independently reviewed.  Sinus rhythm, rate 98, QTc 423.  No significant ST or T wave changes from prior.  Assessment/Plan Principal Problem:   DKA (diabetic ketoacidosis) (HCC) Active Problems:   Cannabis use disorder, moderate, dependence (HCC)   Diabetes mellitus (HCC)   Schizoaffective disorder, bipolar type (HCC)   Essential hypertension   Assessment and Plan:  Diabetic ketoacidosis-glucose of 719, serum bicarb of 14, anion gap of 24, beta hydroxybutyrate acid-3.8.  VBG shows pH of 7.3.  Has not taken her insulin  in several weeks as she could not afford it.  Per MAR she is supposed to be on 70/30-  50 units twice daily. - Continue insulin  drip - 3L bolus given in total, continue LR 125cc/hr  - DKA protocol - BMP every 4 hourly - Diabetes coordinator consult  Hypertension-elevated.  Awaiting med reconciliation - As needed labetalol  10 mg for systolic greater than 170  Schizoaffective disorder- per med list on- benztropine , paroxetine , haloperidol  -Awaiting  med rec  DVT prophylaxis: Lovenox  Code Status: Full code Family Communication: None at bedside  disposition Plan: ~ 2 days Consults called: None Admission status: Inpt stepdown I certify that at the point of admission it is my clinical judgment that the patient will require inpatient hospital care spanning beyond 2 midnights from the point of admission due to high intensity of service, high risk for further deterioration and high frequency of surveillance required.   Author: Tully FORBES Carwin, MD 05/07/2024 8:35 PM  For on call review [email or web address].

## 2024-05-07 NOTE — ED Notes (Signed)
 I stat Chem 8 was given to MD. Emil triage Rn.  GLu 691

## 2024-05-07 NOTE — ED Notes (Signed)
 Took pt's blood sugar, glucometer read high. RN Jodie aware

## 2024-05-07 NOTE — ED Triage Notes (Signed)
 [facility] [place] [facility] [place] [facility] [place] but when EMS got there she states she has been having N/V/D x 2 days with LUQ pain. Patient vomited on arrival to triage.    CBG High per EMS, not taking her meds per EMS 179/96 99% RA 94

## 2024-05-07 NOTE — ED Provider Triage Note (Signed)
 Emergency Medicine Provider Triage Evaluation Note  [name] , a 48 y.o. female  was evaluated in triage.  Pt complains of nausea, vomiting, diarrhea for the past 2 days, non-compliance with diabetic medications. Patient states she is very thirsty and is experiencing blurry vision. EMS was initially dispatched due to chest pain. Patient denies chest pain currently. Patient coherent but slightly altered in triage, generalized weakness.  Review of Systems  Positive: Nausea, vomiting, diarrhea, elevated blood sugar, chest pain Negative: Fever, chills  Physical Exam  There were no vitals taken for this visit. Gen:   Awake, no distress   Resp:  Normal effort  MSK:   Moves extremities without difficulty however generalized weakness  Other:  Patient altered however coherent  Medical Decision Making  Medically screening exam initiated at 4:11 PM.  Appropriate orders placed.  [name] was informed that the remainder of the evaluation will be completed by another provider, this initial triage assessment does not replace that evaluation, and the importance of remaining in the ED until their evaluation is complete.  Orders: CBC, CMP, CBG, UA, preg, VBG, Beta-hydroxybutyric acid, I stat chem 8, troponin, EKG   Alyssa Terrall FALCON, PA-C 05/07/24 1616

## 2024-05-08 ENCOUNTER — Encounter (HOSPITAL_COMMUNITY): Payer: Self-pay | Admitting: Internal Medicine

## 2024-05-08 DIAGNOSIS — E1110 Type 2 diabetes mellitus with ketoacidosis without coma: Secondary | ICD-10-CM | POA: Diagnosis not present

## 2024-05-08 LAB — CBC
HCT: 34.4 % — ABNORMAL LOW (ref 36.0–46.0)
Hemoglobin: 10.8 g/dL — ABNORMAL LOW (ref 12.0–15.0)
MCH: 25.7 pg — ABNORMAL LOW (ref 26.0–34.0)
MCHC: 31.4 g/dL (ref 30.0–36.0)
MCV: 81.9 fL (ref 80.0–100.0)
Platelets: 376 K/uL (ref 150–400)
RBC: 4.2 MIL/uL (ref 3.87–5.11)
RDW: 15.6 % — ABNORMAL HIGH (ref 11.5–15.5)
WBC: 14.8 K/uL — ABNORMAL HIGH (ref 4.0–10.5)
nRBC: 0 % (ref 0.0–0.2)

## 2024-05-08 LAB — GLUCOSE, CAPILLARY
Glucose-Capillary: 160 mg/dL — ABNORMAL HIGH (ref 70–99)
Glucose-Capillary: 176 mg/dL — ABNORMAL HIGH (ref 70–99)
Glucose-Capillary: 179 mg/dL — ABNORMAL HIGH (ref 70–99)
Glucose-Capillary: 186 mg/dL — ABNORMAL HIGH (ref 70–99)
Glucose-Capillary: 187 mg/dL — ABNORMAL HIGH (ref 70–99)
Glucose-Capillary: 191 mg/dL — ABNORMAL HIGH (ref 70–99)
Glucose-Capillary: 195 mg/dL — ABNORMAL HIGH (ref 70–99)
Glucose-Capillary: 199 mg/dL — ABNORMAL HIGH (ref 70–99)
Glucose-Capillary: 220 mg/dL — ABNORMAL HIGH (ref 70–99)
Glucose-Capillary: 226 mg/dL — ABNORMAL HIGH (ref 70–99)
Glucose-Capillary: 289 mg/dL — ABNORMAL HIGH (ref 70–99)

## 2024-05-08 LAB — BASIC METABOLIC PANEL WITH GFR
Anion gap: 13 (ref 5–15)
Anion gap: 12 (ref 5–15)
BUN: 16 mg/dL (ref 6–20)
BUN: 15 mg/dL (ref 6–20)
CO2: 23 mmol/L (ref 22–32)
CO2: 24 mmol/L (ref 22–32)
Calcium: 10 mg/dL (ref 8.9–10.3)
Calcium: 10.2 mg/dL (ref 8.9–10.3)
Chloride: 105 mmol/L (ref 98–111)
Chloride: 107 mmol/L (ref 98–111)
Creatinine, Ser: 0.72 mg/dL (ref 0.44–1.00)
Creatinine, Ser: 0.75 mg/dL (ref 0.44–1.00)
GFR, Estimated: >60 mL/min (ref >60)
GFR, Estimated: >60 mL/min (ref >60)
Glucose, Bld: 238 mg/dL — ABNORMAL HIGH (ref 70–99)
Glucose, Bld: 229 mg/dL — ABNORMAL HIGH (ref 70–99)
Potassium: 3.6 mmol/L (ref 3.5–5.1)
Potassium: 3.7 mmol/L (ref 3.5–5.1)
Sodium: 141 mmol/L (ref 135–145)
Sodium: 142 mmol/L (ref 135–145)

## 2024-05-08 LAB — BETA-HYDROXYBUTYRIC ACID
Beta-Hydroxybutyric Acid: 0.19 mmol/L (ref 0.05–0.27)
Beta-Hydroxybutyric Acid: 0.28 mmol/L — ABNORMAL HIGH (ref 0.05–0.27)

## 2024-05-08 LAB — MRSA NEXT GEN BY PCR, NASAL: MRSA by PCR Next Gen: NOT DETECTED

## 2024-05-08 MED ORDER — LISINOPRIL-HYDROCHLOROTHIAZIDE 20-25 MG PO TABS: 1.0000 | ORAL_TABLET | Freq: Every day | ORAL | Status: DC

## 2024-05-08 MED ORDER — NOVOLIN 70/30 RELION (70-30) 100 UNIT/ML ~~LOC~~ SUSP: 20.0000 [IU] | Freq: Two times a day (BID) | SUBCUTANEOUS | 0 refills | Status: AC

## 2024-05-08 MED ORDER — INSULIN GLARGINE-YFGN 100 UNIT/ML ~~LOC~~ SOPN: 20.0000 [IU] | PEN_INJECTOR | SUBCUTANEOUS | Status: DC

## 2024-05-08 MED ORDER — INSULIN GLARGINE-YFGN 100 UNIT/ML ~~LOC~~ SOLN: 20.0000 [IU] | Freq: Every day | SUBCUTANEOUS | Status: DC

## 2024-05-08 MED ORDER — INSULIN GLARGINE-YFGN 100 UNIT/ML ~~LOC~~ SOLN
20.0000 [IU] | Freq: Every day | SUBCUTANEOUS | Status: DC
  Filled 2024-05-08: qty 0.2

## 2024-05-08 MED ORDER — PANTOPRAZOLE SODIUM 40 MG PO TBEC
40.0000 mg | DELAYED_RELEASE_TABLET | Freq: Two times a day (BID) | ORAL | Status: DC
  Administered 2024-05-08: 40 mg via ORAL
  Filled 2024-05-08: qty 1

## 2024-05-08 MED ORDER — INSULIN ASPART 100 UNIT/ML IJ SOLN: 0.0000 [IU] | Freq: Every day | INTRAMUSCULAR | Status: DC

## 2024-05-08 MED ORDER — INSULIN ASPART 100 UNIT/ML IJ SOLN
0.0000 [IU] | Freq: Three times a day (TID) | INTRAMUSCULAR | Status: DC
  Administered 2024-05-08: 3 [IU] via SUBCUTANEOUS
  Administered 2024-05-08: 8 [IU] via SUBCUTANEOUS
  Filled 2024-05-08: qty 3
  Filled 2024-05-08: qty 8
  Filled 2024-05-08: qty 3

## 2024-05-08 MED ORDER — LISINOPRIL-HYDROCHLOROTHIAZIDE 20-25 MG PO TABS: 1.0000 | ORAL_TABLET | Freq: Every day | ORAL | 0 refills | Status: AC

## 2024-05-08 MED ORDER — HYDROCHLOROTHIAZIDE 25 MG PO TABS
25.0000 mg | ORAL_TABLET | Freq: Every day | ORAL | Status: DC
Start: 2024-05-08 — End: 2024-05-08
  Administered 2024-05-08: 25 mg via ORAL
  Filled 2024-05-08: qty 1

## 2024-05-08 MED ORDER — LISINOPRIL 20 MG PO TABS
20.0000 mg | ORAL_TABLET | Freq: Every day | ORAL | Status: DC
  Administered 2024-05-08: 20 mg via ORAL
  Filled 2024-05-08: qty 1

## 2024-05-08 MED ORDER — INSULIN GLARGINE-YFGN 100 UNIT/ML ~~LOC~~ SOLN
20.0000 [IU] | Freq: Once | SUBCUTANEOUS | Status: AC
  Administered 2024-05-08: 20 [IU] via SUBCUTANEOUS
  Filled 2024-05-08: qty 0.2

## 2024-05-08 NOTE — Progress Notes (Addendum)
 PROGRESS NOTE    [name]  FMW:[id number] DOB: 08/12/1975 DOA: 05/07/2024 PCP: Alyssa Headland, MD   Brief Narrative: 48 year old With PMH Diabetes not taking insulin , unable to afford it, Schizoaffective disorder, Depression, Bipolar, present nausea, vomiting, diarrhea, found to be in DKA>      Assessment & Plan:   Principal Problem:   DKA (diabetic ketoacidosis) (HCC) Active Problems:   Cannabis use disorder, moderate, dependence (HCC)   Diabetes mellitus (HCC)   Schizoaffective disorder, bipolar type (HCC)   Essential hypertension   1-DKA, Diabetes: Uncontrolled.  Not compliance with meds.  -Presents with CBG 719, bicarb 14,  -she was transition off insulin  gtt to Semglee . SSI.   Hyperkalemia; in setting DKA>  Resolved.   HTN:  -Resume HCTZ, Lisinopril .   Pseudohyponatremia; in setting hyperglycemia. resolved.   Bipolar, Schizoaffective disorder:  -Awaiting med rec  Leukocytosis; follow trend. UA with only 6-10 WBC    Estimated body mass index is 35.58 kg/m as calculated from the following:   Height as of this encounter: 5' 11 (1.803 m).   Weight as of this encounter: 115.7 kg.   DVT prophylaxis: Lovenox  Code Status: Full code Family Communication: Care discussed with patient.  Disposition Plan:  Status is: Inpatient Remains inpatient appropriate because: management tof DKA    Consultants:  none  Procedures:  None  Antimicrobials:    Subjective: Alert, still feeling nauseous.  No more diarrhea. She doesn't know what meds she takes for Bipolar.   Objective: Vitals:   05/08/24 0300 05/08/24 0400 05/08/24 0640 05/08/24 0700  BP: (!) 186/71 (!) 187/73 (!) 162/83 (!) 165/79  Pulse: 73 70 71 71  Resp: 18 16 13 15   Temp:  98.9 F (37.2 C)    TempSrc:  Oral    SpO2: 100% 100% 98% 100%  Weight:      Height:        Intake/Output Summary (Last 24 hours) at 05/08/2024 0742 Last data filed at 05/07/2024 2258 Gross per 24 hour  Intake  3762.34 ml  Output 800 ml  Net 2962.34 ml   Filed Weights   05/07/24 1730  Weight: 115.7 kg    Examination:  General exam: Appears calm and comfortable  Respiratory system: Clear to auscultation. Respiratory effort normal. Cardiovascular system: S1 & S2 heard, RRR. No JVD, murmurs, rubs, gallops or clicks. No pedal edema. Gastrointestinal system: Abdomen is nondistended, soft and nontender. No organomegaly or masses felt. Normal bowel sounds heard. Central nervous system: Alert and oriented. No focal neurological deficits. Extremities: Symmetric 5 x 5 power.   Data Reviewed: I have personally reviewed following labs and imaging studies  CBC: Recent Labs  Lab 05/07/24 1630 05/07/24 1639  WBC 17.9*  --   HGB 12.8 14.6  HCT 42.4 43.0  MCV 82.8  --   PLT 446*  --    Basic Metabolic Panel: Recent Labs  Lab 05/07/24 1630 05/07/24 1639 05/07/24 2000 05/08/24 0102 05/08/24 0335  NA 134* 132* 138 141 142  K 5.0 5.7* 3.9 3.6 3.7  CL 96* 105 103 105 107  CO2 14*  --  19* 23 24  GLUCOSE 719* 691* 393* 238* 229*  BUN 19 24* 17 16 15   CREATININE 1.04* 0.80 0.97 0.72 0.75  CALCIUM  10.7*  --  9.9 10.0 10.2   GFR: Estimated Creatinine Clearance: 123.2 mL/min (by C-G formula based on SCr of 0.75 mg/dL). Liver Function Tests: Recent Labs  Lab 05/07/24 1630  AST 18  ALT 24  ALKPHOS 117  BILITOT 1.1  PROT 8.7*  ALBUMIN 4.6   No results for input(s): LIPASE, AMYLASE in the last 168 hours. No results for input(s): AMMONIA in the last 168 hours. Coagulation Profile: No results for input(s): INR, PROTIME in the last 168 hours. Cardiac Enzymes: No results for input(s): CKTOTAL, CKMB, CKMBINDEX, TROPONINI in the last 168 hours. BNP (last 3 results) No results for input(s): PROBNP in the last 8760 hours. HbA1C: No results for input(s): HGBA1C in the last 72 hours. CBG: Recent Labs  Lab 05/08/24 0311 05/08/24 0402 05/08/24 0512 05/08/24 0615  05/08/24 0723  GLUCAP 191* 187* 176* 199* 186*   Lipid Profile: No results for input(s): CHOL, HDL, LDLCALC, TRIG, CHOLHDL, LDLDIRECT in the last 72 hours. Thyroid  Function Tests: No results for input(s): TSH, T4TOTAL, FREET4, T3FREE, THYROIDAB in the last 72 hours. Anemia Panel: No results for input(s): VITAMINB12, FOLATE, FERRITIN, TIBC, IRON, RETICCTPCT in the last 72 hours. Sepsis Labs: No results for input(s): PROCALCITON, LATICACIDVEN in the last 168 hours.  No results found for this or any previous visit (from the past 240 hours).       Radiology Studies: No results found.      Scheduled Meds:  Chlorhexidine  Gluconate Cloth  6 each Topical Daily   enoxaparin  (LOVENOX ) injection  40 mg Subcutaneous Q24H   insulin  aspart  0-15 Units Subcutaneous TID WC   insulin  aspart  0-5 Units Subcutaneous QHS   insulin  glargine-yfgn  20 Units Subcutaneous Once   Continuous Infusions:  sodium chloride  Stopped (05/07/24 1722)   dextrose  5% lactated ringers  100 mL/hr at 05/07/24 2258   insulin  8 Units/hr (05/08/24 0149)   lactated ringers  Stopped (05/07/24 2201)     LOS: 1 day    Time spent: 35 Minutes.     Owen Alyssa Lore, MD [facility]   If 7PM-7AM, please contact night-coverage [email or web address]  05/08/2024, 7:42 AM

## 2024-05-08 NOTE — Inpatient Diabetes Management (Incomplete)
 Inpatient Diabetes Program Recommendations  AACE/ADA: New Consensus Statement on Inpatient Glycemic Control (2015)  Target Ranges:  Prepandial:   less than 140 mg/dL      Peak postprandial:   less than 180 mg/dL (1-2 hours)      Critically ill patients:  140 - 180 mg/dL   Lab Results  Component Value Date   GLUCAP 160 (H) 05/08/2024   HGBA1C 10.9 (H) 12/11/2023    Review of Glycemic Control  Diabetes history: DM2 Outpatient Diabetes medications: 70/30 50 units BID Current orders for Inpatient glycemic control: Semglee  20 daily, Novolog  0-15 TID with meals and 0-5 HS  HgbA1C - 10.9%  Inpatient Diabetes Program Recommendations:

## 2024-05-08 NOTE — Discharge Summary (Signed)
 Physician Discharge Summary   Patient: [name] MRN: [id number] DOB: 05/01/1976  Admit date:     05/07/2024  Discharge date: 05/08/24  Discharge Physician: Owen [name]   PCP: Francyne Headland, MD   Recommendations at discharge:   Left AMA.  Insulin  sent to Pharmacy   Discharge Diagnoses: Principal Problem:   DKA (diabetic ketoacidosis) (HCC) Active Problems:   Cannabis use disorder, moderate, dependence (HCC)   Diabetes mellitus (HCC)   Schizoaffective disorder, bipolar type (HCC)   Essential hypertension  Resolved Problems:   * No resolved hospital problems. *  Hospital Course: 48 year old With PMH Diabetes not taking insulin , unable to afford it, Schizoaffective disorder, Depression, Bipolar, present nausea, vomiting, diarrhea, found to be in DKA>   Assessment and Plan: 1-DKA, Diabetes: Uncontrolled.  Not compliance with meds.  -Presents with CBG 719, bicarb 14,  She was transition to Semglee  and SSI.  Patient left AMA>   Hyperkalemia; in setting DKA>  Resolved.    HTN:  -Resume HCTZ, Lisinopril .    Pseudohyponatremia; in setting hyperglycemia. resolved.    Bipolar, Schizoaffective disorder:  -Awaiting med rec   Leukocytosis; follow trend. UA with only 6-10 WBC         Consultants:  Procedures performed:  Disposition: Home Diet recommendation:  Carb modified diet DISCHARGE MEDICATION: Left AMA  Discharge Exam: Filed Weights   05/07/24 1730  Weight: 115.7 kg     Condition at discharge: fair  The results of significant diagnostics from this hospitalization (including imaging, microbiology, ancillary and laboratory) are listed below for reference.   Imaging Studies: No results found.  Microbiology: Results for orders placed or performed during the hospital encounter of 05/07/24  MRSA Next Gen by PCR, Nasal     Status: None   Collection Time: 05/08/24  6:26 AM   Specimen: Nasal Mucosa; Nasal Swab  Result Value Ref Range Status    MRSA by PCR Next Gen NOT DETECTED NOT DETECTED Final    Comment: (NOTE) The GeneXpert MRSA Assay (FDA approved for NASAL specimens only), is one component of a comprehensive MRSA colonization surveillance program. It is not intended to diagnose MRSA infection nor to guide or monitor treatment for MRSA infections. Test performance is not FDA approved in patients less than 28 years old. Performed at [facility], [address] [place], [place] [address]     Labs: CBC: Recent Labs  Lab 05/07/24 1630 05/07/24 1639 05/08/24 0819  WBC 17.9*  --  14.8*  HGB 12.8 14.6 10.8*  HCT 42.4 43.0 34.4*  MCV 82.8  --  81.9  PLT 446*  --  376   Basic Metabolic Panel: Recent Labs  Lab 05/07/24 1630 05/07/24 1639 05/07/24 2000 05/08/24 0102 05/08/24 0335  NA 134* 132* 138 141 142  K 5.0 5.7* 3.9 3.6 3.7  CL 96* 105 103 105 107  CO2 14*  --  19* 23 24  GLUCOSE 719* 691* 393* 238* 229*  BUN 19 24* 17 16 15   CREATININE 1.04* 0.80 0.97 0.72 0.75  CALCIUM  10.7*  --  9.9 10.0 10.2   Liver Function Tests: Recent Labs  Lab 05/07/24 1630  AST 18  ALT 24  ALKPHOS 117  BILITOT 1.1  PROT 8.7*  ALBUMIN 4.6   CBG: Recent Labs  Lab 05/08/24 0615 05/08/24 0723 05/08/24 0818 05/08/24 0854 05/08/24 1213  GLUCAP 199* 186* 179* 160* 289*    Discharge time spent: greater than 30 minutes.  Signed: Taylynn Easton A Manasseh Pittsley,  MD [facility] 05/08/2024

## 2024-05-09 ENCOUNTER — Emergency Department (HOSPITAL_COMMUNITY)
Admission: EM | Admit: 2024-05-09 | Discharge: 2024-05-09 | Disposition: A | Discharge status: Home / Self Care | Attending: Emergency Medicine | Admitting: Emergency Medicine

## 2024-05-09 ENCOUNTER — Emergency Department (HOSPITAL_COMMUNITY)

## 2024-05-09 DIAGNOSIS — Z79899 Other long term (current) drug therapy: Secondary | ICD-10-CM | POA: Diagnosis not present

## 2024-05-09 DIAGNOSIS — E871 Hypo-osmolality and hyponatremia: Secondary | ICD-10-CM | POA: Insufficient documentation

## 2024-05-09 DIAGNOSIS — E1165 Type 2 diabetes mellitus with hyperglycemia: Secondary | ICD-10-CM | POA: Diagnosis not present

## 2024-05-09 DIAGNOSIS — B3731 Acute candidiasis of vulva and vagina: Secondary | ICD-10-CM | POA: Insufficient documentation

## 2024-05-09 DIAGNOSIS — I10 Essential (primary) hypertension: Secondary | ICD-10-CM | POA: Diagnosis not present

## 2024-05-09 DIAGNOSIS — R1013 Epigastric pain: Secondary | ICD-10-CM

## 2024-05-09 DIAGNOSIS — E876 Hypokalemia: Secondary | ICD-10-CM | POA: Diagnosis not present

## 2024-05-09 DIAGNOSIS — Z794 Long term (current) use of insulin: Secondary | ICD-10-CM | POA: Diagnosis not present

## 2024-05-09 DIAGNOSIS — R109 Unspecified abdominal pain: Secondary | ICD-10-CM | POA: Diagnosis present

## 2024-05-09 DIAGNOSIS — D649 Anemia, unspecified: Secondary | ICD-10-CM | POA: Diagnosis not present

## 2024-05-09 DIAGNOSIS — R112 Nausea with vomiting, unspecified: Secondary | ICD-10-CM

## 2024-05-09 LAB — CBC
HCT: 35.5 % — ABNORMAL LOW (ref 36.0–46.0)
Hemoglobin: 11.2 g/dL — ABNORMAL LOW (ref 12.0–15.0)
MCH: 25.6 pg — ABNORMAL LOW (ref 26.0–34.0)
MCHC: 31.5 g/dL (ref 30.0–36.0)
MCV: 81.1 fL (ref 80.0–100.0)
Platelets: 343 K/uL (ref 150–400)
RBC: 4.38 MIL/uL (ref 3.87–5.11)
RDW: 15.4 % (ref 11.5–15.5)
WBC: 9.8 K/uL (ref 4.0–10.5)
nRBC: 0 % (ref 0.0–0.2)

## 2024-05-09 LAB — BASIC METABOLIC PANEL WITH GFR
Anion gap: 15 (ref 5–15)
BUN: 12 mg/dL (ref 6–20)
CO2: 21 mmol/L — ABNORMAL LOW (ref 22–32)
Calcium: 9.4 mg/dL (ref 8.9–10.3)
Chloride: 97 mmol/L — ABNORMAL LOW (ref 98–111)
Creatinine, Ser: 0.72 mg/dL (ref 0.44–1.00)
GFR, Estimated: >60 mL/min (ref >60)
Glucose, Bld: 307 mg/dL — ABNORMAL HIGH (ref 70–99)
Potassium: 3.4 mmol/L — ABNORMAL LOW (ref 3.5–5.1)
Sodium: 133 mmol/L — ABNORMAL LOW (ref 135–145)

## 2024-05-09 LAB — BLOOD GAS, VENOUS
Acid-base deficit: 3.3 mmol/L — ABNORMAL HIGH (ref 0.0–2.0)
Bicarbonate: 21.3 mmol/L (ref 20.0–28.0)
O2 Saturation: 55.9 %
Patient temperature: 37
pCO2, Ven: 36 mmHg — ABNORMAL LOW (ref 44–60)
pH, Ven: 7.38 (ref 7.25–7.43)
pO2, Ven: 33 mmHg (ref 32–45)

## 2024-05-09 LAB — HIV ANTIBODY (ROUTINE TESTING W REFLEX): HIV Screen 4th Generation wRfx: NONREACTIVE

## 2024-05-09 LAB — BETA-HYDROXYBUTYRIC ACID: Beta-Hydroxybutyric Acid: 3.76 mmol/L — ABNORMAL HIGH (ref 0.05–0.27)

## 2024-05-09 LAB — CBG MONITORING, ED
Glucose-Capillary: 339 mg/dL — ABNORMAL HIGH (ref 70–99)
Glucose-Capillary: 256 mg/dL — ABNORMAL HIGH (ref 70–99)

## 2024-05-09 MED ORDER — ONDANSETRON 4 MG PO TBDP
4.0000 mg | ORAL_TABLET | Freq: Once | ORAL | Status: AC
  Administered 2024-05-09: 4 mg via ORAL
  Filled 2024-05-09: qty 1

## 2024-05-09 MED ORDER — MORPHINE SULFATE (PF) 4 MG/ML IV SOLN
4.0000 mg | Freq: Once | INTRAVENOUS | Status: AC
  Administered 2024-05-09: 4 mg via INTRAVENOUS
  Filled 2024-05-09: qty 1

## 2024-05-09 MED ORDER — ONDANSETRON 4 MG PO TBDP: 4.0000 mg | ORAL_TABLET | Freq: Three times a day (TID) | ORAL | 0 refills | Status: AC | PRN

## 2024-05-09 MED ORDER — PANTOPRAZOLE SODIUM 40 MG IV SOLR
40.0000 mg | Freq: Once | INTRAVENOUS | Status: AC
  Administered 2024-05-09: 40 mg via INTRAVENOUS
  Filled 2024-05-09: qty 10

## 2024-05-09 MED ORDER — SODIUM CHLORIDE 0.9 % IV BOLUS
1000.0000 mL | Freq: Once | INTRAVENOUS | Status: AC
  Administered 2024-05-09: 1000 mL via INTRAVENOUS

## 2024-05-09 MED ORDER — IOHEXOL 300 MG/ML  SOLN
100.0000 mL | Freq: Once | INTRAMUSCULAR | Status: AC | PRN
  Administered 2024-05-09: 100 mL via INTRAVENOUS

## 2024-05-09 MED ORDER — MORPHINE SULFATE (PF) 2 MG/ML IV SOLN
2.0000 mg | Freq: Once | INTRAVENOUS | Status: AC
  Administered 2024-05-09: 2 mg via INTRAVENOUS
  Filled 2024-05-09: qty 1

## 2024-05-09 MED ORDER — PANTOPRAZOLE SODIUM 40 MG PO TBEC: 40.0000 mg | DELAYED_RELEASE_TABLET | Freq: Every day | ORAL | 0 refills | Status: AC

## 2024-05-09 MED ORDER — CLOTRIMAZOLE 1 % EX CREA: TOPICAL_CREAM | CUTANEOUS | 0 refills | Status: AC

## 2024-05-09 MED ORDER — ONDANSETRON HCL 4 MG/2ML IJ SOLN
4.0000 mg | Freq: Once | INTRAMUSCULAR | Status: AC
  Administered 2024-05-09: 4 mg via INTRAVENOUS
  Filled 2024-05-09: qty 2

## 2024-05-09 NOTE — Discharge Instructions (Addendum)
 As discussed, please follow-up with the Department of Public Health pending your vaginal swabs today.  Please also follow-up with your primary care provider.  Seek emergency care if experiencing any new or worsening symptoms.

## 2024-05-09 NOTE — ED Provider Notes (Addendum)
 Accepted handoff at shift change from Prosperi PA-C. Please see prior provider note for more detail.   Briefly: Patient is 48 y.o. presents concern for hyperglycemia. Nausea vomiting for 4 days. She reports she took 20 units of insulin  this morning without eating. She reports that she left AGAINST MEDICAL ADVICE from the hospital on the fourth when she was admitted for DKA. She endorses epigastric abdominal pain, nausea, vomiting on arrival. She denies any chest pain, shortness of breath. CBG on arrival 339.  Plan:  - dispo pending labs and imaging. Pain is located in epigastric area.  - CBC without leukocytosis.  There is mild anemia with hemoglobin 11.2.  BMP with mild hyponatremia at 133, mild hypokalemia 3.4, mildly low CO2 at 21, mildly low chloride at 97.  Also hyperglycemia at 317 which resolved to 256 after IV fluids.  pH on VBG not concerning for acidosis.  No anion gap on BMP.  Beta hydroxybutyrate acid is elevated at 3.76 - but other lab tests and physical exam are not concerning for DKA at this time. - CT scan without acute process at this time.   - after the CT scan, patient requesting to be tested for yeast as she is having some erythema in her genital area and has had yeast present the same way in the past.  Denies vaginal discharge. We collected wet prep and gc/chlamydia and educated patient that she will need to follow up with the department of public health for this complaint. I will go ahead and prescribe patient clotrimazole for the vaginal candidiasis. Also recommended patient follow up with PCP. Patient agreeable with plan and feels safe with discharge.  - patient tolerating PO without difficulties. Given epigastric pain, I will start patient on Protonix . Will also prescribe Zofran . - staffed with Dr. Cottie who agrees with plan. - Patient afebrile with stable vitals.  Provided with return precautions.  Discharged in good condition.      Hoy Nidia FALCON, [place] 05/09/24  2051    Cottie Donnice PARAS, MD 05/09/24 2114

## 2024-05-09 NOTE — ED Provider Notes (Signed)
 [place] EMERGENCY DEPARTMENT AT [facility] Provider Note   CSN: [id number] Arrival date & time: 05/09/24  1240     Patient presents with: Abdominal Pain   [name] is a 48 y.o. female with past medical history significant for diabetes, schizoaffective disorder, bipolar disorder, hypertension, depression who presents concern for hyperglycemia.  Nausea vomiting for 4 days.  She reports she took 20 units of insulin  this morning without eating.  She reports that she left AGAINST MEDICAL ADVICE from the hospital on the fourth when she was admitted for DKA.  She endorses epigastric abdominal pain, nausea, vomiting on arrival.  She denies any chest pain, shortness of breath.  CBG on arrival 339.    Abdominal Pain      Prior to Admission medications   Medication Sig Start Date End Date Taking? Authorizing Provider  benztropine  (COGENTIN ) 1 MG tablet Take 1 tablet (1 mg total) by mouth 2 (two) times daily. Patient taking differently: Take 1 mg by mouth at bedtime. 12/21/22   Tanda Bleacher, MD  CLARITIN 10 MG tablet Take 10 mg by mouth daily as needed for allergies, rhinitis or itching.    [provider]  ferrous sulfate  325 (65 FE) MG tablet Take 1 tablet (325 mg total) by mouth daily with breakfast. 02/16/23   Tanda Bleacher, MD  haloperidol  (HALDOL ) 5 MG tablet Take 1 tablet (5 mg total) by mouth 2 (two) times daily. Patient taking differently: Take 5-10 mg by mouth at bedtime. 12/13/23   Ghimire, Donalda HERO, MD  haloperidol  decanoate (HALDOL  DECANOATE) 100 MG/ML injection Inject 100 mg into the muscle every 28 (twenty-eight) days. 10/19/23   [provider]  insulin  NPH-regular Human (NOVOLIN  70/30 RELION) (70-30) 100 UNIT/ML injection Inject 20 Units into the skin in the morning and at bedtime. 05/08/24   Regalado, Belkys A, MD  Insulin  Pen Needle (PEN NEEDLES) 31G X 5 MM MISC 1 each by Does not apply route daily. 10/02/23   Crain, Whitney L, PA  Lancet Device  MISC 1 each by Does not apply route 3 (three) times daily. May dispense any manufacturer covered by patient's insurance. Patient not taking: Reported on 01/24/2024 12/21/22   Tanda Bleacher, MD  lisinopril -hydrochlorothiazide  (ZESTORETIC ) 20-25 MG tablet Take 1 tablet by mouth daily. 05/08/24   Regalado, Belkys A, MD  PARoxetine  (PAXIL ) 20 MG tablet Take 1 tablet (20 mg total) by mouth daily. Patient taking differently: Take 20 mg by mouth at bedtime. 12/21/22   Tanda Bleacher, MD  thiamine  (VITAMIN B-1) 100 MG tablet Take 1 tablet (100 mg total) by mouth daily. Patient not taking: Reported on 01/22/2024 12/14/23   Raenelle Donalda HERO, MD  traZODone  (DESYREL ) 50 MG tablet Take 50 mg by mouth at bedtime as needed for sleep.    [provider]  VITAMIN E PO Take 1 capsule by mouth daily.    [provider]    Allergies: Patient has no known allergies.    Review of Systems  Gastrointestinal:  Positive for abdominal pain.  All other systems reviewed and are negative.   Updated Vital Signs BP (!) 170/96 (BP Location: Left Arm)   Pulse 86   Temp 97.8 F (36.6 C) (Oral)   Resp 18   SpO2 100%   Physical Exam Vitals and nursing note reviewed.  Constitutional:      General: She is not in acute distress.    Appearance: Normal appearance.  HENT:     Head: Normocephalic and atraumatic.  Eyes:     General:        Right eye: No discharge.        Left eye: No discharge.  Cardiovascular:     Rate and Rhythm: Normal rate and regular rhythm.     Heart sounds: No murmur heard.    No friction rub. No gallop.  Pulmonary:     Effort: Pulmonary effort is normal.     Breath sounds: Normal breath sounds.  Abdominal:     General: Bowel sounds are normal.     Palpations: Abdomen is soft.     Comments: diffuse tenderness to palpation throughout the abdomen, no rebound, rigidity, guarding.  Skin:    General: Skin is warm and dry.     Capillary Refill: Capillary refill takes less than 2  seconds.  Neurological:     Mental Status: She is alert and oriented to person, place, and time.  Psychiatric:        Mood and Affect: Mood normal.        Behavior: Behavior normal.     (all labs ordered are listed, but only abnormal results are displayed) Labs Reviewed  CBC - Abnormal; Notable for the following components:      Result Value   Hemoglobin 11.2 (*)    HCT 35.5 (*)    MCH 25.6 (*)    All other components within normal limits  CBG MONITORING, ED - Abnormal; Notable for the following components:   Glucose-Capillary 339 (*)    All other components within normal limits  BASIC METABOLIC PANEL WITH GFR  BETA-HYDROXYBUTYRIC ACID  BLOOD GAS, VENOUS    EKG: None  Radiology: No results found.   Procedures   Medications Ordered in the ED  morphine  (PF) 4 MG/ML injection 4 mg (4 mg Intravenous Given 05/09/24 1425)  ondansetron  (ZOFRAN ) injection 4 mg (4 mg Intravenous Given 05/09/24 1426)  sodium chloride  0.9 % bolus 1,000 mL (1,000 mLs Intravenous New Bag/Given 05/09/24 1429)                                    Medical Decision Making Amount and/or Complexity of Data Reviewed Labs: ordered.  Risk Prescription drug management.   This patient is a 48 y.o. female  who presents to the ED for concern of hyperglycemia, nausea, vomiting.   Differential diagnoses prior to evaluation: The emergent differential diagnosis includes, but is not limited to, DKA, hyperglycemia, cyclic vomiting, gastroenteritis, gastritis, versus other. This is not an exhaustive differential.   Past Medical History / Co-morbidities / Social History: diabetes, schizoaffective disorder, bipolar disorder, hypertension, depression  Additional history: Chart reviewed. Pertinent results include: reviewed labwork, imaging from previous ed visits, hospitalizations  Physical Exam: Physical exam performed. The pertinent findings include: Hypertension arrival, pressure 170/96, diffuse abdominal  pain with no rebound or rigidity, guarding.  Lab Tests/Imaging studies: I personally interpreted labs/imaging and the pertinent results include: Initial CBG 339, CBC with mild anemia, hemoglobin 11.2, BMP vbg, BHB pending at time of handoff.  Cardiac monitoring: EKG obtained and interpreted by myself and attending physician which shows: Normal sinus rhythm, no acute ST -T changes   Medications: I ordered medication including Zofran , morphine , fluid bolus.  I have reviewed the patients home medicines and have made adjustments as needed.   3:05 PM Care of [name] transferred to [facility] and Dr. Cottie at the end of my shift as the  patient will require reassessment once labs/imaging have resulted. Patient presentation, ED course, and plan of care discussed with review of all pertinent labs and imaging. Please see his/her note for further details regarding further ED course and disposition. Plan at time of handoff is reassess after nausea, pain medication, fluids, assess whether patient is still in DKA, if not likely okay for insulin , reassessment, discharge. This may be altered or completely changed at the discretion of the oncoming team pending results of further workup.   Final diagnoses:  None    ED Discharge Orders     None          [name] 05/09/24 1505    Garrick Charleston, MD 05/09/24 1614

## 2024-05-09 NOTE — ED Notes (Signed)
 Patient resting in bed talking on her cell phone

## 2024-05-09 NOTE — ED Triage Notes (Signed)
 [facility] N/V for 4 days. Took 20 units of insulin  this morning. Left AMA from the hospital on the 4th to get to her court date.  CBG 397

## 2024-05-09 NOTE — ED Notes (Signed)
 Patient d/c with home care instructions. VS Obtained. IV discontinued.

## 2024-05-09 NOTE — ED Notes (Signed)
 Pt states that she has a yeast infection due to her blood sugar getting too high.

## 2024-05-12 LAB — GC/CHLAMYDIA PROBE AMP (~~LOC~~) NOT AT ARMC
Chlamydia: NEGATIVE
Comment: NEGATIVE
Comment: NORMAL
Neisseria Gonorrhea: NEGATIVE

## 2024-07-04 ENCOUNTER — Other Ambulatory Visit: Payer: Self-pay | Admitting: Urgent Care

## 2024-07-04 NOTE — Telephone Encounter (Signed)
 Copied from CRM [id number]. Topic: Clinical - Medication Refill >> Jul 04, 2024 12:52 PM Kevelyn M wrote: Medication: insulin  NPH-regular Human (NOVOLIN  70/30 RELION) (70-30) 100 UNIT/ML injection  Has the patient contacted their pharmacy? Yes (Agent: If no, request that the patient contact the pharmacy for the refill. If patient does not wish to contact the pharmacy document the reason why and proceed with request.) (Agent: If yes, when and what did the pharmacy advise?)  This is the patient's preferred pharmacy:  [facility] [address], [place] - [id number] [address], [place], [place] [address] [phone]  Is this the correct pharmacy for this prescription? Yes If no, delete pharmacy and type the correct one.   Has the prescription been filled recently? no  Is the patient out of the medication? Yes  Has the patient been seen for an appointment in the last year OR does the patient have an upcoming appointment? Yes  Can we respond through MyChart? Yes  Agent: Please be advised that Rx refills may take up to 3 business days. We ask that you follow-up with your pharmacy.

## 2024-07-07 NOTE — Telephone Encounter (Signed)
 Can we please contact pt - it looks like this patient has another PCP. This is an [facility] patient that I believe I saw only one time. Pt will need an appointment if she is needing refills from me.

## 2024-07-07 NOTE — Telephone Encounter (Signed)
Tried to reach patient. Voicemail is full °
